# Patient Record
Sex: Male | Born: 1950 | Race: Black or African American | Hispanic: No | Marital: Single | Smoking: Former smoker
Health system: Southern US, Community
[De-identification: ages and names within clinical notes are randomized; demographics above are authoritative.]

## PROBLEM LIST (undated history)

## (undated) DIAGNOSIS — K759 Inflammatory liver disease, unspecified: Secondary | ICD-10-CM

## (undated) DIAGNOSIS — H269 Unspecified cataract: Secondary | ICD-10-CM

## (undated) DIAGNOSIS — F329 Major depressive disorder, single episode, unspecified: Secondary | ICD-10-CM

## (undated) DIAGNOSIS — F419 Anxiety disorder, unspecified: Secondary | ICD-10-CM

## (undated) DIAGNOSIS — K7460 Unspecified cirrhosis of liver: Secondary | ICD-10-CM

## (undated) DIAGNOSIS — H547 Unspecified visual loss: Secondary | ICD-10-CM

## (undated) DIAGNOSIS — F1010 Alcohol abuse, uncomplicated: Secondary | ICD-10-CM

## (undated) DIAGNOSIS — R569 Unspecified convulsions: Secondary | ICD-10-CM

## (undated) DIAGNOSIS — F32A Depression, unspecified: Secondary | ICD-10-CM

## (undated) DIAGNOSIS — I10 Essential (primary) hypertension: Secondary | ICD-10-CM

## (undated) DIAGNOSIS — T7840XA Allergy, unspecified, initial encounter: Secondary | ICD-10-CM

## (undated) DIAGNOSIS — H409 Unspecified glaucoma: Secondary | ICD-10-CM

## (undated) DIAGNOSIS — M1990 Unspecified osteoarthritis, unspecified site: Secondary | ICD-10-CM

## (undated) DIAGNOSIS — D649 Anemia, unspecified: Secondary | ICD-10-CM

## (undated) DIAGNOSIS — H5440 Blindness, one eye, unspecified eye: Secondary | ICD-10-CM

## (undated) HISTORY — PX: GLAUCOMA SURGERY: SHX656

## (undated) HISTORY — DX: Depression, unspecified: F32.A

## (undated) HISTORY — DX: Anxiety disorder, unspecified: F41.9

## (undated) HISTORY — DX: Unspecified convulsions: R56.9

## (undated) HISTORY — DX: Anemia, unspecified: D64.9

## (undated) HISTORY — DX: Allergy, unspecified, initial encounter: T78.40XA

## (undated) HISTORY — DX: Unspecified glaucoma: H40.9

## (undated) HISTORY — DX: Unspecified visual loss: H54.7

## (undated) HISTORY — PX: CATARACT EXTRACTION: SUR2

## (undated) HISTORY — DX: Unspecified osteoarthritis, unspecified site: M19.90

## (undated) HISTORY — DX: Unspecified cataract: H26.9

---
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
## 2014-12-09 ENCOUNTER — Other Ambulatory Visit: Payer: No Typology Code available for payment source

## 2014-12-09 DIAGNOSIS — B182 Chronic viral hepatitis C: Secondary | ICD-10-CM

## 2014-12-09 LAB — HEPATITIS A ANTIBODY, TOTAL: HEP A TOTAL AB: REACTIVE — AB

## 2014-12-09 LAB — CBC WITH DIFFERENTIAL/PLATELET
Basophils Absolute: 0 10*3/uL (ref 0.0–0.1)
Basophils Relative: 1 % (ref 0–1)
EOS PCT: 3 % (ref 0–5)
Eosinophils Absolute: 0.1 10*3/uL (ref 0.0–0.7)
HCT: 40.9 % (ref 39.0–52.0)
HEMOGLOBIN: 13.8 g/dL (ref 13.0–17.0)
LYMPHS PCT: 53 % — AB (ref 12–46)
Lymphs Abs: 2.5 10*3/uL (ref 0.7–4.0)
MCH: 29 pg (ref 26.0–34.0)
MCHC: 33.7 g/dL (ref 30.0–36.0)
MCV: 85.9 fL (ref 78.0–100.0)
MONO ABS: 0.6 10*3/uL (ref 0.1–1.0)
MPV: 10.8 fL (ref 9.4–12.4)
Monocytes Relative: 12 % (ref 3–12)
Neutro Abs: 1.5 10*3/uL — ABNORMAL LOW (ref 1.7–7.7)
Neutrophils Relative %: 31 % — ABNORMAL LOW (ref 43–77)
Platelets: 205 10*3/uL (ref 150–400)
RBC: 4.76 MIL/uL (ref 4.22–5.81)
RDW: 14.1 % (ref 11.5–15.5)
WBC: 4.7 10*3/uL (ref 4.0–10.5)

## 2014-12-09 LAB — COMPREHENSIVE METABOLIC PANEL
ALBUMIN: 4 g/dL (ref 3.5–5.2)
ALT: 186 U/L — ABNORMAL HIGH (ref 0–53)
AST: 137 U/L — ABNORMAL HIGH (ref 0–37)
Alkaline Phosphatase: 81 U/L (ref 39–117)
BUN: 9 mg/dL (ref 6–23)
CHLORIDE: 104 meq/L (ref 96–112)
CO2: 28 mEq/L (ref 19–32)
CREATININE: 1.01 mg/dL (ref 0.50–1.35)
Calcium: 9.5 mg/dL (ref 8.4–10.5)
Glucose, Bld: 100 mg/dL — ABNORMAL HIGH (ref 70–99)
Potassium: 4 mEq/L (ref 3.5–5.3)
Sodium: 140 mEq/L (ref 135–145)
Total Bilirubin: 0.5 mg/dL (ref 0.2–1.2)
Total Protein: 7.1 g/dL (ref 6.0–8.3)

## 2014-12-09 LAB — HIV ANTIBODY (ROUTINE TESTING W REFLEX): HIV 1&2 Ab, 4th Generation: NONREACTIVE

## 2014-12-09 LAB — HEPATITIS B SURFACE ANTIGEN: Hepatitis B Surface Ag: NEGATIVE

## 2014-12-09 LAB — HEPATITIS B CORE ANTIBODY, TOTAL: Hep B Core Total Ab: REACTIVE — AB

## 2014-12-09 LAB — HEPATITIS B SURFACE ANTIBODY,QUALITATIVE: Hep B S Ab: NEGATIVE

## 2014-12-09 LAB — PROTIME-INR
INR: 1.04 (ref <1.50)
PROTHROMBIN TIME: 13.6 s (ref 11.6–15.2)

## 2014-12-09 LAB — IRON: Iron: 157 ug/dL (ref 42–165)

## 2014-12-10 LAB — ANA: Anti Nuclear Antibody(ANA): NEGATIVE

## 2015-01-05 ENCOUNTER — Ambulatory Visit (INDEPENDENT_AMBULATORY_CARE_PROVIDER_SITE_OTHER): Payer: No Typology Code available for payment source | Admitting: Internal Medicine

## 2015-01-05 VITALS — BP 199/96 | HR 82 | Temp 98.0°F | Wt 170.0 lb

## 2015-01-05 DIAGNOSIS — B182 Chronic viral hepatitis C: Secondary | ICD-10-CM

## 2015-01-05 DIAGNOSIS — I10 Essential (primary) hypertension: Secondary | ICD-10-CM | POA: Insufficient documentation

## 2015-01-05 NOTE — Progress Notes (Addendum)
+  Jeffrey Mullen is a 64 y.o. male who presents for initial evaluation and management of a positive Hepatitis C antibody test.  Patient tested positive in 2006. Hepatitis C risk factors present are: sexual contact with person with liver disease (details: "risky" sexual behavior in teens and 42s). Patient denies intranasal drug use, IV drug abuse, tattoos. Patient has had other studies performed. Results: hepatitis C RNA by PCR, result: positive. Patient has not had prior treatment for Hepatitis C. Patient does not have a past history of liver disease. Patient does not have a family history of liver disease.   HPI: He has no liver issues now.  Had labs done by [facility] for genotype and viral load and paper copy reviewed.   Patient does have documented immunity to Hepatitis A. Patient does have documented immunity to Hepatitis B.     Review of Systems A comprehensive review of systems was negative.   No past medical history on file.  Prior to Admission medications   Medication Sig Start Date End Date Taking? Authorizing Provider  amLODipine (NORVASC) 10 MG tablet Take 10 mg by mouth daily.   Yes Historical Provider, MD  lisinopril-hydrochlorothiazide (PRINZIDE,ZESTORETIC) 20-12.5 MG per tablet Take 1 tablet by mouth daily.   Yes Historical Provider, MD    Allergies  Allergen Reactions  . Acetaminophen Other (See Comments)    Causes eye pressure to elevate    History  Substance Use Topics  . Smoking status: Not on file  . Smokeless tobacco: Not on file  . Alcohol Use: Not on file    No family history on file.    Objective:   Filed Vitals:   01/05/15 1505  BP: 199/96  Pulse: 82  Temp: 98 F (36.7 C)   in no apparent distress and alert HEENT: anicteric Cor RRR and No murmurs clear Bowel sounds are normal, liver is not enlarged, spleen is not enlarged peripheral pulses normal, no pedal edema, no clubbing or cyanosis negative for - jaundice, spider hemangioma, telangiectasia,  palmar erythema, ecchymosis and atrophy  Laboratory Genotype: No results found for: HCVGENOTYPE HCV viral load: No results found for: HCVQUANT Lab Results  Component Value Date   WBC 4.7 12/09/2014   HGB 13.8 12/09/2014   HCT 40.9 12/09/2014   MCV 85.9 12/09/2014   PLT 205 12/09/2014    Lab Results  Component Value Date   CREATININE 1.01 12/09/2014   BUN 9 12/09/2014   NA 140 12/09/2014   K 4.0 12/09/2014   CL 104 12/09/2014   CO2 28 12/09/2014    Lab Results  Component Value Date   ALT 186* 12/09/2014   AST 137* 12/09/2014   ALKPHOS 81 12/09/2014   BILITOT 0.5 12/09/2014   INR 1.04 12/09/2014      Assessment: Hepatitis C genotype 1a  Plan: 1) Patient counseled extensively on limiting acetaminophen to no more than 2 grams daily, avoidance of alcohol. 2) Transmission discussed with patient including sexual transmission, sharing razors and toothbrush.   3) Will need referral to gastroenterology if concern for cirrhosis 4) Will need referral for substance abuse counseling: No. 5) Will prescribe Harvoni for 12 weeks once work up complete 6) Hepatitis A vaccine No. 7) Hepatitis B vaccine No. 8) Pneumovax vaccine if concern for cirrhosis 9) will follow up after elastography

## 2015-01-05 NOTE — Patient Instructions (Signed)
Date 01/05/2015  Dear Mr Vermeer, As discussed in the [facility], your hepatitis C therapy will include the following medications:          Harvoni 90mg /400mg  tablet:           Take 1 tablet by mouth once daily   Please note that ALL MEDICATIONS WILL START ON THE SAME DATE for a total of 12 weeks. ---------------------------------------------------------------- Your HCV Treatment Start Date: TBA   Your HCV genotype:  1a    Liver Fibrosis: TBD    ---------------------------------------------------------------- YOUR PHARMACY CONTACT:   [place] [facility] [place] Phone: [phone] Hours: Monday to Friday 7:30 am to 6:00 pm   Please always contact your pharmacy at least 3-4 business days before you run out of medications to ensure your next month's medication is ready or 1 week prior to running out if you receive it by mail.  Remember, each prescription is for 28 days. ---------------------------------------------------------------- GENERAL NOTES REGARDING YOUR HEPATITIS C MEDICATION:  SOFOSBUVIR/LEDIPASVIR (HARVONI): - Harvoni tablet is taken daily with OR without food. - The tablets are orange. - The tablets should be stored at room temperature.  - Acid reducing agents such as H2 blockers (ie. Pepcid (famotidine), Zantac (ranitidine), Tagamet (cimetidine), Axid (nizatidine) and proton pump inhibitors (ie. Prilosec (omeprazole), Protonix (pantoprazole), Nexium (esomeprazole), or Aciphex (rabeprazole)) can decrease effectiveness of Harvoni. Do not take until you have discussed with a health care provider.    -Antacids that contain magnesium and/or aluminum hydroxide (ie. Milk of Magensia, Rolaids, Gaviscon, Maalox, Mylanta, an dArthritis Pain Formula)can reduce absorption of Harvoni, so take them at least 4 hours before or after Harvoni.  -Calcium carbonate (calcium supplements or antacids such as Tums, Caltrate,  Os-Cal)needs to be taken at least 4 hours hours before or after Harvoni.  -St. John's wort or any products that contain St. John's wort like some herbal supplements  Please inform the office prior to starting any of these medications.  - The common side effects with Harvoni:      1. Fatigue      2. Headache      3. Nausea      4. Diarrhea      5. Insomnia   Support Path is a suite of resources designed to help patients start with HARVONI and move toward treatment completion [place] helps patients access therapy and get off to an efficient start  Benefits investigation and prior authorization support Co-pay and other financial assistance A specialty pharmacy finder CO-PAY COUPON The [place] co-pay coupon may help eligible patients lower their out-of-pocket costs. With a co-pay coupon, most eligible patients may pay no more than $5 per co-pay (restrictions apply) www.harvoni.com call 619-298-9372 Not valid for patients enrolled in government healthcare prescription drug programs, such as Medicare Part D and Medicaid. Patients in the coverage gap known as the "donut hole" also are not eligible The HARVONI co-pay coupon program will cover the out-of-pocket costs for HARVONI prescriptions up to a maximum of 25% of the catalog price of a 12-week regimen of HARVONI  Please note that this only lists the most common side effects and is NOT a comprehensive list of the potential side effects of these medications. For more information, please review the drug information sheets that come with your medication package from the pharmacy.  ---------------------------------------------------------------- GENERAL HELPFUL HINTS ON HCV THERAPY: 1. No alcohol. 2. Protect against sun-sensitivity/sunburns (wear sunglasses, hat, long sleeves, pants  and sunscreen). 3. Stay well-hydrated/well-moisturized. 4. Notify the [facility] of any changes in your other over-the-counter/herbal or  prescription medications. 5. If you miss a dose of your medication, take the missed dose as soon as you remember. Return to your regular time/dose schedule the next day.  6.  Do not stop taking your medications without first talking with your healthcare provider. 7.  You may take Tylenol (acetaminophen), as long as the dose is less than 2000 mg (OR no more than 4 tablets of the Tylenol Extra Strengths 500mg  tablet) in 24 hours. 8.  You will need to obtain routine labs and/or office visits at [facility] at weeks 2, 4, 8,  and 12 as well as 12 and 24 weeks after completion of treatment.   Scharlene Gloss, [place] for [place] [place], [place]  [phone]

## 2015-01-24 ENCOUNTER — Ambulatory Visit (HOSPITAL_COMMUNITY): Payer: No Typology Code available for payment source

## 2015-02-01 ENCOUNTER — Other Ambulatory Visit: Payer: Self-pay | Admitting: Internal Medicine

## 2015-02-01 ENCOUNTER — Ambulatory Visit (HOSPITAL_COMMUNITY)
Admission: RE | Admit: 2015-02-01 | Discharge: 2015-02-01 | Disposition: A | Discharge status: Home / Self Care | Payer: No Typology Code available for payment source | Source: Ambulatory Visit | Attending: Internal Medicine | Admitting: Internal Medicine

## 2015-02-01 DIAGNOSIS — K7689 Other specified diseases of liver: Secondary | ICD-10-CM | POA: Insufficient documentation

## 2015-02-01 DIAGNOSIS — B182 Chronic viral hepatitis C: Secondary | ICD-10-CM | POA: Insufficient documentation

## 2015-02-01 MED ORDER — LEDIPASVIR-SOFOSBUVIR 90-400 MG PO TABS: 1.0000 | ORAL_TABLET | Freq: Every day | ORAL | Status: DC

## 2015-02-10 ENCOUNTER — Encounter: Payer: Self-pay | Admitting: Internal Medicine

## 2015-02-10 ENCOUNTER — Ambulatory Visit (INDEPENDENT_AMBULATORY_CARE_PROVIDER_SITE_OTHER): Payer: No Typology Code available for payment source | Admitting: Internal Medicine

## 2015-02-10 VITALS — BP 135/78 | HR 89 | Temp 98.3°F | Wt 172.0 lb

## 2015-02-10 DIAGNOSIS — B182 Chronic viral hepatitis C: Secondary | ICD-10-CM

## 2015-02-10 DIAGNOSIS — K7460 Unspecified cirrhosis of liver: Secondary | ICD-10-CM

## 2015-02-10 NOTE — Progress Notes (Signed)
   Subjective:    Patient ID: Jeffrey Mullen, male    DOB: 1951/03/05, 64 y.o.   MRN: [id number]  HPI  he is here for follow-up of hepatitis C. He had labs done by his outpatient clinic and has genotype 1A with an active viral load. He recently had his elastography and is F3 to F4. Harvoni has been sent through the drug assistance program to the [facility]. Awaiting for approval.   Review of Systems  Constitutional: Negative for fatigue.  Gastrointestinal: Negative for nausea and diarrhea.  Skin: Negative for rash.  Neurological: Negative for dizziness and light-headedness.       Objective:   Physical Exam  Constitutional: He appears well-developed and well-nourished. No distress.  Eyes: No scleral icterus.  Cardiovascular: Normal rate, regular rhythm and normal heart sounds.   No murmur heard. Pulmonary/Chest: Effort normal and breath sounds normal. No respiratory distress. He has no wheezes.  Lymphadenopathy:    He has no cervical adenopathy.  Skin: No rash noted.          Assessment & Plan:

## 2015-02-10 NOTE — Assessment & Plan Note (Signed)
He will need an ultrasound every 6 months and we'll consider elastography in one year. Will need gastroenterology follow-up if he is able to get insurance the future.

## 2015-02-10 NOTE — Assessment & Plan Note (Signed)
I'm awaiting for approval for Harvoni. He will return once this has been approved

## 2015-02-15 ENCOUNTER — Telehealth: Payer: Self-pay | Admitting: *Deleted

## 2015-02-15 NOTE — Telephone Encounter (Signed)
Received a statement for Jeffrey Mullen.  He has the [employer].  I called [facility] and had the charges removed and sent back here.

## 2015-03-31 ENCOUNTER — Ambulatory Visit: Payer: No Typology Code available for payment source

## 2015-05-26 ENCOUNTER — Other Ambulatory Visit: Payer: Self-pay | Admitting: Internal Medicine

## 2015-05-26 ENCOUNTER — Other Ambulatory Visit: Payer: No Typology Code available for payment source

## 2015-05-26 DIAGNOSIS — B182 Chronic viral hepatitis C: Secondary | ICD-10-CM

## 2015-05-31 LAB — HCV RNA NS5A DRUG RESISTANCE

## 2015-05-31 LAB — HEPATITIS C RNA QUANTITATIVE
HCV QUANT LOG: 6.74 {Log} — AB (ref <1.18)
HCV Quantitative: 5482903 IU/mL — ABNORMAL HIGH (ref <15)

## 2015-06-01 ENCOUNTER — Encounter: Payer: Self-pay | Admitting: Pharmacist Clinician (PhC)/ Clinical Pharmacy Specialist

## 2015-06-01 NOTE — Progress Notes (Signed)
Patient ID: Jeffrey Mullen, male   DOB: [birth year], 64 y.o.   MRN: [id number] Jeffrey Mullen stopped by today to sign his application for the hep C meds. We are going to try [employer] for [employer]. He is NS5A neg so he would only need the 12 weeks. App was submitted today.

## 2015-06-02 ENCOUNTER — Encounter: Payer: Self-pay | Admitting: Pharmacist

## 2015-06-02 NOTE — Progress Notes (Signed)
Patient ID: Jeffrey Mullen, male   DOB: [birth year], 64 y.o.   MRN: [id number]  Called the patient to let him know that he was approved for Harvoni. Discussed with his how to take the medication (once daily) and the importance of not missing any doses. Also reviewed side effects including headache and fatigue. Patient verbalized understanding and expressed great thanks. Patient was informed that he would be getting a phone call from the company and will receive the medication shortly after that. He is to start as soon as he receives his medication and will follow up for labs and in clinic per Dr. Linus Salmons.   Thank you for allowing pharmacy to be part of this patient's care team  Shalen Petrak M. Valeria Krisko, Pharm.D Clinical Pharmacy Resident Pager: [phone] 06/02/2015 .2:39 PM

## 2015-06-03 LAB — HEPATITIS C GENOTYPE

## 2016-08-13 ENCOUNTER — Encounter: Payer: Self-pay | Admitting: Pediatric Intensive Care

## 2016-08-13 DIAGNOSIS — Z139 Encounter for screening, unspecified: Secondary | ICD-10-CM

## 2016-08-20 NOTE — Congregational Nurse Program (Signed)
Congregational Nurse Program Note  Date of Encounter: 08/13/2016  Past Medical History: No past medical history on file.  Encounter Details:     CNP Questionnaire - 08/13/16 0800      Patient Demographics   Is this a new or existing patient? New   Patient is considered a/an Not Applicable   Race African-American/Black     Patient Assistance   Location of Patient Assistance [place]   Patient's financial/insurance status [employer]/Care Connects   Uninsured Patient Yes   Interventions Not Applicable   Patient referred to apply for the following financial assistance Not Applicable   Food insecurities addressed Not Applicable   Transportation assistance No   Assistance securing medications No   Educational health offerings Chronic disease;Hypertension;Medications     Encounter Details   Primary purpose of visit Chronic Illness/Condition Visit   Was an Emergency Department visit averted? Not Applicable   Does patient have a medical provider? Yes   Patient referred to Follow up with established PCP   Was a mental health screening completed? (GAINS tool) No   Does patient have dental issues? No   Does patient have vision issues? Yes   Was a vision referral made? No   Does your patient have an abnormal blood pressure today? No   Since previous encounter, have you referred patient for abnormal blood pressure that resulted in a new diagnosis or medication change? No   Does your patient have an abnormal blood glucose today? No   Since previous encounter, have you referred patient for abnormal blood glucose that resulted in a new diagnosis or medication change? No   Was there a life-saving intervention made? No     S:Client to clinic for BP check. States that he has been taking BP meds more consistently since he's been at facility and his life has stabilized. O: BP142/73 A: Normal BP P:advised client to keep follow up appointment with [facility] provider and to continue to take his  medication consistently. Follow up with CN for BP checks as needed.

## 2018-06-22 ENCOUNTER — Inpatient Hospital Stay (HOSPITAL_COMMUNITY)
Admission: EM | Admit: 2018-06-22 | Discharge: 2018-07-03 | DRG: 084 | Disposition: A | Discharge status: Home / Self Care | Payer: No Typology Code available for payment source | Attending: General Surgery | Admitting: General Surgery

## 2018-06-22 ENCOUNTER — Emergency Department (HOSPITAL_COMMUNITY): Payer: No Typology Code available for payment source

## 2018-06-22 ENCOUNTER — Encounter (HOSPITAL_COMMUNITY): Payer: Self-pay | Admitting: Emergency Medicine

## 2018-06-22 DIAGNOSIS — S0990XA Unspecified injury of head, initial encounter: Secondary | ICD-10-CM

## 2018-06-22 DIAGNOSIS — Z720 Tobacco use: Secondary | ICD-10-CM

## 2018-06-22 DIAGNOSIS — M25511 Pain in right shoulder: Secondary | ICD-10-CM | POA: Diagnosis present

## 2018-06-22 DIAGNOSIS — S065X9A Traumatic subdural hemorrhage with loss of consciousness of unspecified duration, initial encounter: Principal | ICD-10-CM | POA: Diagnosis present

## 2018-06-22 DIAGNOSIS — Q150 Congenital glaucoma: Secondary | ICD-10-CM

## 2018-06-22 DIAGNOSIS — I10 Essential (primary) hypertension: Secondary | ICD-10-CM

## 2018-06-22 DIAGNOSIS — R402140 Coma scale, eyes open, spontaneous, unspecified time: Secondary | ICD-10-CM | POA: Diagnosis present

## 2018-06-22 DIAGNOSIS — S02630A Fracture of coronoid process of mandible, unspecified side, initial encounter for closed fracture: Secondary | ICD-10-CM | POA: Diagnosis present

## 2018-06-22 DIAGNOSIS — R402360 Coma scale, best motor response, obeys commands, unspecified time: Secondary | ICD-10-CM | POA: Diagnosis present

## 2018-06-22 DIAGNOSIS — Z886 Allergy status to analgesic agent status: Secondary | ICD-10-CM

## 2018-06-22 DIAGNOSIS — F1010 Alcohol abuse, uncomplicated: Secondary | ICD-10-CM

## 2018-06-22 DIAGNOSIS — H5462 Unqualified visual loss, left eye, normal vision right eye: Secondary | ICD-10-CM | POA: Diagnosis present

## 2018-06-22 DIAGNOSIS — F17210 Nicotine dependence, cigarettes, uncomplicated: Secondary | ICD-10-CM | POA: Diagnosis present

## 2018-06-22 DIAGNOSIS — Z79899 Other long term (current) drug therapy: Secondary | ICD-10-CM

## 2018-06-22 DIAGNOSIS — S069X9A Unspecified intracranial injury with loss of consciousness of unspecified duration, initial encounter: Secondary | ICD-10-CM

## 2018-06-22 DIAGNOSIS — Y9355 Activity, bike riding: Secondary | ICD-10-CM

## 2018-06-22 DIAGNOSIS — K7460 Unspecified cirrhosis of liver: Secondary | ICD-10-CM | POA: Diagnosis present

## 2018-06-22 DIAGNOSIS — S065XAA Traumatic subdural hemorrhage with loss of consciousness status unknown, initial encounter: Secondary | ICD-10-CM

## 2018-06-22 DIAGNOSIS — I629 Nontraumatic intracranial hemorrhage, unspecified: Secondary | ICD-10-CM

## 2018-06-22 DIAGNOSIS — B182 Chronic viral hepatitis C: Secondary | ICD-10-CM | POA: Diagnosis present

## 2018-06-22 DIAGNOSIS — S0240CA Maxillary fracture, right side, initial encounter for closed fracture: Secondary | ICD-10-CM | POA: Diagnosis present

## 2018-06-22 DIAGNOSIS — S020XXA Fracture of vault of skull, initial encounter for closed fracture: Secondary | ICD-10-CM | POA: Diagnosis present

## 2018-06-22 DIAGNOSIS — S0232XA Fracture of orbital floor, left side, initial encounter for closed fracture: Secondary | ICD-10-CM | POA: Diagnosis present

## 2018-06-22 DIAGNOSIS — Z23 Encounter for immunization: Secondary | ICD-10-CM

## 2018-06-22 DIAGNOSIS — Z590 Homelessness: Secondary | ICD-10-CM

## 2018-06-22 DIAGNOSIS — Y906 Blood alcohol level of 120-199 mg/100 ml: Secondary | ICD-10-CM | POA: Diagnosis present

## 2018-06-22 DIAGNOSIS — R402240 Coma scale, best verbal response, confused conversation, unspecified time: Secondary | ICD-10-CM | POA: Diagnosis present

## 2018-06-22 DIAGNOSIS — H5440 Blindness, one eye, unspecified eye: Secondary | ICD-10-CM

## 2018-06-22 HISTORY — DX: Unspecified cirrhosis of liver: K74.60

## 2018-06-22 HISTORY — DX: Alcohol abuse, uncomplicated: F10.10

## 2018-06-22 HISTORY — DX: Blindness, one eye, unspecified eye: H54.40

## 2018-06-22 HISTORY — DX: Inflammatory liver disease, unspecified: K75.9

## 2018-06-22 HISTORY — DX: Essential (primary) hypertension: I10

## 2018-06-22 LAB — COMPREHENSIVE METABOLIC PANEL
ALBUMIN: 4.3 g/dL (ref 3.5–5.0)
ALK PHOS: 87 U/L (ref 38–126)
ALT: 26 U/L (ref 0–44)
ANION GAP: 15 (ref 5–15)
AST: 42 U/L — ABNORMAL HIGH (ref 15–41)
BILIRUBIN TOTAL: 0.7 mg/dL (ref 0.3–1.2)
BUN: 12 mg/dL (ref 8–23)
CALCIUM: 9.9 mg/dL (ref 8.9–10.3)
CO2: 27 mmol/L (ref 22–32)
Chloride: 100 mmol/L (ref 98–111)
Creatinine, Ser: 1 mg/dL (ref 0.61–1.24)
GFR calc Af Amer: >60 mL/min (ref >60)
GLUCOSE: 116 mg/dL — AB (ref 70–99)
POTASSIUM: 3.3 mmol/L — AB (ref 3.5–5.1)
Sodium: 142 mmol/L (ref 135–145)
TOTAL PROTEIN: 8.6 g/dL — AB (ref 6.5–8.1)

## 2018-06-22 LAB — I-STAT CHEM 8, ED
BUN: 14 mg/dL (ref 8–23)
CALCIUM ION: 1.12 mmol/L — AB (ref 1.15–1.40)
CHLORIDE: 100 mmol/L (ref 98–111)
CREATININE: 1.1 mg/dL (ref 0.61–1.24)
GLUCOSE: 115 mg/dL — AB (ref 70–99)
HCT: 46 % (ref 39.0–52.0)
Hemoglobin: 15.6 g/dL (ref 13.0–17.0)
POTASSIUM: 3.3 mmol/L — AB (ref 3.5–5.1)
Sodium: 143 mmol/L (ref 135–145)
TCO2: 26 mmol/L (ref 22–32)

## 2018-06-22 LAB — CBC WITH DIFFERENTIAL/PLATELET
Abs Immature Granulocytes: 0 10*3/uL (ref 0.0–0.1)
Basophils Absolute: 0 10*3/uL (ref 0.0–0.1)
Basophils Relative: 0 %
EOS ABS: 0 10*3/uL (ref 0.0–0.7)
EOS PCT: 0 %
HEMATOCRIT: 43.7 % (ref 39.0–52.0)
Hemoglobin: 14.2 g/dL (ref 13.0–17.0)
Immature Granulocytes: 0 %
LYMPHS ABS: 2.3 10*3/uL (ref 0.7–4.0)
Lymphocytes Relative: 28 %
MCH: 27.8 pg (ref 26.0–34.0)
MCHC: 32.5 g/dL (ref 30.0–36.0)
MCV: 85.5 fL (ref 78.0–100.0)
MONO ABS: 1.7 10*3/uL — AB (ref 0.1–1.0)
MONOS PCT: 20 %
Neutro Abs: 4.2 10*3/uL (ref 1.7–7.7)
Neutrophils Relative %: 52 %
Platelets: 211 10*3/uL (ref 150–400)
RBC: 5.11 MIL/uL (ref 4.22–5.81)
RDW: 14.4 % (ref 11.5–15.5)
WBC: 8.2 10*3/uL (ref 4.0–10.5)

## 2018-06-22 LAB — ETHANOL: Alcohol, Ethyl (B): 160 mg/dL — ABNORMAL HIGH (ref <10)

## 2018-06-22 LAB — MRSA PCR SCREENING: MRSA BY PCR: NEGATIVE

## 2018-06-22 MED ORDER — LORAZEPAM 1 MG PO TABS
0.0000 mg | ORAL_TABLET | Freq: Four times a day (QID) | ORAL | Status: AC
  Administered 2018-06-24: 1 mg via ORAL
  Administered 2018-06-24 (×2): 0 mg via ORAL
  Filled 2018-06-22: qty 1

## 2018-06-22 MED ORDER — FOLIC ACID 1 MG PO TABS
1.0000 mg | ORAL_TABLET | Freq: Every day | ORAL | Status: DC
  Administered 2018-06-22 – 2018-07-03 (×12): 1 mg via ORAL
  Filled 2018-06-22 (×12): qty 1

## 2018-06-22 MED ORDER — ATROPINE SULFATE 1 % OP SOLN
1.0000 [drp] | Freq: Two times a day (BID) | OPHTHALMIC | Status: DC
  Administered 2018-06-22 – 2018-07-03 (×21): 1 [drp] via OPHTHALMIC
  Filled 2018-06-22 (×2): qty 2

## 2018-06-22 MED ORDER — HYDRALAZINE HCL 20 MG/ML IJ SOLN
10.0000 mg | INTRAMUSCULAR | Status: DC | PRN
  Administered 2018-06-22 – 2018-06-25 (×2): 10 mg via INTRAVENOUS
  Filled 2018-06-22 (×2): qty 1

## 2018-06-22 MED ORDER — SODIUM CHLORIDE 0.9 % IV SOLN
INTRAVENOUS | Status: DC
  Administered 2018-06-22 – 2018-06-27 (×6): via INTRAVENOUS

## 2018-06-22 MED ORDER — HYDROCHLOROTHIAZIDE 12.5 MG PO CAPS
12.5000 mg | ORAL_CAPSULE | Freq: Every day | ORAL | Status: DC
  Administered 2018-06-22 – 2018-07-03 (×12): 12.5 mg via ORAL
  Filled 2018-06-22 (×12): qty 1

## 2018-06-22 MED ORDER — TRAMADOL HCL 50 MG PO TABS
50.0000 mg | ORAL_TABLET | Freq: Four times a day (QID) | ORAL | Status: DC
  Administered 2018-06-22 – 2018-07-03 (×39): 50 mg via ORAL
  Filled 2018-06-22 (×39): qty 1

## 2018-06-22 MED ORDER — HYDROMORPHONE HCL 1 MG/ML IJ SOLN: 1.0000 mg | INTRAMUSCULAR | Status: DC | PRN

## 2018-06-22 MED ORDER — HYDROMORPHONE HCL 1 MG/ML IJ SOLN
0.5000 mg | Freq: Once | INTRAMUSCULAR | Status: AC
  Administered 2018-06-22: 0.5 mg via INTRAVENOUS
  Filled 2018-06-22: qty 1

## 2018-06-22 MED ORDER — ADULT MULTIVITAMIN W/MINERALS CH
1.0000 | ORAL_TABLET | Freq: Every day | ORAL | Status: DC
  Administered 2018-06-22 – 2018-07-03 (×12): 1 via ORAL
  Filled 2018-06-22 (×12): qty 1

## 2018-06-22 MED ORDER — PANTOPRAZOLE SODIUM 40 MG PO TBEC
40.0000 mg | DELAYED_RELEASE_TABLET | Freq: Every day | ORAL | Status: DC
  Administered 2018-06-22 – 2018-07-03 (×12): 40 mg via ORAL
  Filled 2018-06-22 (×12): qty 1

## 2018-06-22 MED ORDER — OXYCODONE HCL 5 MG PO TABS
5.0000 mg | ORAL_TABLET | ORAL | Status: DC | PRN
  Administered 2018-06-22 – 2018-06-23 (×3): 10 mg via ORAL
  Administered 2018-06-24: 5 mg via ORAL
  Administered 2018-06-26 – 2018-06-27 (×2): 10 mg via ORAL
  Administered 2018-06-27: 5 mg via ORAL
  Administered 2018-06-28: 10 mg via ORAL
  Administered 2018-06-30 – 2018-07-01 (×2): 5 mg via ORAL
  Filled 2018-06-22: qty 2
  Filled 2018-06-22: qty 1
  Filled 2018-06-22: qty 2
  Filled 2018-06-22: qty 1
  Filled 2018-06-22: qty 2
  Filled 2018-06-22: qty 1
  Filled 2018-06-22 (×2): qty 2
  Filled 2018-06-22: qty 1
  Filled 2018-06-22: qty 2

## 2018-06-22 MED ORDER — VITAMIN B-1 100 MG PO TABS
100.0000 mg | ORAL_TABLET | Freq: Every day | ORAL | Status: DC
  Administered 2018-06-22 – 2018-07-03 (×11): 100 mg via ORAL
  Filled 2018-06-22 (×12): qty 1

## 2018-06-22 MED ORDER — LORAZEPAM 2 MG/ML IJ SOLN
1.0000 mg | Freq: Four times a day (QID) | INTRAMUSCULAR | Status: AC | PRN
  Administered 2018-06-24 (×2): 1 mg via INTRAVENOUS
  Filled 2018-06-22 (×2): qty 1

## 2018-06-22 MED ORDER — LISINOPRIL-HYDROCHLOROTHIAZIDE 20-12.5 MG PO TABS: 1.0000 | ORAL_TABLET | Freq: Every day | ORAL | Status: DC

## 2018-06-22 MED ORDER — THIAMINE HCL 100 MG/ML IJ SOLN
100.0000 mg | Freq: Every day | INTRAMUSCULAR | Status: DC
  Administered 2018-06-26: 100 mg via INTRAVENOUS
  Filled 2018-06-22 (×4): qty 2

## 2018-06-22 MED ORDER — LATANOPROST 0.005 % OP SOLN
1.0000 [drp] | Freq: Every day | OPHTHALMIC | Status: DC
  Administered 2018-06-22 – 2018-07-03 (×10): 1 [drp] via OPHTHALMIC
  Filled 2018-06-22 (×2): qty 2.5

## 2018-06-22 MED ORDER — AMLODIPINE BESYLATE 10 MG PO TABS
10.0000 mg | ORAL_TABLET | Freq: Every day | ORAL | Status: DC
  Administered 2018-06-23 – 2018-07-03 (×11): 10 mg via ORAL
  Filled 2018-06-22 (×11): qty 1

## 2018-06-22 MED ORDER — PREDNISOLONE ACETATE 1 % OP SUSP
1.0000 [drp] | Freq: Three times a day (TID) | OPHTHALMIC | Status: DC
  Administered 2018-06-22 – 2018-07-03 (×32): 1 [drp] via OPHTHALMIC
  Filled 2018-06-22 (×2): qty 5

## 2018-06-22 MED ORDER — LISINOPRIL 20 MG PO TABS
20.0000 mg | ORAL_TABLET | Freq: Every day | ORAL | Status: DC
  Administered 2018-06-22 – 2018-07-03 (×12): 20 mg via ORAL
  Filled 2018-06-22 (×12): qty 1

## 2018-06-22 MED ORDER — LORAZEPAM 1 MG PO TABS
0.0000 mg | ORAL_TABLET | Freq: Two times a day (BID) | ORAL | Status: AC
  Administered 2018-06-24: 1 mg via ORAL
  Administered 2018-06-25: 4 mg via ORAL
  Filled 2018-06-22: qty 4
  Filled 2018-06-22: qty 2

## 2018-06-22 MED ORDER — LORAZEPAM 1 MG PO TABS
1.0000 mg | ORAL_TABLET | Freq: Four times a day (QID) | ORAL | Status: AC | PRN
  Administered 2018-06-25: 1 mg via ORAL
  Filled 2018-06-22: qty 1
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
## 2018-06-23 ENCOUNTER — Inpatient Hospital Stay (HOSPITAL_COMMUNITY): Payer: No Typology Code available for payment source

## 2018-06-23 ENCOUNTER — Other Ambulatory Visit: Payer: Self-pay

## 2018-06-23 LAB — BASIC METABOLIC PANEL
Anion gap: 11 (ref 5–15)
BUN: 10 mg/dL (ref 8–23)
CO2: 29 mmol/L (ref 22–32)
Calcium: 9.5 mg/dL (ref 8.9–10.3)
Chloride: 101 mmol/L (ref 98–111)
Creatinine, Ser: 0.98 mg/dL (ref 0.61–1.24)
GFR calc Af Amer: >60 mL/min (ref >60)
GLUCOSE: 105 mg/dL — AB (ref 70–99)
POTASSIUM: 3.5 mmol/L (ref 3.5–5.1)
Sodium: 141 mmol/L (ref 135–145)

## 2018-06-23 LAB — CBC
HCT: 39.7 % (ref 39.0–52.0)
Hemoglobin: 13.2 g/dL (ref 13.0–17.0)
MCH: 28.2 pg (ref 26.0–34.0)
MCHC: 33.2 g/dL (ref 30.0–36.0)
MCV: 84.8 fL (ref 78.0–100.0)
PLATELETS: 204 10*3/uL (ref 150–400)
RBC: 4.68 MIL/uL (ref 4.22–5.81)
RDW: 14.2 % (ref 11.5–15.5)
WBC: 6.2 10*3/uL (ref 4.0–10.5)

## 2018-06-23 LAB — HIV ANTIBODY (ROUTINE TESTING W REFLEX): HIV Screen 4th Generation wRfx: NONREACTIVE

## 2018-06-23 MED ORDER — PNEUMOCOCCAL VAC POLYVALENT 25 MCG/0.5ML IJ INJ
0.5000 mL | INJECTION | INTRAMUSCULAR | Status: AC
  Administered 2018-06-24: 0.5 mL via INTRAMUSCULAR
  Filled 2018-06-23: qty 0.5
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
## 2018-06-23 NOTE — Plan of Care (Signed)
Patient with no issues throughout the night. Will continue to monitor.
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
## 2018-06-24 NOTE — Progress Notes (Signed)
OT Cancellation Note  Patient Details Name: Jeffrey Mullen MRN: [id number] DOB: 1951/08/24   Cancelled Treatment:    Reason Eval/Treat Not Completed: Other (comment): On OT arrival, RN reporting that pt highly agitated, complaining of headache, and just received Ativan. RN requesting OT to hold until pt calm. OT will check back as appropriate.   Norman Herrlich, MS OTR/L  Pager: [phone]   Norman Herrlich 06/24/2018, 8:43 AM
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
## 2018-06-24 NOTE — Progress Notes (Signed)
Overall stable.  Patient continues to have mild headache.  No other neurologic signs.  Awake and alert.  Oriented and appropriate.  Motor and sensory function intact.  Follow-up head CT scan yesterday with stable appearance of his left inferior frontal hemorrhagic contusion.  No evidence of significant mass-effect.  Overall doing well.  No evidence of significant elevated intracranial pressure.  No indication for any further imaging.  Patient may mobilize as tolerated.  Follow-up with me in a couple weeks.
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
## 2018-06-26 NOTE — Progress Notes (Signed)
Trauma Service Note  Subjective: Patient is awake, alert and cooperative today.  Says he is having pain in his chest  Objective: Vital signs in last 24 hours: Temp:  [97.7 F (36.5 C)-98.7 F (37.1 C)] 98.7 F (37.1 C) (07/04 0832) Pulse Rate:  [65-68] 65 (07/04 0800) Resp:  [15-20] 19 (07/04 0400) BP: (127-168)/(59-85) 127/59 (07/04 0800) SpO2:  [99 %] 99 % (07/03 1230) Last BM Date: 06/20/18  Intake/Output from previous day: 07/03 0701 - 07/04 0700 In: 220 [P.O.:220] Out: 1725 [Urine:1725] Intake/Output this shift: No intake/output data recorded.  General: No acute distress.  Still working with therapies.  Lungs: Clear  Abd: Benign  Extremities: No changes  Neuro: Intact, a bit confused.  Swelling around face much improved.  Lab Results: CBC  No results for input(s): WBC, HGB, HCT, PLT in the last 72 hours. BMET No results for input(s): NA, K, CL, CO2, GLUCOSE, BUN, CREATININE, CALCIUM in the last 72 hours. PT/INR No results for input(s): LABPROT, INR in the last 72 hours. ABG No results for input(s): PHART, HCO3 in the last 72 hours.  Invalid input(s): PCO2, PO2  Studies/Results: No results found.  Anti-infectives: Anti-infectives (From admission, onward)   None      Assessment/Plan: s/p  Keep on full liquids  LOS: 4 days   Kathryne Eriksson. Dahlia Bailiff, MD, FACS [phone] Trauma Surgeon 06/26/2018
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
## 2018-06-30 ENCOUNTER — Encounter (HOSPITAL_COMMUNITY): Payer: Self-pay | Admitting: Physical Medicine and Rehabilitation
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
## 2018-06-30 NOTE — Clinical Social Work Note (Deleted)
[facility] will take pt tomorrow--they have to order the Dilated drip. MD notified. Harmon Pier with [facility] will speak with the family and proceed with paperwork per families choice.  [place], [place]

## 2018-06-30 NOTE — Clinical Social Work Note (Addendum)
CSW spoke with [facility] and unfortunately they are not saving a space for pt to return after SNF. CSW contacted AD regarding the difficulty finding placement without a d/c plan following SNF. CSW will consult with Physician Advisor to determine expanding bed search.  [place], [place]

## 2018-06-30 NOTE — NC FL2 (Signed)
[place] LEVEL OF CARE SCREENING TOOL     IDENTIFICATION  Patient Name: Jeffrey Mullen Birthdate: 11-20-1951 Sex: male Admission Date (Current Location): 06/22/2018  [facility] and [place] Number:  Herbalist and Address:  [facility] [place], [place] [address], [place], [place] [address]      Provider Number: [id number]  Attending Physician Name and Address:  [place], Trauma, MD  Relative Name and Phone Number:       Current Level of Care: Hospital Recommended Level of Care: [place] Prior Approval Number:    Date Approved/Denied:   PASRR Number: [id number]  Discharge Plan: SNF    Current Diagnoses: Patient Active Problem List   Diagnosis Date Noted  . Intracranial hemorrhage ([place]) 06/22/2018  . Hepatic cirrhosis ([place]) 02/10/2015  . Chronic hepatitis C without hepatic coma ([place]) 01/05/2015  . HTN (hypertension) 01/05/2015    Orientation RESPIRATION BLADDER Height & Weight     Self, Situation, Place  Normal Continent Weight: 172 lb (78 kg) Height:  5\' 7"  (170.2 cm)  BEHAVIORAL SYMPTOMS/MOOD NEUROLOGICAL BOWEL NUTRITION STATUS      Incontinent Diet(Full liquid, thin liquids)  AMBULATORY STATUS COMMUNICATION OF NEEDS Skin   Limited Assist Verbally Normal                       Personal Care Assistance Level of Assistance  Bathing, Feeding, Dressing Bathing Assistance: Limited assistance Feeding assistance: Independent Dressing Assistance: Limited assistance     Functional Limitations Info  Sight, Hearing, Speech Sight Info: Adequate Hearing Info: Adequate Speech Info: Adequate    SPECIAL CARE FACTORS FREQUENCY  PT (By licensed PT), OT (By licensed OT)     PT Frequency: 2x OT Frequency: 2x            Contractures Contractures Info: Not present    Additional Factors Info  Code Status, Allergies Code Status Info: Full Code Allergies Info: Acetaminophen           Current Medications  (06/30/2018):  This is the current hospital active medication list Current Facility-Administered Medications  Medication Dose Route Frequency Provider Last Rate Last Dose  . amLODipine (NORVASC) tablet 10 mg  10 mg Oral Daily Judeth Horn, MD   10 mg at 06/29/18 0947  . atropine 1 % ophthalmic solution 1 drop  1 drop Left Eye BID Judeth Horn, MD   1 drop at 06/29/18 2219  . folic acid (FOLVITE) tablet 1 mg  1 mg Oral Daily Judeth Horn, MD   1 mg at 06/29/18 0946  . hydrALAZINE (APRESOLINE) injection 10 mg  10 mg Intravenous Q2H PRN Judeth Horn, MD   10 mg at 06/25/18 0400  . lisinopril (PRINIVIL,ZESTRIL) tablet 20 mg  20 mg Oral Daily Pierce, Dwayne A, RPH   20 mg at 06/29/18 0946   And  . hydrochlorothiazide (MICROZIDE) capsule 12.5 mg  12.5 mg Oral Daily Joselyn Glassman A, RPH   12.5 mg at 06/29/18 0946  . HYDROmorphone (DILAUDID) injection 1 mg  1 mg Intravenous Q4H PRN Judeth Horn, MD      . latanoprost (XALATAN) 0.005 % ophthalmic solution 1 drop  1 drop Right Eye Roylene Reason, MD   1 drop at 06/29/18 2220  . multivitamin with minerals tablet 1 tablet  1 tablet Oral Daily Judeth Horn, MD   1 tablet at 06/29/18 0946  . oxyCODONE (Oxy IR/ROXICODONE) immediate release tablet 5-10 mg  5-10 mg Oral Q4H  PRN Judeth Horn, MD   5 mg at 06/30/18 0817  . pantoprazole (PROTONIX) EC tablet 40 mg  40 mg Oral Daily Judeth Horn, MD   40 mg at 06/29/18 0946  . prednisoLONE acetate (PRED FORTE) 1 % ophthalmic suspension 1 drop  1 drop Left Eye TID Judeth Horn, MD   1 drop at 06/29/18 2219  . thiamine (VITAMIN B-1) tablet 100 mg  100 mg Oral Daily Judeth Horn, MD   100 mg at 06/29/18 0946   Or  . thiamine (B-1) injection 100 mg  100 mg Intravenous Daily Judeth Horn, MD   100 mg at 06/26/18 1024  . traMADol (ULTRAM) tablet 50 mg  50 mg Oral Q6H Judeth Horn, MD   50 mg at 06/30/18 0501     Discharge Medications: Please see discharge summary for a list of discharge medications.  Relevant Imaging  Results:  Relevant Lab Results:   Additional Information SSN: [id number]  Eileen Stanford, LCSW
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
## 2018-07-01 DIAGNOSIS — S0990XA Unspecified injury of head, initial encounter: Secondary | ICD-10-CM

## 2018-07-01 DIAGNOSIS — F1010 Alcohol abuse, uncomplicated: Secondary | ICD-10-CM

## 2018-07-01 DIAGNOSIS — H5440 Blindness, one eye, unspecified eye: Secondary | ICD-10-CM

## 2018-07-01 DIAGNOSIS — Z720 Tobacco use: Secondary | ICD-10-CM

## 2018-07-01 DIAGNOSIS — I629 Nontraumatic intracranial hemorrhage, unspecified: Secondary | ICD-10-CM

## 2018-07-01 DIAGNOSIS — I10 Essential (primary) hypertension: Secondary | ICD-10-CM

## 2018-07-01 DIAGNOSIS — S065X9A Traumatic subdural hemorrhage with loss of consciousness of unspecified duration, initial encounter: Principal | ICD-10-CM

## 2018-07-01 DIAGNOSIS — S069X9A Unspecified intracranial injury with loss of consciousness of unspecified duration, initial encounter: Secondary | ICD-10-CM

## 2018-07-01 DIAGNOSIS — S065XAA Traumatic subdural hemorrhage with loss of consciousness status unknown, initial encounter: Secondary | ICD-10-CM

## 2018-07-01 NOTE — Progress Notes (Signed)
Inpatient Rehabilitation Admissions Coordinator  Discussed case at [place] with RN CM and SW team. Pt no longer in need of an inpt rehab admission at current functional level. Plan is for possible LOG at assisted living or shelter discharge. We will sign off at this time.  Danne Baxter, RN, MSN Rehab Admissions Coordinator [phone] 07/01/2018 10:01 AM

## 2018-07-01 NOTE — Clinical Social Work Note (Signed)
Clinical Social Worker continuing to follow patient for support and discharge planning needs.  Patient has been cleared cognitively and is appropriate for return to [facility] contacted facility who states that patient should have his bed available tomorrow - MD updated.  CSW remains available for support and to assist with discharge planning needs.  Barbette Or, [place]
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
## 2018-07-02 MED ORDER — PREDNISOLONE ACETATE 1 % OP SUSP: 1.0000 [drp] | Freq: Three times a day (TID) | OPHTHALMIC | 1 refills | Status: DC

## 2018-07-02 MED ORDER — TRAMADOL HCL 50 MG PO TABS: 50.0000 mg | ORAL_TABLET | Freq: Four times a day (QID) | ORAL | 0 refills | Status: DC

## 2018-07-02 MED ORDER — ATROPINE SULFATE 1 % OP SOLN: 1.0000 [drp] | Freq: Two times a day (BID) | OPHTHALMIC | 1 refills | Status: DC

## 2018-07-02 MED ORDER — LISINOPRIL-HYDROCHLOROTHIAZIDE 20-12.5 MG PO TABS: 1.0000 | ORAL_TABLET | Freq: Every day | ORAL | 0 refills | Status: DC

## 2018-07-02 MED ORDER — AMLODIPINE BESYLATE 10 MG PO TABS: 10.0000 mg | ORAL_TABLET | Freq: Every day | ORAL | 0 refills | Status: DC

## 2018-07-02 MED ORDER — LATANOPROST 0.005 % OP SOLN: 1.0000 [drp] | Freq: Every day | OPHTHALMIC | 1 refills | Status: DC
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
## 2018-07-02 NOTE — Social Work (Addendum)
Pt bed is not available at [facility], cannot guarantee bed for tomorrow either. CSW will call back to [facility] in regards to pt items that he left at [facility].   Pt aware, states "I know they can't hold it forever, it's temporary housing." Pt states he would like to get his items from [place]. CSW will be able to provide transport to [place] after we touch base with [facility] staff at 12:30.   2:30pm- Pts items are at [facility], [place] at [place] is aware he is on his way back. Pt provided with cab voucher to make it back to [facility] with items he has here. Pt also has bus passes in order to make it to his follow up appointments. Pt disappointed they couldn't hold his bed but has hope and good outlook about discharge. All questions answered, this information was also communicated to trauma team.   [place] signing off. Please consult if any additional needs arise.  Alexander Mt, [place] Work [phone]
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
## 2018-07-03 NOTE — Discharge Instructions (Signed)
NO CHEWING until August 1st  1. PAIN CONTROL:  1. Pain is best controlled by a usual combination of three different methods TOGETHER:  1. Ice/Heat 2. Over the counter pain medication 3. Prescription pain medication 2. Most patients will experience some swelling and bruising. Ice packs or heating pads (30-60 minutes up to 6 times a day) will help. Use ice for the first few days to help decrease swelling and bruising, then switch to heat to help relax tight/sore spots and speed recovery. Some people prefer to use ice alone, heat alone, alternating between ice & heat. Experiment to what works for you. Swelling and bruising can take several weeks to resolve.  3. It is helpful to take an over-the-counter pain medication regularly for the first few weeks. Choose one of the following that works best for you:  1. Naproxen (Aleve, etc) Two 220mg  tabs twice a day 2. Ibuprofen (Advil, etc) Three 200mg  tabs four times a day (every meal & bedtime) 3. Acetaminophen (Tylenol, etc) 500-650mg  four times a day (every meal & bedtime) 4. A prescription for pain medication (such as oxycodone, hydrocodone, etc) should be given to you upon discharge. Take your pain medication as prescribed.  1. If you are having problems/concerns with the prescription medicine (does not control pain, nausea, vomiting, rash, itching, etc), please call us [phone] to see if we need to switch you to a different pain medicine that will work better for you and/or control your side effect better. 2. If you need a refill on your pain medication, please contact your pharmacy. They will contact our office to request authorization. Prescriptions will not be filled after 5 pm or on week-ends. 4. Avoid getting constipated. When taking pain medications, it is common to experience some constipation. Increasing fluid intake and taking a fiber supplement (such as Metamucil, Citrucel, FiberCon, MiraLax, etc) 1-2 times a day regularly will usually help  prevent this problem from occurring. A mild laxative (prune juice, Milk of Magnesia, MiraLax, etc) should be taken according to package directions if there are no bowel movements after 48 hours.  5. Watch out for diarrhea. If you have many loose bowel movements, simplify your diet to bland foods & liquids for a few days. Stop any stool softeners and decrease your fiber supplement. Switching to mild anti-diarrheal medications (Kayopectate, Pepto Bismol) can help. If this worsens or does not improve, please call us. 1. FOLLOW UP with ENT and neurosurgery  WHEN TO CALL us [phone]:  1. Poor pain control 2. Reactions / problems with new medications (rash/itching, nausea, etc)  3. Fever over 101.5 F (38.5 C) 4. Worsening swelling or bruising 5. Headache, visual changes, nausea vomiting or any other concerning symptoms  The clinic staff is available to answer your questions during regular business hours (8:30am-5pm). Please dont hesitate to call and ask to speak to one of our nurses for clinical concerns.  If you have a medical emergency, go to the nearest emergency room or call 911.  A surgeon from [facility] is always on call at the [facility], [place], [place], [place], [place] [address] ?  MAIN: [phone] ? TOLL FREE: [phone] ?  FAX [phone]  [email or web address]

## 2018-08-01 ENCOUNTER — Ambulatory Visit (INDEPENDENT_AMBULATORY_CARE_PROVIDER_SITE_OTHER): Payer: No Typology Code available for payment source | Admitting: Physician Assistant

## 2018-08-25 ENCOUNTER — Encounter (HOSPITAL_COMMUNITY): Payer: Self-pay

## 2018-08-25 ENCOUNTER — Observation Stay (HOSPITAL_COMMUNITY)
Admission: EM | Admit: 2018-08-25 | Discharge: 2018-08-26 | Disposition: A | Discharge status: Home / Self Care | Payer: No Typology Code available for payment source | Attending: Internal Medicine | Admitting: Internal Medicine

## 2018-08-25 ENCOUNTER — Other Ambulatory Visit: Payer: Self-pay

## 2018-08-25 ENCOUNTER — Emergency Department (HOSPITAL_COMMUNITY): Payer: No Typology Code available for payment source

## 2018-08-25 DIAGNOSIS — F1010 Alcohol abuse, uncomplicated: Secondary | ICD-10-CM | POA: Diagnosis present

## 2018-08-25 DIAGNOSIS — S0993XA Unspecified injury of face, initial encounter: Principal | ICD-10-CM | POA: Insufficient documentation

## 2018-08-25 DIAGNOSIS — S0990XA Unspecified injury of head, initial encounter: Secondary | ICD-10-CM | POA: Diagnosis present

## 2018-08-25 DIAGNOSIS — Y929 Unspecified place or not applicable: Secondary | ICD-10-CM | POA: Insufficient documentation

## 2018-08-25 DIAGNOSIS — X58XXXA Exposure to other specified factors, initial encounter: Secondary | ICD-10-CM | POA: Insufficient documentation

## 2018-08-25 DIAGNOSIS — F17210 Nicotine dependence, cigarettes, uncomplicated: Secondary | ICD-10-CM | POA: Insufficient documentation

## 2018-08-25 DIAGNOSIS — K7460 Unspecified cirrhosis of liver: Secondary | ICD-10-CM | POA: Diagnosis present

## 2018-08-25 DIAGNOSIS — R55 Syncope and collapse: Secondary | ICD-10-CM | POA: Diagnosis present

## 2018-08-25 DIAGNOSIS — I10 Essential (primary) hypertension: Secondary | ICD-10-CM | POA: Diagnosis present

## 2018-08-25 DIAGNOSIS — Z720 Tobacco use: Secondary | ICD-10-CM | POA: Diagnosis present

## 2018-08-25 DIAGNOSIS — Y939 Activity, unspecified: Secondary | ICD-10-CM | POA: Insufficient documentation

## 2018-08-25 DIAGNOSIS — B182 Chronic viral hepatitis C: Secondary | ICD-10-CM | POA: Diagnosis present

## 2018-08-25 DIAGNOSIS — Y999 Unspecified external cause status: Secondary | ICD-10-CM | POA: Insufficient documentation

## 2018-08-25 LAB — HEPATIC FUNCTION PANEL
ALK PHOS: 104 U/L (ref 38–126)
ALT: 22 U/L (ref 0–44)
AST: 43 U/L — AB (ref 15–41)
Albumin: 3.9 g/dL (ref 3.5–5.0)
BILIRUBIN DIRECT: 0.2 mg/dL (ref 0.0–0.2)
Indirect Bilirubin: 0.5 mg/dL (ref 0.3–0.9)
TOTAL PROTEIN: 7.3 g/dL (ref 6.5–8.1)
Total Bilirubin: 0.7 mg/dL (ref 0.3–1.2)

## 2018-08-25 LAB — URINALYSIS, ROUTINE W REFLEX MICROSCOPIC
Bilirubin Urine: NEGATIVE
GLUCOSE, UA: NEGATIVE mg/dL
KETONES UR: 5 mg/dL — AB
LEUKOCYTES UA: NEGATIVE
Nitrite: NEGATIVE
PH: 6 (ref 5.0–8.0)
PROTEIN: 30 mg/dL — AB
Specific Gravity, Urine: 1.01 (ref 1.005–1.030)

## 2018-08-25 LAB — CBC
HCT: 41.9 % (ref 39.0–52.0)
Hemoglobin: 13.9 g/dL (ref 13.0–17.0)
MCH: 29.2 pg (ref 26.0–34.0)
MCHC: 33.2 g/dL (ref 30.0–36.0)
MCV: 88 fL (ref 78.0–100.0)
PLATELETS: 272 10*3/uL (ref 150–400)
RBC: 4.76 MIL/uL (ref 4.22–5.81)
RDW: 15.1 % (ref 11.5–15.5)
WBC: 7.1 10*3/uL (ref 4.0–10.5)

## 2018-08-25 LAB — CBG MONITORING, ED: Glucose-Capillary: 163 mg/dL — ABNORMAL HIGH (ref 70–99)

## 2018-08-25 LAB — BASIC METABOLIC PANEL
Anion gap: 17 — ABNORMAL HIGH (ref 5–15)
BUN: 7 mg/dL — AB (ref 8–23)
CALCIUM: 8.8 mg/dL — AB (ref 8.9–10.3)
CO2: 20 mmol/L — ABNORMAL LOW (ref 22–32)
CREATININE: 1.13 mg/dL (ref 0.61–1.24)
Chloride: 105 mmol/L (ref 98–111)
GFR calc non Af Amer: >60 mL/min (ref >60)
Glucose, Bld: 166 mg/dL — ABNORMAL HIGH (ref 70–99)
Potassium: 4.8 mmol/L (ref 3.5–5.1)
SODIUM: 142 mmol/L (ref 135–145)

## 2018-08-25 LAB — ETHANOL

## 2018-08-25 LAB — TROPONIN I
Troponin I: <0.03 ng/mL (ref <0.03)
Troponin I: 0.08 ng/mL (ref <0.03)

## 2018-08-25 LAB — TSH: TSH: 0.639 u[IU]/mL (ref 0.350–4.500)

## 2018-08-25 LAB — MAGNESIUM: MAGNESIUM: 1.9 mg/dL (ref 1.7–2.4)

## 2018-08-25 MED ORDER — THIAMINE HCL 100 MG/ML IJ SOLN
Freq: Once | INTRAVENOUS | Status: AC
  Administered 2018-08-25: 19:00:00 via INTRAVENOUS
  Filled 2018-08-25: qty 1000

## 2018-08-25 MED ORDER — LORAZEPAM 2 MG/ML IJ SOLN: 1.0000 mg | Freq: Four times a day (QID) | INTRAMUSCULAR | Status: DC | PRN

## 2018-08-25 MED ORDER — MORPHINE SULFATE (PF) 4 MG/ML IV SOLN
4.0000 mg | Freq: Once | INTRAVENOUS | Status: AC
  Administered 2018-08-25: 4 mg via INTRAVENOUS
  Filled 2018-08-25: qty 1

## 2018-08-25 MED ORDER — PREDNISOLONE ACETATE 1 % OP SUSP
1.0000 [drp] | Freq: Three times a day (TID) | OPHTHALMIC | Status: DC
  Administered 2018-08-25 – 2018-08-26 (×3): 1 [drp] via OPHTHALMIC
  Filled 2018-08-25: qty 5

## 2018-08-25 MED ORDER — ONDANSETRON HCL 4 MG PO TABS: 4.0000 mg | ORAL_TABLET | Freq: Four times a day (QID) | ORAL | Status: DC | PRN

## 2018-08-25 MED ORDER — THIAMINE HCL 100 MG/ML IJ SOLN
100.0000 mg | Freq: Every day | INTRAMUSCULAR | Status: DC
  Administered 2018-08-25: 100 mg via INTRAVENOUS
  Filled 2018-08-25: qty 2

## 2018-08-25 MED ORDER — ONDANSETRON HCL 4 MG/2ML IJ SOLN: 4.0000 mg | Freq: Four times a day (QID) | INTRAMUSCULAR | Status: DC | PRN

## 2018-08-25 MED ORDER — SODIUM CHLORIDE 0.9 % IV SOLN
INTRAVENOUS | Status: DC
  Administered 2018-08-25 – 2018-08-26 (×3): via INTRAVENOUS

## 2018-08-25 MED ORDER — ACETAMINOPHEN 650 MG RE SUPP: 650.0000 mg | Freq: Four times a day (QID) | RECTAL | Status: DC | PRN

## 2018-08-25 MED ORDER — FOLIC ACID 1 MG PO TABS
1.0000 mg | ORAL_TABLET | Freq: Every day | ORAL | Status: DC
  Administered 2018-08-25 – 2018-08-26 (×2): 1 mg via ORAL
  Filled 2018-08-25 (×2): qty 1

## 2018-08-25 MED ORDER — OXYCODONE HCL 5 MG PO TABS
5.0000 mg | ORAL_TABLET | ORAL | Status: DC | PRN
  Administered 2018-08-25: 5 mg via ORAL
  Filled 2018-08-25: qty 1

## 2018-08-25 MED ORDER — FOLIC ACID 1 MG PO TABS: 1.0000 mg | ORAL_TABLET | Freq: Every day | ORAL | Status: DC

## 2018-08-25 MED ORDER — HEPARIN SODIUM (PORCINE) 5000 UNIT/ML IJ SOLN
5000.0000 [IU] | Freq: Three times a day (TID) | INTRAMUSCULAR | Status: DC
  Administered 2018-08-25 – 2018-08-26 (×3): 5000 [IU] via SUBCUTANEOUS
  Filled 2018-08-25 (×3): qty 1

## 2018-08-25 MED ORDER — ADULT MULTIVITAMIN W/MINERALS CH
1.0000 | ORAL_TABLET | Freq: Every day | ORAL | Status: DC
  Administered 2018-08-25 – 2018-08-26 (×2): 1 via ORAL
  Filled 2018-08-25 (×2): qty 1

## 2018-08-25 MED ORDER — HYDRALAZINE HCL 20 MG/ML IJ SOLN
5.0000 mg | Freq: Four times a day (QID) | INTRAMUSCULAR | Status: DC | PRN
  Filled 2018-08-25: qty 1

## 2018-08-25 MED ORDER — IBUPROFEN 400 MG PO TABS
400.0000 mg | ORAL_TABLET | Freq: Once | ORAL | Status: AC
  Administered 2018-08-25: 400 mg via ORAL
  Filled 2018-08-25: qty 1

## 2018-08-25 MED ORDER — AMLODIPINE BESYLATE 10 MG PO TABS
10.0000 mg | ORAL_TABLET | Freq: Every day | ORAL | Status: DC
  Administered 2018-08-25 – 2018-08-26 (×2): 10 mg via ORAL
  Filled 2018-08-25 (×2): qty 1

## 2018-08-25 MED ORDER — LORAZEPAM 1 MG PO TABS: 1.0000 mg | ORAL_TABLET | Freq: Four times a day (QID) | ORAL | Status: DC | PRN

## 2018-08-25 MED ORDER — VITAMIN B-1 100 MG PO TABS: 100.0000 mg | ORAL_TABLET | Freq: Every day | ORAL | Status: DC

## 2018-08-25 MED ORDER — ATROPINE SULFATE 1 % OP SOLN
1.0000 [drp] | Freq: Two times a day (BID) | OPHTHALMIC | Status: DC
  Administered 2018-08-25 – 2018-08-26 (×2): 1 [drp] via OPHTHALMIC
  Filled 2018-08-25: qty 2

## 2018-08-25 MED ORDER — SODIUM CHLORIDE 0.9 % IV SOLN
INTRAVENOUS | Status: DC
  Administered 2018-08-25: 14:00:00 via INTRAVENOUS

## 2018-08-25 MED ORDER — ACETAMINOPHEN 325 MG PO TABS
650.0000 mg | ORAL_TABLET | Freq: Four times a day (QID) | ORAL | Status: DC | PRN
  Filled 2018-08-25: qty 2

## 2018-08-25 MED ORDER — VITAMIN B-1 100 MG PO TABS
100.0000 mg | ORAL_TABLET | Freq: Every day | ORAL | Status: DC
  Administered 2018-08-26: 100 mg via ORAL
  Filled 2018-08-25: qty 1

## 2018-08-25 MED ORDER — LATANOPROST 0.005 % OP SOLN
1.0000 [drp] | Freq: Every day | OPHTHALMIC | Status: DC
  Administered 2018-08-25: 1 [drp] via OPHTHALMIC
  Filled 2018-08-25: qty 2.5
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
## 2018-08-25 NOTE — ED Notes (Signed)
Patient transported to CT 

## 2018-08-25 NOTE — ED Notes (Signed)
CBG 163 

## 2018-08-25 NOTE — Progress Notes (Signed)
MD notified of critical lab value of troponin 0.08.

## 2018-08-25 NOTE — ED Provider Notes (Signed)
[facility] EMERGENCY DEPARTMENT Provider Note   CSN: [id number] Arrival date & time: 08/25/18  1156     History   Chief Complaint Chief Complaint  Patient presents with  . Near Syncope  . Weakness    HPI Jeffrey Mullen is a 67 y.o. male.  HPI Presents to the emergency room for evaluation of possible seizure versus syncopal episode.  Patient states he was taking a nap outside when the next thing he remembers is EMS taking him to the hospital.  Patient does not recall having any falls or injuries however he must of had some type of injury during the episode because he has an abrasion on his right cheek.  Patient does have tenderness on the right side of his face and head.  Patient denies any history of prior fainting spells.  He denies any history of seizures.  He denies any trouble with neck pain chest pain abdominal pain.  No shortness of breath.  No numbness or weakness.  He does admit to drinking one beer yesterday.   Past Medical History:  Diagnosis Date  . Alcohol abuse   . Blind left eye   . Hepatic cirrhosis ([place])   . Hepatitis    HEP C treated in 2016  . HTN (hypertension)     Patient Active Problem List   Diagnosis Date Noted  . Head injury   . Traumatic brain injury with loss of consciousness ([place])   . Tobacco abuse   . SDH (subdural hematoma) ([place])   . ETOH abuse   . Blind left eye   . Benign essential HTN   . Intracranial hemorrhage ([place]) 06/22/2018  . Hepatic cirrhosis ([facility]) 02/10/2015  . Chronic hepatitis C without hepatic coma ([place]) 01/05/2015  . HTN (hypertension) 01/05/2015    History reviewed. No pertinent surgical history.      Home Medications    Prior to Admission medications   Medication Sig Start Date End Date Taking? Authorizing Provider  ibuprofen (ADVIL,MOTRIN) 200 MG tablet Take 400 mg by mouth every 8 (eight) hours as needed (for headaches).   Yes [provider]  amLODipine (NORVASC) 10 MG tablet Take 1  tablet (10 mg total) by mouth daily. Patient not taking: Reported on 08/25/2018 07/02/18   Jackson Latino L, PA  atropine 1 % ophthalmic solution Place 1 drop into the left eye 2 (two) times daily. Patient not taking: Reported on 08/25/2018 07/02/18   Jackson Latino L, PA  latanoprost (XALATAN) 0.005 % ophthalmic solution Place 1 drop into the right eye at bedtime. Patient not taking: Reported on 08/25/2018 07/02/18   Kalman Drape, PA  lisinopril-hydrochlorothiazide (PRINZIDE,ZESTORETIC) 20-12.5 MG tablet Take 1 tablet by mouth daily. Patient not taking: Reported on 08/25/2018 07/02/18   Jackson Latino L, PA  prednisoLONE acetate (PRED FORTE) 1 % ophthalmic suspension Place 1 drop into the left eye 3 (three) times daily. Patient not taking: Reported on 08/25/2018 07/02/18   Kalman Drape, PA  traMADol (ULTRAM) 50 MG tablet Take 1 tablet (50 mg total) by mouth every 6 (six) hours. Patient not taking: Reported on 08/25/2018 07/03/18   Kalman Drape, PA    Family History No family history on file.  Social History Social History   Tobacco Use  . Smoking status: Current Some Day Smoker    Types: Cigarettes  . Smokeless tobacco: Never Used  Substance Use Topics  . Alcohol use: No    Alcohol/week: 0.0 standard drinks  . Drug  use: No     Allergies   Acetaminophen   Review of Systems Review of Systems  All other systems reviewed and are negative.    Physical Exam Updated Vital Signs BP (!) 183/95   Pulse 81   Temp 98 F (36.7 C) (Oral)   Resp 19   Ht 1.803 m (5\' 11" )   Wt 86.2 kg   SpO2 96%   BMI 26.50 kg/m   Physical Exam  Constitutional: He appears well-developed and well-nourished. No distress.  HENT:  Head: Normocephalic.  Right Ear: External ear normal.  Left Ear: External ear normal.  Abrasion right cheek, tenderness palpation in that area; contusion left upper lip, poor dentition but no evidence of acute injury  Eyes: Conjunctivae are normal. Right eye exhibits no  discharge. Left eye exhibits no discharge. No scleral icterus.  Corneal opacification left eye  Neck: Neck supple. No tracheal deviation present.  Cardiovascular: Normal rate, regular rhythm and intact distal pulses.  Pulmonary/Chest: Effort normal and breath sounds normal. No stridor. No respiratory distress. He has no wheezes. He has no rales.  Abdominal: Soft. Bowel sounds are normal. He exhibits no distension. There is no tenderness. There is no rebound and no guarding.  Musculoskeletal: He exhibits no edema or tenderness.  Neurological: He is alert. He has normal strength. No cranial nerve deficit (no facial droop, extraocular movements intact, no slurred speech) or sensory deficit. He exhibits normal muscle tone. He displays no seizure activity. Coordination normal.  Skin: Skin is warm and dry. No rash noted.  Psychiatric: He has a normal mood and affect.  Nursing note and vitals reviewed.    ED Treatments / Results  Labs (all labs ordered are listed, but only abnormal results are displayed) Labs Reviewed  BASIC METABOLIC PANEL - Abnormal; Notable for the following components:      Result Value   CO2 20 (*)    Glucose, Bld 166 (*)    BUN 7 (*)    Calcium 8.8 (*)    Anion gap 17 (*)    All other components within normal limits  URINALYSIS, ROUTINE W REFLEX MICROSCOPIC - Abnormal; Notable for the following components:   Color, Urine STRAW (*)    Hgb urine dipstick SMALL (*)    Ketones, ur 5 (*)    Protein, ur 30 (*)    Bacteria, UA RARE (*)    All other components within normal limits  HEPATIC FUNCTION PANEL - Abnormal; Notable for the following components:   AST 43 (*)    All other components within normal limits  CBG MONITORING, ED - Abnormal; Notable for the following components:   Glucose-Capillary 163 (*)    All other components within normal limits  CBC  TROPONIN I  ETHANOL  CBG MONITORING, ED    EKG EKG Interpretation  Date/Time:  Monday August 25 2018  13:22:32 EDT Ventricular Rate:  92 PR Interval:    QRS Duration: 77 QT Interval:  364 QTC Calculation: 451 R Axis:   69 Text Interpretation:  Sinus rhythm Probable left atrial enlargement Anteroseptal infarct, old No old tracing to compare Confirmed by Dorie Rank [phone]) on 08/25/2018 1:52:58 PM Also confirmed by Dorie Rank [phone]), editor Philomena Doheny [phone])  on 08/25/2018 2:23:01 PM   Radiology Dg Chest 2 View  Result Date: 08/25/2018 CLINICAL DATA:  Patient status post fall. EXAM: CHEST - 2 VIEW COMPARISON:  Chest radiograph 06/22/2018 FINDINGS: Monitoring leads overlie the patient. Stable cardiac and mediastinal contours. No consolidative  pulmonary opacities. No pleural effusion or pneumothorax. Thoracic spine degenerative changes. IMPRESSION: No acute cardiopulmonary process. Electronically Signed   By: Lovey Newcomer M.D.   On: 08/25/2018 14:22   Ct Head Wo Contrast  Result Date: 08/25/2018 CLINICAL DATA:  Syncope and fall today with blows to the face and head. EXAM: CT HEAD WITHOUT CONTRAST CT MAXILLOFACIAL WITHOUT CONTRAST CT CERVICAL SPINE WITHOUT CONTRAST TECHNIQUE: Multidetector CT imaging of the head, cervical spine, and maxillofacial structures were performed using the standard protocol without intravenous contrast. Multiplanar CT image reconstructions of the cervical spine and maxillofacial structures were also generated. COMPARISON:  Head, maxillofacial and cervical spine CT scans 06/22/2018. FINDINGS: CT HEAD FINDINGS Brain: No evidence of acute infarction, hemorrhage, hydrocephalus, extra-axial collection or mass lesion/mass effect. Previously seen left frontal hematoma and right occipital lobe contusion have resolved. There is some cortical atrophy and chronic microvascular ischemic change. Vascular: Extensive atherosclerosis noted. Skull: Nondisplaced left frontal bone fracture seen on the prior examination is again identified. Fracture line remains visible but the fracture is  nondisplaced. No new bony abnormality. No focal lesion. Other: None. CT MAXILLOFACIAL FINDINGS Osseous: No acute facial bone fracture is identified. Remote nasal bone fractures, left orbital floor and roof fractures, right frontal and posterior wall of the maxillary sinus fractures, right lateral pterygoid plate and right mandibular fractures are all again seen. No new fracture is identified. Mandibular condyles are located. Orbits: Elongated left globe is unchanged. No lenses seen. Orbital fat is clear. Sinuses: Small mucous retention cyst or polyp in the sphenoid sinus is noted. Soft tissues: Negative. CT CERVICAL SPINE FINDINGS Alignment: Maintained with straightening of lordosis noted. Skull base and vertebrae: No acute fracture. No primary bone lesion or focal pathologic process. Soft tissues and spinal canal: No prevertebral fluid or swelling. No visible canal hematoma. Disc levels: Multilevel degenerative disc disease appears worst at C3-4 and C6-7, unchanged. Upper chest: There is some emphysematous disease in the lung apices. Other: None. IMPRESSION: No acute abnormality head, face or cervical spine. Resolved left frontal intraparenchymal hematoma and right occipital lobe contusion. Multiple remote facial fractures. Electronically Signed   By: Inge Rise M.D.   On: 08/25/2018 14:18   Ct Cervical Spine Wo Contrast  Result Date: 08/25/2018 CLINICAL DATA:  Syncope and fall today with blows to the face and head. EXAM: CT HEAD WITHOUT CONTRAST CT MAXILLOFACIAL WITHOUT CONTRAST CT CERVICAL SPINE WITHOUT CONTRAST TECHNIQUE: Multidetector CT imaging of the head, cervical spine, and maxillofacial structures were performed using the standard protocol without intravenous contrast. Multiplanar CT image reconstructions of the cervical spine and maxillofacial structures were also generated. COMPARISON:  Head, maxillofacial and cervical spine CT scans 06/22/2018. FINDINGS: CT HEAD FINDINGS Brain: No evidence of  acute infarction, hemorrhage, hydrocephalus, extra-axial collection or mass lesion/mass effect. Previously seen left frontal hematoma and right occipital lobe contusion have resolved. There is some cortical atrophy and chronic microvascular ischemic change. Vascular: Extensive atherosclerosis noted. Skull: Nondisplaced left frontal bone fracture seen on the prior examination is again identified. Fracture line remains visible but the fracture is nondisplaced. No new bony abnormality. No focal lesion. Other: None. CT MAXILLOFACIAL FINDINGS Osseous: No acute facial bone fracture is identified. Remote nasal bone fractures, left orbital floor and roof fractures, right frontal and posterior wall of the maxillary sinus fractures, right lateral pterygoid plate and right mandibular fractures are all again seen. No new fracture is identified. Mandibular condyles are located. Orbits: Elongated left globe is unchanged. No lenses seen. Orbital fat is clear.  Sinuses: Small mucous retention cyst or polyp in the sphenoid sinus is noted. Soft tissues: Negative. CT CERVICAL SPINE FINDINGS Alignment: Maintained with straightening of lordosis noted. Skull base and vertebrae: No acute fracture. No primary bone lesion or focal pathologic process. Soft tissues and spinal canal: No prevertebral fluid or swelling. No visible canal hematoma. Disc levels: Multilevel degenerative disc disease appears worst at C3-4 and C6-7, unchanged. Upper chest: There is some emphysematous disease in the lung apices. Other: None. IMPRESSION: No acute abnormality head, face or cervical spine. Resolved left frontal intraparenchymal hematoma and right occipital lobe contusion. Multiple remote facial fractures. Electronically Signed   By: Inge Rise M.D.   On: 08/25/2018 14:18   Ct Maxillofacial Wo Contrast  Result Date: 08/25/2018 CLINICAL DATA:  Syncope and fall today with blows to the face and head. EXAM: CT HEAD WITHOUT CONTRAST CT MAXILLOFACIAL  WITHOUT CONTRAST CT CERVICAL SPINE WITHOUT CONTRAST TECHNIQUE: Multidetector CT imaging of the head, cervical spine, and maxillofacial structures were performed using the standard protocol without intravenous contrast. Multiplanar CT image reconstructions of the cervical spine and maxillofacial structures were also generated. COMPARISON:  Head, maxillofacial and cervical spine CT scans 06/22/2018. FINDINGS: CT HEAD FINDINGS Brain: No evidence of acute infarction, hemorrhage, hydrocephalus, extra-axial collection or mass lesion/mass effect. Previously seen left frontal hematoma and right occipital lobe contusion have resolved. There is some cortical atrophy and chronic microvascular ischemic change. Vascular: Extensive atherosclerosis noted. Skull: Nondisplaced left frontal bone fracture seen on the prior examination is again identified. Fracture line remains visible but the fracture is nondisplaced. No new bony abnormality. No focal lesion. Other: None. CT MAXILLOFACIAL FINDINGS Osseous: No acute facial bone fracture is identified. Remote nasal bone fractures, left orbital floor and roof fractures, right frontal and posterior wall of the maxillary sinus fractures, right lateral pterygoid plate and right mandibular fractures are all again seen. No new fracture is identified. Mandibular condyles are located. Orbits: Elongated left globe is unchanged. No lenses seen. Orbital fat is clear. Sinuses: Small mucous retention cyst or polyp in the sphenoid sinus is noted. Soft tissues: Negative. CT CERVICAL SPINE FINDINGS Alignment: Maintained with straightening of lordosis noted. Skull base and vertebrae: No acute fracture. No primary bone lesion or focal pathologic process. Soft tissues and spinal canal: No prevertebral fluid or swelling. No visible canal hematoma. Disc levels: Multilevel degenerative disc disease appears worst at C3-4 and C6-7, unchanged. Upper chest: There is some emphysematous disease in the lung apices.  Other: None. IMPRESSION: No acute abnormality head, face or cervical spine. Resolved left frontal intraparenchymal hematoma and right occipital lobe contusion. Multiple remote facial fractures. Electronically Signed   By: Inge Rise M.D.   On: 08/25/2018 14:18    Procedures Procedures (including critical care time)  Medications Ordered in ED Medications  0.9 %  sodium chloride infusion ( Intravenous New Bag/Given 08/25/18 1415)  morphine 4 MG/ML injection 4 mg (has no administration in time range)  ibuprofen (ADVIL,MOTRIN) tablet 400 mg (400 mg Oral Given 08/25/18 1415)     Initial Impression / Assessment and Plan / ED Course  I have reviewed the triage vital signs and the nursing notes.  Pertinent labs & imaging results that were available during my care of the patient were reviewed by me and considered in my medical decision making (see chart for details).   Patient presented to the emergency room for evaluation of seizure versus a syncopal episode.  Patient appears to have history of hepatitis C and to some  current alcohol use.  Patient had a syncopal episode however there is no clear prodrome.  There is question of seizure however there is no seizure activity witnessed and the patient does not have a history of seizure disorder.  It is possible he could have alcohol withdrawal seizures but he is not intoxicated here in he does not admit to heavy alcohol use.  Patient's initial ED work-up does not show any serious injury.  NO indication as to the cause of his syncope vs seizure.  I will consult medical service to bring him in for cardiac monitoring and also to make sure he does not have any recurrent syncopal versus seizure episodes.  Final Clinical Impressions(s) / ED Diagnoses   Final diagnoses:  Syncope, unspecified syncope type  Facial injury, initial encounter      Dorie Rank, MD 08/25/18 1525

## 2018-08-26 ENCOUNTER — Ambulatory Visit (HOSPITAL_BASED_OUTPATIENT_CLINIC_OR_DEPARTMENT_OTHER): Payer: No Typology Code available for payment source

## 2018-08-26 ENCOUNTER — Observation Stay (HOSPITAL_COMMUNITY): Payer: No Typology Code available for payment source

## 2018-08-26 DIAGNOSIS — R55 Syncope and collapse: Secondary | ICD-10-CM

## 2018-08-26 DIAGNOSIS — I517 Cardiomegaly: Secondary | ICD-10-CM

## 2018-08-26 DIAGNOSIS — F1010 Alcohol abuse, uncomplicated: Secondary | ICD-10-CM

## 2018-08-26 LAB — COMPREHENSIVE METABOLIC PANEL
ALBUMIN: 3.4 g/dL — AB (ref 3.5–5.0)
ALT: 18 U/L (ref 0–44)
ANION GAP: 8 (ref 5–15)
AST: 34 U/L (ref 15–41)
Alkaline Phosphatase: 87 U/L (ref 38–126)
BUN: 6 mg/dL — AB (ref 8–23)
CHLORIDE: 106 mmol/L (ref 98–111)
CO2: 26 mmol/L (ref 22–32)
Calcium: 8.3 mg/dL — ABNORMAL LOW (ref 8.9–10.3)
Creatinine, Ser: 0.96 mg/dL (ref 0.61–1.24)
GFR calc Af Amer: >60 mL/min (ref >60)
GFR calc non Af Amer: >60 mL/min (ref >60)
GLUCOSE: 97 mg/dL (ref 70–99)
POTASSIUM: 3.3 mmol/L — AB (ref 3.5–5.1)
Sodium: 140 mmol/L (ref 135–145)
TOTAL PROTEIN: 6.4 g/dL — AB (ref 6.5–8.1)
Total Bilirubin: 1.1 mg/dL (ref 0.3–1.2)

## 2018-08-26 LAB — GLUCOSE, CAPILLARY
Glucose-Capillary: 89 mg/dL (ref 70–99)
Glucose-Capillary: 101 mg/dL — ABNORMAL HIGH (ref 70–99)
Glucose-Capillary: 118 mg/dL — ABNORMAL HIGH (ref 70–99)

## 2018-08-26 LAB — CBC
HEMATOCRIT: 36.9 % — AB (ref 39.0–52.0)
HEMOGLOBIN: 12.3 g/dL — AB (ref 13.0–17.0)
MCH: 29.1 pg (ref 26.0–34.0)
MCHC: 33.3 g/dL (ref 30.0–36.0)
MCV: 87.4 fL (ref 78.0–100.0)
Platelets: 187 10*3/uL (ref 150–400)
RBC: 4.22 MIL/uL (ref 4.22–5.81)
RDW: 15 % (ref 11.5–15.5)
WBC: 5.1 10*3/uL (ref 4.0–10.5)

## 2018-08-26 LAB — RAPID URINE DRUG SCREEN, HOSP PERFORMED
Amphetamines: NOT DETECTED
BARBITURATES: NOT DETECTED
Benzodiazepines: POSITIVE — AB
COCAINE: NOT DETECTED
OPIATES: NOT DETECTED
TETRAHYDROCANNABINOL: NOT DETECTED

## 2018-08-26 LAB — TROPONIN I
TROPONIN I: 0.07 ng/mL — AB (ref <0.03)
Troponin I: 0.05 ng/mL (ref <0.03)

## 2018-08-26 LAB — ECHOCARDIOGRAM COMPLETE
HEIGHTINCHES: 71 in
Weight: 3040 oz

## 2018-08-26 LAB — MAGNESIUM: Magnesium: 1.8 mg/dL (ref 1.7–2.4)

## 2018-08-26 MED ORDER — MAGNESIUM SULFATE IN D5W 1-5 GM/100ML-% IV SOLN
1.0000 g | Freq: Once | INTRAVENOUS | Status: AC
  Administered 2018-08-26: 1 g via INTRAVENOUS
  Filled 2018-08-26: qty 100

## 2018-08-26 MED ORDER — LISINOPRIL-HYDROCHLOROTHIAZIDE 20-12.5 MG PO TABS: 1.0000 | ORAL_TABLET | Freq: Every day | ORAL | 0 refills | Status: DC

## 2018-08-26 MED ORDER — AMLODIPINE BESYLATE 10 MG PO TABS: 10.0000 mg | ORAL_TABLET | Freq: Every day | ORAL | 0 refills | Status: DC

## 2018-08-26 MED ORDER — LISINOPRIL 20 MG PO TABS
20.0000 mg | ORAL_TABLET | Freq: Every day | ORAL | Status: DC
  Administered 2018-08-26: 20 mg via ORAL
  Filled 2018-08-26: qty 1

## 2018-08-26 MED ORDER — POTASSIUM CHLORIDE CRYS ER 20 MEQ PO TBCR
20.0000 meq | EXTENDED_RELEASE_TABLET | Freq: Once | ORAL | Status: AC
  Administered 2018-08-26: 20 meq via ORAL
  Filled 2018-08-26: qty 1

## 2018-08-26 MED ORDER — POTASSIUM CHLORIDE CRYS ER 20 MEQ PO TBCR: 40.0000 meq | EXTENDED_RELEASE_TABLET | Freq: Once | ORAL | Status: DC

## 2018-08-26 NOTE — Progress Notes (Signed)
  Echocardiogram 2D Echocardiogram has been performed.  Merrie Roof F 08/26/2018, 11:21 AM

## 2018-08-26 NOTE — Procedures (Signed)
History: 67 yo M with syncope  Sedation: None  Technique: This is a 21 channel routine scalp EEG performed at the bedside with bipolar and monopolar montages arranged in accordance to the international 10/20 system of electrode placement. One channel was dedicated to EKG recording.    Background: The background consists of intermixed alpha and beta activities. There is a well defined posterior dominant rhythm of 10 Hz that attenuates with eye opening. Sleep is recorded with normal appearing structures.   Photic stimulation: Physiologic driving is not performed  EEG Abnormalities: None  Clinical Interpretation: This normal EEG is recorded in the waking and sleep state. There was no seizure or seizure predisposition recorded on this study. Please note that a normal EEG does not preclude the possibility of epilepsy.   Roland Rack, MD [facility] [phone]  If 7pm- 7am, please page neurology on call as listed in [place].

## 2018-08-26 NOTE — Progress Notes (Signed)
Pt d/c home. Pt is alert and oriented with no new concerns. Pt will be going back to the shelter. Bus pass provided. Pt is taken out of the building by RN.
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
## 2018-08-26 NOTE — Progress Notes (Signed)
EEG completed; results pending. (routine per Dr Leonel Ramsay, neurology)

## 2018-08-26 NOTE — Care Management Note (Addendum)
Case Management Note  Patient Details  Name: HARVIR PATRY MRN: [id number] Date of Birth: August 05, 1951  Subjective/Objective:   Pt in with syncope. He is staying at [facility] currently. Pt denies any DME. He sees Dr Marlou Sa as his PCP. Currently has an orange card that assist with the cost of his medications and he uses [facility] for his pharmacy. Pt would need any new medications kept as inexpensive as possible.        Pt uses the bus for his transportation needs.           Action/Plan: CM following for medication assistance and will look into his Atropine gtts that he says are expensive. CM following.  1730 Addendum: pt is discharging this evening. Pt is going to try and get into [facility]. CM provided him a bus pass. Pt to take his prescriptions to [facility] in the am. Pt to f/u with Dr Deans office this week and see if they can assist with the cost of the Atropine gtts.    Expected Discharge Date:                  Expected Discharge Plan:  Home/Self Care  In-House Referral:     Discharge planning Services  CM Consult  Post Acute Care Choice:    Choice offered to:     DME Arranged:    DME Agency:     HH Arranged:    HH Agency:     Status of Service:  In process, will continue to follow  If discussed at Long Length of Stay Meetings, dates discussed:    Additional Comments:  Pollie Friar, RN 08/26/2018, 3:46 PM
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
## 2018-11-01 ENCOUNTER — Emergency Department (HOSPITAL_COMMUNITY): Payer: Self-pay

## 2018-11-01 ENCOUNTER — Encounter (HOSPITAL_COMMUNITY): Payer: Self-pay

## 2018-11-01 ENCOUNTER — Emergency Department (HOSPITAL_COMMUNITY)
Admission: EM | Admit: 2018-11-01 | Discharge: 2018-11-02 | Disposition: A | Discharge status: Home / Self Care | Payer: Self-pay | Attending: Emergency Medicine | Admitting: Emergency Medicine

## 2018-11-01 DIAGNOSIS — W19XXXA Unspecified fall, initial encounter: Secondary | ICD-10-CM

## 2018-11-01 DIAGNOSIS — F10920 Alcohol use, unspecified with intoxication, uncomplicated: Secondary | ICD-10-CM | POA: Insufficient documentation

## 2018-11-01 DIAGNOSIS — W1830XA Fall on same level, unspecified, initial encounter: Secondary | ICD-10-CM | POA: Insufficient documentation

## 2018-11-01 DIAGNOSIS — Y939 Activity, unspecified: Secondary | ICD-10-CM | POA: Insufficient documentation

## 2018-11-01 DIAGNOSIS — Y908 Blood alcohol level of 240 mg/100 ml or more: Secondary | ICD-10-CM | POA: Insufficient documentation

## 2018-11-01 DIAGNOSIS — S0001XA Abrasion of scalp, initial encounter: Secondary | ICD-10-CM | POA: Insufficient documentation

## 2018-11-01 DIAGNOSIS — S20211A Contusion of right front wall of thorax, initial encounter: Secondary | ICD-10-CM | POA: Insufficient documentation

## 2018-11-01 DIAGNOSIS — S161XXA Strain of muscle, fascia and tendon at neck level, initial encounter: Secondary | ICD-10-CM | POA: Insufficient documentation

## 2018-11-01 DIAGNOSIS — I10 Essential (primary) hypertension: Secondary | ICD-10-CM | POA: Insufficient documentation

## 2018-11-01 DIAGNOSIS — S0990XA Unspecified injury of head, initial encounter: Secondary | ICD-10-CM | POA: Insufficient documentation

## 2018-11-01 DIAGNOSIS — F17210 Nicotine dependence, cigarettes, uncomplicated: Secondary | ICD-10-CM | POA: Insufficient documentation

## 2018-11-01 DIAGNOSIS — Y999 Unspecified external cause status: Secondary | ICD-10-CM | POA: Insufficient documentation

## 2018-11-01 DIAGNOSIS — Y9289 Other specified places as the place of occurrence of the external cause: Secondary | ICD-10-CM | POA: Insufficient documentation

## 2018-11-01 DIAGNOSIS — Z79899 Other long term (current) drug therapy: Secondary | ICD-10-CM | POA: Insufficient documentation

## 2018-11-01 LAB — CBC WITH DIFFERENTIAL/PLATELET
Abs Immature Granulocytes: 0.02 10*3/uL (ref 0.00–0.07)
BASOS ABS: 0 10*3/uL (ref 0.0–0.1)
BASOS PCT: 0 %
EOS ABS: 0 10*3/uL (ref 0.0–0.5)
EOS PCT: 0 %
HCT: 39.1 % (ref 39.0–52.0)
Hemoglobin: 12.6 g/dL — ABNORMAL LOW (ref 13.0–17.0)
Immature Granulocytes: 0 %
LYMPHS ABS: 2.4 10*3/uL (ref 0.7–4.0)
Lymphocytes Relative: 29 %
MCH: 29.4 pg (ref 26.0–34.0)
MCHC: 32.2 g/dL (ref 30.0–36.0)
MCV: 91.1 fL (ref 80.0–100.0)
MONOS PCT: 18 %
Monocytes Absolute: 1.5 10*3/uL — ABNORMAL HIGH (ref 0.1–1.0)
NRBC: 0 % (ref 0.0–0.2)
Neutro Abs: 4.2 10*3/uL (ref 1.7–7.7)
Neutrophils Relative %: 53 %
Platelets: 219 10*3/uL (ref 150–400)
RBC: 4.29 MIL/uL (ref 4.22–5.81)
RDW: 14.5 % (ref 11.5–15.5)
WBC: 8.1 10*3/uL (ref 4.0–10.5)

## 2018-11-01 LAB — BASIC METABOLIC PANEL
Anion gap: 15 (ref 5–15)
BUN: 8 mg/dL (ref 8–23)
CALCIUM: 8.5 mg/dL — AB (ref 8.9–10.3)
CO2: 23 mmol/L (ref 22–32)
CREATININE: 1.02 mg/dL (ref 0.61–1.24)
Chloride: 108 mmol/L (ref 98–111)
GFR calc Af Amer: >60 mL/min (ref >60)
Glucose, Bld: 99 mg/dL (ref 70–99)
Potassium: 3.4 mmol/L — ABNORMAL LOW (ref 3.5–5.1)
Sodium: 146 mmol/L — ABNORMAL HIGH (ref 135–145)

## 2018-11-01 LAB — ETHANOL: Alcohol, Ethyl (B): 378 mg/dL (ref <10)

## 2018-11-01 MED ORDER — IBUPROFEN 200 MG PO TABS
600.0000 mg | ORAL_TABLET | Freq: Once | ORAL | Status: AC
  Administered 2018-11-01: 600 mg via ORAL
  Filled 2018-11-01: qty 3
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
## 2018-11-01 NOTE — ED Notes (Signed)
Bed: YI02 Expected date:  Expected time:  Means of arrival:  Comments: Hallway C
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
## 2018-11-01 NOTE — ED Provider Notes (Signed)
Assumed care from Dr. Stark Jock at 4:35 PM. Briefly, the patient is a 67 y.o. male with PMHx of  has a past medical history of Alcohol abuse, Blind left eye, Hepatic cirrhosis ([place]), Hepatitis, and HTN (hypertension). here with fall, alcohol intoxication. Imaging neg. Plan to re-assess once sober .   Labs Reviewed  BASIC METABOLIC PANEL - Abnormal; Notable for the following components:      Result Value   Sodium 146 (*)    Potassium 3.4 (*)    Calcium 8.5 (*)    All other components within normal limits  CBC WITH DIFFERENTIAL/PLATELET - Abnormal; Notable for the following components:   Hemoglobin 12.6 (*)    Monocytes Absolute 1.5 (*)    All other components within normal limits  ETHANOL - Abnormal; Notable for the following components:   Alcohol, Ethyl (B) 378 (*)    All other components within normal limits    Course of Care: Pt now awake, alert, ambulatory, and sober. No signs of withdrawal. He has a mild HA but otherwise denies complaints. He was able to eat a small meal. Denies HI, SI, AVH. D/c home.     Duffy Bruce, MD 11/01/18 [phone]

## 2018-11-01 NOTE — ED Notes (Signed)
[place]  sandwich and ginger ale x 2 given. Patient tolerating well.

## 2018-11-01 NOTE — ED Notes (Signed)
Patient transported to CT 

## 2018-11-28 ENCOUNTER — Encounter: Payer: Self-pay | Admitting: Pediatric Intensive Care

## 2018-11-28 ENCOUNTER — Encounter: Payer: Self-pay | Admitting: Critical Care Medicine

## 2018-11-28 MED FILL — IBUPROFEN 600 MG TABLET: 600 | 15 days supply | Qty: 60 | Fill #0

## 2018-11-28 MED FILL — LISINOPRIL-HCTZ 20-25 MG TA: 20-25 | 30 days supply | Qty: 30 | Fill #0

## 2018-11-28 MED FILL — AMLODIPINE BESYLATE 10 MG T: 10 | 30 days supply | Qty: 30 | Fill #0
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
## 2018-12-01 ENCOUNTER — Encounter: Payer: Self-pay | Admitting: Pediatric Intensive Care

## 2018-12-05 ENCOUNTER — Encounter: Payer: Self-pay | Admitting: Pediatric Intensive Care

## 2018-12-05 NOTE — Congregational Nurse Program (Signed)
  Dept: [place] [facility] Nurse Program Note  Date of Encounter: 12/05/2018  Past Medical History: Past Medical History:  Diagnosis Date  . Alcohol abuse   . Blind left eye   . Hepatic cirrhosis ([place])   . Hepatitis    HEP C treated in 2016  . HTN (hypertension)     Encounter Details:Follow up for BP check. Client states he has been taking his medication. Client states that "he feels better and less lethargic". CN will refer client to [facility] as his Medicare is active at the end of the month. Client desires to be reconnected with [facility] clinic for follow up on cataracts. Follow up in CN clinic with Nelson Chimes on Wednesday. [place] CNP [phone]

## 2018-12-10 ENCOUNTER — Encounter: Payer: Self-pay | Admitting: Pediatric Intensive Care

## 2018-12-10 NOTE — Congregational Nurse Program (Signed)
BP recheck
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
## 2018-12-12 ENCOUNTER — Encounter: Payer: Self-pay | Admitting: Critical Care Medicine

## 2018-12-12 ENCOUNTER — Ambulatory Visit: Payer: Medicare Other | Attending: Critical Care Medicine | Admitting: Critical Care Medicine

## 2018-12-12 VITALS — BP 149/83 | HR 83 | Temp 97.9°F | Resp 18 | Ht 71.0 in | Wt 179.0 lb

## 2018-12-12 DIAGNOSIS — Z720 Tobacco use: Secondary | ICD-10-CM

## 2018-12-12 DIAGNOSIS — H5442A4 Blindness left eye category 4, normal vision right eye: Secondary | ICD-10-CM

## 2018-12-12 DIAGNOSIS — S069X9D Unspecified intracranial injury with loss of consciousness of unspecified duration, subsequent encounter: Secondary | ICD-10-CM

## 2018-12-12 DIAGNOSIS — H259 Unspecified age-related cataract: Secondary | ICD-10-CM | POA: Diagnosis not present

## 2018-12-12 DIAGNOSIS — I10 Essential (primary) hypertension: Secondary | ICD-10-CM | POA: Diagnosis not present

## 2018-12-12 DIAGNOSIS — H409 Unspecified glaucoma: Secondary | ICD-10-CM

## 2018-12-12 DIAGNOSIS — G8929 Other chronic pain: Secondary | ICD-10-CM

## 2018-12-12 DIAGNOSIS — S065X9A Traumatic subdural hemorrhage with loss of consciousness of unspecified duration, initial encounter: Secondary | ICD-10-CM

## 2018-12-12 DIAGNOSIS — B1920 Unspecified viral hepatitis C without hepatic coma: Secondary | ICD-10-CM | POA: Insufficient documentation

## 2018-12-12 DIAGNOSIS — Z590 Homelessness: Secondary | ICD-10-CM | POA: Insufficient documentation

## 2018-12-12 DIAGNOSIS — Z87820 Personal history of traumatic brain injury: Secondary | ICD-10-CM | POA: Insufficient documentation

## 2018-12-12 DIAGNOSIS — Z5901 Sheltered homelessness: Secondary | ICD-10-CM

## 2018-12-12 DIAGNOSIS — H5440 Blindness, one eye, unspecified eye: Secondary | ICD-10-CM

## 2018-12-12 DIAGNOSIS — H5461 Unqualified visual loss, right eye, normal vision left eye: Secondary | ICD-10-CM | POA: Insufficient documentation

## 2018-12-12 DIAGNOSIS — H269 Unspecified cataract: Secondary | ICD-10-CM | POA: Insufficient documentation

## 2018-12-12 DIAGNOSIS — H5462 Unqualified visual loss, left eye, normal vision right eye: Secondary | ICD-10-CM | POA: Insufficient documentation

## 2018-12-12 DIAGNOSIS — S065XAA Traumatic subdural hemorrhage with loss of consciousness status unknown, initial encounter: Secondary | ICD-10-CM

## 2018-12-12 DIAGNOSIS — F1010 Alcohol abuse, uncomplicated: Secondary | ICD-10-CM

## 2018-12-12 DIAGNOSIS — K7460 Unspecified cirrhosis of liver: Secondary | ICD-10-CM | POA: Insufficient documentation

## 2018-12-12 DIAGNOSIS — Z79899 Other long term (current) drug therapy: Secondary | ICD-10-CM | POA: Insufficient documentation

## 2018-12-12 DIAGNOSIS — M25511 Pain in right shoulder: Secondary | ICD-10-CM | POA: Insufficient documentation

## 2018-12-12 DIAGNOSIS — F17210 Nicotine dependence, cigarettes, uncomplicated: Secondary | ICD-10-CM | POA: Insufficient documentation

## 2018-12-12 MED ORDER — AMLODIPINE BESYLATE 10 MG PO TABS: 10.0000 mg | ORAL_TABLET | Freq: Every day | ORAL | 6 refills | Status: DC

## 2018-12-12 MED ORDER — PREDNISOLONE ACETATE 1 % OP SUSP: 1.0000 [drp] | Freq: Three times a day (TID) | OPHTHALMIC | 1 refills | Status: DC

## 2018-12-12 MED ORDER — METHOCARBAMOL 500 MG PO TABS: 500.0000 mg | ORAL_TABLET | Freq: Four times a day (QID) | ORAL | 4 refills | Status: DC | PRN

## 2018-12-12 MED ORDER — IBUPROFEN 600 MG PO TABS: 600.0000 mg | ORAL_TABLET | Freq: Three times a day (TID) | ORAL | 6 refills | Status: DC | PRN

## 2018-12-12 MED ORDER — ATROPINE SULFATE 1 % OP SOLN: 1.0000 [drp] | Freq: Two times a day (BID) | OPHTHALMIC | 1 refills | Status: DC

## 2018-12-12 MED ORDER — LISINOPRIL-HYDROCHLOROTHIAZIDE 20-25 MG PO TABS: 1.0000 | ORAL_TABLET | Freq: Every day | ORAL | 6 refills | Status: DC

## 2018-12-12 MED ORDER — LATANOPROST 0.005 % OP SOLN: 1.0000 [drp] | Freq: Every day | OPHTHALMIC | 1 refills | Status: DC

## 2018-12-12 MED FILL — LATANOPROST 0.005% EYE DRP: 0.005 | 37 days supply | Qty: 3 | Fill #0

## 2018-12-12 MED FILL — PREDNISOLONE AC 1% EYE DROP: 1 | 25 days supply | Qty: 5 | Fill #0

## 2018-12-12 MED FILL — IBUPROFEN 600 MG TABLET: 600 | 10 days supply | Qty: 30 | Fill #0

## 2018-12-12 MED FILL — ATROPINE CARE 1% EYE DROPS: 1 | 15 days supply | Qty: 2 | Fill #0

## 2018-12-12 MED FILL — METHOCARBAMOL 500 MG TABS: 500 | 22 days supply | Qty: 90 | Fill #0

## 2018-12-12 NOTE — Assessment & Plan Note (Signed)
Patient currently lives in a homeless shelter environment and is working towards permanent housing

## 2018-12-12 NOTE — Assessment & Plan Note (Signed)
Essential hypertension with improved control on current medication profile  We will plan to continue blood pressure medication program however will increase lisinopril HCT to 20/25 mg daily The patient will continue amlodipine 10 mg daily  Return for a follow-up visit to establish with primary care in 1 month

## 2018-12-12 NOTE — Assessment & Plan Note (Signed)
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

## 2018-12-12 NOTE — Assessment & Plan Note (Signed)
Subdural hematoma has resolved

## 2018-12-12 NOTE — Assessment & Plan Note (Signed)
Ongoing tobacco use We had a discussion regarding the need to pursue smoking cessation at this visit

## 2018-12-12 NOTE — Patient Instructions (Addendum)
Increase lisinopril HCT to 20 mg / 20 mg 1 daily, you can finish your current supply of 20/12.5  Refills on your eyedrops were sent to the pharmacy  Labs today will be drawn including a metabolic panel complete blood count and thyroid panel  A muscle relaxant was also sent to the pharmacy along with anti-inflammatory  We will establish primary care follow-up in January

## 2018-12-12 NOTE — Assessment & Plan Note (Signed)
The patient has blindness in the left eye and significant cataract disease in the right eye with bilateral glaucoma  The patient will follow-up with his ophthalmologist We provided refills on his glaucoma eyedrops at this visit

## 2018-12-12 NOTE — Assessment & Plan Note (Signed)
Ongoing ethanol use Note the patient is down to 1 beer daily and we counseled the patient to reduce consumption further particular given the history of some liver cirrhosis

## 2018-12-12 NOTE — Assessment & Plan Note (Signed)
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

## 2018-12-12 NOTE — Assessment & Plan Note (Signed)
Chronic shoulder pain with muscle spasticity in the trapezius muscle and left and right neck muscles  The patient was given a prescription for ibuprofen 600 mg every 6 hours as needed along with Robaxin 500 mg 4 times daily as needed for muscle spasm
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
## 2018-12-13 LAB — COMPREHENSIVE METABOLIC PANEL
ALT: 110 IU/L — ABNORMAL HIGH (ref 0–44)
AST: 62 IU/L — ABNORMAL HIGH (ref 0–40)
Albumin/Globulin Ratio: 1.8 (ref 1.2–2.2)
Albumin: 4.8 g/dL (ref 3.6–4.8)
Alkaline Phosphatase: 106 IU/L (ref 39–117)
BUN/Creatinine Ratio: 14 (ref 10–24)
BUN: 14 mg/dL (ref 8–27)
Bilirubin Total: <0.2 mg/dL (ref 0.0–1.2)
CALCIUM: 10.4 mg/dL — AB (ref 8.6–10.2)
CO2: 27 mmol/L (ref 20–29)
CREATININE: 0.98 mg/dL (ref 0.76–1.27)
Chloride: 99 mmol/L (ref 96–106)
GFR calc Af Amer: 94 mL/min/{1.73_m2} (ref >59)
GFR calc non Af Amer: 81 mL/min/{1.73_m2} (ref >59)
Globulin, Total: 2.7 g/dL (ref 1.5–4.5)
Glucose: 77 mg/dL (ref 65–99)
Potassium: 3.8 mmol/L (ref 3.5–5.2)
Sodium: 141 mmol/L (ref 134–144)
Total Protein: 7.5 g/dL (ref 6.0–8.5)

## 2018-12-13 LAB — THYROID PANEL
Free Thyroxine Index: 2 (ref 1.2–4.9)
T3 Uptake Ratio: 27 % (ref 24–39)
T4, Total: 7.3 ug/dL (ref 4.5–12.0)

## 2018-12-26 MED FILL — AMLODIPINE BESYLATE 10 MG T: 10 | 30 days supply | Qty: 30 | Fill #0

## 2018-12-26 MED FILL — LISINOPRIL-HCTZ 20-25 MG TA: 20-25 | 30 days supply | Qty: 30 | Fill #0

## 2019-01-13 ENCOUNTER — Encounter: Payer: Self-pay | Admitting: Nurse Practitioner

## 2019-01-13 ENCOUNTER — Ambulatory Visit: Payer: Medicare Other | Attending: Nurse Practitioner | Admitting: Nurse Practitioner

## 2019-01-13 VITALS — BP 125/58 | HR 64 | Temp 98.2°F | Ht 71.0 in | Wt 178.0 lb

## 2019-01-13 DIAGNOSIS — J3489 Other specified disorders of nose and nasal sinuses: Secondary | ICD-10-CM

## 2019-01-13 DIAGNOSIS — F10920 Alcohol use, unspecified with intoxication, uncomplicated: Secondary | ICD-10-CM | POA: Diagnosis not present

## 2019-01-13 DIAGNOSIS — K089 Disorder of teeth and supporting structures, unspecified: Secondary | ICD-10-CM

## 2019-01-13 DIAGNOSIS — N521 Erectile dysfunction due to diseases classified elsewhere: Secondary | ICD-10-CM | POA: Diagnosis not present

## 2019-01-13 DIAGNOSIS — K7030 Alcoholic cirrhosis of liver without ascites: Secondary | ICD-10-CM

## 2019-01-13 DIAGNOSIS — I10 Essential (primary) hypertension: Secondary | ICD-10-CM

## 2019-01-13 DIAGNOSIS — H409 Unspecified glaucoma: Secondary | ICD-10-CM

## 2019-01-13 DIAGNOSIS — Z1211 Encounter for screening for malignant neoplasm of colon: Secondary | ICD-10-CM

## 2019-01-13 MED ORDER — LISINOPRIL-HYDROCHLOROTHIAZIDE 20-25 MG PO TABS: 1.0000 | ORAL_TABLET | Freq: Every day | ORAL | 1 refills | Status: DC

## 2019-01-13 MED ORDER — SILDENAFIL CITRATE 100 MG PO TABS: 50.0000 mg | ORAL_TABLET | Freq: Every day | ORAL | 99 refills | Status: DC | PRN

## 2019-01-13 MED ORDER — MUPIROCIN 2 % EX OINT: 1.0000 "application " | TOPICAL_OINTMENT | Freq: Two times a day (BID) | CUTANEOUS | 1 refills | Status: DC
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
## 2019-01-13 NOTE — Progress Notes (Signed)
Patient has sore in his nose.

## 2019-01-13 NOTE — Patient Instructions (Signed)
Health Maintenance After Age 65 After age 65, you are at a higher risk for certain long-term diseases and infections as well as injuries from falls. Falls are a major cause of broken bones and head injuries in people who are older than age 66. Getting regular preventive care can help to keep you healthy and well. Preventive care includes getting regular testing and making lifestyle changes as recommended by your health care provider. Talk with your health care provider about:  Which screenings and tests you should have. A screening is a test that checks for a disease when you have no symptoms.  A diet and exercise plan that is right for you. What should I know about screenings and tests to prevent falls? Screening and testing are the best ways to find a health problem early. Early diagnosis and treatment give you the best chance of managing medical conditions that are common after age 66. Certain conditions and lifestyle choices may make you more likely to have a fall. Your health care provider may recommend:  Regular vision checks. Poor vision and conditions such as cataracts can make you more likely to have a fall. If you wear glasses, make sure to get your prescription updated if your vision changes.  Medicine review. Work with your health care provider to regularly review all of the medicines you are taking, including over-the-counter medicines. Ask your health care provider about any side effects that may make you more likely to have a fall. Tell your health care provider if any medicines that you take make you feel dizzy or sleepy.  Osteoporosis screening. Osteoporosis is a condition that causes the bones to get weaker. This can make the bones weak and cause them to break more easily.  Blood pressure screening. Blood pressure changes and medicines to control blood pressure can make you feel dizzy.  Strength and balance checks. Your health care provider may recommend certain tests to check your  strength and balance while standing, walking, or changing positions.  Foot health exam. Foot pain and numbness, as well as not wearing proper footwear, can make you more likely to have a fall.  Depression screening. You may be more likely to have a fall if you have a fear of falling, feel emotionally low, or feel unable to do activities that you used to do.  Alcohol use screening. Using too much alcohol can affect your balance and may make you more likely to have a fall. What actions can I take to lower my risk of falls? General instructions  Talk with your health care provider about your risks for falling. Tell your health care provider if: ? You fall. Be sure to tell your health care provider about all falls, even ones that seem minor. ? You feel dizzy, sleepy, or off-balance.  Take over-the-counter and prescription medicines only as told by your health care provider. These include any supplements.  Eat a healthy diet and maintain a healthy weight. A healthy diet includes low-fat dairy products, low-fat (lean) meats, and fiber from whole grains, beans, and lots of fruits and vegetables. Home safety  Remove any tripping hazards, such as rugs, cords, and clutter.  Install safety equipment such as grab bars in bathrooms and safety rails on stairs.  Keep rooms and walkways well-lit. Activity   Follow a regular exercise program to stay fit. This will help you maintain your balance. Ask your health care provider what types of exercise are appropriate for you.  If you need a cane or   walker, use it as recommended by your health care provider.  Wear supportive shoes that have nonskid soles. Lifestyle  Do not drink alcohol if your health care provider tells you not to drink.  If you drink alcohol, limit how much you have: ? 0-1 drink a day for women. ? 0-2 drinks a day for men.  Be aware of how much alcohol is in your drink. In the [place] one drink equals one typical bottle of beer (12  oz), one-half glass of wine (5 oz), or one shot of hard liquor (1 oz).  Do not use any products that contain nicotine or tobacco, such as cigarettes and e-cigarettes. If you need help quitting, ask your health care provider. Summary  Having a healthy lifestyle and getting preventive care can help to protect your health and wellness after age 66.  Screening and testing are the best way to find a health problem early and help you avoid having a fall. Early diagnosis and treatment give you the best chance for managing medical conditions that are more common for people who are older than age 66.  Falls are a major cause of broken bones and head injuries in people who are older than age 66. Take precautions to prevent a fall at home.  Work with your health care provider to learn what changes you can make to improve your health and wellness and to prevent falls. This information is not intended to replace advice given to you by your health care provider. Make sure you discuss any questions you have with your health care provider. Document Released: 10/23/2017 Document Revised: 10/23/2017 Document Reviewed: 10/23/2017 Elsevier Interactive Patient Education  2019 Elsevier Inc.  

## 2019-01-14 LAB — PSA: Prostate Specific Ag, Serum: 4.2 ng/mL — ABNORMAL HIGH (ref 0.0–4.0)

## 2019-01-26 MED FILL — AMLODIPINE BESYLATE 10 MG T: 10 | 30 days supply | Qty: 30 | Fill #1

## 2019-01-26 MED FILL — MUPIROCIN 2% OINTMENT: 2 | 7 days supply | Qty: 22 | Fill #0

## 2019-01-26 MED FILL — LISINOPRIL-HCTZ 20-25 MG TA: 20-25 | 30 days supply | Qty: 30 | Fill #1

## 2019-02-04 ENCOUNTER — Encounter: Payer: Self-pay | Admitting: Nurse Practitioner

## 2019-02-04 ENCOUNTER — Ambulatory Visit: Payer: Medicare Other | Attending: Nurse Practitioner | Admitting: Nurse Practitioner

## 2019-02-04 VITALS — BP 147/73 | HR 84 | Temp 98.6°F | Ht 71.0 in | Wt 174.2 lb

## 2019-02-04 DIAGNOSIS — Z886 Allergy status to analgesic agent status: Secondary | ICD-10-CM | POA: Insufficient documentation

## 2019-02-04 DIAGNOSIS — Z79899 Other long term (current) drug therapy: Secondary | ICD-10-CM | POA: Insufficient documentation

## 2019-02-04 DIAGNOSIS — R058 Other specified cough: Secondary | ICD-10-CM

## 2019-02-04 DIAGNOSIS — R05 Cough: Secondary | ICD-10-CM | POA: Insufficient documentation

## 2019-02-04 DIAGNOSIS — F17210 Nicotine dependence, cigarettes, uncomplicated: Secondary | ICD-10-CM | POA: Insufficient documentation

## 2019-02-04 DIAGNOSIS — Z0000 Encounter for general adult medical examination without abnormal findings: Secondary | ICD-10-CM

## 2019-02-04 DIAGNOSIS — H5462 Unqualified visual loss, left eye, normal vision right eye: Secondary | ICD-10-CM | POA: Insufficient documentation

## 2019-02-04 DIAGNOSIS — I10 Essential (primary) hypertension: Secondary | ICD-10-CM | POA: Diagnosis not present

## 2019-02-04 DIAGNOSIS — Z7952 Long term (current) use of systemic steroids: Secondary | ICD-10-CM | POA: Insufficient documentation

## 2019-02-04 DIAGNOSIS — F17200 Nicotine dependence, unspecified, uncomplicated: Secondary | ICD-10-CM | POA: Diagnosis not present

## 2019-02-04 NOTE — Progress Notes (Signed)
Assessment & Plan:  Jeffrey Mullen was seen today for annual exam.  Diagnoses and all orders for this visit:  Encounter for annual physical exam -     CBC  Productive cough -     CBC  Essential hypertension Continue all antihypertensives as prescribed.  Remember to bring in your blood pressure log with you for your follow up appointment.  DASH/Mediterranean Diets are healthier choices for HTN.    Tobacco dependence Jeffrey Mullen was counseled on the dangers of tobacco use, and was advised to quit. Reviewed strategies to maximize success, including removing cigarettes and smoking materials from environment, stress management and support of family/friends as well as pharmacological alternatives including: Wellbutrin, Chantix, Nicotine patch, Nicotine gum or lozenges. Smoking cessation support: smoking cessation hotline: 1-800-QUIT-NOW.  Smoking cessation classes are also available through [facility]. Call [phone] or visit our website at [email or web address]   A total of 3 minutes was spent on counseling for smoking cessation and Jeffrey Mullen is not ready to quit.   Patient has been counseled on age-appropriate routine health concerns for screening and prevention. These are reviewed and up-to-date. Referrals have been placed accordingly. Immunizations are up-to-date or declined.    Subjective:   Chief Complaint  Patient presents with  . Annual Exam    Pt. is here for a physical. Pt. stated he already ate this morning.    HPI Jeffrey Mullen 68 y.o. male presents to office today for annual physical. He is currently staying at [facility] and endorses productive cough of clear sputum. Still smoking about 3 cigarettes per day. He denies any additional URI symptoms. Cough has been present for a few weeks. Aggravating factors: None. Relieving factors: NONE. He denies any allergies or asthma. He does endorse close contact with other sick residents of the facility he is  staying in.   ESSENTIAL HYPERTENSION Not well controlled. He did not take his blood pressure medication lisinopril-hctz 20-25 mg or amlodipine 10 mg today. He currently denies chest pain, shortness of breath, palpitations, lightheadedness, dizziness, headaches or BLE edema. He does not monitor his blood pressure at home.   BP Readings from Last 3 Encounters:  02/04/19 (!) 147/73  01/13/19 (!) 125/58  12/12/18 (!) 149/83    Review of Systems  Constitutional: Negative for fever, malaise/fatigue and weight loss.  HENT: Negative.  Negative for nosebleeds.   Eyes: Negative.  Negative for blurred vision, double vision and photophobia.       Blind left eye  Respiratory: Positive for cough and sputum production. Negative for shortness of breath.   Cardiovascular: Negative.  Negative for chest pain, palpitations and leg swelling.  Gastrointestinal: Negative.  Negative for heartburn, nausea and vomiting.  Genitourinary: Negative.   Musculoskeletal: Negative.  Negative for myalgias.  Skin: Negative.   Neurological: Negative.  Negative for dizziness, focal weakness, seizures and headaches.  Endo/Heme/Allergies: Negative.   Psychiatric/Behavioral: Positive for substance abuse (ETOH abuse ). Negative for suicidal ideas.    Past Medical History:  Diagnosis Date  . Alcohol abuse   . Blind left eye   . 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 Hepatitis    HEP C treated in 2016  . HTN (hypertension)     History reviewed. No pertinent surgical history.  History reviewed. No pertinent family history.  Social History Reviewed with no changes to be made today.   Outpatient Medications Prior to Visit  Medication Sig Dispense Refill  . amLODipine (NORVASC) 10 MG tablet Take 1 tablet (  10 mg total) by mouth daily. 30 tablet 6  . atropine 1 % ophthalmic solution Place 1 drop into the left eye 2 (two) times daily. 2 mL 1  . ibuprofen (ADVIL,MOTRIN) 600 MG tablet Take 1 tablet (600 mg total) by mouth every 8  (eight) hours as needed. 30 tablet 6  . latanoprost (XALATAN) 0.005 % ophthalmic solution Place 1 drop into the right eye at bedtime. 2.5 mL 1  . lisinopril-hydrochlorothiazide (PRINZIDE,ZESTORETIC) 20-25 MG tablet Take 1 tablet by mouth daily. 90 tablet 1  . methocarbamol (ROBAXIN) 500 MG tablet Take 1 tablet (500 mg total) by mouth every 6 (six) hours as needed for muscle spasms. 90 tablet 4  . prednisoLONE acetate (PRED FORTE) 1 % ophthalmic suspension Place 1 drop into the left eye 3 (three) times daily. 5 mL 1  . mupirocin ointment (BACTROBAN) 2 % Place 1 application into the nose 2 (two) times daily. (Patient not taking: Reported on 02/04/2019) 30 g 1  . sildenafil (VIAGRA) 100 MG tablet Take 0.5-1 tablets (50-100 mg total) by mouth daily as needed for up to 30 days for erectile dysfunction. (Patient not taking: Reported on 02/04/2019) 10 tablet prn   No facility-administered medications prior to visit.     Allergies  Allergen Reactions  . Acetaminophen Other (See Comments)    Causes eye pressure to elevate; CANNOT HAVE THIS       Objective:    BP (!) 147/73 (BP Location: Left Arm, Patient Position: Sitting, Cuff Size: Normal)   Pulse 84   Temp 98.6 F (37 C) (Oral)   Ht 5\' 11"  (1.803 m)   Wt 174 lb 3.2 oz (79 kg)   SpO2 93%   BMI 24.30 kg/m  Wt Readings from Last 3 Encounters:  02/04/19 174 lb 3.2 oz (79 kg)  01/13/19 178 lb (80.7 kg)  12/12/18 179 lb (81.2 kg)    Physical Exam Vitals signs and nursing note reviewed.  Constitutional:      Appearance: He is well-developed.  HENT:     Head: Normocephalic and atraumatic.     Right Ear: Hearing, tympanic membrane, ear canal and external ear normal.     Left Ear: Hearing, tympanic membrane, ear canal and external ear normal.     Nose: Nose normal. No mucosal edema or rhinorrhea.     Mouth/Throat:     Dentition: Abnormal dentition. Dental caries present.     Pharynx: Uvula midline.     Tonsils: No tonsillar exudate.  Swelling: 1+ on the right. 1+ on the left.  Eyes:     General: Lids are normal. No scleral icterus.    Conjunctiva/sclera: Conjunctivae normal.     Pupils: Pupils are equal, round, and reactive to light.   Neck:     Musculoskeletal: Normal range of motion and neck supple.     Thyroid: No thyromegaly.     Trachea: No tracheal deviation.  Cardiovascular:     Rate and Rhythm: Normal rate and regular rhythm.     Heart sounds: Normal heart sounds. No murmur. No friction rub. No gallop.   Pulmonary:     Effort: Pulmonary effort is normal. No tachypnea or respiratory distress.     Breath sounds: Normal breath sounds. No decreased breath sounds, wheezing, rhonchi or rales.  Chest:     Chest wall: No mass or tenderness.     Breasts:        Right: No inverted nipple, mass, nipple discharge, skin change or tenderness.  Left: No inverted nipple, mass, nipple discharge, skin change or tenderness.  Abdominal:     General: Bowel sounds are normal. There is no distension.     Palpations: Abdomen is soft. There is no mass.     Tenderness: There is no abdominal tenderness. There is no guarding or rebound.     Hernia: There is no hernia in the right inguinal area or left inguinal area.  Genitourinary:    Penis: Normal.      Scrotum/Testes: Normal.        Right: Mass, tenderness or swelling not present. Right testis is descended. Cremasteric reflex is present.         Left: Mass, tenderness or swelling not present. Left testis is descended. Cremasteric reflex is present.   Musculoskeletal: Normal range of motion.        General: No tenderness or deformity.  Feet:     Right foot:     Protective Sensation: 10 sites tested. 10 sites sensed.     Skin integrity: Callus and dry skin present.     Toenail Condition: Right toenails are abnormally thick.     Left foot:     Protective Sensation: 10 sites tested. 10 sites sensed.     Skin integrity: Callus and dry skin present.     Toenail Condition:  Left toenails are abnormally thick.  Lymphadenopathy:     Cervical: No cervical adenopathy.     Lower Body: No right inguinal adenopathy. No left inguinal adenopathy.  Skin:    General: Skin is warm and dry.     Capillary Refill: Capillary refill takes less than 2 seconds.     Findings: No erythema.  Neurological:     Mental Status: He is alert and oriented to person, place, and time.     Cranial Nerves: No cranial nerve deficit.     Motor: No abnormal muscle tone.     Coordination: Coordination normal.     Deep Tendon Reflexes: Reflexes normal.  Psychiatric:        Behavior: Behavior normal. Behavior is cooperative.        Thought Content: Thought content normal.        Judgment: Judgment normal.          Patient has been counseled extensively about nutrition and exercise as well as the importance of adherence with medications and regular follow-up. The patient was given clear instructions to go to ER or return to medical center if symptoms don't improve, worsen or new problems develop. The patient verbalized understanding.   Follow-up: Return in about 4 weeks (around 03/04/2019) for BP recheck fasting labs .   [name], FNP-BC [facility] and [place], [place]   02/07/2019, 12:12 AM

## 2019-02-05 LAB — CBC
Hematocrit: 39.4 % (ref 37.5–51.0)
Hemoglobin: 13.3 g/dL (ref 13.0–17.7)
MCH: 28.9 pg (ref 26.6–33.0)
MCHC: 33.8 g/dL (ref 31.5–35.7)
MCV: 86 fL (ref 79–97)
Platelets: 315 10*3/uL (ref 150–450)
RBC: 4.61 x10E6/uL (ref 4.14–5.80)
RDW: 13.9 % (ref 11.6–15.4)
WBC: 8.7 10*3/uL (ref 3.4–10.8)

## 2019-02-07 ENCOUNTER — Encounter: Payer: Self-pay | Admitting: Nurse Practitioner

## 2019-02-18 ENCOUNTER — Other Ambulatory Visit: Payer: Self-pay | Admitting: Critical Care Medicine

## 2019-02-18 ENCOUNTER — Encounter: Payer: Self-pay | Admitting: Critical Care Medicine

## 2019-02-18 DIAGNOSIS — I10 Essential (primary) hypertension: Secondary | ICD-10-CM

## 2019-02-18 DIAGNOSIS — J3489 Other specified disorders of nose and nasal sinuses: Secondary | ICD-10-CM

## 2019-02-18 MED ORDER — LISINOPRIL-HYDROCHLOROTHIAZIDE 20-25 MG PO TABS: 1.0000 | ORAL_TABLET | Freq: Every day | ORAL | 1 refills | Status: DC

## 2019-02-18 MED ORDER — ATROPINE SULFATE 1 % OP SOLN: 1.0000 [drp] | Freq: Two times a day (BID) | OPHTHALMIC | 2 refills | Status: DC

## 2019-02-18 MED ORDER — MUPIROCIN 2 % EX OINT: 1.0000 "application " | TOPICAL_OINTMENT | Freq: Two times a day (BID) | CUTANEOUS | 1 refills | Status: DC

## 2019-02-18 MED ORDER — LATANOPROST 0.005 % OP SOLN: 1.0000 [drp] | Freq: Every day | OPHTHALMIC | 1 refills | Status: DC

## 2019-02-18 MED ORDER — PREDNISOLONE ACETATE 1 % OP SUSP: 1.0000 [drp] | Freq: Three times a day (TID) | OPHTHALMIC | 1 refills | Status: DC

## 2019-02-18 MED ORDER — AMLODIPINE BESYLATE 10 MG PO TABS: 10.0000 mg | ORAL_TABLET | Freq: Every day | ORAL | 6 refills | Status: DC

## 2019-02-18 NOTE — Progress Notes (Signed)
Needed refills

## 2019-02-19 MED FILL — AMLODIPINE BESYLATE 10 MG T: 10 | 90 days supply | Qty: 90 | Fill #0

## 2019-02-19 MED FILL — PREDNISOLONE AC 1% EYE DROP: 1 | 25 days supply | Qty: 5 | Fill #0

## 2019-02-19 MED FILL — MUPIROCIN 2% OINTMENT: 2 | 7 days supply | Qty: 22 | Fill #0

## 2019-02-19 MED FILL — LATANOPROST 0.005% EYE DRP: 0.005 | 30 days supply | Qty: 3 | Fill #0

## 2019-02-19 MED FILL — ATROPINE 1% EYE DROPS: 1 | 15 days supply | Qty: 2 | Fill #0

## 2019-02-19 MED FILL — LISINOPRIL-HCTZ 20-25 MG TA: 20-25 | 90 days supply | Qty: 90 | Fill #0

## 2019-02-19 NOTE — Progress Notes (Signed)
This patient was seen in the [place] clinic and is requesting refills for his eyedrops hypertensive medications and bacitracin.  He states he is making progress in his housing and likely expects to be out of this shelter by the end of April.  [facility] but has need for his medicines to be refilled  [facility]ills on amlodipine 10 mg daily, atropine ophthalmic solution 1% 1 drop left eye 2 times daily, lisinopril HCT 20/25 1 daily, mupirocin ointment apply to the nose twice daily  The patient has a follow-up visit with his new primary care provider in March

## 2019-02-20 ENCOUNTER — Telehealth: Payer: Self-pay | Admitting: Nurse Practitioner

## 2019-02-20 NOTE — Telephone Encounter (Signed)
Patient came in and dropped off paperwork needs it filled out asap for housing. [phone]

## 2019-02-22 NOTE — Telephone Encounter (Signed)
7-10 day turn around

## 2019-02-23 NOTE — Telephone Encounter (Signed)
CMA spoke to patient to inform paper work Is ready for pick up.  Pt. Verified DOB. Pt. Understood.

## 2019-03-10 ENCOUNTER — Ambulatory Visit: Payer: Medicare Other | Attending: Nurse Practitioner | Admitting: Nurse Practitioner

## 2019-03-10 ENCOUNTER — Other Ambulatory Visit: Payer: Self-pay

## 2019-03-10 ENCOUNTER — Encounter: Payer: Self-pay | Admitting: Nurse Practitioner

## 2019-03-10 VITALS — BP 113/63 | HR 81 | Temp 98.8°F | Ht 71.0 in | Wt 178.2 lb

## 2019-03-10 DIAGNOSIS — R6889 Other general symptoms and signs: Secondary | ICD-10-CM

## 2019-03-10 DIAGNOSIS — M25511 Pain in right shoulder: Secondary | ICD-10-CM

## 2019-03-10 DIAGNOSIS — G8929 Other chronic pain: Secondary | ICD-10-CM

## 2019-03-10 DIAGNOSIS — M79661 Pain in right lower leg: Secondary | ICD-10-CM

## 2019-03-10 DIAGNOSIS — H409 Unspecified glaucoma: Secondary | ICD-10-CM

## 2019-03-10 DIAGNOSIS — I10 Essential (primary) hypertension: Secondary | ICD-10-CM | POA: Diagnosis not present

## 2019-03-10 LAB — POCT ABI - SCREENING FOR PILOT NO CHARGE: Immediate ABI left: 0.95

## 2019-03-10 MED ORDER — IBUPROFEN 600 MG PO TABS: 600.0000 mg | ORAL_TABLET | Freq: Three times a day (TID) | ORAL | 6 refills | Status: DC | PRN

## 2019-03-10 MED ORDER — ATROPINE SULFATE 1 % OP SOLN: 1.0000 [drp] | Freq: Two times a day (BID) | OPHTHALMIC | 2 refills | Status: DC

## 2019-03-10 MED FILL — IBUPROFEN 600 MG TABLET: 600 | 10 days supply | Qty: 30 | Fill #0

## 2019-03-10 MED FILL — ATROPINE 1% EYE DROPS: 1 | 37 days supply | Qty: 5 | Fill #0

## 2019-03-10 NOTE — Progress Notes (Signed)
Assessment & Plan:  Jeffrey Mullen was seen today for follow-up.  Diagnoses and all orders for this visit:  Essential hypertension -     Basic metabolic panel Continue all antihypertensives as prescribed.  Remember to bring in your blood pressure log with you for your follow up appointment.  DASH/Mediterranean Diets are healthier choices for HTN.    Right calf pain -     POCT ABI Screening Pilot No Charge -     Ambulatory referral to Vascular Surgery  Abnormal ankle brachial index (ABI) -     Ambulatory referral to Vascular Surgery  Glaucoma of both eyes, unspecified glaucoma type -     atropine 1 % ophthalmic solution; Place 1 drop into the left eye 2 (two) times daily.  Chronic right shoulder pain -     ibuprofen (ADVIL,MOTRIN) 600 MG tablet; Take 1 tablet (600 mg total) by mouth every 8 (eight) hours as needed.    Patient has been counseled on age-appropriate routine health concerns for screening and prevention. These are reviewed and up-to-date. Referrals have been placed accordingly. Immunizations are up-to-date or declined.    Subjective:   Chief Complaint  Patient presents with  . Follow-up    Pt. is here for BP check up and fasting for lab.    HPI Jeffrey Mullen 68 y.o. male presents to office today for BP recheck and labs.   Essential Hypertension Blood pressure is well controlled today taking lisinopril-hctz 20-25mg  daily and amlodipine 10mg  daily Denies chest pain, shortness of breath, palpitations, lightheadedness, dizziness, headaches or BLE edema. He stays in a homeless shelter so he does not have a lot of control over his diet there. "I have to eat what's given".  BP Readings from Last 3 Encounters:  03/10/19 113/63  02/04/19 (!) 147/73  01/13/19 (!) 125/58    LEG PAIN Patient complains of right calf pain for which has been present for several  months. Pain is described as aching, stabbing and throbbing, and is intermittent .  He reports pain in right  calf with walking which resolves with rest within 5-10 minutes.  Walking over 2 blocks aggravates the pain. Started smoking at 68 years of age. Smoking now down to 2-4 cigarettes per day. Most he has ever smoked was 1/2 ppd.   Review of Systems  Constitutional: Negative for fever, malaise/fatigue and weight loss.  HENT: Negative.  Negative for nosebleeds.   Eyes: Positive for blurred vision. Negative for double vision, photophobia, pain, discharge and redness.  Respiratory: Negative.  Negative for cough and shortness of breath.   Cardiovascular: Positive for claudication. Negative for chest pain, palpitations and leg swelling.  Gastrointestinal: Negative.  Negative for heartburn, nausea and vomiting.  Musculoskeletal: Negative for myalgias.       SEE HPI  Neurological: Negative.  Negative for dizziness, focal weakness, seizures and headaches.  Psychiatric/Behavioral: Negative.  Negative for suicidal ideas.    Past Medical History:  Diagnosis Date  . Alcohol abuse   . Blind left eye   . Cataract, right eye   . Congenital blindness    LEFT EYE  . Glaucoma, bilateral   . 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 Hepatitis    HEP C treated in 2016  . HTN (hypertension)     History reviewed. No pertinent surgical history.  Family History  Problem Relation Age of Onset  . Hearing loss Other   . Kidney disease Neg Hx     Social History Reviewed with no changes  to be made today.   Outpatient Medications Prior to Visit  Medication Sig Dispense Refill  . amLODipine (NORVASC) 10 MG tablet Take 1 tablet (10 mg total) by mouth daily. 30 tablet 6  . latanoprost (XALATAN) 0.005 % ophthalmic solution Place 1 drop into the right eye at bedtime. 2.5 mL 1  . lisinopril-hydrochlorothiazide (PRINZIDE,ZESTORETIC) 20-25 MG tablet Take 1 tablet by mouth daily. 90 tablet 1  . methocarbamol (ROBAXIN) 500 MG tablet Take 1 tablet (500 mg total) by mouth every 6 (six) hours as needed for muscle spasms. 90 tablet 4   . mupirocin ointment (BACTROBAN) 2 % Place 1 application into the nose 2 (two) times daily. 30 g 1  . prednisoLONE acetate (PRED FORTE) 1 % ophthalmic suspension Place 1 drop into the left eye 3 (three) times daily. 5 mL 1  . atropine 1 % ophthalmic solution Place 1 drop into the left eye 2 (two) times daily. 5 mL 2  . ibuprofen (ADVIL,MOTRIN) 600 MG tablet Take 1 tablet (600 mg total) by mouth every 8 (eight) hours as needed. 30 tablet 6  . sildenafil (VIAGRA) 100 MG tablet Take 0.5-1 tablets (50-100 mg total) by mouth daily as needed for up to 30 days for erectile dysfunction. (Patient not taking: Reported on 02/04/2019) 10 tablet prn   No facility-administered medications prior to visit.     Allergies  Allergen Reactions  . Acetaminophen Other (See Comments)    Causes eye pressure to elevate; CANNOT HAVE THIS       Objective:    BP 113/63 (BP Location: Left Arm, Patient Position: Sitting, Cuff Size: Normal)   Pulse 81   Temp 98.8 F (37.1 C) (Oral)   Ht 5\' 11"  (1.803 m)   Wt 178 lb 3.2 oz (80.8 kg)   SpO2 97%   BMI 24.85 kg/m  Wt Readings from Last 3 Encounters:  03/10/19 178 lb 3.2 oz (80.8 kg)  02/04/19 174 lb 3.2 oz (79 kg)  01/13/19 178 lb (80.7 kg)    Physical Exam Vitals signs and nursing note reviewed.  Constitutional:      Appearance: He is well-developed.  HENT:     Head: Normocephalic and atraumatic.  Neck:     Musculoskeletal: Normal range of motion.  Cardiovascular:     Rate and Rhythm: Normal rate and regular rhythm.     Heart sounds: Murmur present. No friction rub. No gallop.   Pulmonary:     Effort: Pulmonary effort is normal. No tachypnea or respiratory distress.     Breath sounds: Normal breath sounds. No decreased breath sounds, wheezing, rhonchi or rales.  Chest:     Chest wall: No tenderness.  Abdominal:     General: Bowel sounds are normal.     Palpations: Abdomen is soft.  Musculoskeletal: Normal range of motion.  Skin:    General: Skin  is warm and dry.  Neurological:     Mental Status: He is alert and oriented to person, place, and time.     Coordination: Coordination normal.  Psychiatric:        Behavior: Behavior normal. Behavior is cooperative.        Thought Content: Thought content normal.        Judgment: Judgment normal.          Patient has been counseled extensively about nutrition and exercise as well as the importance of adherence with medications and regular follow-up. The patient was given clear instructions to go to ER or return to  medical center if symptoms don't improve, worsen or new problems develop. The patient verbalized understanding.   Follow-up: Return in about 3 months (around 06/10/2019) for htn.   [name], FNP-BC [facility] and [facility] [place], [place]   03/11/2019, 11:22 AM

## 2019-03-11 ENCOUNTER — Encounter: Payer: Self-pay | Admitting: Nurse Practitioner

## 2019-03-11 ENCOUNTER — Encounter: Payer: Self-pay | Admitting: Pediatric Intensive Care

## 2019-03-11 ENCOUNTER — Other Ambulatory Visit: Payer: Self-pay | Admitting: Critical Care Medicine

## 2019-03-11 ENCOUNTER — Encounter: Payer: Self-pay | Admitting: Critical Care Medicine

## 2019-03-11 LAB — BASIC METABOLIC PANEL
BUN/Creatinine Ratio: 9 — ABNORMAL LOW (ref 10–24)
BUN: 10 mg/dL (ref 8–27)
CO2: 24 mmol/L (ref 20–29)
Calcium: 10.1 mg/dL (ref 8.6–10.2)
Chloride: 99 mmol/L (ref 96–106)
Creatinine, Ser: 1.13 mg/dL (ref 0.76–1.27)
GFR calc Af Amer: 78 mL/min/{1.73_m2} (ref >59)
GFR calc non Af Amer: 68 mL/min/{1.73_m2} (ref >59)
Glucose: 92 mg/dL (ref 65–99)
Potassium: 3.9 mmol/L (ref 3.5–5.2)
SODIUM: 141 mmol/L (ref 134–144)

## 2019-03-11 NOTE — Progress Notes (Signed)
No meds   Confirmed vascular surgery appt.   For low ABI

## 2019-03-12 NOTE — Progress Notes (Signed)
This patient was seen today in the [place] clinic with questions regarding a recent study done in the primary care office and as well to pick up his eyedrops and ibuprofen prescriptions.  In reviewing the patient's chart he had an ABI screening study done showing potential peripheral arterial disease in the right lower extremity.  I examined the right lower extremity and there are decreased decreased pulses in the foot and behind the knee.  It does appear a vascular surgery consultation was made.  I called the patient's primary care provider and they stated they had told the patient results of the study and explained the results and the referral.  It appears the patient did not really process this information and after we explained to the patient he understood.  He is awaiting a call for the date for the vascular surgery appointment.  No other concerns were voiced at this time.

## 2019-03-25 ENCOUNTER — Other Ambulatory Visit: Payer: Self-pay | Admitting: Critical Care Medicine

## 2019-03-25 DIAGNOSIS — H409 Unspecified glaucoma: Secondary | ICD-10-CM

## 2019-03-25 MED ORDER — LATANOPROST 0.005 % OP SOLN: 1.0000 [drp] | Freq: Every day | OPHTHALMIC | 1 refills | Status: DC

## 2019-03-25 MED ORDER — ATROPINE SULFATE 1 % OP SOLN: 1.0000 [drp] | Freq: Two times a day (BID) | OPHTHALMIC | 2 refills | Status: DC

## 2019-03-25 MED FILL — ATROPINE 1% EYE DROPS: 1 | 30 days supply | Qty: 5 | Fill #0

## 2019-03-25 MED FILL — LATANOPROST 0.005% EYE DRP: 0.005 | 90 days supply | Qty: 8 | Fill #0

## 2019-03-25 NOTE — Progress Notes (Signed)
Needs refills on eye drops

## 2019-03-31 NOTE — Congregational Nurse Program (Signed)
  Dept: [facility] Nurse Program Note  Date of Encounter: 03/11/2019  Past Medical History: Past Medical History:  Diagnosis Date  . Alcohol abuse   . Blind left eye   . Cataract, right eye   . Congenital blindness    LEFT EYE  . Glaucoma, bilateral   . 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 Hepatitis    HEP C treated in 2016  . HTN (hypertension)     Encounter Details:Appointment with Dr Joya Gaskins Lisette Abu RN BSN CNP [phone]

## 2019-04-17 ENCOUNTER — Other Ambulatory Visit: Payer: Self-pay | Admitting: Critical Care Medicine

## 2019-04-17 DIAGNOSIS — G8929 Other chronic pain: Secondary | ICD-10-CM

## 2019-04-17 DIAGNOSIS — M25511 Pain in right shoulder: Principal | ICD-10-CM

## 2019-04-17 MED ORDER — IBUPROFEN 600 MG PO TABS: 600.0000 mg | ORAL_TABLET | Freq: Three times a day (TID) | ORAL | 0 refills | Status: DC | PRN

## 2019-04-17 MED FILL — IBUPROFEN 600 MG TABLET: 600 | 20 days supply | Qty: 60 | Fill #0

## 2019-05-03 ENCOUNTER — Emergency Department (HOSPITAL_COMMUNITY): Payer: Medicare Other

## 2019-05-03 ENCOUNTER — Encounter (HOSPITAL_COMMUNITY): Payer: Self-pay

## 2019-05-03 ENCOUNTER — Other Ambulatory Visit: Payer: Self-pay

## 2019-05-03 ENCOUNTER — Emergency Department (HOSPITAL_COMMUNITY)
Admission: EM | Admit: 2019-05-03 | Discharge: 2019-05-04 | Disposition: A | Discharge status: Home / Self Care | Payer: Medicare Other | Attending: Emergency Medicine | Admitting: Emergency Medicine

## 2019-05-03 DIAGNOSIS — R0789 Other chest pain: Secondary | ICD-10-CM | POA: Diagnosis not present

## 2019-05-03 DIAGNOSIS — F17210 Nicotine dependence, cigarettes, uncomplicated: Secondary | ICD-10-CM | POA: Diagnosis not present

## 2019-05-03 DIAGNOSIS — W19XXXA Unspecified fall, initial encounter: Secondary | ICD-10-CM | POA: Diagnosis not present

## 2019-05-03 DIAGNOSIS — Z79899 Other long term (current) drug therapy: Secondary | ICD-10-CM | POA: Insufficient documentation

## 2019-05-03 DIAGNOSIS — R079 Chest pain, unspecified: Secondary | ICD-10-CM

## 2019-05-03 DIAGNOSIS — I10 Essential (primary) hypertension: Secondary | ICD-10-CM | POA: Diagnosis not present

## 2019-05-03 DIAGNOSIS — G8929 Other chronic pain: Secondary | ICD-10-CM

## 2019-05-03 LAB — COMPREHENSIVE METABOLIC PANEL
ALT: 37 U/L (ref 0–44)
AST: 53 U/L — ABNORMAL HIGH (ref 15–41)
Albumin: 3.9 g/dL (ref 3.5–5.0)
Alkaline Phosphatase: 73 U/L (ref 38–126)
Anion gap: 12 (ref 5–15)
BUN: 8 mg/dL (ref 8–23)
CO2: 25 mmol/L (ref 22–32)
Calcium: 9.3 mg/dL (ref 8.9–10.3)
Chloride: 108 mmol/L (ref 98–111)
Creatinine, Ser: 1 mg/dL (ref 0.61–1.24)
GFR calc Af Amer: >60 mL/min (ref >60)
GFR calc non Af Amer: >60 mL/min (ref >60)
Glucose, Bld: 84 mg/dL (ref 70–99)
Potassium: 3.5 mmol/L (ref 3.5–5.1)
Sodium: 145 mmol/L (ref 135–145)
Total Bilirubin: 0.2 mg/dL — ABNORMAL LOW (ref 0.3–1.2)
Total Protein: 7.2 g/dL (ref 6.5–8.1)

## 2019-05-03 LAB — CBC
HCT: 32.6 % — ABNORMAL LOW (ref 39.0–52.0)
Hemoglobin: 11 g/dL — ABNORMAL LOW (ref 13.0–17.0)
MCH: 30 pg (ref 26.0–34.0)
MCHC: 33.7 g/dL (ref 30.0–36.0)
MCV: 88.8 fL (ref 80.0–100.0)
Platelets: 149 10*3/uL — ABNORMAL LOW (ref 150–400)
RBC: 3.67 MIL/uL — ABNORMAL LOW (ref 4.22–5.81)
RDW: 15 % (ref 11.5–15.5)
WBC: 5.6 10*3/uL (ref 4.0–10.5)
nRBC: 0 % (ref 0.0–0.2)

## 2019-05-03 LAB — TROPONIN I: Troponin I: <0.03 ng/mL (ref <0.03)

## 2019-05-03 MED ORDER — KETOROLAC TROMETHAMINE 15 MG/ML IJ SOLN
15.0000 mg | Freq: Once | INTRAMUSCULAR | Status: AC
  Administered 2019-05-03: 23:00:00 15 mg via INTRAVENOUS
  Filled 2019-05-03: qty 1

## 2019-05-03 NOTE — ED Notes (Signed)
RN will start IV and collect labs 

## 2019-05-03 NOTE — ED Triage Notes (Addendum)
Pt presents to the ED with complaint of chest pain. Onset today at 1400. Pain is located in the right chest and sharp in nature. Pain does not radiate. Pt currently rates pain 8/10. Pt received 324 ASA prior to arrival and sublingual nitro x1 prior to arrival without improvement of pain. Pt has not taken blood pressure medication for the past 5 days. Pt notes alcohol use tonight.

## 2019-05-03 NOTE — ED Provider Notes (Signed)
[facility] EMERGENCY DEPARTMENT Provider Note   CSN: [id number] Arrival date & time: 05/03/19  2147    History   Chief Complaint Chief Complaint  Patient presents with  . Chest Pain    HPI Jeffrey Mullen is a 68 y.o. male.     Patient with history of HTN presents with complaint of right sided chest pain that started early in the day yesterday. No cough, fever, congestion or injury. The pain has been constant and non-radiating since onset. No nausea, vomiting, diaphoresis. He admits to alcohol use today. He states he received aspirin and nitro x 1 by EMS without change to the pain. He was concerned that he has been out of his blood pressure medications for the past week.   The history is provided by the patient. No language interpreter was used.  Chest Pain  Associated symptoms: no abdominal pain, no cough, no diaphoresis, no fever, no nausea, no shortness of breath and no weakness     Past Medical History:  Diagnosis Date  . Alcohol abuse   . Blind left eye   . Cataract, right eye   . Congenital blindness    LEFT EYE  . Glaucoma, bilateral   . Hepatic cirrhosis ([place])   . Hepatitis    HEP C treated in 2016  . HTN (hypertension)     Patient Active Problem List   Diagnosis Date Noted  . Acute alcoholic intoxication without complication ([place]) 94/17/4081  . Cataract Right eye 12/12/2018  . Glaucoma, congenital, blind Left eye  12/12/2018  . Lives in homeless shelter 12/12/2018  . Shoulder pain, right 12/12/2018  . Tobacco abuse   . ETOH abuse   . Benign essential HTN   . Hepatic cirrhosis ([place]) 02/10/2015  . Chronic hepatitis C without hepatic coma ([place]) 01/05/2015    History reviewed. No pertinent surgical history.      Home Medications    Prior to Admission medications   Medication Sig Start Date End Date Taking? Authorizing Provider  amLODipine (NORVASC) 10 MG tablet Take 1 tablet (10 mg total) by mouth daily. 02/18/19   Elsie Stain, MD  atropine 1 % ophthalmic solution Place 1 drop into the left eye 2 (two) times daily. 03/25/19   Elsie Stain, MD  ibuprofen (ADVIL) 600 MG tablet Take 1 tablet (600 mg total) by mouth every 8 (eight) hours as needed for moderate pain. 04/17/19   Elsie Stain, MD  latanoprost (XALATAN) 0.005 % ophthalmic solution Place 1 drop into the right eye at bedtime. 03/25/19   Elsie Stain, MD  lisinopril-hydrochlorothiazide (PRINZIDE,ZESTORETIC) 20-25 MG tablet Take 1 tablet by mouth daily. 02/18/19 05/19/19  Elsie Stain, MD  methocarbamol (ROBAXIN) 500 MG tablet Take 1 tablet (500 mg total) by mouth every 6 (six) hours as needed for muscle spasms. 12/12/18   Elsie Stain, MD  mupirocin ointment (BACTROBAN) 2 % Place 1 application into the nose 2 (two) times daily. 02/18/19   Elsie Stain, MD  prednisoLONE acetate (PRED FORTE) 1 % ophthalmic suspension Place 1 drop into the left eye 3 (three) times daily. 02/18/19   Elsie Stain, MD  sildenafil (VIAGRA) 100 MG tablet Take 0.5-1 tablets (50-100 mg total) by mouth daily as needed for up to 30 days for erectile dysfunction. Patient not taking: Reported on 02/04/2019 01/13/19 02/12/19  [name], NP    Family History Family History  Problem Relation Age of Onset  . Hearing  loss Other   . Kidney disease Neg Hx     Social History Social History   Tobacco Use  . Smoking status: Current Some Day Smoker    Types: Cigarettes  . Smokeless tobacco: Never Used  Substance Use Topics  . Alcohol use: No    Alcohol/week: 0.0 standard drinks  . Drug use: No     Allergies   Acetaminophen   Review of Systems Review of Systems  Constitutional: Negative for chills, diaphoresis and fever.  HENT: Negative.   Respiratory: Negative.  Negative for cough and shortness of breath.   Cardiovascular: Positive for chest pain.  Gastrointestinal: Negative.  Negative for abdominal pain and nausea.  Musculoskeletal:  Negative.  Negative for myalgias.  Skin: Negative.   Neurological: Negative.  Negative for weakness.     Physical Exam Updated Vital Signs BP (!) 144/80   Pulse 83   Temp 97.9 F (36.6 C) (Oral)   Resp 13   Ht 5\' 11"  (1.803 m)   Wt 80.7 kg   SpO2 96%   BMI 24.83 kg/m   Physical Exam Vitals signs and nursing note reviewed.  Constitutional:      Appearance: He is well-developed.  HENT:     Head: Normocephalic.  Neck:     Musculoskeletal: Normal range of motion and neck supple.  Cardiovascular:     Rate and Rhythm: Normal rate and regular rhythm.  Pulmonary:     Effort: Pulmonary effort is normal.     Breath sounds: Normal breath sounds. No wheezing, rhonchi or rales.     Comments: Right sided chest tenderness.  Abdominal:     General: Bowel sounds are normal.     Palpations: Abdomen is soft.     Tenderness: There is no abdominal tenderness. There is no guarding or rebound.  Musculoskeletal: Normal range of motion.     Right lower leg: No edema.     Left lower leg: No edema.  Skin:    General: Skin is warm and dry.     Findings: No rash.  Neurological:     General: No focal deficit present.     Mental Status: He is alert and oriented to person, place, and time.      ED Treatments / Results  Labs (all labs ordered are listed, but only abnormal results are displayed) Labs Reviewed  CBC - Abnormal; Notable for the following components:      Result Value   RBC 3.67 (*)    Hemoglobin 11.0 (*)    HCT 32.6 (*)    Platelets 149 (*)    All other components within normal limits  COMPREHENSIVE METABOLIC PANEL  TROPONIN I    EKG EKG Interpretation  Date/Time:  Sunday May 03 2019 21:56:26 EDT Ventricular Rate:  81 PR Interval:    QRS Duration: 81 QT Interval:  362 QTC Calculation: 421 R Axis:   60 Text Interpretation:  Sinus rhythm Anteroseptal infarct, old Confirmed by Gerlene Fee [phone]) on 05/03/2019 10:00:10 PM   Radiology Dg Chest 2 View  Result  Date: 05/03/2019 CLINICAL DATA:  68 year old male with chest pain EXAM: CHEST - 2 VIEW COMPARISON:  Chest radiograph dated 11/01/2018 FINDINGS: The heart size and mediastinal contours are within normal limits. Both lungs are clear. The visualized skeletal structures are unremarkable. IMPRESSION: No active cardiopulmonary disease. Electronically Signed   By: Anner Crete M.D.   On: 05/03/2019 22:36    Procedures Procedures (including critical care time)  Medications Ordered in ED  Medications - No data to display   Initial Impression / Assessment and Plan / ED Course  I have reviewed the triage vital signs and the nursing notes.  Pertinent labs & imaging results that were available during my care of the patient were reviewed by me and considered in my medical decision making (see chart for details).        Patient to ED with right sided chest pain x 1 day. Out of blood pressure medication and was concerned this was contributing cause of chest pain.   The patient appears in NAD. No cough. Breathing easy, chest tender to palpation.   EKG without acute changes. CXR clear. Labs, including delta troponin are unremarkable. VSS.   Feel his symptoms are c/w chest wall pain. He can be discharged home with ibuprofen, PCP follow up.   Final Clinical Impressions(s) / ED Diagnoses   Final diagnoses:  Chest pain   1. Chest wall pain  ED Discharge Orders    None       Charlann Lange, Hershal Coria 05/04/19 0236    Maudie Flakes, MD 05/06/19 1616

## 2019-05-04 DIAGNOSIS — R0789 Other chest pain: Secondary | ICD-10-CM | POA: Diagnosis not present

## 2019-05-04 LAB — TROPONIN I: Troponin I: <0.03 ng/mL (ref <0.03)

## 2019-05-04 MED ORDER — IBUPROFEN 600 MG PO TABS: 600.0000 mg | ORAL_TABLET | Freq: Three times a day (TID) | ORAL | 0 refills | Status: DC | PRN

## 2019-05-04 NOTE — Discharge Instructions (Addendum)
Your tests here on your heart are all negative. You do not have any evidence of a heart problem or infection in your lungs. Your pain is likely in the muscle of the chest wall.   Use heat and ibuprofen as directed in your printed instructions. Follow up with your doctor in 3 days for recheck if no better.

## 2019-05-06 ENCOUNTER — Emergency Department (HOSPITAL_COMMUNITY): Payer: Medicare Other

## 2019-05-06 ENCOUNTER — Other Ambulatory Visit: Payer: Self-pay

## 2019-05-06 ENCOUNTER — Emergency Department (HOSPITAL_COMMUNITY)
Admission: EM | Admit: 2019-05-06 | Discharge: 2019-05-06 | Disposition: A | Discharge status: Home / Self Care | Payer: Medicare Other | Attending: Emergency Medicine | Admitting: Emergency Medicine

## 2019-05-06 DIAGNOSIS — R05 Cough: Secondary | ICD-10-CM | POA: Diagnosis not present

## 2019-05-06 DIAGNOSIS — R079 Chest pain, unspecified: Secondary | ICD-10-CM | POA: Diagnosis not present

## 2019-05-06 DIAGNOSIS — G8929 Other chronic pain: Secondary | ICD-10-CM | POA: Insufficient documentation

## 2019-05-06 DIAGNOSIS — R0781 Pleurodynia: Secondary | ICD-10-CM | POA: Diagnosis not present

## 2019-05-06 DIAGNOSIS — R52 Pain, unspecified: Secondary | ICD-10-CM | POA: Diagnosis not present

## 2019-05-06 DIAGNOSIS — R0789 Other chest pain: Secondary | ICD-10-CM | POA: Diagnosis not present

## 2019-05-06 DIAGNOSIS — M25511 Pain in right shoulder: Secondary | ICD-10-CM | POA: Insufficient documentation

## 2019-05-06 DIAGNOSIS — F17210 Nicotine dependence, cigarettes, uncomplicated: Secondary | ICD-10-CM | POA: Insufficient documentation

## 2019-05-06 DIAGNOSIS — I10 Essential (primary) hypertension: Secondary | ICD-10-CM | POA: Insufficient documentation

## 2019-05-06 DIAGNOSIS — R059 Cough, unspecified: Secondary | ICD-10-CM

## 2019-05-06 LAB — CBC
HCT: 29.8 % — ABNORMAL LOW (ref 39.0–52.0)
Hemoglobin: 10 g/dL — ABNORMAL LOW (ref 13.0–17.0)
MCH: 30.6 pg (ref 26.0–34.0)
MCHC: 33.6 g/dL (ref 30.0–36.0)
MCV: 91.1 fL (ref 80.0–100.0)
Platelets: 186 10*3/uL (ref 150–400)
RBC: 3.27 MIL/uL — ABNORMAL LOW (ref 4.22–5.81)
RDW: 15.7 % — ABNORMAL HIGH (ref 11.5–15.5)
WBC: 4.9 10*3/uL (ref 4.0–10.5)
nRBC: 0 % (ref 0.0–0.2)

## 2019-05-06 LAB — BASIC METABOLIC PANEL
Anion gap: 8 (ref 5–15)
BUN: 6 mg/dL — ABNORMAL LOW (ref 8–23)
CO2: 24 mmol/L (ref 22–32)
Calcium: 8.3 mg/dL — ABNORMAL LOW (ref 8.9–10.3)
Chloride: 112 mmol/L — ABNORMAL HIGH (ref 98–111)
Creatinine, Ser: 0.91 mg/dL (ref 0.61–1.24)
GFR calc Af Amer: >60 mL/min (ref >60)
GFR calc non Af Amer: >60 mL/min (ref >60)
Glucose, Bld: 100 mg/dL — ABNORMAL HIGH (ref 70–99)
Potassium: 3.6 mmol/L (ref 3.5–5.1)
Sodium: 144 mmol/L (ref 135–145)

## 2019-05-06 LAB — CK: Total CK: 370 U/L (ref 49–397)

## 2019-05-06 LAB — TROPONIN I: Troponin I: <0.03 ng/mL (ref <0.03)

## 2019-05-06 LAB — ETHANOL: Alcohol, Ethyl (B): 309 mg/dL (ref <10)

## 2019-05-06 MED ORDER — KETOROLAC TROMETHAMINE 15 MG/ML IJ SOLN
15.0000 mg | Freq: Once | INTRAMUSCULAR | Status: AC
  Administered 2019-05-06: 15 mg via INTRAMUSCULAR
  Filled 2019-05-06: qty 1

## 2019-05-06 NOTE — ED Provider Notes (Signed)
[facility] Emergency Department Provider Note MRN:  [id number]  Arrival date & time: 05/06/19     Chief Complaint   Chest Pain   History of Present Illness   Jeffrey Mullen is a 68 y.o. year-old male with a history of hypertension, alcohol abuse, hepatitis presenting to the ED with chief complaint of shoulder pain.  Pain is located in the right chest and right shoulder.  Pain is been constant for the past several days.  Was seen in the emergency department 3 days ago for the same pain and received a negative work-up.  Patient explains that during that visit he had been drinking alcohol that day, but states that he did not drink today.  Explains that he took ibuprofen 2 days ago as he was told, but none since.  Pain is worse with movement of the right shoulder.  Patient denies dizziness, no diaphoresis, no nausea, no vomiting, no shortness of breath.  Pain is sharp, constant.  4 out of 10 in severity.  Review of Systems  A complete 10 system review of systems was obtained and all systems are negative except as noted in the HPI and PMH.   Patient's Health History    Past Medical History:  Diagnosis Date  . Alcohol abuse   . Blind left eye   . Cataract, right eye   . Congenital blindness    LEFT EYE  . Glaucoma, bilateral   . Hepatic cirrhosis ([place])   . Hepatitis    HEP C treated in 2016  . HTN (hypertension)     No past surgical history on file.  Family History  Problem Relation Age of Onset  . Hearing loss Other   . Kidney disease Neg Hx     Social History   Socioeconomic History  . Marital status: Single    Spouse name: Not on file  . Number of children: Not on file  . Years of education: Not on file  . Highest education level: Not on file  Occupational History  . Not on file  Social Needs  . Financial resource strain: Not on file  . Food insecurity:    Worry: Not on file    Inability: Not on file  . Transportation needs:    Medical: Not  on file    Non-medical: Not on file  Tobacco Use  . Smoking status: Current Some Day Smoker    Types: Cigarettes  . Smokeless tobacco: Never Used  Substance and Sexual Activity  . Alcohol use: No    Alcohol/week: 0.0 standard drinks  . Drug use: No  . Sexual activity: Not Currently  Lifestyle  . Physical activity:    Days per week: Not on file    Minutes per session: Not on file  . Stress: Not on file  Relationships  . Social connections:    Talks on phone: Not on file    Gets together: Not on file    Attends religious service: Not on file    Active member of club or organization: Not on file    Attends meetings of clubs or organizations: Not on file    Relationship status: Not on file  . Intimate partner violence:    Fear of current or ex partner: Not on file    Emotionally abused: Not on file    Physically abused: Not on file    Forced sexual activity: Not on file  Other Topics Concern  . Not on file  Social  History Narrative  . Not on file     Physical Exam  Vital Signs and Nursing Notes reviewed Vitals:   05/06/19 2138 05/06/19 2200  BP: (!) 143/81 136/73  Pulse: 82 76  Resp: 16 14  Temp:    SpO2: 99% 96%    CONSTITUTIONAL: Chronically ill-appearing, NAD NEURO:  Alert and oriented x 3, no focal deficits EYES:  eyes equal and reactive ENT/NECK:  no LAD, no JVD CARDIO: Regular rate, well-perfused, normal S1 and S2 PULM:  CTAB no wheezing or rhonchi GI/GU:  normal bowel sounds, non-distended, non-tender MSK/SPINE:  No gross deformities, no edema SKIN:  no rash, atraumatic PSYCH:  Appropriate speech and behavior  Diagnostic and Interventional Summary    EKG Interpretation  Date/Time:  Wednesday May 06 2019 21:43:33 EDT Ventricular Rate:  82 PR Interval:    QRS Duration: 88 QT Interval:  374 QTC Calculation: 437 R Axis:   78 Text Interpretation:  Sinus rhythm Probable left atrial enlargement Probable anteroseptal infarct, old Confirmed by Gerlene Fee [phone]) on 05/06/2019 9:45:54 PM      Labs Reviewed  CBC - Abnormal; Notable for the following components:      Result Value   RBC 3.27 (*)    Hemoglobin 10.0 (*)    HCT 29.8 (*)    RDW 15.7 (*)    All other components within normal limits  BASIC METABOLIC PANEL - Abnormal; Notable for the following components:   Chloride 112 (*)    Glucose, Bld 100 (*)    BUN 6 (*)    Calcium 8.3 (*)    All other components within normal limits  ETHANOL - Abnormal; Notable for the following components:   Alcohol, Ethyl (B) 309 (*)    All other components within normal limits  TROPONIN I  CK    DG Chest Port 1 View  Final Result      Medications  ketorolac (TORADOL) 15 MG/ML injection 15 mg (15 mg Intramuscular Given 05/06/19 2152)     Procedures Critical Care  ED Course and Medical Decision Making  I have reviewed the triage vital signs and the nursing notes.  Pertinent labs & imaging results that were available during my care of the patient were reviewed by me and considered in my medical decision making (see below for details).  Suspect MSK pain in this 68 year old male here with right-sided chest pain and shoulder pain.  This is very atypical pain and the concern for cardiac etiology is low.  EKG is again without ischemic concerns.  Legs show no signs of DVT, patient is without shortness of breath, no hypoxia, little to no concern for pulmonary embolism.  Troponin pending.  Labs are unremarkable, troponin is negative.  Ethanol level greater than 300.  I suspect some level of malingering.  Appropriate for discharge.  After the discussed management above, the patient was determined to be safe for discharge.  The patient was in agreement with this plan and all questions regarding their care were answered.  ED return precautions were discussed and the patient will return to the ED with any significant worsening of condition.  Barth Kirks. Sedonia Small, MD [place] [email or web address]  Final Clinical Impressions(s) / ED Diagnoses     ICD-10-CM   1. Chronic right shoulder pain M25.511    G89.29   2. Cough R05 DG Chest [facility] 1 View    DG Chest Port 1 View  3. Chest pain, unspecified type  R07.9     ED Discharge Orders    None         Kollyn, [place], MD 05/06/19 2256

## 2019-05-06 NOTE — Discharge Instructions (Addendum)
You were evaluated in the Emergency Department and after careful evaluation, we did not find any emergent condition requiring admission or further testing in the hospital.  Your testing today was reassuring with no signs of heart damage.  Please return to the Emergency Department if you experience any worsening of your condition.  We encourage you to follow up with a primary care provider.  Thank you for allowing [place] to be a part of your care.

## 2019-05-06 NOTE — ED Triage Notes (Signed)
Pt reports with right chest pain under the right breast that does not radiate. Rates it an 8/10 and describes it as sharp. Has not taken anything today for it. Reports being a smoker. Denies other symptoms

## 2019-06-10 ENCOUNTER — Other Ambulatory Visit: Payer: Self-pay

## 2019-06-10 ENCOUNTER — Ambulatory Visit: Payer: Medicare Other | Attending: Nurse Practitioner | Admitting: Nurse Practitioner

## 2019-08-10 MED FILL — PREDNISOLONE AC 1% EYE DROP: 1 | 25 days supply | Qty: 5 | Fill #1

## 2019-08-10 MED FILL — ATROPINE 1% EYE DROPS: 1 | 30 days supply | Qty: 5 | Fill #1

## 2019-08-10 MED FILL — AMLODIPINE BESYLATE 10 MG T: 10 | 90 days supply | Qty: 90 | Fill #1

## 2019-08-10 MED FILL — IBUPROFEN 600 MG TABLET: 600 | 20 days supply | Qty: 60 | Fill #0

## 2019-08-10 MED FILL — LISINOPRIL-HCTZ 20-25 MG TA: 20-25 | 90 days supply | Qty: 90 | Fill #1

## 2019-08-10 MED FILL — LATANOPROST 0.005% EYE DRP: 0.005 | 90 days supply | Qty: 8 | Fill #1

## 2019-08-12 ENCOUNTER — Ambulatory Visit: Payer: Medicare Other | Attending: Nurse Practitioner | Admitting: Physician Assistant

## 2019-08-12 ENCOUNTER — Other Ambulatory Visit: Payer: Self-pay

## 2019-08-12 DIAGNOSIS — I10 Essential (primary) hypertension: Secondary | ICD-10-CM | POA: Diagnosis not present

## 2019-08-12 MED ORDER — LISINOPRIL-HYDROCHLOROTHIAZIDE 20-25 MG PO TABS: 1.0000 | ORAL_TABLET | Freq: Every day | ORAL | 1 refills | Status: DC

## 2019-08-12 MED ORDER — AMLODIPINE BESYLATE 10 MG PO TABS: 10.0000 mg | ORAL_TABLET | Freq: Every day | ORAL | 6 refills | Status: DC

## 2019-08-12 NOTE — Progress Notes (Signed)
Patient ID: Jeffrey Mullen, male   DOB: [birth year], 68 y.o.   MRN: [id number] Virtual Visit via Telephone Note  I connected with Jeffrey Mullen on 08/12/19 at 11:10 AM EDT by telephone and verified that I am speaking with the correct person using two identifiers.   I discussed the limitations, risks, security and privacy concerns of performing an evaluation and management service by telephone and the availability of in person appointments. I also discussed with the patient that there may be a patient responsible charge related to this service. The patient expressed understanding and agreed to proceed.  Patient location:  home My Location:  [place] office Persons on the call:  Me and the patient   History of Present Illness:  Was put of bp meds for about 2 months-just got RF on Monday.  BP has been high bc out of meds.  Denies HA/CP/dizziness/sob    Observations/Objective:  A&Ox3   Assessment and Plan: 1. Essential hypertension Wasn't controlled but not on meds.  Check BP OOO daily and record and bring to next visit. - amLODipine (NORVASC) 10 MG tablet; Take 1 tablet (10 mg total) by mouth daily.  Dispense: 30 tablet; Refill: 6 - lisinopril-hydrochlorothiazide (ZESTORETIC) 20-25 MG tablet; Take 1 tablet by mouth daily.  Dispense: 90 tablet; Refill: 1  Follow Up Instructions: Wants PCP appt asap bc has forms for [facility] that need data filled out on them   I discussed the assessment and treatment plan with the patient. The patient was provided an opportunity to ask questions and all were answered. The patient agreed with the plan and demonstrated an understanding of the instructions.   The patient was advised to call back or seek an in-person evaluation if the symptoms worsen or if the condition fails to improve as anticipated.  I provided 11 minutes of non-face-to-face time during this encounter.   Freeman Caldron, PA-C

## 2019-08-12 NOTE — Progress Notes (Signed)
Patient verified DOB Patient has taken medication and patient will eat after visit Patient complains of right shoulder pain at an 8 with relief from ibuprofen. Patient complains of pain being present for 4-5 months.

## 2019-09-02 ENCOUNTER — Ambulatory Visit: Payer: Medicare Other | Admitting: Family Medicine

## 2019-10-19 MED FILL — ATROPINE 1% EYE DROPS: 1 | 30 days supply | Qty: 5 | Fill #2

## 2019-10-19 MED FILL — PREDNISOLONE AC 1% EYE DROP: 1 | 25 days supply | Qty: 5 | Fill #1

## 2019-10-19 MED FILL — LATANOPROST 0.005% EYE DRP: 0.005 | 30 days supply | Qty: 3 | Fill #1

## 2020-02-10 ENCOUNTER — Telehealth: Payer: Self-pay | Admitting: Nurse Practitioner

## 2020-02-10 NOTE — Telephone Encounter (Signed)
Patient has not been seen since August. Will forward request to PCP.

## 2020-02-10 NOTE — Telephone Encounter (Signed)
1) Medication(s) Requested (by name): -atropine 1 % ophthalmic solution  -latanoprost (XALATAN) 0.005 % ophthalmic solution  -prednisoLONE acetate (PRED FORTE) 1 % ophthalmic suspension  -ibuprofen (ADVIL) 600 MG tablet  -lisinopril-hydrochlorothiazide (ZESTORETIC) 20-25 MG tablet   2) Pharmacy of Choice: -[place], [place] [address]

## 2020-02-12 ENCOUNTER — Other Ambulatory Visit: Payer: Self-pay | Admitting: Nurse Practitioner

## 2020-02-12 ENCOUNTER — Encounter: Payer: Self-pay | Admitting: Nurse Practitioner

## 2020-02-12 DIAGNOSIS — G8929 Other chronic pain: Secondary | ICD-10-CM

## 2020-02-12 DIAGNOSIS — I10 Essential (primary) hypertension: Secondary | ICD-10-CM

## 2020-02-12 MED ORDER — AMLODIPINE BESYLATE 10 MG PO TABS: 10.0000 mg | ORAL_TABLET | Freq: Every day | ORAL | 0 refills | Status: DC

## 2020-02-12 MED ORDER — IBUPROFEN 600 MG PO TABS: 600.0000 mg | ORAL_TABLET | Freq: Three times a day (TID) | ORAL | 0 refills | Status: DC | PRN

## 2020-02-12 MED ORDER — LISINOPRIL-HYDROCHLOROTHIAZIDE 20-25 MG PO TABS: 1.0000 | ORAL_TABLET | Freq: Every day | ORAL | 0 refills | Status: DC

## 2020-02-12 MED FILL — LISINOPRIL-HCTZ 20-25 MG TA: 20-25 | 90 days supply | Qty: 90 | Fill #0

## 2020-02-12 MED FILL — IBUPROFEN 600 MG TABLET: 600 | 20 days supply | Qty: 60 | Fill #0

## 2020-02-12 MED FILL — AMLODIPINE BESYLATE 10 MG T: 10 | 90 days supply | Qty: 90 | Fill #0

## 2020-02-13 ENCOUNTER — Ambulatory Visit: Payer: Medicare Other

## 2020-02-18 ENCOUNTER — Ambulatory Visit: Payer: Medicare Other | Attending: Internal Medicine

## 2020-02-18 DIAGNOSIS — Z23 Encounter for immunization: Secondary | ICD-10-CM

## 2020-02-18 NOTE — Progress Notes (Signed)
   Covid-19 Vaccination Clinic  Name:  INDIE MATKOVICH    MRN: [id number] DOB: [birth year]  02/18/2020  Mr. Treto was observed post Covid-19 immunization for 15 minutes without incidence. He was provided with Vaccine Information Sheet and instruction to access the V-Safe system.   Mr. [place] was instructed to call 911 with any severe reactions post vaccine: [place] Kitchen Difficulty breathing  . Swelling of your face and throat  . A fast heartbeat  . A bad rash all over your body  . Dizziness and weakness    Immunizations Administered    Name Date Dose VIS Date Route   Pfizer COVID-19 Vaccine 02/18/2020 12:10 PM 0.3 mL 12/04/2019 Intramuscular   Manufacturer: [place]   Lot: [id number]   [place]: [id number]

## 2020-02-22 ENCOUNTER — Ambulatory Visit: Payer: Medicare Other | Admitting: Nurse Practitioner

## 2020-03-09 ENCOUNTER — Ambulatory Visit: Payer: Medicare Other

## 2020-03-30 ENCOUNTER — Ambulatory Visit: Payer: Medicare Other

## 2020-04-25 ENCOUNTER — Ambulatory Visit: Payer: Medicare Other | Attending: Internal Medicine

## 2020-04-25 DIAGNOSIS — Z23 Encounter for immunization: Secondary | ICD-10-CM

## 2020-04-25 NOTE — Progress Notes (Signed)
   Covid-19 Vaccination Clinic  Name:  Jeffrey Mullen    MRN: [id number] DOB: November 20, 1951  04/25/2020  [name] was observed post Covid-19 immunization for 15 minutes without incident. He was provided with Vaccine Information Sheet and instruction to access the V-Safe system.   [name] was instructed to call 911 with any severe reactions post vaccine: Marland Kitchen Difficulty breathing  . Swelling of face and throat  . A fast heartbeat  . A bad rash all over body  . Dizziness and weakness   Immunizations Administered    Name Date Dose VIS Date Route   Pfizer COVID-19 Vaccine 04/25/2020  9:21 AM 0.3 mL 02/17/2019 Intramuscular   Manufacturer: [place]   Lot: [id number]   [place]: [id number]

## 2020-05-06 ENCOUNTER — Other Ambulatory Visit: Payer: Self-pay | Admitting: Nurse Practitioner

## 2020-05-06 DIAGNOSIS — I10 Essential (primary) hypertension: Secondary | ICD-10-CM

## 2020-05-18 DIAGNOSIS — H2511 Age-related nuclear cataract, right eye: Secondary | ICD-10-CM | POA: Diagnosis not present

## 2020-05-18 DIAGNOSIS — H25011 Cortical age-related cataract, right eye: Secondary | ICD-10-CM | POA: Diagnosis not present

## 2020-05-18 DIAGNOSIS — Q150 Congenital glaucoma: Secondary | ICD-10-CM | POA: Diagnosis not present

## 2020-05-18 DIAGNOSIS — H524 Presbyopia: Secondary | ICD-10-CM | POA: Diagnosis not present

## 2020-05-19 ENCOUNTER — Other Ambulatory Visit: Payer: Self-pay | Admitting: Nurse Practitioner

## 2020-05-19 DIAGNOSIS — I10 Essential (primary) hypertension: Secondary | ICD-10-CM

## 2020-05-19 DIAGNOSIS — M25511 Pain in right shoulder: Secondary | ICD-10-CM

## 2020-05-20 ENCOUNTER — Other Ambulatory Visit: Payer: Self-pay

## 2020-05-20 DIAGNOSIS — M25511 Pain in right shoulder: Secondary | ICD-10-CM

## 2020-05-20 NOTE — Telephone Encounter (Signed)
Pt. Request refill for Ibuprofen 500mg  to [facility].

## 2020-05-30 NOTE — Telephone Encounter (Signed)
Spoke to patient he needs to schedule a lab appt. Pt. Stated he will give [place] a call back.

## 2020-06-20 ENCOUNTER — Ambulatory Visit: Payer: Medicare Other | Attending: Nurse Practitioner | Admitting: Nurse Practitioner

## 2020-06-20 ENCOUNTER — Other Ambulatory Visit: Payer: Self-pay

## 2020-06-20 ENCOUNTER — Encounter: Payer: Self-pay | Admitting: Nurse Practitioner

## 2020-06-20 VITALS — BP 126/68 | HR 88 | Temp 97.7°F | Wt 177.0 lb

## 2020-06-20 DIAGNOSIS — Z1211 Encounter for screening for malignant neoplasm of colon: Secondary | ICD-10-CM

## 2020-06-20 DIAGNOSIS — I739 Peripheral vascular disease, unspecified: Secondary | ICD-10-CM

## 2020-06-20 DIAGNOSIS — E785 Hyperlipidemia, unspecified: Secondary | ICD-10-CM | POA: Diagnosis not present

## 2020-06-20 DIAGNOSIS — I10 Essential (primary) hypertension: Secondary | ICD-10-CM

## 2020-06-20 DIAGNOSIS — M25511 Pain in right shoulder: Secondary | ICD-10-CM

## 2020-06-20 DIAGNOSIS — N521 Erectile dysfunction due to diseases classified elsewhere: Secondary | ICD-10-CM

## 2020-06-20 DIAGNOSIS — K7460 Unspecified cirrhosis of liver: Secondary | ICD-10-CM

## 2020-06-20 DIAGNOSIS — L80 Vitiligo: Secondary | ICD-10-CM

## 2020-06-20 DIAGNOSIS — F17200 Nicotine dependence, unspecified, uncomplicated: Secondary | ICD-10-CM

## 2020-06-20 DIAGNOSIS — G8929 Other chronic pain: Secondary | ICD-10-CM

## 2020-06-20 DIAGNOSIS — L853 Xerosis cutis: Secondary | ICD-10-CM

## 2020-06-20 MED ORDER — LISINOPRIL-HYDROCHLOROTHIAZIDE 20-25 MG PO TABS: 1.0000 | ORAL_TABLET | Freq: Every day | ORAL | 1 refills | Status: DC

## 2020-06-20 MED ORDER — AMLODIPINE BESYLATE 10 MG PO TABS: 10.0000 mg | ORAL_TABLET | Freq: Every day | ORAL | 1 refills | Status: DC

## 2020-06-20 MED ORDER — TRIAMCINOLONE ACETONIDE 0.025 % EX OINT: 1.0000 "application " | TOPICAL_OINTMENT | Freq: Two times a day (BID) | CUTANEOUS | 1 refills | Status: DC

## 2020-06-20 MED ORDER — IBUPROFEN 600 MG PO TABS: 600.0000 mg | ORAL_TABLET | Freq: Three times a day (TID) | ORAL | 1 refills | Status: DC | PRN

## 2020-06-20 MED ORDER — SILDENAFIL CITRATE 100 MG PO TABS: 50.0000 mg | ORAL_TABLET | Freq: Every day | ORAL | 99 refills | Status: DC | PRN

## 2020-06-20 MED ORDER — SAYMAN LANOLIN EX BAR: CHEWABLE_BAR | CUTANEOUS | 99 refills | Status: DC

## 2020-06-20 NOTE — Patient Instructions (Signed)
HTN 140/90 of higher

## 2020-06-20 NOTE — Progress Notes (Signed)
Assessment & Plan:  Jeffrey Mullen was seen today for medication refill.  Diagnoses and all orders for this visit:  Essential hypertension -     CMP14+EGFR -     lisinopril-hydrochlorothiazide (ZESTORETIC) 20-25 MG tablet; Take 1 tablet by mouth daily. -     amLODipine (NORVASC) 10 MG tablet; Take 1 tablet (10 mg total) by mouth daily. Continue all antihypertensives as prescribed.  Remember to bring in your blood pressure log with you for your follow up appointment.  DASH/Mediterranean Diets are healthier choices for HTN.   Cirrhosis of liver without ascites, unspecified hepatic cirrhosis type (HCC) -     CBC -     CMP14+EGFR  Dyslipidemia, goal LDL below 100 -     Lipid panel INSTRUCTIONS: Work on a low fat, heart healthy diet and participate in regular aerobic exercise program by working out at least 150 minutes per week; 5 days a week-30 minutes per day. Avoid red meat/beef/steak,  fried foods. junk foods, sodas, sugary drinks, unhealthy snacking, alcohol and smoking.  Drink at least 80 oz of water per day and monitor your carbohydrate intake daily.    Erectile dysfunction due to diseases classified elsewhere -     sildenafil (VIAGRA) 100 MG tablet; Take 0.5-1 tablets (50-100 mg total) by mouth daily as needed for erectile dysfunction.  Claudication [facility]) -     Ambulatory referral to Vascular Surgery  Chronic right shoulder pain -     ibuprofen (ADVIL) 600 MG tablet; Take 1 tablet (600 mg total) by mouth every 8 (eight) hours as needed for moderate pain.  May alternate with heat and ice application for pain relief. May also alternate with acetaminophen as prescribed pain relief. Other alternatives include massage, acupuncture and water aerobics.  You must stay active and avoid a sedentary lifestyle.  Primary vitiligo -     triamcinolone (KENALOG) 0.025 % ointment; Apply 1 application topically 2 (two) times daily. -     Ambulatory referral to Dermatology  Dry skin dermatitis -      Soap & Cleansers (SAYMAN LANOLIN) BAR; Apply to affected area  Colon cancer screening -     Ambulatory referral to Gastroenterology  Tobacco dependence -     CT CHEST LUNG CA SCREEN LOW DOSE W/O CM; Future    Patient has been counseled on age-appropriate routine health concerns for screening and prevention. These are reviewed and up-to-date. Referrals have been placed accordingly. Immunizations are up-to-date or declined.    Subjective:   Chief Complaint  Patient presents with  . Medication Refill    Pt. is here for requesting Ibuprofen refill.    HPI Jeffrey Mullen 69 y.o. male presents to office today for follow up. Patient has been counseled on age-appropriate routine health concerns for screening and prevention. These are reviewed and up-to-date. Referrals have been placed accordingly. Immunizations are up-to-date or declined.       has a past medical history of Alcohol abuse, Blind left eye, Cataract, right eye, Congenital blindness, Glaucoma, bilateral, Hepatic cirrhosis ([place]), Hepatitis, and HTN (hypertension).   Tobacco Dependence Smokes 2 cigarettes per day. Started smoking 40 years ago.   Requesting Colonoscopy and has complaints of persistant leg pain. Abnormal ABI last year. However he did not follow up with GI or vascular after referrals were made.   Essential Hypertension Well controlled. Will refill amlodipine 10 mg and zestoretic 20-25 mg daily. Denies chest pain, shortness of breath, palpitations, lightheadedness, dizziness, headaches or BLE edema.  BP Readings  from Last 3 Encounters:  06/20/20 126/68  05/06/19 134/79  05/04/19 120/70    Chronic right shoulder pain Xray in 2019 showing mild OA of the AC joint. NSAIDs and muscle relaxants seem to provide relief of his pain. Unrelated to injury or trauma.    Review of Systems  Constitutional: Negative for fever, malaise/fatigue and weight loss.  HENT: Negative.  Negative for nosebleeds.   Eyes: Positive  for blurred vision (right eye). Negative for double vision and photophobia.       Blind left eye  Respiratory: Negative.  Negative for cough and shortness of breath.   Cardiovascular: Positive for claudication. Negative for chest pain, palpitations and leg swelling.  Gastrointestinal: Negative.  Negative for heartburn, nausea and vomiting.  Genitourinary:       ED  Musculoskeletal: Positive for joint pain. Negative for myalgias.  Skin: Positive for itching and rash.  Neurological: Negative.  Negative for dizziness, focal weakness, seizures and headaches.  Psychiatric/Behavioral: Positive for substance abuse (etoh abuse). Negative for suicidal ideas.    Past Medical History:  Diagnosis Date  . Alcohol abuse   . Blind left eye   . Cataract, right eye   . Congenital blindness    LEFT EYE  . Glaucoma, bilateral   . Hepatic cirrhosis ([place])   . Hepatitis    HEP C treated in 2016  . HTN (hypertension)     History reviewed. No pertinent surgical history.  Family History  Problem Relation Age of Onset  . Hearing loss Other   . Kidney disease Neg Hx     Social History Reviewed with no changes to be made today.   Outpatient Medications Prior to Visit  Medication Sig Dispense Refill  . atropine 1 % ophthalmic solution Place 1 drop into the left eye 2 (two) times daily. 15 mL 2  . latanoprost (XALATAN) 0.005 % ophthalmic solution Place 1 drop into the right eye at bedtime. 7.5 mL 1  . prednisoLONE acetate (PRED FORTE) 1 % ophthalmic suspension Place 1 drop into the left eye 3 (three) times daily. 5 mL 1  . ibuprofen (ADVIL) 600 MG tablet Take 1 tablet (600 mg total) by mouth every 8 (eight) hours as needed for moderate pain. 60 tablet 0  . mupirocin ointment (BACTROBAN) 2 % Place 1 application into the nose 2 (two) times daily. 30 g 1  . sildenafil (VIAGRA) 100 MG tablet Take 0.5-1 tablets (50-100 mg total) by mouth daily as needed for up to 30 days for erectile dysfunction. 10 tablet  prn  . amLODipine (NORVASC) 10 MG tablet Take 1 tablet (10 mg total) by mouth daily. 90 tablet 0  . lisinopril-hydrochlorothiazide (ZESTORETIC) 20-25 MG tablet Take 1 tablet by mouth daily. 90 tablet 0  . methocarbamol (ROBAXIN) 500 MG tablet Take 1 tablet (500 mg total) by mouth every 6 (six) hours as needed for muscle spasms. (Patient not taking: Reported on 08/12/2019) 90 tablet 4   No facility-administered medications prior to visit.    Allergies  Allergen Reactions  . Acetaminophen Other (See Comments)    Causes eye pressure to elevate; CANNOT HAVE THIS       Objective:    BP 126/68 (BP Location: Left Arm, Patient Position: Sitting, Cuff Size: Normal)   Pulse 88   Temp 97.7 F (36.5 C) (Temporal)   Wt 177 lb (80.3 kg)   SpO2 98%   BMI 24.69 kg/m  Wt Readings from Last 3 Encounters:  06/20/20 177 lb (80.3 kg)  05/06/19 180 lb (81.6 kg)  05/03/19 178 lb (80.7 kg)    Physical Exam Vitals and nursing note reviewed.  Constitutional:      Appearance: He is well-developed.  HENT:     Head: Normocephalic and atraumatic.   Cardiovascular:     Rate and Rhythm: Normal rate and regular rhythm.     Heart sounds: Normal heart sounds. No murmur heard.  No friction rub. No gallop.   Pulmonary:     Effort: Pulmonary effort is normal. No tachypnea or respiratory distress.     Breath sounds: Normal breath sounds. No decreased breath sounds, wheezing, rhonchi or rales.  Chest:     Chest wall: No tenderness.  Abdominal:     General: Bowel sounds are normal.     Palpations: Abdomen is soft.  Musculoskeletal:        General: Normal range of motion.     Cervical back: Normal range of motion.  Skin:    General: Skin is warm and dry.  Neurological:     Mental Status: He is alert and oriented to person, place, and time.     Coordination: Coordination normal.  Psychiatric:        Behavior: Behavior normal. Behavior is cooperative.        Thought Content: Thought content normal.         Judgment: Judgment normal.          Patient has been counseled extensively about nutrition and exercise as well as the importance of adherence with medications and regular follow-up. The patient was given clear instructions to go to ER or return to medical center if symptoms don't improve, worsen or new problems develop. The patient verbalized understanding.   Follow-up: Return in about 3 months (around 09/20/2020).   [name], FNP-BC [facility] and [place], [place]   06/23/2020, 5:11 PM

## 2020-06-21 ENCOUNTER — Ambulatory Visit: Payer: Medicare Other | Attending: Nurse Practitioner | Admitting: Pharmacist

## 2020-06-21 DIAGNOSIS — Z23 Encounter for immunization: Secondary | ICD-10-CM

## 2020-06-21 LAB — CMP14+EGFR
ALT: 60 IU/L — ABNORMAL HIGH (ref 0–44)
AST: 35 IU/L (ref 0–40)
Albumin/Globulin Ratio: 1.5 (ref 1.2–2.2)
Albumin: 4.9 g/dL — ABNORMAL HIGH (ref 3.8–4.8)
Alkaline Phosphatase: 104 IU/L (ref 48–121)
BUN/Creatinine Ratio: 9 — ABNORMAL LOW (ref 10–24)
BUN: 9 mg/dL (ref 8–27)
Bilirubin Total: <0.2 mg/dL (ref 0.0–1.2)
CO2: 24 mmol/L (ref 20–29)
Calcium: 10.4 mg/dL — ABNORMAL HIGH (ref 8.6–10.2)
Chloride: 103 mmol/L (ref 96–106)
Creatinine, Ser: 1.03 mg/dL (ref 0.76–1.27)
GFR calc Af Amer: 87 mL/min/{1.73_m2} (ref >59)
GFR calc non Af Amer: 75 mL/min/{1.73_m2} (ref >59)
Globulin, Total: 3.2 g/dL (ref 1.5–4.5)
Glucose: 91 mg/dL (ref 65–99)
Potassium: 4.2 mmol/L (ref 3.5–5.2)
Sodium: 144 mmol/L (ref 134–144)
Total Protein: 8.1 g/dL (ref 6.0–8.5)

## 2020-06-21 LAB — CBC
Hematocrit: 40.9 % (ref 37.5–51.0)
Hemoglobin: 13.8 g/dL (ref 13.0–17.7)
MCH: 29.2 pg (ref 26.6–33.0)
MCHC: 33.7 g/dL (ref 31.5–35.7)
MCV: 87 fL (ref 79–97)
Platelets: 234 10*3/uL (ref 150–450)
RBC: 4.72 x10E6/uL (ref 4.14–5.80)
RDW: 14.1 % (ref 11.6–15.4)
WBC: 4.6 10*3/uL (ref 3.4–10.8)

## 2020-06-21 LAB — LIPID PANEL
Chol/HDL Ratio: 3.1 ratio (ref 0.0–5.0)
Cholesterol, Total: 171 mg/dL (ref 100–199)
HDL: 56 mg/dL (ref >39)
LDL Chol Calc (NIH): 97 mg/dL (ref 0–99)
Triglycerides: 102 mg/dL (ref 0–149)
VLDL Cholesterol Cal: 18 mg/dL (ref 5–40)

## 2020-06-21 NOTE — Progress Notes (Signed)
Patient presents for vaccination against zoster per orders of Zelda. Consent given. Counseling provided. No contraindications exists. Vaccine administered without incident.  ° °

## 2020-06-22 ENCOUNTER — Encounter: Payer: Self-pay | Admitting: Gastroenterology

## 2020-06-23 ENCOUNTER — Encounter: Payer: Self-pay | Admitting: Nurse Practitioner

## 2020-07-06 ENCOUNTER — Telehealth: Payer: Self-pay | Admitting: Dermatology

## 2020-07-06 NOTE — Telephone Encounter (Signed)
Patient left message on office voice mail wanting to schedule a referral appointment from Dr. [name].  Patient is called back and an appointment is scheduled for 12/26/2020 at 9:30 with Lavonna Monarch, MD.

## 2020-07-20 DIAGNOSIS — Q150 Congenital glaucoma: Secondary | ICD-10-CM | POA: Diagnosis not present

## 2020-08-02 ENCOUNTER — Other Ambulatory Visit: Payer: Self-pay | Admitting: Nurse Practitioner

## 2020-08-02 DIAGNOSIS — L80 Vitiligo: Secondary | ICD-10-CM

## 2020-08-02 DIAGNOSIS — I10 Essential (primary) hypertension: Secondary | ICD-10-CM

## 2020-08-02 DIAGNOSIS — G8929 Other chronic pain: Secondary | ICD-10-CM

## 2020-08-11 ENCOUNTER — Other Ambulatory Visit: Payer: Self-pay

## 2020-08-11 ENCOUNTER — Ambulatory Visit: Payer: Medicare Other

## 2020-08-11 VITALS — Ht 71.0 in | Wt 176.0 lb

## 2020-08-11 DIAGNOSIS — Z1211 Encounter for screening for malignant neoplasm of colon: Secondary | ICD-10-CM

## 2020-08-11 MED ORDER — NA SULFATE-K SULFATE-MG SULF 17.5-3.13-1.6 GM/177ML PO SOLN: 1.0000 | Freq: Once | ORAL | 0 refills | Status: AC

## 2020-08-11 NOTE — Progress Notes (Signed)
Denies allergies to eggs or soy products. Denies complication of anesthesia or sedation. Denies use of weight loss medication. Denies use of O2.   Emmi instructions given for colonoscopy.  Covid Vaccination was completed on 04/25/20.

## 2020-08-12 ENCOUNTER — Encounter: Payer: Self-pay | Admitting: Nurse Practitioner

## 2020-08-14 NOTE — Telephone Encounter (Signed)
Opal Sidles can you tell me what code I would use for insurance to pay for this? I have never heard of this before. Thanks!!

## 2020-08-15 ENCOUNTER — Telehealth: Payer: Self-pay

## 2020-08-15 NOTE — Telephone Encounter (Signed)
Call placed to patient # [phone],  to inquire about his request for a home health nurse to accompany him to his colonoscopy. Message left with call back requested to this CM.   Call placed to [facility] Provider services # [phone], spoke to [place] who stated that patient's request for the home health nurse would need to be addressed with member services. # [phone].   Call placed to member services at number provided above, spoke \\to  Delin. The call kept fading in and out and was eventually ended

## 2020-08-15 NOTE — Telephone Encounter (Signed)
Thank you :)

## 2020-08-15 NOTE — Telephone Encounter (Signed)
[facility] called back with correct provider line  [phone]

## 2020-08-15 NOTE — Telephone Encounter (Signed)
Pt returned call, no answer from office. Made aware of information on chart, wants to resolve this as soon as possible.

## 2020-08-18 ENCOUNTER — Other Ambulatory Visit: Payer: Self-pay | Admitting: Nurse Practitioner

## 2020-08-18 NOTE — Telephone Encounter (Signed)
I have spoken with Jeffrey Mullen and he is aware of what we have been told by his insurance provider. He will attempt to contact the representative whom he spoke with and send the contact information through [employer]

## 2020-08-18 NOTE — Telephone Encounter (Signed)
Requested medication (s) are due for refill today: no  Requested medication (s) are on the active medication list:yes  Future visit scheduled: yes  Notes to clinic:   Medication not assigned to a protocol, review manually  Requested Prescriptions  Pending Prescriptions Disp Refills   prednisoLONE acetate (PRED FORTE) 1 % ophthalmic suspension 5 mL 1    Sig: Place 1 drop into the left eye 3 (three) times daily.      Off-Protocol Failed - 08/18/2020  9:45 AM      Failed - Medication not assigned to a protocol, review manually.      Passed - Valid encounter within last 12 months    Recent Outpatient Visits           1 month ago Need for zoster vaccination   [place], RPH-CPP   1 month ago Essential hypertension   [place], Vernia Buff, NP   1 year ago Essential hypertension   [place], Dionne Bucy, [place]   1 year ago Essential hypertension   [place], Vernia Buff, NP   1 year ago Encounter for annual physical exam   [place] [name], NP       Future Appointments             In 4 days Daisy [place]oss[place], Jarome Matin, [place]   In 1 month [name], NP [place]   In 4 months Lavonna Monarch, MD [facility], [facility]

## 2020-08-18 NOTE — Telephone Encounter (Signed)
Copied from [place] [id number]. Topic: Quick Communication - Rx Refill/Question >> Aug 18, 2020  9:40 AM Yvette Rack wrote: Medication: prednisoLONE acetate (PRED FORTE) 1 % ophthalmic suspension  Has the patient contacted their pharmacy? yes   Preferred Pharmacy (with phone number or street name): [facility], [place] Phone: [phone]   Fax: [phone]  Agent: Please be advised that RX refills may take up to 3 business days. We ask that you follow-up with your pharmacy.

## 2020-08-19 ENCOUNTER — Telehealth: Payer: Self-pay | Admitting: *Deleted

## 2020-08-19 MED ORDER — PREDNISOLONE ACETATE 1 % OP SUSP: 1.0000 [drp] | Freq: Three times a day (TID) | OPHTHALMIC | 1 refills | Status: DC

## 2020-08-19 NOTE — Telephone Encounter (Signed)
Patient called requesting predisone eye drops to be called in. Please contact patient

## 2020-08-22 ENCOUNTER — Ambulatory Visit: Payer: Medicare Other | Attending: Family Medicine | Admitting: Pharmacist

## 2020-08-22 ENCOUNTER — Telehealth: Payer: Self-pay | Admitting: Nurse Practitioner

## 2020-08-22 ENCOUNTER — Other Ambulatory Visit: Payer: Self-pay

## 2020-08-22 DIAGNOSIS — Z23 Encounter for immunization: Secondary | ICD-10-CM

## 2020-08-22 NOTE — Telephone Encounter (Signed)
Patient wanted to let pcp know that he was able to find someone to go with him for the colonoscopy and cataract surgery, so both of those appointments will go on as planned.

## 2020-08-22 NOTE — Telephone Encounter (Signed)
Spoke to patient. CMA called [employer] and patient have one more refill. CMA informed patient to contact his pharmacy. Patient understood.

## 2020-08-22 NOTE — Telephone Encounter (Signed)
Will route to PCP 

## 2020-08-22 NOTE — Progress Notes (Signed)
Patient presents for vaccination against zoster per orders of [name]. Consent given. Counseling provided. No contraindications exists. Vaccine administered without incident.   Benard Halsted, PharmD, [place] [phone]

## 2020-08-22 NOTE — Telephone Encounter (Signed)
No additional refills until he has followed up with his eye doctor. Thanks

## 2020-08-23 NOTE — Telephone Encounter (Signed)
PCP is aware  

## 2020-08-25 ENCOUNTER — Other Ambulatory Visit: Payer: Self-pay

## 2020-08-25 ENCOUNTER — Ambulatory Visit: Payer: Medicare Other | Admitting: Gastroenterology

## 2020-08-25 ENCOUNTER — Encounter: Payer: Self-pay | Admitting: Gastroenterology

## 2020-08-25 VITALS — BP 161/83 | HR 84 | Temp 97.7°F | Ht 71.0 in | Wt 176.0 lb

## 2020-08-25 MED ORDER — FLEET ENEMA 7-19 GM/118ML RE ENEM
1.0000 | ENEMA | Freq: Once | RECTAL | Status: AC
  Administered 2020-08-25: 1 via RECTAL

## 2020-08-25 MED ORDER — SODIUM CHLORIDE 0.9 % IV SOLN: 500.0000 mL | Freq: Once | INTRAVENOUS | Status: DC

## 2020-08-25 NOTE — Progress Notes (Signed)
Patient reported that BM was cloudy/brown.  MD aware and fleet enema ordered.  BM still cloudy/brown after enema.  Patient is being rescheduled for procedure on 08/26/20.  New prep instructions given by Randall Hiss, RN.

## 2020-08-25 NOTE — Progress Notes (Signed)
Vital signs checked by:CW  The patient states no changes in medical or surgical history since pre-visit screening on 08/11/20.

## 2020-08-26 ENCOUNTER — Ambulatory Visit (AMBULATORY_SURGERY_CENTER): Payer: Medicare Other | Admitting: Gastroenterology

## 2020-08-26 ENCOUNTER — Encounter: Payer: Self-pay | Admitting: Gastroenterology

## 2020-08-26 VITALS — BP 155/77 | HR 75 | Temp 97.7°F | Resp 15 | Ht 71.0 in | Wt 176.0 lb

## 2020-08-26 DIAGNOSIS — Z1211 Encounter for screening for malignant neoplasm of colon: Secondary | ICD-10-CM

## 2020-08-26 DIAGNOSIS — D127 Benign neoplasm of rectosigmoid junction: Secondary | ICD-10-CM | POA: Diagnosis not present

## 2020-08-26 DIAGNOSIS — D125 Benign neoplasm of sigmoid colon: Secondary | ICD-10-CM | POA: Diagnosis not present

## 2020-08-26 DIAGNOSIS — D128 Benign neoplasm of rectum: Secondary | ICD-10-CM | POA: Diagnosis not present

## 2020-08-26 MED ORDER — SODIUM CHLORIDE 0.9 % IV SOLN: 500.0000 mL | INTRAVENOUS | Status: DC

## 2020-08-26 NOTE — Progress Notes (Signed)
To PACU, VSS. Report to [email or web address] 

## 2020-08-26 NOTE — Op Note (Addendum)
[facility] Patient Name: Jeffrey Mullen Procedure Date: 08/26/2020 1:58 PM MRN: [id number] Endoscopist: Jackquline Denmark , MD Age: 69 Referring MD:  Date of Birth: Aug 22, 1951 Gender: Male Account #: [id number] Procedure:                Colonoscopy Indications:              Screening for colorectal malignant neoplasm. Pt                            came yesterday but had poor prep (Dr Loletha Carrow).                            Reprepped for colon today. Medicines:                Monitored Anesthesia Care Procedure:                Pre-Anesthesia Assessment:                           - Prior to the procedure, a History and Physical                            was performed, and patient medications and                            allergies were reviewed. The patient's tolerance of                            previous anesthesia was also reviewed. The risks                            and benefits of the procedure and the sedation                            options and risks were discussed with the patient.                            All questions were answered, and informed consent                            was obtained. Prior Anticoagulants: The patient has                            taken no previous anticoagulant or antiplatelet                            agents. ASA Grade Assessment: II - A patient with                            mild systemic disease. After reviewing the risks                            and benefits, the patient was deemed in  satisfactory condition to undergo the procedure.                           After obtaining informed consent, the colonoscope                            was passed under direct vision. Throughout the                            procedure, the patient's blood pressure, pulse, and                            oxygen saturations were monitored continuously. The                            Colonoscope was introduced through the  anus and                            advanced to the the cecum, identified by                            appendiceal orifice and ileocecal valve.                            Thereafter, 2 cm of TI was intubated. The                            colonoscopy was performed without difficulty. The                            patient tolerated the procedure well. The quality                            of the bowel preparation was good. The ileocecal                            valve, appendiceal orifice, TI, and rectum were                            photographed. Scope In: 2:32:12 PM Scope Out: 2:47:29 PM Scope Withdrawal Time: 0 hours 10 minutes 53 seconds  Total Procedure Duration: 0 hours 15 minutes 17 seconds  Findings:                 Two semi-pedunculated polyps were found in the                            rectum and distal sigmoid colon. The polyps were 8                            mm in size. These polyps were removed with a hot                            snare. Resection and retrieval were complete.  A few small-mouthed diverticula were found in the                            sigmoid colon and asc colon.                           Non-bleeding internal hemorrhoids were found during                            retroflexion. The hemorrhoids were small.                           The exam was otherwise without abnormality on                            direct and retroflexion views. TI was normal. Complications:            No immediate complications. Estimated Blood Loss:     Estimated blood loss: none. Impression:               - Two 8 mm polyps in the rectum and in the distal                            sigmoid colon, removed with a hot snare. Resected                            and retrieved.                           - Mild colonic diverticulosis.                           - Non-bleeding internal hemorrhoids.                           - The examination was otherwise  normal on direct                            and retroflexion views. Recommendation:           - Patient has a contact number available for                            emergencies. The signs and symptoms of potential                            delayed complications were discussed with the                            patient. Return to normal activities tomorrow.                            Written discharge instructions were provided to the                            patient.                           -  Resume previous diet.                           - Continue present medications.                           - Await pathology results.                           - Repeat colonoscopy for surveillance based on                            pathology results.                           - The findings and recommendations were discussed                            with the patient's family.                           - FU with Dr Loletha Carrow if needed. Jackquline Denmark, MD 08/26/2020 2:52:17 PM This report has been signed electronically.

## 2020-08-26 NOTE — Progress Notes (Signed)
Called to room to assist during endoscopic procedure.  Patient ID and intended procedure confirmed with present staff. Received instructions for my participation in the procedure from the performing physician.  

## 2020-08-26 NOTE — Patient Instructions (Signed)
Handouts given;  Polyps, Diverticulosis, Hemorrhoids Resume previous diet Continue present medications Await pathology results  YOU HAD AN ENDOSCOPIC PROCEDURE TODAY AT [facility]:   Refer to the procedure report that was given to you for any specific questions about what was found during the examination.  If the procedure report does not answer your questions, please call your gastroenterologist to clarify.  If you requested that your care partner not be given the details of your procedure findings, then the procedure report has been included in a sealed envelope for you to review at your convenience later.  YOU SHOULD EXPECT: Some feelings of bloating in the abdomen. Passage of more gas than usual.  Walking can help get rid of the air that was put into your GI tract during the procedure and reduce the bloating. If you had a lower endoscopy (such as a colonoscopy or flexible sigmoidoscopy) you may notice spotting of blood in your stool or on the toilet paper. If you underwent a bowel prep for your procedure, you may not have a normal bowel movement for a few days.  Please Note:  You might notice some irritation and congestion in your nose or some drainage.  This is from the oxygen used during your procedure.  There is no need for concern and it should clear up in a day or so.  SYMPTOMS TO REPORT IMMEDIATELY:   Following lower endoscopy (colonoscopy or flexible sigmoidoscopy):  Excessive amounts of blood in the stool  Significant tenderness or worsening of abdominal pains  Swelling of the abdomen that is new, acute  Fever of 100F or higher   For urgent or emergent issues, a gastroenterologist can be reached at any hour by calling [phone]. Do not use MyChart messaging for urgent concerns.    DIET:  We do recommend a small meal at first, but then you may proceed to your regular diet.  Drink plenty of fluids but you should avoid alcoholic beverages for 24  hours.  ACTIVITY:  You should plan to take it easy for the rest of today and you should NOT DRIVE or use heavy machinery until tomorrow (because of the sedation medicines used during the test).    FOLLOW UP: Our staff will call the number listed on your records 48-72 hours following your procedure to check on you and address any questions or concerns that you may have regarding the information given to you following your procedure. If we do not reach you, we will leave a message.  We will attempt to reach you two times.  During this call, we will ask if you have developed any symptoms of COVID 19. If you develop any symptoms (ie: fever, flu-like symptoms, shortness of breath, cough etc.) before then, please call [phone].  If you test positive for Covid 19 in the 2 weeks post procedure, please call and report this information to [place].    If any biopsies were taken you will be contacted by phone or by letter within the next 1-3 weeks.  Please call us at [phone] if you have not heard about the biopsies in 3 weeks.    SIGNATURES/CONFIDENTIALITY: You and/or your care partner have signed paperwork which will be entered into your electronic medical record.  These signatures attest to the fact that that the information above on your After Visit Summary has been reviewed and is understood.  Full responsibility of the confidentiality of this discharge information lies with you and/or your care-partner.

## 2020-08-31 ENCOUNTER — Telehealth: Payer: Self-pay | Admitting: *Deleted

## 2020-08-31 ENCOUNTER — Telehealth: Payer: Self-pay

## 2020-08-31 NOTE — Telephone Encounter (Signed)
Attempted to call patient back but unable to leave message mailbox was full.

## 2020-08-31 NOTE — Telephone Encounter (Signed)
No answer, unable to leave a message, B.Ephram Kornegay RN. 

## 2020-09-02 ENCOUNTER — Encounter: Payer: Self-pay | Admitting: Gastroenterology

## 2020-09-07 ENCOUNTER — Other Ambulatory Visit: Payer: Self-pay | Admitting: Nurse Practitioner

## 2020-09-07 DIAGNOSIS — L80 Vitiligo: Secondary | ICD-10-CM

## 2020-09-07 DIAGNOSIS — G8929 Other chronic pain: Secondary | ICD-10-CM

## 2020-09-07 DIAGNOSIS — I10 Essential (primary) hypertension: Secondary | ICD-10-CM

## 2020-09-14 ENCOUNTER — Other Ambulatory Visit: Payer: Self-pay

## 2020-09-14 DIAGNOSIS — I739 Peripheral vascular disease, unspecified: Secondary | ICD-10-CM

## 2020-09-20 ENCOUNTER — Ambulatory Visit: Payer: Medicare Other | Attending: Nurse Practitioner | Admitting: Nurse Practitioner

## 2020-09-20 ENCOUNTER — Encounter: Payer: Self-pay | Admitting: Nurse Practitioner

## 2020-09-20 ENCOUNTER — Other Ambulatory Visit: Payer: Self-pay

## 2020-09-20 DIAGNOSIS — I10 Essential (primary) hypertension: Secondary | ICD-10-CM | POA: Diagnosis not present

## 2020-09-20 DIAGNOSIS — N529 Male erectile dysfunction, unspecified: Secondary | ICD-10-CM | POA: Diagnosis not present

## 2020-09-20 NOTE — Progress Notes (Signed)
Virtual Visit via Telephone Note Due to national recommendations of social distancing due to [place] 19, telehealth visit is felt to be most appropriate for this patient at this time.  I discussed the limitations, risks, security and privacy concerns of performing an evaluation and management service by telephone and the availability of in person appointments. I also discussed with the patient that there may be a patient responsible charge related to this service. The patient expressed understanding and agreed to proceed.    I connected with Jeffrey Mullen on 09/20/20  at   2:10 PM EDT  EDT by telephone and verified that I am speaking with the correct person using two identifiers.   Consent I discussed the limitations, risks, security and privacy concerns of performing an evaluation and management service by telephone and the availability of in person appointments. I also discussed with the patient that there may be a patient responsible charge related to this service. The patient expressed understanding and agreed to proceed.   Location of Patient: Private Residence    Location of Provider: [place] and [place] participating in Telemedicine visit: [name] FNP-BC [place]    History of Present Illness: Telemedicine visit for: Follow Up  Erectile Dysfunction Currently taking viagra and still unable to have a complete erection. Would like a referral to urology as well as testosterone level checked. He does have a history of ETOH abuse which could be contributing to his erectile dysfunction.  Lab Results  Component Value Date   TSH 0.639 08/25/2018      Essential Hypertension Taking amlodipine 10 mg daily and lisinopril-hctz 20-25 mg daily as prescribed. Denies chest pain, shortness of breath, palpitations, lightheadedness, dizziness, headaches or BLE edema.  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[place])    Hepatitis    HEP C treated in 2016   HTN (hypertension)    Seizures ([place])    3 years ago    Past Surgical History:  Procedure Laterality Date   CATARACT EXTRACTION Left    GLAUCOMA SURGERY     Patient reports 31 or eleven surgeries for Glaucoma    Family History  Problem Relation Age of Onset   Hearing loss Other    Kidney disease Neg Hx    Colon cancer Neg Hx    Esophageal cancer Neg Hx    Rectal cancer Neg Hx    Stomach cancer Neg Hx     Social History   Socioeconomic History   Marital status: Single    Spouse name: Not on file   Number of children: Not on file   Years of education: Not on file   Highest education level: Not on file  Occupational History   Not on file  Tobacco Use   Smoking status: Current Some Day Smoker    Types: Cigarettes   Smokeless tobacco: Never Used  Substance and Sexual Activity   Alcohol use: Yes    Alcohol/week: 0.0 standard drinks    Comment: twice a  month   Drug use: No   Sexual activity: Not Currently  Other Topics Concern   Not on file  Social History Narrative   Not on file   Social Determinants of Health   Financial Resource Strain:    Difficulty of Paying Living Expenses: Not on file  Food Insecurity:    Worried About [place] in the Last Year: Not on file   Ran Out of Food in the Last Year: Not on file  Transportation Needs:    Lack of Transportation (Medical): Not on file   Lack of Transportation (Non-Medical): Not on file  Physical Activity:    Days of Exercise per Week: Not on file   Minutes of Exercise per Session: Not on file  Stress:    Feeling of Stress : Not  on file  Social Connections:    Frequency of Communication with Friends and Family: Not on file   Frequency of Social Gatherings with Friends and Family: Not on file   Attends Religious Services: Not on file   Active Member of Clubs or Organizations: Not on file   Attends Archivist Meetings: Not on file   Marital Status: Not on file     Observations/Objective: Awake, alert and oriented x 3   Review of Systems  Constitutional: Negative for fever, malaise/fatigue and weight loss.  HENT: Negative.  Negative for nosebleeds.   Eyes: Negative.  Negative for blurred vision, double vision and photophobia.  Respiratory: Negative.  Negative for cough and shortness of breath.   Cardiovascular: Negative.  Negative for chest pain, palpitations and leg swelling.  Gastrointestinal: Negative.  Negative for heartburn, nausea and vomiting.  Genitourinary:       SEE HPI  Musculoskeletal: Negative.  Negative for myalgias.  Neurological: Negative.  Negative for dizziness, focal weakness, seizures and headaches.  Psychiatric/Behavioral: Negative.  Negative for suicidal ideas.    Assessment and Plan: Jeffrey Mullen was seen today for follow-up.  Diagnoses and all orders for this visit:  Essential hypertension -     CMP14+EGFR; Future Continue all antihypertensives as prescribed.  Remember to bring in your blood pressure log with you for your follow up appointment.  DASH/Mediterranean Diets are healthier choices for HTN.    Erectile dysfunction, unspecified erectile dysfunction type -     Testosterone; Future -     Ambulatory referral to Urology     Follow Up Instructions Return in about 3 months (around 12/20/2020).     I discussed the assessment and treatment plan with the patient. The patient was provided an opportunity to ask questions and all were answered. The patient agreed with the plan and demonstrated an understanding of the instructions.   The patient was advised to  call back or seek an in-person evaluation if the symptoms worsen or if the condition fails to improve as anticipated.  I provided 16 minutes of non-face-to-face time during this encounter including median intraservice time, reviewing previous notes, labs, imaging, medications and explaining diagnosis and management.  [name], FNP-BC

## 2020-09-22 ENCOUNTER — Ambulatory Visit: Payer: Medicare Other | Attending: Nurse Practitioner

## 2020-09-22 ENCOUNTER — Other Ambulatory Visit: Payer: Self-pay

## 2020-09-22 DIAGNOSIS — I10 Essential (primary) hypertension: Secondary | ICD-10-CM | POA: Diagnosis not present

## 2020-09-22 DIAGNOSIS — N529 Male erectile dysfunction, unspecified: Secondary | ICD-10-CM

## 2020-09-23 LAB — CMP14+EGFR
ALT: 57 IU/L — ABNORMAL HIGH (ref 0–44)
AST: 35 IU/L (ref 0–40)
Albumin/Globulin Ratio: 1.5 (ref 1.2–2.2)
Albumin: 4.5 g/dL (ref 3.8–4.8)
Alkaline Phosphatase: 105 IU/L (ref 44–121)
BUN/Creatinine Ratio: 3 — ABNORMAL LOW (ref 10–24)
BUN: 3 mg/dL — ABNORMAL LOW (ref 8–27)
Bilirubin Total: <0.2 mg/dL (ref 0.0–1.2)
CO2: 27 mmol/L (ref 20–29)
Calcium: 9.8 mg/dL (ref 8.6–10.2)
Chloride: 100 mmol/L (ref 96–106)
Creatinine, Ser: 0.98 mg/dL (ref 0.76–1.27)
GFR calc Af Amer: 92 mL/min/{1.73_m2} (ref >59)
GFR calc non Af Amer: 80 mL/min/{1.73_m2} (ref >59)
Globulin, Total: 3 g/dL (ref 1.5–4.5)
Glucose: 102 mg/dL — ABNORMAL HIGH (ref 65–99)
Potassium: 4.1 mmol/L (ref 3.5–5.2)
Sodium: 142 mmol/L (ref 134–144)
Total Protein: 7.5 g/dL (ref 6.0–8.5)

## 2020-09-23 LAB — TESTOSTERONE: Testosterone: 382 ng/dL (ref 264–916)

## 2020-09-26 ENCOUNTER — Ambulatory Visit (HOSPITAL_COMMUNITY): Payer: Medicare Other | Attending: Surgery

## 2020-09-26 ENCOUNTER — Encounter: Payer: Medicare Other | Admitting: Surgery

## 2020-09-29 ENCOUNTER — Other Ambulatory Visit: Payer: Self-pay | Admitting: Family Medicine

## 2020-09-29 ENCOUNTER — Other Ambulatory Visit: Payer: Self-pay | Admitting: Nurse Practitioner

## 2020-09-29 DIAGNOSIS — M25511 Pain in right shoulder: Secondary | ICD-10-CM

## 2020-09-29 DIAGNOSIS — L80 Vitiligo: Secondary | ICD-10-CM

## 2020-11-05 IMAGING — CT CT CERVICAL SPINE W/O CM
3 series · 14 of 35 positions shown, 17 images · non-contrast
Comparison: CT head and cervical spine 08/25/2018

CLINICAL DATA: Patient status post fall.

EXAM:
CT HEAD WITHOUT CONTRAST
CT CERVICAL SPINE WITHOUT CONTRAST
TECHNIQUE: Multidetector CT imaging of the head and cervical spine was
performed following the standard protocol without intravenous
contrast. Multiplanar CT image reconstructions of the cervical spine
were also generated.

[Series 1: head wo · axial · 0.50mm/px · z∈[+1631,+1751]mm · 6 of 32 slices shown, 8 images]
[im 5/32  soft-tissue]
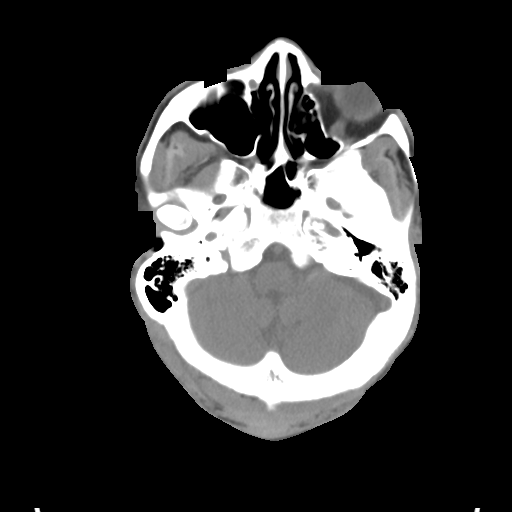
[im 5/32  bone]
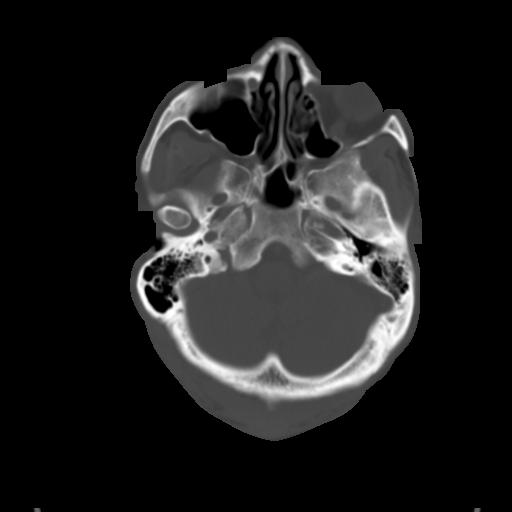
[im 10/32  bone]
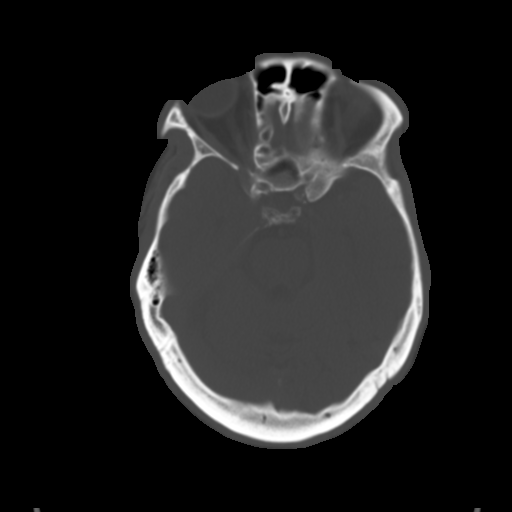
[im 15/32  bone]
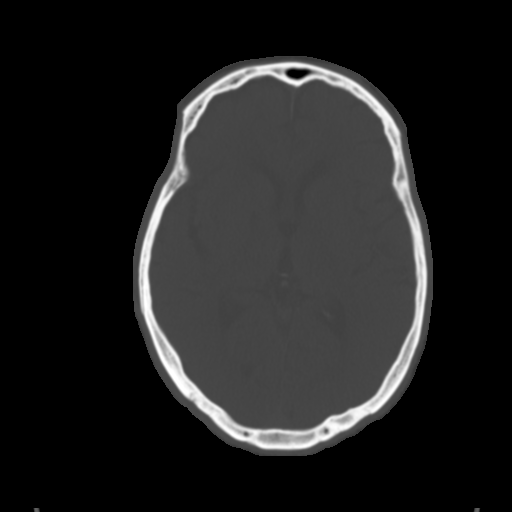
[im 20/32  bone]
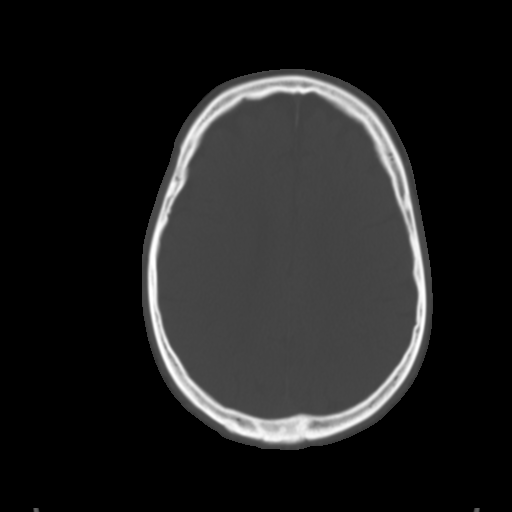
[im 24/32  soft-tissue]
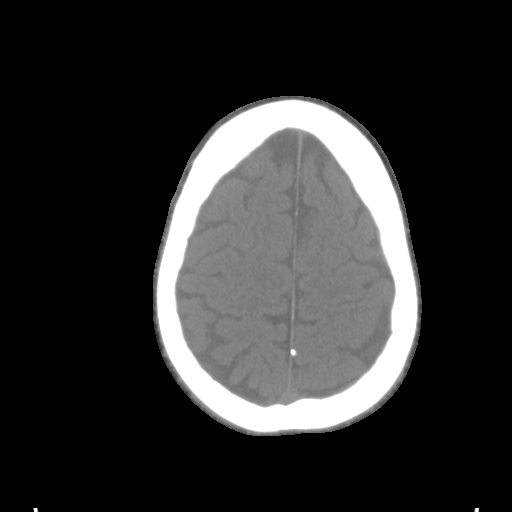
[im 24/32  bone]
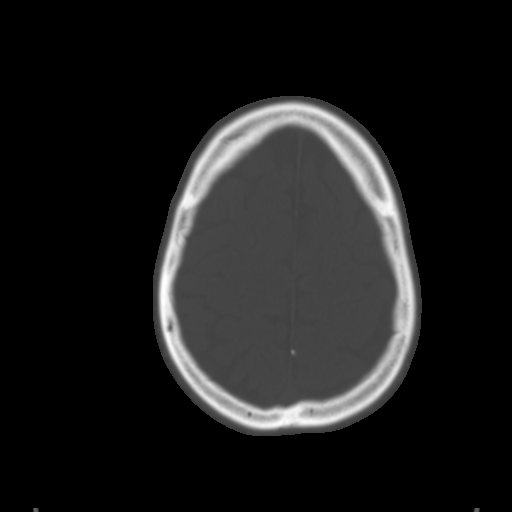
[im 29/32  bone]
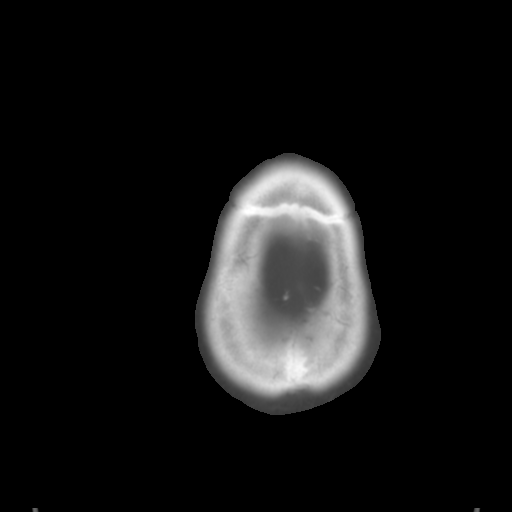

[Series 5: coronal soft tissue · coronal · 0.34mm/px · 3 of 79 slices shown]
[im 16/79  bone]
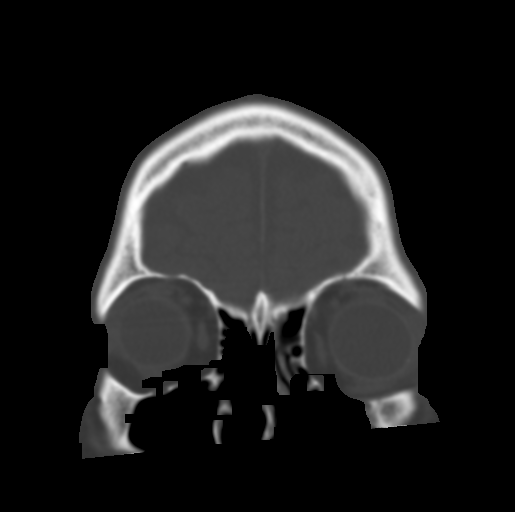
[im 32/79  bone]
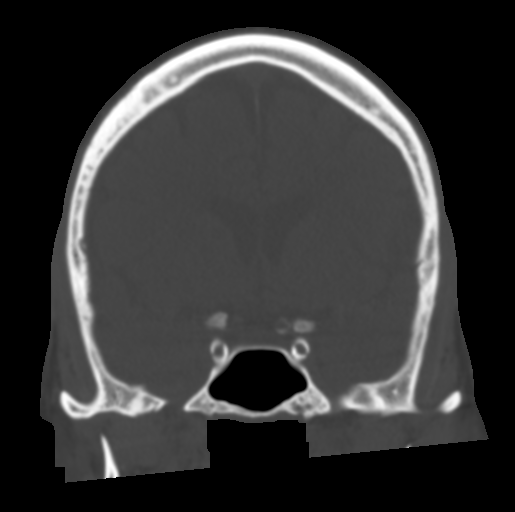
[im 47/79  bone]
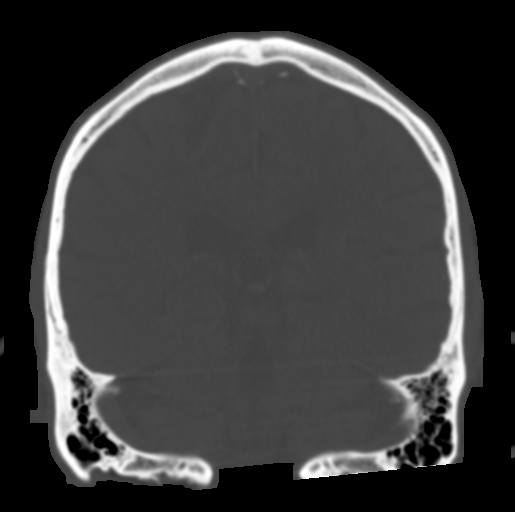

[Series 6: sagittal soft tissue · sagittal · 0.33mm/px · 5 of 55 slices shown, 6 images]
[im 19/55  bone]
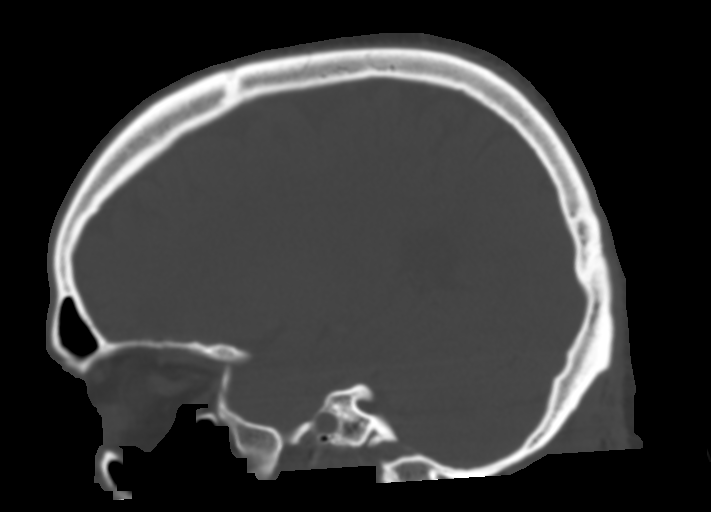
[im 23/55  bone]
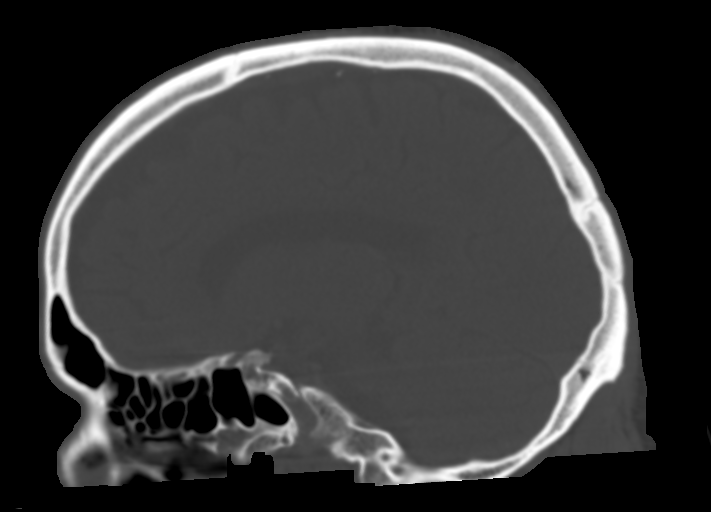
[im 28/55  soft-tissue]
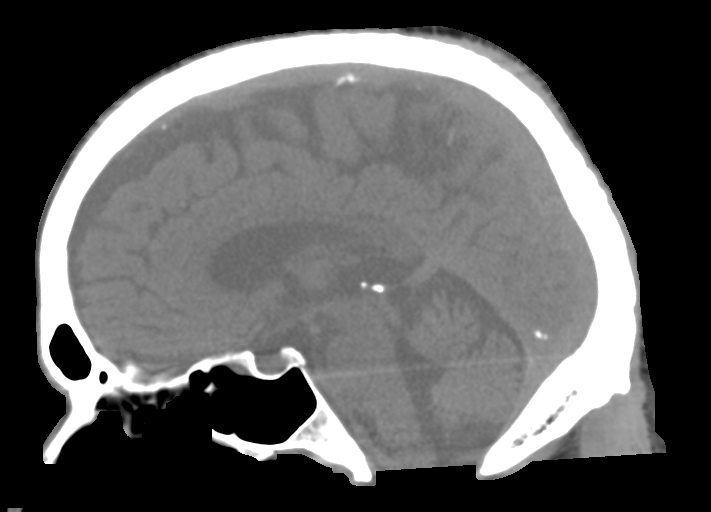
[im 28/55  bone]
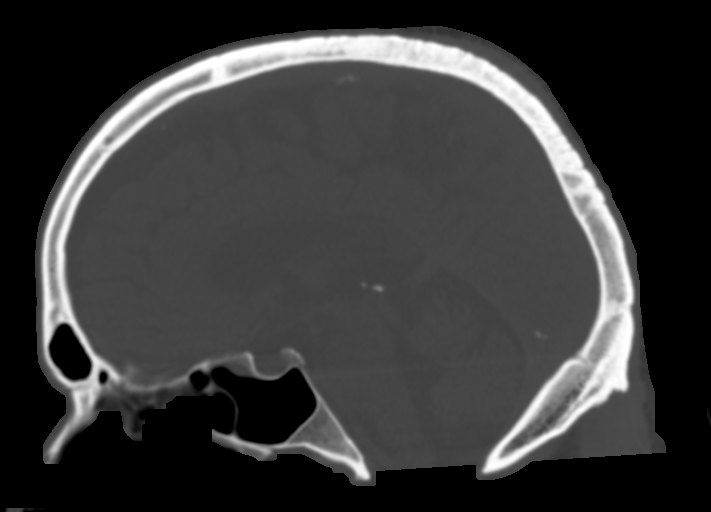
[im 32/55  bone]
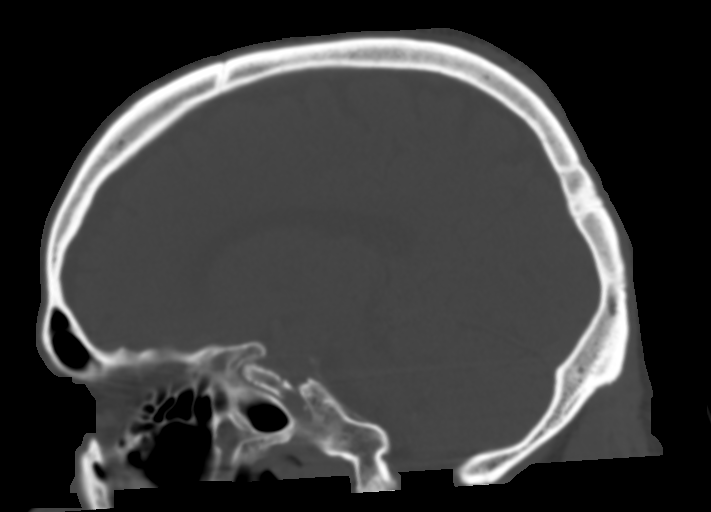
[im 37/55  bone]
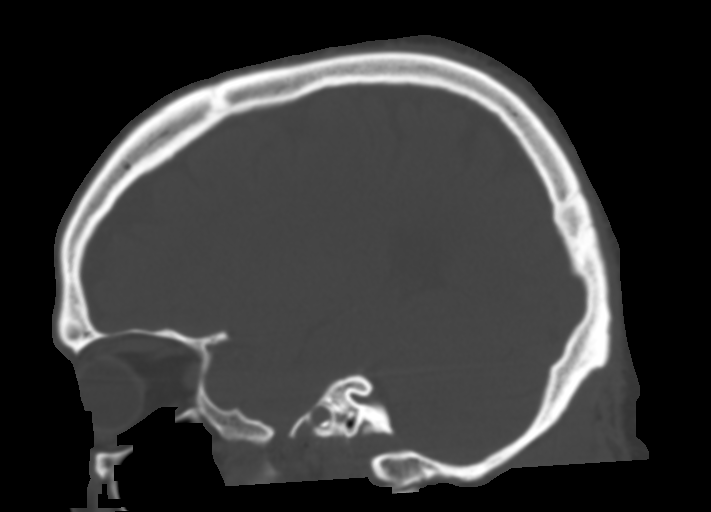

[14 of 35 positions shown; findings below may reference images not displayed]

FINDINGS: CT HEAD FINDINGS

Brain: No evidence for acute cortically based infarct, intracranial
hemorrhage, mass lesion or mass-effect. Encephalomalacia inferior
left frontal lobe likely sequelae old left frontal lobe hematoma.
Old right basal ganglia infarct.

Vascular: Unremarkable

Skull: Intact.

Sinuses/Orbits: Paranasal sinuses are well aerated. Mastoid air
cells are unremarkable. Orbits are unremarkable. Old inferior left
orbital fracture.

Other: None.

CT CERVICAL SPINE FINDINGS

Alignment: Normal anatomic alignment

Skull base and vertebrae: Intact.

Soft tissues and spinal canal: Unremarkable.

Disc levels: Multilevel degenerative disc disease most pronounced
C3-4 and C6-7. No evidence for acute fracture.

Upper chest: Emphysematous changes.

Other: None.
IMPRESSION: No acute intracranial process.

No acute cervical spine fracture

## 2020-11-05 IMAGING — CR DG RIBS W/ CHEST 3+V*R*
5 series · 5 of 5 positions shown · non-contrast
Comparison: Radiographs August 25, 2018.

CLINICAL DATA: Right rib pain after fall.

EXAM:
RIGHT RIBS AND CHEST - 3+ VIEW

[t chest supine (1 of 2)]
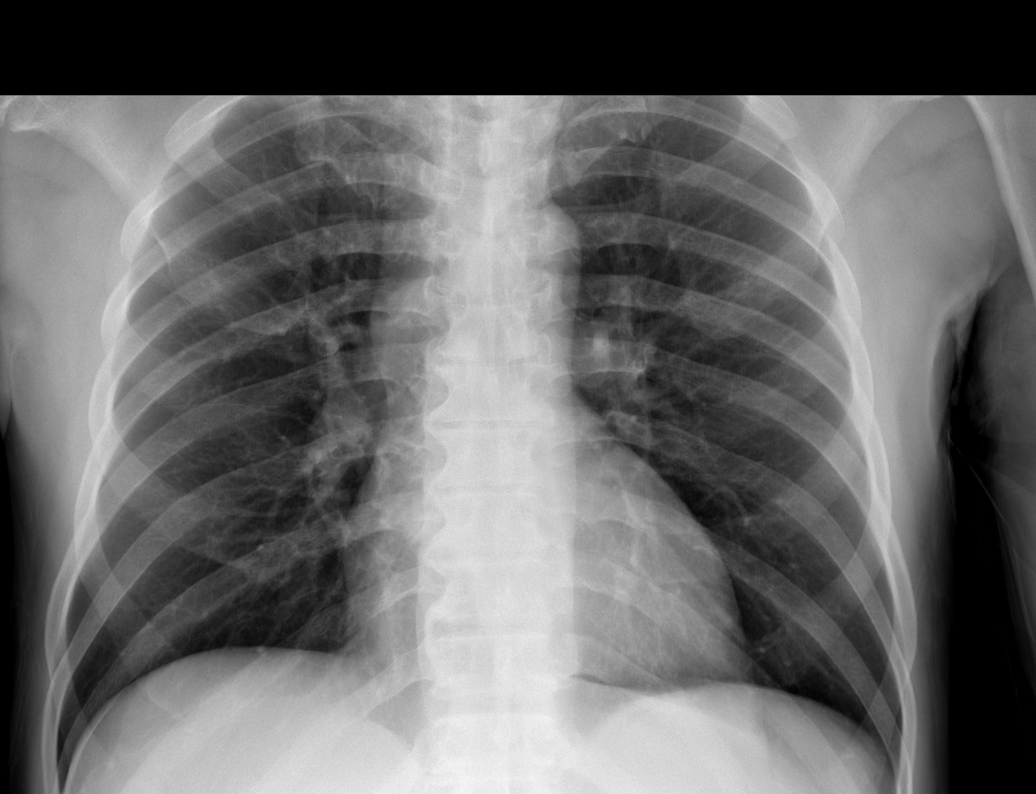

[t chest supine (2 of 2)]
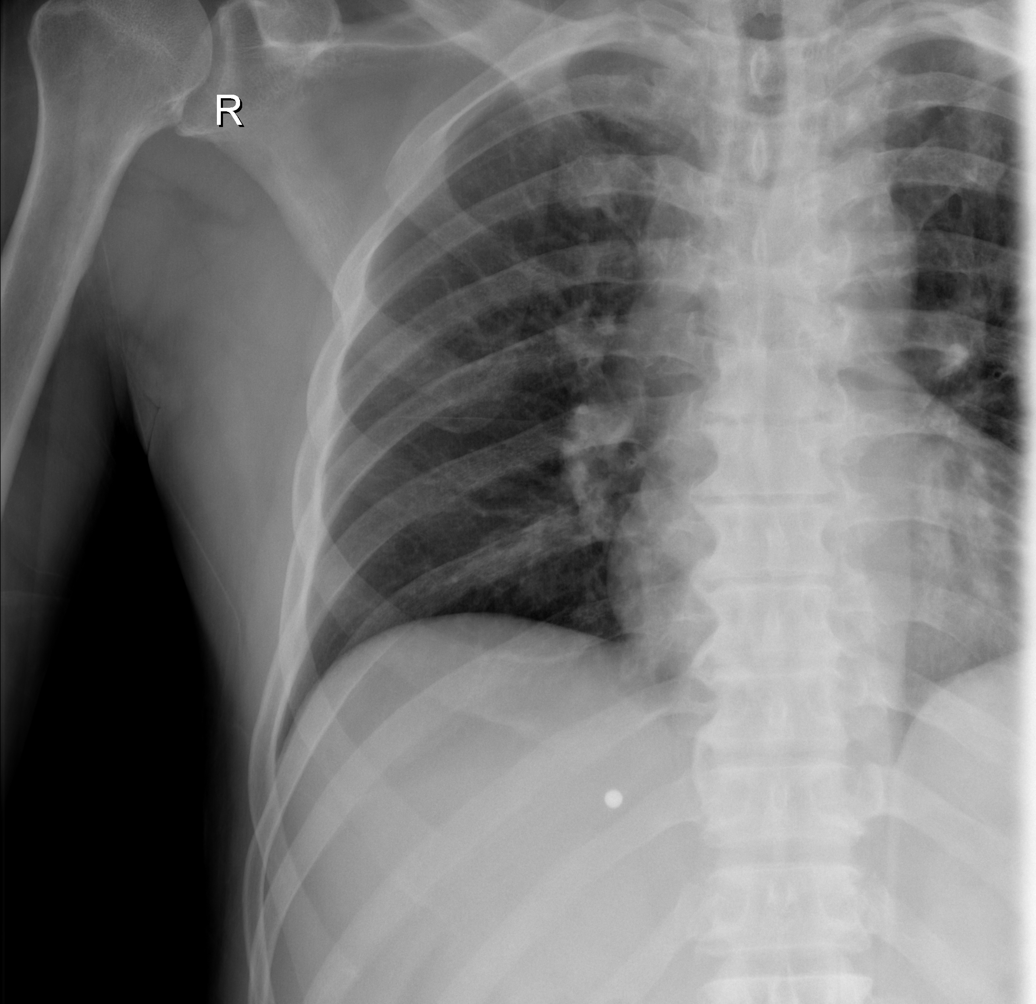

[t ribs ap lower right]
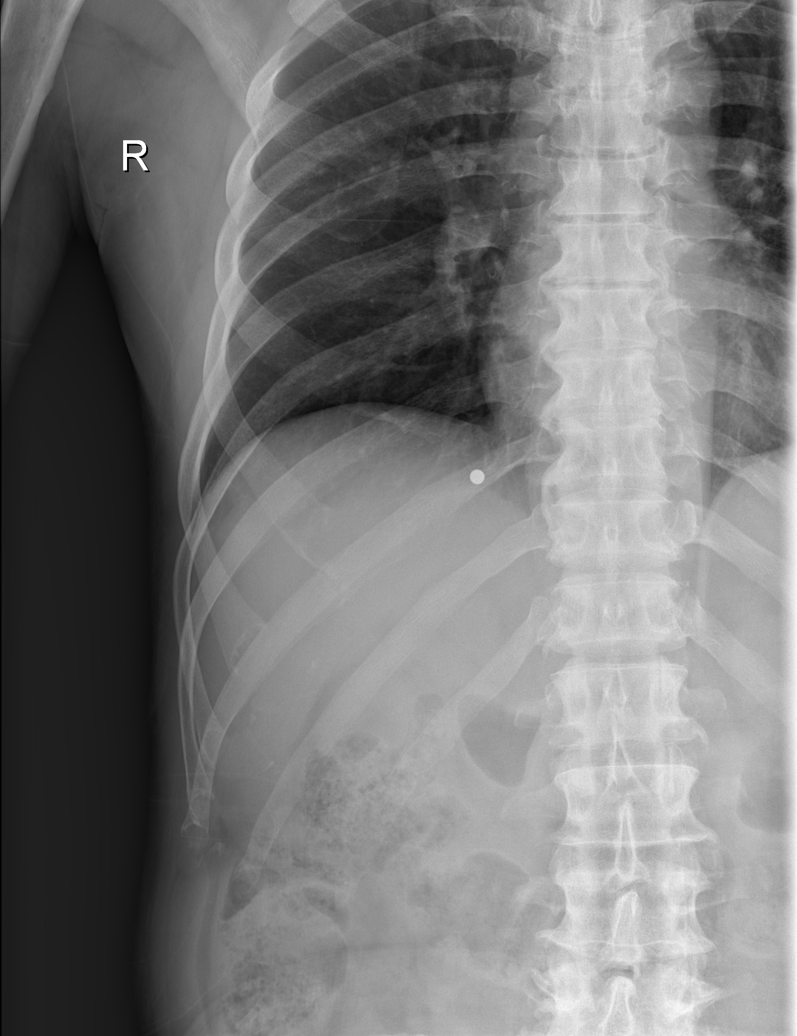

[t ribs rpo right (1 of 2)]
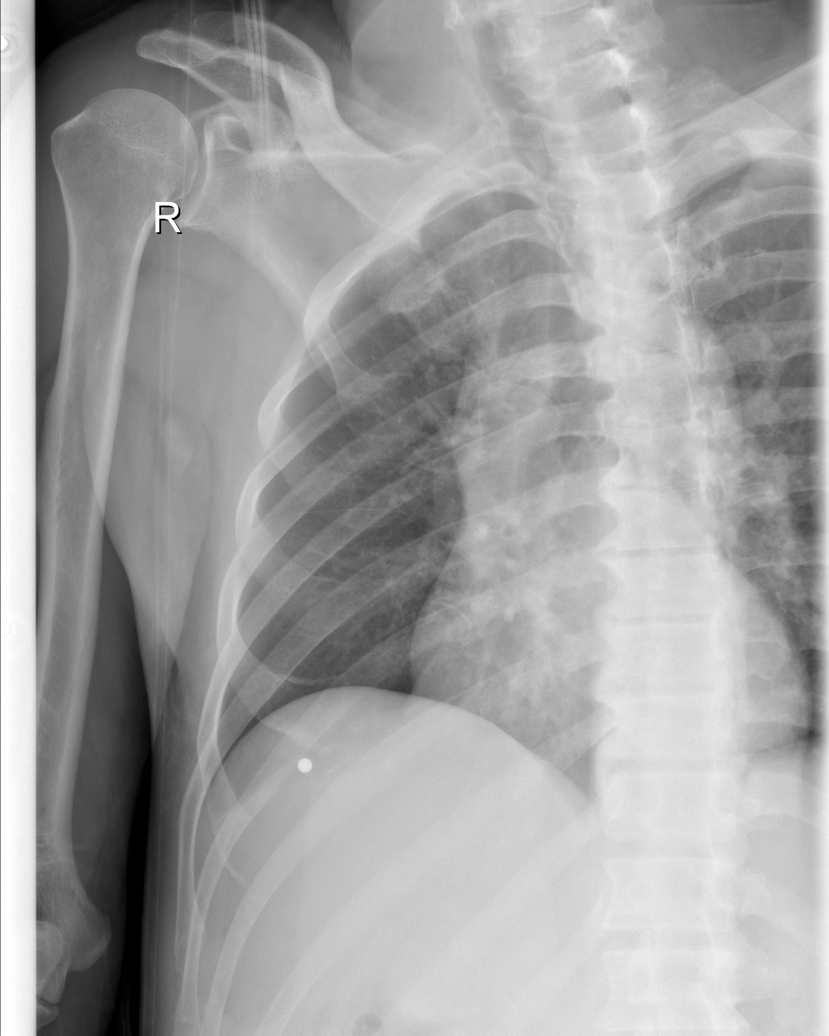

[t ribs rpo right (2 of 2)]
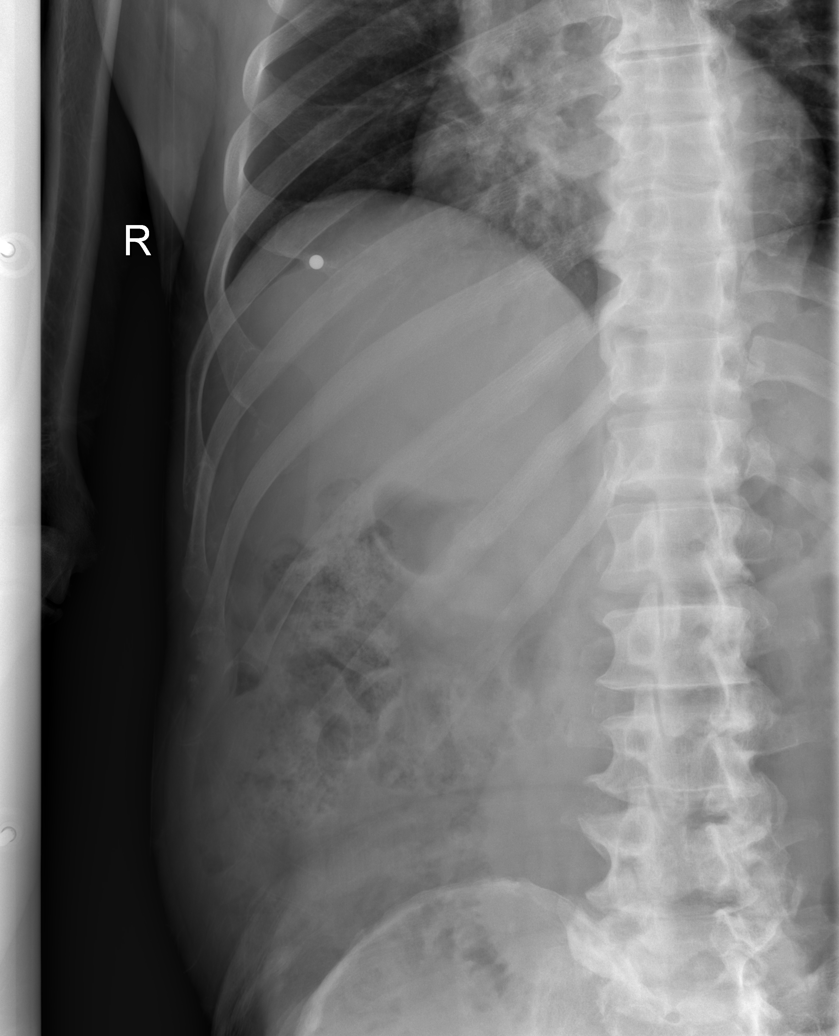

[5 of 5 positions shown; findings below may reference images not displayed]

FINDINGS: No fracture or other bone lesions are seen involving the ribs. There
is no evidence of pneumothorax or pleural effusion. Both lungs are
clear. Heart size and mediastinal contours are within normal limits.
IMPRESSION: Negative.

## 2020-11-11 ENCOUNTER — Ambulatory Visit: Payer: Medicare Other | Attending: Internal Medicine

## 2020-11-11 ENCOUNTER — Encounter: Payer: Self-pay | Admitting: Nurse Practitioner

## 2020-11-11 DIAGNOSIS — Z23 Encounter for immunization: Secondary | ICD-10-CM

## 2020-11-11 NOTE — Progress Notes (Signed)
   Covid-19 Vaccination Clinic  Name:  Jeffrey Mullen    MRN: [id number] DOB: 09-Jul-1951  11/11/2020  Jeffrey Mullen was observed post Covid-19 immunization for 15 minutes without incident. He was provided with Vaccine Information Sheet and instruction to access the V-Safe system.   Jeffrey Mullen was instructed to call 911 with any severe reactions post vaccine: Marland Kitchen Difficulty breathing  . Swelling of face and throat  . A fast heartbeat  . A bad rash all over body  . Dizziness and weakness   Immunizations Administered    Name Date Dose VIS Date Route   Pfizer COVID-19 Vaccine 11/11/2020  2:20 PM 0.3 mL 10/12/2020 Intramuscular   Manufacturer: [place]   Lot: [id number]   [place]: [id number]

## 2020-11-16 DIAGNOSIS — H409 Unspecified glaucoma: Secondary | ICD-10-CM | POA: Diagnosis not present

## 2020-11-16 DIAGNOSIS — F17210 Nicotine dependence, cigarettes, uncomplicated: Secondary | ICD-10-CM | POA: Diagnosis not present

## 2020-11-16 DIAGNOSIS — I10 Essential (primary) hypertension: Secondary | ICD-10-CM | POA: Diagnosis not present

## 2020-11-16 DIAGNOSIS — H269 Unspecified cataract: Secondary | ICD-10-CM | POA: Diagnosis not present

## 2020-11-22 ENCOUNTER — Telehealth: Payer: Self-pay | Admitting: Nurse Practitioner

## 2020-11-22 NOTE — Telephone Encounter (Signed)
Medication not on current medication list. Please review for refill

## 2020-11-22 NOTE — Telephone Encounter (Signed)
Medication Refill - Medication: mupirocin ointment   Has the patient contacted their pharmacy? Yes.   (Agent: If no, request that the patient contact the pharmacy for the refill.) (Agent: If yes, when and what did the pharmacy advise?)  Preferred Pharmacy (with phone number or street name):  [facility], [place]  [place] [address]  Phone: [phone] Fax: [phone]  Hours: Not open 24 hours     Agent: Please be advised that RX refills may take up to 3 business days. We ask that you follow-up with your pharmacy.

## 2020-11-23 ENCOUNTER — Other Ambulatory Visit: Payer: Self-pay | Admitting: Nurse Practitioner

## 2020-11-23 ENCOUNTER — Encounter: Payer: Self-pay | Admitting: Nurse Practitioner

## 2020-11-23 DIAGNOSIS — H2511 Age-related nuclear cataract, right eye: Secondary | ICD-10-CM | POA: Diagnosis not present

## 2020-11-23 DIAGNOSIS — Q150 Congenital glaucoma: Secondary | ICD-10-CM | POA: Diagnosis not present

## 2020-11-23 DIAGNOSIS — H25011 Cortical age-related cataract, right eye: Secondary | ICD-10-CM | POA: Diagnosis not present

## 2020-11-23 DIAGNOSIS — H25041 Posterior subcapsular polar age-related cataract, right eye: Secondary | ICD-10-CM | POA: Diagnosis not present

## 2020-11-23 MED ORDER — BACTROBAN NASAL 2 % NA OINT: 1.0000 "application " | TOPICAL_OINTMENT | Freq: Two times a day (BID) | NASAL | 1 refills | Status: DC

## 2020-11-24 NOTE — Telephone Encounter (Signed)
PCP refilled it.

## 2020-12-07 ENCOUNTER — Encounter: Payer: Self-pay | Admitting: Nurse Practitioner

## 2020-12-07 ENCOUNTER — Ambulatory Visit: Payer: Medicare Other | Attending: Nurse Practitioner | Admitting: Nurse Practitioner

## 2020-12-07 ENCOUNTER — Other Ambulatory Visit: Payer: Self-pay

## 2020-12-07 VITALS — BP 145/71 | HR 64 | Temp 98.1°F | Ht 71.0 in | Wt 181.2 lb

## 2020-12-07 DIAGNOSIS — I739 Peripheral vascular disease, unspecified: Secondary | ICD-10-CM

## 2020-12-07 DIAGNOSIS — I10 Essential (primary) hypertension: Secondary | ICD-10-CM

## 2020-12-07 DIAGNOSIS — Z23 Encounter for immunization: Secondary | ICD-10-CM | POA: Diagnosis not present

## 2020-12-07 DIAGNOSIS — J3489 Other specified disorders of nose and nasal sinuses: Secondary | ICD-10-CM | POA: Diagnosis not present

## 2020-12-07 NOTE — Progress Notes (Signed)
Assessment & Plan:  [place] was seen today for follow-up.  Diagnoses and all orders for this visit:  Essential hypertension -     Basic metabolic panel Continue all antihypertensives as prescribed.  Remember to bring in your blood pressure log with you for your follow up appointment.  DASH/Mediterranean Diets are healthier choices for HTN.    Claudication [facility]) -     Ambulatory referral to Vascular Surgery  Internal nasal lesion -     Ambulatory referral to ENT  Immunization due -     Tdap vaccine greater than or equal to 7yo IM    Patient has been counseled on age-appropriate routine health concerns for screening and prevention. These are reviewed and up-to-date. Referrals have been placed accordingly. Immunizations are up-to-date or declined.    Subjective:   Chief Complaint  Patient presents with   Follow-up    Pt. Would like to discuss his Testosterone level results, and would like to increase his Ibuprofen dose due to his back and leg pain.    HPI Jeffrey Mullen 69 y.o. male presents to office today for follow up.   Right Cataract surgery scheduld for Jan 7th. 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 Other pertinent PMH includes glaucoma, hepatitis C, HTN, hepatic alcoholic cirrhosis, tobacco dependence.   Essential hypertension Blood pressure is not well controlled. He did not take his blood pressure medication today. Currently prescribed amlodipine 10 mg daily and zestoretic 20-25 mg daily. Denies chest pain, shortness of breath, palpitations, lightheadedness, dizziness, headaches or BLE edema. If continues elevated we may need to add additional agent.  BP Readings from Last 3 Encounters:  12/07/20 (!) 145/71  08/26/20 (!) 155/77  08/25/20 (!) 161/83    Claudication He has a history of abnormal ABI. Was referred to Vascular however he no showed. He is requesting to be referred again. I have instructed him that he needs to make sure  he calls at least 24 hours in advance if he can not make his appointments.   Nasal lesion He has a right anterior septal lesion that we have been treating with mupirocin for several months now that still has not completely healed. At this time I will refer him to ENT for evaluation.   Review of Systems  Constitutional: Negative for fever, malaise/fatigue and weight loss.  HENT: Negative for nosebleeds.   Eyes: Negative for blurred vision, double vision and photophobia.       SEE HPI  Respiratory: Negative.  Negative for cough and shortness of breath.   Cardiovascular: Positive for claudication. Negative for chest pain, palpitations and leg swelling.  Gastrointestinal: Negative.  Negative for heartburn, nausea and vomiting.  Musculoskeletal: Positive for joint pain. Negative for myalgias.  Skin:       SEE HPI  Neurological: Negative.  Negative for dizziness, focal weakness, seizures and headaches.  Psychiatric/Behavioral: Negative.  Negative for suicidal ideas.    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   Outpatient Medications Prior to Visit  Medication Sig Dispense Refill   amLODipine (NORVASC) 10 MG tablet TAKE ONE TABLET BY MOUTH DAILY 90  tablet 0   atropine 1 % ophthalmic solution Place 1 drop into the left eye 2 (two) times daily. 15 mL 2   bimatoprost (LUMIGAN) 0.01 % SOLN Place 1 drop into the right eye at bedtime.     dorzolamide-timolol (COSOPT) 22.3-6.8 MG/ML ophthalmic solution Place 1 drop into the right eye 2 (two) times daily.     ibuprofen (ADVIL) 600 MG tablet TAKE ONE TABLET BY MOUTH EVERY 8 HOURS AS NEEDED FOR MODERATE PAIN 60 tablet 0   lisinopril-hydrochlorothiazide (ZESTORETIC) 20-25 MG tablet TAKE ONE TABLET BY MOUTH DAILY 90 tablet 0   mupirocin nasal ointment (BACTROBAN NASAL) 2 % Place 1 application into the nose 2 (two) times daily. After application, press sides of nose together and gently massage. 10 g 1   OVER THE COUNTER MEDICATION Vision formula with lutein. One tablet daily.     OVER THE COUNTER MEDICATION Glucosamine chondroitin, 1200 mg. One tablet 2 times daily.     sildenafil (VIAGRA) 100 MG tablet Take 0.5-1 tablets (50-100 mg total) by mouth daily as needed for erectile dysfunction. 10 tablet prn   Soap & Cleansers (SAYMAN LANOLIN) BAR Apply to affected area 1 each prn   timolol (BETIMOL) 0.25 % ophthalmic solution 1-2 drops 2 (two) times daily.     triamcinolone (KENALOG) 0.025 % ointment APPLY ONE APPLICATION TOPICALLY TWICE A DAY 30 g 0   No facility-administered medications prior to visit.    Allergies  Allergen Reactions   Acetaminophen Other (See Comments)    Causes eye pressure to elevate; CANNOT HAVE THIS       Objective:    BP (!) 145/71 (BP Location: Left Arm, Patient Position: Sitting, Cuff Size: Normal)    Pulse 64    Temp 98.1 F (36.7 C) (Oral)    Ht 5\' 11"  (1.803 m)    Wt 181 lb 3.2 oz (82.2 kg)    SpO2 97%    BMI 25.27 kg/m  Wt Readings from Last 3 Encounters:  12/07/20 181 lb 3.2 oz (82.2 kg)  08/26/20 176 lb (79.8 kg)  08/25/20 176 lb (79.8 kg)    Physical Exam Vitals and nursing note reviewed.  Constitutional:      Appearance: He is well-developed  and well-nourished.  HENT:     Head: Normocephalic and atraumatic.     Nose:   Eyes:     Extraocular Movements: EOM normal.  Cardiovascular:     Rate and Rhythm: Normal rate and regular rhythm.     Pulses: Intact distal pulses.     Heart sounds: Normal heart sounds. No murmur heard. No friction rub. No gallop.   Pulmonary:     Effort: Pulmonary effort is normal. No tachypnea or respiratory distress.     Breath sounds: Normal breath sounds. No decreased breath sounds,  wheezing, rhonchi or rales.  Chest:     Chest wall: No tenderness.  Abdominal:     General: Bowel sounds are normal.     Palpations: Abdomen is soft.  Musculoskeletal:        General: No edema. Normal range of motion.     Cervical back: Normal range of motion.  Skin:    General: Skin is warm and dry.  Neurological:     Mental Status: He is alert and oriented to person, place, and time.     Coordination: Coordination normal.  Psychiatric:        Mood and Affect: Mood and affect normal.        Behavior: Behavior normal. Behavior is cooperative.        Thought Content: Thought content normal.        Judgment: Judgment normal.          Patient has been counseled extensively about nutrition and exercise as well as the importance of adherence with medications and regular follow-up. The patient was given clear instructions to go to ER or return to medical center if symptoms don't improve, worsen or new problems develop. The patient verbalized understanding.   Follow-up: Return in about 3 months (around 03/07/2021).   [name], FNP-BC [facility] [place], [place]   12/07/2020, 11:30 PM

## 2020-12-07 NOTE — Patient Instructions (Signed)
[facility] clinic in [place], [place] Address: [address], [place], [place] [address] Phone: [phone]

## 2020-12-08 ENCOUNTER — Telehealth: Payer: Self-pay | Admitting: Nurse Practitioner

## 2020-12-08 ENCOUNTER — Encounter: Payer: Self-pay | Admitting: Nurse Practitioner

## 2020-12-08 LAB — BASIC METABOLIC PANEL
BUN/Creatinine Ratio: 9 — ABNORMAL LOW (ref 10–24)
BUN: 10 mg/dL (ref 8–27)
CO2: 25 mmol/L (ref 20–29)
Calcium: 9.5 mg/dL (ref 8.6–10.2)
Chloride: 101 mmol/L (ref 96–106)
Creatinine, Ser: 1.1 mg/dL (ref 0.76–1.27)
GFR calc Af Amer: 80 mL/min/{1.73_m2} (ref >59)
GFR calc non Af Amer: 70 mL/min/{1.73_m2} (ref >59)
Glucose: 95 mg/dL (ref 65–99)
Potassium: 4.4 mmol/L (ref 3.5–5.2)
Sodium: 142 mmol/L (ref 134–144)

## 2020-12-08 NOTE — Telephone Encounter (Signed)
Copied from [place] [id number]. Topic: General - Other >> Dec 07, 2020  4:22 PM Rainey Pines A wrote: Patient called and wants a callback from nurse to confirm that his ibprofen medication dosage will be increased as discussed during his visit. Please advise >> Dec 08, 2020 11:46 AM Keene Breath wrote: Patient is calling again to see if his request to increase his medication to 800 MG has been approved  He stated he still has not heard from the pharmacy.  Please advise and call patient to let him know at [phone]

## 2020-12-08 NOTE — Telephone Encounter (Signed)
Called patient and LVM letting him know to call back [place] to verify his address and phone number so that we could schedule [facility] appt tomorrow at [facility]. Advised patient to call [phone].

## 2020-12-08 NOTE — Telephone Encounter (Signed)
Copied from [place] [phone]. Topic: General - Other >> Dec 07, 2020  4:11 PM Celene Kras wrote: Reason for CRM: Jeneen Rinks, from logistic care, calling stating that the pt is just now calling for transportation for his appt on 12/09/20. He states that they will not be able to get him transportation and is requesting to have the appt rescheduled. Please advise.  Could we schedule this patient with [facility] so that his appointment does not need to be rescheduled?

## 2020-12-09 ENCOUNTER — Other Ambulatory Visit: Payer: Self-pay

## 2020-12-09 ENCOUNTER — Other Ambulatory Visit: Payer: Self-pay | Admitting: Nurse Practitioner

## 2020-12-09 ENCOUNTER — Encounter: Payer: Self-pay | Admitting: Otolaryngology

## 2020-12-09 ENCOUNTER — Ambulatory Visit: Payer: Medicare Other | Attending: Otolaryngology | Admitting: Otolaryngology

## 2020-12-09 VITALS — BP 121/69 | HR 66 | Ht 71.0 in | Wt 181.0 lb

## 2020-12-09 DIAGNOSIS — D369 Benign neoplasm, unspecified site: Secondary | ICD-10-CM | POA: Diagnosis not present

## 2020-12-09 DIAGNOSIS — G8929 Other chronic pain: Secondary | ICD-10-CM

## 2020-12-09 DIAGNOSIS — L821 Other seborrheic keratosis: Secondary | ICD-10-CM | POA: Diagnosis not present

## 2020-12-09 MED ORDER — IBUPROFEN 800 MG PO TABS: 800.0000 mg | ORAL_TABLET | Freq: Three times a day (TID) | ORAL | 1 refills | Status: DC | PRN

## 2020-12-09 MED ORDER — LIDOCAINE-EPINEPHRINE 2 %-1:100000 IJ SOLN
1.7000 mL | Freq: Once | INTRAMUSCULAR | Status: AC
  Administered 2020-12-09: 12:00:00 1.7 mL via INTRADERMAL

## 2020-12-09 NOTE — Patient Instructions (Signed)
I removed a small growth from inside your right nose today.  I do not think this is cancer.  We will have the pathologist look at it and we will call you with the results.  Keep using the mupirocin ointment twice daily for the next week.  This should heal quickly.  No return visit necessary.

## 2020-12-09 NOTE — Progress Notes (Signed)
History: 69 year old black male noticed a mass in his right nose.  He attempted to pick it out 2-3 months ago with ensuing pain and bleeding.  He is here for further evaluation.  He is able to breathe through the nose.  No history of nasal trauma, surgery, or cocaine use.  Examination: He is pleasant and well-developed.  Cranial nerves intact.  Ear canals are clear with normal drums.  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 Intranasally, he has a 1.5 x 3 mm apparent squamous papilloma at the 3 o'clock position of the right nasal vestibule.  Otherwise the nasal mucosa is congested but intact. Oral cavity shows teeth in fair repair.  Oral pharynx clear.  Neck unremarkable.  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  Plan: Excised under local anesthesia.  We will call him with the pathologic interpretation.  No return visit necessary.   Procedure:  Excision right intranasal lesion  With informed consent, I trimmed the nasal vibrissae.  2% Xylocaine with 1-100,000 epinephrine, 1.5 cc was infiltrated into the base of the lesion.  Betadine solution was used to clean the area.  Several minutes were allowed for anesthesia to take effect.  The lesion was grasped and amputated at its base.  It was sent for pathologic interpretation.  The wound was cauterized with silver nitrate for hemostasis.  He tolerated this well.   I gave him instructions for wound hygiene.  We will call him with the pathology report.  No return visit necessary.

## 2020-12-09 NOTE — Progress Notes (Signed)
Has a nasal sore in his right nostril.

## 2020-12-15 LAB — ANATOMIC PATHOLOGY REPORT

## 2020-12-20 ENCOUNTER — Other Ambulatory Visit: Payer: Self-pay

## 2020-12-20 DIAGNOSIS — I739 Peripheral vascular disease, unspecified: Secondary | ICD-10-CM

## 2020-12-21 ENCOUNTER — Ambulatory Visit: Payer: Medicare Other | Admitting: Nurse Practitioner

## 2020-12-26 ENCOUNTER — Ambulatory Visit: Payer: Medicare Other | Admitting: Dermatology

## 2021-01-24 ENCOUNTER — Ambulatory Visit (INDEPENDENT_AMBULATORY_CARE_PROVIDER_SITE_OTHER): Payer: Medicare Other | Admitting: Vascular Surgery

## 2021-01-24 ENCOUNTER — Ambulatory Visit (HOSPITAL_COMMUNITY)
Admission: RE | Admit: 2021-01-24 | Discharge: 2021-01-24 | Disposition: A | Discharge status: Home / Self Care | Payer: Medicare Other | Source: Ambulatory Visit | Attending: Vascular Surgery | Admitting: Vascular Surgery

## 2021-01-24 ENCOUNTER — Encounter: Payer: Self-pay | Admitting: Vascular Surgery

## 2021-01-24 ENCOUNTER — Other Ambulatory Visit: Payer: Self-pay

## 2021-01-24 VITALS — BP 135/73 | HR 71 | Temp 98.2°F | Resp 20 | Ht 71.0 in | Wt 185.0 lb

## 2021-01-24 DIAGNOSIS — I739 Peripheral vascular disease, unspecified: Secondary | ICD-10-CM | POA: Diagnosis not present

## 2021-01-24 DIAGNOSIS — I70213 Atherosclerosis of native arteries of extremities with intermittent claudication, bilateral legs: Secondary | ICD-10-CM

## 2021-01-24 MED ORDER — ATORVASTATIN CALCIUM 40 MG PO TABS: 40.0000 mg | ORAL_TABLET | Freq: Every day | ORAL | 11 refills | Status: DC

## 2021-01-24 MED ORDER — ASPIRIN EC 81 MG PO TBEC: 81.0000 mg | DELAYED_RELEASE_TABLET | Freq: Every day | ORAL | Status: DC

## 2021-01-24 NOTE — Progress Notes (Signed)
ASSESSMENT & PLAN:  70 y.o. 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  Recommend the following which can slow the progression of atherosclerosis and reduce the risk of major adverse cardiac / limb events:  Complete cessation from all tobacco products. Blood glucose control with goal A1c < 7%. Blood pressure control with goal blood pressure < 140/90 mmHg. Lipid reduction therapy with goal LDL-C <100 mg/dL (<70 if symptomatic from PAD).  Adequate hydration (at least 2 liters / day) if patient's heart and kidney function is adequate. Aspirin 81mg  PO QD.  Exercise regimen for intermittent claudication. Atorvastatin 40-80mg  PO QD (or other "high intensity" statin therapy).  Follow up with me in 6 months with repeat ABI. New Rx for atorvastatin. Needs LFTs checked in next several months. Will ask PCP to coordinate.   CHIEF COMPLAINT:   Bilateral calf cramping  HISTORY:  HISTORY OF PRESENT ILLNESS: Jeffrey Mullen is a 70 y.o. male referred to clinic for evaluation of bilateral discomfort in his legs when ambulating.  His symptoms are fairly classic for intermittent claudication.  He reports that 2 blocks he gets burning discomfort in his calves.  If he continues to walk this will progress to his thighs.  He also endorses erectile dysfunction.  He is a long-term smoker and is trying to cut down.  He is smoking 4 cigarettes a day.  Mostly concerned about his mobility deteriorating and requiring a wheelchair or other assistive device.  Past Medical History:  Diagnosis Date  . Alcohol abuse   . Allergy   . Anemia   . Anxiety   . Arthritis   . Blind left eye   . Cataract, right eye   . Congenital blindness    LEFT EYE  . Depression   . Glaucoma, bilateral   . Hepatic cirrhosis ([place])   . Hepatitis    HEP C treated in 2016  . HTN (hypertension)   . Seizures ([place])    3 years ago    Past Surgical History:   Procedure Laterality Date  . CATARACT EXTRACTION Left   . GLAUCOMA SURGERY     Patient reports 10 or eleven surgeries for Glaucoma    Family History  Problem Relation Age of Onset  . Hearing loss Other   . Kidney disease Neg Hx   . Colon cancer Neg Hx   . Esophageal cancer Neg Hx   . Rectal cancer Neg Hx   . Stomach cancer Neg Hx     Social History   Socioeconomic History  . Marital status: Single    Spouse name: Not on file  . Number of children: Not on file  . Years of education: Not on file  . Highest education level: Not on file  Occupational History  . Not on file  Tobacco Use  . Smoking status: Current Some Day Smoker    Packs/day: 0.25    Types: Cigarettes  . Smokeless tobacco: Never Used  Vaping Use  . Vaping Use: Never used  Substance and Sexual Activity  . Alcohol use: Yes    Alcohol/week: 0.0 standard drinks    Comment: twice a month  . Drug use: No  . Sexual activity: Not Currently  Other Topics Concern  . Not on file  Social History Narrative  . Not on file   Social Determinants of Health   Financial Resource Strain: Not on file  Food Insecurity: Not on file  Transportation Needs: Not on  file  Physical Activity: Not on file  Stress: Not on file  Social Connections: Not on file  Intimate Partner Violence: Not on file    Allergies  Allergen Reactions  . Acetaminophen Other (See Comments)    Causes eye pressure to elevate; CANNOT HAVE THIS    Current Outpatient Medications  Medication Sig Dispense Refill  . amLODipine (NORVASC) 10 MG tablet TAKE ONE TABLET BY MOUTH DAILY 90 tablet 0  . atorvastatin (LIPITOR) 40 MG tablet Take 1 tablet (40 mg total) by mouth daily. 30 tablet 11  . atropine 1 % ophthalmic solution Place 1 drop into the left eye 2 (two) times daily. 15 mL 2  . bimatoprost (LUMIGAN) 0.01 % SOLN Place 1 drop into the right eye at bedtime.    . dorzolamide-timolol (COSOPT) 22.3-6.8 MG/ML ophthalmic solution Place 1 drop into  the right eye 2 (two) times daily.    Marland Kitchen ibuprofen (ADVIL) 800 MG tablet Take 1 tablet (800 mg total) by mouth every 8 (eight) hours as needed. 60 tablet 1  . lisinopril-hydrochlorothiazide (ZESTORETIC) 20-25 MG tablet TAKE ONE TABLET BY MOUTH DAILY 90 tablet 0  . mupirocin nasal ointment (BACTROBAN NASAL) 2 % Place 1 application into the nose 2 (two) times daily. After application, press sides of nose together and gently massage. 10 g 1  . OVER THE COUNTER MEDICATION Vision formula with lutein. One tablet daily.    Marland Kitchen OVER THE COUNTER MEDICATION Glucosamine chondroitin, 1200 mg. One tablet 2 times daily.    Marland Kitchen Soap & Cleansers (SAYMAN LANOLIN) BAR Apply to affected area 1 each prn  . timolol (BETIMOL) 0.25 % ophthalmic solution 1-2 drops 2 (two) times daily.    Marland Kitchen triamcinolone (KENALOG) 0.025 % ointment APPLY ONE APPLICATION TOPICALLY TWICE A DAY 30 g 0  . sildenafil (VIAGRA) 100 MG tablet Take 0.5-1 tablets (50-100 mg total) by mouth daily as needed for erectile dysfunction. 10 tablet prn   Current Facility-Administered Medications  Medication Dose Route Frequency Provider Last Rate Last Admin  . aspirin EC tablet 81 mg  81 mg Oral Daily Cherre Robins, MD        REVIEW OF SYSTEMS:  [X]  denotes positive finding, [ ]  denotes negative finding Cardiac  Comments:  Chest pain or chest pressure:    Shortness of breath upon exertion:    Short of breath when lying flat:    Irregular heart rhythm:        Vascular    Pain in calf, thigh, or hip brought on by ambulation: x   Pain in feet at night that wakes you up from your sleep:     Blood clot in your veins:    Leg swelling:         Pulmonary    Oxygen at home:    Productive cough:     Wheezing:         Neurologic    Sudden weakness in arms or legs:     Sudden numbness in arms or legs:     Sudden onset of difficulty speaking or slurred speech:    Temporary loss of vision in one eye:     Problems with dizziness:          Gastrointestinal    Blood in stool:     Vomited blood:         Genitourinary    Burning when urinating:     Blood in urine:        Psychiatric  Major depression:         Hematologic    Bleeding problems:    Problems with blood clotting too easily:        Skin    Rashes or ulcers:        Constitutional    Fever or chills:     PHYSICAL EXAM:   Vitals:   01/24/21 1246  BP: 135/73  Pulse: 71  Resp: 20  Temp: 98.2 F (36.8 C)  SpO2: 98%  Weight: 185 lb (83.9 kg)  Height: 5\' 11"  (1.803 m)   Constitutional: well appearing in no distress. Appears well nourished.  Neurologic: CN intact. No focal findings. No sensory loss. Psychiatric: Mood and affect symmetric and appropriate. Eyes: No icterus. No conjunctival pallor. Ears, nose, throat: mucous membranes moist. Midline trachea.  Cardiac: regular rate and rhythm.  Respiratory: unlabored. Abdominal: soft, non-tender, non-distended.  Peripheral vascular:  1+ femoral pulses bilaterally  No palpable pedal pulses Extremity: No edema. No cyanosis. No pallor.  Skin: No gangrene. No ulceration.  Lymphatic: No Stemmer's sign. No palpable lymphadenopathy.    DATA REVIEW:    Most recent CBC CBC Latest Ref Rng & Units 06/20/2020 05/06/2019 05/03/2019  WBC 3.4 - 10.8 x10E3/uL 4.6 4.9 5.6  Hemoglobin 13.0 - 17.7 g/dL 13.8 10.0(L) 11.0(L)  Hematocrit 37.5 - 51.0 % 40.9 29.8(L) 32.6(L)  Platelets 150 - 450 x10E3/uL 234 186 149(L)     Most recent CMP CMP Latest Ref Rng & Units 12/07/2020 09/22/2020 06/20/2020  Glucose 65 - 99 mg/dL 95 102(H) 91  BUN 8 - 27 mg/dL 10 3(L) 9  Creatinine 0.76 - 1.27 mg/dL 1.10 0.98 1.03  Sodium 134 - 144 mmol/L 142 142 144  Potassium 3.5 - 5.2 mmol/L 4.4 4.1 4.2  Chloride 96 - 106 mmol/L 101 100 103  CO2 20 - 29 mmol/L 25 27 24   Calcium 8.6 - 10.2 mg/dL 9.5 9.8 10.4(H)  Total Protein 6.0 - 8.5 g/dL - 7.5 8.1  Total Bilirubin 0.0 - 1.2 mg/dL - <0.2 <0.2  Alkaline Phos 44 - 121 IU/L - 105 104   AST 0 - 40 IU/L - 35 35  ALT 0 - 44 IU/L - 57(H) 60(H)    Renal function CrCl cannot be calculated (Patient's most recent lab result is older than the maximum 21 days allowed.).  No results found for: HGBA1C  LDL Chol Calc (NIH)  Date Value Ref Range Status  06/20/2020 97 0 - 99 mg/dL Final     Vascular Imaging: ABI 01/24/21    Yevonne Aline. Stanford Breed, MD [facility] Phone Number: [phone] 01/24/2021 1:17 PM

## 2021-01-25 ENCOUNTER — Other Ambulatory Visit: Payer: Self-pay

## 2021-01-25 DIAGNOSIS — I70213 Atherosclerosis of native arteries of extremities with intermittent claudication, bilateral legs: Secondary | ICD-10-CM

## 2021-02-16 ENCOUNTER — Ambulatory Visit (INDEPENDENT_AMBULATORY_CARE_PROVIDER_SITE_OTHER): Payer: Medicare Other | Admitting: Dermatology

## 2021-02-16 ENCOUNTER — Encounter: Payer: Self-pay | Admitting: Dermatology

## 2021-02-16 ENCOUNTER — Other Ambulatory Visit: Payer: Self-pay

## 2021-02-16 DIAGNOSIS — Q150 Congenital glaucoma: Secondary | ICD-10-CM

## 2021-02-16 DIAGNOSIS — D239 Other benign neoplasm of skin, unspecified: Secondary | ICD-10-CM

## 2021-02-16 DIAGNOSIS — D2362 Other benign neoplasm of skin of left upper limb, including shoulder: Secondary | ICD-10-CM | POA: Diagnosis not present

## 2021-02-16 DIAGNOSIS — L309 Dermatitis, unspecified: Secondary | ICD-10-CM

## 2021-02-16 DIAGNOSIS — L810 Postinflammatory hyperpigmentation: Secondary | ICD-10-CM | POA: Diagnosis not present

## 2021-02-16 MED ORDER — TACROLIMUS 0.1 % EX OINT
TOPICAL_OINTMENT | CUTANEOUS | 2 refills | Status: DC
Start: 2021-02-16 — End: 2021-10-09

## 2021-02-20 ENCOUNTER — Telehealth: Payer: Self-pay | Admitting: Dermatology

## 2021-02-20 NOTE — Telephone Encounter (Signed)
He spoke to [place] Friday, was told to call today to get instructions on getting med (Tacrolimux), which insurance turned down.

## 2021-02-20 NOTE — Telephone Encounter (Signed)
Patient reminded at  the visit I gave him the [facility] coupon and he was also reminded to transfer the rx to any [facility] of his choice.

## 2021-02-27 ENCOUNTER — Encounter: Payer: Self-pay | Admitting: Dermatology

## 2021-02-27 NOTE — Progress Notes (Signed)
   New Patient   Subjective  Jeffrey Mullen is a 70 y.o. male who presents for the following: New Patient (Initial Visit) (Patient here today for full body skin check. PCP gave referral to see Dr. Denna Haggard for vitiligo. Ingrown hair on left forearm x years. ).  Facial rash and discoloration. Location:  Duration:  Quality:  Associated Signs/Symptoms: Modifying Factors:  Severity:  Timing: Context:    The following portions of the chart were reviewed this encounter and updated as appropriate:  Tobacco  Allergies  Meds  Problems  Med Hx  Surg Hx  Fam Hx      Objective  Well appearing patient in no apparent distress; mood and affect are within normal limits. Objective  Left Forearm - Posterior: Left forearm: Firm 5 mm dermal papule. Historically stable. Patient does pick at lesion.  Objective  Head - Anterior (Face): Central facial hypopigmentation compatible with postinflammatory dyschromia rather than vitiligo.  Objective  Head - Anterior (Face): Mild central facial dermatitis and scale compatible seborrheic dermatitis.    A focused examination was performed including Head and neck and arms.. Relevant physical exam findings are noted in the Assessment and Plan.   Assessment & Plan  Dermatofibroma Left Forearm - Posterior  Leave if stable.  Ordered Medications: tacrolimus (PROTOPIC) 0.1 % ointment  Postinflammatory hyperpigmentation Head - Anterior (Face)  If cost is reasonable/covered by insurance, will try topical tacrolimus ointment nightly for at least 1 month. Follow-up by MyChart at that time. Told that the discoloration may persist.  Ordered Medications: tacrolimus (PROTOPIC) 0.1 % ointment  Dermatitis Head - Anterior (Face)  If available, topical tacrolimus nightly for at least 1 month. Told that while this is a treatable disorder, it is not curable. This may be exacerbated if there is any ongoing alcohol consumption.

## 2021-03-06 ENCOUNTER — Other Ambulatory Visit: Payer: Self-pay

## 2021-03-06 ENCOUNTER — Ambulatory Visit: Payer: Medicare Other | Attending: Nurse Practitioner | Admitting: Nurse Practitioner

## 2021-03-06 ENCOUNTER — Encounter: Payer: Self-pay | Admitting: Nurse Practitioner

## 2021-03-06 DIAGNOSIS — I10 Essential (primary) hypertension: Secondary | ICD-10-CM

## 2021-03-06 DIAGNOSIS — F419 Anxiety disorder, unspecified: Secondary | ICD-10-CM | POA: Diagnosis not present

## 2021-03-06 MED ORDER — LISINOPRIL-HYDROCHLOROTHIAZIDE 20-25 MG PO TABS: 1.0000 | ORAL_TABLET | Freq: Every day | ORAL | 1 refills | Status: DC

## 2021-03-06 MED ORDER — AMLODIPINE BESYLATE 10 MG PO TABS: 10.0000 mg | ORAL_TABLET | Freq: Every day | ORAL | 1 refills | Status: DC

## 2021-03-06 MED ORDER — BUSPIRONE HCL 15 MG PO TABS
15.0000 mg | ORAL_TABLET | Freq: Three times a day (TID) | ORAL | 1 refills | Status: DC
Start: 2021-03-06 — End: 2021-05-19

## 2021-03-06 NOTE — Progress Notes (Signed)
Virtual Visit via Telephone Note Due to national recommendations of social distancing due to Jeffrey Mullen, telehealth visit is felt to be most appropriate for this patient at this time.  I discussed the limitations, risks, security and privacy concerns of performing an evaluation and management service by telephone and the availability of in person appointments. I also discussed with the patient that there may be a patient responsible charge related to this service. The patient expressed understanding and agreed to proceed.    I connected with Jeffrey Mullen on 03/06/21  at  11:10 AM EDT  EDT by telephone and verified that I am speaking with the correct person using two identifiers.   Consent I discussed the limitations, risks, security and privacy concerns of performing an evaluation and management service by telephone and the availability of in person appointments. I also discussed with the patient that there may be a patient responsible charge related to this service. The patient expressed understanding and agreed to proceed.   Location of Patient: Private  Residence   Location of Provider: [place] [facility] [employer] Office    Persons participating in Telemedicine visit: [name] FNP-BC [employer]    History of Present Illness: Telemedicine visit for: F/U He has a past medical history of Alcohol abuse, Allergy, Anemia, Anxiety, Arthritis, Congenital Blindness left eye, Cataract, right eye, Depression, Glaucoma, bilateral, Hepatic cirrhosis, Hepatitis, HTN and Seizures  States He is experiencing increased anxiety due to his upcoming eye surgery. Requesting anxiolytic today.   Dermatofibroma: Waiting for prior auth approval from Derm for tacrolimus. I suggested he send the office a mychart message regarding this.   Also states his prostate was enlarged on exam by Urology and PSA was elevated. Will need prostate biopsy.  Lab Results  Component Value  Date   PSA1 4.2 (H) 01/13/2019     Essential Hypertension BP readings at home averaging: 130/88. Well controlled with amlodipine 10 mg daily and lisinopril-hctz 20-25 mg daily. Denies chest pain, shortness of breath, palpitations, lightheadedness, dizziness, headaches or BLE edema. He does continue to smoke.  BP Readings from Last 3 Encounters:  01/24/21 135/73  12/09/20 121/69  12/07/20 (!) 145/71    Past Medical History:  Diagnosis Date  . Alcohol abuse   . Allergy   . Anemia   . Anxiety   . Arthritis   . Blind left eye   . Cataract, right eye   . Congenital blindness    LEFT EYE  . Depression   . Glaucoma, bilateral   . Hepatic cirrhosis ([place])   . Hepatitis    HEP C treated in 2016  . HTN (hypertension)   . Seizures ([place])    3 years ago    Past Surgical History:  Procedure Laterality Date  . CATARACT EXTRACTION Left   . GLAUCOMA SURGERY     Patient reports 10 or eleven surgeries for Glaucoma    Family History  Problem Relation Age of Onset  . Hearing loss Other   . Kidney disease Neg Hx   . Colon cancer Neg Hx   . Esophageal cancer Neg Hx   . Rectal cancer Neg Hx   . Stomach cancer Neg Hx     Social History   Socioeconomic History  . Marital status: Single    Spouse name: Not on file  . Number of children: Not on file  . Years of education: Not on file  . Highest education level: Not on file  Occupational History  . Not on file  Tobacco Use  . Smoking status: Current Some Day Smoker    Packs/day: 0.25    Types: Cigarettes  . Smokeless tobacco: Never Used  Vaping Use  . Vaping Use: Never used  Substance and Sexual Activity  . Alcohol use: Yes    Alcohol/week: 0.0 standard drinks    Comment: twice a month  . Drug use: No  . Sexual activity: Not Currently  Other Topics Concern  . Not on file  Social History Narrative  . Not on file   Social Determinants of Health   Financial Resource Strain: Not on file  Food Insecurity: Not on file   Transportation Needs: Not on file  Physical Activity: Not on file  Stress: Not on file  Social Connections: Not on file     Observations/Objective: Awake, alert and oriented x 3   Review of Systems  Constitutional: Negative for fever, malaise/fatigue and weight loss.  HENT: Negative.  Negative for nosebleeds.   Eyes: Positive for blurred vision. Negative for double vision and photophobia.  Respiratory: Negative.  Negative for cough and shortness of breath.   Cardiovascular: Negative.  Negative for chest pain, palpitations and leg swelling.  Gastrointestinal: Negative.  Negative for heartburn, nausea and vomiting.  Musculoskeletal: Negative.  Negative for myalgias.  Neurological: Negative.  Negative for dizziness, focal weakness, seizures and headaches.  Psychiatric/Behavioral: Negative for suicidal ideas. The patient is nervous/anxious.     Assessment and Plan: Jeffrey Mullen was seen today for hypertension.  Diagnoses and all orders for this visit:  Essential hypertension -     amLODipine (NORVASC) 10 MG tablet; Take 1 tablet (10 mg total) by mouth daily. -     lisinopril-hydrochlorothiazide (ZESTORETIC) 20-25 MG tablet; Take 1 tablet by mouth daily. Continue all antihypertensives as prescribed.  Remember to bring in your blood pressure log with you for your follow up appointment.  DASH/Mediterranean Diets are healthier choices for HTN.    Anxiety -     busPIRone (BUSPAR) 15 MG tablet; Take 1 tablet (15 mg total) by mouth 3 (three) times daily.     Follow Up Instructions Return in about 2 months (around 05/06/2021).     I discussed the assessment and treatment plan with the patient. The patient was provided an opportunity to ask questions and all were answered. The patient agreed with the plan and demonstrated an understanding of the instructions.   The patient was advised to call back or seek an in-person evaluation if the symptoms worsen or if the condition fails to improve  as anticipated.  I provided 14 minutes of non-face-to-face time during this encounter including median intraservice time, reviewing previous notes, labs, imaging, medications and explaining diagnosis and management.  [name], FNP-BC

## 2021-03-14 ENCOUNTER — Telehealth: Payer: Self-pay | Admitting: Nurse Practitioner

## 2021-03-14 NOTE — Telephone Encounter (Signed)
Jeffrey Mullen, from [facility], called stating that she had a house call with the pt today. She states that the pts A1C was 7.1. She states that the pt told her he was borderline diabetic. She states that the pt has an eye surgery this month and wanted to let PCP know. Please advise.       [phone]

## 2021-03-15 NOTE — Telephone Encounter (Signed)
He needs a lab appointment please. Thanks

## 2021-03-15 NOTE — Telephone Encounter (Signed)
FYI

## 2021-03-16 NOTE — Telephone Encounter (Signed)
Please contact pt and schedule a lab appt for next week

## 2021-03-20 NOTE — Telephone Encounter (Signed)
Patient called back again today- reminded him that he can get medication- tacrolimus ointment @ [facility] with [facility] coupon for $37.45.  Patient understood.

## 2021-03-23 DIAGNOSIS — H25041 Posterior subcapsular polar age-related cataract, right eye: Secondary | ICD-10-CM | POA: Diagnosis not present

## 2021-03-23 DIAGNOSIS — H25011 Cortical age-related cataract, right eye: Secondary | ICD-10-CM | POA: Diagnosis not present

## 2021-03-23 DIAGNOSIS — H2511 Age-related nuclear cataract, right eye: Secondary | ICD-10-CM | POA: Diagnosis not present

## 2021-03-23 DIAGNOSIS — H25811 Combined forms of age-related cataract, right eye: Secondary | ICD-10-CM | POA: Diagnosis not present

## 2021-04-12 ENCOUNTER — Telehealth: Payer: Self-pay | Admitting: *Deleted

## 2021-04-12 NOTE — Telephone Encounter (Signed)
Patient called back still trying to get tacrolimus covered- reminded patient of our conversation on feb 28 that Prior authorization was done and denied- He can get at [employer] pay for 37 45 through [facility]. Patient understood.

## 2021-05-12 ENCOUNTER — Other Ambulatory Visit: Payer: Self-pay

## 2021-05-12 ENCOUNTER — Other Ambulatory Visit: Payer: Self-pay | Admitting: Nurse Practitioner

## 2021-05-12 ENCOUNTER — Encounter: Payer: Self-pay | Admitting: Nurse Practitioner

## 2021-05-12 ENCOUNTER — Ambulatory Visit: Payer: Medicare Other | Attending: Nurse Practitioner | Admitting: Nurse Practitioner

## 2021-05-12 VITALS — BP 129/62 | HR 89 | Wt 175.6 lb

## 2021-05-12 DIAGNOSIS — R7309 Other abnormal glucose: Secondary | ICD-10-CM | POA: Diagnosis not present

## 2021-05-12 DIAGNOSIS — D239 Other benign neoplasm of skin, unspecified: Secondary | ICD-10-CM | POA: Diagnosis not present

## 2021-05-12 DIAGNOSIS — E785 Hyperlipidemia, unspecified: Secondary | ICD-10-CM

## 2021-05-12 DIAGNOSIS — D649 Anemia, unspecified: Secondary | ICD-10-CM | POA: Diagnosis not present

## 2021-05-12 DIAGNOSIS — H6692 Otitis media, unspecified, left ear: Secondary | ICD-10-CM

## 2021-05-12 DIAGNOSIS — F17200 Nicotine dependence, unspecified, uncomplicated: Secondary | ICD-10-CM

## 2021-05-12 DIAGNOSIS — F419 Anxiety disorder, unspecified: Secondary | ICD-10-CM

## 2021-05-12 DIAGNOSIS — I10 Essential (primary) hypertension: Secondary | ICD-10-CM | POA: Diagnosis not present

## 2021-05-12 DIAGNOSIS — F17210 Nicotine dependence, cigarettes, uncomplicated: Secondary | ICD-10-CM

## 2021-05-12 DIAGNOSIS — G8929 Other chronic pain: Secondary | ICD-10-CM

## 2021-05-12 MED ORDER — SAYMAN LANOLIN EX BAR: CHEWABLE_BAR | CUTANEOUS | 99 refills | Status: DC

## 2021-05-12 MED ORDER — NICOTINE 21 MG/24HR TD PT24: 21.0000 mg | MEDICATED_PATCH | Freq: Every day | TRANSDERMAL | 0 refills | Status: AC

## 2021-05-12 MED ORDER — LORATADINE 10 MG PO TABS: 10.0000 mg | ORAL_TABLET | Freq: Every day | ORAL | 1 refills | Status: DC

## 2021-05-12 MED ORDER — NEOMYCIN-POLYMYXIN-HC 1 % OT SOLN
3.0000 [drp] | Freq: Four times a day (QID) | OTIC | 0 refills | Status: DC
Start: 2021-05-12 — End: 2021-09-26

## 2021-05-12 NOTE — Telephone Encounter (Signed)
Requested medication (s) are due for refill today:   Requested medication (s) are on the active medication list: No  Last refill:  03/06/21  Future visit scheduled:Yes  Notes to clinic:  Medication not on list.    Requested Prescriptions  Pending Prescriptions Disp Refills   busPIRone (BUSPAR) 15 MG tablet [Pharmacy Med Name: BUSPIRONE HYDROCHLOR 15MG  TAB] 90 tablet 0    Sig: TAKE ONE TABLET BY MOUTH THREE TIMES A DAY      Psychiatry: Anxiolytics/Hypnotics - Non-controlled Passed - 05/12/2021  9:24 AM      Passed - Valid encounter within last 6 months    Recent Outpatient Visits           Today Benign essential HTN   [place], Vernia Buff, NP   2 months ago Essential hypertension   [place], Vernia Buff, NP   5 months ago [place] Jodi Marble, MD   5 months ago Essential hypertension   [place], Vernia Buff, NP   7 months ago Essential hypertension   [place], Zelda W, NP       Future Appointments             In 1 month Daisy Blossom, Jarome Matin, [place]   In 4 months [name], NP [place]   In 9 months Lavonna Monarch, MD [facility]              Signed Prescriptions Disp Refills   ibuprofen (ADVIL) 800 MG tablet 60 tablet 0    Sig: TAKE 1 TABLET (800 MG TOTAL) BY MOUTH EVERY EIGHT (EIGHT) HOURS AS NEEDED.      Analgesics:  NSAIDS Passed - 05/12/2021  9:24 AM      Passed - Cr in normal range and within 360 days    Creat  Date Value Ref Range Status  12/09/2014 1.01 0.50 - 1.35 mg/dL Final   Creatinine, Ser  Date Value Ref Range Status  12/07/2020 1.10 0.76 - 1.27 mg/dL Final          Passed - HGB in normal range and within 360 days     Hemoglobin  Date Value Ref Range Status  06/20/2020 13.8 13.0 - 17.7 g/dL Final          Passed - Patient is not pregnant      Passed - Valid encounter within last 12 months    Recent Outpatient Visits           Today Benign essential HTN   [place], Vernia Buff, NP   2 months ago Essential hypertension   [place], Vernia Buff, NP   5 months ago [place] Jodi Marble, MD   5 months ago Essential hypertension   [place], Vernia Buff, NP   7 months ago Essential hypertension   [place] [name], NP       Future Appointments             In 1 month Daisy Blossom, Jarome Matin, [place]   In 4 months [name], NP  [place]   In 9 months Lavonna Monarch, MD [facility], [place]

## 2021-05-12 NOTE — Progress Notes (Signed)
Assessment & Plan:  Jeffrey Mullen was seen today for hypertension.  Diagnoses and all orders for this visit:  Benign essential HTN -     CMP14+EGFR Continue all antihypertensives as prescribed.  Remember to bring in your blood pressure log with you for your follow up appointment.  DASH/Mediterranean Diets are healthier choices for HTN.    Dry skin dermatitis -     Discontinue: Soap & Cleansers (SAYMAN LANOLIN) BAR; Apply to affected area -     Soap & Cleansers (SAYMAN LANOLIN) BAR; Apply to affected area  Elevated glucose -     Hemoglobin A1c  Anemia, unspecified type thank you for calling [employer] located at [place]  Dyslipidemia, goal LDL below 100 -     Lipid panel INSTRUCTIONS: Work on a low fat, heart healthy diet and participate in regular aerobic exercise program by working out at least 150 minutes per week; 5 days a week-30 minutes per day. Avoid red meat/beef/steak,  fried foods. junk foods, sodas, sugary drinks, unhealthy snacking, alcohol and smoking.  Drink at least 80 oz of water per day and monitor your carbohydrate intake daily.    Otitis of left ear -     loratadine (CLARITIN) 10 MG tablet; Take 1 tablet (10 mg total) by mouth daily. -     NEOMYCIN-POLYMYXIN-HYDROCORTISONE (CORTISPORIN) 1 % SOLN OTIC solution; Place 3 drops into the left ear 4 (four) times daily.  Tobacco dependence -     nicotine (NICODERM CQ - DOSED IN MG/24 HOURS) 21 mg/24hr patch; Place 1 patch (21 mg total) onto the skin daily. 1. Jeffrey Mullen continues to smoke 1/4 -1/2 cigarettes per day. 2. Jeffrey Mullen was counseled on the dangers of tobacco use, and was advised to quit. We reviewed specific strategies to maximize success, including removing cigarettes and smoking materials from environment, stress management and support of family/friends as well as pharmacological alternatives. 3. A total of 5 minutes was spent on counseling for smoking cessation and  Jeffrey Mullen is ready to quit and has chosen nicotine patches to start today.  4. Jeffrey Mullen was offered Wellbutrin, Chantix, Nicotine patch, Nicotine gum or lozenges.  Due to out of pocket costs Jeffrey Mullen was also given smoking cessation support and advised to contact: the Smoking Cessation hotline: 1-800-QUIT-NOW.  Jeffrey Mullen was also informed of our Smoking cessation classes which are also available through [facility] by calling [phone] or visit our website at [email or web address]  5. Will follow up at next scheduled office visit.     Patient has been counseled on age-appropriate routine health concerns for screening and prevention. These are reviewed and up-to-date. Referrals have been placed accordingly. Immunizations are up-to-date or declined.    Subjective:   Chief Complaint  Patient presents with  . Hypertension   HPI Jeffrey Mullen 70 y.o. male presents to office today for follow up He has a past medical history of Alcohol abuse, Allergy, Anemia, Anxiety, Arthritis, Blind left eye, Cataract, right eye, Congenital blindness, Depression, Glaucoma, bilateral, Hepatic cirrhosis, Hepatitis, HTN, and Seizures.   He is currently seeing Dermatology. Insurance would not pay for tacrolimus. Using [facility] right now to help offset the cost.  DX: Dermatofibroma.  Would like to know if there is any over-the-counter medications he can use in place of tacrolimus due to cost.  I have referred him back to the dermatologist regarding this.  Feels like his equilibrium is off. Started last  week.  Also notes increased irritation, congestion and pressure in the left ear.  Associated symptoms include itchy eyes and sneezing.    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  States he continues to have pain in his right ankle which radiates into the calf and thigh.  Worse after walking about 100 feet.  Wants to apply for disability. Will also speak with vascular regarding if he qualifies for this.    ESSENTIAL HYPERTENSION Blood pressure is well controlled. Taking amlodipine 10 mg daily, lisinopril-hctz 20-25 mg daily. Denies chest pain, shortness of breath, palpitations, lightheadedness, dizziness, headaches or BLE edema.  BP Readings from Last 3 Encounters:  05/12/21 129/62  01/24/21 135/73  12/09/20 121/69    Elevated PSA Urology has suggested prostate biopsy.  He is still considering this.  Wants to know my thoughts regarding the necessity of this.  I have instructed him that if the neurologist feels he needs a prostate biopsy then this is something that he will need to go over in detail with urology.   Elevated Glucose He has a form here from the [place] healthcare representative which shows an A1c of greater than 7.  We will obtain an office A1c and he is aware if elevated will need to start metformin. Review of Systems  Constitutional: Negative for fever, malaise/fatigue and weight loss.  HENT: Positive for ear pain and tinnitus. Negative for nosebleeds.   Eyes: Positive for blurred vision. Negative for double vision and photophobia.  Respiratory: Negative.  Negative for cough and shortness of breath.   Cardiovascular: Positive for claudication. Negative for chest pain, palpitations and leg swelling.  Gastrointestinal: Negative.  Negative for heartburn, nausea and vomiting.  Musculoskeletal: Negative.  Negative for myalgias.  Skin: Positive for itching and rash.  Neurological: Negative.  Negative for dizziness, focal weakness, seizures and headaches.  Psychiatric/Behavioral: Negative.  Negative for suicidal ideas.    Past Medical History:  Diagnosis Date  . Alcohol abuse   . Allergy   . Anemia   . Anxiety   . Arthritis   . Blind left eye   . Cataract, right eye   . Congenital blindness    LEFT EYE  . Depression   . Glaucoma, bilateral   . Hepatic cirrhosis ([place])    . Hepatitis    HEP C treated in 2016  . HTN (hypertension)   . Seizures ([place])    3 years ago    Past Surgical History:  Procedure Laterality Date  . CATARACT EXTRACTION Left   . GLAUCOMA SURGERY     Patient reports 10 or eleven surgeries for Glaucoma    Family History  Problem Relation Age of Onset  . Hearing loss Other   . Kidney disease Neg Hx   . Colon cancer Neg Hx   . Esophageal cancer Neg Hx   . Rectal cancer Neg Hx   . Stomach cancer Neg Hx     Social History Reviewed with no changes to be made today.   Outpatient Medications Prior to Visit  Medication Sig Dispense Refill  . amLODipine (NORVASC) 10 MG tablet Take 1 tablet (10 mg total) by mouth daily. 90 tablet 1  . atorvastatin (LIPITOR) 40 MG tablet Take 1 tablet (40 mg total) by mouth daily. 30 tablet 11  . atropine 1 % ophthalmic solution Place 1 drop into the left eye 2 (two) times daily. 15 mL 2  . bimatoprost (LUMIGAN) 0.01 % SOLN  Place 1 drop into the right eye at bedtime.    . dorzolamide-timolol (COSOPT) 22.3-6.8 MG/ML ophthalmic solution Place 1 drop into the right eye 2 (two) times daily.    Marland Kitchen ibuprofen (ADVIL) 800 MG tablet Take 1 tablet (800 mg total) by mouth every 8 (eight) hours as needed. 60 tablet 1  . lisinopril-hydrochlorothiazide (ZESTORETIC) 20-25 MG tablet Take 1 tablet by mouth daily. 90 tablet 1  . moxifloxacin (VIGAMOX) 0.5 % ophthalmic solution Apply to eye.    . mupirocin nasal ointment (BACTROBAN NASAL) 2 % Place 1 application into the nose 2 (two) times daily. After application, press sides of nose together and gently massage. 10 g 1  . mupirocin ointment (BACTROBAN) 2 % SMARTSIG:1 Application Topical 2-3 Times Daily    . OVER THE COUNTER MEDICATION Vision formula with lutein. One tablet daily.    Marland Kitchen OVER THE COUNTER MEDICATION Glucosamine chondroitin, 1200 mg. One tablet 2 times daily.    . prednisoLONE acetate (PRED FORTE) 1 % ophthalmic suspension SMARTSIG:In Eye(s)    . PROLENSA 0.07  % SOLN Apply to eye.    . tacrolimus (PROTOPIC) 0.1 % ointment Apply to face nightly 60 g 2  . timolol (BETIMOL) 0.25 % ophthalmic solution 1-2 drops 2 (two) times daily.    Marland Kitchen triamcinolone (KENALOG) 0.025 % ointment APPLY ONE APPLICATION TOPICALLY TWICE A DAY 30 g 0  . Soap & Cleansers (SAYMAN LANOLIN) BAR Apply to affected area 1 each prn  . sildenafil (VIAGRA) 100 MG tablet Take 0.5-1 tablets (50-100 mg total) by mouth daily as needed for erectile dysfunction. 10 tablet prn   Facility-Administered Medications Prior to Visit  Medication Dose Route Frequency Provider Last Rate Last Admin  . aspirin EC tablet 81 mg  81 mg Oral Daily Cherre Robins, MD        Allergies  Allergen Reactions  . Acetaminophen Other (See Comments)    Causes eye pressure to elevate; CANNOT HAVE THIS       Objective:    BP 129/62   Pulse 89   Wt 175 lb 9.6 oz (79.7 kg)   SpO2 95%   BMI 24.49 kg/m  Wt Readings from Last 3 Encounters:  05/12/21 175 lb 9.6 oz (79.7 kg)  01/24/21 185 lb (83.9 kg)  12/09/20 181 lb (82.1 kg)    Physical Exam Vitals and nursing note reviewed.  Constitutional:      Appearance: He is well-developed.  HENT:     Head: Normocephalic and atraumatic.  Eyes:     Extraocular Movements:     Left eye: Abnormal extraocular motion present.  Cardiovascular:     Rate and Rhythm: Normal rate and regular rhythm.     Heart sounds: Normal heart sounds. No murmur heard. No friction rub. No gallop.   Pulmonary:     Effort: Pulmonary effort is normal. No tachypnea or respiratory distress.     Breath sounds: Normal breath sounds. No decreased breath sounds, wheezing, rhonchi or rales.  Chest:     Chest wall: No tenderness.  Abdominal:     General: Bowel sounds are normal.     Palpations: Abdomen is soft.  Musculoskeletal:        General: Normal range of motion.     Cervical back: Normal range of motion.  Skin:    General: Skin is warm and dry.  Neurological:     Mental  Status: He is alert and oriented to person, place, and time.     Coordination:  Coordination normal.  Psychiatric:        Behavior: Behavior normal. Behavior is cooperative.        Thought Content: Thought content normal.        Judgment: Judgment normal.          Patient has been counseled extensively about nutrition and exercise as well as the importance of adherence with medications and regular follow-up. The patient was given clear instructions to go to ER or return to medical center if symptoms don't improve, worsen or new problems develop. The patient verbalized understanding.   Follow-up: Return in about 3 months (around 08/12/2021).   [name], FNP-BC [facility] [place], [place]   05/12/2021, 12:53 PM

## 2021-05-12 NOTE — Patient Instructions (Signed)
Try to look for [facility] soaps

## 2021-05-13 LAB — CMP14+EGFR
ALT: 66 IU/L — ABNORMAL HIGH (ref 0–44)
AST: 49 IU/L — ABNORMAL HIGH (ref 0–40)
Albumin/Globulin Ratio: 1.7 (ref 1.2–2.2)
Albumin: 4.9 g/dL — ABNORMAL HIGH (ref 3.8–4.8)
Alkaline Phosphatase: 114 IU/L (ref 44–121)
BUN/Creatinine Ratio: 9 — ABNORMAL LOW (ref 10–24)
BUN: 10 mg/dL (ref 8–27)
Bilirubin Total: 0.2 mg/dL (ref 0.0–1.2)
CO2: 19 mmol/L — ABNORMAL LOW (ref 20–29)
Calcium: 9.5 mg/dL (ref 8.6–10.2)
Chloride: 99 mmol/L (ref 96–106)
Creatinine, Ser: 1.06 mg/dL (ref 0.76–1.27)
Globulin, Total: 2.9 g/dL (ref 1.5–4.5)
Glucose: 77 mg/dL (ref 65–99)
Potassium: 4.1 mmol/L (ref 3.5–5.2)
Sodium: 139 mmol/L (ref 134–144)
Total Protein: 7.8 g/dL (ref 6.0–8.5)
eGFR: 77 mL/min/{1.73_m2} (ref >59)

## 2021-05-13 LAB — LIPID PANEL
Chol/HDL Ratio: 2.2 ratio (ref 0.0–5.0)
Cholesterol, Total: 110 mg/dL (ref 100–199)
HDL: 50 mg/dL (ref >39)
LDL Chol Calc (NIH): 42 mg/dL (ref 0–99)
Triglycerides: 96 mg/dL (ref 0–149)
VLDL Cholesterol Cal: 18 mg/dL (ref 5–40)

## 2021-05-13 LAB — CBC
Hematocrit: 38.7 % (ref 37.5–51.0)
Hemoglobin: 12.9 g/dL — ABNORMAL LOW (ref 13.0–17.7)
MCH: 28.5 pg (ref 26.6–33.0)
MCHC: 33.3 g/dL (ref 31.5–35.7)
MCV: 86 fL (ref 79–97)
Platelets: 295 10*3/uL (ref 150–450)
RBC: 4.52 x10E6/uL (ref 4.14–5.80)
RDW: 15.8 % — ABNORMAL HIGH (ref 11.6–15.4)
WBC: 8.5 10*3/uL (ref 3.4–10.8)

## 2021-05-13 LAB — HEMOGLOBIN A1C
Est. average glucose Bld gHb Est-mCnc: 126 mg/dL
Hgb A1c MFr Bld: 6 % — ABNORMAL HIGH (ref 4.8–5.6)

## 2021-06-02 ENCOUNTER — Other Ambulatory Visit (HOSPITAL_BASED_OUTPATIENT_CLINIC_OR_DEPARTMENT_OTHER): Payer: Self-pay

## 2021-06-02 ENCOUNTER — Ambulatory Visit: Payer: Medicare Other

## 2021-06-12 ENCOUNTER — Ambulatory Visit: Payer: Medicare Other | Admitting: Pharmacist

## 2021-07-13 DIAGNOSIS — Z20822 Contact with and (suspected) exposure to covid-19: Secondary | ICD-10-CM | POA: Diagnosis not present

## 2021-07-14 ENCOUNTER — Other Ambulatory Visit: Payer: Self-pay | Admitting: *Deleted

## 2021-07-14 DIAGNOSIS — I70213 Atherosclerosis of native arteries of extremities with intermittent claudication, bilateral legs: Secondary | ICD-10-CM

## 2021-07-14 DIAGNOSIS — I739 Peripheral vascular disease, unspecified: Secondary | ICD-10-CM

## 2021-07-17 DIAGNOSIS — Z20822 Contact with and (suspected) exposure to covid-19: Secondary | ICD-10-CM | POA: Diagnosis not present

## 2021-07-20 DIAGNOSIS — Z20822 Contact with and (suspected) exposure to covid-19: Secondary | ICD-10-CM | POA: Diagnosis not present

## 2021-07-24 NOTE — Progress Notes (Deleted)
ASSESSMENT & PLAN:  70 y.o. 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  Recommend the following which can slow the progression of atherosclerosis and reduce the risk of major adverse cardiac / limb events:  Complete cessation from all tobacco products. Blood glucose control with goal A1c < 7%. Blood pressure control with goal blood pressure < 140/90 mmHg. Lipid reduction therapy with goal LDL-C <100 mg/dL (<70 if symptomatic from PAD).  Adequate hydration (at least 2 liters / day) if patient's heart and kidney function is adequate. Aspirin '81mg'$  PO QD.  Exercise regimen for intermittent claudication. Atorvastatin 40-'80mg'$  PO QD (or other "high intensity" statin therapy).  Follow up in 6 months with [facility] PA for PAD surveillance.   CHIEF COMPLAINT:   Bilateral calf cramping  HISTORY:  HISTORY OF PRESENT ILLNESS: KEDARRIUS LIKE is a 70 y.o. male referred to clinic for evaluation of bilateral discomfort in his legs when ambulating.  His symptoms are fairly classic for intermittent claudication.  He reports that 2 blocks he gets burning discomfort in his calves.  If he continues to walk this will progress to his thighs.  He also endorses erectile dysfunction.  He is a long-term smoker and is trying to cut down.  He is smoking 4 cigarettes a day.  Mostly concerned about his mobility deteriorating and requiring a wheelchair or other assistive device.  Past Medical History:  Diagnosis Date   Alcohol abuse    Allergy    Anemia    Anxiety    Arthritis    Blind left eye    Cataract, right eye    Congenital blindness    LEFT EYE   Depression    Glaucoma, bilateral    Hepatic cirrhosis ([place])    Hepatitis    HEP C treated in 2016   HTN (hypertension)    Seizures ([place])    3 years ago    Past Surgical History:  Procedure Laterality Date   CATARACT EXTRACTION Left    GLAUCOMA SURGERY     Patient reports 71 or  eleven surgeries for Glaucoma    Family History  Problem Relation Age of Onset   Hearing loss Other    Kidney disease Neg Hx    Colon cancer Neg Hx    Esophageal cancer Neg Hx    Rectal cancer Neg Hx    Stomach cancer Neg Hx     Social History   Socioeconomic History   Marital status: Single    Spouse name: Not on file   Number of children: Not on file   Years of education: Not on file   Highest education level: Not on file  Occupational History   Not on file  Tobacco Use   Smoking status: Some Days    Packs/day: 0.25    Types: Cigarettes   Smokeless tobacco: Never  Vaping Use   Vaping Use: Never used  Substance and Sexual Activity   Alcohol use: Yes    Alcohol/week: 0.0 standard drinks    Comment: twice a month   Drug use: No   Sexual activity: Not Currently  Other Topics Concern   Not on file  Social History Narrative   Not on file   Social Determinants of Health   Financial Resource Strain: Not on file  Food Insecurity: Not on file  Transportation Needs: Not on file  Physical Activity: Not on file  Stress: Not on file  Social Connections: Not on file  Intimate Partner Violence: Not on file    Allergies  Allergen Reactions   Acetaminophen Other (See Comments)    Causes eye pressure to elevate; CANNOT HAVE THIS    Current Outpatient Medications  Medication Sig Dispense Refill   amLODipine (NORVASC) 10 MG tablet Take 1 tablet (10 mg total) by mouth daily. 90 tablet 1   atorvastatin (LIPITOR) 40 MG tablet Take 1 tablet (40 mg total) by mouth daily. 30 tablet 11   atropine 1 % ophthalmic solution Place 1 drop into the left eye 2 (two) times daily. 15 mL 2   bimatoprost (LUMIGAN) 0.01 % SOLN Place 1 drop into the right eye at bedtime.     busPIRone (BUSPAR) 15 MG tablet TAKE ONE TABLET BY MOUTH THREE TIMES A DAY 90 tablet 0   dorzolamide-timolol (COSOPT) 22.3-6.8 MG/ML ophthalmic solution Place 1 drop into the right eye 2 (two) times daily.     ibuprofen  (ADVIL) 800 MG tablet TAKE 1 TABLET (800 MG TOTAL) BY MOUTH EVERY EIGHT (EIGHT) HOURS AS NEEDED. 60 tablet 0   lisinopril-hydrochlorothiazide (ZESTORETIC) 20-25 MG tablet Take 1 tablet by mouth daily. 90 tablet 1   loratadine (CLARITIN) 10 MG tablet Take 1 tablet (10 mg total) by mouth daily. 90 tablet 1   moxifloxacin (VIGAMOX) 0.5 % ophthalmic solution Apply to eye.     mupirocin nasal ointment (BACTROBAN NASAL) 2 % Place 1 application into the nose 2 (two) times daily. After application, press sides of nose together and gently massage. 10 g 1   mupirocin ointment (BACTROBAN) 2 % SMARTSIG:1 Application Topical 2-3 Times Daily     NEOMYCIN-POLYMYXIN-HYDROCORTISONE (CORTISPORIN) 1 % SOLN OTIC solution Place 3 drops into the left ear 4 (four) times daily. 10 mL 0   OVER THE COUNTER MEDICATION Vision formula with lutein. One tablet daily.     OVER THE COUNTER MEDICATION Glucosamine chondroitin, 1200 mg. One tablet 2 times daily.     prednisoLONE acetate (PRED FORTE) 1 % ophthalmic suspension SMARTSIG:In Eye(s)     PROLENSA 0.07 % SOLN Apply to eye.     sildenafil (VIAGRA) 100 MG tablet Take 0.5-1 tablets (50-100 mg total) by mouth daily as needed for erectile dysfunction. 10 tablet prn   Soap & Cleansers (SAYMAN LANOLIN) BAR Apply to affected area 1 each prn   tacrolimus (PROTOPIC) 0.1 % ointment Apply to face nightly 60 g 2   timolol (BETIMOL) 0.25 % ophthalmic solution 1-2 drops 2 (two) times daily.     triamcinolone (KENALOG) 0.025 % ointment APPLY ONE APPLICATION TOPICALLY TWICE A DAY 30 g 0   Current Facility-Administered Medications  Medication Dose Route Frequency Provider Last Rate Last Admin   aspirin EC tablet 81 mg  81 mg Oral Daily Cherre Robins, MD        REVIEW OF SYSTEMS:  '[X]'$  denotes positive finding, '[ ]'$  denotes negative finding Cardiac  Comments:  Chest pain or chest pressure:    Shortness of breath upon exertion:    Short of breath when lying flat:    Irregular heart  rhythm:        Vascular    Pain in calf, thigh, or hip brought on by ambulation: x   Pain in feet at night that wakes you up from your sleep:     Blood clot in your veins:    Leg swelling:         Pulmonary    Oxygen at home:    Productive cough:  Wheezing:         Neurologic    Sudden weakness in arms or legs:     Sudden numbness in arms or legs:     Sudden onset of difficulty speaking or slurred speech:    Temporary loss of vision in one eye:     Problems with dizziness:         Gastrointestinal    Blood in stool:     Vomited blood:         Genitourinary    Burning when urinating:     Blood in urine:        Psychiatric    Major depression:         Hematologic    Bleeding problems:    Problems with blood clotting too easily:        Skin    Rashes or ulcers:        Constitutional    Fever or chills:     PHYSICAL EXAM:   There were no vitals filed for this visit.  Constitutional: well appearing in no distress. Appears well nourished.  Neurologic: CN intact. No focal findings. No sensory loss. Psychiatric: Mood and affect symmetric and appropriate. Eyes: No icterus. No conjunctival pallor. Ears, nose, throat: mucous membranes moist. Midline trachea.  Cardiac: regular rate and rhythm.  Respiratory: unlabored. Abdominal: soft, non-tender, non-distended.  Peripheral vascular:  1+ femoral pulses bilaterally  No palpable pedal pulses Extremity: No edema. No cyanosis. No pallor.  Skin: No gangrene. No ulceration.  Lymphatic: No Stemmer's sign. No palpable lymphadenopathy.    DATA REVIEW:    Most recent CBC CBC Latest Ref Rng & Units 05/12/2021 06/20/2020 05/06/2019  WBC 3.4 - 10.8 x10E3/uL 8.5 4.6 4.9  Hemoglobin 13.0 - 17.7 g/dL 12.9(L) 13.8 10.0(L)  Hematocrit 37.5 - 51.0 % 38.7 40.9 29.8(L)  Platelets 150 - 450 x10E3/uL 295 234 186     Most recent CMP CMP Latest Ref Rng & Units 05/12/2021 12/07/2020 09/22/2020  Glucose 65 - 99 mg/dL 77 95 102(H)  BUN  8 - 27 mg/dL 10 10 3(L)  Creatinine 0.76 - 1.27 mg/dL 1.06 1.10 0.98  Sodium 134 - 144 mmol/L 139 142 142  Potassium 3.5 - 5.2 mmol/L 4.1 4.4 4.1  Chloride 96 - 106 mmol/L 99 101 100  CO2 20 - 29 mmol/L 19(L) 25 27  Calcium 8.6 - 10.2 mg/dL 9.5 9.5 9.8  Total Protein 6.0 - 8.5 g/dL 7.8 - 7.5  Total Bilirubin 0.0 - 1.2 mg/dL 0.2 - <0.2  Alkaline Phos 44 - 121 IU/L 114 - 105  AST 0 - 40 IU/L 49(H) - 35  ALT 0 - 44 IU/L 66(H) - 57(H)    Renal function CrCl cannot be calculated (Patient's most recent lab result is older than the maximum 21 days allowed.).  Hgb A1c MFr Bld (%)  Date Value  05/12/2021 6.0 (H)    LDL Chol Calc (NIH)  Date Value Ref Range Status  05/12/2021 42 0 - 99 mg/dL Final     Vascular Imaging: ABI 01/24/21    Yevonne Aline. Stanford Breed, MD [facility] Phone Number: [phone] 07/24/2021 12:05 PM

## 2021-07-25 ENCOUNTER — Ambulatory Visit: Payer: Medicare Other | Admitting: Vascular Surgery

## 2021-07-25 ENCOUNTER — Encounter (HOSPITAL_COMMUNITY): Payer: Medicare Other

## 2021-07-26 DIAGNOSIS — Q150 Congenital glaucoma: Secondary | ICD-10-CM | POA: Diagnosis not present

## 2021-07-26 DIAGNOSIS — H1789 Other corneal scars and opacities: Secondary | ICD-10-CM | POA: Diagnosis not present

## 2021-07-26 DIAGNOSIS — H18422 Band keratopathy, left eye: Secondary | ICD-10-CM | POA: Diagnosis not present

## 2021-08-04 DIAGNOSIS — Z1152 Encounter for screening for COVID-19: Secondary | ICD-10-CM | POA: Diagnosis not present

## 2021-08-16 DIAGNOSIS — Z1152 Encounter for screening for COVID-19: Secondary | ICD-10-CM | POA: Diagnosis not present

## 2021-08-20 ENCOUNTER — Telehealth: Payer: Self-pay

## 2021-08-20 NOTE — Telephone Encounter (Signed)
Called pt to schedule AWV, no answer, mailbox full.

## 2021-09-03 NOTE — Telephone Encounter (Signed)
Tried again, no answer, mailbox full.

## 2021-09-12 ENCOUNTER — Ambulatory Visit: Payer: Medicare Other | Admitting: Nurse Practitioner

## 2021-09-20 DIAGNOSIS — Z1152 Encounter for screening for COVID-19: Secondary | ICD-10-CM | POA: Diagnosis not present

## 2021-09-21 ENCOUNTER — Inpatient Hospital Stay (HOSPITAL_COMMUNITY): Admission: RE | Admit: 2021-09-21 | Payer: Medicare Other | Source: Ambulatory Visit

## 2021-09-26 ENCOUNTER — Emergency Department (HOSPITAL_COMMUNITY): Payer: Medicare Other

## 2021-09-26 ENCOUNTER — Other Ambulatory Visit: Payer: Self-pay

## 2021-09-26 ENCOUNTER — Encounter (HOSPITAL_COMMUNITY): Payer: Self-pay | Admitting: Emergency Medicine

## 2021-09-26 ENCOUNTER — Emergency Department (HOSPITAL_COMMUNITY)
Admission: EM | Admit: 2021-09-26 | Discharge: 2021-09-26 | Disposition: A | Discharge status: Home / Self Care | Payer: Medicare Other | Attending: Emergency Medicine | Admitting: Emergency Medicine

## 2021-09-26 DIAGNOSIS — R404 Transient alteration of awareness: Secondary | ICD-10-CM | POA: Diagnosis not present

## 2021-09-26 DIAGNOSIS — I10 Essential (primary) hypertension: Secondary | ICD-10-CM | POA: Insufficient documentation

## 2021-09-26 DIAGNOSIS — M542 Cervicalgia: Secondary | ICD-10-CM | POA: Insufficient documentation

## 2021-09-26 DIAGNOSIS — Y92242 Post office as the place of occurrence of the external cause: Secondary | ICD-10-CM | POA: Diagnosis not present

## 2021-09-26 DIAGNOSIS — R55 Syncope and collapse: Secondary | ICD-10-CM | POA: Diagnosis not present

## 2021-09-26 DIAGNOSIS — F17210 Nicotine dependence, cigarettes, uncomplicated: Secondary | ICD-10-CM | POA: Diagnosis not present

## 2021-09-26 DIAGNOSIS — S0990XA Unspecified injury of head, initial encounter: Secondary | ICD-10-CM

## 2021-09-26 DIAGNOSIS — E860 Dehydration: Secondary | ICD-10-CM

## 2021-09-26 DIAGNOSIS — W228XXA Striking against or struck by other objects, initial encounter: Secondary | ICD-10-CM | POA: Insufficient documentation

## 2021-09-26 DIAGNOSIS — T675XXA Heat exhaustion, unspecified, initial encounter: Secondary | ICD-10-CM | POA: Diagnosis not present

## 2021-09-26 DIAGNOSIS — Z743 Need for continuous supervision: Secondary | ICD-10-CM | POA: Diagnosis not present

## 2021-09-26 DIAGNOSIS — Z79899 Other long term (current) drug therapy: Secondary | ICD-10-CM | POA: Insufficient documentation

## 2021-09-26 DIAGNOSIS — R531 Weakness: Secondary | ICD-10-CM | POA: Diagnosis not present

## 2021-09-26 LAB — BASIC METABOLIC PANEL
Anion gap: 19 — ABNORMAL HIGH (ref 5–15)
BUN: 10 mg/dL (ref 8–23)
CO2: 23 mmol/L (ref 22–32)
Calcium: 10.1 mg/dL (ref 8.9–10.3)
Chloride: 103 mmol/L (ref 98–111)
Creatinine, Ser: 0.9 mg/dL (ref 0.61–1.24)
GFR, Estimated: >60 mL/min (ref >60)
Glucose, Bld: 87 mg/dL (ref 70–99)
Potassium: 3.6 mmol/L (ref 3.5–5.1)
Sodium: 145 mmol/L (ref 135–145)

## 2021-09-26 LAB — CBC
HCT: 41.5 % (ref 39.0–52.0)
Hemoglobin: 13.8 g/dL (ref 13.0–17.0)
MCH: 27.9 pg (ref 26.0–34.0)
MCHC: 33.3 g/dL (ref 30.0–36.0)
MCV: 83.8 fL (ref 80.0–100.0)
Platelets: 252 10*3/uL (ref 150–400)
RBC: 4.95 MIL/uL (ref 4.22–5.81)
RDW: 15.3 % (ref 11.5–15.5)
WBC: 6.4 10*3/uL (ref 4.0–10.5)
nRBC: 0 % (ref 0.0–0.2)

## 2021-09-26 LAB — CBG MONITORING, ED: Glucose-Capillary: 95 mg/dL (ref 70–99)

## 2021-09-26 LAB — URINALYSIS, ROUTINE W REFLEX MICROSCOPIC
Bilirubin Urine: NEGATIVE
Glucose, UA: NEGATIVE mg/dL
Hgb urine dipstick: NEGATIVE
Ketones, ur: 20 mg/dL — AB
Leukocytes,Ua: NEGATIVE
Nitrite: NEGATIVE
Protein, ur: NEGATIVE mg/dL
Specific Gravity, Urine: 1.008 (ref 1.005–1.030)
pH: 5 (ref 5.0–8.0)

## 2021-09-26 MED ORDER — SODIUM CHLORIDE 0.9 % IV BOLUS
1000.0000 mL | Freq: Once | INTRAVENOUS | Status: AC
  Administered 2021-09-26: 1000 mL via INTRAVENOUS

## 2021-09-26 MED ORDER — FENTANYL CITRATE PF 50 MCG/ML IJ SOSY
50.0000 ug | PREFILLED_SYRINGE | Freq: Once | INTRAMUSCULAR | Status: AC
  Administered 2021-09-26: 50 ug via INTRAVENOUS
  Filled 2021-09-26: qty 1

## 2021-09-26 NOTE — ED Provider Notes (Signed)
[facility] DEPT Provider Note   CSN: [id number] Arrival date & time: 09/26/21  0941     History Chief Complaint  Patient presents with   Loss of Consciousness    SCOTT VANDERVEER is a 70 y.o. male.  HPI     70 year old male comes in with chief complaint of loss of consciousness.  Patient was at a post office and passed out.  He reports that when he walked into the post office it was really hot, and he felt like he was going to faint and nearly fainted the first time.  He struck his head in the process.  Thereafter he got up and actually fainted.  He reports not having anything to eat or drink today but does later on stated that he had 1 beer.  Past history significant for alcohol abuse.  Patient reports having headache and neck pain.  He denies any new numbness, tingling, weakness, chest pain, shortness of breath, back pain.  Past Medical History:  Diagnosis Date   Alcohol abuse    Allergy    Anemia    Anxiety    Arthritis    Blind left eye    Cataract, right eye    Congenital blindness    LEFT EYE   Depression    Glaucoma, bilateral    Hepatic cirrhosis ([place])    Hepatitis    HEP C treated in 2016   HTN (hypertension)    Seizures ([place])    3 years ago    Patient Active Problem List   Diagnosis Date Noted   Cataract Right eye 12/12/2018   Glaucoma, congenital, blind Left eye  12/12/2018   Lives in homeless shelter 12/12/2018   Shoulder pain, right 12/12/2018   Tobacco abuse    ETOH abuse    Benign essential HTN    Hepatic cirrhosis ([place]) 02/10/2015   Chronic hepatitis C without hepatic coma ([place]) 01/05/2015    Past Surgical History:  Procedure Laterality Date   CATARACT EXTRACTION Left    GLAUCOMA SURGERY     Patient reports 74 or eleven surgeries for Glaucoma       Family History  Problem Relation Age of Onset   Hearing loss Other    Kidney disease Neg Hx    Colon cancer Neg Hx    Esophageal cancer Neg Hx     Rectal cancer Neg Hx    Stomach cancer Neg Hx     Social History   Tobacco Use   Smoking status: Some Days    Packs/day: 0.25    Types: Cigarettes   Smokeless tobacco: Never  Vaping Use   Vaping Use: Never used  Substance Use Topics   Alcohol use: Yes    Alcohol/week: 5.0 standard drinks    Types: 5 Cans of beer per week    Comment: twice a month   Drug use: No    Home Medications Prior to Admission medications   Medication Sig Start Date End Date Taking? Authorizing Provider  amLODipine (NORVASC) 10 MG tablet Take 1 tablet (10 mg total) by mouth daily. 03/06/21  Yes [name], NP  atorvastatin (LIPITOR) 40 MG tablet Take 1 tablet (40 mg total) by mouth daily. 01/24/21 01/24/22 Yes Cherre Robins, MD  atropine 1 % ophthalmic solution Place 1 drop into the left eye 2 (two) times daily. 03/25/19  Yes Elsie Stain, MD  bimatoprost (LUMIGAN) 0.01 % SOLN Place 1 drop into the right eye at bedtime.  Yes [provider]  busPIRone (BUSPAR) 15 MG tablet TAKE ONE TABLET BY MOUTH THREE TIMES A DAY Patient taking differently: Take 15 mg by mouth 2 (two) times daily as needed (anxiety). 05/19/21  Yes [name], NP  Cholecalciferol (VITAMIN D) 50 MCG (2000 UT) CAPS Take 2,000 Units by mouth daily.   Yes [provider]  dorzolamide-timolol (COSOPT) 22.3-6.8 MG/ML ophthalmic solution Place 1 drop into the right eye 2 (two) times daily.   Yes [provider]  ibuprofen (ADVIL) 800 MG tablet TAKE 1 TABLET (800 MG TOTAL) BY MOUTH EVERY EIGHT (EIGHT) HOURS AS NEEDED. Patient taking differently: Take 800 mg by mouth every 8 (eight) hours as needed for moderate pain. 05/12/21  Yes [name], NP  lisinopril-hydrochlorothiazide (ZESTORETIC) 20-25 MG tablet Take 1 tablet by mouth daily. 03/06/21  Yes [name], NP  loratadine (CLARITIN) 10 MG tablet Take 1 tablet (10 mg total) by mouth daily. Patient taking differently: Take 10 mg by mouth daily as  needed for allergies. 05/12/21 09/26/21 Yes [name], NP  mupirocin ointment (BACTROBAN) 2 % Apply 1 application topically 2 (two) times daily as needed (affected area(s)). 11/23/20  Yes [provider]  OVER THE COUNTER MEDICATION Take 1 tablet by mouth daily. Vision formula with lutein.   Yes [provider]  OVER THE COUNTER MEDICATION Take 1,200 mg by mouth 2 (two) times daily. Glucosamine chondroitin   Yes [provider]  triamcinolone (KENALOG) 0.025 % ointment APPLY ONE APPLICATION TOPICALLY TWICE A DAY Patient taking differently: Apply 1 application topically 2 (two) times daily as needed (rash). 09/29/20  Yes [name], NP  vitamin C (ASCORBIC ACID) 500 MG tablet Take 500 mg by mouth daily.   Yes [provider]  sildenafil (VIAGRA) 100 MG tablet Take 0.5-1 tablets (50-100 mg total) by mouth daily as needed for erectile dysfunction. 06/20/20 07/20/20  [name], NP  [place] [facility] Phineas Inches) Hal Neer Apply to affected area 05/12/21   [name], NP  tacrolimus (PROTOPIC) 0.1 % ointment Apply to face nightly 02/16/21   Lavonna Monarch, MD    Allergies    Acetaminophen  Review of Systems   Review of Systems  Constitutional:  Positive for activity change.  Eyes:  Negative for visual disturbance.  Respiratory:  Negative for shortness of breath.   Cardiovascular:  Negative for chest pain.  Gastrointestinal:  Negative for nausea and vomiting.  Neurological:  Positive for syncope and headaches. Negative for facial asymmetry, weakness, light-headedness and numbness.  Hematological:  Does not bruise/bleed easily.  All other systems reviewed and are negative.  Physical Exam Updated Vital Signs BP (!) 149/76   Pulse 77   Temp 98.1 F (36.7 C) (Oral)   Resp 11   Ht 5\' 11"  (1.803 m)   Wt 81.6 kg   SpO2 96%   BMI 25.10 kg/m   Physical Exam Vitals and nursing note reviewed.  Constitutional:      Appearance: He is  well-developed.  HENT:     Head: Atraumatic.  Eyes:     Comments: Left eye at baseline abnormal  Neck:     Comments: In a c-collar, C-spine tenderness present over the midline. Cardiovascular:     Rate and Rhythm: Normal rate.  Pulmonary:     Effort: Pulmonary effort is normal.     Breath sounds: Normal breath sounds.  Musculoskeletal:     Comments: Head to toe evaluation shows no hematoma,  bleeding of the scalp, no facial abrasions, no spine step offs, crepitus of the chest or neck, no tenderness to palpation of the bilateral upper and lower extremities, no gross deformities, no chest tenderness, no pelvic pain.   Skin:    General: Skin is warm.  Neurological:     Mental Status: He is alert and oriented to person, place, and time.    ED Results / Procedures / Treatments   Labs (all labs ordered are listed, but only abnormal results are displayed) Labs Reviewed  BASIC METABOLIC PANEL - Abnormal; Notable for the following components:      Result Value   Anion gap 19 (*)    All other components within normal limits  CBC  URINALYSIS, ROUTINE W REFLEX MICROSCOPIC  CBG MONITORING, ED    EKG None  Radiology CT Head Wo Contrast  Result Date: 09/26/2021 CLINICAL DATA:  Head trauma, mod-severe; Polytrauma, critical, head/C-spine injury suspected. Syncope. Neck pain. EXAM: CT HEAD WITHOUT CONTRAST CT CERVICAL SPINE WITHOUT CONTRAST TECHNIQUE: Multidetector CT imaging of the head and cervical spine was performed following the standard protocol without intravenous contrast. Multiplanar CT image reconstructions of the cervical spine were also generated. COMPARISON:  11/01/2018 FINDINGS: CT HEAD FINDINGS Brain: There is no evidence of an acute infarct, intracranial hemorrhage, mass, midline shift, or extra-axial fluid collection. Encephalomalacia inferiorly in the left greater than right frontal lobes and in the posterior right occipital lobe is unchanged and consistent with the sequelae of  remote trauma. Hypodensities elsewhere in the cerebral white matter bilaterally are unchanged and nonspecific but compatible with moderate chronic small vessel ischemic disease. The ventricles are normal in size. Vascular: Calcified atherosclerosis at the skull base. No hyperdense vessel. Skull: No acute fracture or suspicious osseous lesion. Partially visualized remote maxillofacial fractures. Sinuses/Orbits: Visualized paranasal sinuses and mastoid air cells are clear. Bilateral cataract extraction. Chronically enlarged left globe with history of glaucoma. Other: None. CT CERVICAL SPINE FINDINGS Alignment: Straightening of the normal cervical lordosis. No listhesis. Skull base and vertebrae: No acute fracture or suspicious osseous lesion. Ligamentum flavum ossification most notable on the right at C3-4. Prominent median C1-2 arthropathy with unchanged noncompressive partially calcified ligamentous thickening about the dens. Soft tissues and spinal canal: No prevertebral fluid or swelling. No visible canal hematoma. Disc levels: Moderate cervical disc and facet degeneration with disc bulging, uncovertebral spurring, and facet spurring resulting in severe neural foraminal stenosis on the left at C3-4 and bilaterally at C6-7. Upper chest: Clear lung apices. Other: Moderate calcific atherosclerosis at the carotid bifurcations. IMPRESSION: 1. No evidence of acute intracranial abnormality. 2. 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 3. Moderate chronic small vessel ischemic disease. 4. No acute cervical spine fracture. Electronically Signed   By: Logan Bores M.D.   On: 09/26/2021 12:57   CT Cervical Spine Wo Contrast  Result Date: 09/26/2021 CLINICAL DATA:  Head trauma, mod-severe; Polytrauma, critical, head/C-spine injury suspected. Syncope. Neck pain. EXAM: CT HEAD WITHOUT CONTRAST CT CERVICAL SPINE WITHOUT CONTRAST TECHNIQUE: Multidetector CT imaging of the head and  cervical spine was performed following the standard protocol without intravenous contrast. Multiplanar CT image reconstructions of the cervical spine were also generated. COMPARISON:  11/01/2018 FINDINGS: CT HEAD FINDINGS Brain: There is no evidence of an acute infarct, intracranial hemorrhage, mass, midline shift, or extra-axial fluid collection. Encephalomalacia inferiorly in the left greater than right frontal lobes and in the posterior right occipital lobe is unchanged and consistent with the sequelae of remote  trauma. Hypodensities elsewhere in the cerebral white matter bilaterally are unchanged and nonspecific but compatible with moderate chronic small vessel ischemic disease. The ventricles are normal in size. Vascular: Calcified atherosclerosis at the skull base. No hyperdense vessel. Skull: No acute fracture or suspicious osseous lesion. Partially visualized remote maxillofacial fractures. Sinuses/Orbits: Visualized paranasal sinuses and mastoid air cells are clear. Bilateral cataract extraction. Chronically enlarged left globe with history of glaucoma. Other: None. CT CERVICAL SPINE FINDINGS Alignment: Straightening of the normal cervical lordosis. No listhesis. Skull base and vertebrae: No acute fracture or suspicious osseous lesion. Ligamentum flavum ossification most notable on the right at C3-4. Prominent median C1-2 arthropathy with unchanged noncompressive partially calcified ligamentous thickening about the dens. Soft tissues and spinal canal: No prevertebral fluid or swelling. No visible canal hematoma. Disc levels: Moderate cervical disc and facet degeneration with disc bulging, uncovertebral spurring, and facet spurring resulting in severe neural foraminal stenosis on the left at C3-4 and bilaterally at C6-7. Upper chest: Clear lung apices. Other: Moderate calcific atherosclerosis at the carotid bifurcations. IMPRESSION: 1. No evidence of acute intracranial abnormality. 2. Unchanged posttraumatic  encephalomalacia in the frontal lobes and right occipital lobe. 3. Moderate chronic small vessel ischemic disease. 4. No acute cervical spine fracture. Electronically Signed   By: Logan Bores M.D.   On: 09/26/2021 12:57    Procedures Procedures   Medications Ordered in ED Medications  fentaNYL (SUBLIMAZE) injection 50 mcg (50 mcg Intravenous Given 09/26/21 1140)  sodium chloride 0.9 % bolus 1,000 mL (0 mLs Intravenous Stopped 09/26/21 1328)    ED Course  I have reviewed the triage vital signs and the nursing notes.  Pertinent labs & imaging results that were available during my care of the patient were reviewed by me and considered in my medical decision making (see chart for details).  Clinical Course as of 09/26/21 1455  Tue Sep 26, 2021  1454 C-spine has been cleared. CT C-spine reviewed independently by me along with CT of the brain.  Patient now admits to drinking earlier today. Patient is clinically sober. He is talking coherently, gait is normal, and is demonstrating rational thought process. We shall discharge him shortly, and we have discussed the warning signs of alcohol withdrawal with him verbally, and the information will be provided with the discharge instructions as well.   [AN]    Clinical Course User Index [AN] Varney Biles, MD   MDM Rules/Calculators/A&P                           70 year old comes with a chief complaint of questionable syncope.  [place] post office and reports having a near fainting spell followed by a fainting spell.  He struck his head and is having headache and neck pain.  There is also history of alcohol abuse and patient admits to may be having a beer today.  Otherwise is musculoskeletal survey is reassuring.  I do not think any further imaging besides CT head and C-spine is indicated at this time.  Basic labs and hydration ordered.  Final Clinical Impression(s) / ED Diagnoses Final diagnoses:  Dehydration  Syncope and collapse   Traumatic injury of head, initial encounter    Rx / DC Orders ED Discharge Orders     None        Varney Biles, MD 09/26/21 1455

## 2021-09-26 NOTE — ED Triage Notes (Addendum)
Per EMS-[place] and had a syncopal episode-states he got over heated-was "out"  for 5 minutes-when EMS arrived patient was oriented X4-had 1 40oz this am which is not uncommon for him-complaining of neck pain from fall-placed in a C-Collar-CBG 114

## 2021-09-26 NOTE — ED Notes (Signed)
Ambulated to bathroom. Gait steady. No complaints. No signs of distress.

## 2021-09-26 NOTE — Discharge Instructions (Addendum)
You are seen in the ER after he had a fainting spell.  All the results in the ER are reassuring.  Hydrate well.  Avoid excessive alcohol intake.

## 2021-09-26 NOTE — ED Triage Notes (Signed)
"  I did have a beer this morning and had on hot clothes and just got hot in post office" per pt

## 2021-10-09 ENCOUNTER — Other Ambulatory Visit: Payer: Self-pay | Admitting: Dermatology

## 2021-10-09 ENCOUNTER — Other Ambulatory Visit: Payer: Self-pay | Admitting: Nurse Practitioner

## 2021-10-09 ENCOUNTER — Encounter: Payer: Self-pay | Admitting: *Deleted

## 2021-10-09 ENCOUNTER — Telehealth: Payer: Self-pay | Admitting: *Deleted

## 2021-10-09 DIAGNOSIS — D239 Other benign neoplasm of skin, unspecified: Secondary | ICD-10-CM

## 2021-10-09 DIAGNOSIS — L810 Postinflammatory hyperpigmentation: Secondary | ICD-10-CM

## 2021-10-09 DIAGNOSIS — G8929 Other chronic pain: Secondary | ICD-10-CM

## 2021-10-09 NOTE — [facility] Nurse Program (Signed)
  Dept: [facility] Nurse Program Note  Date of Encounter: 10/09/2021  Past Medical History: Past Medical History:  Diagnosis Date   Alcohol abuse    Allergy    Anemia    Anxiety    Arthritis    Blind left eye    Cataract, right eye    Congenital blindness    LEFT EYE   Depression    Glaucoma, bilateral    Hepatic cirrhosis ([place])    Hepatitis    HEP C treated in 2016   HTN (hypertension)    Seizures ([place])    3 years ago    Encounter Details:  CNP Questionnaire - 10/09/21 1456       Questionnaire   Do you give verbal consent to treat you today? Yes    Location Patient [place] or Organization    Patient Status Unknown    Insurance Medicaid;Medicare    Insurance Referral N/A    Medication Have Medication Insecurities   see CN note   Medical Provider Yes    Screening Referrals N/A    Medical Referral N/A    Medical Appointment Made N/A    Food N/A    Transportation N/A    Housing/Utilities N/A    Interpersonal Safety N/A    Intervention Advocate;Support    ED Visit Averted N/A    Life-Saving Intervention Made N/A            Client seen in [facility] lobby using the [facility] phone. Client requested a pair of reading glasses. Gave some to client as requested. Client is asking for help getting medication Tacrolimus for his skin. He reports his insurance is not covering it because of being a "Tier 4". Contacted pharmacy ([phone]) [facility]. The cost of the medication is $600. He will run through insurance again. If denied again, he will call MD for new medication or have MD try prior authorization.  Keene Gilkey W RN CN [phone]

## 2021-10-09 NOTE — Telephone Encounter (Signed)
Spoke with pharmacist about medication Tacrolimus. See CN note

## 2021-10-11 ENCOUNTER — Telehealth: Payer: Self-pay | Admitting: *Deleted

## 2021-10-11 NOTE — Telephone Encounter (Signed)
Called Dr Onalee Hua office [facility] and spoke with nurse. Client's medication must be sent through [facility] and is not covered with medicaid.

## 2021-10-11 NOTE — Telephone Encounter (Signed)
Have tried to return call to client multiple times today and mailbox is full. Per MD office, client's dermatology medication must be sent with [facility]. It is not covered through medicaid.

## 2021-10-13 ENCOUNTER — Telehealth: Payer: Self-pay | Admitting: *Deleted

## 2021-10-13 NOTE — Telephone Encounter (Signed)
Received multiple messages from client when voice mail checked. Called back number left on voice mail [phone] and voice mail full. Called number listed for client in computer and it has calling restrictions. Per prior conversation with [facility] nurse, client instructed to use [facility] for prescription not covered under medicaid.

## 2021-10-23 ENCOUNTER — Inpatient Hospital Stay (HOSPITAL_COMMUNITY): Payer: Medicare Other

## 2021-10-23 ENCOUNTER — Emergency Department (HOSPITAL_COMMUNITY): Payer: Medicare Other

## 2021-10-23 ENCOUNTER — Inpatient Hospital Stay (HOSPITAL_COMMUNITY)
Admission: EM | Admit: 2021-10-23 | Discharge: 2021-12-14 | DRG: 004 | Disposition: A | Discharge status: Skilled Nursing Facility | Payer: Medicare Other | Attending: Internal Medicine | Admitting: Internal Medicine

## 2021-10-23 DIAGNOSIS — I808 Phlebitis and thrombophlebitis of other sites: Secondary | ICD-10-CM | POA: Diagnosis not present

## 2021-10-23 DIAGNOSIS — M19031 Primary osteoarthritis, right wrist: Secondary | ICD-10-CM | POA: Diagnosis not present

## 2021-10-23 DIAGNOSIS — E870 Hyperosmolality and hypernatremia: Secondary | ICD-10-CM | POA: Diagnosis present

## 2021-10-23 DIAGNOSIS — J189 Pneumonia, unspecified organism: Secondary | ICD-10-CM

## 2021-10-23 DIAGNOSIS — E872 Acidosis, unspecified: Secondary | ICD-10-CM | POA: Diagnosis not present

## 2021-10-23 DIAGNOSIS — E875 Hyperkalemia: Secondary | ICD-10-CM | POA: Diagnosis not present

## 2021-10-23 DIAGNOSIS — I639 Cerebral infarction, unspecified: Secondary | ICD-10-CM | POA: Diagnosis not present

## 2021-10-23 DIAGNOSIS — J15211 Pneumonia due to Methicillin susceptible Staphylococcus aureus: Secondary | ICD-10-CM | POA: Diagnosis not present

## 2021-10-23 DIAGNOSIS — M79661 Pain in right lower leg: Secondary | ICD-10-CM | POA: Diagnosis not present

## 2021-10-23 DIAGNOSIS — J0410 Acute tracheitis without obstruction: Secondary | ICD-10-CM | POA: Diagnosis not present

## 2021-10-23 DIAGNOSIS — G8191 Hemiplegia, unspecified affecting right dominant side: Secondary | ICD-10-CM | POA: Diagnosis not present

## 2021-10-23 DIAGNOSIS — I161 Hypertensive emergency: Secondary | ICD-10-CM | POA: Diagnosis present

## 2021-10-23 DIAGNOSIS — E871 Hypo-osmolality and hyponatremia: Secondary | ICD-10-CM | POA: Diagnosis not present

## 2021-10-23 DIAGNOSIS — J440 Chronic obstructive pulmonary disease with acute lower respiratory infection: Secondary | ICD-10-CM | POA: Diagnosis present

## 2021-10-23 DIAGNOSIS — I6523 Occlusion and stenosis of bilateral carotid arteries: Secondary | ICD-10-CM | POA: Diagnosis not present

## 2021-10-23 DIAGNOSIS — Z7189 Other specified counseling: Secondary | ICD-10-CM | POA: Diagnosis not present

## 2021-10-23 DIAGNOSIS — J9503 Malfunction of tracheostomy stoma: Secondary | ICD-10-CM | POA: Diagnosis not present

## 2021-10-23 DIAGNOSIS — J9610 Chronic respiratory failure, unspecified whether with hypoxia or hypercapnia: Secondary | ICD-10-CM | POA: Diagnosis not present

## 2021-10-23 DIAGNOSIS — T508X5A Adverse effect of diagnostic agents, initial encounter: Secondary | ICD-10-CM | POA: Diagnosis not present

## 2021-10-23 DIAGNOSIS — G7281 Critical illness myopathy: Secondary | ICD-10-CM | POA: Diagnosis not present

## 2021-10-23 DIAGNOSIS — I96 Gangrene, not elsewhere classified: Secondary | ICD-10-CM

## 2021-10-23 DIAGNOSIS — G40901 Epilepsy, unspecified, not intractable, with status epilepticus: Secondary | ICD-10-CM | POA: Diagnosis present

## 2021-10-23 DIAGNOSIS — J14 Pneumonia due to Hemophilus influenzae: Secondary | ICD-10-CM | POA: Diagnosis not present

## 2021-10-23 DIAGNOSIS — I70209 Unspecified atherosclerosis of native arteries of extremities, unspecified extremity: Secondary | ICD-10-CM | POA: Diagnosis present

## 2021-10-23 DIAGNOSIS — R609 Edema, unspecified: Secondary | ICD-10-CM | POA: Diagnosis not present

## 2021-10-23 DIAGNOSIS — F32A Depression, unspecified: Secondary | ICD-10-CM | POA: Diagnosis not present

## 2021-10-23 DIAGNOSIS — L8990 Pressure ulcer of unspecified site, unspecified stage: Secondary | ICD-10-CM | POA: Insufficient documentation

## 2021-10-23 DIAGNOSIS — R569 Unspecified convulsions: Secondary | ICD-10-CM | POA: Diagnosis not present

## 2021-10-23 DIAGNOSIS — M79604 Pain in right leg: Secondary | ICD-10-CM | POA: Diagnosis not present

## 2021-10-23 DIAGNOSIS — Z66 Do not resuscitate: Secondary | ICD-10-CM | POA: Diagnosis not present

## 2021-10-23 DIAGNOSIS — Z789 Other specified health status: Secondary | ICD-10-CM | POA: Diagnosis not present

## 2021-10-23 DIAGNOSIS — M25631 Stiffness of right wrist, not elsewhere classified: Secondary | ICD-10-CM

## 2021-10-23 DIAGNOSIS — K7030 Alcoholic cirrhosis of liver without ascites: Secondary | ICD-10-CM | POA: Diagnosis present

## 2021-10-23 DIAGNOSIS — F17210 Nicotine dependence, cigarettes, uncomplicated: Secondary | ICD-10-CM | POA: Diagnosis present

## 2021-10-23 DIAGNOSIS — R1310 Dysphagia, unspecified: Secondary | ICD-10-CM | POA: Diagnosis not present

## 2021-10-23 DIAGNOSIS — Z79899 Other long term (current) drug therapy: Secondary | ICD-10-CM

## 2021-10-23 DIAGNOSIS — Z743 Need for continuous supervision: Secondary | ICD-10-CM | POA: Diagnosis not present

## 2021-10-23 DIAGNOSIS — J9602 Acute respiratory failure with hypercapnia: Secondary | ICD-10-CM | POA: Diagnosis present

## 2021-10-23 DIAGNOSIS — R2689 Other abnormalities of gait and mobility: Secondary | ICD-10-CM | POA: Diagnosis not present

## 2021-10-23 DIAGNOSIS — G8384 Todd's paralysis (postepileptic): Secondary | ICD-10-CM | POA: Diagnosis present

## 2021-10-23 DIAGNOSIS — E43 Unspecified severe protein-calorie malnutrition: Secondary | ICD-10-CM | POA: Diagnosis present

## 2021-10-23 DIAGNOSIS — F1010 Alcohol abuse, uncomplicated: Secondary | ICD-10-CM | POA: Diagnosis present

## 2021-10-23 DIAGNOSIS — R059 Cough, unspecified: Secondary | ICD-10-CM | POA: Diagnosis not present

## 2021-10-23 DIAGNOSIS — L89620 Pressure ulcer of left heel, unstageable: Secondary | ICD-10-CM | POA: Diagnosis present

## 2021-10-23 DIAGNOSIS — J9600 Acute respiratory failure, unspecified whether with hypoxia or hypercapnia: Secondary | ICD-10-CM

## 2021-10-23 DIAGNOSIS — Z430 Encounter for attention to tracheostomy: Secondary | ICD-10-CM | POA: Diagnosis not present

## 2021-10-23 DIAGNOSIS — I6340 Cerebral infarction due to embolism of unspecified cerebral artery: Secondary | ICD-10-CM | POA: Diagnosis not present

## 2021-10-23 DIAGNOSIS — G9340 Encephalopathy, unspecified: Secondary | ICD-10-CM

## 2021-10-23 DIAGNOSIS — G459 Transient cerebral ischemic attack, unspecified: Secondary | ICD-10-CM | POA: Diagnosis not present

## 2021-10-23 DIAGNOSIS — G9341 Metabolic encephalopathy: Secondary | ICD-10-CM | POA: Diagnosis present

## 2021-10-23 DIAGNOSIS — F419 Anxiety disorder, unspecified: Secondary | ICD-10-CM | POA: Diagnosis present

## 2021-10-23 DIAGNOSIS — Z6824 Body mass index (BMI) 24.0-24.9, adult: Secondary | ICD-10-CM

## 2021-10-23 DIAGNOSIS — M25531 Pain in right wrist: Secondary | ICD-10-CM | POA: Diagnosis not present

## 2021-10-23 DIAGNOSIS — K56609 Unspecified intestinal obstruction, unspecified as to partial versus complete obstruction: Secondary | ICD-10-CM | POA: Diagnosis not present

## 2021-10-23 DIAGNOSIS — J40 Bronchitis, not specified as acute or chronic: Secondary | ICD-10-CM | POA: Diagnosis not present

## 2021-10-23 DIAGNOSIS — L89612 Pressure ulcer of right heel, stage 2: Secondary | ICD-10-CM | POA: Diagnosis present

## 2021-10-23 DIAGNOSIS — R404 Transient alteration of awareness: Secondary | ICD-10-CM | POA: Diagnosis not present

## 2021-10-23 DIAGNOSIS — Z452 Encounter for adjustment and management of vascular access device: Secondary | ICD-10-CM | POA: Diagnosis not present

## 2021-10-23 DIAGNOSIS — T8089XA Other complications following infusion, transfusion and therapeutic injection, initial encounter: Secondary | ICD-10-CM

## 2021-10-23 DIAGNOSIS — G319 Degenerative disease of nervous system, unspecified: Secondary | ICD-10-CM | POA: Diagnosis not present

## 2021-10-23 DIAGNOSIS — D6489 Other specified anemias: Secondary | ICD-10-CM | POA: Diagnosis present

## 2021-10-23 DIAGNOSIS — M25639 Stiffness of unspecified wrist, not elsewhere classified: Secondary | ICD-10-CM

## 2021-10-23 DIAGNOSIS — S41112A Laceration without foreign body of left upper arm, initial encounter: Secondary | ICD-10-CM | POA: Diagnosis not present

## 2021-10-23 DIAGNOSIS — H5462 Unqualified visual loss, left eye, normal vision right eye: Secondary | ICD-10-CM | POA: Diagnosis present

## 2021-10-23 DIAGNOSIS — E876 Hypokalemia: Secondary | ICD-10-CM | POA: Diagnosis not present

## 2021-10-23 DIAGNOSIS — M6281 Muscle weakness (generalized): Secondary | ICD-10-CM | POA: Diagnosis not present

## 2021-10-23 DIAGNOSIS — Z20822 Contact with and (suspected) exposure to covid-19: Secondary | ICD-10-CM | POA: Diagnosis present

## 2021-10-23 DIAGNOSIS — R0600 Dyspnea, unspecified: Secondary | ICD-10-CM | POA: Diagnosis not present

## 2021-10-23 DIAGNOSIS — R7303 Prediabetes: Secondary | ICD-10-CM | POA: Diagnosis present

## 2021-10-23 DIAGNOSIS — A419 Sepsis, unspecified organism: Secondary | ICD-10-CM | POA: Diagnosis not present

## 2021-10-23 DIAGNOSIS — J9690 Respiratory failure, unspecified, unspecified whether with hypoxia or hypercapnia: Secondary | ICD-10-CM

## 2021-10-23 DIAGNOSIS — R509 Fever, unspecified: Secondary | ICD-10-CM

## 2021-10-23 DIAGNOSIS — N170 Acute kidney failure with tubular necrosis: Secondary | ICD-10-CM | POA: Diagnosis not present

## 2021-10-23 DIAGNOSIS — Z515 Encounter for palliative care: Secondary | ICD-10-CM | POA: Diagnosis not present

## 2021-10-23 DIAGNOSIS — I959 Hypotension, unspecified: Secondary | ICD-10-CM | POA: Diagnosis not present

## 2021-10-23 DIAGNOSIS — J449 Chronic obstructive pulmonary disease, unspecified: Secondary | ICD-10-CM

## 2021-10-23 DIAGNOSIS — R262 Difficulty in walking, not elsewhere classified: Secondary | ICD-10-CM | POA: Diagnosis not present

## 2021-10-23 DIAGNOSIS — I10 Essential (primary) hypertension: Secondary | ICD-10-CM | POA: Diagnosis present

## 2021-10-23 DIAGNOSIS — R1312 Dysphagia, oropharyngeal phase: Secondary | ICD-10-CM | POA: Diagnosis not present

## 2021-10-23 DIAGNOSIS — Z8619 Personal history of other infectious and parasitic diseases: Secondary | ICD-10-CM

## 2021-10-23 DIAGNOSIS — Z9289 Personal history of other medical treatment: Secondary | ICD-10-CM

## 2021-10-23 DIAGNOSIS — N179 Acute kidney failure, unspecified: Secondary | ICD-10-CM

## 2021-10-23 DIAGNOSIS — R7401 Elevation of levels of liver transaminase levels: Secondary | ICD-10-CM

## 2021-10-23 DIAGNOSIS — S62101D Fracture of unspecified carpal bone, right wrist, subsequent encounter for fracture with routine healing: Secondary | ICD-10-CM

## 2021-10-23 DIAGNOSIS — J9811 Atelectasis: Secondary | ICD-10-CM | POA: Diagnosis not present

## 2021-10-23 DIAGNOSIS — K760 Fatty (change of) liver, not elsewhere classified: Secondary | ICD-10-CM | POA: Diagnosis not present

## 2021-10-23 DIAGNOSIS — M6282 Rhabdomyolysis: Secondary | ICD-10-CM | POA: Diagnosis not present

## 2021-10-23 DIAGNOSIS — Z930 Tracheostomy status: Secondary | ICD-10-CM

## 2021-10-23 DIAGNOSIS — R0902 Hypoxemia: Secondary | ICD-10-CM | POA: Diagnosis not present

## 2021-10-23 DIAGNOSIS — Z9911 Dependence on respirator [ventilator] status: Secondary | ICD-10-CM | POA: Diagnosis not present

## 2021-10-23 DIAGNOSIS — I499 Cardiac arrhythmia, unspecified: Secondary | ICD-10-CM | POA: Diagnosis not present

## 2021-10-23 DIAGNOSIS — I6310 Cerebral infarction due to embolism of unspecified precerebral artery: Secondary | ICD-10-CM | POA: Diagnosis not present

## 2021-10-23 DIAGNOSIS — J9601 Acute respiratory failure with hypoxia: Secondary | ICD-10-CM

## 2021-10-23 DIAGNOSIS — Z4682 Encounter for fitting and adjustment of non-vascular catheter: Secondary | ICD-10-CM | POA: Diagnosis not present

## 2021-10-23 DIAGNOSIS — T502X5A Adverse effect of carbonic-anhydrase inhibitors, benzothiadiazides and other diuretics, initial encounter: Secondary | ICD-10-CM | POA: Diagnosis not present

## 2021-10-23 DIAGNOSIS — R6889 Other general symptoms and signs: Secondary | ICD-10-CM | POA: Diagnosis not present

## 2021-10-23 DIAGNOSIS — E8770 Fluid overload, unspecified: Secondary | ICD-10-CM | POA: Diagnosis not present

## 2021-10-23 DIAGNOSIS — E162 Hypoglycemia, unspecified: Secondary | ICD-10-CM | POA: Diagnosis not present

## 2021-10-23 DIAGNOSIS — Z886 Allergy status to analgesic agent status: Secondary | ICD-10-CM

## 2021-10-23 DIAGNOSIS — I739 Peripheral vascular disease, unspecified: Secondary | ICD-10-CM

## 2021-10-23 DIAGNOSIS — J90 Pleural effusion, not elsewhere classified: Secondary | ICD-10-CM | POA: Diagnosis not present

## 2021-10-23 DIAGNOSIS — R918 Other nonspecific abnormal finding of lung field: Secondary | ICD-10-CM | POA: Diagnosis not present

## 2021-10-23 DIAGNOSIS — N1411 Contrast-induced nephropathy: Secondary | ICD-10-CM | POA: Diagnosis not present

## 2021-10-23 DIAGNOSIS — M2550 Pain in unspecified joint: Secondary | ICD-10-CM

## 2021-10-23 DIAGNOSIS — I6300 Cerebral infarction due to thrombosis of unspecified precerebral artery: Secondary | ICD-10-CM | POA: Diagnosis not present

## 2021-10-23 DIAGNOSIS — Z4659 Encounter for fitting and adjustment of other gastrointestinal appliance and device: Secondary | ICD-10-CM

## 2021-10-23 DIAGNOSIS — I6389 Other cerebral infarction: Secondary | ICD-10-CM | POA: Diagnosis not present

## 2021-10-23 DIAGNOSIS — T8089XD Other complications following infusion, transfusion and therapeutic injection, subsequent encounter: Secondary | ICD-10-CM | POA: Diagnosis not present

## 2021-10-23 DIAGNOSIS — E785 Hyperlipidemia, unspecified: Secondary | ICD-10-CM | POA: Diagnosis present

## 2021-10-23 DIAGNOSIS — R0602 Shortness of breath: Secondary | ICD-10-CM | POA: Diagnosis not present

## 2021-10-23 DIAGNOSIS — E8779 Other fluid overload: Secondary | ICD-10-CM | POA: Diagnosis not present

## 2021-10-23 DIAGNOSIS — L98429 Non-pressure chronic ulcer of back with unspecified severity: Secondary | ICD-10-CM | POA: Diagnosis present

## 2021-10-23 DIAGNOSIS — J9691 Respiratory failure, unspecified with hypoxia: Secondary | ICD-10-CM

## 2021-10-23 DIAGNOSIS — J209 Acute bronchitis, unspecified: Secondary | ICD-10-CM | POA: Diagnosis not present

## 2021-10-23 LAB — APTT: aPTT: 32 seconds (ref 24–36)

## 2021-10-23 LAB — RAPID URINE DRUG SCREEN, HOSP PERFORMED
Amphetamines: NOT DETECTED
Barbiturates: NOT DETECTED
Benzodiazepines: NOT DETECTED
Cocaine: NOT DETECTED
Opiates: NOT DETECTED
Tetrahydrocannabinol: NOT DETECTED

## 2021-10-23 LAB — COMPREHENSIVE METABOLIC PANEL
ALT: 134 U/L — ABNORMAL HIGH (ref 0–44)
AST: 97 U/L — ABNORMAL HIGH (ref 15–41)
Albumin: 4.3 g/dL (ref 3.5–5.0)
Alkaline Phosphatase: 137 U/L — ABNORMAL HIGH (ref 38–126)
Anion gap: 20 — ABNORMAL HIGH (ref 5–15)
BUN: 6 mg/dL — ABNORMAL LOW (ref 8–23)
CO2: 19 mmol/L — ABNORMAL LOW (ref 22–32)
Calcium: 9.6 mg/dL (ref 8.9–10.3)
Chloride: 105 mmol/L (ref 98–111)
Creatinine, Ser: 1.46 mg/dL — ABNORMAL HIGH (ref 0.61–1.24)
GFR, Estimated: 52 mL/min — ABNORMAL LOW (ref >60)
Glucose, Bld: 180 mg/dL — ABNORMAL HIGH (ref 70–99)
Potassium: 3.9 mmol/L (ref 3.5–5.1)
Sodium: 144 mmol/L (ref 135–145)
Total Bilirubin: 0.6 mg/dL (ref 0.3–1.2)
Total Protein: 8.1 g/dL (ref 6.5–8.1)

## 2021-10-23 LAB — I-STAT ARTERIAL BLOOD GAS, ED
Acid-base deficit: 6 mmol/L — ABNORMAL HIGH (ref 0.0–2.0)
Acid-base deficit: 4 mmol/L — ABNORMAL HIGH (ref 0.0–2.0)
Acid-base deficit: 2 mmol/L (ref 0.0–2.0)
Bicarbonate: 24.1 mmol/L (ref 20.0–28.0)
Bicarbonate: 20.6 mmol/L (ref 20.0–28.0)
Bicarbonate: 21.9 mmol/L (ref 20.0–28.0)
Calcium, Ion: 1.22 mmol/L (ref 1.15–1.40)
Calcium, Ion: 1.14 mmol/L — ABNORMAL LOW (ref 1.15–1.40)
Calcium, Ion: 1.11 mmol/L — ABNORMAL LOW (ref 1.15–1.40)
HCT: 40 % (ref 39.0–52.0)
HCT: 31 % — ABNORMAL LOW (ref 39.0–52.0)
HCT: 33 % — ABNORMAL LOW (ref 39.0–52.0)
Hemoglobin: 13.6 g/dL (ref 13.0–17.0)
Hemoglobin: 10.5 g/dL — ABNORMAL LOW (ref 13.0–17.0)
Hemoglobin: 11.2 g/dL — ABNORMAL LOW (ref 13.0–17.0)
O2 Saturation: 100 %
O2 Saturation: 98 %
O2 Saturation: 99 %
Patient temperature: 98.6
Potassium: 5.4 mmol/L — ABNORMAL HIGH (ref 3.5–5.1)
Potassium: 4.5 mmol/L (ref 3.5–5.1)
Potassium: 3.3 mmol/L — ABNORMAL LOW (ref 3.5–5.1)
Sodium: 138 mmol/L (ref 135–145)
Sodium: 140 mmol/L (ref 135–145)
Sodium: 139 mmol/L (ref 135–145)
TCO2: 26 mmol/L (ref 22–32)
TCO2: 22 mmol/L (ref 22–32)
TCO2: 23 mmol/L (ref 22–32)
pCO2 arterial: 66.3 mmHg (ref 32.0–48.0)
pCO2 arterial: 33.1 mmHg (ref 32.0–48.0)
pCO2 arterial: 31.8 mmHg — ABNORMAL LOW (ref 32.0–48.0)
pH, Arterial: 7.169 — CL (ref 7.350–7.450)
pH, Arterial: 7.402 (ref 7.350–7.450)
pH, Arterial: 7.445 (ref 7.350–7.450)
pO2, Arterial: 413 mmHg — ABNORMAL HIGH (ref 83.0–108.0)
pO2, Arterial: 104 mmHg (ref 83.0–108.0)
pO2, Arterial: 117 mmHg — ABNORMAL HIGH (ref 83.0–108.0)

## 2021-10-23 LAB — URINALYSIS, ROUTINE W REFLEX MICROSCOPIC
Bilirubin Urine: NEGATIVE
Glucose, UA: NEGATIVE mg/dL
Hgb urine dipstick: NEGATIVE
Ketones, ur: NEGATIVE mg/dL
Leukocytes,Ua: NEGATIVE
Nitrite: NEGATIVE
Protein, ur: 100 mg/dL — AB
Specific Gravity, Urine: 1.016 (ref 1.005–1.030)
pH: 5 (ref 5.0–8.0)

## 2021-10-23 LAB — CBC
HCT: 42.3 % (ref 39.0–52.0)
Hemoglobin: 13.3 g/dL (ref 13.0–17.0)
MCH: 28.4 pg (ref 26.0–34.0)
MCHC: 31.4 g/dL (ref 30.0–36.0)
MCV: 90.2 fL (ref 80.0–100.0)
Platelets: 274 10*3/uL (ref 150–400)
RBC: 4.69 MIL/uL (ref 4.22–5.81)
RDW: 15.1 % (ref 11.5–15.5)
WBC: 12.6 10*3/uL — ABNORMAL HIGH (ref 4.0–10.5)
nRBC: 0 % (ref 0.0–0.2)

## 2021-10-23 LAB — PROTIME-INR
INR: 1.1 (ref 0.8–1.2)
Prothrombin Time: 14.2 seconds (ref 11.4–15.2)

## 2021-10-23 LAB — I-STAT CHEM 8, ED
BUN: 6 mg/dL — ABNORMAL LOW (ref 8–23)
Calcium, Ion: 1.19 mmol/L (ref 1.15–1.40)
Chloride: 106 mmol/L (ref 98–111)
Creatinine, Ser: 1.2 mg/dL (ref 0.61–1.24)
Glucose, Bld: 169 mg/dL — ABNORMAL HIGH (ref 70–99)
HCT: 44 % (ref 39.0–52.0)
Hemoglobin: 15 g/dL (ref 13.0–17.0)
Potassium: 3.9 mmol/L (ref 3.5–5.1)
Sodium: 145 mmol/L (ref 135–145)
TCO2: 21 mmol/L — ABNORMAL LOW (ref 22–32)

## 2021-10-23 LAB — DIFFERENTIAL
Abs Immature Granulocytes: 0.08 10*3/uL — ABNORMAL HIGH (ref 0.00–0.07)
Basophils Absolute: 0 10*3/uL (ref 0.0–0.1)
Basophils Relative: 0 %
Eosinophils Absolute: 0 10*3/uL (ref 0.0–0.5)
Eosinophils Relative: 0 %
Immature Granulocytes: 1 %
Lymphocytes Relative: 32 %
Lymphs Abs: 4 10*3/uL (ref 0.7–4.0)
Monocytes Absolute: 0.9 10*3/uL (ref 0.1–1.0)
Monocytes Relative: 7 %
Neutro Abs: 7.5 10*3/uL (ref 1.7–7.7)
Neutrophils Relative %: 60 %

## 2021-10-23 LAB — RESP PANEL BY RT-PCR (FLU A&B, COVID) ARPGX2
Influenza A by PCR: NEGATIVE
Influenza B by PCR: NEGATIVE
SARS Coronavirus 2 by RT PCR: NEGATIVE

## 2021-10-23 LAB — ETHANOL: Alcohol, Ethyl (B): <10 mg/dL (ref <10)

## 2021-10-23 LAB — CBG MONITORING, ED: Glucose-Capillary: 166 mg/dL — ABNORMAL HIGH (ref 70–99)

## 2021-10-23 MED ORDER — SODIUM CHLORIDE 0.9 % IV SOLN
1500.0000 mg | Freq: Once | INTRAVENOUS | Status: AC
  Administered 2021-10-23: 1500 mg via INTRAVENOUS
  Filled 2021-10-23: qty 30

## 2021-10-23 MED ORDER — LEVETIRACETAM IN NACL 1500 MG/100ML IV SOLN
1500.0000 mg | Freq: Two times a day (BID) | INTRAVENOUS | Status: DC
  Administered 2021-10-23 – 2021-10-28 (×11): 1500 mg via INTRAVENOUS
  Filled 2021-10-23 (×11): qty 100

## 2021-10-23 MED ORDER — PROPOFOL 1000 MG/100ML IV EMUL
INTRAVENOUS | Status: AC
  Administered 2021-10-23: 10 ug/kg/min via INTRAVENOUS
  Filled 2021-10-23: qty 100

## 2021-10-23 MED ORDER — DOCUSATE SODIUM 100 MG PO CAPS: 100.0000 mg | ORAL_CAPSULE | Freq: Two times a day (BID) | ORAL | Status: DC | PRN

## 2021-10-23 MED ORDER — ENOXAPARIN SODIUM 40 MG/0.4ML IJ SOSY: 40.0000 mg | PREFILLED_SYRINGE | INTRAMUSCULAR | Status: DC

## 2021-10-23 MED ORDER — SODIUM CHLORIDE 0.9% FLUSH
3.0000 mL | Freq: Once | INTRAVENOUS | Status: AC
Start: 2021-10-23 — End: 2021-10-23
  Administered 2021-10-23: 3 mL via INTRAVENOUS

## 2021-10-23 MED ORDER — LABETALOL HCL 5 MG/ML IV SOLN
10.0000 mg | Freq: Once | INTRAVENOUS | Status: AC
  Administered 2021-10-23: 10 mg via INTRAVENOUS
  Filled 2021-10-23: qty 4

## 2021-10-23 MED ORDER — SODIUM CHLORIDE 0.9 % IV SOLN
250.0000 mL | INTRAVENOUS | Status: DC
  Administered 2021-10-29: 250 mL via INTRAVENOUS

## 2021-10-23 MED ORDER — LEVETIRACETAM IN NACL 1000 MG/100ML IV SOLN
1000.0000 mg | INTRAVENOUS | Status: AC
  Administered 2021-10-23 (×3): 1000 mg via INTRAVENOUS

## 2021-10-23 MED ORDER — PANTOPRAZOLE SODIUM 40 MG IV SOLR
40.0000 mg | INTRAVENOUS | Status: DC
  Administered 2021-10-23 – 2021-10-30 (×8): 40 mg via INTRAVENOUS
  Filled 2021-10-23 (×8): qty 40

## 2021-10-23 MED ORDER — NOREPINEPHRINE 4 MG/250ML-% IV SOLN
2.0000 ug/min | INTRAVENOUS | Status: DC
  Administered 2021-10-25: 8 ug/min via INTRAVENOUS
  Administered 2021-10-25: 2 ug/min via INTRAVENOUS
  Administered 2021-10-26: 3 ug/min via INTRAVENOUS
  Administered 2021-10-27: 2 ug/min via INTRAVENOUS
  Administered 2021-10-27: 3 ug/min via INTRAVENOUS
  Filled 2021-10-23 (×6): qty 250

## 2021-10-23 MED ORDER — ETOMIDATE 2 MG/ML IV SOLN
INTRAVENOUS | Status: AC
  Administered 2021-10-23: 8 mg via INTRAVENOUS
  Filled 2021-10-23: qty 10

## 2021-10-23 MED ORDER — ETOMIDATE 2 MG/ML IV SOLN: 8.0000 mg | Freq: Once | INTRAVENOUS | Status: AC

## 2021-10-23 MED ORDER — POLYETHYLENE GLYCOL 3350 17 G PO PACK: 17.0000 g | PACK | Freq: Every day | ORAL | Status: DC | PRN

## 2021-10-23 MED ORDER — LORAZEPAM 2 MG/ML IJ SOLN: 2.0000 mg | Freq: Once | INTRAMUSCULAR | Status: AC

## 2021-10-23 MED ORDER — IOHEXOL 350 MG/ML SOLN
100.0000 mL | Freq: Once | INTRAVENOUS | Status: AC | PRN
  Administered 2021-10-23: 100 mL via INTRAVENOUS

## 2021-10-23 MED ORDER — SUCCINYLCHOLINE CHLORIDE 200 MG/10ML IV SOSY: 100.0000 mg | PREFILLED_SYRINGE | Freq: Once | INTRAVENOUS | Status: AC

## 2021-10-23 MED ORDER — LACTATED RINGERS IV BOLUS
1000.0000 mL | Freq: Once | INTRAVENOUS | Status: AC
  Administered 2021-10-23: 1000 mL via INTRAVENOUS

## 2021-10-23 MED ORDER — MIDAZOLAM-SODIUM CHLORIDE 100-0.9 MG/100ML-% IV SOLN
5.0000 mg/h | INTRAVENOUS | Status: DC
  Administered 2021-10-23 – 2021-10-26 (×8): 10 mg/h via INTRAVENOUS
  Administered 2021-10-27: 5 mg/h via INTRAVENOUS
  Administered 2021-10-27: 4 mg/h via INTRAVENOUS
  Filled 2021-10-23 (×11): qty 100

## 2021-10-23 MED ORDER — CHLORHEXIDINE GLUCONATE 0.12% ORAL RINSE (MEDLINE KIT)
15.0000 mL | Freq: Two times a day (BID) | OROMUCOSAL | Status: DC
Start: 2021-10-23 — End: 2021-11-18
  Administered 2021-10-23 – 2021-11-17 (×51): 15 mL via OROMUCOSAL

## 2021-10-23 MED ORDER — SUCCINYLCHOLINE CHLORIDE 200 MG/10ML IV SOSY
PREFILLED_SYRINGE | INTRAVENOUS | Status: AC
  Administered 2021-10-23: 100 mg via INTRAVENOUS
  Filled 2021-10-23: qty 10

## 2021-10-23 MED ORDER — PHENYTOIN SODIUM 50 MG/ML IJ SOLN: 100.0000 mg | Freq: Three times a day (TID) | INTRAMUSCULAR | Status: DC

## 2021-10-23 MED ORDER — MIDAZOLAM HCL (PF) 5 MG/ML IJ SOLN
15.0000 mg | INTRAMUSCULAR | Status: DC | PRN
Start: 2021-10-23 — End: 2021-10-29

## 2021-10-23 MED ORDER — MIDAZOLAM BOLUS VIA INFUSION
15.0000 mg | INTRAVENOUS | Status: DC | PRN
  Administered 2021-10-23 (×2): 15 mg via INTRAVENOUS
  Filled 2021-10-23: qty 15

## 2021-10-23 MED ORDER — SODIUM CHLORIDE 0.9 % IV SOLN: INTRAVENOUS | Status: DC

## 2021-10-23 MED ORDER — FAMOTIDINE IN NACL 20-0.9 MG/50ML-% IV SOLN: 20.0000 mg | Freq: Two times a day (BID) | INTRAVENOUS | Status: DC

## 2021-10-23 MED ORDER — PROPOFOL 1000 MG/100ML IV EMUL
5.0000 ug/kg/min | INTRAVENOUS | Status: DC
  Administered 2021-10-23: 40 ug/kg/min via INTRAVENOUS
  Administered 2021-10-23: 25 ug/kg/min via INTRAVENOUS
  Administered 2021-10-24 (×5): 40 ug/kg/min via INTRAVENOUS
  Administered 2021-10-25 (×4): 60 ug/kg/min via INTRAVENOUS
  Administered 2021-10-25: 40 ug/kg/min via INTRAVENOUS
  Administered 2021-10-25: 60 ug/kg/min via INTRAVENOUS
  Administered 2021-10-26: 40 ug/kg/min via INTRAVENOUS
  Administered 2021-10-26 (×2): 60 ug/kg/min via INTRAVENOUS
  Administered 2021-10-26: 40 ug/kg/min via INTRAVENOUS
  Administered 2021-10-27 (×3): 30 ug/kg/min via INTRAVENOUS
  Administered 2021-10-27: 40 ug/kg/min via INTRAVENOUS
  Administered 2021-10-28: 20 ug/kg/min via INTRAVENOUS
  Administered 2021-10-28: 30 ug/kg/min via INTRAVENOUS
  Administered 2021-10-29: 20 ug/kg/min via INTRAVENOUS
  Administered 2021-10-30: 15 ug/kg/min via INTRAVENOUS
  Filled 2021-10-23 (×20): qty 100
  Filled 2021-10-23: qty 200
  Filled 2021-10-23 (×6): qty 100

## 2021-10-23 MED ORDER — LORAZEPAM 2 MG/ML IJ SOLN
INTRAMUSCULAR | Status: AC
  Administered 2021-10-23: 2 mg via INTRAVENOUS
  Filled 2021-10-23: qty 1

## 2021-10-23 MED ORDER — ORAL CARE MOUTH RINSE
15.0000 mL | OROMUCOSAL | Status: DC
Start: 2021-10-24 — End: 2021-11-18
  Administered 2021-10-23 – 2021-11-18 (×253): 15 mL via OROMUCOSAL

## 2021-10-23 NOTE — Procedures (Signed)
Patient Name: Jeffrey Mullen  MRN: [id number]  Epilepsy Attending: Lora Havens  Referring Physician/Provider: Dr Kara Mead Date: 10/23/2021 Duration: 23.10 mins  Patient history: 70 y.o. male with PMH of HTN, PVD, congenital glaucoma, cataract, smoker, left frontal and occipital traumatic contusion with small left frontal SDH in 2019 presented to ED for right gaze and left sided weakness with questionable seizure activity. EEG to evaluate for seizure  Level of alertness: comatose  AEDs during EEG study: LEV, Propofol, Versed  Technical aspects: This EEG study was done with scalp electrodes positioned according to the 10-20 International system of electrode placement. Electrical activity was acquired at a sampling rate of 500Hz  and reviewed with a high frequency filter of 70Hz  and a low frequency filter of 1Hz . EEG data were recorded continuously and digitally stored.   Description: EEG showed predominantly generalized suppression as well as 2-3 seconds of generalized low amplitude [id number]  theta-alpha activity every 1-2 minutes. Hyperventilation and photic stimulation were not performed.   Of note, eeg was technically difficult due to significant electrode and EKG artifact.    ABNORMALITY -Background suppression, generalized - Intermittent slow, generalized  IMPRESSION: This technically difficult study is suggestive of profound diffuse encephalopathy, nonspecific etiology but likely related to sedation. No seizures or epileptiform discharges were seen throughout the recording.  Cace Osorto [name]

## 2021-10-23 NOTE — ED Notes (Signed)
MD notified regarding current bp. MAP is 63. Will continue to monitor.

## 2021-10-23 NOTE — Consult Note (Addendum)
Neurology Consultation Note  Consult Requested by: Dr Dina Rich  Reason for Consult: right gaze and left sided weakness  Consult Date: 10/23/21   The history was obtained from the EMS.  During history and examination, all items were not able to obtain due to AMS.  History of Present Illness:  Jeffrey Mullen is a 70 y.o. African American male with PMH of HTN, PVD, congenital glaucoma, cataract, smoker, left frontal and occipital traumatic contusion with small left frontal SDH in 2019 presented to ED for right gaze and left sided weakness with questionable seizure activity reported by bystander.  Per EMS, they were called at 1:40pm when pt was found collapsing slowly to the floor in a grocery store, did not hit head. Questionable report from bystander that pt had seizure activity but not able to get more info. On EMS arrival, pt on the floor, no seizure but right forced gaze, not talking and left arm and leg not able to lift up. BP 300s and pt sent to ED right away.   At ED bridge, pt still has right gaze, whispering his name and repeated "I am here" but no other verbal output. Left arm flaccid but left leg 3-/5. BP 190s and glucose 166. However, in [place], he had another episode of GTC with eye deviated to the left. Received ativan 2mg  and started keppra load. CT no bleeding. CTA head and neck no LVO and CTP neg.   LSN: 1:40pm tPA Given: No: likely seizure mRS = 2  Past Medical History:  Diagnosis Date   Alcohol abuse    Allergy    Anemia    Anxiety    Arthritis    Blind left eye    Cataract, right eye    Congenital blindness    LEFT EYE   Depression    Glaucoma, bilateral    Hepatic cirrhosis ([place])    Hepatitis    HEP C treated in 2016   HTN (hypertension)    Seizures ([place])    3 years ago    Past Surgical History:  Procedure Laterality Date   CATARACT EXTRACTION Left    GLAUCOMA SURGERY     Patient reports 57 or eleven surgeries for Glaucoma    Family History   Problem Relation Age of Onset   Hearing loss Other    Kidney disease Neg Hx    Colon cancer Neg Hx    Esophageal cancer Neg Hx    Rectal cancer Neg Hx    Stomach cancer Neg Hx     Social History:  reports that he has been smoking cigarettes. He has been smoking an average of .25 packs per day. He has never used smokeless tobacco. He reports current alcohol use of about 5.0 standard drinks per week. He reports that he does not use drugs.  Allergies:  Allergies  Allergen Reactions   Acetaminophen Other (See Comments)    Causes eye pressure to elevate; CANNOT HAVE THIS    Current Facility-Administered Medications on File Prior to Encounter  Medication Dose Route Frequency Provider Last Rate Last Admin   aspirin EC tablet 81 mg  81 mg Oral Daily Cherre Robins, MD       Current Outpatient Medications on File Prior to Encounter  Medication Sig Dispense Refill   amLODipine (NORVASC) 10 MG tablet Take 1 tablet (10 mg total) by mouth daily. 90 tablet 1   atorvastatin (LIPITOR) 40 MG tablet Take 1 tablet (40 mg total) by mouth daily. [place]  tablet 11   atropine 1 % ophthalmic solution Place 1 drop into the left eye 2 (two) times daily. 15 mL 2   bimatoprost (LUMIGAN) 0.01 % SOLN Place 1 drop into the right eye at bedtime.     busPIRone (BUSPAR) 15 MG tablet TAKE ONE TABLET BY MOUTH THREE TIMES A DAY (Patient taking differently: Take 15 mg by mouth 2 (two) times daily as needed (anxiety).) 90 tablet 0   Cholecalciferol (VITAMIN D) 50 MCG (2000 UT) CAPS Take 2,000 Units by mouth daily.     dorzolamide-timolol (COSOPT) 22.3-6.8 MG/ML ophthalmic solution Place 1 drop into the right eye 2 (two) times daily.     ibuprofen (ADVIL) 800 MG tablet TAKE ONE TABLET BY MOUTH EVERY 8 HOURS AS NEEDED 60 tablet 0   lisinopril-hydrochlorothiazide (ZESTORETIC) 20-25 MG tablet Take 1 tablet by mouth daily. 90 tablet 1   loratadine (CLARITIN) 10 MG tablet Take 1 tablet (10 mg total) by mouth daily. (Patient  taking differently: Take 10 mg by mouth daily as needed for allergies.) 90 tablet 1   mupirocin ointment (BACTROBAN) 2 % Apply 1 application topically 2 (two) times daily as needed (affected area(s)).     OVER THE COUNTER MEDICATION Take 1 tablet by mouth daily. Vision formula with lutein.     OVER THE COUNTER MEDICATION Take 1,200 mg by mouth 2 (two) times daily. Glucosamine chondroitin     sildenafil (VIAGRA) 100 MG tablet Take 0.5-1 tablets (50-100 mg total) by mouth daily as needed for erectile dysfunction. 10 tablet prn   Soap & Cleansers (SAYMAN LANOLIN) BAR Apply to affected area 1 each prn   tacrolimus (PROTOPIC) 0.1 % ointment APPLY TO FACE NIGHTLY 60 g 1   triamcinolone (KENALOG) 0.025 % ointment APPLY ONE APPLICATION TOPICALLY TWICE A DAY (Patient taking differently: Apply 1 application topically 2 (two) times daily as needed (rash).) 30 g 0   vitamin C (ASCORBIC ACID) 500 MG tablet Take 500 mg by mouth daily.      Review of Systems: A full ROS was attempted today and was not able to be performed due to AMS.  Physical Examination: Temp:  [98.1 F (36.7 C)] 98.1 F (36.7 C) (10/31 1444) Pulse Rate:  [82-116] 82 (10/31 1501) Resp:  [15] 15 (10/31 1501) BP: (147-200)/(62-80) 147/80 (10/31 1501) SpO2:  [99 %-100 %] 100 % (10/31 1501) Weight:  [79.2 kg] 79.2 kg (10/31 1400)  General - well nourished, well developed, obtunded    Ophthalmologic - fundi not visualized due to noncooperation.    Cardiovascular - regular rhythm and tachycardia  Neuro - examined after CT and pt received ativan. On non-rebreather, eyes closed, not open on voice. Left eye corneal clouding, right eye on the right forced gaze, sluggish doll's eyes, not cross midline. Right pupil 2.70mm, sluggish to light. Weak corneals bilaterally. Not blinking to visual threat bilaterally. Facial symmetrical. Tongue protrusion not cooperative. No movement in all extremities on pain stimulation. Sensation, coordination and  gait not tested.   Data Reviewed: CT Head Wo Contrast  Result Date: 09/26/2021 CLINICAL DATA:  Head trauma, mod-severe; Polytrauma, critical, head/C-spine injury suspected. Syncope. Neck pain. EXAM: CT HEAD WITHOUT CONTRAST CT CERVICAL SPINE WITHOUT CONTRAST TECHNIQUE: Multidetector CT imaging of the head and cervical spine was performed following the standard protocol without intravenous contrast. Multiplanar CT image reconstructions of the cervical spine were also generated. COMPARISON:  11/01/2018 FINDINGS: CT HEAD FINDINGS Brain: There is no evidence of an acute infarct, intracranial hemorrhage, mass, midline shift,  or extra-axial fluid collection. Encephalomalacia inferiorly in the left greater than right frontal lobes and in the posterior right occipital lobe is unchanged and consistent with the sequelae of remote trauma. Hypodensities elsewhere in the cerebral white matter bilaterally are unchanged and nonspecific but compatible with moderate chronic small vessel ischemic disease. The ventricles are normal in size. Vascular: Calcified atherosclerosis at the skull base. No hyperdense vessel. Skull: No acute fracture or suspicious osseous lesion. Partially visualized remote maxillofacial fractures. Sinuses/Orbits: Visualized paranasal sinuses and mastoid air cells are clear. Bilateral cataract extraction. Chronically enlarged left globe with history of glaucoma. Other: None. CT CERVICAL SPINE FINDINGS Alignment: Straightening of the normal cervical lordosis. No listhesis. Skull base and vertebrae: No acute fracture or suspicious osseous lesion. Ligamentum flavum ossification most notable on the right at C3-4. Prominent median C1-2 arthropathy with unchanged noncompressive partially calcified ligamentous thickening about the dens. Soft tissues and spinal canal: No prevertebral fluid or swelling. No visible canal hematoma. Disc levels: Moderate cervical disc and facet degeneration with disc bulging,  uncovertebral spurring, and facet spurring resulting in severe neural foraminal stenosis on the left at C3-4 and bilaterally at C6-7. Upper chest: Clear lung apices. Other: Moderate calcific atherosclerosis at the carotid bifurcations. IMPRESSION: 1. No evidence of acute intracranial abnormality. 2. 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 3. Moderate chronic small vessel ischemic disease. 4. No acute cervical spine fracture. Electronically Signed   By: Logan Bores M.D.   On: 09/26/2021 12:57   CT Cervical Spine Wo Contrast  Result Date: 09/26/2021 CLINICAL DATA:  Head trauma, mod-severe; Polytrauma, critical, head/C-spine injury suspected. Syncope. Neck pain. EXAM: CT HEAD WITHOUT CONTRAST CT CERVICAL SPINE WITHOUT CONTRAST TECHNIQUE: Multidetector CT imaging of the head and cervical spine was performed following the standard protocol without intravenous contrast. Multiplanar CT image reconstructions of the cervical spine were also generated. COMPARISON:  11/01/2018 FINDINGS: CT HEAD FINDINGS Brain: There is no evidence of an acute infarct, intracranial hemorrhage, mass, midline shift, or extra-axial fluid collection. Encephalomalacia inferiorly in the left greater than right frontal lobes and in the posterior right occipital lobe is unchanged and consistent with the sequelae of remote trauma. Hypodensities elsewhere in the cerebral white matter bilaterally are unchanged and nonspecific but compatible with moderate chronic small vessel ischemic disease. The ventricles are normal in size. Vascular: Calcified atherosclerosis at the skull base. No hyperdense vessel. Skull: No acute fracture or suspicious osseous lesion. Partially visualized remote maxillofacial fractures. Sinuses/Orbits: Visualized paranasal sinuses and mastoid air cells are clear. Bilateral cataract extraction. Chronically enlarged left globe with history of glaucoma. Other: None. CT CERVICAL SPINE  FINDINGS Alignment: Straightening of the normal cervical lordosis. No listhesis. Skull base and vertebrae: No acute fracture or suspicious osseous lesion. Ligamentum flavum ossification most notable on the right at C3-4. Prominent median C1-2 arthropathy with unchanged noncompressive partially calcified ligamentous thickening about the dens. Soft tissues and spinal canal: No prevertebral fluid or swelling. No visible canal hematoma. Disc levels: Moderate cervical disc and facet degeneration with disc bulging, uncovertebral spurring, and facet spurring resulting in severe neural foraminal stenosis on the left at C3-4 and bilaterally at C6-7. Upper chest: Clear lung apices. Other: Moderate calcific atherosclerosis at the carotid bifurcations. IMPRESSION: 1. No evidence of acute intracranial abnormality. 2. 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 3. Moderate chronic small vessel ischemic disease. 4. No acute cervical spine fracture. Electronically Signed   By: Logan Bores M.D.   On:  09/26/2021 12:57   CT HEAD CODE STROKE WO CONTRAST  Result Date: 10/23/2021 CLINICAL DATA:  Code stroke. Neuro deficit, acute, stroke suspected. Seizure. Right-sided gaze. No movement on the left. EXAM: CT HEAD WITHOUT CONTRAST TECHNIQUE: Contiguous axial images were obtained from the base of the skull through the vertex without intravenous contrast. COMPARISON:  CT head without contrast 09/26/2021 FINDINGS: Brain: Remote nonhemorrhagic lacunar infarct is present at the right globus pallidus. Mild atrophy and moderate diffuse white matter disease is again seen. A remote lacunar infarct of the right caudate head noted. No acute infarct, hemorrhage, or mass lesion is present. The ventricles are of normal size. No significant extraaxial fluid collection is present. The brainstem and cerebellum are within normal limits. Vascular: Atherosclerotic calcifications are present within the  cavernous internal carotid arteries. No hyperdense vessel is present. Skull: Calvarium is intact. No focal lytic or blastic lesions are present. No significant extracranial soft tissue lesion is present. Sinuses/Orbits: The paranasal sinuses and mastoid air cells are clear. Bilateral lens replacements are noted. The left globe is elongated, stable. Bilateral lens replacements noted. Globes and orbits are otherwise within normal limits. ASPECTS [facility] Stroke Program Early CT Score) - Ganglionic level infarction (caudate, lentiform nuclei, internal capsule, insula, M1-M3 cortex): 7/7 - Supraganglionic infarction (M4-M6 cortex): 3/3 Total score (0-10 with 10 being normal): 10/10 IMPRESSION: 1. No acute intracranial abnormality or significant interval change. 2. Stable atrophy and moderate diffuse white matter disease. This likely reflects the sequela of chronic microvascular ischemia. 3. 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 The above was relayed via text pager to Dr. Erlinda Hong on 10/23/2021 at 14:36 . Electronically Signed   By: San Morelle M.D.   On: 10/23/2021 14:37   CT ANGIO HEAD NECK W WO CM W PERF (CODE STROKE)  Result Date: 10/23/2021 CLINICAL DATA:  Stroke, follow-up. EXAM: CT ANGIOGRAPHY HEAD AND NECK CT PERFUSION BRAIN TECHNIQUE: Multidetector CT imaging of the head and neck was performed using the standard protocol during bolus administration of intravenous contrast. Multiplanar CT image reconstructions and MIPs were obtained to evaluate the vascular anatomy. Carotid stenosis measurements (when applicable) are obtained utilizing NASCET criteria, using the distal internal carotid diameter as the denominator. Multiphase CT imaging of the brain was performed following IV bolus contrast injection. Subsequent parametric perfusion maps were calculated using RAPID software. CONTRAST:  112mL OMNIPAQUE IOHEXOL 350 MG/ML SOLN COMPARISON:  CT head without contrast  10/23/2021 and 09/26/2021. FINDINGS: CTA NECK FINDINGS Aortic arch: A common origin of the left common carotid artery and innominate artery is noted. Atherosclerotic changes are noted at the aortic arch without focal stenosis. Right carotid system: The right common carotid artery is within normal limits. Atherosclerotic calcifications are present at the proximal right ICA without significant stenosis. Cervical right ICA is otherwise normal. Left carotid system: The left common carotid artery is within normal limits. Atherosclerotic disease is present in the proximal left ICA. Lumen is narrowed to 1.8 mm. This compares with a more distal lumen of 5.5 mm. Cervical left ICA is otherwise within normal limits. Vertebral arteries: The left vertebral artery is the dominant vessel. Both vertebral arteries originate from the subclavian arteries without significant stenosis. No significant stenosis or vascular injury is present to either vertebral artery in the neck. Skeleton: Multilevel degenerative changes are present cervical spine. No focal osseous lesion is present. Multiple dental caries and periodontal disease are present within the residual teeth. Other neck: Soft tissues the neck are otherwise  unremarkable. Salivary glands are within normal limits. Thyroid is normal. No significant adenopathy is present. No focal mucosal or submucosal lesions are present. Upper chest: Lung apices are clear. Esophagus is dilated. No focal lesion is present. Thoracic inlet is otherwise within normal limits. Review of the MIP images confirms the above findings CTA HEAD FINDINGS Anterior circulation: Dense atherosclerotic calcifications are present within the cavernous internal carotid arteries bilaterally. Bilateral high-grade stenoses are present proximal to the genu. The ICA termini are within normal limits. The A1 and M1 segments are normal. The anterior communicating artery is patent. MCA bifurcations are intact. The ACA and MCA  branch vessels are within normal limits. Posterior circulation: High-grade stenosis or occlusion of the right vertebral artery is noted at the dura. The vessel is reconstituted in the PICA origin is visualized. Distal V4 segment is stenotic. The left vertebral artery is the dominant vessel. PICA origin is visualized and normal. The basilar artery is within normal limits. The right posterior cerebral artery is of fetal type. The left posterior cerebral artery is of fetal type the right posterior cerebral artery originates from basilar tip. PCA branch vessels are within normal limits. Venous sinuses: The dural sinuses are patent. The straight sinus deep cerebral veins are intact. Cortical veins are within normal limits. No significant vascular malformation is evident. Anatomic variants: None Review of the MIP images confirms the above findings CT Brain Perfusion Findings: ASPECTS: 10/10 CBF (<30%) Volume: 69mL Perfusion (Tmax>6.0s) volume: 65mL Mismatch Volume: 18mL Infarction Location:N/a IMPRESSION: 1. CT perfusion demonstrates no acute infarct or significant ischemia. 2. High-grade stenosis of the proximal left internal carotid artery at the bifurcation, measuring to 1.8 mm. 3. Bilateral high-grade stenoses within the cavernous internal carotid arteries bilaterally. 4. High-grade stenosis or occlusion of the distal right vertebral artery at the dura. The vessel is reconstituted at the right PICA origin. 5. Aortic Atherosclerosis (ICD10-I70.0). Electronically Signed   By: San Morelle M.D.   On: 10/23/2021 14:52    Assessment: 70 y.o. male with PMH of HTN, PVD, congenital glaucoma, cataract, smoker, left frontal and occipital traumatic contusion with small left frontal SDH in 2019 presented to ED for right gaze and left sided weakness with questionable seizure activity reported by bystander. BP with high on the scene and in ER. In CT scanner, he had episode of GTC with eye deviated to the left. Received  ativan 2mg  and started keppra load. CT no bleeding. CTA head and neck no LVO and CTP neg. Currently pt sedated with post ictal.   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 Not sure if PRES due to HTN or benzo/alcohol withdraw. DDx including stroke but CTP negative, small stroke likely not causing recurrent GTC seizure, therefore, felt likelihood for stroke causing seizure is low. Do not feel TNK is needed. Will do stat MRI to rule out stroke. Will do LTM EEG. Continue keppra load to total 3g and then keppra 500 mg bid maintenance. Ativan PRN for seizure. Regular seizure work up including UA, alcohol level, UDS, CBC, CMP. Lower BP no more than 10-15% with first hour and no more than 25% within first day.   Plan: Stat MRI with and without LTM EEG after MRI Keppra load total 3g and Keppra 500mg  bid OK to admit to floor progressive unit if respiratory status stable. Otherwise, consider ICU admission or intubation. Ativan PRN for seizure.  Regular seizure work up including UA, alcohol level, UDS, CBC, CMP.  Lower BP no more than 10-15% with first hour and no more than 25% within first day.  Discussed with Dr. Dina Rich ED physician We will follow  Thank you for this consultation and allowing [place] to participate in the care of this patient.  Patient code stroke presented to ED, had active seizure in CT and medical decision was high complex to differentiate seizure with post ictal and todd's paralysis vs. Stroke with seizure vs. Large vessel occlusion with perfusion deficit. I spent extensive time about 110 min with the patient, more than 50% of which was spent in counseling and coordination of care, reviewing test results, images and medication, and discussing the diagnosis, treatment plan and potential prognosis. This patient's care requiresreview of multiple databases, neurological assessment, discussion with family, other specialists and medical decision  making of high complexity.    Rosalin Hawking, MD PhD Stroke Neurology 10/23/2021 3:46 PM

## 2021-10-23 NOTE — Progress Notes (Signed)
Notified by EDP Dr. Dina Rich that pt has episode of apnea and hypoxia. He was intubated in ER and put on propofol. Then he was noted to have seizure again. Received ativan. He had keppra 3g load already. I will order fosphenytoin 1.5g IV stat. Will do LTM EEG stat. Will use MRI compatible leads at this time. Will continue keppra 1.5g bid first dose tonight. Pt will be admitted to ICU. Further AED regimen will be based on EEG finding. Discussed with Dr. Dina Rich.  Rosalin Hawking, MD PhD Stroke Neurology 10/23/2021 4:35 PM

## 2021-10-23 NOTE — Progress Notes (Signed)
STAT EEG complete - results pending. ? ?

## 2021-10-23 NOTE — ED Notes (Signed)
Pt has another seizure episode with arms jerking movements. Versed IVP given. Will continue to monitor.

## 2021-10-23 NOTE — ED Notes (Signed)
Witnessed seizure episode while in CT scan table. Ativan 2mg  IVP given.

## 2021-10-23 NOTE — H&P (Addendum)
NAME:  Jeffrey Mullen, MRN:  [id number], DOB:  Jun 17, 1951, LOS: 0 ADMISSION DATE:  10/23/2021, CONSULTATION DATE:  10/31 REFERRING MD:  Dr. Dina Rich, CHIEF COMPLAINT:  AMS   History of Present Illness:  70 y/o M who presented to [facility] ER on 10/31 via EMS with reports of altered mental status.    The patient was at a grocery store.  He was seen walking around, later to be found lying on the ground with right sided gaze, altered and left sided weakness.  There were concerns for possible seizure. He was initially billed as a CODE STROKE. The patient was taken emergently to CT for imaging which was negative for LVO.  While in the [place], he had a witnessed seizure.  He was given 2mg  of ativan and seizure stopped.  However, he was sedate and having periods of apnea requiring intubation.  He was hypertensive and tachycardic.  Neurology initiated Keppra 3gm load.  The patient was started on propofol for sedation.  EEG, MRI pending.  Initial ABG 7.169 / 66 / 413 / 24. Initial labs - Na 144, K 3.9, cl 105, CO2 19, BUN 6, Sr Cr 1.46 (up from 0.9), AST 97, ALT 134, WBC 12.6, Hgb 13.3 and platelets 274.  Initial CXR was negative for infiltrate, ETT in good position.  After intubation, he had an additional seizure.   PCCM called for ICU admission.   Chart review shows he was seen in the ER on 10/4 after passing out at the [employer].  Physical exam was negative at the time.  Labs negative.  No further imaging obtained.   Pertinent  Medical History  Alcohol Use - unclear quantity Anxiety / Depression  Congenital blindness of left eye, cataract on R Hepatitis C+, treated in 2016 Seizures  Significant Hospital Events: Including procedures, antibiotic start and stop dates in addition to other pertinent events   10/31 Admit with AMS, seizure, hypertensive emergency, AKI  Interim History / Subjective:  Afebrile  On vent - 30%  On propofol 50 mcg, versed 10mg /hr  Objective   Blood pressure (!) 178/88,  pulse 92, temperature 98.1 F (36.7 C), temperature source Axillary, resp. rate 15, weight 79.2 kg, SpO2 99 %.        Intake/Output Summary (Last 24 hours) at 10/23/2021 1616 Last data filed at 10/23/2021 1517 Gross per 24 hour  Intake 300 ml  Output --  Net 300 ml   Filed Weights   10/23/21 1400  Weight: 79.2 kg    Examination: General: ill appearing adult male lying in bed in NAD HENT: ETT, MM moist, anicteric, left corneal cloudy, R sluggish but reactive to light Lungs: non-labored on vent, lungs bilaterally clear  Cardiovascular: S1S2 RRR, no m/r/g Abdomen: non-distended, bsx4 active  Extremities: warm/dry, no edema, fine tremor noted, ? shivering Neuro: sedate, cataract on right   Resolved Hospital Problem list     Assessment & Plan:   Acute Metabolic Encephalopathy  Hypertensive Emergency  Hx Frontal SDH LVO ruled out on CT.  Differential diagnosis for AMS include PRES, substance abuse/use or withdrawal, hypertensive emergency, hypercarbia and seizure/status epilepticus.   -admit to ICU -appreciate Neurology input  -now EEG -MRI pending  -seizure precautions  -assess ETOH, UDS  -continue Keppra, propofol, fosphenytoin + PRN ativan for suppression  -caution with BP reduction, not more than 25% in first 24 hours   Acute Hypercarbic Respiratory Failure Tobacco Abuse  -PRVC 8cc/kg -wean PEEP / fiO2 for sats >90% -follow intermittent  CXR -VAP prevention precautions  -follow up ABG  AKI  Baseline sr cr ~0.9 to 1  -Trend BMP / urinary output -Replace electrolytes as indicated -Avoid nephrotoxic agents, ensure adequate renal perfusion   Best Practice (right click and "Reselect all SmartList Selections" daily)  Diet/type: NPO DVT prophylaxis: LMWH GI prophylaxis: PPI Lines: N/A Foley:  N/A Code Status:  full code Last date of multidisciplinary goals of care discussion - pending  Labs   CBC: Recent Labs  Lab 10/23/21 1415 10/23/21 1417  10/23/21 1613  WBC 12.6*  --   --   NEUTROABS 7.5  --   --   HGB 13.3 15.0 13.6  HCT 42.3 44.0 40.0  MCV 90.2  --   --   PLT 274  --   --     Basic Metabolic Panel: Recent Labs  Lab 10/23/21 1415 10/23/21 1417 10/23/21 1613  NA 144 145 138  K 3.9 3.9 5.4*  CL 105 106  --   CO2 19*  --   --   GLUCOSE 180* 169*  --   BUN 6* 6*  --   CREATININE 1.46* 1.20  --   CALCIUM 9.6  --   --    GFR: Estimated Creatinine Clearance: 63.6 mL/min (by C-G formula based on SCr of 1.2 mg/dL). Recent Labs  Lab 10/23/21 1415  WBC 12.6*    Liver Function Tests: Recent Labs  Lab 10/23/21 1415  AST 97*  ALT 134*  ALKPHOS 137*  BILITOT 0.6  PROT 8.1  ALBUMIN 4.3   No results for input(s): LIPASE, AMYLASE in the last 168 hours. No results for input(s): AMMONIA in the last 168 hours.  ABG    Component Value Date/Time   PHART 7.169 (LL) 10/23/2021 1613   PCO2ART 66.3 (HH) 10/23/2021 1613   PO2ART 413 (H) 10/23/2021 1613   HCO3 24.1 10/23/2021 1613   TCO2 26 10/23/2021 1613   ACIDBASEDEF 6.0 (H) 10/23/2021 1613   O2SAT 100.0 10/23/2021 1613     Coagulation Profile: Recent Labs  Lab 10/23/21 1415  INR 1.1    Cardiac Enzymes: No results for input(s): CKTOTAL, CKMB, CKMBINDEX, TROPONINI in the last 168 hours.  HbA1C: Hgb A1c MFr Bld  Date/Time Value Ref Range Status  05/12/2021 09:21 AM 6.0 (H) 4.8 - 5.6 % Final    Comment:             Prediabetes: 5.7 - 6.4          Diabetes: >6.4          Glycemic control for adults with diabetes: <7.0     CBG: Recent Labs  Lab 10/23/21 1412  GLUCAP 166*    Review of Systems:   Unable to complete as patient is altered on mechanical ventilation.   Past Medical History:  He,  has a past medical history of Alcohol abuse, Allergy, Anemia, Anxiety, Arthritis, Blind left eye, Cataract, right eye, Congenital blindness, Depression, Glaucoma, bilateral, Hepatic cirrhosis ([place]), Hepatitis, HTN (hypertension), and Seizures ([place]).    Surgical History:   Past Surgical History:  Procedure Laterality Date   CATARACT EXTRACTION Left    GLAUCOMA SURGERY     Patient reports 10 or eleven surgeries for Glaucoma     Social History:   reports that he has been smoking cigarettes. He has been smoking an average of .25 packs per day. He has never used smokeless tobacco. He reports current alcohol use of about 5.0 standard drinks per week. He reports that  he does not use drugs.   Family History:  His family history includes Hearing loss in an other family member. There is no history of Kidney disease, Colon cancer, Esophageal cancer, Rectal cancer, or Stomach cancer.   Allergies Allergies  Allergen Reactions   Acetaminophen Other (See Comments)    Causes eye pressure to elevate; CANNOT HAVE THIS     Home Medications  Prior to Admission medications   Medication Sig Start Date End Date Taking? Authorizing Provider  amLODipine (NORVASC) 10 MG tablet Take 1 tablet (10 mg total) by mouth daily. 03/06/21   [name], NP  atorvastatin (LIPITOR) 40 MG tablet Take 1 tablet (40 mg total) by mouth daily. 01/24/21 01/24/22  Cherre Robins, MD  atropine 1 % ophthalmic solution Place 1 drop into the left eye 2 (two) times daily. 03/25/19   Elsie Stain, MD  bimatoprost (LUMIGAN) 0.01 % SOLN Place 1 drop into the right eye at bedtime.    [provider]  busPIRone (BUSPAR) 15 MG tablet TAKE ONE TABLET BY MOUTH THREE TIMES A DAY Patient taking differently: Take 15 mg by mouth 2 (two) times daily as needed (anxiety). 05/19/21   [name], NP  Cholecalciferol (VITAMIN D) 50 MCG (2000 UT) CAPS Take 2,000 Units by mouth daily.    [provider]  dorzolamide-timolol (COSOPT) 22.3-6.8 MG/ML ophthalmic solution Place 1 drop into the right eye 2 (two) times daily.    [provider]  ibuprofen (ADVIL) 800 MG tablet TAKE ONE TABLET BY MOUTH EVERY 8 HOURS AS NEEDED 10/09/21   [name], NP   lisinopril-hydrochlorothiazide (ZESTORETIC) 20-25 MG tablet Take 1 tablet by mouth daily. 03/06/21   [name], NP  loratadine (CLARITIN) 10 MG tablet Take 1 tablet (10 mg total) by mouth daily. Patient taking differently: Take 10 mg by mouth daily as needed for allergies. 05/12/21 09/26/21  [name], NP  mupirocin ointment (BACTROBAN) 2 % Apply 1 application topically 2 (two) times daily as needed (affected area(s)). 11/23/20   [provider]  OVER THE COUNTER MEDICATION Take 1 tablet by mouth daily. Vision formula with lutein.    [provider]  OVER THE COUNTER MEDICATION Take 1,200 mg by mouth 2 (two) times daily. Glucosamine chondroitin    [provider]  sildenafil (VIAGRA) 100 MG tablet Take 0.5-1 tablets (50-100 mg total) by mouth daily as needed for erectile dysfunction. 06/20/20 07/20/20  [name], NP  [facility] Phineas Inches) BAR Apply to affected area 05/12/21   [name], NP  tacrolimus (PROTOPIC) 0.1 % ointment APPLY TO FACE NIGHTLY 10/09/21   Lavonna Monarch, MD  triamcinolone (KENALOG) 0.025 % ointment APPLY ONE APPLICATION TOPICALLY TWICE A DAY Patient taking differently: Apply 1 application topically 2 (two) times daily as needed (rash). 09/29/20   [name], NP  vitamin C (ASCORBIC ACID) 500 MG tablet Take 500 mg by mouth daily.    [provider]     Critical care time: 34 minutes    Noe Gens, MSN, APRN, NP-C, AGACNP-BC [facility] 10/23/2021, 4:16 PM   Please see [email or web address] for pager details.   From 7A-7P if no response, please call [phone] After hours, please call ELink [phone]

## 2021-10-23 NOTE — ED Notes (Signed)
Pt observed having a seizure at this time. Dr.Horton at bedside.

## 2021-10-23 NOTE — Progress Notes (Signed)
ED MD requested ABG right after intubation. ABG was draw and critical results were given to Dr. Dina Rich. Per Dr. Dina Rich, RT increased RR to 20

## 2021-10-23 NOTE — Progress Notes (Addendum)
[facility] Progress Note Patient Name: Jeffrey Mullen DOB: 08-04-1951 MRN: [id number]   Date of Service  10/23/2021  HPI/Events of Note  Patient seen on camera. Sedated. Status epilepticus needing intubation. Stable vitals at this time on minimal vent support.   eICU Interventions  Continue vent support, ABG in AM Neuro following for seizures, on EEG DVT and GI prophylaxis as ordered Imaging per neuro team Call E link if needed     Intervention Category Major Interventions: Seizures - evaluation and management;Respiratory failure - evaluation and management Evaluation Type: New Patient Evaluation  Damon Baisch G Bates [place] 10/23/2021, 9:22 PM  Addendum at 6:20 am ABG noted Lower the RR to 16, continue other settings ABG at 8 am ordered

## 2021-10-23 NOTE — ED Notes (Signed)
Intubation started with Dr.Horton,RT and pharmacy at bedside. O2 requirements at bedside.

## 2021-10-23 NOTE — Progress Notes (Addendum)
Per CCM order, pt's Vt decreased from 8cc to 6cc (440). This RT will repeat ABG in 30 mins.

## 2021-10-23 NOTE — ED Triage Notes (Signed)
Walking in [employer] when bystanders witnessed pt collapsed on the floor. Seizure episode for a few minutes. Gaze to right side. No movement to left side. Pt was able to follow commands. HR 140. CBG 106. 74% on RA. Non rebreather at 99%.

## 2021-10-23 NOTE — ED Provider Notes (Addendum)
[facility] Provider Note   CSN: [id number] Arrival date & time: 10/23/21  1408     History Chief Complaint  Patient presents with   Code Stroke    Jeffrey Mullen is a 70 y.o. male.  HPI  70 year old male presents the emergency department as a code stroke.  History limited secondary to patient's altered mental status and patient being evaluated as a code stroke and EMS.  With neuro team at bedside.  Reported from EMS is that the patient was seen walking around normally the grocery store and then found on the ground.  Possible reported seizure activity prior to arrival.  When EMS arrived patient had a deviated stair to the right with hemiplegia.  Past Medical History:  Diagnosis Date   Alcohol abuse    Allergy    Anemia    Anxiety    Arthritis    Blind left eye    Cataract, right eye    Congenital blindness    LEFT EYE   Depression    Glaucoma, bilateral    Hepatic cirrhosis ([place])    Hepatitis    HEP C treated in 2016   HTN (hypertension)    Seizures ([place])    3 years ago    Patient Active Problem List   Diagnosis Date Noted   Cataract Right eye 12/12/2018   Glaucoma, congenital, blind Left eye  12/12/2018   Lives in homeless shelter 12/12/2018   Shoulder pain, right 12/12/2018   Tobacco abuse    ETOH abuse    Benign essential HTN    Hepatic cirrhosis ([place]) 02/10/2015   Chronic hepatitis C without hepatic coma ([place]) 01/05/2015    Past Surgical History:  Procedure Laterality Date   CATARACT EXTRACTION Left    GLAUCOMA SURGERY     Patient reports 68 or eleven surgeries for Glaucoma       Family History  Problem Relation Age of Onset   Hearing loss Other    Kidney disease Neg Hx    Colon cancer Neg Hx    Esophageal cancer Neg Hx    Rectal cancer Neg Hx    Stomach cancer Neg Hx     Social History   Tobacco Use   Smoking status: Some Days    Packs/day: 0.25    Types: Cigarettes   Smokeless tobacco:  Never  Vaping Use   Vaping Use: Never used  Substance Use Topics   Alcohol use: Yes    Alcohol/week: 5.0 standard drinks    Types: 5 Cans of beer per week    Comment: twice a month   Drug use: No    Home Medications Prior to Admission medications   Medication Sig Start Date End Date Taking? Authorizing Provider  amLODipine (NORVASC) 10 MG tablet Take 1 tablet (10 mg total) by mouth daily. 03/06/21   [name], NP  atorvastatin (LIPITOR) 40 MG tablet Take 1 tablet (40 mg total) by mouth daily. 01/24/21 01/24/22  Cherre Robins, MD  atropine 1 % ophthalmic solution Place 1 drop into the left eye 2 (two) times daily. 03/25/19   Elsie Stain, MD  bimatoprost (LUMIGAN) 0.01 % SOLN Place 1 drop into the right eye at bedtime.    [provider]  busPIRone (BUSPAR) 15 MG tablet TAKE ONE TABLET BY MOUTH THREE TIMES A DAY Patient taking differently: Take 15 mg by mouth 2 (two) times daily as needed (anxiety). 05/19/21   [name], NP  Cholecalciferol (VITAMIN D) 50 MCG (2000 UT) CAPS Take 2,000 Units by mouth daily.    [provider]  dorzolamide-timolol (COSOPT) 22.3-6.8 MG/ML ophthalmic solution Place 1 drop into the right eye 2 (two) times daily.    [provider]  ibuprofen (ADVIL) 800 MG tablet TAKE ONE TABLET BY MOUTH EVERY 8 HOURS AS NEEDED 10/09/21   [name], NP  lisinopril-hydrochlorothiazide (ZESTORETIC) 20-25 MG tablet Take 1 tablet by mouth daily. 03/06/21   [name], NP  loratadine (CLARITIN) 10 MG tablet Take 1 tablet (10 mg total) by mouth daily. Patient taking differently: Take 10 mg by mouth daily as needed for allergies. 05/12/21 09/26/21  [name], NP  mupirocin ointment (BACTROBAN) 2 % Apply 1 application topically 2 (two) times daily as needed (affected area(s)). 11/23/20   [provider]  OVER THE COUNTER MEDICATION Take 1 tablet by mouth daily. Vision formula with lutein.    [provider]   OVER THE COUNTER MEDICATION Take 1,200 mg by mouth 2 (two) times daily. Glucosamine chondroitin    [provider]  sildenafil (VIAGRA) 100 MG tablet Take 0.5-1 tablets (50-100 mg total) by mouth daily as needed for erectile dysfunction. 06/20/20 07/20/20  [name], NP  [place] [place] Phineas Inches) BAR Apply to affected area 05/12/21   [name], NP  tacrolimus (PROTOPIC) 0.1 % ointment APPLY TO FACE NIGHTLY 10/09/21   Lavonna Monarch, MD  triamcinolone (KENALOG) 0.025 % ointment APPLY ONE APPLICATION TOPICALLY TWICE A DAY Patient taking differently: Apply 1 application topically 2 (two) times daily as needed (rash). 09/29/20   [name], NP  vitamin C (ASCORBIC ACID) 500 MG tablet Take 500 mg by mouth daily.    [provider]    Allergies    Acetaminophen  Review of Systems   Review of Systems  Unable to perform ROS: Acuity of condition   Physical Exam Updated Vital Signs BP (!) 178/88   Pulse 92   Temp 98.1 F (36.7 C) (Axillary)   Resp 15   Wt 79.2 kg   SpO2 99%   BMI 24.35 kg/m   Physical Exam Constitutional:      Comments: Answers to name, eyes deviated  Eyes:     Comments: Left eye appears chronically discolored and blind  Cardiovascular:     Rate and Rhythm: Tachycardia present.     Comments: Hypertensive Musculoskeletal:        General: No deformity.  Neurological:     Comments: Eyes deviated to the right, hemiplegia    ED Results / Procedures / Treatments   Labs (all labs ordered are listed, but only abnormal results are displayed) Labs Reviewed  CBC - Abnormal; Notable for the following components:      Result Value   WBC 12.6 (*)    All other components within normal limits  DIFFERENTIAL - Abnormal; Notable for the following components:   Abs Immature Granulocytes 0.08 (*)    All other components within normal limits  COMPREHENSIVE METABOLIC PANEL - Abnormal; Notable for the following components:   CO2 19 (*)     Glucose, Bld 180 (*)    BUN 6 (*)    Creatinine, Ser 1.46 (*)    AST 97 (*)    ALT 134 (*)    Alkaline Phosphatase 137 (*)    GFR, Estimated 52 (*)    Anion gap 20 (*)    All other components within normal limits  URINALYSIS, ROUTINE W REFLEX MICROSCOPIC - Abnormal; Notable for the following components:   Protein, ur 100 (*)    Bacteria, UA RARE (*)    All other components within normal limits  I-STAT CHEM 8, ED - Abnormal; Notable for the following components:   BUN 6 (*)    Glucose, Bld 169 (*)    TCO2 21 (*)    All other components within normal limits  CBG MONITORING, ED - Abnormal; Notable for the following components:   Glucose-Capillary 166 (*)    All other components within normal limits  I-STAT ARTERIAL BLOOD GAS, ED - Abnormal; Notable for the following components:   pH, Arterial 7.169 (*)    pCO2 arterial 66.3 (*)    pO2, Arterial 413 (*)    Acid-base deficit 6.0 (*)    Potassium 5.4 (*)    All other components within normal limits  RESP PANEL BY RT-PCR (FLU A&B, COVID) ARPGX2  PROTIME-INR  APTT  RAPID URINE DRUG SCREEN, HOSP PERFORMED  ETHANOL    EKG EKG Interpretation  Date/Time:  Monday October 23 2021 14:50:11 EDT Ventricular Rate:  87 PR Interval:  180 QRS Duration: 82 QT Interval:  348 QTC Calculation: 418 R Axis:   70 Text Interpretation: Normal sinus rhythm with sinus arrhythmia Right atrial enlargement Nonspecific ST abnormality Abnormal ECG NSr, normal intervals Confirmed by Lavenia Atlas [phone]) on 10/23/2021 2:54:35 PM  Radiology DG Chest Portable 1 View  Result Date: 10/23/2021 CLINICAL DATA:  Seizure EXAM: PORTABLE CHEST 1 VIEW COMPARISON:  05/06/2019 FINDINGS: Heart and mediastinal shadows are normal. There is mild atelectasis at the lung bases. Upper lungs are clear. No evidence of heart failure or effusion. No acute bone finding. IMPRESSION: Mild atelectasis at the lung bases.  Otherwise no active process. Electronically Signed   By:  Nelson Chimes M.D.   On: 10/23/2021 15:40   CT HEAD CODE STROKE WO CONTRAST  Result Date: 10/23/2021 CLINICAL DATA:  Code stroke. Neuro deficit, acute, stroke suspected. Seizure. Right-sided gaze. No movement on the left. EXAM: CT HEAD WITHOUT CONTRAST TECHNIQUE: Contiguous axial images were obtained from the base of the skull through the vertex without intravenous contrast. COMPARISON:  CT head without contrast 09/26/2021 FINDINGS: Brain: Remote nonhemorrhagic lacunar infarct is present at the right globus pallidus. Mild atrophy and moderate diffuse white matter disease is again seen. A remote lacunar infarct of the right caudate head noted. No acute infarct, hemorrhage, or mass lesion is present. The ventricles are of normal size. No significant extraaxial fluid collection is present. The brainstem and cerebellum are within normal limits. Vascular: Atherosclerotic calcifications are present within the cavernous internal carotid arteries. No hyperdense vessel is present. Skull: Calvarium is intact. No focal lytic or blastic lesions are present. No significant extracranial soft tissue lesion is present. Sinuses/Orbits: The paranasal sinuses and mastoid air cells are clear. Bilateral lens replacements are noted. The left globe is elongated, stable. Bilateral lens replacements noted. Globes and orbits are otherwise within normal limits. ASPECTS [facility] Stroke Program Early CT Score) - Ganglionic level infarction (caudate, lentiform nuclei, internal capsule, insula, M1-M3 cortex): 7/7 - Supraganglionic infarction (M4-M6 cortex): 3/3 Total score (0-10 with 10 being normal): 10/10 IMPRESSION: 1. No acute intracranial abnormality or significant interval change. 2. Stable atrophy and moderate diffuse white matter disease. This likely reflects the sequela of chronic microvascular ischemia. 3. 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 The above was relayed via text pager to Dr. Erlinda Hong on  10/23/2021 at 14:36 . Electronically Signed   By: San Morelle M.D.   On: 10/23/2021 14:37   CT ANGIO HEAD NECK W WO CM W PERF (CODE STROKE)  Result Date: 10/23/2021 CLINICAL DATA:  Stroke, follow-up. EXAM: CT ANGIOGRAPHY HEAD AND NECK CT PERFUSION BRAIN TECHNIQUE: Multidetector CT imaging of the head and neck was performed using the standard protocol during bolus administration of intravenous contrast. Multiplanar CT image reconstructions and MIPs were obtained to evaluate the vascular anatomy. Carotid stenosis measurements (when applicable) are obtained utilizing NASCET criteria, using the distal internal carotid diameter as the denominator. Multiphase CT imaging of the brain was performed following IV bolus contrast injection. Subsequent parametric perfusion maps were calculated using RAPID software. CONTRAST:  188mL OMNIPAQUE IOHEXOL 350 MG/ML SOLN COMPARISON:  CT head without contrast 10/23/2021 and 09/26/2021. FINDINGS: CTA NECK FINDINGS Aortic arch: A common origin of the left common carotid artery and innominate artery is noted. Atherosclerotic changes are noted at the aortic arch without focal stenosis. Right carotid system: The right common carotid artery is within normal limits. Atherosclerotic calcifications are present at the proximal right ICA without significant stenosis. Cervical right ICA is otherwise normal. Left carotid system: The left common carotid artery is within normal limits. Atherosclerotic disease is present in the proximal left ICA. Lumen is narrowed to 1.8 mm. This compares with a more distal lumen of 5.5 mm. Cervical left ICA is otherwise within normal limits. Vertebral arteries: The left vertebral artery is the dominant vessel. Both vertebral arteries originate from the subclavian arteries without significant stenosis. No significant stenosis or vascular injury is present to either vertebral artery in the neck. Skeleton: Multilevel degenerative changes are present  cervical spine. No focal osseous lesion is present. Multiple dental caries and periodontal disease are present within the residual teeth. Other neck: Soft tissues the neck are otherwise unremarkable. Salivary glands are within normal limits. Thyroid is normal. No significant adenopathy is present. No focal mucosal or submucosal lesions are present. Upper chest: Lung apices are clear. Esophagus is dilated. No focal lesion is present. Thoracic inlet is otherwise within normal limits. Review of the MIP images confirms the above findings CTA HEAD FINDINGS Anterior circulation: Dense atherosclerotic calcifications are present within the cavernous internal carotid arteries bilaterally. Bilateral high-grade stenoses are present proximal to the genu. The ICA termini are within normal limits. The A1 and M1 segments are normal. The anterior communicating artery is patent. MCA bifurcations are intact. The ACA and MCA branch vessels are within normal limits. Posterior circulation: High-grade stenosis or occlusion of the right vertebral artery is noted at the dura. The vessel is reconstituted in the PICA origin is visualized. Distal V4 segment is stenotic. The left vertebral artery is the dominant vessel. PICA origin is visualized and normal. The basilar artery is within normal limits. The right posterior cerebral artery is of fetal type. The left posterior cerebral artery is of fetal type the right posterior cerebral artery originates from basilar tip. PCA branch vessels are within normal limits. Venous sinuses: The dural sinuses are patent. The straight sinus deep cerebral veins are intact. Cortical veins are within normal limits. No significant vascular malformation is evident. Anatomic variants: None Review of the MIP images confirms the above findings CT Brain Perfusion Findings: ASPECTS: 10/10 CBF (<30%) Volume: 78mL Perfusion (Tmax>6.0s) volume: 30mL Mismatch Volume: 25mL Infarction Location:N/a IMPRESSION: 1. CT perfusion  demonstrates no acute infarct or significant ischemia. 2. High-grade stenosis of the proximal left internal carotid artery at the bifurcation,  measuring to 1.8 mm. 3. Bilateral high-grade stenoses within the cavernous internal carotid arteries bilaterally. 4. High-grade stenosis or occlusion of the distal right vertebral artery at the dura. The vessel is reconstituted at the right PICA origin. 5. Aortic Atherosclerosis (ICD10-I70.0). Electronically Signed   By: San Morelle M.D.   On: 10/23/2021 14:52    Procedures .Critical Care Performed by: Lorelle Gibbs, DO Authorized by: Lorelle Gibbs, DO   Critical care provider statement:    Critical care time (minutes):  70   Critical care time was exclusive of:  Separately billable procedures and treating other patients   Critical care was necessary to treat or prevent imminent or life-threatening deterioration of the following conditions:  Respiratory failure and CNS failure or compromise   Critical care was time spent personally by me on the following activities:  Ordering and performing treatments and interventions, ordering and review of laboratory studies, re-evaluation of patient's condition, review of old charts, examination of patient, evaluation of patient's response to treatment, discussions with consultants and blood draw for specimens   I assumed direction of critical care for this patient from another provider in my specialty: no     Care discussed with: admitting provider   Procedure Name: Intubation Date/Time: 10/23/2021 4:26 PM Performed by: Lorelle Gibbs, DO Pre-anesthesia Checklist: Patient identified Oxygen Delivery Method: Ambu bag Preoxygenation: Pre-oxygenation with 100% oxygen Induction Type: IV induction and Rapid sequence Ventilation: Mask ventilation without difficulty Laryngoscope Size: Glidescope Grade View: Grade II Tube size: 7.5 mm Number of attempts: 1 Placement Confirmation: ETT inserted through  vocal cords under direct vision, Positive ETCO2, CO2 detector and Breath sounds checked- equal and bilateral Secured at: 24 cm Tube secured with: ETT holder      Medications Ordered in ED Medications  0.9 %  sodium chloride infusion ( Intravenous New Bag/Given 10/23/21 1605)  pantoprazole (PROTONIX) injection 40 mg (40 mg Intravenous Given 10/23/21 1605)  propofol (DIPRIVAN) 1000 MG/100ML infusion (25 mcg/kg/min  79.2 kg Intravenous New Bag/Given 10/23/21 1610)  LORazepam (ATIVAN) 2 MG/ML injection (has no administration in time range)  sodium chloride flush (NS) 0.9 % injection 3 mL (3 mLs Intravenous Given 10/23/21 1432)  levETIRAcetam (KEPPRA) IVPB 1000 mg/100 mL premix (0 mg Intravenous Stopped 10/23/21 1517)  LORazepam (ATIVAN) injection 2 mg (2 mg Intravenous Given 10/23/21 1432)  iohexol (OMNIPAQUE) 350 MG/ML injection 100 mL (100 mLs Intravenous Contrast Given 10/23/21 1439)  labetalol (NORMODYNE) injection 10 mg (10 mg Intravenous Given 10/23/21 1457)  succinylcholine (ANECTINE) syringe 100 mg (100 mg Intravenous Given 10/23/21 1554)  etomidate (AMIDATE) injection 8 mg (8 mg Intravenous Given 10/23/21 1554)    ED Course  I have reviewed the triage vital signs and the nursing notes.  Pertinent labs & imaging results that were available during my care of the patient were reviewed by me and considered in my medical decision making (see chart for details).    MDM Rules/Calculators/A&P                           70 year old male presents emergency department as a code stroke.  He is minimally verbal, eyes deviated to the right hemiplegic.  Possible reported seizure activity.  Patient evaluated as a code stroke in the EMS bridge of the neuro team at bedside.  Level 5 caveat.  While in the CT scanner patient had a minute long generalized tonic-clonic seizure.  2 mg of Ativan given and 3 g  of Keppra load ordered by neurology.  Seizure subsided.  Stroke imaging does not appear to  show an LVO or signs of perfusion deficit.  Plan for EEG and MRI.  When patient got back to his room he was having periods of apnea with hypoxia.  He is nonverbal, does not respond to strong sternal stimuli.  Decision made to intubate the patient.  He was intubated without difficulty.  After intubation patient started having seizure-like activity again.  Neurology Dr. Erlinda Hong aware. He was again given Ativan IV and Versed drip and fosphenytoin load recommended by neurology which was ordered.  Pharmacy at bedside.  ICU consulted and will admit the patient.  On reevaluation seizure activity has stopped, blood pressure is stable.  Patients evaluation and results requires admission for further treatment and care. Patient agrees with admission plan, offers no new complaints and is stable/unchanged at time of admit.  Final Clinical Impression(s) / ED Diagnoses Final diagnoses:  Seizure [facility])    Rx / DC Orders ED Discharge Orders     None        Lorelle Gibbs, DO 10/23/21 1627    Lorelle Gibbs, DO 10/23/21 1628

## 2021-10-23 NOTE — Progress Notes (Signed)
VLTM started all imp below 10kohms  Atrium to moniter   Pt event button tested

## 2021-10-23 NOTE — ED Notes (Signed)
Pt observed with tremors to both arms again. Versed IVP given for seizures. Seizures stopped after giving bolus Versed. Will continue to monitor.

## 2021-10-24 ENCOUNTER — Inpatient Hospital Stay (HOSPITAL_COMMUNITY): Payer: Medicare Other

## 2021-10-24 DIAGNOSIS — G40901 Epilepsy, unspecified, not intractable, with status epilepticus: Secondary | ICD-10-CM | POA: Diagnosis not present

## 2021-10-24 DIAGNOSIS — R569 Unspecified convulsions: Secondary | ICD-10-CM | POA: Diagnosis not present

## 2021-10-24 LAB — POCT I-STAT 7, (LYTES, BLD GAS, ICA,H+H)
Acid-Base Excess: 1 mmol/L (ref 0.0–2.0)
Acid-Base Excess: 0 mmol/L (ref 0.0–2.0)
Bicarbonate: 22.9 mmol/L (ref 20.0–28.0)
Bicarbonate: 23.8 mmol/L (ref 20.0–28.0)
Calcium, Ion: 1.11 mmol/L — ABNORMAL LOW (ref 1.15–1.40)
Calcium, Ion: 1.12 mmol/L — ABNORMAL LOW (ref 1.15–1.40)
HCT: 32 % — ABNORMAL LOW (ref 39.0–52.0)
HCT: 35 % — ABNORMAL LOW (ref 39.0–52.0)
Hemoglobin: 10.9 g/dL — ABNORMAL LOW (ref 13.0–17.0)
Hemoglobin: 11.9 g/dL — ABNORMAL LOW (ref 13.0–17.0)
O2 Saturation: 98 %
O2 Saturation: 99 %
Patient temperature: 98.8
Patient temperature: 97.3
Potassium: 3 mmol/L — ABNORMAL LOW (ref 3.5–5.1)
Potassium: 3.1 mmol/L — ABNORMAL LOW (ref 3.5–5.1)
Sodium: 143 mmol/L (ref 135–145)
Sodium: 145 mmol/L (ref 135–145)
TCO2: 24 mmol/L (ref 22–32)
TCO2: 25 mmol/L (ref 22–32)
pCO2 arterial: 27.7 mmHg — ABNORMAL LOW (ref 32.0–48.0)
pCO2 arterial: 32.4 mmHg (ref 32.0–48.0)
pH, Arterial: 7.525 — ABNORMAL HIGH (ref 7.350–7.450)
pH, Arterial: 7.471 — ABNORMAL HIGH (ref 7.350–7.450)
pO2, Arterial: 90 mmHg (ref 83.0–108.0)
pO2, Arterial: 114 mmHg — ABNORMAL HIGH (ref 83.0–108.0)

## 2021-10-24 LAB — BASIC METABOLIC PANEL
Anion gap: 9 (ref 5–15)
Anion gap: 9 (ref 5–15)
BUN: <5 mg/dL — ABNORMAL LOW (ref 8–23)
BUN: 5 mg/dL — ABNORMAL LOW (ref 8–23)
CO2: 22 mmol/L (ref 22–32)
CO2: 19 mmol/L — ABNORMAL LOW (ref 22–32)
Calcium: 8.4 mg/dL — ABNORMAL LOW (ref 8.9–10.3)
Calcium: 8.3 mg/dL — ABNORMAL LOW (ref 8.9–10.3)
Chloride: 109 mmol/L (ref 98–111)
Chloride: 116 mmol/L — ABNORMAL HIGH (ref 98–111)
Creatinine, Ser: 1.02 mg/dL (ref 0.61–1.24)
Creatinine, Ser: 1.08 mg/dL (ref 0.61–1.24)
GFR, Estimated: >60 mL/min (ref >60)
GFR, Estimated: >60 mL/min (ref >60)
Glucose, Bld: 93 mg/dL (ref 70–99)
Glucose, Bld: 80 mg/dL (ref 70–99)
Potassium: 3 mmol/L — ABNORMAL LOW (ref 3.5–5.1)
Potassium: 5.7 mmol/L — ABNORMAL HIGH (ref 3.5–5.1)
Sodium: 140 mmol/L (ref 135–145)
Sodium: 144 mmol/L (ref 135–145)

## 2021-10-24 LAB — PHOSPHORUS
Phosphorus: 3.3 mg/dL (ref 2.5–4.6)
Phosphorus: 3.9 mg/dL (ref 2.5–4.6)

## 2021-10-24 LAB — CBC
HCT: 30.7 % — ABNORMAL LOW (ref 39.0–52.0)
Hemoglobin: 10.8 g/dL — ABNORMAL LOW (ref 13.0–17.0)
MCH: 28.3 pg (ref 26.0–34.0)
MCHC: 35.2 g/dL (ref 30.0–36.0)
MCV: 80.6 fL (ref 80.0–100.0)
Platelets: 199 10*3/uL (ref 150–400)
RBC: 3.81 MIL/uL — ABNORMAL LOW (ref 4.22–5.81)
RDW: 14.6 % (ref 11.5–15.5)
WBC: 8.2 10*3/uL (ref 4.0–10.5)
nRBC: 0 % (ref 0.0–0.2)

## 2021-10-24 LAB — HIV ANTIBODY (ROUTINE TESTING W REFLEX): HIV Screen 4th Generation wRfx: NONREACTIVE

## 2021-10-24 LAB — MAGNESIUM
Magnesium: 1.8 mg/dL (ref 1.7–2.4)
Magnesium: 2.7 mg/dL — ABNORMAL HIGH (ref 1.7–2.4)

## 2021-10-24 LAB — MRSA NEXT GEN BY PCR, NASAL: MRSA by PCR Next Gen: NOT DETECTED

## 2021-10-24 MED ORDER — POTASSIUM CHLORIDE 10 MEQ/100ML IV SOLN: 10.0000 meq | INTRAVENOUS | Status: DC

## 2021-10-24 MED ORDER — SODIUM CHLORIDE 0.9 % IV SOLN
100.0000 mg | Freq: Two times a day (BID) | INTRAVENOUS | Status: DC
  Administered 2021-10-25: 100 mg via INTRAVENOUS
  Filled 2021-10-24 (×2): qty 10

## 2021-10-24 MED ORDER — THIAMINE HCL 100 MG/ML IJ SOLN
100.0000 mg | Freq: Every day | INTRAMUSCULAR | Status: DC
  Administered 2021-11-01 – 2021-11-05 (×5): 100 mg via INTRAVENOUS
  Filled 2021-10-24 (×2): qty 2
  Filled 2021-10-24: qty 1
  Filled 2021-10-24 (×3): qty 2

## 2021-10-24 MED ORDER — THIAMINE HCL 100 MG/ML IJ SOLN
500.0000 mg | Freq: Three times a day (TID) | INTRAVENOUS | Status: AC
  Administered 2021-10-24 – 2021-10-26 (×6): 500 mg via INTRAVENOUS
  Filled 2021-10-24 (×6): qty 5

## 2021-10-24 MED ORDER — SODIUM CHLORIDE 0.9 % IV SOLN: INTRAVENOUS | Status: DC | PRN

## 2021-10-24 MED ORDER — DEXTROSE 5 % IV SOLN
10.0000 mg/kg | Freq: Three times a day (TID) | INTRAVENOUS | Status: DC
  Administered 2021-10-24 – 2021-10-26 (×5): 795 mg via INTRAVENOUS
  Filled 2021-10-24 (×9): qty 15.9

## 2021-10-24 MED ORDER — SODIUM CHLORIDE 0.9 % IV SOLN: INTRAVENOUS | Status: DC

## 2021-10-24 MED ORDER — MAGNESIUM SULFATE 2 GM/50ML IV SOLN
2.0000 g | Freq: Once | INTRAVENOUS | Status: AC
  Administered 2021-10-24: 2 g via INTRAVENOUS
  Filled 2021-10-24: qty 50

## 2021-10-24 MED ORDER — SODIUM CHLORIDE 0.9 % IV SOLN
200.0000 mg | Freq: Once | INTRAVENOUS | Status: AC
  Administered 2021-10-24: 200 mg via INTRAVENOUS
  Filled 2021-10-24: qty 20

## 2021-10-24 MED ORDER — CHLORHEXIDINE GLUCONATE CLOTH 2 % EX PADS
6.0000 | MEDICATED_PAD | Freq: Every day | CUTANEOUS | Status: DC
  Administered 2021-10-24 – 2021-11-22 (×28): 6 via TOPICAL

## 2021-10-24 MED ORDER — THIAMINE HCL 100 MG/ML IJ SOLN
250.0000 mg | Freq: Every day | INTRAVENOUS | Status: AC
  Administered 2021-10-26 – 2021-10-31 (×6): 250 mg via INTRAVENOUS
  Filled 2021-10-24 (×6): qty 2.5

## 2021-10-24 MED ORDER — ENOXAPARIN SODIUM 40 MG/0.4ML IJ SOSY: 40.0000 mg | PREFILLED_SYRINGE | INTRAMUSCULAR | Status: DC

## 2021-10-24 MED ORDER — POTASSIUM CHLORIDE 20 MEQ PO PACK
40.0000 meq | PACK | ORAL | Status: AC
  Administered 2021-10-24 (×2): 40 meq
  Filled 2021-10-24 (×2): qty 2

## 2021-10-24 MED ORDER — POTASSIUM CHLORIDE 20 MEQ PO PACK
20.0000 meq | PACK | ORAL | Status: DC
Start: 2021-10-24 — End: 2021-10-24

## 2021-10-24 MED ORDER — GADOBUTROL 1 MMOL/ML IV SOLN
8.0000 mL | Freq: Once | INTRAVENOUS | Status: AC | PRN
  Administered 2021-10-24: 8 mL via INTRAVENOUS

## 2021-10-24 NOTE — Progress Notes (Signed)
Attempted LP x 3, unsuccessful, night team may try or I may try with [place] in morning.  Erskine Emery MD PCCM

## 2021-10-24 NOTE — Progress Notes (Signed)
Pharmacy Antibiotic Note  Jeffrey Mullen is a 70 y.o. 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  Pharmacy has been consulted for acyclovir dosing. SCr down to 1.02.  Plan: Acyclovir 795mg  (10mg /kg) IV q8h Increase NS to 125 ml/hr Monitor clinical progress, c/s, renal function, fluid status F/u Neurology plans, LOT   Height: 5\' 10"  (177.8 cm) Weight: 79.3 kg (174 lb 13.2 oz) IBW/kg (Calculated) : 73  Temp (24hrs), Avg:98.3 F (36.8 C), Min:97.3 F (36.3 C), Max:99.2 F (37.3 C)  Recent Labs  Lab 10/23/21 1415 10/23/21 1417 10/24/21 0615  WBC 12.6*  --  8.2  CREATININE 1.46* 1.20 1.02    Estimated Creatinine Clearance: 72.6 mL/min (by C-G formula based on SCr of 1.02 mg/dL).    Allergies  Allergen Reactions   Acetaminophen Other (See Comments)    Causes eye pressure to elevate; CANNOT HAVE THIS    Arturo Morton, PharmD, BCPS Please check AMION for all [place] contact numbers Clinical Pharmacist 10/24/2021 1:46 PM

## 2021-10-24 NOTE — Progress Notes (Signed)
LTM maint complete - no skin breakdown under:  FP1 FP2 

## 2021-10-24 NOTE — Progress Notes (Signed)
NAME:  Jeffrey Mullen, MRN:  [id number], DOB:  November 22, 1951, LOS: 1 ADMISSION DATE:  10/23/2021, CONSULTATION DATE:  10/31 REFERRING MD:  Dr. Dina Rich, CHIEF COMPLAINT:  AMS   History of Present Illness:  70 y/o M who presented to [facility] ER on 10/31 via EMS with reports of altered mental status.    The patient was at a grocery store.  He was seen walking around, later to be found lying on the ground with right sided gaze, altered and left sided weakness.  There were concerns for possible seizure. He was initially billed as a CODE STROKE. The patient was taken emergently to CT for imaging which was negative for LVO.  While in the [place], he had a witnessed seizure.  He was given 2mg  of ativan and seizure stopped.  However, he was sedate and having periods of apnea requiring intubation.  He was hypertensive and tachycardic.  Neurology initiated Keppra 3gm load.  The patient was started on propofol for sedation.  EEG, MRI pending.  Initial ABG 7.169 / 66 / 413 / 24. Initial labs - Na 144, K 3.9, cl 105, CO2 19, BUN 6, Sr Cr 1.46 (up from 0.9), AST 97, ALT 134, WBC 12.6, Hgb 13.3 and platelets 274.  Initial CXR was negative for infiltrate, ETT in good position.  After intubation, he had an additional seizure.   PCCM called for ICU admission.   Chart review shows he was seen in the ER on 10/4 after passing out at the [employer].  Physical exam was negative at the time.  Labs negative.  No further imaging obtained.   Pertinent  Medical History  Alcohol Use - unclear quantity Anxiety / Depression  Congenital blindness of left eye, cataract on R Hepatitis C+, treated in 2016 Seizures  Significant Hospital Events: Including procedures, antibiotic start and stop dates in addition to other pertinent events   10/31 Admit with AMS, seizure, hypertensive emergency, AKI  Interim History / Subjective:  Heavily sedated this AM. On cEEG. On vent.  Objective   Blood pressure (!) 110/59, pulse 67,  temperature (!) 97.3 F (36.3 C), temperature source Axillary, resp. rate 16, height 5\' 10"  (1.778 m), weight 79.3 kg, SpO2 99 %.    Vent Mode: PRVC FiO2 (%):  [30 %] 30 % Set Rate:  [16 bmp-20 bmp] 16 bmp Vt Set:  [440 mL-580 mL] 440 mL PEEP:  [5 cmH20] 5 cmH20 Plateau Pressure:  [12 CBS49-67 cmH20] 13 cmH20   Intake/Output Summary (Last 24 hours) at 10/24/2021 1254 Last data filed at 10/24/2021 1000 Gross per 24 hour  Intake 4420.01 ml  Output 3100 ml  Net 1320.01 ml    Filed Weights   10/23/21 1400 10/24/21 0500  Weight: 79.2 kg 79.3 kg    Examination: No distress on vent L eye cataract noted RASS -5 Not moving anything with sedation wean per RN No edema Heart and lung sounds normal Neurology to wean prop further  HypoK being repleted  Resolved Hospital Problem list     Assessment & Plan:   Acute Metabolic Encephalopathy Hx Alcohol abuse Question Status epilepticus, question postictal Hypertensive Emergency r/o PRES Hx Frontal SDH LVO ruled out on CT.  UDS neg.  MRI pending.  Ongoing cEEG. - cEEG, sedation titration, BP titration per neurology - eventual MRI, question LP  Acute Hypercarbic Respiratory Failure Tobacco Abuse  - Vent bundle - Mental status precludes extubation at present  AKI  Baseline sr cr ~0.9 to 1  -  Trend BMP / urinary output -Replace electrolytes as indicated -Avoid nephrotoxic agents, ensure adequate renal perfusion   Best Practice (right click and "Reselect all SmartList Selections" daily)  Diet/type: NPO DVT prophylaxis: LMWH GI prophylaxis: PPI Lines: N/A Foley:  N/A Code Status:  full code Last date of multidisciplinary goals of care discussion - pending, complex family dynamics per [place] RN note today at 11:40AM   Patient critically ill due to resp failure Interventions to address this today vent titration Risk of deterioration without these interventions is high  I personally spent 34 minutes providing critical  care not including any separately billable procedures  Erskine Emery MD [facility]  Prefer epic messenger for cross cover needs If after hours, please call E-link

## 2021-10-24 NOTE — Progress Notes (Signed)
Pt transported on vent from 4N21 to MRI and back without any complications. Vitals stable, RN at bedside, RT will continue to monitor.

## 2021-10-24 NOTE — Progress Notes (Signed)
Called daughter listed in chart, per conversation they are very estranged and she is estranged from the rest of the family as well. She is unaware of any sibling or family member that may be able to make decisions for him in the event he couldn't make decisions for himself. She feels uncomfortable making any decisions due to them having no relationship. I told her to please call back if she thinks of someone who may want to be aware of patients condition.

## 2021-10-24 NOTE — Progress Notes (Addendum)
Neurology Progress Note   S:// Seen and examined.  No seizure activity overnight clinically. LTM read from overnight- showed 2 seizures from right hemisphere and right sided PLEDs  O:// Current vital signs: BP 122/67   Pulse 71   Temp 98.8 F (37.1 C) (Axillary)   Resp 16   Ht '5\' 10"'  (1.778 m)   Wt 79.3 kg   SpO2 99%   BMI 25.08 kg/m  Vital signs in last 24 hours: Temp:  [98.1 F (36.7 C)-99.2 F (37.3 C)] 98.8 F (37.1 C) (11/01 0400) Pulse Rate:  [69-125] 71 (11/01 0748) Resp:  [15-25] 16 (11/01 0748) BP: (74-226)/(51-107) 122/67 (11/01 0748) SpO2:  [93 %-100 %] 99 % (11/01 0748) FiO2 (%):  [30 %] 30 % (11/01 0748) Weight:  [79.2 kg-79.3 kg] 79.3 kg (11/01 0500) General: Sedated on propofol and Versed drips, intubated HEENT: Normocephalic/atraumatic CVs: Regular rate rhythm Respiratory: Vented Abdomen nondistended nontender Extremities warm well perfused Neurological exam Sedated intubated-on propofol and Versed drips Propofol is held for 10 minutes prior to examination. No spontaneous movements No response to noxious stimulation Remains nonverbal Cranial nerves: Right pupil is pinpoint and sluggishly reactive, left cornea is hazy-cannot visualize pupil, gaze appears midline, does not blink to threat from either side, facial symmetry difficult to ascertain due to endotracheal tube in place. Motor exam: No spontaneous movement, no withdrawal to noxious stimulation in all fours Sensory exam: As above Coordination cannot be assessed given his current mentation  Medications  Current Facility-Administered Medications:    0.9 %  sodium chloride infusion, , Intravenous, Continuous, Rosalin Hawking, MD, Last Rate: 75 mL/hr at 10/23/21 1605, New Bag at 10/23/21 1605   0.9 %  sodium chloride infusion, 250 mL, Intravenous, Continuous, Elsworth Soho, Leanna Sato, MD   chlorhexidine gluconate (MEDLINE KIT) (PERIDEX) 0.12 % solution 15 mL, 15 mL, Mouth Rinse, BID, Rigoberto Noel, MD, 15 mL at  10/23/21 2333   Chlorhexidine Gluconate Cloth 2 % PADS 6 each, 6 each, Topical, Daily, Rigoberto Noel, MD   docusate sodium (COLACE) capsule 100 mg, 100 mg, Oral, BID PRN, Rigoberto Noel, MD   enoxaparin (LOVENOX) injection 40 mg, 40 mg, Subcutaneous, Q24H, Rigoberto Noel, MD   levETIRAcetam (KEPPRA) IVPB 1500 mg/ 100 mL premix, 1,500 mg, Intravenous, Q12H, Rosalin Hawking, MD, Stopped at 10/23/21 2144   MEDLINE mouth rinse, 15 mL, Mouth Rinse, 10 times per day, Rigoberto Noel, MD, 15 mL at 10/24/21 0638   midazolam PF (VERSED) injection 15 mg, 15 mg, Intravenous, Q5 min PRN **AND** midazolam (VERSED) bolus via infusion 15 mg, 15 mg, Intravenous, Q5 min PRN, 15 mg at 10/23/21 1902 **AND** midazolam (VERSED) 100 mg/100 mL (1 mg/mL) premix infusion, 10 mg/hr, Intravenous, Continuous, Horton, Kristie M, DO, Last Rate: 10 mL/hr at 10/24/21 0606, 10 mg/hr at 10/24/21 0606   norepinephrine (LEVOPHED) 19m in 2565mpremix infusion, 2-10 mcg/min, Intravenous, Titrated, AlRigoberto NoelMD   pantoprazole (PROTONIX) injection 40 mg, 40 mg, Intravenous, Q24H, XuRosalin HawkingMD, 40 mg at 10/23/21 1605   polyethylene glycol (MIRALAX / GLYCOLAX) packet 17 g, 17 g, Oral, Daily PRN, AlRigoberto NoelMD   propofol (DIPRIVAN) 1000 MG/100ML infusion, 5-80 mcg/kg/min, Intravenous, Continuous, Horton, Kristie M, DO, Last Rate: 19.01 mL/hr at 10/24/21 0526, 40 mcg/kg/min at 10/24/21 0526 Labs CBC    Component Value Date/Time   WBC 8.2 10/24/2021 0615   RBC 3.81 (L) 10/24/2021 0615   HGB 10.9 (L) 10/24/2021 0618   HGB 12.9 (L) 05/12/2021  0921   HCT 32.0 (L) 10/24/2021 0618   HCT 38.7 05/12/2021 0921   PLT 199 10/24/2021 0615   PLT 295 05/12/2021 0921   MCV 80.6 10/24/2021 0615   MCV 86 05/12/2021 0921   MCH 28.3 10/24/2021 0615   MCHC 35.2 10/24/2021 0615   RDW 14.6 10/24/2021 0615   RDW 15.8 (H) 05/12/2021 0921   LYMPHSABS 4.0 10/23/2021 1415   MONOABS 0.9 10/23/2021 1415   EOSABS 0.0 10/23/2021 1415   BASOSABS 0.0  10/23/2021 1415    CMP     Component Value Date/Time   NA 143 10/24/2021 0618   NA 139 05/12/2021 0921   K 3.0 (L) 10/24/2021 0618   CL 109 10/24/2021 0615   CO2 22 10/24/2021 0615   GLUCOSE 93 10/24/2021 0615   BUN <5 (L) 10/24/2021 0615   BUN 10 05/12/2021 0921   CREATININE 1.02 10/24/2021 0615   CREATININE 1.01 12/09/2014 0926   CALCIUM 8.4 (L) 10/24/2021 0615   PROT 8.1 10/23/2021 1415   PROT 7.8 05/12/2021 0921   ALBUMIN 4.3 10/23/2021 1415   ALBUMIN 4.9 (H) 05/12/2021 0921   AST 97 (H) 10/23/2021 1415   ALT 134 (H) 10/23/2021 1415   ALKPHOS 137 (H) 10/23/2021 1415   BILITOT 0.6 10/23/2021 1415   BILITOT 0.2 05/12/2021 0921   GFRNONAA >60 10/24/2021 0615   GFRAA 80 12/07/2020 1529   UDS negative on arrival EtOH undetectable on arrival  Spot EEG yesterday on arrival-demonstrated background suppression that was generalized and intermittent slowing that was also generalized suggestive of profound diffuse encephalopathy-nonspecific but likely related to sedation.  No seizures or epileptiform discharges seen throughout recording.  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  CT perfusion with no acute infarction or perfusion deficit.  Assessment: 70 year old man with history of hypertension, peripheral vascular disease,  congenital truck,, cataract, tobacco abuse, left frontal and occipital traumatic contusion with small left frontal SDH in 2019, history of alcohol abuse who presented as a code stroke for right gaze and left-sided weakness with questionable seizure activity reported by bystander with blood pressures elevated on scene and in the ER with repeat generalized tonic-clonic seizure with eye deviation to the left x2 in the emergency room for which she received Ativan and Keppra load and after the second episode also loaded with fosphenytoin. Currently on Keppra, IV propofol and IV midazolam drip and hooked up to continuous EEG. LTM EEG from overnight with 2 seizures emanating from the right side and a right-sided PLEDs.  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    Impression: Seizures (has been evaluated by EEG in 2019 for possible syncope but no docuemted h/o seizures in the past), status epilepticus with ensuing Todd's paralysis 2 electrographic seizures overnight PLEDs Evaluate for stroke due to presenting with stroke like symptoms Hypertensive emergency  Recommendations: MRI brain with and without contrast as soon as  possible-has MRI safe leads-spoke with the RN who is coordinating. After the MRI, recommend LP with glucose, protein, cell count, gram stain and HSV PCR. For now start acyclovir empirically for HSV encephalitis-pharmacy consult requested High dose Thiamine given ETOH history Continue current dose of Keppra Will add Vimpat to his antiepileptic regimen given that he had seizures on Keppra. EEG discussed with Dr. Hortense Ramal and plan discussed with Dr. Tamala Julian from critical care.  -- Amie Portland, MD Neurologist [facility] Pager: [phone]  CRITICAL CARE ATTESTATION Performed by: Amie Portland, MD Total critical care time: 39 minutes Critical care time was exclusive of separately billable procedures and treating other patients and/or supervising  APPs/Residents/Students Critical care was necessary to treat or prevent imminent or life-threatening deterioration due to seizures, htn emergency  This patient is critically ill and at significant risk for neurological worsening and/or death and care requires constant monitoring. Critical care was time spent personally by me on the following activities: development of treatment plan with patient and/or surrogate as well as nursing, discussions with consultants, evaluation of patient's response to treatment, examination of patient, obtaining history from patient or surrogate, ordering and performing treatments and interventions, ordering and review of laboratory studies, ordering and review of radiographic studies, pulse oximetry, re-evaluation of patient's condition, participation in multidisciplinary rounds and medical decision making of high complexity in the care of this patient.

## 2021-10-24 NOTE — Procedures (Addendum)
Patient Name: Jeffrey Mullen  MRN: [id number]  Epilepsy Attending: Lora Havens  Referring Physician/Provider: Dr Rosalin Hawking Duration: 10/23/2021 1806 to 10/24/2021 1646   Patient history: 70 y.o. male with PMH of HTN, PVD, congenital glaucoma, cataract, smoker, left frontal and occipital traumatic contusion with small left frontal SDH in 2019 presented to ED for right gaze and left sided weakness with questionable seizure activity. EEG to evaluate for seizure   Level of alertness: comatose   AEDs during EEG study: LEV, Propofol, Versed   Technical aspects: This EEG study was done with scalp electrodes positioned according to the 10-20 International system of electrode placement. Electrical activity was acquired at a sampling rate of 500Hz  and reviewed with a high frequency filter of 70Hz  and a low frequency filter of 1Hz . EEG data were recorded continuously and digitally stored.    Description: EEG showed generalized suppression alternating with 2-3 seconds of generalized low amplitude 6-9hz  theta-alpha activity every 3 to 6 seconds.  Lateralized periodic discharges with overlying fast activity was also noticed in the right hemisphere maximal right anterior temporal region at 1 Hz. Two seizures were noted without clinical signs on 10/24/2021 at 0726 and 0813, arising from right anterior temporal region, lasting about 1 minute each.  Hyperventilation and photic stimulation were not performed.     ABNORMALITY -Seizure without clinical signs, right anterior temporal region -Lateralized periodic discharges with overriding fast activity, right hemisphere, maximal right anterior temporal region ( LPD+) - Background suppression, generalized - Intermittent slow, generalized   IMPRESSION: This study showed two seizures without clinical signs arising from right anterior temporal region on 10/24/2021 at 0726 and 0813, lasting about 1 minute each.  Additionally there are lateralized periodic  discharges with overriding fast activity which is on the ictal-interictal continuum with high potential for seizure recurrence. There is also profound diffuse encephalopathy, nonspecific etiology but likely related to sedation.   Dr Rory Percy was notified   Dyson Sevey [name]

## 2021-10-24 NOTE — Progress Notes (Addendum)
Pharmacy Electrolyte Replacement  Recent Labs:  Recent Labs    10/24/21 0615 10/24/21 0618 10/24/21 1045  K 3.0*   < > 3.1*  MG 1.8  --   --   PHOS 3.3  --   --   CREATININE 1.02  --   --    < > = values in this interval not displayed.    Low Critical Values (K </= 2.5, Phos </= 1, Mg </= 1) Present: None  MD Contacted: n/a - no critical values noted  Plan: Mag sulfate 2g IV x 1 KCl 87meq PO q4h x 2 doses Recheck AM labs   Arturo Morton, PharmD, BCPS Please check AMION for all [facility] contact numbers Clinical Pharmacist 10/24/2021 3:13 PM

## 2021-10-25 ENCOUNTER — Inpatient Hospital Stay (HOSPITAL_COMMUNITY): Payer: Medicare Other

## 2021-10-25 DIAGNOSIS — R569 Unspecified convulsions: Secondary | ICD-10-CM | POA: Diagnosis not present

## 2021-10-25 DIAGNOSIS — G40901 Epilepsy, unspecified, not intractable, with status epilepticus: Secondary | ICD-10-CM | POA: Diagnosis not present

## 2021-10-25 LAB — BASIC METABOLIC PANEL
Anion gap: 12 (ref 5–15)
BUN: 6 mg/dL — ABNORMAL LOW (ref 8–23)
CO2: 16 mmol/L — ABNORMAL LOW (ref 22–32)
Calcium: 8.3 mg/dL — ABNORMAL LOW (ref 8.9–10.3)
Chloride: 116 mmol/L — ABNORMAL HIGH (ref 98–111)
Creatinine, Ser: 1.12 mg/dL (ref 0.61–1.24)
GFR, Estimated: >60 mL/min (ref >60)
Glucose, Bld: 63 mg/dL — ABNORMAL LOW (ref 70–99)
Potassium: 5 mmol/L (ref 3.5–5.1)
Sodium: 144 mmol/L (ref 135–145)

## 2021-10-25 LAB — PHENYTOIN LEVEL, TOTAL: Phenytoin Lvl: 27.4 ug/mL — ABNORMAL HIGH (ref 10.0–20.0)

## 2021-10-25 LAB — GLUCOSE, CAPILLARY
Glucose-Capillary: 100 mg/dL — ABNORMAL HIGH (ref 70–99)
Glucose-Capillary: 189 mg/dL — ABNORMAL HIGH (ref 70–99)
Glucose-Capillary: 112 mg/dL — ABNORMAL HIGH (ref 70–99)

## 2021-10-25 LAB — CSF CELL COUNT WITH DIFFERENTIAL
RBC Count, CSF: 450 /mm3 — ABNORMAL HIGH
Tube #: 3
WBC, CSF: 3 /mm3 (ref 0–5)

## 2021-10-25 LAB — MAGNESIUM: Magnesium: 1.9 mg/dL (ref 1.7–2.4)

## 2021-10-25 LAB — PROTEIN, CSF: Total  Protein, CSF: 152 mg/dL — ABNORMAL HIGH (ref 15–45)

## 2021-10-25 LAB — PHOSPHORUS: Phosphorus: 4.4 mg/dL (ref 2.5–4.6)

## 2021-10-25 LAB — GLUCOSE, CSF: Glucose, CSF: 66 mg/dL (ref 40–70)

## 2021-10-25 LAB — PHENOBARBITAL LEVEL: Phenobarbital: 24.5 ug/mL (ref 15.0–30.0)

## 2021-10-25 MED ORDER — FOLIC ACID 5 MG/ML IJ SOLN
1.0000 mg | Freq: Every day | INTRAMUSCULAR | Status: DC
  Administered 2021-10-25 – 2021-10-29 (×5): 1 mg via INTRAVENOUS
  Filled 2021-10-25 (×6): qty 0.2

## 2021-10-25 MED ORDER — VITAL HIGH PROTEIN PO LIQD
1000.0000 mL | ORAL | Status: DC
  Administered 2021-10-25 – 2021-10-26 (×2): 1000 mL

## 2021-10-25 MED ORDER — SODIUM CHLORIDE 0.9 % IV SOLN
1500.0000 mg | Freq: Once | INTRAVENOUS | Status: AC
  Administered 2021-10-25: 1500 mg via INTRAVENOUS
  Filled 2021-10-25: qty 30

## 2021-10-25 MED ORDER — PHENOBARBITAL SODIUM 65 MG/ML IJ SOLN
65.0000 mg | Freq: Two times a day (BID) | INTRAMUSCULAR | Status: DC
  Administered 2021-10-25 – 2021-10-31 (×12): 65 mg via INTRAVENOUS
  Filled 2021-10-25 (×12): qty 1

## 2021-10-25 MED ORDER — LIDOCAINE HCL (PF) 1 % IJ SOLN
5.0000 mL | Freq: Once | INTRAMUSCULAR | Status: AC
  Administered 2021-10-25: 5 mL via INTRADERMAL

## 2021-10-25 MED ORDER — SODIUM CHLORIDE 0.9 % IV SOLN
20.0000 mg/kg | Freq: Once | INTRAVENOUS | Status: AC
  Administered 2021-10-25: 1605.5 mg via INTRAVENOUS
  Filled 2021-10-25: qty 12.35

## 2021-10-25 MED ORDER — DEXTROSE 5 % IV SOLN: INTRAVENOUS | Status: DC

## 2021-10-25 MED ORDER — PROSOURCE TF PO LIQD
45.0000 mL | Freq: Two times a day (BID) | ORAL | Status: DC
  Administered 2021-10-25 – 2021-10-27 (×4): 45 mL
  Filled 2021-10-25 (×4): qty 45

## 2021-10-25 MED ORDER — SODIUM CHLORIDE 0.9 % IV SOLN
200.0000 mg | Freq: Two times a day (BID) | INTRAVENOUS | Status: DC
  Administered 2021-10-25 – 2021-11-08 (×30): 200 mg via INTRAVENOUS
  Filled 2021-10-25 (×32): qty 20

## 2021-10-25 MED ORDER — ENOXAPARIN SODIUM 40 MG/0.4ML IJ SOSY
40.0000 mg | PREFILLED_SYRINGE | INTRAMUSCULAR | Status: DC
  Administered 2021-10-26 – 2021-10-27 (×3): 40 mg via SUBCUTANEOUS
  Filled 2021-10-25 (×3): qty 0.4

## 2021-10-25 MED ORDER — ADULT MULTIVITAMIN LIQUID CH
15.0000 mL | Freq: Every day | ORAL | Status: DC
  Administered 2021-10-25: 15 mL via ORAL
  Filled 2021-10-25: qty 15

## 2021-10-25 MED ORDER — POLYETHYLENE GLYCOL 3350 17 G PO PACK
17.0000 g | PACK | Freq: Every day | ORAL | Status: DC | PRN
  Administered 2021-10-27: 17 g
  Filled 2021-10-25: qty 1

## 2021-10-25 MED ORDER — DOCUSATE SODIUM 50 MG/5ML PO LIQD
100.0000 mg | Freq: Two times a day (BID) | ORAL | Status: DC | PRN
  Administered 2021-10-27 – 2021-11-26 (×4): 100 mg
  Filled 2021-10-25 (×4): qty 10

## 2021-10-25 MED ORDER — ADULT MULTIVITAMIN LIQUID CH
15.0000 mL | Freq: Every day | ORAL | Status: DC
  Administered 2021-10-26 – 2021-10-27 (×2): 15 mL
  Filled 2021-10-25 (×2): qty 15

## 2021-10-25 NOTE — Progress Notes (Signed)
Pt transported on vent from 4N21 to IR and back without any complications. Vitals stable, RN at bedside, RT will continue to monitor.

## 2021-10-25 NOTE — Progress Notes (Signed)
Patietn having frequent seizures on EEG, will increase propofol from 40 to 60 and reload with fosphenytoin.   Roland Rack, MD [facility] [phone]  If 7pm- 7am, please page neurology on call as listed in [facility].

## 2021-10-25 NOTE — Progress Notes (Signed)
Neurology Progress Note   S:// Seen and examined.  No seizure activity overnight clinically but multiple EEG seizures  O:// Current vital signs: BP 133/69   Pulse 62   Temp (!) 96.5 F (35.8 C) (Axillary) Comment: Reported to nurse  Resp 16   Ht _0  (1.778 m)   Wt 80.3 kg   SpO2 100%   BMI 25.40 kg/m  Vital signs in last 24 hours: Temp:  [96.5 F (35.8 C)-97.6 F (36.4 C)] 96.5 F (35.8 C) (11/02 0800) Pulse Rate:  [60-72] 62 (11/02 0800) Resp:  [15-16] 16 (11/02 0800) BP: (105-168)/(59-89) 133/69 (11/02 0800) SpO2:  [99 %-100 %] 100 % (11/02 0800) FiO2 (%):  [30 %-40 %] 40 % (11/02 0741) Weight:  [80.3 kg] 80.3 kg (11/02 0355) General: Sedated on propofol and Versed drips, intubated HEENT: Normocephalic/atraumatic CVs: Regular rate rhythm Respiratory: Vented Abdomen nondistended nontender Extremities warm well perfused Neurological exam Sedated intubated-on propofol and Versed drips Propofol is held for 10 minutes prior to examination. No spontaneous movements No response to noxious stimulation Remains nonverbal Cranial nerves: Right pupil is pinpoint and sluggishly reactive, left cornea is hazy-cannot visualize pupil, gaze appears midline, does not blink to threat from either side, facial symmetry difficult to ascertain due to endotracheal tube in place. Motor exam: No spontaneous movement, no withdrawal to noxious stimulation in all fours Sensory exam: As above Coordination cannot be assessed given his current mentation  Medications  Current Facility-Administered Medications:    0.9 %  sodium chloride infusion, 250 mL, Intravenous, Continuous, Alva, Leanna Sato, MD   0.9 %  sodium chloride infusion, , Intravenous, Continuous, von Dohlen, Stasia Cavalier, RPH, Last Rate: 125 mL/hr at 10/25/21 0700, Infusion Verify at 10/25/21 0700   0.9 %  sodium chloride infusion, , Intravenous, PRN, Candee Furbish, MD, Stopped at 10/25/21 0608   acyclovir (ZOVIRAX) 795 mg in dextrose  5 % 150 mL IVPB, 10 mg/kg, Intravenous, Q8H, von Dohlen, Haley B, RPH, Last Rate: 166 mL/hr at 10/25/21 0700, Infusion Verify at 10/25/21 0700   chlorhexidine gluconate (MEDLINE KIT) (PERIDEX) 0.12 % solution 15 mL, 15 mL, Mouth Rinse, BID, Rigoberto Noel, MD, 15 mL at 10/25/21 0801   Chlorhexidine Gluconate Cloth 2 % PADS 6 each, 6 each, Topical, Daily, Rigoberto Noel, MD, 6 each at 10/24/21 2210   docusate sodium (COLACE) capsule 100 mg, 100 mg, Oral, BID PRN, Rigoberto Noel, MD   enoxaparin (LOVENOX) injection 40 mg, 40 mg, Subcutaneous, Q24H, Candee Furbish, MD   lacosamide (VIMPAT) 200 mg in sodium chloride 0.9 % 25 mL IVPB, 200 mg, Intravenous, Q12H, Greta Doom, MD   levETIRAcetam (KEPPRA) IVPB 1500 mg/ 100 mL premix, 1,500 mg, Intravenous, Q12H, Rosalin Hawking, MD, Stopped at 10/24/21 2226   MEDLINE mouth rinse, 15 mL, Mouth Rinse, 10 times per day, Rigoberto Noel, MD, 15 mL at 10/25/21 0510   midazolam PF (VERSED) injection 15 mg, 15 mg, Intravenous, Q5 min PRN **AND** midazolam (VERSED) bolus via infusion 15 mg, 15 mg, Intravenous, Q5 min PRN, 15 mg at 10/23/21 1902 **AND** midazolam (VERSED) 100 mg/100 mL (1 mg/mL) premix infusion, 10 mg/hr, Intravenous, Continuous, Horton, Kristie M, DO, Last Rate: 10 mL/hr at 10/25/21 0700, 10 mg/hr at 10/25/21 0700   norepinephrine (LEVOPHED) 63m in 257mpremix infusion, 2-10 mcg/min, Intravenous, Titrated, AlKara Mead, MD, Last Rate: 15 mL/hr at 10/25/21 0700, 4 mcg/min at 10/25/21 0700   pantoprazole (PROTONIX) injection 40 mg, 40 mg, Intravenous, Q24H,  Rosalin Hawking, MD, 40 mg at 10/24/21 1453   polyethylene glycol (MIRALAX / GLYCOLAX) packet 17 g, 17 g, Oral, Daily PRN, Rigoberto Noel, MD   propofol (DIPRIVAN) 1000 MG/100ML infusion, 5-80 mcg/kg/min, Intravenous, Continuous, Horton, Kristie M, DO, Last Rate: 28.5 mL/hr at 10/25/21 0700, 60 mcg/kg/min at 10/25/21 0700   thiamine 577m in normal saline (558m IVPB, 500 mg, Intravenous, Q8H,  Stopped at 10/25/21 0540 **FOLLOWED BY** [START ON 10/26/2021] thiamine (B-1) 250 mg in sodium chloride 0.9 % 50 mL IVPB, 250 mg, Intravenous, Daily **FOLLOWED BY** [START ON 11/01/2021] thiamine (B-1) injection 100 mg, 100 mg, Intravenous, Daily, ArAmie PortlandMD Labs CBC    Component Value Date/Time   WBC 8.2 10/24/2021 0615   RBC 3.81 (L) 10/24/2021 0615   HGB 11.9 (L) 10/24/2021 1045   HGB 12.9 (L) 05/12/2021 0921   HCT 35.0 (L) 10/24/2021 1045   HCT 38.7 05/12/2021 0921   PLT 199 10/24/2021 0615   PLT 295 05/12/2021 0921   MCV 80.6 10/24/2021 0615   MCV 86 05/12/2021 0921   MCH 28.3 10/24/2021 0615   MCHC 35.2 10/24/2021 0615   RDW 14.6 10/24/2021 0615   RDW 15.8 (H) 05/12/2021 0921   LYMPHSABS 4.0 10/23/2021 1415   MONOABS 0.9 10/23/2021 1415   EOSABS 0.0 10/23/2021 1415   BASOSABS 0.0 10/23/2021 1415    CMP     Component Value Date/Time   NA 144 10/25/2021 0445   NA 139 05/12/2021 0921   K 5.0 10/25/2021 0445   CL 116 (H) 10/25/2021 0445   CO2 16 (L) 10/25/2021 0445   GLUCOSE 63 (L) 10/25/2021 0445   BUN 6 (L) 10/25/2021 0445   BUN 10 05/12/2021 0921   CREATININE 1.12 10/25/2021 0445   CREATININE 1.01 12/09/2014 0926   CALCIUM 8.3 (L) 10/25/2021 0445   PROT 8.1 10/23/2021 1415   PROT 7.8 05/12/2021 0921   ALBUMIN 4.3 10/23/2021 1415   ALBUMIN 4.9 (H) 05/12/2021 0921   AST 97 (H) 10/23/2021 1415   ALT 134 (H) 10/23/2021 1415   ALKPHOS 137 (H) 10/23/2021 1415   BILITOT 0.6 10/23/2021 1415   BILITOT 0.2 05/12/2021 0921   GFRNONAA >60 10/25/2021 0445   GFRAA 80 12/07/2020 1529   UDS negative on arrival EtOH undetectable on arrival  Spot EEG yesterday on arrival-demonstrated background suppression that was generalized and intermittent slowing that was also generalized suggestive of profound diffuse encephalopathy-nonspecific but likely related to sedation.  No seizures or epileptiform discharges seen throughout recording.  LTM EEG from overnight-   IMPRESSION: This study showed multiple seizures without clinical signs arising from right anterior temporal region as described above, lasting about 30 seconds to 1 minute. Additionally there are lateralized periodic discharges with overriding fast activity which is on the ictal-interictal continuum with high potential for seizure recurrence. There is also profound diffuse encephalopathy, nonspecific etiology but likely related to sedation.   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  CT perfusion with  no acute infarction or perfusion deficit.  MR brain w+w/o - NAD  Assessment: 70 year old man with history of hypertension, peripheral vascular disease, congenital truck,, cataract, tobacco abuse, left frontal and occipital traumatic contusion with small left frontal SDH in 2019, history of alcohol abuse who presented as a code stroke for right gaze and left-sided weakness with questionable seizure activity reported by bystander with blood pressures elevated on scene and in the ER with repeat generalized tonic-clonic seizure with eye deviation to the left x2 in the emergency room for which she received Ativan and Keppra load and after the second episode also loaded with fosphenytoin. Currently on Keppra, IV propofol and IV midazolam drip and hooked up to continuous EEG. LTM EEG from overnight with  continued sz. Loaded again with Fosphenytoin. Overnight MRI unremarkable.  Impression: -Seizures (has been evaluated by EEG in 2019 for possible syncope but no docuemted h/o seizures in the past), status epilepticus with ensuing Todd's paralysis -continuing EEG seizures o/n -PLEDs -Evaluate for stroke due to presenting with stroke like symptoms -Hypertensive emergency  Recommendations: MRI brain with and without contrast as soon as possible-has MRI safe leads-spoke with the RN who is coordinating. After the MRI, recommend LP with glucose, protein, cell count, gram stain and HSV PCR. For now start acyclovir empirically for HSV encephalitis-pharmacy consult requested High dose Thiamine given ETOH history Continue current dose of Keppra and Vimpat. Add phenobarb (given h/o EtOH) Continue Propofol at 60. May need to add Versed drip to attain burst suppression if he conitnues to have seizures on EEG with current plan EEG discussed with Dr. Hortense Ramal and plan discussed with Dr. Tamala Julian from critical care.  -- Amie Portland, MD Neurologist [facility] Pager: [phone]  CRITICAL CARE ATTESTATION Performed by: Amie Portland, MD Total critical care time: 39 minutes Critical care time was exclusive of separately billable procedures and treating other patients and/or supervising APPs/Residents/Students Critical care was necessary to treat or prevent imminent or life-threatening deterioration due to seizures, htn emergency  This patient is critically ill and at significant risk for neurological worsening and/or death and care requires constant monitoring. Critical care was time spent personally by me on the following activities: development of treatment plan with patient and/or surrogate as well as nursing, discussions with consultants, evaluation of patient's response to treatment, examination of patient, obtaining history from patient or surrogate, ordering and performing treatments and  interventions, ordering and review of laboratory studies, ordering and review of radiographic studies, pulse oximetry, re-evaluation of patient's condition, participation in multidisciplinary rounds and medical decision making of high complexity in the care of this patient.

## 2021-10-25 NOTE — Progress Notes (Signed)
NAME:  Jeffrey Mullen, MRN:  [id number], DOB:  03/08/1951, LOS: 2 ADMISSION DATE:  10/23/2021, CONSULTATION DATE:  10/31 REFERRING MD:  Dr. Dina Rich, CHIEF COMPLAINT:  AMS   History of Present Illness:  70 y/o M who presented to [facility] ER on 10/31 via EMS with reports of altered mental status.    The patient was at a grocery store.  He was seen walking around, later to be found lying on the ground with right sided gaze, altered and left sided weakness.  There were concerns for possible seizure. He was initially billed as a CODE STROKE. The patient was taken emergently to CT for imaging which was negative for LVO.  While in the [place], he had a witnessed seizure.  He was given 2mg  of ativan and seizure stopped.  However, he was sedate and having periods of apnea requiring intubation.  He was hypertensive and tachycardic.  Neurology initiated Keppra 3gm load.  The patient was started on propofol for sedation.  EEG, MRI pending.  Initial ABG 7.169 / 66 / 413 / 24. Initial labs - Na 144, K 3.9, cl 105, CO2 19, BUN 6, Sr Cr 1.46 (up from 0.9), AST 97, ALT 134, WBC 12.6, Hgb 13.3 and platelets 274.  Initial CXR was negative for infiltrate, ETT in good position.  After intubation, he had an additional seizure.   PCCM called for ICU admission.   Chart review shows he was seen in the ER on 10/4 after passing out at the [employer].  Physical exam was negative at the time.  Labs negative.  No further imaging obtained.   Pertinent  Medical History  Alcohol Use - unclear quantity Anxiety / Depression  Congenital blindness of left eye, cataract on R Hepatitis C+, treated in 2016 Seizures  Significant Hospital Events: Including procedures, antibiotic start and stop dates in addition to other pertinent events   10/31 Admit with AMS, seizure, hypertensive emergency, AKI  Interim History / Subjective:  Remains sedated, still intermittent seizures on cEEG.  Objective   Blood pressure 133/69, pulse 62,  temperature (!) 96.5 F (35.8 C), temperature source Axillary, resp. rate 16, height 5\' 10"  (1.778 m), weight 80.3 kg, SpO2 100 %.    Vent Mode: PRVC FiO2 (%):  [30 %-40 %] 40 % Set Rate:  [16 bmp] 16 bmp Vt Set:  [440 mL] 440 mL PEEP:  [5 cmH20] 5 cmH20 Plateau Pressure:  [13 cmH20-15 cmH20] 13 cmH20   Intake/Output Summary (Last 24 hours) at 10/25/2021 0916 Last data filed at 10/25/2021 0800 Gross per 24 hour  Intake 4044.86 ml  Output 2220 ml  Net 1824.86 ml    Filed Weights   10/23/21 1400 10/24/21 0500 10/25/21 0355  Weight: 79.2 kg 79.3 kg 80.3 kg    Examination: No distress on vent L eye cataract noted RASS -5 Not moving anything with sedation wean per RN Remains on propofol high doses, cEEG No edema Heart and lung sounds normal  K slightly up Sugars borderline No CBC  MRI benign  Resolved Hospital Problem list     Assessment & Plan:   Ongoing status epilepticus History of EtOH abuse, prior SDH LVO ruled out on CT.  UDS neg.  MRI benign.  Ongoing cEEG. 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 - empiric acyclovir  Hypoglycemia- D5  gtt until LP done then can start TF  Acute Hypercarbic Respiratory Failure Tobacco Abuse  - Vent bundle - Mental status precludes extubation at present  AKI  Baseline sr cr ~0.9 to 1  -Trend BMP / urinary output -Replace electrolytes as indicated -Avoid nephrotoxic agents, ensure adequate renal perfusion  Complex social dynamics- may need help figuring out POA  Best Practice (right click and "Reselect all SmartList Selections" daily)  Diet/type: NPO, TF after  DVT prophylaxis: LMWH GI prophylaxis: PPI Lines: N/A Foley:  dc foley Code Status:  full code Last date of multidisciplinary goals of care discussion - pending, complex family dynamics per [place] RN note today at  11:40AM   Patient critically ill due to resp failure Interventions to address this today vent titration Risk of deterioration without these interventions is high  I personally spent 38 minutes providing critical care not including any separately billable procedures  Erskine Emery MD [facility]  Prefer epic messenger for cross cover needs If after hours, please call E-link

## 2021-10-25 NOTE — Procedures (Addendum)
Patient Name: Jeffrey Mullen  MRN: [id number]  Epilepsy Attending: Lora Havens  Referring Physician/Provider: Dr Rosalin Hawking Duration: 10/24/2021 1846 to 10/25/2021 1846   Patient history: 70 y.o. male with PMH of HTN, PVD, congenital glaucoma, cataract, smoker, left frontal and occipital traumatic contusion with small left frontal SDH in 2019 presented to ED for right gaze and left sided weakness with questionable seizure activity. EEG to evaluate for seizure   Level of alertness: comatose   AEDs during EEG study: LEV, Propofol, Versed   Technical aspects: This EEG study was done with scalp electrodes positioned according to the 10-20 International system of electrode placement. Electrical activity was acquired at a sampling rate of 500Hz  and reviewed with a high frequency filter of 70Hz  and a low frequency filter of 1Hz . EEG data were recorded continuously and digitally stored.    Description: EEG showed generalized suppression alternating with 2-3 seconds of generalized low amplitude 6-9hz  theta-alpha activity every 3 to 6 seconds. Lateralized periodic discharges with overlying fast activity was also noticed in the right hemisphere maximal right anterior temporal region at 1 Hz. Multiple seizures without clinical signs were noted arising from right anterior temporal region on 10/24/2021 at 2005, 2015, 2117, 2130, 2149, 2252, 2300, 2310, 2316, 2319 and on 12/25/2020 at [place], [place], [place], [place], [place], [place], [place], [place], 0614, 0747, 0933, lasting about 30 seconds to 1 minute. Medications were adjusted. After around 1130 on 10/25/2021, EEG showed predominantly background suppression. Hyperventilation and photic stimulation were not performed.     ABNORMALITY -Seizure without clinical signs, right anterior temporal region -Lateralized periodic discharges with overriding fast activity, right hemisphere, maximal right anterior temporal region ( LPD+) - Background suppression, generalized - Intermittent  slow, generalized   IMPRESSION: This study showed multiple seizures without clinical signs arising from right anterior temporal region as described above, lasting about 30 seconds to 1 minute. Last seizure was on 10/25/2021 at 0933. Additionally there are lateralized periodic discharges with overriding fast activity which is on the ictal-interictal continuum with high potential for seizure recurrence. There is also profound diffuse encephalopathy, nonspecific etiology but likely related to sedation.    Dr Rory Percy was notified.    Jeffrey Mullen

## 2021-10-25 NOTE — Progress Notes (Signed)
Initial Nutrition Assessment  DOCUMENTATION CODES:   Not applicable  INTERVENTION:   Initiate Vital High Protein @ 35 ml/hr via OGT (840 ml daily)  45 ml Prosource TF BID.    Tube feeding regimen provides 920 kcal (58% of needs), 95 grams of protein, and 702 ml of H2O.    TF + propofol provides 1672 kcals  NUTRITION DIAGNOSIS:   Inadequate oral intake related to inability to eat as evidenced by NPO status.  GOAL:   Patient will meet greater than or equal to 90% of their needs  MONITOR:   Vent status, Labs, Weight trends, TF tolerance, Skin, I & O's  REASON FOR ASSESSMENT:   Ventilator    ASSESSMENT:   70 y/o M who presented to [facility] ER on 10/31 via EMS with reports of altered mental status.  Pt admitted with acute metabolic encephalopathy and hypertensive emergency.   Patient is currently intubated on ventilator support. MV: 7.2 L/min Temp (24hrs), Avg:96.4 F (35.8 C), Min:94.1 F (34.5 C), Max:97.4 F (36.3 C)  Propofol: 28.5 ml/hr (provides 752 kcals daily)  Reviewed I/O's: +1.8 L x 24 hours and +3.1 L since admission  UOP: 2.4 L x 24 hours  MAP: 78  Case discussed with MD; plan to start TF after lumbar puncture today.    Reviewed wt hx; pt wt has been stable over the past 6 months.   Medications reviewed and include folic acid, thiamine and 0.9% sodium chloride infusion @ 125 ml/hr.   Labs reviewed: Mg: 2.7, CBGS: 100 (inpatient orders for glycemic control are none).    Diet Order:   Diet Order             Diet NPO time specified  Diet effective now                   EDUCATION NEEDS:   Not appropriate for education at this time  Skin:  Skin Assessment: Reviewed RN Assessment  Last BM:  Unknown  Height:   Ht Readings from Last 1 Encounters:  10/23/21 5\' 10"  (1.778 m)    Weight:   Wt Readings from Last 1 Encounters:  10/25/21 80.3 kg    Ideal Body Weight:  75.5 kg  BMI:  Body mass index is 25.4 kg/m.  Estimated  Nutritional Needs:   Kcal:  1582  Protein:  95-110 grams  Fluid:  > 1.6 L    Loistine Chance, RD, LDN, [place] Registered Dietitian II Certified Diabetes Care and Education Specialist Please refer to [facility] for RD and/or RD on-call/weekend/after hours pager

## 2021-10-25 NOTE — Progress Notes (Signed)
Cannot reach family willing to consent for patient. In my and neurology's opinion this test is medically necessary and can be pursued on emergency basis.  Erskine Emery MD PCCM

## 2021-10-25 NOTE — Progress Notes (Signed)
LTM maint complete - no skin breakdown under:  No skin breakdown at [place] FP2 F7 A1

## 2021-10-25 NOTE — Progress Notes (Signed)
Tried again LP with US guidance, no success, will ask IR to do fluoro-guided, appreciate help, orders are in computer.  Erskine Emery MD PCCM

## 2021-10-26 ENCOUNTER — Inpatient Hospital Stay (HOSPITAL_COMMUNITY): Payer: Medicare Other

## 2021-10-26 DIAGNOSIS — G40901 Epilepsy, unspecified, not intractable, with status epilepticus: Secondary | ICD-10-CM | POA: Diagnosis not present

## 2021-10-26 DIAGNOSIS — R569 Unspecified convulsions: Secondary | ICD-10-CM | POA: Diagnosis not present

## 2021-10-26 LAB — BASIC METABOLIC PANEL
Anion gap: 6 (ref 5–15)
BUN: 8 mg/dL (ref 8–23)
CO2: 21 mmol/L — ABNORMAL LOW (ref 22–32)
Calcium: 7.5 mg/dL — ABNORMAL LOW (ref 8.9–10.3)
Chloride: 115 mmol/L — ABNORMAL HIGH (ref 98–111)
Creatinine, Ser: 1.52 mg/dL — ABNORMAL HIGH (ref 0.61–1.24)
GFR, Estimated: 50 mL/min — ABNORMAL LOW (ref >60)
Glucose, Bld: 114 mg/dL — ABNORMAL HIGH (ref 70–99)
Potassium: 4.2 mmol/L (ref 3.5–5.1)
Sodium: 142 mmol/L (ref 135–145)

## 2021-10-26 LAB — URINALYSIS, ROUTINE W REFLEX MICROSCOPIC
Bilirubin Urine: NEGATIVE
Glucose, UA: NEGATIVE mg/dL
Hgb urine dipstick: NEGATIVE
Ketones, ur: NEGATIVE mg/dL
Leukocytes,Ua: NEGATIVE
Nitrite: NEGATIVE
Protein, ur: NEGATIVE mg/dL
Specific Gravity, Urine: 1.017 (ref 1.005–1.030)
pH: 5 (ref 5.0–8.0)

## 2021-10-26 LAB — PHOSPHORUS
Phosphorus: 5 mg/dL — ABNORMAL HIGH (ref 2.5–4.6)
Phosphorus: 4.1 mg/dL (ref 2.5–4.6)

## 2021-10-26 LAB — MAGNESIUM
Magnesium: 1.8 mg/dL (ref 1.7–2.4)
Magnesium: 2.3 mg/dL (ref 1.7–2.4)

## 2021-10-26 LAB — GLUCOSE, CAPILLARY
Glucose-Capillary: 101 mg/dL — ABNORMAL HIGH (ref 70–99)
Glucose-Capillary: 124 mg/dL — ABNORMAL HIGH (ref 70–99)
Glucose-Capillary: 134 mg/dL — ABNORMAL HIGH (ref 70–99)
Glucose-Capillary: 125 mg/dL — ABNORMAL HIGH (ref 70–99)
Glucose-Capillary: 91 mg/dL (ref 70–99)
Glucose-Capillary: 68 mg/dL — ABNORMAL LOW (ref 70–99)

## 2021-10-26 LAB — HEMOGLOBIN A1C
Hgb A1c MFr Bld: 6 % — ABNORMAL HIGH (ref 4.8–5.6)
Mean Plasma Glucose: 125.5 mg/dL

## 2021-10-26 LAB — CREATININE, URINE, RANDOM: Creatinine, Urine: 159.21 mg/dL

## 2021-10-26 LAB — TRIGLYCERIDES: Triglycerides: 216 mg/dL — ABNORMAL HIGH (ref <150)

## 2021-10-26 LAB — SODIUM, URINE, RANDOM: Sodium, Ur: 38 mmol/L

## 2021-10-26 MED ORDER — DEXTROSE 50 % IV SOLN
INTRAVENOUS | Status: AC
  Administered 2021-10-27: 12.5 g via INTRAVENOUS
  Filled 2021-10-26: qty 50

## 2021-10-26 MED ORDER — DEXTROSE 50 % IV SOLN: 12.5000 g | Freq: Once | INTRAVENOUS | Status: AC

## 2021-10-26 MED ORDER — INSULIN ASPART 100 UNIT/ML IJ SOLN
0.0000 [IU] | INTRAMUSCULAR | Status: DC
  Administered 2021-10-26 (×3): 2 [IU] via SUBCUTANEOUS
  Administered 2021-10-27: 3 [IU] via SUBCUTANEOUS
  Administered 2021-10-27: 2 [IU] via SUBCUTANEOUS
  Administered 2021-10-27: 3 [IU] via SUBCUTANEOUS
  Administered 2021-10-27 – 2021-10-28 (×2): 5 [IU] via SUBCUTANEOUS
  Administered 2021-10-28: 3 [IU] via SUBCUTANEOUS
  Administered 2021-10-28 (×2): 5 [IU] via SUBCUTANEOUS
  Administered 2021-10-28: 8 [IU] via SUBCUTANEOUS
  Administered 2021-10-28: 5 [IU] via SUBCUTANEOUS
  Administered 2021-10-29 (×3): 2 [IU] via SUBCUTANEOUS
  Administered 2021-10-29 (×2): 3 [IU] via SUBCUTANEOUS
  Administered 2021-10-29 – 2021-10-30 (×2): 2 [IU] via SUBCUTANEOUS
  Administered 2021-10-30 (×2): 3 [IU] via SUBCUTANEOUS
  Administered 2021-10-30 – 2021-10-31 (×3): 2 [IU] via SUBCUTANEOUS
  Administered 2021-10-31: 5 [IU] via SUBCUTANEOUS
  Administered 2021-10-31 – 2021-11-01 (×2): 2 [IU] via SUBCUTANEOUS
  Administered 2021-11-01: 3 [IU] via SUBCUTANEOUS
  Administered 2021-11-01: 2 [IU] via SUBCUTANEOUS
  Administered 2021-11-01: 3 [IU] via SUBCUTANEOUS
  Administered 2021-11-01 – 2021-11-02 (×4): 2 [IU] via SUBCUTANEOUS
  Administered 2021-11-02: 3 [IU] via SUBCUTANEOUS
  Administered 2021-11-03 – 2021-11-04 (×4): 2 [IU] via SUBCUTANEOUS
  Administered 2021-11-04: 3 [IU] via SUBCUTANEOUS
  Administered 2021-11-04 (×2): 2 [IU] via SUBCUTANEOUS
  Administered 2021-11-05: 3 [IU] via SUBCUTANEOUS
  Administered 2021-11-05 – 2021-11-06 (×4): 2 [IU] via SUBCUTANEOUS

## 2021-10-26 MED ORDER — DEXTROSE 5 % IV SOLN
10.0000 mg/kg | Freq: Two times a day (BID) | INTRAVENOUS | Status: DC
  Filled 2021-10-26 (×2): qty 15.9

## 2021-10-26 MED ORDER — MAGNESIUM SULFATE 2 GM/50ML IV SOLN
2.0000 g | Freq: Once | INTRAVENOUS | Status: AC
  Administered 2021-10-26: 2 g via INTRAVENOUS
  Filled 2021-10-26: qty 50

## 2021-10-26 MED ORDER — IPRATROPIUM-ALBUTEROL 0.5-2.5 (3) MG/3ML IN SOLN
3.0000 mL | Freq: Four times a day (QID) | RESPIRATORY_TRACT | Status: DC
  Administered 2021-10-26 – 2021-10-30 (×19): 3 mL via RESPIRATORY_TRACT
  Filled 2021-10-26 (×17): qty 3

## 2021-10-26 MED ORDER — DEXTROSE 5 % IV SOLN
800.0000 mg | Freq: Two times a day (BID) | INTRAVENOUS | Status: DC
  Administered 2021-10-26 – 2021-10-27 (×4): 800 mg via INTRAVENOUS
  Filled 2021-10-26 (×6): qty 16

## 2021-10-26 NOTE — Progress Notes (Signed)
[facility] ADULT ICU REPLACEMENT PROTOCOL   The patient does apply for the [facility] Adult ICU Electrolyte Replacment Protocol based on the criteria listed below:   1.Exclusion criteria: TCTS patients, ECMO patients, and Dialysis patients 2. Is GFR >/= 30 ml/min? Yes.    Patient's GFR today is 50 3. Is SCr </= 2? Yes.   Patient's SCr is 1.52 mg/dL 4. Did SCr increase >/= 0.5 in 24 hours? No. 5.Pt's weight >40kg  Yes.   6. Abnormal electrolyte(s): mag 1.8  7. Electrolytes replaced per protocol 8.  Call MD STAT for K+ </= 2.5, Phos </= 1, or Mag </= 1 Physician:  n/a  Jeffrey Mullen 10/26/2021 5:21 AM

## 2021-10-26 NOTE — Progress Notes (Signed)
EEG maintenance performed.  Patient event button tested.  No skin breakdown observed at electrode sites F7, Fp1, Fp2.

## 2021-10-26 NOTE — Progress Notes (Signed)
Neurology Progress Note   S:// Seen and examined.   cEEG with burst suppression pattern. LP showed CSF with increased protein, 3 WBCs.   O:// Current vital signs: BP 103/65   Pulse 60   Temp (!) 97.4 F (36.3 C) (Axillary)   Resp 16   Ht '5\' 10"'  (1.778 m)   Wt 80 kg   SpO2 93%   BMI 25.31 kg/m  Vital signs in last 24 hours: Temp:  [93.9 F (34.4 C)-99.3 F (37.4 C)] 97.4 F (36.3 C) (11/03 0800) Pulse Rate:  [51-71] 60 (11/03 0930) Resp:  [14-17] 16 (11/03 0930) BP: (85-158)/(52-81) 103/65 (11/03 0930) SpO2:  [93 %-100 %] 93 % (11/03 0930) FiO2 (%):  [40 %] 40 % (11/03 0809) Weight:  [80 kg] 80 kg (11/03 0500) General: Sedated on propofol and Versed drips, intubated HEENT: Normocephalic/atraumatic CVs: Regular rate rhythm Respiratory: Vented Abdomen nondistended nontender Extremities warm well perfused Neurological exam Sedated intubated-on propofol and Versed drips Remains comatose Right pupil sluggishly reactive Breathing with the ventilator No response to noxious stimulation   Medications  Current Facility-Administered Medications:    0.9 %  sodium chloride infusion, 250 mL, Intravenous, Continuous, Alva, Rakesh V, MD   0.9 %  sodium chloride infusion, , Intravenous, Continuous, von Dohlen, Haley B, RPH, Last Rate: 125 mL/hr at 10/26/21 0600, Infusion Verify at 10/26/21 0600   0.9 %  sodium chloride infusion, , Intravenous, PRN, Candee Furbish, MD, Last Rate: 10 mL/hr at 10/26/21 0600, Infusion Verify at 10/26/21 0600   acyclovir (ZOVIRAX) 800 mg in dextrose 5 % 150 mL IVPB, 800 mg, Intravenous, Q12H, Candee Furbish, MD   chlorhexidine gluconate (MEDLINE KIT) (PERIDEX) 0.12 % solution 15 mL, 15 mL, Mouth Rinse, BID, Rigoberto Noel, MD, 15 mL at 10/26/21 0751   Chlorhexidine Gluconate Cloth 2 % PADS 6 each, 6 each, Topical, Daily, Rigoberto Noel, MD, 6 each at 10/25/21 2121   docusate (COLACE) 50 MG/5ML liquid 100 mg, 100 mg, Per Tube, BID PRN, Candee Furbish, MD   enoxaparin (LOVENOX) injection 40 mg, 40 mg, Subcutaneous, Q24H, Candee Furbish, MD, 40 mg at 10/26/21 0011   feeding supplement (PROSource TF) liquid 45 mL, 45 mL, Per Tube, BID, Candee Furbish, MD, 45 mL at 10/26/21 7793   feeding supplement (VITAL HIGH PROTEIN) liquid 1,000 mL, 1,000 mL, Per Tube, Q24H, Candee Furbish, MD, 1,000 mL at 90/30/09 2330   folic acid injection 1 mg, 1 mg, Intravenous, Daily, Candee Furbish, MD, 1 mg at 10/26/21 0926   insulin aspart (novoLOG) injection 0-15 Units, 0-15 Units, Subcutaneous, Q4H, Candee Furbish, MD, 2 Units at 10/26/21 0926   ipratropium-albuterol (DUONEB) 0.5-2.5 (3) MG/3ML nebulizer solution 3 mL, 3 mL, Nebulization, Q6H, Harris, Whitney D, NP, 3 mL at 10/26/21 0931   lacosamide (VIMPAT) 200 mg in sodium chloride 0.9 % 25 mL IVPB, 200 mg, Intravenous, Q12H, Greta Doom, MD, Stopped at 10/26/21 0341   levETIRAcetam (KEPPRA) IVPB 1500 mg/ 100 mL premix, 1,500 mg, Intravenous, Q12H, Rosalin Hawking, MD, Last Rate: 400 mL/hr at 10/26/21 0931, 1,500 mg at 10/26/21 0931   MEDLINE mouth rinse, 15 mL, Mouth Rinse, 10 times per day, Rigoberto Noel, MD, 15 mL at 10/26/21 0935   midazolam PF (VERSED) injection 15 mg, 15 mg, Intravenous, Q5 min PRN **AND** midazolam (VERSED) bolus via infusion 15 mg, 15 mg, Intravenous, Q5 min PRN, 15 mg at 10/23/21 1902 **AND** midazolam (VERSED) 100 mg/100 mL (1  mg/mL) premix infusion, 10 mg/hr, Intravenous, Continuous, Horton, Kristie M, DO, Last Rate: 10 mL/hr at 10/26/21 0750, 10 mg/hr at 10/26/21 0750   multivitamin liquid 15 mL, 15 mL, Per Tube, Daily, Candee Furbish, MD, 15 mL at 10/26/21 1610   norepinephrine (LEVOPHED) 9m in 2551mpremix infusion, 2-10 mcg/min, Intravenous, Titrated, SmCandee FurbishMD, Last Rate: 11.25 mL/hr at 10/26/21 0827, 3 mcg/min at 10/26/21 0827   pantoprazole (PROTONIX) injection 40 mg, 40 mg, Intravenous, Q24H, XuRosalin HawkingMD, 40 mg at 10/25/21 1703   PHENObarbital  (LUMINAL) injection 65 mg, 65 mg, Intravenous, BID, ArAmie PortlandMD, 65 mg at 10/26/21 0919   polyethylene glycol (MIRALAX / GLYCOLAX) packet 17 g, 17 g, Per Tube, Daily PRN, SmCandee FurbishMD   propofol (DIPRIVAN) 1000 MG/100ML infusion, 5-80 mcg/kg/min, Intravenous, Continuous, Horton, Kristie M, DO, Last Rate: 28.5 mL/hr at 10/26/21 0919, 60 mcg/kg/min at 10/26/21 0919   [COMPLETED] thiamine 50073mn normal saline (77m34mVPB, 500 mg, Intravenous, Q8H, Stopped at 10/26/21 0552 **FOLLOWED BY** thiamine (B-1) 250 mg in sodium chloride 0.9 % 50 mL IVPB, 250 mg, Intravenous, Daily **FOLLOWED BY** [START ON 11/01/2021] thiamine (B-1) injection 100 mg, 100 mg, Intravenous, Daily, ArorAmie Portland Labs CBC    Component Value Date/Time   WBC 8.2 10/24/2021 0615   RBC 3.81 (L) 10/24/2021 0615   HGB 11.9 (L) 10/24/2021 1045   HGB 12.9 (L) 05/12/2021 0921   HCT 35.0 (L) 10/24/2021 1045   HCT 38.7 05/12/2021 0921   PLT 199 10/24/2021 0615   PLT 295 05/12/2021 0921   MCV 80.6 10/24/2021 0615   MCV 86 05/12/2021 0921   MCH 28.3 10/24/2021 0615   MCHC 35.2 10/24/2021 0615   RDW 14.6 10/24/2021 0615   RDW 15.8 (H) 05/12/2021 0921   LYMPHSABS 4.0 10/23/2021 1415   MONOABS 0.9 10/23/2021 1415   EOSABS 0.0 10/23/2021 1415   BASOSABS 0.0 10/23/2021 1415    CMP     Component Value Date/Time   NA 142 10/26/2021 0356   NA 139 05/12/2021 0921   K 4.2 10/26/2021 0356   CL 115 (H) 10/26/2021 0356   CO2 21 (L) 10/26/2021 0356   GLUCOSE 114 (H) 10/26/2021 0356   BUN 8 10/26/2021 0356   BUN 10 05/12/2021 0921   CREATININE 1.52 (H) 10/26/2021 0356   CREATININE 1.01 12/09/2014 0926   CALCIUM 7.5 (L) 10/26/2021 0356   PROT 8.1 10/23/2021 1415   PROT 7.8 05/12/2021 0921   ALBUMIN 4.3 10/23/2021 1415   ALBUMIN 4.9 (H) 05/12/2021 0921   AST 97 (H) 10/23/2021 1415   ALT 134 (H) 10/23/2021 1415   ALKPHOS 137 (H) 10/23/2021 1415   BILITOT 0.6 10/23/2021 1415   BILITOT 0.2 05/12/2021 0921    GFRNONAA 50 (L) 10/26/2021 0356   GFRAA 80 12/07/2020 1529   UDS negative on arrival EtOH undetectable on arrival  CSF studies with glucose 66, RBC 450, WBC 3, protein 152   Spot EEG on arrival-demonstrated background suppression that was generalized and intermittent slowing that was also generalized suggestive of profound diffuse encephalopathy-nonspecific but likely related to sedation.  No seizures or epileptiform discharges seen throughout recording.  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 CT  angiography head and neck-as well as CT perfusion study-high-grade stenosis of the proximal left ICA at the bifurcation measuring up to 1.8 mm, bilateral high-grade stenoses within the cavernous internal carotid arteries bilaterally, high-grade stenosis or occlusion of the distal right vertebral artery at the due to-vessel is reconstituted at the right PICA origin.  CT perfusion with no acute infarction or perfusion deficit.  MR brain w+w/o - NAD  Assessment: 70 year old man with history of hypertension, peripheral vascular disease, congenital truck,, cataract, tobacco abuse, left frontal and occipital traumatic contusion with small left frontal SDH in 2019, history of alcohol abuse who presented as a code stroke for right gaze and left-sided weakness with questionable seizure activity reported by bystander with blood pressures elevated on scene and in the ER with repeat generalized tonic-clonic seizure with eye deviation to the left x2 in the emergency room for which she received Ativan and Keppra load and after the second episode also loaded with  fosphenytoin. Currently on Keppra, IV propofol and IV midazolam drip and hooked up to continuous EEG. Initial EEG showed PLEDs and currently shows burst suppression after propofol and midazolam IV. Loaded again with Fosphenytoin yesterday Overnight MRI unremarkable. CSF elevated protein - ?HSV encephalitis  Impression: -Seizures (has been evaluated by EEG in 2019 for possible syncope but no docuemted h/o seizures in the past), status epilepticus with ensuing Todd's paralysis -continuing EEG seizures o/n -PLEDs initially-resolved, CSF has increased protein - ?HSV encephalitis -Evaluate for stroke due to presenting with stroke like symptoms -Hypertensive emergency  Recommendations: Continue Acyclovir till HSV PCR was negative EEG with burst suppression - decrease Versed 43m/h and Propofol 40 mcg/kg/min Would keep burst suppressed for another 24h and then taper sedation. Maintain current antiepileptics After about good 24 to 48 hours of burst suppression, will gradually taper down Versed and propofol drips and follow his exam.  Till then needs to be on continuous EEG.  Plan discussed with Dr. STamala Julian -- AAmie Portland MD Neurologist [facility] Pager: [phone] CRITICAL CARE ATTESTATION Performed by: AAmie Portland MD Total critical care time: 30 minutes Critical care time was exclusive of separately billable procedures and treating other patients and/or supervising APPs/Residents/Students Critical care was necessary to treat or prevent imminent or life-threatening deterioration due to seizures, htn emergency  This patient is critically ill and at significant risk for neurological worsening and/or death and care requires constant monitoring. Critical care was time spent personally by me on the following activities: development of treatment plan with patient and/or surrogate as well as nursing, discussions with consultants, evaluation of patient's response to treatment,  examination of patient, obtaining history from patient or surrogate, ordering and performing treatments and interventions, ordering and review of laboratory studies, ordering and review of radiographic studies, pulse oximetry, re-evaluation of patient's condition, participation in multidisciplinary rounds and medical decision making of high complexity in the care of this patient.

## 2021-10-26 NOTE — Procedures (Addendum)
Patient Name: Jeffrey Mullen  MRN: [id number]  Epilepsy Attending: Lora Havens  Referring Physician/Provider: Dr Rosalin Hawking Duration: 10/25/2021 1846 to 10/26/2021 1846   Patient history: 70 y.o. male with PMH of HTN, PVD, congenital glaucoma, cataract, smoker, left frontal and occipital traumatic contusion with small left frontal SDH in 2019 presented to ED for right gaze and left sided weakness with questionable seizure activity. EEG to evaluate for seizure   Level of alertness: comatose   AEDs during EEG study: LEV,LCM, Phenobarb, Propofol, Versed   Technical aspects: This EEG study was done with scalp electrodes positioned according to the 10-20 International system of electrode placement. Electrical activity was acquired at a sampling rate of 500Hz  and reviewed with a high frequency filter of 70Hz  and a low frequency filter of 1Hz . EEG data were recorded continuously and digitally stored.    Description: EEG showed burst suppression with generalized bursts of 5-6hz  polymorphic theta slowing lasting 1-3 seconds as well as eeg suppression which was initially lasting 30-50 seconds. Gradually the duration of eeg suppression became 2-5 seconds. Hyperventilation and photic stimulation were not performed.   Patient event button was pressed on 10/26/2021 at 1741. Per RN, patient was shivering while placing catheter. Concomitant eeg before, during and after the event didn't show any eeg change to suggest seizure.  Parts of study were difficult to evaluate due to significant electrode and ekg artifact.    ABNORMALITY -Burst suppression, generalized   IMPRESSION: This technically difficult study is suggestive of profound diffuse encephalopathy, nonspecific etiology but likely related to sedation. No seizures were seen during this study.  Patient event button was pressed on 10/26/2021 at 1741. Per RN, patient was shivering while placing catheter. Concomitant eeg did not show any eeg change to  suggest seizure. This was most likely not an epileptic event.   EEG appears to be improving compared to previous day.   Alyda Megna [name]

## 2021-10-26 NOTE — Progress Notes (Signed)
PHARMACY NOTE:  ANTIMICROBIAL RENAL DOSAGE ADJUSTMENT  Current antimicrobial regimen includes a mismatch between antimicrobial dosage and estimated renal function.  As per policy approved by the Pharmacy & Therapeutics and Medical Executive Committees, the antimicrobial dosage will be adjusted accordingly.  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 []      On intermittent HD, scheduled: []      On CRRT    Antimicrobial dosage has been changed to:  acyclovir 800 mg q 12h  Additional comments: Scr 1.1 >> 1.5, will continue to monitor On supportive fluids at 125 cc/hr  Thank you for allowing pharmacy to be a part of this patient's care.  Aleene Davidson, [facility] 10/26/2021 2:49 PM

## 2021-10-26 NOTE — Progress Notes (Addendum)
NAME:  Jeffrey Mullen, MRN:  [id number], DOB:  1951/04/15, LOS: 3 ADMISSION DATE:  10/23/2021, CONSULTATION DATE:  10/31 REFERRING MD:  Dr. Dina Rich, CHIEF COMPLAINT:  AMS   History of Present Illness:  70 y/o M who presented to [facility] ER on 10/31 via EMS with reports of altered mental status.  Given  right sided gaze, altered and left sided weakness, code stoke called. The patient was taken emergently to CT for imaging which was negative for LVO.  While in the [place], he had a witnessed seizure.    PCCM called for ICU admission.   Chart review shows he was seen in the ER on 10/4 after passing out at the [employer].  Physical exam was negative at the time.  Labs negative.  No further imaging obtained.   Pertinent  Medical History  Alcohol Use - unclear quantity Anxiety / Depression  Congenital blindness of left eye, cataract on R Hepatitis C+, treated in 2016 Seizures   Significant Hospital Events: Including procedures, antibiotic start and stop dates in addition to other pertinent events   10/31 Admit with AMS, seizure, hypertensive emergency, AKI 11/1 MRI brain no acute intracranial process. No etiology is seen for the patient's seizure. 11/02 bedside LP attempted but unsuccessful  11/03  No seizures seen on EEG overnight   Interim History / Subjective:  Sedated on vent   Objective   Blood pressure 106/61, pulse 65, temperature 97.9 F (36.6 C), temperature source Axillary, resp. rate 16, height 5\' 10"  (1.778 m), weight 80 kg, SpO2 99 %.    Vent Mode: PRVC FiO2 (%):  [40 %] 40 % Set Rate:  [16 bmp] 16 bmp Vt Set:  [440 mL] 440 mL PEEP:  [5 cmH20] 5 cmH20 Plateau Pressure:  [13 cmH20-14 cmH20] 13 cmH20   Intake/Output Summary (Last 24 hours) at 10/26/2021 7824 Last data filed at 10/26/2021 0600 Gross per 24 hour  Intake 5313.73 ml  Output 500 ml  Net 4813.73 ml    Filed Weights   10/24/21 0500 10/25/21 0355 10/26/21 0500  Weight: 79.3 kg 80.3 kg 80 kg     Examination: General: Acute on chronically ill appearing middle aged male lying in bed on mechanical vent, in NAD HEENT: [place]/AT, MM pink/moist, PERRL,  Neuro: Sedated on vent CV: s1s2 regular rate and rhythm, no murmur, rubs, or gallops,  PULM:  Clear to ausculation with faint expiratory wheeze, tolerating vent  GI: soft, bowel sounds active in all 4 quadrants, non-tender, non-distended, tolerating TF Extremities: warm/dry, no  edema  Skin: no rashes or lesions  Resolved Hospital Problem list     Assessment & Plan:  Ongoing status epilepticus -No seizure seen on EEG overnight, improved from day prior  History of EtOH abuse, prior SDH -LVO ruled out on CT.  UDS neg.  MRI benign.  Ongoing cEEG. P: Primary management per neurology  Maintain neuro protective measures; goal for eurothermia, euglycemia, eunatermia, normoxia, and PCO2 goal of 35-40 Nutrition and bowel regiment  Seizure precautions  AEDs per neurology  Continuous EEG Continue propofol  High dose thiamine per neuro  Currently on empiric Acyclovir  Aspirations precautions  LP completed per IR 11/02  Acute Hypoxic Respiratory Failure  -in the setting of status  P: Continue ventilator support with lung protective strategies  Wean PEEP and FiO2 for sats greater than 90%. Head of bed elevated 30 degrees. Plateau pressures less than 30 cm H20.  Follow intermittent chest x-ray and ABG.   Unable  to SBT due to high sedation need to control seizures  Ensure adequate pulmonary hygiene  Follow cultures  VAP bundle in place  PAD protocol  AKI  -Baseline sr cr ~0.9 to 1  -Concern for Acyclovir as causative agent  P: Obtain urine sodium and creatinine   Check renal ultrasound Follow renal function Monitor  urine output Trend Bmet Avoid nephrotoxins, Ensure adequate renal perfusion  Stop Acyclovir as soon as able   Hypoglycemia P: Continue TF  SSI q4  Complex social dynamics -May need help figuring out  POA  Best Practice (right click and "Reselect all SmartList Selections" daily)  Diet/type: NPO, TF after  DVT prophylaxis: LMWH GI prophylaxis: PPI Lines: N/A Foley:  dc foley Code Status:  full code Last date of multidisciplinary goals of care discussion - pending, complex family dynamics per [place] RN note today at 11:40AM   CRITICAL CARE Performed by: Aleicia Kenagy D. Harris  Total critical care time: 45 minutes  Critical care time was exclusive of separately billable procedures and treating other patients.  Critical care was necessary to treat or prevent imminent or life-threatening deterioration.  Critical care was time spent personally by me on the following activities: development of treatment plan with patient and/or surrogate as well as nursing, discussions with consultants, evaluation of patient's response to treatment, examination of patient, obtaining history from patient or surrogate, ordering and performing treatments and interventions, ordering and review of laboratory studies, ordering and review of radiographic studies, pulse oximetry and re-evaluation of patient's condition.  Angela Platner D. Kenton Kingfisher, NP-C [facility] Personal contact information can be found on Amion  10/26/2021, 8:34 AM

## 2021-10-27 ENCOUNTER — Inpatient Hospital Stay (HOSPITAL_COMMUNITY): Payer: Medicare Other

## 2021-10-27 DIAGNOSIS — G40901 Epilepsy, unspecified, not intractable, with status epilepticus: Secondary | ICD-10-CM | POA: Diagnosis not present

## 2021-10-27 DIAGNOSIS — J9601 Acute respiratory failure with hypoxia: Secondary | ICD-10-CM

## 2021-10-27 LAB — BASIC METABOLIC PANEL
Anion gap: 10 (ref 5–15)
BUN: 11 mg/dL (ref 8–23)
CO2: 17 mmol/L — ABNORMAL LOW (ref 22–32)
Calcium: 7.7 mg/dL — ABNORMAL LOW (ref 8.9–10.3)
Chloride: 115 mmol/L — ABNORMAL HIGH (ref 98–111)
Creatinine, Ser: 1.59 mg/dL — ABNORMAL HIGH (ref 0.61–1.24)
GFR, Estimated: 47 mL/min — ABNORMAL LOW (ref >60)
Glucose, Bld: 128 mg/dL — ABNORMAL HIGH (ref 70–99)
Potassium: 3.3 mmol/L — ABNORMAL LOW (ref 3.5–5.1)
Sodium: 142 mmol/L (ref 135–145)

## 2021-10-27 LAB — CBC WITH DIFFERENTIAL/PLATELET
Abs Immature Granulocytes: 1.5 10*3/uL — ABNORMAL HIGH (ref 0.00–0.07)
Band Neutrophils: 25 %
Basophils Absolute: 0.1 10*3/uL (ref 0.0–0.1)
Basophils Relative: 1 %
Eosinophils Absolute: 0 10*3/uL (ref 0.0–0.5)
Eosinophils Relative: 0 %
HCT: 34.4 % — ABNORMAL LOW (ref 39.0–52.0)
Hemoglobin: 10.8 g/dL — ABNORMAL LOW (ref 13.0–17.0)
Lymphocytes Relative: 9 %
Lymphs Abs: 0.7 10*3/uL (ref 0.7–4.0)
MCH: 27.8 pg (ref 26.0–34.0)
MCHC: 31.4 g/dL (ref 30.0–36.0)
MCV: 88.7 fL (ref 80.0–100.0)
Metamyelocytes Relative: 18 %
Monocytes Absolute: 0.4 10*3/uL (ref 0.1–1.0)
Monocytes Relative: 6 %
Myelocytes: 2 %
Neutro Abs: 4.6 10*3/uL (ref 1.7–7.7)
Neutrophils Relative %: 38 %
Platelets: 187 10*3/uL (ref 150–400)
Promyelocytes Relative: 1 %
RBC: 3.88 MIL/uL — ABNORMAL LOW (ref 4.22–5.81)
RDW: 16.5 % — ABNORMAL HIGH (ref 11.5–15.5)
WBC Morphology: INCREASED
WBC: 7.3 10*3/uL (ref 4.0–10.5)
nRBC: 0 % (ref 0.0–0.2)

## 2021-10-27 LAB — HEPATIC FUNCTION PANEL
ALT: 36 U/L (ref 0–44)
AST: 28 U/L (ref 15–41)
Albumin: 2.2 g/dL — ABNORMAL LOW (ref 3.5–5.0)
Alkaline Phosphatase: 67 U/L (ref 38–126)
Bilirubin, Direct: 0.2 mg/dL (ref 0.0–0.2)
Indirect Bilirubin: 0.3 mg/dL (ref 0.3–0.9)
Total Bilirubin: 0.5 mg/dL (ref 0.3–1.2)
Total Protein: 5.1 g/dL — ABNORMAL LOW (ref 6.5–8.1)

## 2021-10-27 LAB — CBC
HCT: 33.3 % — ABNORMAL LOW (ref 39.0–52.0)
Hemoglobin: 10.5 g/dL — ABNORMAL LOW (ref 13.0–17.0)
MCH: 27.6 pg (ref 26.0–34.0)
MCHC: 31.5 g/dL (ref 30.0–36.0)
MCV: 87.6 fL (ref 80.0–100.0)
Platelets: 172 10*3/uL (ref 150–400)
RBC: 3.8 MIL/uL — ABNORMAL LOW (ref 4.22–5.81)
RDW: 16.4 % — ABNORMAL HIGH (ref 11.5–15.5)
WBC: 5.8 10*3/uL (ref 4.0–10.5)
nRBC: 0 % (ref 0.0–0.2)

## 2021-10-27 LAB — URINALYSIS, ROUTINE W REFLEX MICROSCOPIC
Glucose, UA: NEGATIVE mg/dL
Hgb urine dipstick: NEGATIVE
Ketones, ur: NEGATIVE mg/dL
Leukocytes,Ua: NEGATIVE
Nitrite: NEGATIVE
Protein, ur: NEGATIVE mg/dL
Specific Gravity, Urine: 1.019 (ref 1.005–1.030)
pH: 5 (ref 5.0–8.0)

## 2021-10-27 LAB — POCT I-STAT 7, (LYTES, BLD GAS, ICA,H+H)
Acid-base deficit: 14 mmol/L — ABNORMAL HIGH (ref 0.0–2.0)
Acid-base deficit: 10 mmol/L — ABNORMAL HIGH (ref 0.0–2.0)
Bicarbonate: 15.1 mmol/L — ABNORMAL LOW (ref 20.0–28.0)
Bicarbonate: 16.4 mmol/L — ABNORMAL LOW (ref 20.0–28.0)
Calcium, Ion: 1.14 mmol/L — ABNORMAL LOW (ref 1.15–1.40)
Calcium, Ion: 1.09 mmol/L — ABNORMAL LOW (ref 1.15–1.40)
HCT: 54 % — ABNORMAL HIGH (ref 39.0–52.0)
HCT: 28 % — ABNORMAL LOW (ref 39.0–52.0)
Hemoglobin: 18.4 g/dL — ABNORMAL HIGH (ref 13.0–17.0)
Hemoglobin: 9.5 g/dL — ABNORMAL LOW (ref 13.0–17.0)
O2 Saturation: 88 %
O2 Saturation: 86 %
Patient temperature: 97.8
Patient temperature: 100.1
Potassium: 5.4 mmol/L — ABNORMAL HIGH (ref 3.5–5.1)
Potassium: 5.2 mmol/L — ABNORMAL HIGH (ref 3.5–5.1)
Sodium: 138 mmol/L (ref 135–145)
Sodium: 137 mmol/L (ref 135–145)
TCO2: 16 mmol/L — ABNORMAL LOW (ref 22–32)
TCO2: 18 mmol/L — ABNORMAL LOW (ref 22–32)
pCO2 arterial: 44.2 mmHg (ref 32.0–48.0)
pCO2 arterial: 40 mmHg (ref 32.0–48.0)
pH, Arterial: 7.139 — CL (ref 7.350–7.450)
pH, Arterial: 7.225 — ABNORMAL LOW (ref 7.350–7.450)
pO2, Arterial: 68 mmHg — ABNORMAL LOW (ref 83.0–108.0)
pO2, Arterial: 64 mmHg — ABNORMAL LOW (ref 83.0–108.0)

## 2021-10-27 LAB — PROCALCITONIN: Procalcitonin: 28.01 ng/mL

## 2021-10-27 LAB — GLUCOSE, CAPILLARY
Glucose-Capillary: 100 mg/dL — ABNORMAL HIGH (ref 70–99)
Glucose-Capillary: 101 mg/dL — ABNORMAL HIGH (ref 70–99)
Glucose-Capillary: 124 mg/dL — ABNORMAL HIGH (ref 70–99)
Glucose-Capillary: 154 mg/dL — ABNORMAL HIGH (ref 70–99)
Glucose-Capillary: 194 mg/dL — ABNORMAL HIGH (ref 70–99)
Glucose-Capillary: 247 mg/dL — ABNORMAL HIGH (ref 70–99)
Glucose-Capillary: 254 mg/dL — ABNORMAL HIGH (ref 70–99)

## 2021-10-27 LAB — MAGNESIUM: Magnesium: 1.5 mg/dL — ABNORMAL LOW (ref 1.7–2.4)

## 2021-10-27 LAB — PHOSPHORUS: Phosphorus: 3.1 mg/dL (ref 2.5–4.6)

## 2021-10-27 LAB — TRIGLYCERIDES: Triglycerides: 186 mg/dL — ABNORMAL HIGH (ref <150)

## 2021-10-27 MED ORDER — PROSOURCE TF PO LIQD
90.0000 mL | Freq: Two times a day (BID) | ORAL | Status: DC
  Administered 2021-10-27 – 2021-11-06 (×20): 90 mL
  Filled 2021-10-27 (×20): qty 90

## 2021-10-27 MED ORDER — VITAL 1.5 CAL PO LIQD
1000.0000 mL | ORAL | Status: DC
  Administered 2021-10-27 – 2021-10-28 (×2): 1000 mL

## 2021-10-27 MED ORDER — SODIUM CHLORIDE 0.9 % IV SOLN: INTRAVENOUS | Status: DC

## 2021-10-27 MED ORDER — LINEZOLID 600 MG/300ML IV SOLN
600.0000 mg | Freq: Two times a day (BID) | INTRAVENOUS | Status: DC
  Administered 2021-10-27 – 2021-10-28 (×2): 600 mg via INTRAVENOUS
  Filled 2021-10-27 (×3): qty 300

## 2021-10-27 MED ORDER — POTASSIUM CHLORIDE 20 MEQ PO PACK
20.0000 meq | PACK | ORAL | Status: AC
  Administered 2021-10-27 (×2): 20 meq
  Filled 2021-10-27 (×2): qty 1

## 2021-10-27 MED ORDER — SODIUM CHLORIDE 0.9 % IV SOLN
2.0000 g | INTRAVENOUS | Status: DC
  Administered 2021-10-27 – 2021-10-30 (×4): 2 g via INTRAVENOUS
  Filled 2021-10-27 (×5): qty 20

## 2021-10-27 MED ORDER — MAGNESIUM SULFATE 4 GM/100ML IV SOLN
4.0000 g | Freq: Once | INTRAVENOUS | Status: AC
  Administered 2021-10-27: 4 g via INTRAVENOUS
  Filled 2021-10-27: qty 100

## 2021-10-27 MED ORDER — ADULT MULTIVITAMIN W/MINERALS CH
1.0000 | ORAL_TABLET | Freq: Every day | ORAL | Status: DC
  Administered 2021-10-28 – 2021-12-04 (×36): 1
  Filled 2021-10-27 (×37): qty 1

## 2021-10-27 MED ORDER — SODIUM CHLORIDE 0.9 % IV SOLN
500.0000 mg | INTRAVENOUS | Status: DC
  Administered 2021-10-27 – 2021-10-30 (×4): 500 mg via INTRAVENOUS
  Filled 2021-10-27 (×4): qty 500

## 2021-10-27 MED ORDER — POTASSIUM CHLORIDE 10 MEQ/100ML IV SOLN
10.0000 meq | INTRAVENOUS | Status: AC
  Administered 2021-10-27 (×4): 10 meq via INTRAVENOUS
  Filled 2021-10-27 (×2): qty 100

## 2021-10-27 MED ORDER — ACETAMINOPHEN 160 MG/5ML PO SOLN
650.0000 mg | ORAL | Status: DC | PRN
  Filled 2021-10-27: qty 20.3

## 2021-10-27 NOTE — Progress Notes (Signed)
Neurology Progress Note   S:// Seen and examined.   cEEG with burst suppression pattern, no seizures Spiked fever overnight, CXR c/f PNA, started on empiric coverage with ceftriaxone and azithromycin HSV PCR still pending; remains on acyclovir   O:// Current vital signs: BP (!) 113/51   Pulse 82   Temp (!) 97.5 F (36.4 C) (Oral)   Resp (!) 21   Ht _0  (1.778 m)   Wt 80.3 kg   SpO2 (!) 87%   BMI 25.40 kg/m  Vital signs in last 24 hours: Temp:  [97.5 F (36.4 C)-102.2 F (39 C)] 97.5 F (36.4 C) (11/04 1200) Pulse Rate:  [73-114] 82 (11/04 1300) Resp:  [16-29] 21 (11/04 1300) BP: (86-161)/(45-73) 113/51 (11/04 1300) SpO2:  [87 %-100 %] 87 % (11/04 1351) FiO2 (%):  [40 %-100 %] 100 % (11/04 1351) Weight:  [80.3 kg] 80.3 kg (11/04 0500) General: Sedated on propofol and Versed drips, intubated HEENT: Normocephalic/atraumatic CVs: Regular rate rhythm Respiratory: Vented Abdomen nondistended nontender Extremities warm well perfused Neurological exam Sedated intubated-on propofol and Versed drips Remains comatose Right pupil sluggishly reactive Breathing with the ventilator No response to noxious stimulation   Medications  Current Facility-Administered Medications:    0.9 %  sodium chloride infusion, 250 mL, Intravenous, Continuous, Elsworth Soho, Rakesh V, MD   0.9 %  sodium chloride infusion, , Intravenous, PRN, Candee Furbish, MD, Stopped at 10/27/21 1225   0.9 %  sodium chloride infusion, , Intravenous, Continuous, Freda Jackson B, MD, Last Rate: 50 mL/hr at 10/27/21 1300, Infusion Verify at 10/27/21 1300   acetaminophen (TYLENOL) 160 MG/5ML solution 650 mg, 650 mg, Oral, Q4H PRN, Freddi Starr, MD   acyclovir (ZOVIRAX) 800 mg in dextrose 5 % 150 mL IVPB, 800 mg, Intravenous, Q12H, Candee Furbish, MD, Stopped at 10/27/21 1116   azithromycin (ZITHROMAX) 500 mg in sodium chloride 0.9 % 250 mL IVPB, 500 mg, Intravenous, Q24H, Freda Jackson B, MD, Last Rate: 250  mL/hr at 10/27/21 1300, Infusion Verify at 10/27/21 1300   cefTRIAXone (ROCEPHIN) 2 g in sodium chloride 0.9 % 100 mL IVPB, 2 g, Intravenous, Q24H, Freddi Starr, MD, Stopped at 10/27/21 1213   chlorhexidine gluconate (MEDLINE KIT) (PERIDEX) 0.12 % solution 15 mL, 15 mL, Mouth Rinse, BID, Kara Mead V, MD, 15 mL at 10/27/21 0845   Chlorhexidine Gluconate Cloth 2 % PADS 6 each, 6 each, Topical, Daily, Kara Mead V, MD, 6 each at 10/27/21 0000   docusate (COLACE) 50 MG/5ML liquid 100 mg, 100 mg, Per Tube, BID PRN, Candee Furbish, MD   enoxaparin (LOVENOX) injection 40 mg, 40 mg, Subcutaneous, Q24H, Candee Furbish, MD, 40 mg at 10/27/21 0100   feeding supplement (PROSource TF) liquid 45 mL, 45 mL, Per Tube, BID, Candee Furbish, MD, 45 mL at 10/27/21 5364   feeding supplement (VITAL HIGH PROTEIN) liquid 1,000 mL, 1,000 mL, Per Tube, Q24H, Candee Furbish, MD, 1,000 mL at 68/03/21 2248   folic acid injection 1 mg, 1 mg, Intravenous, Daily, Candee Furbish, MD, 1 mg at 10/27/21 0930   insulin aspart (novoLOG) injection 0-15 Units, 0-15 Units, Subcutaneous, Q4H, Candee Furbish, MD, 3 Units at 10/27/21 1148   ipratropium-albuterol (DUONEB) 0.5-2.5 (3) MG/3ML nebulizer solution 3 mL, 3 mL, Nebulization, Q6H, Harris, Whitney D, NP, 3 mL at 10/27/21 1301   lacosamide (VIMPAT) 200 mg in sodium chloride 0.9 % 25 mL IVPB, 200 mg, Intravenous, Q12H, Greta Doom, MD, Stopped at 10/27/21  1136   levETIRAcetam (KEPPRA) IVPB 1500 mg/ 100 mL premix, 1,500 mg, Intravenous, Q12H, Rosalin Hawking, MD, Stopped at 10/27/21 1009   MEDLINE mouth rinse, 15 mL, Mouth Rinse, 10 times per day, Rigoberto Noel, MD, 15 mL at 10/27/21 1309   midazolam PF (VERSED) injection 15 mg, 15 mg, Intravenous, Q5 min PRN **AND** midazolam (VERSED) bolus via infusion 15 mg, 15 mg, Intravenous, Q5 min PRN, 15 mg at 10/23/21 1902 **AND** midazolam (VERSED) 100 mg/100 mL (1 mg/mL) premix infusion, 5 mg/hr, Intravenous, Continuous,  Amie Portland, MD, Last Rate: 5 mL/hr at 10/27/21 1300, 5 mg/hr at 10/27/21 1300   multivitamin liquid 15 mL, 15 mL, Per Tube, Daily, Candee Furbish, MD, 15 mL at 10/27/21 0929   norepinephrine (LEVOPHED) 52m in 2533mpremix infusion, 2-10 mcg/min, Intravenous, Titrated, SmCandee FurbishMD, Last Rate: 15 mL/hr at 10/27/21 1300, 4 mcg/min at 10/27/21 1300   pantoprazole (PROTONIX) injection 40 mg, 40 mg, Intravenous, Q24H, XuRosalin HawkingMD, 40 mg at 10/27/21 0929   PHENObarbital (LUMINAL) injection 65 mg, 65 mg, Intravenous, BID, ArAmie PortlandMD, 65 mg at 10/27/21 1035   polyethylene glycol (MIRALAX / GLYCOLAX) packet 17 g, 17 g, Per Tube, Daily PRN, SmCandee FurbishMD   propofol (DIPRIVAN) 1000 MG/100ML infusion, 5-80 mcg/kg/min, Intravenous, Continuous, Horton, Kristie M, DO, Last Rate: 14.26 mL/hr at 10/27/21 1347, 30 mcg/kg/min at 10/27/21 1347   [COMPLETED] thiamine 50028mn normal saline (27m43mVPB, 500 mg, Intravenous, Q8H, Stopped at 10/26/21 0552 **FOLLOWED BY** thiamine (B-1) 250 mg in sodium chloride 0.9 % 50 mL IVPB, 250 mg, Intravenous, Daily, Stopped at 10/27/21 1210 **FOLLOWED BY** [START ON 11/01/2021] thiamine (B-1) injection 100 mg, 100 mg, Intravenous, Daily, ArorAmie Portland Labs CBC    Component Value Date/Time   WBC 7.3 10/27/2021 0811   RBC 3.88 (L) 10/27/2021 0811   HGB 10.8 (L) 10/27/2021 0811   HGB 12.9 (L) 05/12/2021 0921   HCT 34.4 (L) 10/27/2021 0811   HCT 38.7 05/12/2021 0921   PLT 187 10/27/2021 0811   PLT 295 05/12/2021 0921   MCV 88.7 10/27/2021 0811   MCV 86 05/12/2021 0921   MCH 27.8 10/27/2021 0811   MCHC 31.4 10/27/2021 0811   RDW 16.5 (H) 10/27/2021 0811   RDW 15.8 (H) 05/12/2021 0921   LYMPHSABS 0.7 10/27/2021 0811   MONOABS 0.4 10/27/2021 0811   EOSABS 0.0 10/27/2021 0811   BASOSABS 0.1 10/27/2021 0811    CMP     Component Value Date/Time   NA 142 10/27/2021 0502   NA 139 05/12/2021 0921   K 3.3 (L) 10/27/2021 0502   CL 115 (H)  10/27/2021 0502   CO2 17 (L) 10/27/2021 0502   GLUCOSE 128 (H) 10/27/2021 0502   BUN 11 10/27/2021 0502   BUN 10 05/12/2021 0921   CREATININE 1.59 (H) 10/27/2021 0502   CREATININE 1.01 12/09/2014 0926   CALCIUM 7.7 (L) 10/27/2021 0502   PROT 5.1 (L) 10/27/2021 0502   PROT 7.8 05/12/2021 0921   ALBUMIN 2.2 (L) 10/27/2021 0502   ALBUMIN 4.9 (H) 05/12/2021 0921   AST 28 10/27/2021 0502   ALT 36 10/27/2021 0502   ALKPHOS 67 10/27/2021 0502   BILITOT 0.5 10/27/2021 0502   BILITOT 0.2 05/12/2021 0921   GFRNONAA 47 (L) 10/27/2021 0502   GFRAA 80 12/07/2020 1529   UDS negative on arrival EtOH undetectable on arrival  CSF studies with glucose 66, RBC 450, WBC 3, protein 152   EEG this  admission  Spot EEG 10/31: -Background suppression, generalized - Intermittent slow, generalized  LTM 10/23/2021 1806 to 10/24/2021 1646 This study showed two seizures without clinical signs arising from right anterior temporal region on 10/24/2021 at 0726 and 0813, lasting about 1 minute each.  Additionally there are lateralized periodic discharges with overriding fast activity which is on the ictal-interictal continuum with high potential for seizure recurrence. There is also profound diffuse encephalopathy, nonspecific etiology but likely related to sedation.   LTM 10/24/2021 1846 to 10/25/2021 1846 This study showed multiple seizures without clinical signs arising from right anterior temporal region as described above, lasting about 30 seconds to 1 minute. Last seizure was on 10/25/2021 at 0933. Additionally there are lateralized periodic discharges with overriding fast activity which is on the ictal-interictal continuum with high potential for seizure recurrence. There is also profound diffuse encephalopathy, nonspecific etiology but likely related to sedation.   LTM 10/25/2021 1846 to 10/26/2021 1846 Generalized burst suppression. No seizures were seen during this study. Patient event button was pressed on  10/26/2021 at 1741. Per RN, patient was shivering while placing catheter. Concomitant eeg did not show any eeg change to suggest seizure. This was most likely not an epileptic event. EEG appears to be improving compared to previous day.  LTM 10/26/2021 1846 to 10/27/2021 0800 Generalized burst suppression. No seizures were seen during this study.     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  CT perfusion with no acute infarction or perfusion deficit.  MR brain w+w/o - NAD  Assessment: 70 year old man with history of hypertension, peripheral vascular disease, congenital truck,, cataract, tobacco abuse, left frontal and occipital traumatic contusion with small left frontal SDH in 2019, history of alcohol abuse who presented as a code stroke for right gaze and left-sided weakness with questionable seizure activity reported by bystander with blood pressures elevated on scene and in the ER with repeat generalized tonic-clonic seizure with eye deviation to the left x2 in the emergency room for which she received Ativan and Keppra load and after the second episode also loaded with fosphenytoin. He has been in burst suppression x48 hrs with no seizures during that time. Today will continue sedation wean though slowly given new PNA. On ceftriaxone and azithromycin. HSV CSF PCR pending, remains on acyclovir.  MRI brain unremarkable.  Impression: -Seizures (has been evaluated by EEG in 2019 for possible syncope but no documented h/o seizures in the past), status epilepticus with ensuing Todd's paralysis - Currently in burst suppression on EEG, no sz past 48 hrs -PLEDs initially-resolved, CSF has increased protein - ?HSV encephalitis -Evaluate for stroke due to presenting with stroke like symptoms -Hypertensive emergency  Recommendations: Continue current AED regimen - vimpat 231m bid, keppra 15045mbid, phenobarb 6592mid EEG with burst suppression - continue to slowly wean versed and propofol over next 48 hrs (slowly given new infection) Continue LTM until off sedation without recurrent seizures, after that can follow clinical exam Continue Acyclovir till HSV  PCR was negative Continue ceftriaxone and azithromycin for empiric coverage of PNA, avoid abx that can decrease seizure threshold Seizure precautions  D/w Dr. Erin Fulling  This patient is critically ill and at significant risk of neurological worsening, death and care requires constant monitoring of vital signs, hemodynamics,respiratory and cardiac monitoring, neurological assessment, discussion with family, other specialists and medical decision making of high complexity. I spent 40 minutes of neurocritical care time  in the care of  this patient. This was time spent independent of any time provided by nurse practitioner or PA.  Su Monks, MD [facility] [phone]  If 7pm- 7am, please page neurology on call as listed in [place].

## 2021-10-27 NOTE — Procedures (Addendum)
MRN: [id number]  Epilepsy Attending: Lora Havens  Referring Physician/Provider: Dr Rosalin Hawking Duration: 10/26/2021 1846 to 10/27/2021 1846   Patient history: 70 y.o. male with PMH of HTN, PVD, congenital glaucoma, cataract, smoker, left frontal and occipital traumatic contusion with small left frontal SDH in 2019 presented to ED for right gaze and left sided weakness with questionable seizure activity. EEG to evaluate for seizure   Level of alertness: comatose   AEDs during EEG study: LEV,LCM, Phenobarb, Propofol, Versed   Technical aspects: This EEG study was done with scalp electrodes positioned according to the 10-20 International system of electrode placement. Electrical activity was acquired at a sampling rate of 500Hz  and reviewed with a high frequency filter of 70Hz  and a low frequency filter of 1Hz . EEG data were recorded continuously and digitally stored.    Description: EEG showed burst suppression with generalized bursts of 5-6hz  polymorphic theta slowing lasting 1-3 seconds as well as eeg suppression lasting 2-5 seconds. Hyperventilation and photic stimulation were not performed.      ABNORMALITY -Burst suppression, generalized   IMPRESSION: This study is suggestive of profound diffuse encephalopathy, nonspecific etiology but likely related to sedation. No seizures were seen during this study.    Jeffrey Mullen [name]

## 2021-10-27 NOTE — Progress Notes (Signed)
NAME:  Jeffrey Mullen, MRN:  [id number], DOB:  1951-05-12, LOS: 4 ADMISSION DATE:  10/23/2021, CONSULTATION DATE:  10/31 REFERRING MD:  Dr. Dina Rich, CHIEF COMPLAINT:  AMS   History of Present Illness:  70 y/o M who presented to [facility] ER on 10/31 via EMS with reports of altered mental status.  Given  right sided gaze, altered and left sided weakness, code stoke called. The patient was taken emergently to CT for imaging which was negative for LVO.  While in the [place], he had a witnessed seizure.    PCCM called for ICU admission.   Chart review shows he was seen in the ER on 10/4 after passing out at the [employer].  Physical exam was negative at the time.  Labs negative.  No further imaging obtained.   Pertinent  Medical History  Alcohol Use - unclear quantity Anxiety / Depression  Congenital blindness of left eye, cataract on R Hepatitis C+, treated in 2016 Seizures   Significant Hospital Events: Including procedures, antibiotic start and stop dates in addition to other pertinent events   10/31 Admit with AMS, seizure, hypertensive emergency, AKI 11/1 MRI brain no acute intracranial process. No etiology is seen for the patient's seizure. 11/02 bedside LP attempted but unsuccessful  11/03  No seizures seen on EEG overnight  11/04 continued attempts to wean sedation to assess for seizure reoccurrence   Interim History / Subjective:  Sedated on vent  Slight increase in urine output   Objective   Blood pressure (!) 86/45, pulse 92, temperature 98.6 F (37 C), temperature source Oral, resp. rate 20, height 5\' 10"  (1.778 m), weight 80.3 kg, SpO2 93 %.    Vent Mode: PRVC FiO2 (%):  [40 %-70 %] 70 % Set Rate:  [16 bmp] 16 bmp Vt Set:  [440 mL] 440 mL PEEP:  [5 cmH20] 5 cmH20 Plateau Pressure:  [15 AOZ30-86 cmH20] 15 cmH20   Intake/Output Summary (Last 24 hours) at 10/27/2021 0720 Last data filed at 10/27/2021 0600 Gross per 24 hour  Intake 5007.16 ml  Output 1850 ml  Net  3157.16 ml    Filed Weights   10/25/21 0355 10/26/21 0500 10/27/21 0500  Weight: 80.3 kg 80 kg 80.3 kg    Examination: General: Acute on chronically ill appearing middle aged male lying in bed on mechanical vent in NAD HEENT: ETT, MM pink/moist, PERRL,  Neuro: Deeply sedated on vent CV: s1s2 regular rate and rhythm, no murmur, rubs, or gallops,  PULM:  Clear to ascultation bilaterally, no increased work of breathing, tolerating vent well  GI: soft, bowel sounds active in all 4 quadrants, non-tender, non-distended, tolerating TF Extremities: warm/dry, no edema  Skin: no rashes or lesions  Resolved Hospital Problem list     Assessment & Plan:  Ongoing status epilepticus -No seizure seen on EEG overnight, improved from day prior  History of EtOH abuse, prior SDH -LVO ruled out on CT.  UDS neg.  MRI benign.  Ongoing cEEG. P: Primary management per neurology  Maintain neuro protective measures; goal for eurothermia, euglycemia, eunatermia, normoxia, and PCO2 goal of 35-40 Nutrition and bowel regiment  Seizure precautions  AEDs per neurology  Aspirations precautions  EEG per neurology  Wean sedation per neurology  Follow LP results  Continue high dose thiamine   Acute Hypoxic Respiratory Failure  -in the setting of status  P: Continue ventilator support with lung protective strategies  Wean PEEP and FiO2 for sats greater than 90%. Head of bed  elevated 30 degrees. Plateau pressures less than 30 cm H20.  Follow intermittent chest x-ray and ABG.   Unable to SBT due to deep sedation  Ensure adequate pulmonary hygiene  Follow cultures  VAP bundle in place  PAD protocol  AKI  -Baseline sr cr ~0.9 to 1  -Concern for Acyclovir as causative agent  -FENa 0.3% indicating pre-renal  P: Follow renal function  Monitor urine output Trend Bmet Avoid nephrotoxins Ensure adequate renal perfusion  Renally dose Acyclovir   Hypoglycemia P: Continue TF SSI q4  Complex social  dynamics -May need help figuring out POA  Best Practice (right click and "Reselect all SmartList Selections" daily)  Diet/type: NPO, TF after  DVT prophylaxis: LMWH GI prophylaxis: PPI Lines: N/A Foley:  dc foley Code Status:  full code Last date of multidisciplinary goals of care discussion - pending, complex family dynamics    CRITICAL CARE Performed by: Jammie Clink D. Harris  Total critical care time: 38 minutes  Critical care time was exclusive of separately billable procedures and treating other patients.  Critical care was necessary to treat or prevent imminent or life-threatening deterioration.  Critical care was time spent personally by me on the following activities: development of treatment plan with patient and/or surrogate as well as nursing, discussions with consultants, evaluation of patient's response to treatment, examination of patient, obtaining history from patient or surrogate, ordering and performing treatments and interventions, ordering and review of laboratory studies, ordering and review of radiographic studies, pulse oximetry and re-evaluation of patient's condition.  Valetta Mulroy D. Kenton Kingfisher, NP-C [facility] Personal contact information can be found on Amion  10/27/2021, 7:20 AM

## 2021-10-27 NOTE — Progress Notes (Signed)
Nutrition Follow-up  DOCUMENTATION CODES:   Not applicable  INTERVENTION:    Tube feeding via OG tube: Vital 1.5 at 50 ml/h (1200 ml per day) Prosource TF 90 ml BID  Provides 1960 kcal, 125 gm protein, 912 ml free water daily  MVI with minerals via tube  TF regimen and propofol at current rate providing 2337 total kcal/day   No BM, pt has PRNs, may need scheduled bowel regimen    NUTRITION DIAGNOSIS:   Inadequate oral intake related to inability to eat as evidenced by NPO status. Ongoing.   GOAL:   Patient will meet greater than or equal to 90% of their needs Met with TF.   MONITOR:   Vent status, Labs, Weight trends, TF tolerance, Skin, I & O's  REASON FOR ASSESSMENT:   Ventilator    ASSESSMENT:   Pt with PMH of hepatitis C, alcohol abuse and seizures admitted with with status epilepticus.  Pt discussed during ICU rounds and with RN.   Patient is currently intubated on ventilator support MV: 8.7 L/min Temp (24hrs), Avg:99.5 F (37.5 C), Min:97.5 F (36.4 C), Max:102.2 F (39 C)  Propofol: 14.3 ml/hr provides: 377 kcal  Medications reviewed and include: folic acid, liquid MVI, protonix, phenobarbital, thiamine Versed Levophed @ 4 mcg  Labs reviewed: K 3.3, magnesium 1.5 CBG's: 124-154  16 F OG tube; tip distal stomach per xray   Current TF: Vital High Protein @ 35 ml/hr with 45 ml ProSource TF BID Provides: 920 kcal, 95 grams protein   Diet Order:   Diet Order             Diet NPO time specified  Diet effective now                   EDUCATION NEEDS:   Not appropriate for education at this time  Skin:  Skin Assessment: Reviewed RN Assessment  Last BM:  Unknown  Height:   Ht Readings from Last 1 Encounters:  10/27/21 '5\' 10"'  (1.778 m)    Weight:   Wt Readings from Last 1 Encounters:  10/27/21 80.3 kg    Ideal Body Weight:  75.5 kg  BMI:  Body mass index is 25.4 kg/m.  Estimated Nutritional Needs:   Kcal:   2100  Protein:  95-110 grams  Fluid:  > 1.6 L  Suzane Vanderweide P., RD, LDN, CNSC See AMiON for contact information

## 2021-10-27 NOTE — Progress Notes (Signed)
vLTM EEG maint complete. No skin breakdown at [place]. Continue to monitor

## 2021-10-27 NOTE — Progress Notes (Signed)
[facility] ADULT ICU REPLACEMENT PROTOCOL   The patient does apply for the [facility] Adult ICU Electrolyte Replacment Protocol based on the criteria listed below:   1.Exclusion criteria: TCTS patients, ECMO patients, and Dialysis patients 2. Is GFR >/= 30 ml/min? Yes.    Patient's GFR today is 47 3. Is SCr </= 2? Yes.   Patient's SCr is 1.59 mg/dL 4. Did SCr increase >/= 0.5 in 24 hours? No. 5.Pt's weight >40kg  Yes.   6. Abnormal electrolyte(s): K+ 3.3, mag 1.5  7. Electrolytes replaced per protocol 8.  Call MD STAT for K+ </= 2.5, Phos </= 1, or Mag </= 1 Physician:  n/a  Darlys Gales 10/27/2021 6:19 AM

## 2021-10-27 NOTE — Progress Notes (Signed)
Inpatient Diabetes Program Recommendations  AACE/ADA: New Consensus Statement on Inpatient Glycemic Control (2015)  Target Ranges:  Prepandial:   less than 140 mg/dL      Peak postprandial:   less than 180 mg/dL (1-2 hours)      Critically ill patients:  140 - 180 mg/dL   Lab Results  Component Value Date   GLUCAP 124 (H) 10/27/2021   HGBA1C 6.0 (H) 10/26/2021    Review of Glycemic Control Results for KEANAN, MELANDER (MRN [id number]) as of 10/27/2021 10:36  Ref. Range 10/26/2021 19:30 10/26/2021 23:20 10/27/2021 00:03 10/27/2021 03:34 10/27/2021 07:47  Glucose-Capillary Latest Ref Range: 70 - 99 mg/dL 91 68 (L) 101 (H) 100 (H) 124 (H)   Diabetes history: PreDM Current orders for Inpatient glycemic control: Novolog 0-15 units Q4H  Inpatient Diabetes Program Recommendations:    Consider changing correction to Novolog 0-6 units Q4H due to mild hypoglycemia with current correction scale.   Thanks, Bronson Curb, MSN, RNC-OB Diabetes Coordinator [phone] (8a-5p)

## 2021-10-27 NOTE — Progress Notes (Signed)
Order to reduce Versed by 1mg  every six hours.

## 2021-10-27 NOTE — Progress Notes (Signed)
Notified Elink of current ABG results. Waiting response.

## 2021-10-27 NOTE — Progress Notes (Addendum)
[facility] Progress Note Patient Name: VASILIS LUHMAN DOB: 04-Apr-1951 MRN: [id number]   Date of Service  10/27/2021  HPI/Events of Note  Fever to 102.1 F - Nursing request for Tylenol. AST and ALT are both elevated. Creatinine = 1.52. Will avoid Tylenol and Motrin. Currently on empiric Acyclovir.   eICU Interventions  Plan: Ice packs PRN.  Defer further infectious w/u to PCCM day rounding team.      Intervention Category Major Interventions: Other:  Lysle Dingwall 10/27/2021, 12:37 AM

## 2021-10-27 NOTE — Progress Notes (Signed)
Pharmacy Antibiotic Note  Jeffrey Mullen is a 70 y.o. 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  Pharmacy has been consulted for acyclovir dosing. Patient has now developed fevers overnight with concern for pneumonia.  Empiric ceftriaxone and azithromycin have been started for CAP coverage given negative MRSA screen.  Scr up to 1.59. Acyclovir dose adjusted from 795 mg q 8h to 800 mg q12h for renal function. HSV PCR still pending.    Plan: Acyclovir 800 mg (10mg /kg) IV q 12h Continue supportive NS at 125 ml/hour Ceftriaxone 2g IV daily Azithromycin 500 mg IV daily Monitor clinical progress, c/s, renal function, fluid status F/u Neurology plans, LOT, CBC w/ diff F/u HSV PCR  Height: 5\' 10"  (177.8 cm) Weight: 80.3 kg (177 lb 0.5 oz) IBW/kg (Calculated) : 73  Temp (24hrs), Avg:99.6 F (37.6 C), Min:98.2 F (36.8 C), Max:102.2 F (39 C)  Recent Labs  Lab 10/23/21 1415 10/23/21 1417 10/24/21 0615 10/24/21 2236 10/25/21 0445 10/26/21 0356 10/27/21 0502 10/27/21 0811  WBC 12.6*  --  8.2  --   --   --  5.8 7.3  CREATININE 1.46*   < > 1.02 1.08 1.12 1.52* 1.59*  --    < > = values in this interval not displayed.     Estimated Creatinine Clearance: 46.5 mL/min (A) (by C-G formula based on SCr of 1.59 mg/dL (H)).    No Active Allergies   Cathrine Muster, PharmD PGY2 Cardiology Pharmacy Resident 10/27/2021  10:03 AM  Please check [email or web address] for unit-specific pharmacy phone numbers.

## 2021-10-28 ENCOUNTER — Inpatient Hospital Stay (HOSPITAL_COMMUNITY): Payer: Medicare Other

## 2021-10-28 DIAGNOSIS — N179 Acute kidney failure, unspecified: Secondary | ICD-10-CM | POA: Diagnosis not present

## 2021-10-28 DIAGNOSIS — J9601 Acute respiratory failure with hypoxia: Secondary | ICD-10-CM | POA: Diagnosis not present

## 2021-10-28 DIAGNOSIS — G40901 Epilepsy, unspecified, not intractable, with status epilepticus: Secondary | ICD-10-CM | POA: Diagnosis not present

## 2021-10-28 DIAGNOSIS — R569 Unspecified convulsions: Secondary | ICD-10-CM | POA: Diagnosis not present

## 2021-10-28 LAB — POCT I-STAT 7, (LYTES, BLD GAS, ICA,H+H)
Acid-base deficit: 9 mmol/L — ABNORMAL HIGH (ref 0.0–2.0)
Acid-base deficit: 7 mmol/L — ABNORMAL HIGH (ref 0.0–2.0)
Bicarbonate: 17.5 mmol/L — ABNORMAL LOW (ref 20.0–28.0)
Bicarbonate: 18.9 mmol/L — ABNORMAL LOW (ref 20.0–28.0)
Calcium, Ion: 1.1 mmol/L — ABNORMAL LOW (ref 1.15–1.40)
Calcium, Ion: 1.06 mmol/L — ABNORMAL LOW (ref 1.15–1.40)
HCT: 27 % — ABNORMAL LOW (ref 39.0–52.0)
HCT: 25 % — ABNORMAL LOW (ref 39.0–52.0)
Hemoglobin: 9.2 g/dL — ABNORMAL LOW (ref 13.0–17.0)
Hemoglobin: 8.5 g/dL — ABNORMAL LOW (ref 13.0–17.0)
O2 Saturation: 93 %
O2 Saturation: 98 %
Patient temperature: 100.1
Potassium: 5.4 mmol/L — ABNORMAL HIGH (ref 3.5–5.1)
Potassium: 5 mmol/L (ref 3.5–5.1)
Sodium: 137 mmol/L (ref 135–145)
Sodium: 138 mmol/L (ref 135–145)
TCO2: 19 mmol/L — ABNORMAL LOW (ref 22–32)
TCO2: 20 mmol/L — ABNORMAL LOW (ref 22–32)
pCO2 arterial: 39 mmHg (ref 32.0–48.0)
pCO2 arterial: 40.3 mmHg (ref 32.0–48.0)
pH, Arterial: 7.265 — ABNORMAL LOW (ref 7.350–7.450)
pH, Arterial: 7.279 — ABNORMAL LOW (ref 7.350–7.450)
pO2, Arterial: 79 mmHg — ABNORMAL LOW (ref 83.0–108.0)
pO2, Arterial: 113 mmHg — ABNORMAL HIGH (ref 83.0–108.0)

## 2021-10-28 LAB — HSV 1/2 PCR, CSF
HSV-1 DNA: NEGATIVE
HSV-2 DNA: NEGATIVE

## 2021-10-28 LAB — CBC
HCT: 28 % — ABNORMAL LOW (ref 39.0–52.0)
Hemoglobin: 8.8 g/dL — ABNORMAL LOW (ref 13.0–17.0)
MCH: 27.2 pg (ref 26.0–34.0)
MCHC: 31.4 g/dL (ref 30.0–36.0)
MCV: 86.4 fL (ref 80.0–100.0)
Platelets: 142 10*3/uL — ABNORMAL LOW (ref 150–400)
RBC: 3.24 MIL/uL — ABNORMAL LOW (ref 4.22–5.81)
RDW: 16.9 % — ABNORMAL HIGH (ref 11.5–15.5)
WBC: 13.7 10*3/uL — ABNORMAL HIGH (ref 4.0–10.5)
nRBC: 0 % (ref 0.0–0.2)

## 2021-10-28 LAB — COMPREHENSIVE METABOLIC PANEL
ALT: 29 U/L (ref 0–44)
AST: 51 U/L — ABNORMAL HIGH (ref 15–41)
Albumin: 2 g/dL — ABNORMAL LOW (ref 3.5–5.0)
Alkaline Phosphatase: 72 U/L (ref 38–126)
Anion gap: 10 (ref 5–15)
BUN: 29 mg/dL — ABNORMAL HIGH (ref 8–23)
CO2: 18 mmol/L — ABNORMAL LOW (ref 22–32)
Calcium: 7.5 mg/dL — ABNORMAL LOW (ref 8.9–10.3)
Chloride: 109 mmol/L (ref 98–111)
Creatinine, Ser: 2.39 mg/dL — ABNORMAL HIGH (ref 0.61–1.24)
GFR, Estimated: 29 mL/min — ABNORMAL LOW (ref >60)
Glucose, Bld: 231 mg/dL — ABNORMAL HIGH (ref 70–99)
Potassium: 5.4 mmol/L — ABNORMAL HIGH (ref 3.5–5.1)
Sodium: 137 mmol/L (ref 135–145)
Total Bilirubin: 0.2 mg/dL — ABNORMAL LOW (ref 0.3–1.2)
Total Protein: 5.3 g/dL — ABNORMAL LOW (ref 6.5–8.1)

## 2021-10-28 LAB — GLUCOSE, CAPILLARY
Glucose-Capillary: 237 mg/dL — ABNORMAL HIGH (ref 70–99)
Glucose-Capillary: 209 mg/dL — ABNORMAL HIGH (ref 70–99)
Glucose-Capillary: 244 mg/dL — ABNORMAL HIGH (ref 70–99)
Glucose-Capillary: 221 mg/dL — ABNORMAL HIGH (ref 70–99)
Glucose-Capillary: 155 mg/dL — ABNORMAL HIGH (ref 70–99)
Glucose-Capillary: 164 mg/dL — ABNORMAL HIGH (ref 70–99)

## 2021-10-28 LAB — CREATININE, URINE, RANDOM: Creatinine, Urine: 193.33 mg/dL

## 2021-10-28 LAB — BASIC METABOLIC PANEL
Anion gap: 7 (ref 5–15)
Anion gap: 10 (ref 5–15)
BUN: 25 mg/dL — ABNORMAL HIGH (ref 8–23)
BUN: 25 mg/dL — ABNORMAL HIGH (ref 8–23)
CO2: 16 mmol/L — ABNORMAL LOW (ref 22–32)
CO2: 15 mmol/L — ABNORMAL LOW (ref 22–32)
Calcium: 7.4 mg/dL — ABNORMAL LOW (ref 8.9–10.3)
Calcium: 7.5 mg/dL — ABNORMAL LOW (ref 8.9–10.3)
Chloride: 111 mmol/L (ref 98–111)
Chloride: 111 mmol/L (ref 98–111)
Creatinine, Ser: 2.15 mg/dL — ABNORMAL HIGH (ref 0.61–1.24)
Creatinine, Ser: 2.26 mg/dL — ABNORMAL HIGH (ref 0.61–1.24)
GFR, Estimated: 33 mL/min — ABNORMAL LOW (ref >60)
GFR, Estimated: 31 mL/min — ABNORMAL LOW (ref >60)
Glucose, Bld: 247 mg/dL — ABNORMAL HIGH (ref 70–99)
Glucose, Bld: 246 mg/dL — ABNORMAL HIGH (ref 70–99)
Potassium: 5.6 mmol/L — ABNORMAL HIGH (ref 3.5–5.1)
Potassium: 5.7 mmol/L — ABNORMAL HIGH (ref 3.5–5.1)
Sodium: 134 mmol/L — ABNORMAL LOW (ref 135–145)
Sodium: 136 mmol/L (ref 135–145)

## 2021-10-28 LAB — TRIGLYCERIDES: Triglycerides: 219 mg/dL — ABNORMAL HIGH (ref <150)

## 2021-10-28 LAB — SODIUM, URINE, RANDOM: Sodium, Ur: <10 mmol/L

## 2021-10-28 LAB — MAGNESIUM: Magnesium: 2.1 mg/dL (ref 1.7–2.4)

## 2021-10-28 MED ORDER — FUROSEMIDE 10 MG/ML IJ SOLN
80.0000 mg | Freq: Once | INTRAMUSCULAR | Status: AC
  Administered 2021-10-28: 80 mg via INTRAVENOUS
  Filled 2021-10-28: qty 8

## 2021-10-28 MED ORDER — STERILE WATER FOR INJECTION IV SOLN
INTRAVENOUS | Status: DC
  Filled 2021-10-28: qty 1000

## 2021-10-28 MED ORDER — INSULIN ASPART 100 UNIT/ML IJ SOLN
4.0000 [IU] | INTRAMUSCULAR | Status: DC
  Administered 2021-10-28 – 2021-11-06 (×51): 4 [IU] via SUBCUTANEOUS

## 2021-10-28 MED ORDER — HEPARIN SODIUM (PORCINE) 5000 UNIT/ML IJ SOLN
5000.0000 [IU] | Freq: Three times a day (TID) | INTRAMUSCULAR | Status: DC
  Administered 2021-10-28 – 2021-12-14 (×138): 5000 [IU] via SUBCUTANEOUS
  Filled 2021-10-28 (×136): qty 1

## 2021-10-28 MED ORDER — FUROSEMIDE 10 MG/ML IJ SOLN
80.0000 mg | Freq: Two times a day (BID) | INTRAMUSCULAR | Status: DC
  Administered 2021-10-29 – 2021-10-31 (×5): 80 mg via INTRAVENOUS
  Filled 2021-10-28 (×5): qty 8

## 2021-10-28 MED ORDER — INSULIN GLARGINE-YFGN 100 UNIT/ML ~~LOC~~ SOLN
8.0000 [IU] | Freq: Every day | SUBCUTANEOUS | Status: DC
  Administered 2021-10-28: 8 [IU] via SUBCUTANEOUS
  Filled 2021-10-28 (×2): qty 0.08

## 2021-10-28 MED ORDER — SODIUM ZIRCONIUM CYCLOSILICATE 10 G PO PACK
10.0000 g | PACK | Freq: Once | ORAL | Status: AC
  Administered 2021-10-28: 10 g
  Filled 2021-10-28: qty 1

## 2021-10-28 NOTE — Progress Notes (Signed)
LTM maint complete - no skin breakdown under: Fp1 Fp2 F3  F7 moved EEG-ECG leads, ;applied paste to C4 C3 T7 T8 P7 [facility] monitored, Event button test confirmed by [facility].

## 2021-10-28 NOTE — Progress Notes (Addendum)
NAME:  Jeffrey Mullen, MRN:  [id number], DOB:  1951/04/25, LOS: 5 ADMISSION DATE:  10/23/2021, CONSULTATION DATE:  10/31 REFERRING MD:  Dr. Dina Rich, CHIEF COMPLAINT:  AMS   History of Present Illness:  70 y/o M who presented to [facility] ER on 10/31 via EMS with reports of altered mental status.  Given  right sided gaze, altered and left sided weakness, code stoke called. The patient was taken emergently to CT for imaging which was negative for LVO.  While in the [place], he had a witnessed seizure.    PCCM called for ICU admission.   Chart review shows he was seen in the ER on 10/4 after passing out at the [employer].  Physical exam was negative at the time.  Labs negative.  No further imaging obtained.   Pertinent  Medical History  Alcohol Use - unclear quantity Anxiety / Depression  Congenital blindness of left eye, cataract on R Hepatitis C+, treated in 2016 Seizures   Significant Hospital Events: Including procedures, antibiotic start and stop dates in addition to other pertinent events   10/31 Admit with AMS, seizure, hypertensive emergency, AKI 11/1 MRI brain no acute intracranial process. No etiology is seen for the patient's seizure. 11/02 bedside LP attempted but unsuccessful  11/03  No seizures seen on EEG overnight  11/04 weaning sedation to assess for seizure reoccurrence   Interim History / Subjective:  No acute events overnight.   PEEP increased to 10 and RR increased to 26. Bicarb drip started.  Urine output has decreased since yesterday with Cr rising.   Objective   Blood pressure (!) 125/45, pulse 62, temperature 100.1 F (37.8 C), temperature source Oral, resp. rate (!) 26, height 5\' 10"  (1.778 m), weight 80.3 kg, SpO2 95 %.    Vent Mode: PRVC FiO2 (%):  [70 %-100 %] 100 % Set Rate:  [16 bmp-26 bmp] 26 bmp Vt Set:  [440 mL] 440 mL PEEP:  [5 cmH20-10 cmH20] 10 cmH20 Plateau Pressure:  [11 cmH20-22 cmH20] 22 cmH20   Intake/Output Summary (Last 24 hours)  at 10/28/2021 0731 Last data filed at 10/28/2021 0600 Gross per 24 hour  Intake 4848.59 ml  Output 780 ml  Net 4068.59 ml   Filed Weights   10/25/21 0355 10/26/21 0500 10/27/21 0500  Weight: 80.3 kg 80 kg 80.3 kg    Examination: General: ill appearing male, no acute distress, intubated/sedated HEENT: ETT, MM pink/moist, PRRL on right, cataract on left Neuro: sedated, not waking or following commands CV: s1s2 regular rate and rhythm, no murmur, rubs, or gallops,  PULM:  course breath sounds, no wheezing or rhonchi GI: soft, bowel sounds active in all 4 quadrants,  non-distended, tolerating TF Extremities: warm/dry, diffuse 1-2+ edema upper and lower extremities Skin: no rashes or lesions  Resolved Hospital Problem list     Assessment & Plan:  Ongoing status epilepticus -No seizure seen on EEG since 11/3 History of EtOH abuse, prior SDH -LVO ruled out on CT.  UDS neg.  MRI benign.  Ongoing cEEG. P: Primary management per neurology  Maintain neuro protective measures; goal for eurothermia, euglycemia, eunatermia, normoxia, and PCO2 goal of 35-40 Nutrition and bowel regiment  Seizure precautions  AEDs per neurology  Aspirations precautions  EEG per neurology  Wean sedation, will try to wean off versed by end of day and then slowly wean propofol CSF culture negative to date Continue high dose thiamine  Empiric acyclovir discontinued as HSV pcr from CSF is negative.  Acute Hypoxic Respiratory Failure  RLL Pneumonia -in the setting of status  P: Continue ventilator support with lung protective strategies  Increasing PEEP and respiratory rate over last 24hours Repeat ABG this AM VAP bundle in place  PAD protocol Started ceftriaxone, azithromycin and linezolid 11/5 for pneumonia coverage. Awaiting sputum culture  AKI  Non-anion Gap Metabolic Acidosis -Baseline sr cr ~0.9 to 1  -Concern for Acyclovir as causative agent  -FENa 0.3% indicating pre-renal on  11/3 P: Follow renal function  Monitor urine output Trend Bmet Avoid nephrotoxins Ensure adequate renal perfusion  Acyclovir discontinued as HSV pcr is negative Renal consult placed Started on bicarb drip overnight, will likely discontinue today  Hypoglycemia P: Continue TF SSI q4  Complex social dynamics -May need help figuring out POA  Best Practice (right click and "Reselect all SmartList Selections" daily)  Diet/type: tubefeeds,  DVT prophylaxis: prophylactic heparin  GI prophylaxis: PPI Lines: N/A Foley:  dc foley Code Status:  full code Last date of multidisciplinary goals of care discussion - pending, complex family dynamics   Total critical care time: 35 minutes  Freda Jackson, MD [place] Office: [phone]   See Amion for personal pager PCCM on call pager [phone] until 7pm. Please call Elink 7p-7a. [phone]

## 2021-10-28 NOTE — Progress Notes (Signed)
[facility] Progress Note Patient Name: Jeffrey Mullen DOB: June 09, 1951 MRN: [id number]   Date of Service  10/28/2021  HPI/Events of Note  ABG on 100%/PRVC 24/TV 440/P 10 = 7.26/39/79/17.5  eICU Interventions  Plan: Wean FiO2 as tolerated. Otherwise, continue present ventilator management     Intervention Category Major Interventions: Respiratory failure - evaluation and management  Jovi Zavadil Eugene 10/28/2021, 5:33 AM

## 2021-10-28 NOTE — Progress Notes (Signed)
[facility] Progress Note Patient Name: Jeffrey Mullen DOB: 1951/06/30 MRN: [id number]   Date of Service  10/28/2021  HPI/Events of Note  Hyperkalemia - K+ = 5.7. Ca++ = 7.5 which corrects to 8.94 (Normal) given Albumin = 2.2.  eICU Interventions  Plan: Lokelma 10 gm per tube now. Repeat BMP at 12 noon.  No Ca++ replacement indicated.      Intervention Category Major Interventions: Electrolyte abnormality - evaluation and management  Vince Ainsley Eugene 10/28/2021, 5:18 AM

## 2021-10-28 NOTE — Consult Note (Addendum)
Renal Service Consult Note [facility]  Jeffrey Mullen 10/28/2021 Sol Blazing, MD Requesting Physician: Dr. Erin Fulling, Lenna Sciara.   Reason for Consult: Renal failure HPI: The patient is a 70 y.o. year-old w/ hx of ETOH abuse, anemia, congenital blindness, depression, cirrhosis, HTN and seizure d/o was found down at a grocery store on 10/31 w/ signs of possible seizure. In ED head CT was negative. Pt had a seisures in the CT suite. He required intubation in ED and was given IV ativan then Keppra load IV for status epilepticus. Also started on propofol for sedation. In the ICU pt rec'd high dose IV thiamine, further AED"s and sedation bundle per neurology.  He was rx'd empirically w/ IV acyclovir for 4 days from 11/1- 11/5, dc'd this am.  Creat on admit was 1.02 and was stable until 11/3 w/ creat 1.5, on 11/4 creat was 1.5 and today is up to 2.15.  Asked to see for renal failure.   Pt seen in ICU, pt is sedated on the ventilator.   UOP 1360 yesterday and 1200 the day before.  Total I/O = 26.2 L in and 8.3 L out = +17.8 L    Wt's show admit wt 79.2, last wt yest was 80.3kg   CTA head/ neck on 10/31 = 100 cc IV contrast   MRI brain on 11/01 w/ 8 cc Gadavist   BP on admit were high 200 /70    11/1 sbp 120- 150     11/2 sbp 78 - 109      11/3 sbp 90- 118       11/4  sbp 120- 140    Was on levo gtt low dose from 11/1- 11/4 dc'd    ROS - n/a   Past Medical History  Past Medical History:  Diagnosis Date   Alcohol abuse    Allergy    Anemia    Anxiety    Arthritis    Blind left eye    Cataract, right eye    Congenital blindness    LEFT EYE   Depression    Glaucoma, bilateral    Hepatic cirrhosis ([place])    Hepatitis    HEP C treated in 2016   HTN (hypertension)    Seizures ([place])    3 years ago   Past Surgical History  Past Surgical History:  Procedure Laterality Date   CATARACT EXTRACTION Left    GLAUCOMA SURGERY     Patient reports 65 or eleven surgeries for  Glaucoma   Family History  Family History  Problem Relation Age of Onset   Hearing loss Other    Kidney disease Neg Hx    Colon cancer Neg Hx    Esophageal cancer Neg Hx    Rectal cancer Neg Hx    Stomach cancer Neg Hx    Social History  reports that he has been smoking cigarettes. He has been smoking an average of .25 packs per day. He has never used smokeless tobacco. He reports current alcohol use of about 5.0 standard drinks per week. He reports that he does not use drugs. Allergies No Active Allergies Home medications Prior to Admission medications   Medication Sig Start Date End Date Taking? Authorizing Provider  amLODipine (NORVASC) 10 MG tablet Take 1 tablet (10 mg total) by mouth daily. 03/06/21   [name], NP  atorvastatin (LIPITOR) 40 MG tablet Take 1 tablet (40 mg total) by mouth daily. 01/24/21 01/24/22  Cherre Robins, MD  atropine 1 % ophthalmic solution Place 1 drop into the left eye 2 (two) times daily. 03/25/19   Elsie Stain, MD  bimatoprost (LUMIGAN) 0.01 % SOLN Place 1 drop into the right eye at bedtime.    [provider]  busPIRone (BUSPAR) 15 MG tablet TAKE ONE TABLET BY MOUTH THREE TIMES A DAY Patient taking differently: Take 15 mg by mouth 2 (two) times daily as needed (anxiety). 05/19/21   [name], NP  Cholecalciferol (VITAMIN D) 50 MCG (2000 UT) CAPS Take 2,000 Units by mouth daily.    [provider]  dorzolamide-timolol (COSOPT) 22.3-6.8 MG/ML ophthalmic solution Place 1 drop into the right eye 2 (two) times daily.    [provider]  Glucosamine-Chondroitin (GLUCOSAMINE CHONDR COMPLEX PO) Take 1 tablet by mouth daily.    [provider]  ibuprofen (ADVIL) 800 MG tablet TAKE ONE TABLET BY MOUTH EVERY 8 HOURS AS NEEDED Patient taking differently: Take 800 mg by mouth every 8 (eight) hours as needed for fever or mild pain. 10/09/21   [name], NP  lisinopril-hydrochlorothiazide (ZESTORETIC) 20-25 MG  tablet Take 1 tablet by mouth daily. 03/06/21   [name], NP  loratadine (CLARITIN) 10 MG tablet Take 1 tablet (10 mg total) by mouth daily. Patient taking differently: Take 10 mg by mouth daily as needed for allergies. 05/12/21 12/30/21  [name], NP  Multiple Vitamins-Minerals (VISION FORMULA/LUTEIN PO) Take 1 tablet by mouth daily.    [provider]  mupirocin ointment (BACTROBAN) 2 % Apply 1 application topically 2 (two) times daily as needed (affected area(s)). 11/23/20   [provider]  sildenafil (VIAGRA) 100 MG tablet Take 0.5-1 tablets (50-100 mg total) by mouth daily as needed for erectile dysfunction. 06/20/20 12/30/21  [name], NP  Soap & Cleansers [facility] Phineas Inches) Hal Neer Apply to affected area Patient taking differently: Apply 1 Bar topically daily as needed (affected area). 05/12/21   [name], NP  tacrolimus (PROTOPIC) 0.1 % ointment APPLY TO FACE NIGHTLY Patient taking differently: Apply 1 application topically at bedtime. 10/09/21   Lavonna Monarch, MD  triamcinolone (KENALOG) 0.025 % ointment APPLY ONE APPLICATION TOPICALLY TWICE A DAY Patient taking differently: Apply 1 application topically 2 (two) times daily as needed (rash). 09/29/20   [name], NP  vitamin C (ASCORBIC ACID) 500 MG tablet Take 500 mg by mouth daily.    [provider]     Vitals:   10/28/21 1100 10/28/21 1200 10/28/21 1300 10/28/21 1332  BP: (!) 141/55 (!) 143/53 (!) 161/51   Pulse: 63 70 74   Resp: (!) 25 (!) 25 (!) 26   Temp:  99.1 F (37.3 C)    TempSrc:  Oral    SpO2: 94% 93% (!) 89% 92%  Weight:      Height:       Exam Gen on vent, sedated, OG tube w/ TF's running No rash, cyanosis or gangrene Sclera anicteric, throat w/ ETT No jvd or bruits Chest clear anterior/ lateral RRR no MRG Abd soft ntnd no mass or ascites +bs GU normal MS no joint effusions or deformity Ext diffuse 2+ bilat UE edema and 2-3+ bilat LE edema + scrotal  edema Neuro is on vent, sedated       Home meds include - norvasc, asa, lipitor, buspar, advil, lisinopril-hctz, vit/ supps/ prn's/ eye gtts  UOP 1360 yesterday and 1200 the day before.  Total I/O = 26.2 L in and 8.3  L out = +17.8 L    Wt's show admit wt 79.2, last wt yest was 80.3kg   CTA head/ neck on 10/31 = 100 cc IV contrast   MRI brain on 11/01 w/ 8 cc Gadavist    CXR 11/5 - small bilat effusions, stable moderate bibasilar atelectasis    Renal US 11/3 > 10- 12 cm kidneys w/o hydro or ^echotexture        UA 10/31 - negative    UA 11/3 - negative    UA 11/4- negative    11/5 - UNa < 10, UCr 193.3     Na 137  K 5.4  CO2 18  BUN 29  Creat 2.39  eGFR 29  alb 2.0      AST 51/ ALT 29  Tbili 0.2  WBC 13K  Hb 8.5      Hypotension on pressor support 11/1 - 11/4 dc'd    IV contrast on 10/31 100cc   Assessment/ Plan: Acute renal failure - admit creat was 1.0 on 10/31, now rising up to 1.5 yest and 2.1 today. UOP is good. AKI likely ATN due to IV contrast injury + hypotension/ shock on pressor for 3-4 days, less likely acyclovir renal injury. BP's better and off pressors now. UOP remains good. UA's all normal, renal US w/o obstruction.  Quite vol overloaded on exam, +17 L since admission. Will give IV lasix and dc IVF"s. Otherwise recommend supportive care at this time, no indication for RRT. Will follow.  Status epilepticus - on AED regimen per neurology, weaning off of sedating gtts PNA - getting IV rocephin / zithromax Vol overload - see above H/o HTN - no need for BP lowering meds yet      Kelly Splinter  MD 10/28/2021, 2:17 PM  Recent Labs  Lab 10/27/21 0811 10/27/21 1639 10/28/21 0916 10/28/21 1017  WBC 7.3  --  13.7*  --   HGB 10.8*   < > 8.8* 8.5*   < > = values in this interval not displayed.   Recent Labs  Lab 10/26/21 1646 10/27/21 0502 10/27/21 1639 10/28/21 0304 10/28/21 0347 10/28/21 0916 10/28/21 1017  K  --  3.3*   < > 5.7*  5.6*   < > 5.4* 5.0  BUN   --  11  --  25*  25*  --  29*  --   CREATININE  --  1.59*  --  2.26*  2.15*  --  2.39*  --   CALCIUM  --  7.7*  --  7.5*  7.4*  --  7.5*  --   PHOS 4.1 3.1  --   --   --   --   --    < > = values in this interval not displayed.

## 2021-10-28 NOTE — Procedures (Addendum)
MRN: [id number]  Epilepsy Attending: Lora Havens  Referring Physician/Provider: Dr Rosalin Hawking Duration: 10/27/2021 1846 to 10/28/2021 1846   Patient history: 70 y.o. male with PMH of HTN, PVD, congenital glaucoma, cataract, smoker, left frontal and occipital traumatic contusion with small left frontal SDH in 2019 presented to ED for right gaze and left sided weakness with questionable seizure activity. EEG to evaluate for seizure   Level of alertness: comatose   AEDs during EEG study: LEV,LCM, Phenobarb, Propofol, Versed   Technical aspects: This EEG study was done with scalp electrodes positioned according to the 10-20 International system of electrode placement. Electrical activity was acquired at a sampling rate of 500Hz  and reviewed with a high frequency filter of 70Hz  and a low frequency filter of 1Hz . EEG data were recorded continuously and digitally stored.    Description: EEG showed burst suppression with generalized bursts of 5-6hz  polymorphic theta slowing lasting 1-3 seconds as well as eeg suppression lasting about 10 seconds. Hyperventilation and photic stimulation were not performed.      ABNORMALITY -Burst suppression, generalized   IMPRESSION: This study is suggestive of profound diffuse encephalopathy, nonspecific etiology but likely related to sedation. No seizures were seen during this study.    Skyeler Scalese [name]

## 2021-10-28 NOTE — Progress Notes (Signed)
Pt had a brief period of desaturation in mid 80s even with 100% increase and peep increase to 12. No mucous plugging noted or peak pressuring on the ventilator. The ventilator looked appropriate with good readings and no leaks. RT took pt off of ventilator for approximately 3 minutes to bag up the saturations. Pt responded well to being bagged. Pt was placed back on ventilator and peep was weaned back down to 10. Will try to titrate the Fi02 back down as tolerated. Rt will monitor.

## 2021-10-28 NOTE — Progress Notes (Signed)
LTM maintance done. No Skin breakdown noted. Results pending. EEG leads impedence = good.

## 2021-10-28 NOTE — Progress Notes (Signed)
[facility] Progress Note Patient Name: Jeffrey Mullen DOB: 1951-05-26 MRN: [id number]   Date of Service  10/28/2021  HPI/Events of Note  ABG on 100%/ PRVC 24/TV 440/P 8 = 7.225/40/64/16.4. Rate increase beyond 26 limited by air trapping and ventilator asynchrony.  eICU Interventions  Plan: Increase PRVC rate to 26 and PEEP to 10 NaHCO3 IV infusion at 75 mL/hour.  Repeat ABG in 3 hours.      Intervention Category Major Interventions: Acid-Base disturbance - evaluation and management;Respiratory failure - evaluation and management  Jeffrey Mullen 10/28/2021, 12:20 AM

## 2021-10-28 NOTE — Progress Notes (Signed)
Neurology Progress Note   S:// Seen and examined.   cEEG with burst suppression pattern, no seizures Spiked fever overnight, CXR c/f PNA, started on empiric coverage with ceftriaxone and azithromycin HSV PCR neg - d/c Acyclovir   O:// Current vital signs: BP (!) 141/55   Pulse 63   Temp 98.3 F (36.8 C) (Oral)   Resp (!) 25   Ht _0  (1.778 m)   Wt 80.3 kg   SpO2 94%   BMI 25.40 kg/m  Vital signs in last 24 hours: Temp:  [97.5 F (36.4 C)-100.1 F (37.8 C)] 98.3 F (36.8 C) (11/05 0800) Pulse Rate:  [59-84] 63 (11/05 1100) Resp:  [18-27] 25 (11/05 1100) BP: (103-141)/(42-55) 141/55 (11/05 1100) SpO2:  [87 %-97 %] 94 % (11/05 1100) FiO2 (%):  [50 %-100 %] 50 % (11/05 1021) General: Sedated on propofol and Versed drips, intubated HEENT: Normocephalic/atraumatic CVs: Regular rate rhythm Respiratory: Vented Abdomen nondistended nontender Extremities warm well perfused Neurological exam Sedated intubated-on propofol and Versed drips Remains comatose Right pupil sluggishly reactive Breathing with the ventilator No response to noxious stimulation   Medications  Current Facility-Administered Medications:    0.9 %  sodium chloride infusion, 250 mL, Intravenous, Continuous, Alva, Rakesh V, MD   0.9 %  sodium chloride infusion, , Intravenous, PRN, Candee Furbish, MD, Paused at 10/28/21 1032   acetaminophen (TYLENOL) 160 MG/5ML solution 650 mg, 650 mg, Oral, Q4H PRN, Freddi Starr, MD   azithromycin (ZITHROMAX) 500 mg in sodium chloride 0.9 % 250 mL IVPB, 500 mg, Intravenous, Q24H, Freda Jackson B, MD, Last Rate: 250 mL/hr at 10/28/21 1112, 500 mg at 10/28/21 1112   cefTRIAXone (ROCEPHIN) 2 g in sodium chloride 0.9 % 100 mL IVPB, 2 g, Intravenous, Q24H, Freddi Starr, MD, Paused at 10/28/21 0959   chlorhexidine gluconate (MEDLINE KIT) (PERIDEX) 0.12 % solution 15 mL, 15 mL, Mouth Rinse, BID, Kara Mead V, MD, 15 mL at 10/28/21 0810   Chlorhexidine  Gluconate Cloth 2 % PADS 6 each, 6 each, Topical, Daily, Rigoberto Noel, MD, 6 each at 10/28/21 0924   docusate (COLACE) 50 MG/5ML liquid 100 mg, 100 mg, Per Tube, BID PRN, Candee Furbish, MD, 100 mg at 10/28/21 0945   feeding supplement (PROSource TF) liquid 90 mL, 90 mL, Per Tube, BID, Freda Jackson B, MD, 90 mL at 10/28/21 2094   feeding supplement (VITAL 1.5 CAL) liquid 1,000 mL, 1,000 mL, Per Tube, Continuous, Freddi Starr, MD, Last Rate: 50 mL/hr at 10/27/21 1547, 1,000 mL at 70/96/28 3662   folic acid injection 1 mg, 1 mg, Intravenous, Daily, Candee Furbish, MD, 1 mg at 10/28/21 0940   heparin injection 5,000 Units, 5,000 Units, Subcutaneous, Q8H, Dewald, Jonathan B, MD   insulin aspart (novoLOG) injection 0-15 Units, 0-15 Units, Subcutaneous, Q4H, Candee Furbish, MD, 5 Units at 10/28/21 0816   insulin aspart (novoLOG) injection 4 Units, 4 Units, Subcutaneous, Q4H, Freddi Starr, MD, 4 Units at 10/28/21 0817   insulin glargine-yfgn (SEMGLEE) injection 8 Units, 8 Units, Subcutaneous, Daily, Dewald, Cheryle Horsfall, MD   ipratropium-albuterol (DUONEB) 0.5-2.5 (3) MG/3ML nebulizer solution 3 mL, 3 mL, Nebulization, Q6H, Harris, Whitney D, NP, 3 mL at 10/28/21 0730   lacosamide (VIMPAT) 200 mg in sodium chloride 0.9 % 25 mL IVPB, 200 mg, Intravenous, Q12H, Greta Doom, MD, Last Rate: 90 mL/hr at 10/28/21 1100, Infusion Verify at 10/28/21 1100   levETIRAcetam (KEPPRA) IVPB 1500 mg/ 100 mL premix, 1,500  mg, Intravenous, Q12H, Rosalin Hawking, MD, Stopped at 10/28/21 1884   linezolid (ZYVOX) IVPB 600 mg, 600 mg, Intravenous, Q12H, Freddi Starr, MD, Last Rate: 300 mL/hr at 10/28/21 1100, Infusion Verify at 10/28/21 1100   MEDLINE mouth rinse, 15 mL, Mouth Rinse, 10 times per day, Rigoberto Noel, MD, 15 mL at 10/28/21 1113   midazolam PF (VERSED) injection 15 mg, 15 mg, Intravenous, Q5 min PRN **AND** midazolam (VERSED) bolus via infusion 15 mg, 15 mg, Intravenous, Q5 min PRN, 15  mg at 10/23/21 1902 **AND** midazolam (VERSED) 100 mg/100 mL (1 mg/mL) premix infusion, 5 mg/hr, Intravenous, Continuous, Amie Portland, MD, Stopped at 10/28/21 1025   multivitamin with minerals tablet 1 tablet, 1 tablet, Per Tube, Daily, Freddi Starr, MD, 1 tablet at 10/28/21 1660   norepinephrine (LEVOPHED) 79m in 2574mpremix infusion, 2-10 mcg/min, Intravenous, Titrated, SmCandee FurbishMD, Stopped at 10/28/21 1033   pantoprazole (PROTONIX) injection 40 mg, 40 mg, Intravenous, Q24H, XuRosalin HawkingMD, 40 mg at 10/28/21 096301 PHENObarbital (LUMINAL) injection 65 mg, 65 mg, Intravenous, BID, ArAmie PortlandMD, 65 mg at 10/28/21 096010 polyethylene glycol (MIRALAX / GLYCOLAX) packet 17 g, 17 g, Per Tube, Daily PRN, SmCandee FurbishMD, 17 g at 10/27/21 1547   propofol (DIPRIVAN) 1000 MG/100ML infusion, 5-80 mcg/kg/min, Intravenous, Continuous, Horton, Kristie M, DO, Stopped at 10/28/21 1033   sodium bicarbonate 150 mEq in sterile water 1,150 mL infusion, , Intravenous, Continuous, SoAnders SimmondsMD, Last Rate: 75 mL/hr at 10/28/21 1100, Infusion Verify at 10/28/21 1100   [COMPLETED] thiamine 50093mn normal saline (29m71mVPB, 500 mg, Intravenous, Q8H, Stopped at 10/26/21 0552 **FOLLOWED BY** thiamine (B-1) 250 mg in sodium chloride 0.9 % 50 mL IVPB, 250 mg, Intravenous, Daily, Stopped at 10/28/21 1031 **FOLLOWED BY** [START ON 11/01/2021] thiamine (B-1) injection 100 mg, 100 mg, Intravenous, Daily, ArorAmie [place] [facility] CBC    Component Value Date/Time   WBC 13.7 (H) 10/28/2021 0916   RBC 3.24 (L) 10/28/2021 0916   HGB 8.5 (L) 10/28/2021 1017   HGB 12.9 (L) 05/12/2021 0921   HCT 25.0 (L) 10/28/2021 1017   HCT 38.7 05/12/2021 0921   PLT 142 (L) 10/28/2021 0916   PLT 295 05/12/2021 0921   MCV 86.4 10/28/2021 0916   MCV 86 05/12/2021 0921   MCH 27.2 10/28/2021 0916   MCHC 31.4 10/28/2021 0916   RDW 16.9 (H) 10/28/2021 0916   RDW 15.8 (H) 05/12/2021 0921   LYMPHSABS 0.7 10/27/2021  0811   MONOABS 0.4 10/27/2021 0811   EOSABS 0.0 10/27/2021 0811   BASOSABS 0.1 10/27/2021 0811    CMP     Component Value Date/Time   NA 138 10/28/2021 1017   NA 139 05/12/2021 0921   K 5.0 10/28/2021 1017   CL 109 10/28/2021 0916   CO2 18 (L) 10/28/2021 0916   GLUCOSE 231 (H) 10/28/2021 0916   BUN 29 (H) 10/28/2021 0916   BUN 10 05/12/2021 0921   CREATININE 2.39 (H) 10/28/2021 0916   CREATININE 1.01 12/09/2014 0926   CALCIUM 7.5 (L) 10/28/2021 0916   PROT 5.3 (L) 10/28/2021 0916   PROT 7.8 05/12/2021 0921   ALBUMIN 2.0 (L) 10/28/2021 0916   ALBUMIN 4.9 (H) 05/12/2021 0921   AST 51 (H) 10/28/2021 0916   ALT 29 10/28/2021 0916   ALKPHOS 72 10/28/2021 0916   BILITOT 0.2 (L) 10/28/2021 0916   BILITOT 0.2 05/12/2021 0921   GFRNONAA 29 (L) 10/28/2021 09169323  GFRAA 80 12/07/2020 1529   UDS negative on arrival EtOH undetectable on arrival  CSF studies with glucose 66, RBC 450, WBC 3, protein 152   EEG this admission  Spot EEG 10/31: -Background suppression, generalized - Intermittent slow, generalized  LTM 10/23/2021 1806 to 10/24/2021 1646 This study showed two seizures without clinical signs arising from right anterior temporal region on 10/24/2021 at 0726 and 0813, lasting about 1 minute each.  Additionally there are lateralized periodic discharges with overriding fast activity which is on the ictal-interictal continuum with high potential for seizure recurrence. There is also profound diffuse encephalopathy, nonspecific etiology but likely related to sedation.   LTM 10/24/2021 1846 to 10/25/2021 1846 This study showed multiple seizures without clinical signs arising from right anterior temporal region as described above, lasting about 30 seconds to 1 minute. Last seizure was on 10/25/2021 at 0933. Additionally there are lateralized periodic discharges with overriding fast activity which is on the ictal-interictal continuum with high potential for seizure recurrence. There is  also profound diffuse encephalopathy, nonspecific etiology but likely related to sedation.   LTM 10/25/2021 1846 to 10/26/2021 1846 Generalized burst suppression. No seizures were seen during this study. Patient event button was pressed on 10/26/2021 at 1741. Per RN, patient was shivering while placing catheter. Concomitant eeg did not show any eeg change to suggest seizure. This was most likely not an epileptic event. EEG appears to be improving compared to previous day.  LTM 10/26/2021 1846 to 10/27/2021 0800 Generalized burst suppression. No seizures were seen during this study.    LTM 10/27/2021 - 10/28/2021 IMPRESSION: This study is suggestive of profound diffuse encephalopathy, nonspecific etiology but likely related to sedation. No seizures were seen during this study.  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  CT perfusion with no acute infarction or perfusion deficit.  MR brain w+w/o - NAD  Assessment: 70 year old man with history of hypertension, peripheral vascular disease, congenital truck,, cataract, tobacco abuse, left frontal and occipital traumatic contusion with small left frontal SDH in 2019, history of alcohol abuse who presented as a code stroke for right gaze and left-sided weakness with questionable seizure activity reported by bystander with blood pressures elevated on scene  and in the ER with repeat generalized tonic-clonic seizure with eye deviation to the left x2 in the emergency room for which she received Ativan and Keppra load and after the second episode also loaded with fosphenytoin. He has been in burst suppression x48 hrs with no seizures during that time. Today will continue sedation wean though slowly given new PNA. On ceftriaxone and azithromycin. HSV CSF PCR pending, remains on acyclovir. MRI brain unremarkable.  Impression: -Seizures (has been evaluated by EEG in 2019 for possible syncope but no documented h/o seizures in the past), status epilepticus with ensuing Todd's paralysis - Currently in burst suppression on EEG, no sz past 48 hrs -PLEDs initially-resolved, CSF has increased protein - ?HSV encephalitis -Evaluate for stroke due to presenting with stroke like  symptoms -Hypertensive emergency  Recommendations: Continue current AED regimen - vimpat 267m bid, keppra 15047mbid, phenobarb 6572mid EEG with burst suppression -continues to be both suppressed.  I would stop his Versed now he is running at 2 mg/h. He is on 20 of propofol-I would decrease it to 10 and then discontinue it in the next couple of hours. Continue LTM until off sedation without recurrent seizures, after that can follow clinical exam HSV PCR negative-discontinue acyclovir. Continue ceftriaxone and azithromycin for empiric coverage of PNA, avoid abx that can decrease seizure threshold Metabolic derangement management per primary team as you are Seizure precautions  D/w Dr. DewErin FullingRITICAL CARE ATTESTATION Performed by: AshAmie PortlandD Total critical care time: 31 minutes Critical care time was exclusive of separately billable procedures and treating other patients and/or supervising APPs/Residents/Students Critical care was necessary to treat or prevent imminent or life-threatening deterioration due to left This patient is critically ill and at significant risk for  neurological worsening and/or death and care requires constant monitoring. Critical care was time spent personally by me on the following activities: development of treatment plan with patient and/or surrogate as well as nursing, discussions with consultants, evaluation of patient's response to treatment, examination of patient, obtaining history from patient or surrogate, ordering and performing treatments and interventions, ordering and review of laboratory studies, ordering and review of radiographic studies, pulse oximetry, re-evaluation of patient's condition, participation in multidisciplinary rounds and medical decision making of high complexity in the care of this patient.

## 2021-10-29 ENCOUNTER — Inpatient Hospital Stay (HOSPITAL_COMMUNITY): Payer: Medicare Other

## 2021-10-29 DIAGNOSIS — R569 Unspecified convulsions: Secondary | ICD-10-CM | POA: Diagnosis not present

## 2021-10-29 DIAGNOSIS — G40901 Epilepsy, unspecified, not intractable, with status epilepticus: Secondary | ICD-10-CM | POA: Diagnosis not present

## 2021-10-29 LAB — BASIC METABOLIC PANEL
Anion gap: 10 (ref 5–15)
BUN: 43 mg/dL — ABNORMAL HIGH (ref 8–23)
CO2: 17 mmol/L — ABNORMAL LOW (ref 22–32)
Calcium: 7.5 mg/dL — ABNORMAL LOW (ref 8.9–10.3)
Chloride: 114 mmol/L — ABNORMAL HIGH (ref 98–111)
Creatinine, Ser: 2.61 mg/dL — ABNORMAL HIGH (ref 0.61–1.24)
GFR, Estimated: 26 mL/min — ABNORMAL LOW (ref >60)
Glucose, Bld: 139 mg/dL — ABNORMAL HIGH (ref 70–99)
Potassium: 4.9 mmol/L (ref 3.5–5.1)
Sodium: 141 mmol/L (ref 135–145)

## 2021-10-29 LAB — RENAL FUNCTION PANEL
Albumin: 2.2 g/dL — ABNORMAL LOW (ref 3.5–5.0)
Anion gap: 10 (ref 5–15)
BUN: 43 mg/dL — ABNORMAL HIGH (ref 8–23)
CO2: 17 mmol/L — ABNORMAL LOW (ref 22–32)
Calcium: 8.1 mg/dL — ABNORMAL LOW (ref 8.9–10.3)
Chloride: 113 mmol/L — ABNORMAL HIGH (ref 98–111)
Creatinine, Ser: 2.91 mg/dL — ABNORMAL HIGH (ref 0.61–1.24)
GFR, Estimated: 23 mL/min — ABNORMAL LOW (ref >60)
Glucose, Bld: 133 mg/dL — ABNORMAL HIGH (ref 70–99)
Phosphorus: 4.9 mg/dL — ABNORMAL HIGH (ref 2.5–4.6)
Potassium: 6.3 mmol/L (ref 3.5–5.1)
Sodium: 140 mmol/L (ref 135–145)

## 2021-10-29 LAB — CSF CULTURE W GRAM STAIN: Culture: NO GROWTH

## 2021-10-29 LAB — GLUCOSE, CAPILLARY
Glucose-Capillary: 126 mg/dL — ABNORMAL HIGH (ref 70–99)
Glucose-Capillary: 134 mg/dL — ABNORMAL HIGH (ref 70–99)
Glucose-Capillary: 136 mg/dL — ABNORMAL HIGH (ref 70–99)
Glucose-Capillary: 136 mg/dL — ABNORMAL HIGH (ref 70–99)
Glucose-Capillary: 149 mg/dL — ABNORMAL HIGH (ref 70–99)
Glucose-Capillary: 183 mg/dL — ABNORMAL HIGH (ref 70–99)

## 2021-10-29 LAB — CULTURE, RESPIRATORY W GRAM STAIN

## 2021-10-29 LAB — TRIGLYCERIDES: Triglycerides: 197 mg/dL — ABNORMAL HIGH (ref <150)

## 2021-10-29 LAB — POCT I-STAT 7, (LYTES, BLD GAS, ICA,H+H)
Acid-base deficit: 6 mmol/L — ABNORMAL HIGH (ref 0.0–2.0)
Bicarbonate: 20.3 mmol/L (ref 20.0–28.0)
Calcium, Ion: 1.13 mmol/L — ABNORMAL LOW (ref 1.15–1.40)
HCT: 26 % — ABNORMAL LOW (ref 39.0–52.0)
Hemoglobin: 8.8 g/dL — ABNORMAL LOW (ref 13.0–17.0)
O2 Saturation: 98 %
Patient temperature: 100.5
Potassium: 5 mmol/L (ref 3.5–5.1)
Sodium: 140 mmol/L (ref 135–145)
TCO2: 22 mmol/L (ref 22–32)
pCO2 arterial: 46.5 mmHg (ref 32.0–48.0)
pH, Arterial: 7.252 — ABNORMAL LOW (ref 7.350–7.450)
pO2, Arterial: 133 mmHg — ABNORMAL HIGH (ref 83.0–108.0)

## 2021-10-29 LAB — CK: Total CK: 8949 U/L — ABNORMAL HIGH (ref 49–397)

## 2021-10-29 MED ORDER — FENTANYL CITRATE PF 50 MCG/ML IJ SOSY
25.0000 ug | PREFILLED_SYRINGE | INTRAMUSCULAR | Status: AC | PRN
  Administered 2021-10-30 – 2021-11-01 (×3): 25 ug via INTRAVENOUS
  Filled 2021-10-29 (×3): qty 1

## 2021-10-29 MED ORDER — FENTANYL CITRATE PF 50 MCG/ML IJ SOSY
25.0000 ug | PREFILLED_SYRINGE | INTRAMUSCULAR | Status: DC | PRN
  Administered 2021-10-31: 50 ug via INTRAVENOUS
  Filled 2021-10-29: qty 1

## 2021-10-29 MED ORDER — INSULIN ASPART 100 UNIT/ML IV SOLN
5.0000 [IU] | Freq: Once | INTRAVENOUS | Status: AC
  Administered 2021-10-29: 5 [IU] via INTRAVENOUS

## 2021-10-29 MED ORDER — CALCIUM GLUCONATE-NACL 1-0.675 GM/50ML-% IV SOLN
1.0000 g | Freq: Once | INTRAVENOUS | Status: AC
  Administered 2021-10-29: 1000 mg via INTRAVENOUS
  Filled 2021-10-29: qty 50

## 2021-10-29 MED ORDER — PHENTOLAMINE MESYLATE 5 MG IJ SOLR: 5.0000 mg | Freq: Once | INTRAMUSCULAR | Status: DC

## 2021-10-29 MED ORDER — NITROGLYCERIN 2 % TD OINT: 1.0000 [in_us] | TOPICAL_OINTMENT | Freq: Three times a day (TID) | TRANSDERMAL | Status: DC

## 2021-10-29 MED ORDER — SODIUM ZIRCONIUM CYCLOSILICATE 10 G PO PACK
10.0000 g | PACK | Freq: Three times a day (TID) | ORAL | Status: DC
  Filled 2021-10-29: qty 1

## 2021-10-29 MED ORDER — SODIUM ZIRCONIUM CYCLOSILICATE 10 G PO PACK: 10.0000 g | PACK | Freq: Once | ORAL | Status: DC

## 2021-10-29 MED ORDER — LEVETIRACETAM IN NACL 500 MG/100ML IV SOLN
500.0000 mg | Freq: Two times a day (BID) | INTRAVENOUS | Status: DC
  Administered 2021-10-29 – 2021-11-08 (×22): 500 mg via INTRAVENOUS
  Filled 2021-10-29 (×23): qty 100

## 2021-10-29 MED ORDER — SODIUM ZIRCONIUM CYCLOSILICATE 10 G PO PACK
10.0000 g | PACK | Freq: Three times a day (TID) | ORAL | Status: AC
  Administered 2021-10-29 (×3): 10 g
  Filled 2021-10-29 (×3): qty 1

## 2021-10-29 MED ORDER — DEXTROSE 50 % IV SOLN
1.0000 | Freq: Once | INTRAVENOUS | Status: AC
  Administered 2021-10-29: 50 mL via INTRAVENOUS
  Filled 2021-10-29: qty 50

## 2021-10-29 MED ORDER — NEPRO/CARBSTEADY PO LIQD
1000.0000 mL | ORAL | Status: DC
  Administered 2021-10-29 – 2021-10-31 (×3): 1000 mL
  Filled 2021-10-29 (×4): qty 1000

## 2021-10-29 NOTE — Progress Notes (Signed)
Neurology Progress Note   S:// Seen and examined.   cEEG with burst suppression. Started back on propofol overnight for vent dyssynchrony   O:// Current vital signs: BP (!) 156/62 (BP Location: Right Arm)   Pulse 64   Temp (!) 100.5 F (38.1 C) (Axillary)   Resp (!) 33   Ht 5' 10" (1.778 m)   Wt 80.3 kg   SpO2 90%   BMI 25.40 kg/m  Vital signs in last 24 hours: Temp:  [99.1 F (37.3 C)-100.5 F (38.1 C)] 100.5 F (38.1 C) (11/06 0800) Pulse Rate:  [61-86] 64 (11/06 0800) Resp:  [25-33] 33 (11/06 0800) BP: (128-171)/(43-69) 156/62 (11/06 0800) SpO2:  [85 %-96 %] 90 % (11/06 0800) FiO2 (%):  [50 %-100 %] 70 % (11/06 0800) General: intubated, no sedation HEENT: Normocephalic/atraumatic CVs: Regular rate rhythm Respiratory: Vented Abdomen nondistended nontender Extremities warm well perfused Neurological exam Sedated intubated-on propofol and Versed drips Remains comatose Right pupil sluggishly reactive, left corneal  opacity Breathing above vent No response to noxious stimulation   Medications  Current Facility-Administered Medications:    0.9 %  sodium chloride infusion, 250 mL, Intravenous, Continuous, Alva, Rakesh V, MD   0.9 %  sodium chloride infusion, , Intravenous, PRN, Candee Furbish, MD, Stopped at 10/28/21 1957   acetaminophen (TYLENOL) 160 MG/5ML solution 650 mg, 650 mg, Oral, Q4H PRN, Freddi Starr, MD   azithromycin (ZITHROMAX) 500 mg in sodium chloride 0.9 % 250 mL IVPB, 500 mg, Intravenous, Q24H, Freddi Starr, MD, Stopped at 10/28/21 1215   cefTRIAXone (ROCEPHIN) 2 g in sodium chloride 0.9 % 100 mL IVPB, 2 g, Intravenous, Q24H, Freddi Starr, MD, Paused at 10/28/21 0959   chlorhexidine gluconate (MEDLINE KIT) (PERIDEX) 0.12 % solution 15 mL, 15 mL, Mouth Rinse, BID, Kara Mead V, MD, 15 mL at 10/28/21 2000   Chlorhexidine Gluconate Cloth 2 % PADS 6 each, 6 each, Topical, Daily, Rigoberto Noel, MD, 6 each at 10/28/21 0924   docusate  (COLACE) 50 MG/5ML liquid 100 mg, 100 mg, Per Tube, BID PRN, Candee Furbish, MD, 100 mg at 10/28/21 0945   feeding supplement (NEPRO CARB STEADY) liquid 1,000 mL, 1,000 mL, Per Tube, Continuous, Roney Jaffe, MD, Last Rate: 50 mL/hr at 10/29/21 0619, 1,000 mL at 10/29/21 0619   feeding supplement (PROSource TF) liquid 90 mL, 90 mL, Per Tube, BID, Freda Jackson B, MD, 90 mL at 10/28/21 2122   fentaNYL (SUBLIMAZE) injection 25 mcg, 25 mcg, Intravenous, Q15 min PRN, Freddi Starr, MD   fentaNYL (SUBLIMAZE) injection 25-100 mcg, 25-100 mcg, Intravenous, Q30 min PRN, Freddi Starr, MD   folic acid injection 1 mg, 1 mg, Intravenous, Daily, Candee Furbish, MD, 1 mg at 10/29/21 0957   furosemide (LASIX) injection 80 mg, 80 mg, Intravenous, Q12H, Roney Jaffe, MD, 80 mg at 10/29/21 0210   heparin injection 5,000 Units, 5,000 Units, Subcutaneous, Q8H, Freddi Starr, MD, 5,000 Units at 10/29/21 0556   insulin aspart (novoLOG) injection 0-15 Units, 0-15 Units, Subcutaneous, Q4H, Candee Furbish, MD, 2 Units at 10/29/21 0859   insulin aspart (novoLOG) injection 4 Units, 4 Units, Subcutaneous, Q4H, Freddi Starr, MD, 4 Units at 10/29/21 0859   insulin glargine-yfgn (SEMGLEE) injection 8 Units, 8 Units, Subcutaneous, Daily, Freda Jackson B, MD, 8 Units at 10/28/21 1150   ipratropium-albuterol (DUONEB) 0.5-2.5 (3) MG/3ML nebulizer solution 3 mL, 3 mL, Nebulization, Q6H, Harris, Whitney D, NP, 3 mL at 10/29/21 [phone]  lacosamide (VIMPAT) 200 mg in sodium chloride 0.9 % 25 mL IVPB, 200 mg, Intravenous, Q12H, Greta Doom, MD, Last Rate: 90 mL/hr at 10/28/21 2239, 200 mg at 10/28/21 2239   levETIRAcetam (KEPPRA) IVPB 500 mg/100 mL premix, 500 mg, Intravenous, Q12H, Freddi Starr, MD, Last Rate: 400 mL/hr at 10/29/21 0955, 500 mg at 10/29/21 0955   MEDLINE mouth rinse, 15 mL, Mouth Rinse, 10 times per day, Rigoberto Noel, MD, 15 mL at 10/29/21 0557   midazolam PF (VERSED)  injection 15 mg, 15 mg, Intravenous, Q5 min PRN **AND** midazolam (VERSED) bolus via infusion 15 mg, 15 mg, Intravenous, Q5 min PRN, 15 mg at 10/23/21 1902 **AND** [DISCONTINUED] midazolam (VERSED) 100 mg/100 mL (1 mg/mL) premix infusion, 5 mg/hr, Intravenous, Continuous, Amie Portland, MD, Stopped at 10/28/21 1025   multivitamin with minerals tablet 1 tablet, 1 tablet, Per Tube, Daily, Freddi Starr, MD, 1 tablet at 10/28/21 2956   pantoprazole (PROTONIX) injection 40 mg, 40 mg, Intravenous, Q24H, Rosalin Hawking, MD, 40 mg at 10/28/21 2130   PHENObarbital (LUMINAL) injection 65 mg, 65 mg, Intravenous, BID, Amie Portland, MD, 65 mg at 10/29/21 0950   polyethylene glycol (MIRALAX / GLYCOLAX) packet 17 g, 17 g, Per Tube, Daily PRN, Candee Furbish, MD, 17 g at 10/27/21 1547   propofol (DIPRIVAN) 1000 MG/100ML infusion, 5-80 mcg/kg/min, Intravenous, Continuous, Horton, Kristie M, DO, Stopped at 10/29/21 0840   sodium zirconium cyclosilicate (LOKELMA) packet 10 g, 10 g, Per Tube, TID, Freddi Starr, MD, 10 g at 10/29/21 0617   [COMPLETED] thiamine 548m in normal saline (549m IVPB, 500 mg, Intravenous, Q8H, Stopped at 10/26/21 0552 **FOLLOWED BY** thiamine (B-1) 250 mg in sodium chloride 0.9 % 50 mL IVPB, 250 mg, Intravenous, Daily, Stopped at 10/28/21 1031 **FOLLOWED BY** [START ON 11/01/2021] thiamine (B-1) injection 100 mg, 100 mg, Intravenous, Daily, ArAmie PortlandMD Labs CBC    Component Value Date/Time   WBC 13.7 (H) 10/28/2021 0916   RBC 3.24 (L) 10/28/2021 0916   HGB 8.8 (L) 10/29/2021 0848   HGB 12.9 (L) 05/12/2021 0921   HCT 26.0 (L) 10/29/2021 0848   HCT 38.7 05/12/2021 0921   PLT 142 (L) 10/28/2021 0916   PLT 295 05/12/2021 0921   MCV 86.4 10/28/2021 0916   MCV 86 05/12/2021 0921   MCH 27.2 10/28/2021 0916   MCHC 31.4 10/28/2021 0916   RDW 16.9 (H) 10/28/2021 0916   RDW 15.8 (H) 05/12/2021 0921   LYMPHSABS 0.7 10/27/2021 0811   MONOABS 0.4 10/27/2021 0811   EOSABS 0.0  10/27/2021 0811   BASOSABS 0.1 10/27/2021 0811    CMP     Component Value Date/Time   NA 140 10/29/2021 0848   NA 139 05/12/2021 0921   K 5.0 10/29/2021 0848   CL 113 (H) 10/29/2021 0443   CO2 17 (L) 10/29/2021 0443   GLUCOSE 133 (H) 10/29/2021 0443   BUN 43 (H) 10/29/2021 0443   BUN 10 05/12/2021 0921   CREATININE 2.91 (H) 10/29/2021 0443   CREATININE 1.01 12/09/2014 0926   CALCIUM 8.1 (L) 10/29/2021 0443   PROT 5.3 (L) 10/28/2021 0916   PROT 7.8 05/12/2021 0921   ALBUMIN 2.2 (L) 10/29/2021 0443   ALBUMIN 4.9 (H) 05/12/2021 0921   AST 51 (H) 10/28/2021 0916   ALT 29 10/28/2021 0916   ALKPHOS 72 10/28/2021 0916   BILITOT 0.2 (L) 10/28/2021 0916   BILITOT 0.2 05/12/2021 0921   GFRNONAA 23 (L) 10/29/2021 0443   GFRAA  80 12/07/2020 1529   UDS negative on arrival EtOH undetectable on arrival  CSF studies with glucose 66, RBC 450, WBC 3, protein 152   EEG this admission  Spot EEG 10/31: -Background suppression, generalized - Intermittent slow, generalized  LTM 10/23/2021 1806 to 10/24/2021 1646 This study showed two seizures without clinical signs arising from right anterior temporal region on 10/24/2021 at 0726 and 0813, lasting about 1 minute each.  Additionally there are lateralized periodic discharges with overriding fast activity which is on the ictal-interictal continuum with high potential for seizure recurrence. There is also profound diffuse encephalopathy, nonspecific etiology but likely related to sedation.   LTM 10/24/2021 1846 to 10/25/2021 1846 This study showed multiple seizures without clinical signs arising from right anterior temporal region as described above, lasting about 30 seconds to 1 minute. Last seizure was on 10/25/2021 at 0933. Additionally there are lateralized periodic discharges with overriding fast activity which is on the ictal-interictal continuum with high potential for seizure recurrence. There is also profound diffuse encephalopathy,  nonspecific etiology but likely related to sedation.   LTM 10/25/2021 1846 to 10/26/2021 1846 Generalized burst suppression. No seizures were seen during this study. Patient event button was pressed on 10/26/2021 at 1741. Per RN, patient was shivering while placing catheter. Concomitant eeg did not show any eeg change to suggest seizure. This was most likely not an epileptic event. EEG appears to be improving compared to previous day.  LTM 10/26/2021 1846 to 10/27/2021 0800 Generalized burst suppression. No seizures were seen during this study.     LTM 10/27/2021 - 10/28/2021 IMPRESSION: This study is suggestive of profound diffuse encephalopathy, nonspecific etiology but likely related to sedation. No seizures were seen during this study.  LTM 11/5-11/6 Burst suppression IMPRESSION: This study is suggestive of profound diffuse encephalopathy, nonspecific etiology but likely related to sedation. No seizures or epileptiform discharges were seen during this study.  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  CT perfusion with no acute infarction or perfusion deficit.  MR brain w+w/o - NAD  Assessment: 70 year old man with history of hypertension, peripheral vascular disease, congenital truck,, cataract, tobacco abuse, left frontal and occipital traumatic contusion with small left frontal SDH in  2019, history of alcohol abuse who presented as a code stroke for right gaze and left-sided weakness with questionable seizure activity reported by bystander with blood pressures elevated on scene and in the ER with repeat generalized tonic-clonic seizure with eye deviation to the left x2 in the emergency room for which she received Ativan and Keppra load and after the second episode also loaded with fosphenytoin. He has been in burst suppression x72 hrs with no seizures during that time. Propofol and Versed were weaned off yesterday-but propofol restarted at night for vent dyssynchrony. It was discontinued again this morning. I would continue to hold sedation as much as possible  Impression: -Seizures (has been evaluated by EEG in 2019 for possible syncope but no documented h/o seizures in the  past), status epilepticus with ensuing Todd's paralysis - Currently in burst suppression on EEG, no sz past 72 hours -PLEDs initially-resolved, CSF has increased protein - ?HSV encephalitis -Evaluate for stroke due to presenting with stroke like symptoms -Hypertensive emergency  Recommendations: Continue current AED regimen - vimpat 232m bid, keppra 15058mbid, phenobarb 6555mid EEG with burst suppression -continues to be burst suppressed.   Was started back on propofol again last night.  I would recommend discontinuing propofol. I would recommend trying Precedex for sedation if needed. Continue LTM until off sedation without recurrent seizures, after that can follow clinical exam HSV PCR came back negative and acyclovir was discontinued Continue ceftriaxone and azithromycin for empiric coverage of PNA, avoid abx that can decrease seizure threshold Metabolic derangement management per primary team as you are Seizure precautions  D/w Dr. DewErin FullingRITICAL CARE ATTESTATION Performed by: AshAmie PortlandD Total critical care time: 32 minutes Critical care time was exclusive of separately billable  procedures and treating other patients and/or supervising APPs/Residents/Students Critical care was necessary to treat or prevent imminent or life-threatening deterioration due to left This patient is critically ill and at significant risk for neurological worsening and/or death and care requires constant monitoring. Critical care was time spent personally by me on the following activities: development of treatment plan with patient and/or surrogate as well as nursing, discussions with consultants, evaluation of patient's response to treatment, examination of patient, obtaining history from patient or surrogate, ordering and performing treatments and interventions, ordering and review of laboratory studies, ordering and review of radiographic studies, pulse oximetry, re-evaluation of patient's condition, participation in multidisciplinary rounds and medical decision making of high complexity in the care of this patient.

## 2021-10-29 NOTE — Progress Notes (Signed)
Upon RN assessment pt left hand and forearm is dark brown/black with multiple open skin wounds and blisters. RN was told in report that levo may have infiltrated through an IV. According to charting pt had left hand IV with levo infusing on 11/2 and 11/3. IV watch was in use. IV removed 11/3 2000 and levo moved to right hand. 11/4 was charted as skin tear and blister. 11/6 Informed Dr. Erin Fulling. Wound care ordered for stat. Arm elevated 4 in above heart and heat pack placed according to vasopressor extravasation order set. Orders placed for phentolamine and nitro. Juliann Pulse, pharmacist for unit 11/6, stated that the medications this far out would not be beneficial. Awaiting wound care to see. Photo below Media Information Document Information  Photos    10/29/2021 12:46  Attached To:  Hospital Encounter on 10/23/21   Source Information  Newman Nickels, RN  [facility]  [facility].

## 2021-10-29 NOTE — Progress Notes (Signed)
[facility] Progress Note Patient Name: JAHEEM HEDGEPATH DOB: 23-Mar-1951 MRN: [id number]   Date of Service  10/29/2021  HPI/Events of Note  Hyperkalemia - K+ = 6.3. QRS not widened or T wave peaked. Already has Lokelma ordered TID, however, will not get dose until 10 AM.  eICU Interventions  Plan: Lokelma 10 gm per tube now. Repeat BMP at 12 noon.     Intervention Category Major Interventions: Electrolyte abnormality - evaluation and management  Nhia Heaphy Eugene 10/29/2021, 5:59 AM

## 2021-10-29 NOTE — Progress Notes (Signed)
[facility] Progress Note Patient Name: Jeffrey Mullen DOB: [birth year] MRN: [id number]   Date of Service  10/29/2021  HPI/Events of Note  Frequent liquid stools - Nursing request for Flexiseal.  eICU Interventions  Plan: Place Flexiseal.      Intervention Category Major Interventions: Other:  Lysle Dingwall 10/29/2021, 10:30 PM

## 2021-10-29 NOTE — Progress Notes (Signed)
Multiple deep tissue injuries noted on patients buttocks. No prior documentation of any injuries. Large bruising to BL buttocks. Also smaller DTI on the right side where thigh and buttocks meet. Pt also noted to have a skin tear and MASD medially. All new findings have been documented. Picture below for reference.

## 2021-10-29 NOTE — Procedures (Addendum)
MRN: [id number]  Epilepsy Attending: Lora Havens  Referring Physician/Provider: Dr Rosalin Hawking Duration: 10/28/2021 1846 to 10/29/2021 1846   Patient history: 70 y.o. male with PMH of HTN, PVD, congenital glaucoma, cataract, smoker, left frontal and occipital traumatic contusion with small left frontal SDH in 2019 presented to ED for right gaze and left sided weakness with questionable seizure activity. EEG to evaluate for seizure   Level of alertness: comatose   AEDs during EEG study: LEV,LCM, Phenobarb, Propofol   Technical aspects: This EEG study was done with scalp electrodes positioned according to the 10-20 International system of electrode placement. Electrical activity was acquired at a sampling rate of 500Hz  and reviewed with a high frequency filter of 70Hz  and a low frequency filter of 1Hz . EEG data were recorded continuously and digitally stored.    Description: EEG showed burst suppression with generalized bursts of 5-6hz  polymorphic theta slowing lasting 1-3 seconds as well as eeg suppression lasting about  3-5 seconds. Hyperventilation and photic stimulation were not performed.      ABNORMALITY -Burst suppression, generalized   IMPRESSION: This study is suggestive of profound diffuse encephalopathy, nonspecific etiology but likely related to sedation. No seizures or epileptiform discharges were seen during this study.    Jeffrey Mullen

## 2021-10-29 NOTE — Progress Notes (Addendum)
[facility] Progress Note  Subjective: seen in ICU, on vent and not responsive. Propofol turned off earlier this am. Other gtt's all off today.  K+ high 6.3. creat up 2.9.   Vitals:   10/29/21 0500 10/29/21 0600 10/29/21 0736 10/29/21 0800  BP: (!) 153/53 (!) 154/53 (!) 169/45 (!) 156/62  Pulse: 76 76  64  Resp: (!) 25 (!) 28  (!) 33  Temp:    (!) 100.5 F (38.1 C)  TempSrc:    Axillary  SpO2: 94% 94% 96% 90%  Weight:      Height:        Exam: on vent ,not responsive  no jvd  throat ett in place and OG  Chest cta bilat and lat  Cor reg no RG  Abd soft ntnd no ascites   Ext diffuse 2+ bilat UE edema and 2-3+ bilat LE edema + scrotal edema   Neuro on vent , not responsive       Home meds include - norvasc, asa, lipitor, buspar, advil, lisinopril-hctz, vit/ supps/ prn's/ eye gtts         Hypotension on pressor support 11/1 - 11/4 Total I/O = 27.4 L in and 9.2 L out = +18 L    CTA head/ neck on 10/31 = 100 cc IV contrast   MRI brain on 11/01 w/ 8 cc Gadavist    CXR 11/5 - small bilat effusions, stable moderate bibasilar atelectasis    Renal US 11/3 > 10- 12 cm kidneys w/o hydro or ^echotexture        UA 10/31 - negative    UA 11/3 - negative    UA 11/4- negative    UNa < 10, UCr 193.3    Assessment/ Plan: Acute renal failure - admit creat 1.0, then AKI w/ creat up to 2.1 on 11/5. Likely ATN due to IV contrast injury + hypoperfusion/ hypotension which required pressor support for 3-4 days. Less likely acyclovir renal injury. UA's all normal, renal US w/o obstruction. BP's now normal, sig +vol overloaded on exam and +17 L since admit. Making urine still. Started IV lasix 80 bid, not great response, continue for now. Creat up 2.9 today, BUN 43. No indication for RRT yet. Will follow.  Hyperkalemia - K+ 6.3 today; have changed to low K+ Nepro tube feeds, gave IV ins/ glu/ Ca this am, and will get lokelma x 3.  Status epilepticus - AED regimen per neurology,  weaned off of drips today PNA - getting IV rocephin / zithromax Vol overload - see above H/o HTN - no need for BP lowering meds yet          Rob Criss Bartles 10/29/2021, 10:31 AM   Recent Labs  Lab 10/27/21 0502 10/27/21 0811 10/28/21 0916 10/28/21 1017 10/29/21 0443 10/29/21 0848  K 3.3*   < > 5.4* 5.0 6.3* 5.0  BUN 11   < > 29*  --  43*  --   CREATININE 1.59*   < > 2.39*  --  2.91*  --   CALCIUM 7.7*   < > 7.5*  --  8.1*  --   PHOS 3.1  --   --   --  4.9*  --   HGB 10.5*   < > 8.8* 8.5*  --  8.8*   < > = values in this interval not displayed.   Inpatient medications:  chlorhexidine gluconate (MEDLINE KIT)  15 mL Mouth Rinse BID   Chlorhexidine Gluconate Cloth  6 each  Topical Daily   feeding supplement (PROSource TF)  90 mL Per Tube BID   folic acid  1 mg Intravenous Daily   furosemide  80 mg Intravenous Q12H   heparin  5,000 Units Subcutaneous Q8H   insulin aspart  0-15 Units Subcutaneous Q4H   insulin aspart  4 Units Subcutaneous Q4H   ipratropium-albuterol  3 mL Nebulization Q6H   mouth rinse  15 mL Mouth Rinse 10 times per day   multivitamin with minerals  1 tablet Per Tube Daily   pantoprazole (PROTONIX) IV  40 mg Intravenous Q24H   PHENObarbital  65 mg Intravenous BID   sodium zirconium cyclosilicate  10 g Per Tube TID   [START ON 11/01/2021] thiamine injection  100 mg Intravenous Daily    sodium chloride     sodium chloride Stopped (10/28/21 1957)   azithromycin Stopped (10/28/21 1215)   cefTRIAXone (ROCEPHIN)  IV Stopped (10/28/21 0959)   feeding supplement (NEPRO CARB STEADY) 1,000 mL (10/29/21 7573)   lacosamide (VIMPAT) IV 200 mg (10/28/21 2239)   levETIRAcetam 500 mg (10/29/21 0955)   propofol (DIPRIVAN) infusion Stopped (10/29/21 0840)   thiamine injection Stopped (10/28/21 1031)   sodium chloride, acetaminophen (TYLENOL) oral liquid 160 mg/5 mL, docusate, fentaNYL (SUBLIMAZE) injection, fentaNYL (SUBLIMAZE) injection, midazolam PF **AND** midazolam  **AND** [DISCONTINUED] midazolam, polyethylene glycol

## 2021-10-29 NOTE — Progress Notes (Signed)
LTM maintance done. F7 lead off= reapplied. Fp1 and Fp2 moved to lessen redness. No brken skin noted. Imped=good. Results pending.

## 2021-10-29 NOTE — Progress Notes (Signed)
LTM check done. Watching closely on forehead leads with redness. Impedance good. Results pending. Informed nurse to be careful with head leads and specially the forehead.

## 2021-10-29 NOTE — Progress Notes (Signed)
NAME:  Jeffrey Mullen, MRN:  [id number], DOB:  [birth year], LOS: 6 ADMISSION DATE:  10/23/2021, CONSULTATION DATE:  10/31 REFERRING MD:  Dr. Dina Rich, CHIEF COMPLAINT:  AMS   History of Present Illness:  70 y/o M who presented to [facility] ER on 10/31 via EMS with reports of altered mental status.  Given  right sided gaze, altered and left sided weakness, code stoke called. The patient was taken emergently to CT for imaging which was negative for LVO.  While in the [place], he had a witnessed seizure.    PCCM called for ICU admission.   Chart review shows he was seen in the ER on 10/4 after passing out at the [employer].  Physical exam was negative at the time.  Labs negative.  No further imaging obtained.   Pertinent  Medical History  Alcohol Use - unclear quantity Anxiety / Depression  Congenital blindness of left eye, cataract on R Hepatitis C+, treated in 2016 Seizures   Significant Hospital Events: Including procedures, antibiotic start and stop dates in addition to other pertinent events   10/31 Admit with AMS, seizure, hypertensive emergency, AKI 11/1 MRI brain no acute intracranial process. No etiology is seen for the patient's seizure. 11/02 bedside LP attempted but unsuccessful  11/03  No seizures seen on EEG overnight  11/04 weaning sedation to assess for seizure reoccurrence   Interim History / Subjective:  No acute events overnight.   PEEP remains at 12. Reduced FiO2 to 70% this AM.   Cr continues to rise, minimal urine output despite adding lasix yesterday.  Objective   Blood pressure (!) 154/53, pulse 76, temperature 99.5 F (37.5 C), temperature source Oral, resp. rate (!) 28, height 5\' 10"  (1.778 m), weight 80.3 kg, SpO2 94 %.    Vent Mode: PRVC FiO2 (%):  [50 %-100 %] 100 % Set Rate:  [26 bmp] 26 bmp Vt Set:  [440 mL] 440 mL PEEP:  [10 YIF02-77 cmH20] 12 cmH20 Plateau Pressure:  [16 cmH20-25 cmH20] 19 cmH20   Intake/Output Summary (Last 24 hours) at  10/29/2021 4128 Last data filed at 10/29/2021 7867 Gross per 24 hour  Intake 3576.95 ml  Output 1125 ml  Net 2451.95 ml   Filed Weights   10/25/21 0355 10/26/21 0500 10/27/21 0500  Weight: 80.3 kg 80 kg 80.3 kg    Examination: General: ill appearing male, no acute distress, intubated/sedated HEENT: ETT, MM pink/moist, PRRL on right, cataract on left Neuro: not following commands, right pupil reactive to light CV: s1s2 regular rate and rhythm, no murmur, rubs, or gallops PULM:  course breath sounds, no wheezing or rhonchi GI: soft, bowel sounds active in all 4 quadrants,  non-distended, tolerating TF Extremities: warm/dry, diffuse 2+ edema upper and lower extremities Skin: skin tear left dorsal service, extravasation injury to dorsal side of left hand and arm.   Resolved Hospital Problem list     Assessment & Plan:  Ongoing status epilepticus -No seizure seen on EEG since 11/3 History of EtOH abuse, prior SDH -LVO ruled out on CT.  UDS neg.  MRI benign.  Ongoing cEEG. P: Primary management per neurology  Maintain neuro protective measures; goal for eurothermia, euglycemia, eunatermia, normoxia, and PCO2 goal of 35-40 Nutrition and bowel regiment  Seizure precautions  AEDs per neurology  Aspirations precautions  EEG per neurology  Sedation has been weaned, remains on propofol for vent synchronny CSF culture negative to date Continue high dose thiamine  Empiric acyclovir discontinued as HSV pcr  from CSF is negative.   Acute Hypoxic Respiratory Failure  RLL Pneumonia, Haemophilus influenzae -in the setting of status  P: Continue ventilator support with lung protective strategies  Increasing peep over last 48 hours due to volume overload Repeat ABG this AM VAP bundle in place  PAD protocol Continue ceftriaxone, azithromycin   AKI  Non-anion Gap Metabolic Acidosis Hyperkalemia -Baseline sr cr ~0.9 to 1  P: Follow renal function  Monitor urine output Trend  Bmet Avoid nephrotoxins Ensure adequate renal perfusion  Renal following, no plan for CRRT yet Lasix started 11/5 Lokelma scheduled  Hyperglycemia P: Continue TF SSI q4 4 units q4hrs for TF coverage  Vasopressor Extravasation Skin Injury - left hand/arm Left Arm Skin Tear - wound care per nursing - consult wound care - phentolamine injection today - nitroglycerin ointment to affected area  Complex social dynamics -May need help figuring out POA  Best Practice (right click and "Reselect all SmartList Selections" daily)   Diet/type: tubefeeds,  DVT prophylaxis: prophylactic heparin  GI prophylaxis: PPI Lines: N/A Foley:  foley in place Code Status:  full code Last date of multidisciplinary goals of care discussion - pending, complex family dynamics   Total critical care time: 68 minutes  Freda Jackson, MD [place] Office: [phone]   See Amion for personal pager PCCM on call pager [phone] until 7pm. Please call Elink 7p-7a. [phone]

## 2021-10-30 DIAGNOSIS — J14 Pneumonia due to Hemophilus influenzae: Secondary | ICD-10-CM | POA: Diagnosis not present

## 2021-10-30 DIAGNOSIS — G40901 Epilepsy, unspecified, not intractable, with status epilepticus: Secondary | ICD-10-CM | POA: Diagnosis not present

## 2021-10-30 DIAGNOSIS — J9601 Acute respiratory failure with hypoxia: Secondary | ICD-10-CM | POA: Diagnosis not present

## 2021-10-30 LAB — GLUCOSE, CAPILLARY
Glucose-Capillary: 172 mg/dL — ABNORMAL HIGH (ref 70–99)
Glucose-Capillary: 128 mg/dL — ABNORMAL HIGH (ref 70–99)
Glucose-Capillary: 195 mg/dL — ABNORMAL HIGH (ref 70–99)
Glucose-Capillary: 136 mg/dL — ABNORMAL HIGH (ref 70–99)
Glucose-Capillary: 80 mg/dL (ref 70–99)
Glucose-Capillary: 112 mg/dL — ABNORMAL HIGH (ref 70–99)

## 2021-10-30 LAB — CBC
HCT: 27.3 % — ABNORMAL LOW (ref 39.0–52.0)
Hemoglobin: 9.2 g/dL — ABNORMAL LOW (ref 13.0–17.0)
MCH: 28.3 pg (ref 26.0–34.0)
MCHC: 33.7 g/dL (ref 30.0–36.0)
MCV: 84 fL (ref 80.0–100.0)
Platelets: 156 10*3/uL (ref 150–400)
RBC: 3.25 MIL/uL — ABNORMAL LOW (ref 4.22–5.81)
RDW: 16.5 % — ABNORMAL HIGH (ref 11.5–15.5)
WBC: 17.6 10*3/uL — ABNORMAL HIGH (ref 4.0–10.5)
nRBC: 0.5 % — ABNORMAL HIGH (ref 0.0–0.2)

## 2021-10-30 LAB — RENAL FUNCTION PANEL
Albumin: 2.1 g/dL — ABNORMAL LOW (ref 3.5–5.0)
Anion gap: 10 (ref 5–15)
BUN: 55 mg/dL — ABNORMAL HIGH (ref 8–23)
CO2: 18 mmol/L — ABNORMAL LOW (ref 22–32)
Calcium: 8 mg/dL — ABNORMAL LOW (ref 8.9–10.3)
Chloride: 113 mmol/L — ABNORMAL HIGH (ref 98–111)
Creatinine, Ser: 2.35 mg/dL — ABNORMAL HIGH (ref 0.61–1.24)
GFR, Estimated: 30 mL/min — ABNORMAL LOW (ref >60)
Glucose, Bld: 168 mg/dL — ABNORMAL HIGH (ref 70–99)
Phosphorus: 4.2 mg/dL (ref 2.5–4.6)
Potassium: 4.5 mmol/L (ref 3.5–5.1)
Sodium: 141 mmol/L (ref 135–145)

## 2021-10-30 LAB — CK: Total CK: 7403 U/L — ABNORMAL HIGH (ref 49–397)

## 2021-10-30 LAB — TRIGLYCERIDES: Triglycerides: 312 mg/dL — ABNORMAL HIGH (ref <150)

## 2021-10-30 LAB — PATHOLOGIST SMEAR REVIEW

## 2021-10-30 MED ORDER — HYDRALAZINE HCL 20 MG/ML IJ SOLN
10.0000 mg | INTRAMUSCULAR | Status: AC | PRN
  Administered 2021-10-30 – 2021-11-02 (×9): 20 mg via INTRAVENOUS
  Filled 2021-10-30 (×9): qty 1

## 2021-10-30 MED ORDER — FOLIC ACID 1 MG PO TABS
1.0000 mg | ORAL_TABLET | Freq: Every day | ORAL | Status: DC
  Administered 2021-10-30 – 2021-12-04 (×35): 1 mg
  Filled 2021-10-30 (×35): qty 1

## 2021-10-30 MED ORDER — DEXMEDETOMIDINE HCL IN NACL 400 MCG/100ML IV SOLN
0.4000 ug/kg/h | INTRAVENOUS | Status: DC
  Administered 2021-10-30: 0.7 ug/kg/h via INTRAVENOUS
  Administered 2021-10-30: 0.4 ug/kg/h via INTRAVENOUS
  Administered 2021-10-30: 0.6 ug/kg/h via INTRAVENOUS
  Administered 2021-10-31: 0.9 ug/kg/h via INTRAVENOUS
  Administered 2021-10-31: 1 ug/kg/h via INTRAVENOUS
  Administered 2021-10-31 – 2021-11-01 (×3): 0.7 ug/kg/h via INTRAVENOUS
  Filled 2021-10-30 (×7): qty 100

## 2021-10-30 MED ORDER — PANTOPRAZOLE 2 MG/ML SUSPENSION
40.0000 mg | Freq: Every day | ORAL | Status: DC
Start: 2021-10-31 — End: 2021-11-07
  Administered 2021-10-31 – 2021-11-06 (×7): 40 mg
  Filled 2021-10-30 (×7): qty 20

## 2021-10-30 MED ORDER — IPRATROPIUM-ALBUTEROL 0.5-2.5 (3) MG/3ML IN SOLN
3.0000 mL | Freq: Four times a day (QID) | RESPIRATORY_TRACT | Status: DC
  Administered 2021-10-31 (×4): 3 mL via RESPIRATORY_TRACT
  Filled 2021-10-30 (×4): qty 3

## 2021-10-30 MED ORDER — ACETAMINOPHEN 160 MG/5ML PO SOLN
650.0000 mg | ORAL | Status: DC | PRN
  Administered 2021-10-30 – 2021-11-15 (×32): 650 mg
  Filled 2021-10-30 (×31): qty 20.3

## 2021-10-30 NOTE — Consult Note (Addendum)
[facility] Nurse Consult Note:Left hand and forearm levophed extravasation injury.  Initial injury was treated according to vasopressor extravasation order set including elevation, heat pack and order placed for phentolamine and nitroglycerin.  Pharmacy recommendations were to discontinue these medications as injury was too long ago for the patient to benefit from the vasodilation.  Will implement topical wound care for sloughing epithelium to left hand and left anterior lower arm.  Deep tissue injury noted to bilateral gluteal folds. Patient was in grocery store and then found "down" unattended after a seizure prior to coming to ED for care.    Reason for Consult: Extravasation injury to left hand and arm and deep tissue injury to bilateral gluteal folds.  Wound type: chemical (medication) injury and pressure/trauma to bilateral gluteal folds with unattended prolonged time down after a seizure.  On levophed as well, placing at higher risk for skin/pressure injury while critically ill, unresponsive and intubated.  Pressure Injury POA: No  Not noted, but had been down for prolonged period and DTI may have started then and appeared latera.  Measurement: Area affected is left hand and anterior lower arm with sloughing epithelium, full thickness injury noted.  Gluteal folds: bilateral : 18 cm x 6 cm maroon intact discoloration Wound bed: Red, moist tissue beneath sloughing epithelium to left arm and hand.  Deep tissue injury to bilateral heels: Left 3 cm x 4 cm maroon discoloration Right heel: 3 cm x 3 cm nonblanchable erythema.  Will add prevalon boots.  Drainage (amount, consistency, odor) minimal serosanguinous  no odor.  Periwound:Intact  edema to left hand and arm Dressing procedure/placement/frequency: Cleanse buttock with soap and water and pat dry. Apply barrier cream and offload pressure.  Is on low air loss mattress for pressure redistribution.  Cleanse left hand and arm with NS and pat dry.   Apply  petrolatum gauze to sloughing skin and open wounds daily. Cover with dry gauze and kerlix/tape,  Change daily.  Will not follow at this time.  Please re-consult if needed.  Domenic Moras MSN, RN, FNP-BC CWON Wound, Ostomy, Continence Nurse Pager [phone]

## 2021-10-30 NOTE — Procedures (Addendum)
MRN: [id number]  Epilepsy Attending: Lora Havens  Referring Physician/Provider: Dr Rosalin Hawking Duration: 10/29/2021 1846 to 10/30/2021 1846   Patient history: 70 y.o. male with PMH of HTN, PVD, congenital glaucoma, cataract, smoker, left frontal and occipital traumatic contusion with small left frontal SDH in 2019 presented to ED for right gaze and left sided weakness with questionable seizure activity. EEG to evaluate for seizure   Level of alertness: comatose   AEDs during EEG study: LEV,LCM, Phenobarb   Technical aspects: This EEG study was done with scalp electrodes positioned according to the 10-20 International system of electrode placement. Electrical activity was acquired at a sampling rate of 500Hz  and reviewed with a high frequency filter of 70Hz  and a low frequency filter of 1Hz . EEG data were recorded continuously and digitally stored.    Description: EEG initially showed burst suppression with generalized bursts of 5-6hz  polymorphic theta slowing lasting 1-3 seconds as well as eeg suppression lasting about  3-5 seconds. Gradually, EEG became more continuous and showed continuous generalized polymorphic 3 to 6 Hz theta-delta slowing.  Hyperventilation and photic stimulation were not performed.      ABNORMALITY -Burst suppression, generalized -Continued slow, generalized   IMPRESSION: This study was initially suggestive of profound diffuse encephalopathy and gradually improved to severe diffuse encephalopathy, nonspecific etiology but likely related to sedation. No seizures or epileptiform discharges were seen during this study.    Jeffrey Mullen [name]

## 2021-10-30 NOTE — Progress Notes (Addendum)
[facility] Progress Note  Subjective:  remains in ICU.  He had 2.7 liters UOP over 11/6 charted.  Spoke with nursing.  He has been on EEG monitoring - he is not following commands for her and is sedated.  Nursing states wound consulted for left hand/arm and buttock wounds  Review of systems: intubated and sedated - not able to obtain    Vitals:   10/30/21 0600 10/30/21 0745 10/30/21 0748 10/30/21 0800  BP: (!) 157/64     Pulse: 95     Resp: (!) 26     Temp:    98.4 F (36.9 C)  TempSrc:    Axillary  SpO2: 98% 100% 98%   Weight:      Height:        Physical Exam:  General adult male in bed intubated HEENT normocephalic atraumatic extraocular movements intact sclera anicteric; getting EEG monitoring Neck trachea midline Lungs reduced breath sounds; FIO2 50 and PEEP 12 Heart S1S2 no rub Abdomen soft nontender distended; scrotal edema Extremities 2+edema; left hand and arm discoloration and partially bandaged Neuro - on continuous sedation      Home meds include - norvasc, asa, lipitor, buspar, advil, lisinopril-hctz, vit/ supps/ prn's/ eye gtts per charting         Hypotension on pressor support 11/1 - 11/4 Total I/O = 27.4 L in and 9.2 L out = +18 L    CTA head/ neck on 10/31 = 100 cc IV contrast   MRI brain on 11/01 w/ 8 cc Gadavist    CXR 11/5 - small bilat effusions, stable moderate bibasilar atelectasis    Renal US 11/3 > 10- 12 cm kidneys w/o hydro or ^echotexture        UA 10/31 - negative    UA 11/3 - negative    UA 11/4- negative    UNa < 10, UCr 193.3    Assessment/ Plan: Acute renal failure - admit creat 1.0, then AKI w/ creat up to 2.1 on 11/5. Likely ATN due to IV contrast injury + hypoperfusion/ hypotension which required pressor support for 3-4 days. Less likely acyclovir renal injury. UA's all normal, renal US w/o obstruction. BP's now normal, sig +vol overloaded on exam and +17 L since admit.  May have had a component of rhabdo - CK  elevated On lasix 80 mg IV bid - continue for now Improving with supportive care - continue same  Trend CK Hyperkalemia - low K+ Nepro tube feeds and s/p medical measures; resolved Status epilepticus - AED regimen per neurology PNA - antibiotics per primary team - on azithro and ceftriaxone Fluid Volume overload - diuretics as above H/o HTN - optimize volume with diuretics as above     Recent Labs  Lab 10/29/21 0443 10/29/21 0848 10/29/21 1142 10/30/21 0239  K 6.3* 5.0 4.9 4.5  BUN 43*  --  43* 55*  CREATININE 2.91*  --  2.61* 2.35*  CALCIUM 8.1*  --  7.5* 8.0*  PHOS 4.9*  --   --  4.2  HGB  --  8.8*  --  9.2*   Inpatient medications:  chlorhexidine gluconate (MEDLINE KIT)  15 mL Mouth Rinse BID   Chlorhexidine Gluconate Cloth  6 each Topical Daily   feeding supplement (PROSource TF)  90 mL Per Tube BID   folic acid  1 mg Intravenous Daily   furosemide  80 mg Intravenous Q12H   heparin  5,000 Units Subcutaneous Q8H   insulin aspart  0-15  Units Subcutaneous Q4H   insulin aspart  4 Units Subcutaneous Q4H   ipratropium-albuterol  3 mL Nebulization Q6H   mouth rinse  15 mL Mouth Rinse 10 times per day   multivitamin with minerals  1 tablet Per Tube Daily   pantoprazole (PROTONIX) IV  40 mg Intravenous Q24H   PHENObarbital  65 mg Intravenous BID   [START ON 11/01/2021] thiamine injection  100 mg Intravenous Daily    sodium chloride 250 mL (10/29/21 1207)   sodium chloride 5 mL/hr at 10/30/21 0600   azithromycin 500 mg (10/29/21 1345)   cefTRIAXone (ROCEPHIN)  IV 2 g (10/29/21 1343)   feeding supplement (NEPRO CARB STEADY) 1,000 mL (10/29/21 2458)   lacosamide (VIMPAT) IV Stopped (10/29/21 2213)   levETIRAcetam Stopped (10/29/21 2156)   propofol (DIPRIVAN) infusion 15 mcg/kg/min (10/30/21 0600)   thiamine injection 250 mg (10/29/21 1208)   sodium chloride, acetaminophen (TYLENOL) oral liquid 160 mg/5 mL, docusate, fentaNYL (SUBLIMAZE) injection, fentaNYL (SUBLIMAZE)  injection, polyethylene glycol   Claudia Desanctis, MD 10/30/2021 8:37 AM

## 2021-10-30 NOTE — Plan of Care (Signed)
  Problem: Nutrition: Goal: Adequate nutrition will be maintained Outcome: Progressing   Problem: Activity: Goal: Risk for activity intolerance will decrease Outcome: Not Progressing   

## 2021-10-30 NOTE — Progress Notes (Addendum)
NAME:  Jeffrey Mullen, MRN:  [id number], DOB:  1951/01/19, LOS: 7 ADMISSION DATE:  10/23/2021, CONSULTATION DATE:  10/31 REFERRING MD:  Dr. Dina Rich, CHIEF COMPLAINT:  AMS   History of Present Illness:  70 y/o M who presented to [facility] ER on 10/31 via EMS with reports of altered mental status.  Given  right sided gaze, altered and left sided weakness, code stoke called. The patient was taken emergently to CT for imaging which was negative for LVO.  While in the [place], he had a witnessed seizure.    PCCM called for ICU admission.   Chart review shows he was seen in the ER on 10/4 after passing out at the [employer].  Physical exam was negative at the time.  Labs negative.  No further imaging obtained.   Pertinent  Medical History  Alcohol Use - unclear quantity Anxiety / Depression  Congenital blindness of left eye, cataract on R Hepatitis C+, treated in 2016 Seizures   Significant Hospital Events: Including procedures, antibiotic start and stop dates in addition to other pertinent events   10/31 Admit with AMS, seizure, hypertensive emergency, AKI 11/1 MRI brain no acute intracranial process. No etiology is seen for the patient's seizure. 11/02 bedside LP attempted but unsuccessful  11/03  No seizures seen on EEG overnight  11/04 weaning sedation to assess for seizure reoccurrence   Interim History / Subjective:  Patient remained afebrile, white count continues to trend up EEG showed no more seizures  Objective   Blood pressure (!) 157/64, pulse 95, temperature 98.4 F (36.9 C), temperature source Axillary, resp. rate (!) 26, height 5\' 10"  (1.778 m), weight 80.3 kg, SpO2 98 %.    Vent Mode: PRVC FiO2 (%):  [50 %-60 %] 50 % Set Rate:  [26 bmp] 26 bmp Vt Set:  [440 mL] 440 mL PEEP:  [12 cmH20] 12 cmH20 Plateau Pressure:  [17 cmH20-26 cmH20] 17 cmH20   Intake/Output Summary (Last 24 hours) at 10/30/2021 1056 Last data filed at 10/30/2021 0600 Gross per 24 hour  Intake  1460.16 ml  Output 3185 ml  Net -1724.84 ml   Filed Weights   10/25/21 0355 10/26/21 0500 10/27/21 0500  Weight: 80.3 kg 80 kg 80.3 kg    Examination: General: Critically ill appearing male, intubated/sedated HEENT: Atraumatic, normocephalic ETT, MM pink/moist, PRRL on right, cataract on left Neuro: Eyes closed, not following commands, right pupil reactive to light.  Positive cough and gag CV: s1s2 regular rate and rhythm, no murmur, rubs, or gallops PULM:  course breath sounds, no wheezing or rhonchi GI: soft, bowel sounds active in all 4 quadrants,  non-distended Extremities: warm/dry, diffuse 2+ edema upper and lower extremities Skin: skin tear left dorsal service, extravasation injury to dorsal side of left hand and arm.  Pressure injuries noted on sacrum   Resolved Hospital Problem list     Assessment & Plan:  Status epilepticus Alcohol abuse with prior SDH No seizure seen on EEG since 11/3 LVO ruled out on CT.  UDS neg.  MRI benign.  Ongoing cEEG. Appreciate neurology recommendations Seizure precautions  Continue lacosamide, Keppra and phenobarbital Will taper off propofol while EEG is ongoing Aspirations precautions  CSF culture negative to date Continue high dose thiamine  HSV PCR is negative, acyclovir was discontinued  Acute Hypoxic Respiratory Failure  RLL Pneumonia, Haemophilus influenzae Continue ventilator support with lung protective strategies  PEEP was decreased to 8 from 12 me overload FiO2 remains at 50 Patient having thick  copious amount of secretions via ET tube Repeat ABG this AM VAP bundle in place  PAD protocol Continue ceftriaxone, azithromycin   AKI due to contrast-induced ATN Nontraumatic rhabdomyolysis Non-anion Gap Metabolic Acidosis Hyperkalemia, resolved Baseline sr cr ~0.9 to 1  Patient is making lots of amount of urine Serum creatinine started improving Appreciate renal follow-up Trend Bmet Avoid nephrotoxins Ensure adequate  renal perfusion  Renal following, no plan for CRRT yet Continue Lasix 80 mg twice daily Lokelma scheduled CK is elevated could be related to status  Prediabetes Patient hemoglobin A1c 6.0 Continue SSI q4 4 units q4hrs for TF coverage  Vasopressor Extravasation Skin Injury - left hand/arm Left Arm Skin Tear Unstageable sacral ulcer (POA) Appreciate wound care follow-up S/p phentolamine and nitroglycerin application Change posterior every 2 hours Follow-up wound care recommendations  Complex social dynamics -May need help figuring out POA  Best Practice (right click and "Reselect all SmartList Selections" daily)   Diet/type: tubefeeds,  DVT prophylaxis: prophylactic heparin  GI prophylaxis: PPI Lines: N/A Foley:  foley in place Code Status:  full code Last date of multidisciplinary goals of care discussion - pending, complex family dynamics    Total critical care time: 46 minutes  Performed by: Jacky Kindle   Critical care time was exclusive of separately billable procedures and treating other patients.   Critical care was necessary to treat or prevent imminent or life-threatening deterioration.   Critical care was time spent personally by me on the following activities: development of treatment plan with patient and/or surrogate as well as nursing, discussions with consultants, evaluation of patient's response to treatment, examination of patient, obtaining history from patient or surrogate, ordering and performing treatments and interventions, ordering and review of laboratory studies, ordering and review of radiographic studies, pulse oximetry and re-evaluation of patient's condition.   Jacky Kindle MD [facility] See Amion for pager If no response to pager, please call [phone] until 7pm After 7pm, Please call E-link [phone]

## 2021-10-30 NOTE — Progress Notes (Signed)
LTM maintanance done. Patient still slightly red mainly on forehead. Will monitor closely. Test button tested.

## 2021-10-30 NOTE — Progress Notes (Signed)
eLink Physician-Brief Progress Note Patient Name: Jeffrey Mullen DOB: Mar 22, 1951 MRN: [id number]   Date of Service  10/30/2021  HPI/Events of Note  Patient with elevated BP.  eICU Interventions  PRN iv Hydralazine ordered.        Kerry Kass Netha Dafoe 10/30/2021, 9:44 PM

## 2021-10-30 NOTE — Progress Notes (Incomplete)
LTM maint complete - no skin breakdown under: no skin breakdown under Fp1 F3 F7 , slight breakdown around Fp2, lead moved , nurse informed. .event

## 2021-10-30 NOTE — Progress Notes (Addendum)
Subjective: No seizures overnight  ROS: Unable to obtain due to poor mental status  Examination  Vital signs in last 24 hours: Temp:  [98.4 F (36.9 C)-100.5 F (38.1 C)] 98.4 F (36.9 C) (11/07 0800) Pulse Rate:  [51-96] 95 (11/07 0600) Resp:  [8-31] 26 (11/07 0600) BP: (146-169)/(45-70) 157/64 (11/07 0600) SpO2:  [88 %-100 %] 98 % (11/07 0748) FiO2 (%):  [50 %-60 %] 50 % (11/07 0748)  General: lying in bed, NAD CVS: pulse-normal rate and rhythm RS: intubated, coarse breath sound BL Extremities: warm, +edema Neuro: comatose ( on precedex), doesn't open eyes to noxious stimuli, left corneal opacity, right pupil reactive to light, right corneal reflex intact, gag reflex intact, doesn't withdraw to noxious stimuli in all extremities  Basic Metabolic Panel: Recent Labs  Lab 10/25/21 1606 10/26/21 0356 10/26/21 1646 10/27/21 0502 10/27/21 1639 10/28/21 0304 10/28/21 0347 10/28/21 0916 10/28/21 1017 10/29/21 0443 10/29/21 0848 10/29/21 1142 10/30/21 0239  NA  --  142  --  142   < > 136  134*   < > 137 138 140 140 141 141  K  --  4.2  --  3.3*   < > 5.7*  5.6*   < > 5.4* 5.0 6.3* 5.0 4.9 4.5  CL  --  115*  --  115*  --  111  111  --  109  --  113*  --  114* 113*  CO2  --  21*  --  17*  --  15*  16*  --  18*  --  17*  --  17* 18*  GLUCOSE  --  114*  --  128*  --  246*  247*  --  231*  --  133*  --  139* 168*  BUN  --  8  --  11  --  25*  25*  --  29*  --  43*  --  43* 55*  CREATININE  --  1.52*  --  1.59*  --  2.26*  2.15*  --  2.39*  --  2.91*  --  2.61* 2.35*  CALCIUM  --  7.5*  --  7.7*  --  7.5*  7.4*  --  7.5*  --  8.1*  --  7.5* 8.0*  MG 1.9 1.8 2.3 1.5*  --   --   --  2.1  --   --   --   --   --   PHOS 4.4 5.0* 4.1 3.1  --   --   --   --   --  4.9*  --   --  4.2   < > = values in this interval not displayed.    CBC: Recent Labs  Lab 10/23/21 1415 10/23/21 1417 10/24/21 0615 10/24/21 0618 10/27/21 0502 10/27/21 0811 10/27/21 1639 10/28/21 0347  10/28/21 0916 10/28/21 1017 10/29/21 0848 10/30/21 0239  WBC 12.6*  --  8.2  --  5.8 7.3  --   --  13.7*  --   --  17.6*  NEUTROABS 7.5  --   --   --   --  4.6  --   --   --   --   --   --   HGB 13.3   < > 10.8*   < > 10.5* 10.8*   < > 9.2* 8.8* 8.5* 8.8* 9.2*  HCT 42.3   < > 30.7*   < > 33.3* 34.4*   < > 27.0* 28.0* 25.0* 26.0*  27.3*  MCV 90.2  --  80.6  --  87.6 88.7  --   --  86.4  --   --  84.0  PLT 274  --  199  --  172 187  --   --  142*  --   --  156   < > = values in this interval not displayed.     Coagulation Studies: No results for input(s): LABPROT, INR in the last 72 hours.  Imaging MRI brain with and without contrast 10/24/2021: No acute intracranial process. No etiology is seen for the patient's seizure.    ASSESSMENT AND PLAN: 70 year old male with history of hypertension, peripheral vascular disease, cataract, tobacco abuse, left frontal and occipital traumatic contusion with small left frontal SDH in 2019, history of alcohol abuse who presented with focal seizures which have since resolved.  New onset seizures Sacral ulcer Acute encephalopathy ( post-ictal, medications, infectious) -Unclear etiology.  Will ask patient about alcohol use once he is more awake.  Ethanol level was negative on arrival, does have sacral ulcer with leucocytosis.   Recommendations -Continue phenobarbital 65 mg twice daily, Vimpat 200 mg twice daily, Keppra 500 mg twice daily (renal dosing). Will check phenobabr level tomorrow.  -Will likely dc ltm tomorrow if he remains seizure-free.   - if any seizure overnight, can increase phenobarb to 130mg  -Management of rest of comorbidities per primary team -Continue seizure precautions -PRN IV Ativan 2 mg clinical seizure-like activity  I have spent a total of 28 minutes with the patient reviewing hospital notes,  test results, labs and examining the patient as well as establishing an assessment and plan.  > 50% of time was spent in direct  patient care.    Zeb Comfort Epilepsy [facility] For questions after 5pm please refer to AMION to reach the Neurologist on call

## 2021-10-31 DIAGNOSIS — N179 Acute kidney failure, unspecified: Secondary | ICD-10-CM | POA: Diagnosis not present

## 2021-10-31 DIAGNOSIS — J9601 Acute respiratory failure with hypoxia: Secondary | ICD-10-CM | POA: Diagnosis not present

## 2021-10-31 DIAGNOSIS — R569 Unspecified convulsions: Secondary | ICD-10-CM | POA: Diagnosis not present

## 2021-10-31 DIAGNOSIS — G40901 Epilepsy, unspecified, not intractable, with status epilepticus: Secondary | ICD-10-CM | POA: Diagnosis not present

## 2021-10-31 LAB — MAGNESIUM: Magnesium: 2 mg/dL (ref 1.7–2.4)

## 2021-10-31 LAB — CBC
HCT: 26.5 % — ABNORMAL LOW (ref 39.0–52.0)
Hemoglobin: 8.9 g/dL — ABNORMAL LOW (ref 13.0–17.0)
MCH: 28 pg (ref 26.0–34.0)
MCHC: 33.6 g/dL (ref 30.0–36.0)
MCV: 83.3 fL (ref 80.0–100.0)
Platelets: 167 10*3/uL (ref 150–400)
RBC: 3.18 MIL/uL — ABNORMAL LOW (ref 4.22–5.81)
RDW: 15.6 % — ABNORMAL HIGH (ref 11.5–15.5)
WBC: 12.3 10*3/uL — ABNORMAL HIGH (ref 4.0–10.5)
nRBC: 0.2 % (ref 0.0–0.2)

## 2021-10-31 LAB — GLUCOSE, CAPILLARY
Glucose-Capillary: 118 mg/dL — ABNORMAL HIGH (ref 70–99)
Glucose-Capillary: 235 mg/dL — ABNORMAL HIGH (ref 70–99)
Glucose-Capillary: 135 mg/dL — ABNORMAL HIGH (ref 70–99)
Glucose-Capillary: 80 mg/dL (ref 70–99)
Glucose-Capillary: 88 mg/dL (ref 70–99)
Glucose-Capillary: 129 mg/dL — ABNORMAL HIGH (ref 70–99)
Glucose-Capillary: 178 mg/dL — ABNORMAL HIGH (ref 70–99)

## 2021-10-31 LAB — BASIC METABOLIC PANEL
Anion gap: 11 (ref 5–15)
BUN: 45 mg/dL — ABNORMAL HIGH (ref 8–23)
CO2: 25 mmol/L (ref 22–32)
Calcium: 9 mg/dL (ref 8.9–10.3)
Chloride: 113 mmol/L — ABNORMAL HIGH (ref 98–111)
Creatinine, Ser: 1.28 mg/dL — ABNORMAL HIGH (ref 0.61–1.24)
GFR, Estimated: >60 mL/min (ref >60)
Glucose, Bld: 283 mg/dL — ABNORMAL HIGH (ref 70–99)
Potassium: 3 mmol/L — ABNORMAL LOW (ref 3.5–5.1)
Sodium: 149 mmol/L — ABNORMAL HIGH (ref 135–145)

## 2021-10-31 LAB — RENAL FUNCTION PANEL
Albumin: 2.1 g/dL — ABNORMAL LOW (ref 3.5–5.0)
Anion gap: 12 (ref 5–15)
BUN: 49 mg/dL — ABNORMAL HIGH (ref 8–23)
CO2: 24 mmol/L (ref 22–32)
Calcium: 8.8 mg/dL — ABNORMAL LOW (ref 8.9–10.3)
Chloride: 111 mmol/L (ref 98–111)
Creatinine, Ser: 1.3 mg/dL — ABNORMAL HIGH (ref 0.61–1.24)
GFR, Estimated: >60 mL/min (ref >60)
Glucose, Bld: 281 mg/dL — ABNORMAL HIGH (ref 70–99)
Phosphorus: 2.2 mg/dL — ABNORMAL LOW (ref 2.5–4.6)
Potassium: 2.9 mmol/L — ABNORMAL LOW (ref 3.5–5.1)
Sodium: 147 mmol/L — ABNORMAL HIGH (ref 135–145)

## 2021-10-31 LAB — PHENOBARBITAL LEVEL: Phenobarbital: 30.6 ug/mL — ABNORMAL HIGH (ref 15.0–30.0)

## 2021-10-31 MED ORDER — VITAL 1.5 CAL PO LIQD
1000.0000 mL | ORAL | Status: DC
  Administered 2021-11-01 – 2021-11-05 (×4): 1000 mL
  Filled 2021-10-31: qty 1000

## 2021-10-31 MED ORDER — PHENOBARBITAL SODIUM 65 MG/ML IJ SOLN
65.0000 mg | Freq: Every day | INTRAMUSCULAR | Status: DC
Start: 2021-11-01 — End: 2021-11-02
  Administered 2021-11-01 – 2021-11-02 (×2): 65 mg via INTRAVENOUS
  Filled 2021-10-31 (×2): qty 1

## 2021-10-31 MED ORDER — POTASSIUM PHOSPHATES 15 MMOLE/5ML IV SOLN
30.0000 mmol | Freq: Once | INTRAVENOUS | Status: AC
  Administered 2021-10-31: 30 mmol via INTRAVENOUS
  Filled 2021-10-31: qty 10

## 2021-10-31 MED ORDER — SODIUM CHLORIDE 0.9 % IV SOLN
3.0000 g | Freq: Four times a day (QID) | INTRAVENOUS | Status: DC
  Administered 2021-10-31 – 2021-11-03 (×14): 3 g via INTRAVENOUS
  Filled 2021-10-31 (×14): qty 8

## 2021-10-31 MED ORDER — AMLODIPINE BESYLATE 10 MG PO TABS
10.0000 mg | ORAL_TABLET | Freq: Every day | ORAL | Status: DC
  Administered 2021-10-31 – 2021-11-06 (×7): 10 mg
  Filled 2021-10-31 (×7): qty 1

## 2021-10-31 MED ORDER — CLONIDINE HCL 0.2 MG PO TABS
0.2000 mg | ORAL_TABLET | Freq: Three times a day (TID) | ORAL | Status: DC
  Administered 2021-10-31: 0.2 mg
  Filled 2021-10-31: qty 1

## 2021-10-31 MED ORDER — SODIUM CHLORIDE 0.45 % IV SOLN: INTRAVENOUS | Status: DC

## 2021-10-31 MED ORDER — LABETALOL HCL 5 MG/ML IV SOLN
10.0000 mg | INTRAVENOUS | Status: AC | PRN
  Administered 2021-10-31 – 2021-11-01 (×10): 20 mg via INTRAVENOUS
  Filled 2021-10-31 (×9): qty 4

## 2021-10-31 MED ORDER — IPRATROPIUM-ALBUTEROL 0.5-2.5 (3) MG/3ML IN SOLN: 3.0000 mL | RESPIRATORY_TRACT | Status: DC | PRN

## 2021-10-31 MED ORDER — INSULIN DETEMIR 100 UNIT/ML ~~LOC~~ SOLN
6.0000 [IU] | Freq: Two times a day (BID) | SUBCUTANEOUS | Status: DC
  Administered 2021-10-31 – 2021-11-06 (×14): 6 [IU] via SUBCUTANEOUS
  Filled 2021-10-31 (×16): qty 0.06

## 2021-10-31 MED ORDER — IPRATROPIUM-ALBUTEROL 0.5-2.5 (3) MG/3ML IN SOLN
3.0000 mL | Freq: Three times a day (TID) | RESPIRATORY_TRACT | Status: DC
  Administered 2021-11-01 – 2021-11-04 (×12): 3 mL via RESPIRATORY_TRACT
  Filled 2021-10-31 (×12): qty 3

## 2021-10-31 MED ORDER — CLONIDINE HCL 0.1 MG PO TABS
0.3000 mg | ORAL_TABLET | Freq: Three times a day (TID) | ORAL | Status: DC
  Administered 2021-10-31 – 2021-11-04 (×13): 0.3 mg
  Filled 2021-10-31 (×13): qty 1

## 2021-10-31 MED ORDER — POTASSIUM CHLORIDE 10 MEQ/100ML IV SOLN
10.0000 meq | INTRAVENOUS | Status: AC
  Administered 2021-10-31 (×4): 10 meq via INTRAVENOUS
  Filled 2021-10-31 (×5): qty 100

## 2021-10-31 NOTE — Progress Notes (Signed)
[facility] Progress Note Patient Name: Jeffrey Mullen DOB: 06-Jan-1951 MRN: [id number]   Date of Service  10/31/2021  HPI/Events of Note  Patient's blood pressure still elevated at 190/56, he also spiked a temp up to 102 despite Tylenol given.  eICU Interventions  Labetalol ordered for blood pressure, cooling blanket ordered, will also order a set of blood cultures,  and respiratory cultures.        Kerry Kass Cohl Behrens 10/31/2021, 12:43 AM

## 2021-10-31 NOTE — Progress Notes (Signed)
Nutrition Follow-up  DOCUMENTATION CODES:   Not applicable  INTERVENTION:    Tube feeding via OG tube: Vital 1.5 at 55 ml/h (1320 ml per day) Prosource TF 90 ml BID  Provides 2140 kcal, 133 gm protein, 1003 ml free water daily  MVI with minerals via tube   NUTRITION DIAGNOSIS:   Inadequate oral intake related to inability to eat as evidenced by NPO status. Ongoing.   GOAL:   Patient will meet greater than or equal to 90% of their needs Met with TF.   MONITOR:   Vent status, Labs, Weight trends, TF tolerance, Skin, I & O's  REASON FOR ASSESSMENT:   Ventilator    ASSESSMENT:   Pt with PMH of hepatitis C, alcohol abuse and seizures admitted with with status epilepticus.  Pt discussed during ICU rounds and with RN. Continuous EEG now off, increased secretions noted. Hyperkalemia resolved and now low and being repleted.   Patient is currently intubated on ventilator support MV: 16.1 L/min Temp (24hrs), Avg:99.6 F (37.6 C), Min:97.5 F (36.4 C), Max:103.1 F (39.5 C)  Propofol: 14.3 ml/hr provides: 377 kcal  Medications reviewed and include: folic acid, SSI, novolog, levemir, MVI with minerals, protonix, phenobarbital, thiamine Precedex KCl x 4  Kphos x 1  Labs reviewed: K 2.9, PO4: 2.2 CBG's: 118-235  16 F OG tube; tip distal stomach per xray  UOP: 8300 ml  Stool: 425 ml  I&O: + 9.8 L  Current TF: Nepro @ 50 ml/hr with 90 ml ProSource TF BID Provides: 2320 kcal and 141 grams protein   Diet Order:   Diet Order             Diet NPO time specified  Diet effective now                   EDUCATION NEEDS:   Not appropriate for education at this time  Skin:  Skin Assessment: Skin Integrity Issues: Skin Integrity Issues:: DTI (MASD: buttocks, open blisters: L arm) DTI: buttocks, R/L heels  Last BM:  425 ml  Height:   Ht Readings from Last 1 Encounters:  10/27/21 5' 10" (1.778 m)    Weight:   Wt Readings from Last 1 Encounters:   10/31/21 80 kg    Ideal Body Weight:  75.5 kg  BMI:  Body mass index is 25.31 kg/m.  Estimated Nutritional Needs:   Kcal:  2100-2300  Protein:  120-140 grams  Fluid:  > 2 /day  Lockie Pares., RD, LDN, CNSC See AMiON for contact information

## 2021-10-31 NOTE — Progress Notes (Signed)
RN trying to obtain IV access. CPT unable to be done at this time.

## 2021-10-31 NOTE — Progress Notes (Signed)
LTM EEG discontinued - no skin breakdown at unhook.   

## 2021-10-31 NOTE — Progress Notes (Signed)
[facility] Progress Note  Subjective:  remains in ICU.  He had 8.3 liters UOP over 11/7 charted.  They were not able to get labs this am and team is getting access for labs.  They do confirm this output - spoke with critical care MD.   Review of systems: intubated and sedated - not able to obtain     Vitals:   10/31/21 0400 10/31/21 0500 10/31/21 0600 10/31/21 0700  BP: (!) 149/57 (!) 142/57 (!) 139/56 (!) 142/53  Pulse: 89 83 79 78  Resp: (!) 27 (!) 26 (!) 26 (!) 26  Temp: 99.1 F (37.3 C) 97.9 F (36.6 C) 99.1 F (37.3 C)   TempSrc: Esophageal  Oral   SpO2: 96% 97% 96% 96%  Weight:  80 kg    Height:        Physical Exam:  General adult male in bed intubated HEENT normocephalic atraumatic getting EEG monitoring Neck trachea midline Lungs coarse mechanical breath sounds; FIO2 40 and PEEP 8 Heart S1S2 no rub Abdomen soft nontender distended; scrotal edema improved Extremities 1-2+edema; left hand and arm discoloration  Neuro - on continuous sedation      Home meds include - norvasc, asa, lipitor, buspar, advil, lisinopril-hctz, vit/ supps/ prn's/ eye gtts per charting         Hypotension on pressor support 11/1 - 11/4 Total I/O = 27.4 L in and 9.2 L out = +18 L    CTA head/ neck on 10/31 = 100 cc IV contrast   MRI brain on 11/01 w/ 8 cc Gadavist    CXR 11/5 - small bilat effusions, stable moderate bibasilar atelectasis    Renal US 11/3 > 10- 12 cm kidneys w/o hydro or ^echotexture        UA 10/31 - negative    UA 11/3 - negative    UA 11/4- negative    UNa < 10, UCr 193.3    Assessment/ Plan: Acute renal failure - admit creat 1.0, then AKI w/ creat up to 2.1 on 11/5. Likely ATN due to IV contrast injury + hypoperfusion/ hypotension which required pressor support for 3-4 days. Less likely acyclovir renal injury. UA's all normal, renal US w/o obstruction. BP's now normal, sig +vol overloaded on exam and +17 L since admit.  May have had a component of  rhabdo - CK elevated.  S/p profound diuresis with lasix  Pause lasix for today with charted robust response (8+ liters charted over 11/7); reassess volume status daily  Improving with supportive care - continue same  Obtain renal panel now and daily - will follow up today's labs Hyperkalemia - low K+ Nepro tube feeds and s/p medical measures; resolved. Await today's labs Status epilepticus - AED regimen per neurology PNA - antibiotics per primary team - ceftriaxone and s/p azithro Fluid Volume overload - improving; diuretics as above H/o HTN - optimize volume with diuretics as above Normocytic anemia - CBC is ordered and had been slightly improving    Recent Labs  Lab 10/29/21 0443 10/29/21 0848 10/29/21 1142 10/30/21 0239  K 6.3* 5.0 4.9 4.5  BUN 43*  --  43* 55*  CREATININE 2.91*  --  2.61* 2.35*  CALCIUM 8.1*  --  7.5* 8.0*  PHOS 4.9*  --   --  4.2  HGB  --  8.8*  --  9.2*   Inpatient medications:  chlorhexidine gluconate (MEDLINE KIT)  15 mL Mouth Rinse BID   Chlorhexidine Gluconate Cloth  6 each  Topical Daily   feeding supplement (PROSource TF)  90 mL Per Tube BID   folic acid  1 mg Per Tube Daily   furosemide  80 mg Intravenous Q12H   heparin  5,000 Units Subcutaneous Q8H   insulin aspart  0-15 Units Subcutaneous Q4H   insulin aspart  4 Units Subcutaneous Q4H   ipratropium-albuterol  3 mL Nebulization QID   mouth rinse  15 mL Mouth Rinse 10 times per day   multivitamin with minerals  1 tablet Per Tube Daily   pantoprazole sodium  40 mg Per Tube Daily   PHENObarbital  65 mg Intravenous BID   [START ON 11/01/2021] thiamine injection  100 mg Intravenous Daily    sodium chloride 250 mL (10/29/21 1207)   sodium chloride Stopped (10/30/21 0847)   cefTRIAXone (ROCEPHIN)  IV Stopped (10/30/21 1125)   dexmedetomidine (PRECEDEX) IV infusion 0.7 mcg/kg/hr (10/31/21 0700)   feeding supplement (NEPRO CARB STEADY) 1,000 mL (10/30/21 0907)   lacosamide (VIMPAT) IV Stopped  (10/30/21 2140)   levETIRAcetam Stopped (10/31/21 0005)   thiamine injection Stopped (10/30/21 1259)   sodium chloride, acetaminophen (TYLENOL) oral liquid 160 mg/5 mL, docusate, fentaNYL (SUBLIMAZE) injection, fentaNYL (SUBLIMAZE) injection, hydrALAZINE, labetalol, polyethylene glycol   Claudia Desanctis, MD 10/31/2021 7:57 AM

## 2021-10-31 NOTE — Procedures (Addendum)
MRN: [id number]  Epilepsy Attending: Lora Havens  Referring Physician/Provider: Dr Rosalin Hawking Duration: 10/30/2021 1846 to 10/31/2021 1107   Patient history: 70 y.o. male with PMH of HTN, PVD, congenital glaucoma, cataract, smoker, left frontal and occipital traumatic contusion with small left frontal SDH in 2019 presented to ED for right gaze and left sided weakness with questionable seizure activity. EEG to evaluate for seizure   Level of alertness: comatose   AEDs during EEG study: LEV,LCM, Phenobarb   Technical aspects: This EEG study was done with scalp electrodes positioned according to the 10-20 International system of electrode placement. Electrical activity was acquired at a sampling rate of 500Hz  and reviewed with a high frequency filter of 70Hz  and a low frequency filter of 1Hz . EEG data were recorded continuously and digitally stored.    Description: EEG showed low amplitude 5 to 6 Hz theta slowing lasting 2 to 3 seconds alternating with 1 to 2 seconds of high amplitude sharply contoured 2 to 3 Hz delta slowing.  Hyperventilation and photic stimulation were not performed.      ABNORMALITY -Continuous slow, generalized   IMPRESSION: This study is suggestive of severe diffuse encephalopathy, nonspecific etiology but likely related to sedation. No seizures or definite epileptiform discharges were seen during this study.    Zana Biancardi [name]

## 2021-10-31 NOTE — Progress Notes (Signed)
NAME:  Jeffrey Mullen, MRN:  [id number], DOB:  28-Feb-1951, LOS: 8 ADMISSION DATE:  10/23/2021, CONSULTATION DATE:  10/31 REFERRING MD:  Dr. Dina Rich, CHIEF COMPLAINT:  AMS   History of Present Illness:  70 y/o M who presented to [facility] ER on 10/31 via EMS with reports of altered mental status.  Given  right sided gaze, altered and left sided weakness, code stoke called. The patient was taken emergently to CT for imaging which was negative for LVO.  While in the [place], he had a witnessed seizure.    PCCM called for ICU admission.   Chart review shows he was seen in the ER on 10/4 after passing out at the [employer].  Physical exam was negative at the time.  Labs negative.  No further imaging obtained.   Pertinent  Medical History  Alcohol Use - unclear quantity Anxiety / Depression  Congenital blindness of left eye, cataract on R Hepatitis C+, treated in 2016 Seizures   Significant Hospital Events: Including procedures, antibiotic start and stop dates in addition to other pertinent events   10/31 Admit with AMS, seizure, hypertensive emergency, AKI 11/1 MRI brain no acute intracranial process. No etiology is seen for the patient's seizure. 11/02 bedside LP attempted but unsuccessful  11/03  No seizures seen on EEG overnight  11/04 weaning sedation to assess for seizure reoccurrence   Interim History / Subjective:  Patient spiked high-grade fever with T-max 103 Not tolerating spontaneous breathing trial due to tachypnea and tachycardia Making good amount of urine with improvement in serum creatinine  Objective   Blood pressure (!) 192/63, pulse (!) 105, temperature 100.1 F (37.8 C), temperature source Axillary, resp. rate (!) 34, height 5\' 10"  (1.778 m), weight 80 kg, SpO2 98 %.    Vent Mode: PRVC FiO2 (%):  [40 %] 40 % Set Rate:  [26 bmp] 26 bmp Vt Set:  [440 mL] 440 mL PEEP:  [8 cmH20] 8 cmH20 Plateau Pressure:  [15 cmH20-20 cmH20] 18 cmH20   Intake/Output Summary  (Last 24 hours) at 10/31/2021 1536 Last data filed at 10/31/2021 1440 Gross per 24 hour  Intake 2522.67 ml  Output 8395 ml  Net -5872.33 ml   Filed Weights   10/26/21 0500 10/27/21 0500 10/31/21 0500  Weight: 80 kg 80.3 kg 80 kg    Examination: General: Critically ill appearing male, intubated/sedated HEENT: Atraumatic, normocephalic ETT, MM pink/moist, PRRL on right, cataract on left Neuro: Eyes closed, not following commands, right pupil reactive to light.  Positive cough and gag CV: s1s2 regular rate and rhythm, no murmur, rubs, or gallops PULM: Bilateral rhonchorous sounds, no wheezing or crackles GI: soft, bowel sounds active in all 4 quadrants,  non-distended Extremities: warm/dry, diffuse 2+ edema upper and lower extremities Skin: skin tear left dorsal service, extravasation injury to dorsal side of left hand and arm.  Pressure injuries noted on sacrum   Resolved Hospital Problem list   Hyperkalemia  Assessment & Plan:  Status epilepticus, improved Alcohol abuse with prior SDH No seizure seen on EEG since 11/3 LVO ruled out on CT.  UDS neg.  MRI benign.  EEG was discontinued Appreciate neurology recommendations Seizure precautions  Continue lacosamide, Keppra and phenobarbital He is off propofol, on low-dose Precedex Aspirations precautions  Continue high dose thiamine  meningitis and encephalitis was ruled out with negative LP  Acute Hypoxic Respiratory Failure  RLL Pneumonia, Haemophilus influenzae Continue ventilator support with lung protective strategies  Titrate FiO2 and PEEP with O2  sat goal 92% Patient having thick copious amount of secretions via ET tube Spiked fever with T-max 103 Even though respiratory culture grew haemophilus influenza, he has been on ceftriaxone but continued to spike fevers Antibiotics changed to Unasyn Repeat respiratory culture is growing gram-positive cocci VAP bundle in place  PAD protocol  AKI due to contrast-induced  ATN Nontraumatic rhabdomyolysis Non-anion Gap Metabolic Acidosis Hypokalemia/hyponatremia due to aggressive diuresis  Hypophosphatemia Baseline sr cr ~0.9 to 1  Patient is making lots of amount of urine, he made 7.6 L urine in last 24 hours Serum creatinine improved to 1.3 Appreciate renal follow-up Lasix was stopped, started on half normal saline Stop diuretics Trend Bmet Avoid nephrotoxins Ensure adequate renal perfusion  Renal following, no plan for CRRT yet CKs trending down  Prediabetes Patient hemoglobin A1c 6.0 Continue SSI q4 4 units q4hrs for TF coverage  Vasopressor Extravasation Skin Injury - left hand/arm Left Arm Skin Tear Unstageable sacral ulcer (POA) Appreciate wound care follow-up S/p phentolamine and nitroglycerin application Change posterior every 2 hours Follow-up wound care recommendations  Uncontrolled hypertension Patient blood pressure remains uncontrolled Started on clonidine 0.3 mg 3 times daily and amlodipine 10 mg once daily Continue IV hydralazine and labetalol as needed   Complex social dynamics -May need help figuring out POA  Best Practice (right click and "Reselect all SmartList Selections" daily)   Diet/type: tubefeeds DVT prophylaxis: prophylactic heparin  GI prophylaxis: PPI Lines: N/A Foley:  foley in place Code Status:  full code Last date of multidisciplinary goals of care discussion - pending, complex family dynamics    Total critical care time: 41 minutes  Performed by: Jacky Kindle   Critical care time was exclusive of separately billable procedures and treating other patients.   Critical care was necessary to treat or prevent imminent or life-threatening deterioration.   Critical care was time spent personally by me on the following activities: development of treatment plan with patient and/or surrogate as well as nursing, discussions with consultants, evaluation of patient's response to treatment, examination of  patient, obtaining history from patient or surrogate, ordering and performing treatments and interventions, ordering and review of laboratory studies, ordering and review of radiographic studies, pulse oximetry and re-evaluation of patient's condition.   Jacky Kindle MD [facility] See Amion for pager If no response to pager, please call [phone] until 7pm After 7pm, Please call E-link [phone]

## 2021-10-31 NOTE — Progress Notes (Signed)
Subjective: Was febrile overnight.  No clinical seizures.   ROS: Unable to obtain due to poor mental status  Examination  Vital signs in last 24 hours: Temp:  [97.5 F (36.4 C)-103.1 F (39.5 C)] 100.1 F (37.8 C) (11/08 1200) Pulse Rate:  [75-111] 105 (11/08 1400) Resp:  [21-36] 32 (11/08 1400) BP: (121-213)/(45-78) 187/65 (11/08 1400) SpO2:  [92 %-100 %] 96 % (11/08 1400) FiO2 (%):  [40 %] 40 % (11/08 1200) Weight:  [80 kg] 80 kg (11/08 0500)    General: lying in bed, NAD CVS: pulse-normal rate and rhythm RS: intubated, coarse breath sound BL Extremities: warm, +edema Neuro: comatose ( on precedex), doesn't open eyes to noxious stimuli, left corneal opacity, right pupil reactive to light, right corneal reflex intact, gag reflex intact, doesn't withdraw to noxious stimuli in all extremities    Basic Metabolic Panel: Recent Labs  Lab 10/26/21 0356 10/26/21 1646 10/27/21 0502 10/27/21 1639 10/28/21 0916 10/28/21 1017 10/29/21 0443 10/29/21 0848 10/29/21 1142 10/30/21 0239 10/31/21 0851  NA 142  --  142   < > 137   < > 140 140 141 141 149*  147*  K 4.2  --  3.3*   < > 5.4*   < > 6.3* 5.0 4.9 4.5 3.0*  2.9*  CL 115*  --  115*   < > 109  --  113*  --  114* 113* 113*  111  CO2 21*  --  17*   < > 18*  --  17*  --  17* 18* 25  24  GLUCOSE 114*  --  128*   < > 231*  --  133*  --  139* 168* 283*  281*  BUN 8  --  11   < > 29*  --  43*  --  43* 55* 45*  49*  CREATININE 1.52*  --  1.59*   < > 2.39*  --  2.91*  --  2.61* 2.35* 1.28*  1.30*  CALCIUM 7.5*  --  7.7*   < > 7.5*  --  8.1*  --  7.5* 8.0* 9.0  8.8*  MG 1.8 2.3 1.5*  --  2.1  --   --   --   --   --  2.0  PHOS 5.0* 4.1 3.1  --   --   --  4.9*  --   --  4.2 2.2*   < > = values in this interval not displayed.    CBC: Recent Labs  Lab 10/27/21 0502 10/27/21 0811 10/27/21 1639 10/28/21 0916 10/28/21 1017 10/29/21 0848 10/30/21 0239 10/31/21 0851  WBC 5.8 7.3  --  13.7*  --   --  17.6* 12.3*  NEUTROABS   --  4.6  --   --   --   --   --   --   HGB 10.5* 10.8*   < > 8.8* 8.5* 8.8* 9.2* 8.9*  HCT 33.3* 34.4*   < > 28.0* 25.0* 26.0* 27.3* 26.5*  MCV 87.6 88.7  --  86.4  --   --  84.0 83.3  PLT 172 187  --  142*  --   --  156 167   < > = values in this interval not displayed.     Coagulation Studies: No results for input(s): LABPROT, INR in the last 72 hours.  Imaging No new brain imaging overnight  ASSESSMENT AND PLAN:  70 year old male with history of hypertension, peripheral vascular disease, cataract, tobacco abuse, left  frontal and occipital traumatic contusion with small left frontal SDH in 2019, history of alcohol abuse who presented with focal seizures which have since resolved.   New onset seizures Sacral ulcer Fever Acute encephalopathy ( post-ictal, medications, infectious) -Unclear etiology. Ethanol level was negative on arrival, does have sacral ulcer and possible pneumonia which could be the provoking factors -Infectious work-up ordered by critical care team  Recommendations -We will reduce phenobarbital to 65 mg once daily to minimize sedation -Continue Vimpat 200 mg twice daily, Keppra 500 mg twice daily (renal dosing). Will check phenobabr level tomorrow.  -DC LTM as patient remains seizure-free - if any seizure overnight, can increase phenobarb to 65 mg BID -Management of rest of comorbidities per primary team -Continue seizure precautions -PRN IV Ativan 2 mg clinical seizure-like activity   I have spent a total of 36 minutes with the patient reviewing hospital notes,  test results, labs and examining the patient as well as establishing an assessment and plan that was discussed personally with Dr Tacy Learn.  > 50% of time was spent in direct patient care.    Zeb Comfort Epilepsy [facility] For questions after 5pm please refer to AMION to reach the Neurologist on call

## 2021-11-01 ENCOUNTER — Inpatient Hospital Stay (HOSPITAL_COMMUNITY): Payer: Medicare Other

## 2021-11-01 DIAGNOSIS — N179 Acute kidney failure, unspecified: Secondary | ICD-10-CM | POA: Diagnosis not present

## 2021-11-01 DIAGNOSIS — G40901 Epilepsy, unspecified, not intractable, with status epilepticus: Secondary | ICD-10-CM | POA: Diagnosis not present

## 2021-11-01 DIAGNOSIS — J14 Pneumonia due to Hemophilus influenzae: Secondary | ICD-10-CM | POA: Diagnosis not present

## 2021-11-01 DIAGNOSIS — J9601 Acute respiratory failure with hypoxia: Secondary | ICD-10-CM | POA: Diagnosis not present

## 2021-11-01 LAB — CBC
HCT: 28.3 % — ABNORMAL LOW (ref 39.0–52.0)
Hemoglobin: 9.6 g/dL — ABNORMAL LOW (ref 13.0–17.0)
MCH: 27.7 pg (ref 26.0–34.0)
MCHC: 33.9 g/dL (ref 30.0–36.0)
MCV: 81.8 fL (ref 80.0–100.0)
Platelets: 179 10*3/uL (ref 150–400)
RBC: 3.46 MIL/uL — ABNORMAL LOW (ref 4.22–5.81)
RDW: 15.6 % — ABNORMAL HIGH (ref 11.5–15.5)
WBC: 12.6 10*3/uL — ABNORMAL HIGH (ref 4.0–10.5)
nRBC: 0.3 % — ABNORMAL HIGH (ref 0.0–0.2)

## 2021-11-01 LAB — BASIC METABOLIC PANEL
Anion gap: 11 (ref 5–15)
BUN: 42 mg/dL — ABNORMAL HIGH (ref 8–23)
CO2: 25 mmol/L (ref 22–32)
Calcium: 8.2 mg/dL — ABNORMAL LOW (ref 8.9–10.3)
Chloride: 115 mmol/L — ABNORMAL HIGH (ref 98–111)
Creatinine, Ser: 1.15 mg/dL (ref 0.61–1.24)
GFR, Estimated: >60 mL/min (ref >60)
Glucose, Bld: 179 mg/dL — ABNORMAL HIGH (ref 70–99)
Potassium: 3.3 mmol/L — ABNORMAL LOW (ref 3.5–5.1)
Sodium: 151 mmol/L — ABNORMAL HIGH (ref 135–145)

## 2021-11-01 LAB — GLUCOSE, CAPILLARY
Glucose-Capillary: 124 mg/dL — ABNORMAL HIGH (ref 70–99)
Glucose-Capillary: 103 mg/dL — ABNORMAL HIGH (ref 70–99)
Glucose-Capillary: 175 mg/dL — ABNORMAL HIGH (ref 70–99)
Glucose-Capillary: 134 mg/dL — ABNORMAL HIGH (ref 70–99)
Glucose-Capillary: 130 mg/dL — ABNORMAL HIGH (ref 70–99)
Glucose-Capillary: 114 mg/dL — ABNORMAL HIGH (ref 70–99)

## 2021-11-01 LAB — RENAL FUNCTION PANEL
Albumin: 1.8 g/dL — ABNORMAL LOW (ref 3.5–5.0)
Anion gap: 12 (ref 5–15)
BUN: 43 mg/dL — ABNORMAL HIGH (ref 8–23)
CO2: 24 mmol/L (ref 22–32)
Calcium: 9 mg/dL (ref 8.9–10.3)
Chloride: 119 mmol/L — ABNORMAL HIGH (ref 98–111)
Creatinine, Ser: 1.02 mg/dL (ref 0.61–1.24)
GFR, Estimated: >60 mL/min (ref >60)
Glucose, Bld: 107 mg/dL — ABNORMAL HIGH (ref 70–99)
Phosphorus: 2.8 mg/dL (ref 2.5–4.6)
Potassium: 4.1 mmol/L (ref 3.5–5.1)
Sodium: 155 mmol/L — ABNORMAL HIGH (ref 135–145)

## 2021-11-01 LAB — MAGNESIUM: Magnesium: 2.4 mg/dL (ref 1.7–2.4)

## 2021-11-01 MED ORDER — ETOMIDATE 2 MG/ML IV SOLN: 20.0000 mg | Freq: Once | INTRAVENOUS | Status: AC

## 2021-11-01 MED ORDER — POTASSIUM CHLORIDE 20 MEQ PO PACK
40.0000 meq | PACK | Freq: Once | ORAL | Status: AC
  Administered 2021-11-01: 40 meq
  Filled 2021-11-01: qty 2

## 2021-11-01 MED ORDER — ROCURONIUM BROMIDE 50 MG/5ML IV SOLN: 100.0000 mg | Freq: Once | INTRAVENOUS | Status: AC

## 2021-11-01 MED ORDER — BETHANECHOL CHLORIDE 10 MG PO TABS
10.0000 mg | ORAL_TABLET | Freq: Three times a day (TID) | ORAL | Status: DC
  Administered 2021-11-01 – 2021-11-06 (×16): 10 mg
  Filled 2021-11-01 (×16): qty 1

## 2021-11-01 MED ORDER — LABETALOL HCL 5 MG/ML IV SOLN
10.0000 mg | INTRAVENOUS | Status: DC | PRN
  Administered 2021-11-01 – 2021-11-16 (×4): 20 mg via INTRAVENOUS
  Filled 2021-11-01 (×6): qty 4

## 2021-11-01 MED ORDER — ROCURONIUM BROMIDE 10 MG/ML (PF) SYRINGE
PREFILLED_SYRINGE | INTRAVENOUS | Status: AC
  Administered 2021-11-01: 100 mg via INTRAVENOUS
  Filled 2021-11-01: qty 10

## 2021-11-01 MED ORDER — DEXTROSE 5 % IV SOLN: INTRAVENOUS | Status: DC

## 2021-11-01 MED ORDER — LISINOPRIL 20 MG PO TABS
40.0000 mg | ORAL_TABLET | Freq: Every day | ORAL | Status: DC
  Administered 2021-11-01 – 2021-11-06 (×6): 40 mg
  Filled 2021-11-01 (×6): qty 2

## 2021-11-01 MED ORDER — ETOMIDATE 2 MG/ML IV SOLN
INTRAVENOUS | Status: AC
  Administered 2021-11-01: 20 mg via INTRAVENOUS
  Filled 2021-11-01: qty 10

## 2021-11-01 NOTE — Procedures (Signed)
Bronchoscopy Procedure Note  Jeffrey Mullen  [id number]  [birth year]  Date:11/01/21  Time:2:40 PM   Provider Performing:Baleria Wyman   Procedure(s):  Flexible bronchoscopy with bronchial alveolar lavage (10932)  Indication(s) Acute respiratory failure with Pneumonia  Consent Risks of the procedure as well as the alternatives and risks of each were explained to the patient and/or caregiver.  Consent for the procedure was obtained and is signed in the bedside chart  Anesthesia Etomidate and Rocuronium   Time Out Verified patient identification, verified procedure, site/side was marked, verified correct patient position, special equipment/implants available, medications/allergies/relevant history reviewed, required imaging and test results available.   Sterile Technique Usual hand hygiene, masks, gowns, and gloves were used   Procedure Description Bronchoscope advanced through endotracheal tube and into airway.  Airways were examined down to subsegmental level with findings noted below.   Following diagnostic evaluation, BAL(s) performed in LLL with normal saline and return of 5cc fluid  Findings: Erythematous mucosa with narrow airway, think secretions noted in LLL, BAL was performed and sent for culture   Complications/Tolerance None; patient tolerated the procedure well. Chest X-ray is needed post procedure.   EBL Minimal   Specimen(s) Sent to labs

## 2021-11-01 NOTE — Progress Notes (Signed)
2 phlebotomists attempted to draw AM labs. Unsuccessful as patient is edematous and all they could obtain was serous fluid from R arm. Will make day shift rounding MD aware.

## 2021-11-01 NOTE — Progress Notes (Signed)
NAME:  Jeffrey Mullen, MRN:  [id number], DOB:  09-26-1951, LOS: 9 ADMISSION DATE:  10/23/2021, CONSULTATION DATE:  10/31 REFERRING MD:  Dr. Dina Rich, CHIEF COMPLAINT:  AMS   History of Present Illness:  70 y/o M who presented to [facility] ER on 10/31 via EMS with reports of altered mental status.  Given  right sided gaze, altered and left sided weakness, code stoke called. The patient was taken emergently to CT for imaging which was negative for LVO.  While in the [place], he had a witnessed seizure.    PCCM called for ICU admission.   Chart review shows he was seen in the ER on 10/4 after passing out at the [employer].  Physical exam was negative at the time.  Labs negative.  No further imaging obtained.   Pertinent  Medical History  Alcohol Use - unclear quantity Anxiety / Depression  Congenital blindness of left eye, cataract on R Hepatitis C+, treated in 2016 Seizures   Significant Hospital Events: Including procedures, antibiotic start and stop dates in addition to other pertinent events   10/31 Admit with AMS, seizure, hypertensive emergency, AKI 11/1 MRI brain no acute intracranial process. No etiology is seen for the patient's seizure. 11/02 bedside LP attempted but unsuccessful  11/03  No seizures seen on EEG overnight  11/04 weaning sedation to assess for seizure reoccurrence   Interim History / Subjective:  Patient continued to spike fever but T-max has decreased to 101 after switching antibiotics  Not tolerating spontaneous breathing trial due to tachypnea and tachycardia Serum creatinine continue to improve  Objective   Blood pressure (!) 178/72, pulse 98, temperature (!) 100.9 F (38.3 C), temperature source Oral, resp. rate (!) 33, height 5\' 10"  (1.778 m), weight 87.6 kg, SpO2 93 %.    Vent Mode: PRVC FiO2 (%):  [40 %] 40 % Set Rate:  [26 bmp] 26 bmp Vt Set:  [440 mL] 440 mL PEEP:  [5 cmH20-8 cmH20] 8 cmH20 Pressure Support:  [8 cmH20] 8 cmH20 Plateau  Pressure:  [19 cmH20-20 cmH20] 20 cmH20   Intake/Output Summary (Last 24 hours) at 11/01/2021 1350 Last data filed at 11/01/2021 1000 Gross per 24 hour  Intake 4830.68 ml  Output 2670 ml  Net 2160.68 ml   Filed Weights   10/27/21 0500 10/31/21 0500 11/01/21 0500  Weight: 80.3 kg 80 kg 87.6 kg    Examination: General: Critically ill appearing middle-aged African-American male, intubated HEENT: Atraumatic, normocephalic ETT, MM pink/moist, PRRL on right, cataract on left Neuro: Eyes closed, not following commands, right pupil reactive to light.  Positive cough and gag CV: Tachycardic, regular rhythm, no murmur, rubs, or gallops PULM: Bilateral rhonchorous sounds, no wheezing or crackles.  ET tube with thick greenish secretions GI: soft, bowel sounds active in all 4 quadrants,  non-distended Extremities: warm/dry, diffuse 2+ edema upper and lower extremities Skin: skin tear left dorsal service, extravasation injury to dorsal side of left hand and arm.  Pressure injuries noted on sacrum   Resolved Hospital Problem list   Hyperkalemia  Assessment & Plan:  Status epilepticus, improved Alcohol abuse with prior SDH No seizure seen on EEG since 11/3 EEG was discontinued Appreciate neurology recommendations Seizure precautions  Continue lacosamide, Keppra and phenobarbital He is off sedation, not waking up Aspirations precautions  Continue high dose thiamine  meningitis and encephalitis was ruled out with negative LP  Acute Hypoxic Respiratory Failure  RLL Pneumonia, Haemophilus influenzae Continue ventilator support with lung protective strategies  Titrate FiO2 and PEEP with O2 sat goal 92% Currently at 40% FiO2 and 5 of PEEP Patient having thick copious amount of secretions via ET tube Continued to spike fever but T-max has decreased after changing antibiotic to Unasyn VAP bundle in place  PAD protocol Not able to tolerate pressure support trial, likely will need  tracheostomy  AKI due to contrast-induced ATN Nontraumatic rhabdomyolysis Non-anion Gap Metabolic Acidosis Hypokalemia/hypernatremia due to aggressive diuresis  Hypophosphatemia Baseline sr cr ~0.9 to 1  Patient is in diuretic phase of ATN Serum creatinine has improved Appreciate nephrology input Serum sodium has trended up to 155 Started on D5W infusion Half-normal saline was stopped Continue to hold diuretics Trend Bmet Avoid nephrotoxins Ensure adequate renal perfusion  CKs trending down Hypokalemia, hypophosphatemia have resolved  Prediabetes Patient hemoglobin A1c 6.0 Continue SSI q4 4 units q4hrs for TF coverage  Vasopressor Extravasation Skin Injury - left hand/arm Left Arm Skin Tear Unstageable sacral ulcer (POA) Appreciate wound care follow-up S/p phentolamine and nitroglycerin application Change posterior every 2 hours Follow-up wound care recommendations  Uncontrolled hypertension Patient blood pressure remains uncontrolled Continue clonidine 0.3 mg 3 times daily and amlodipine 10 mg once daily Started on lisinopril 40 mg once daily Continue IV hydralazine and labetalol as needed   Complex social dynamics Patient's daughter does not want to be decision-maker for him, she made her mother to be the primary decision maker. Updated Lisa patient's Ex- significant other, consent obtained for bronchoscopy We will give him a trial of extubation tomorrow, if he fails then will proceed with tracheostomy  Best Practice (right click and "Reselect all SmartList Selections" daily)   Diet/type: tubefeeds DVT prophylaxis: prophylactic heparin  GI prophylaxis: PPI Lines: N/A Foley:  foley in place Code Status:  full code    Total critical care time: 38 minutes  Performed by: [place] care time was exclusive of separately billable procedures and treating other patients.   Critical care was necessary to treat or prevent imminent or life-threatening  deterioration.   Critical care was time spent personally by me on the following activities: development of treatment plan with patient and/or surrogate as well as nursing, discussions with consultants, evaluation of patient's response to treatment, examination of patient, obtaining history from patient or surrogate, ordering and performing treatments and interventions, ordering and review of laboratory studies, ordering and review of radiographic studies, pulse oximetry and re-evaluation of patient's condition.   Jacky Kindle MD [facility] See Amion for pager If no response to pager, please call [phone] until 7pm After 7pm, Please call E-link [phone]

## 2021-11-01 NOTE — Progress Notes (Addendum)
[facility] Progress Note  Subjective:  remains in ICU.  They were not able to get afternoon labs yesterday and two phlebotomists attempted this am but were unsuccessful.  Did ultimately have blood work.  He had 3.2 liters UOP over 11/8 charted.    Review of systems: intubated and sedated - not able to obtain     Vitals:   11/01/21 0500 11/01/21 0600 11/01/21 0700 11/01/21 0738  BP: (!) 167/55 (!) 188/72 (!) 160/59 (!) 160/59  Pulse: 86 93 88 87  Resp: (!) 23 (!) 30 (!) 29 (!) 26  Temp:  99 F (37.2 C)    TempSrc:  Oral    SpO2: 96% 95% 94% 97%  Weight: 87.6 kg     Height:        Physical Exam:  General adult male in bed intubated HEENT normocephalic atraumatic Neck trachea midline Lungs coarse mechanical breath sounds; FIO2 40 and PEEP 8 Heart S1S2 no rub Abdomen soft nontender distended; scrotal edema  Extremities 1-2+edema Neuro - on continuous sedation      Home meds include - norvasc, asa, lipitor, buspar, advil, lisinopril-hctz, vit/ supps/ prn's/ eye gtts per charting         Hypotension on pressor support 11/1 - 11/4 Total I/O = 27.4 L in and 9.2 L out = +18 L    CTA head/ neck on 10/31 = 100 cc IV contrast   MRI brain on 11/01 w/ 8 cc Gadavist    CXR 11/5 - small bilat effusions, stable moderate bibasilar atelectasis    Renal US 11/3 > 10- 12 cm kidneys w/o hydro or ^echotexture        UA 10/31 - negative    UA 11/3 - negative    UA 11/4- negative    UNa < 10, UCr 193.3    Assessment/ Plan: Acute renal failure - admit creat 1.0, then AKI w/ creat up to 2.1 on 11/5. Likely ATN due to IV contrast injury + hypoperfusion/ hypotension which required pressor support for 3-4 days. Less likely acyclovir renal injury. UA's all normal, renal US w/o obstruction. BP's now normal, sig +vol overloaded on exam and +17 L since admit.  May have had a component of rhabdo - CK elevated.  S/p profound diuresis with lasix  Holding lasix again today with charted  robust response (8+ liters charted over 11/7 and still over 3 liters without lasix on 11/8;  reassess volume status daily and anticipate adding back lasix soon if not tomorrow Resolved with supportive care - continue same   Hypernatremia - on 1/2 NS - will transition to D5W.  BMP at 2pm Hyperkalemia - low K+ Nepro tube feeds and s/p medical measures; resolved. recently with hypokalemia and is s/p repletion.  Now on regular tube feeds.  Resolved.  K now normal Status epilepticus - AED regimen per neurology PNA - antibiotics per primary team - s/p ceftriaxone and azithro; now on unasyn Fluid Volume overload - improving; diuretics as above H/o HTN - optimize volume as above. Improved a bit Normocytic anemia - slightly improving  Disposition - await BMP this PM.  May sign off of sodium improving.   Recent Labs  Lab 10/31/21 0851 11/01/21 0539  K 3.0*  2.9* 4.1  BUN 45*  49* 43*  CREATININE 1.28*  1.30* 1.02  CALCIUM 9.0  8.8* 9.0  PHOS 2.2* 2.8  HGB 8.9* 9.6*   Inpatient medications:  amLODipine  10 mg Per Tube Daily   chlorhexidine  gluconate (MEDLINE KIT)  15 mL Mouth Rinse BID   Chlorhexidine Gluconate Cloth  6 each Topical Daily   cloNIDine  0.3 mg Per Tube TID   feeding supplement (PROSource TF)  90 mL Per Tube BID   folic acid  1 mg Per Tube Daily   heparin  5,000 Units Subcutaneous Q8H   insulin aspart  0-15 Units Subcutaneous Q4H   insulin aspart  4 Units Subcutaneous Q4H   insulin detemir  6 Units Subcutaneous BID   ipratropium-albuterol  3 mL Nebulization TID   mouth rinse  15 mL Mouth Rinse 10 times per day   multivitamin with minerals  1 tablet Per Tube Daily   pantoprazole sodium  40 mg Per Tube Daily   PHENObarbital  65 mg Intravenous Daily   thiamine injection  100 mg Intravenous Daily    sodium chloride 75 mL/hr at 11/01/21 0700   sodium chloride 250 mL (10/29/21 1207)   sodium chloride Stopped (10/30/21 0847)   ampicillin-sulbactam (UNASYN) IV Stopped  (11/01/21 0358)   dexmedetomidine (PRECEDEX) IV infusion 0.7 mcg/kg/hr (11/01/21 0700)   feeding supplement (VITAL 1.5 CAL) 1,000 mL (11/01/21 0331)   lacosamide (VIMPAT) IV Stopped (10/31/21 2219)   levETIRAcetam Stopped (10/31/21 2155)   sodium chloride, acetaminophen (TYLENOL) oral liquid 160 mg/5 mL, docusate, hydrALAZINE, ipratropium-albuterol, labetalol, polyethylene glycol   Claudia Desanctis, MD 11/01/2021 8:01 AM    Reviewed afternoon labs.  Nephrology will sign off.  AKI resolved, Hypernatremia improving.  Continue D5W for now and titrate down rate or stop per pulm discretion.  Would add back lasix tomorrow (I.e. 40 mg IV daily to start and reassess).  Repleting potassium once now with 40 meq enteric packet.  Spoke with primary team.  Thank you for the consult.  Please do not hesitate to contact me with any questions   Claudia Desanctis, MD 5:11 PM 11/01/2021

## 2021-11-01 NOTE — Progress Notes (Signed)
Subjective: No clinical seizures overnight.  However, continues to be comatose.  ROS: Unable to obtain due to poor mental status  Examination  Vital signs in last 24 hours: Temp:  [97 F (36.1 C)-102.8 F (39.3 C)] 102.8 F (39.3 C) (11/09 1530) Pulse Rate:  [76-111] 110 (11/09 1520) Resp:  [21-37] 26 (11/09 1520) BP: (151-211)/(55-72) 189/66 (11/09 1520) SpO2:  [89 %-99 %] 92 % (11/09 1520) FiO2 (%):  [40 %-60 %] 60 % (11/09 1515) Weight:  [87.6 kg] 87.6 kg (11/09 0500)  General: lying in bed, NAD CVS: pulse-normal rate and rhythm RS: intubated, coarse breath sound BL Extremities: warm, +edema Neuro: comatose, doesn't open eyes to noxious stimuli, left corneal opacity, right pupil reactive to light, right corneal reflex intact, gag reflex intact, doesn't withdraw to noxious stimuli in all extremities  Basic Metabolic Panel: Recent Labs  Lab 10/26/21 1646 10/27/21 0502 10/27/21 1639 10/28/21 0916 10/28/21 1017 10/29/21 0443 10/29/21 0848 10/29/21 1142 10/30/21 0239 10/31/21 0851 11/01/21 0539 11/01/21 1414  NA  --  142   < > 137   < > 140   < > 141 141 149*  147* 155* 151*  K  --  3.3*   < > 5.4*   < > 6.3*   < > 4.9 4.5 3.0*  2.9* 4.1 3.3*  CL  --  115*   < > 109  --  113*  --  114* 113* 113*  111 119* 115*  CO2  --  17*   < > 18*  --  17*  --  17* 18* 25  24 24 25   GLUCOSE  --  128*   < > 231*  --  133*  --  139* 168* 283*  281* 107* 179*  BUN  --  11   < > 29*  --  43*  --  43* 55* 45*  49* 43* 42*  CREATININE  --  1.59*   < > 2.39*  --  2.91*  --  2.61* 2.35* 1.28*  1.30* 1.02 1.15  CALCIUM  --  7.7*   < > 7.5*  --  8.1*  --  7.5* 8.0* 9.0  8.8* 9.0 8.2*  MG 2.3 1.5*  --  2.1  --   --   --   --   --  2.0 2.4  --   PHOS 4.1 3.1  --   --   --  4.9*  --   --  4.2 2.2* 2.8  --    < > = values in this interval not displayed.    CBC: Recent Labs  Lab 10/27/21 0811 10/27/21 1639 10/28/21 0916 10/28/21 1017 10/29/21 0848 10/30/21 0239 10/31/21 0851  11/01/21 0539  WBC 7.3  --  13.7*  --   --  17.6* 12.3* 12.6*  NEUTROABS 4.6  --   --   --   --   --   --   --   HGB 10.8*   < > 8.8* 8.5* 8.8* 9.2* 8.9* 9.6*  HCT 34.4*   < > 28.0* 25.0* 26.0* 27.3* 26.5* 28.3*  MCV 88.7  --  86.4  --   --  84.0 83.3 81.8  PLT 187  --  142*  --   --  156 167 179   < > = values in this interval not displayed.     Coagulation Studies: No results for input(s): LABPROT, INR in the last 72 hours.  Imaging No new brain imaging overnight  ASSESSMENT AND PLAN:  70 year old male with history of hypertension, peripheral vascular disease, cataract, tobacco abuse, left frontal and occipital traumatic contusion with small left frontal SDH in 2019, history of alcohol abuse who presented with focal seizures which have since resolved.   New onset seizures Sacral ulcer Fever Acute encephalopathy ( post-ictal, medications, infectious) -Unclear etiology. Ethanol level was negative on arrival, does have sacral ulcer and possible pneumonia which could be the provoking factors -Infectious work-up ordered by critical care team   Recommendations -Continue Vimpat 200 mg twice daily, Keppra 500 mg twice daily (renal dosing), phenobarbital 65 mg daily - if any seizure overnight, can increase phenobarb to 65 mg BID -Management of rest of comorbidities per primary team -Continue seizure precautions -PRN IV Ativan 2 mg clinical seizure-like activity   I have spent a total of 75minutes with the patient reviewing hospital notes,  test results, labs and examining the patient as well as establishing an assessment and plan.  > 50% of time was spent in direct patient care.    Zeb Comfort Epilepsy [facility] For questions after 5pm please refer to AMION to reach the Neurologist on call

## 2021-11-01 NOTE — Progress Notes (Signed)
RT NOTES; PT ON PS, RR 37, SWITCHED TO PRVC AT THIS TIME.

## 2021-11-02 ENCOUNTER — Inpatient Hospital Stay: Payer: Self-pay

## 2021-11-02 ENCOUNTER — Inpatient Hospital Stay (HOSPITAL_COMMUNITY): Payer: Medicare Other

## 2021-11-02 DIAGNOSIS — R609 Edema, unspecified: Secondary | ICD-10-CM

## 2021-11-02 DIAGNOSIS — G40901 Epilepsy, unspecified, not intractable, with status epilepticus: Secondary | ICD-10-CM | POA: Diagnosis not present

## 2021-11-02 DIAGNOSIS — J9601 Acute respiratory failure with hypoxia: Secondary | ICD-10-CM | POA: Diagnosis not present

## 2021-11-02 DIAGNOSIS — R569 Unspecified convulsions: Secondary | ICD-10-CM | POA: Diagnosis not present

## 2021-11-02 DIAGNOSIS — G9341 Metabolic encephalopathy: Secondary | ICD-10-CM

## 2021-11-02 LAB — RENAL FUNCTION PANEL
Albumin: 1.8 g/dL — ABNORMAL LOW (ref 3.5–5.0)
Anion gap: 9 (ref 5–15)
BUN: 42 mg/dL — ABNORMAL HIGH (ref 8–23)
CO2: 23 mmol/L (ref 22–32)
Calcium: 8.8 mg/dL — ABNORMAL LOW (ref 8.9–10.3)
Chloride: 119 mmol/L — ABNORMAL HIGH (ref 98–111)
Creatinine, Ser: 1.25 mg/dL — ABNORMAL HIGH (ref 0.61–1.24)
GFR, Estimated: >60 mL/min (ref >60)
Glucose, Bld: 117 mg/dL — ABNORMAL HIGH (ref 70–99)
Phosphorus: 3 mg/dL (ref 2.5–4.6)
Potassium: 3.5 mmol/L (ref 3.5–5.1)
Sodium: 151 mmol/L — ABNORMAL HIGH (ref 135–145)

## 2021-11-02 LAB — CULTURE, RESPIRATORY W GRAM STAIN: Culture: NORMAL

## 2021-11-02 LAB — GLUCOSE, CAPILLARY
Glucose-Capillary: 133 mg/dL — ABNORMAL HIGH (ref 70–99)
Glucose-Capillary: 123 mg/dL — ABNORMAL HIGH (ref 70–99)
Glucose-Capillary: 183 mg/dL — ABNORMAL HIGH (ref 70–99)
Glucose-Capillary: 140 mg/dL — ABNORMAL HIGH (ref 70–99)
Glucose-Capillary: 108 mg/dL — ABNORMAL HIGH (ref 70–99)
Glucose-Capillary: 137 mg/dL — ABNORMAL HIGH (ref 70–99)

## 2021-11-02 LAB — CBC
HCT: 30.3 % — ABNORMAL LOW (ref 39.0–52.0)
Hemoglobin: 10.1 g/dL — ABNORMAL LOW (ref 13.0–17.0)
MCH: 27.4 pg (ref 26.0–34.0)
MCHC: 33.3 g/dL (ref 30.0–36.0)
MCV: 82.1 fL (ref 80.0–100.0)
Platelets: 206 10*3/uL (ref 150–400)
RBC: 3.69 MIL/uL — ABNORMAL LOW (ref 4.22–5.81)
RDW: 15.4 % (ref 11.5–15.5)
WBC: 21.4 10*3/uL — ABNORMAL HIGH (ref 4.0–10.5)
nRBC: 0.3 % — ABNORMAL HIGH (ref 0.0–0.2)

## 2021-11-02 MED ORDER — LIDOCAINE-EPINEPHRINE (PF) 1 %-1:200000 IJ SOLN
0.0000 mL | Freq: Once | INTRAMUSCULAR | Status: DC | PRN
  Filled 2021-11-02: qty 30

## 2021-11-02 MED ORDER — HYDROCHLOROTHIAZIDE 25 MG PO TABS
25.0000 mg | ORAL_TABLET | Freq: Every day | ORAL | Status: DC
Start: 2021-11-02 — End: 2021-11-07
  Administered 2021-11-02 – 2021-11-06 (×5): 25 mg
  Filled 2021-11-02 (×5): qty 1

## 2021-11-02 MED ORDER — HYDRALAZINE HCL 50 MG PO TABS
50.0000 mg | ORAL_TABLET | Freq: Four times a day (QID) | ORAL | Status: DC
Start: 2021-11-02 — End: 2021-11-04
  Administered 2021-11-02 – 2021-11-04 (×5): 50 mg
  Filled 2021-11-02 (×5): qty 1

## 2021-11-02 MED ORDER — SODIUM CHLORIDE 0.9% FLUSH
10.0000 mL | Freq: Two times a day (BID) | INTRAVENOUS | Status: DC
  Administered 2021-11-03 – 2021-11-07 (×11): 10 mL
  Administered 2021-11-08: 20 mL
  Administered 2021-11-08 – 2021-12-13 (×57): 10 mL

## 2021-11-02 MED ORDER — POTASSIUM CHLORIDE 20 MEQ PO PACK
40.0000 meq | PACK | Freq: Once | ORAL | Status: AC
  Administered 2021-11-02: 40 meq
  Filled 2021-11-02: qty 2

## 2021-11-02 MED ORDER — LIDOCAINE HCL (PF) 2 % IJ SOLN
0.0000 mL | Freq: Once | INTRAMUSCULAR | Status: DC | PRN
  Filled 2021-11-02: qty 20

## 2021-11-02 MED ORDER — HYDROCHLOROTHIAZIDE 10 MG/ML ORAL SUSPENSION: 25.0000 mg | Freq: Every day | ORAL | Status: DC

## 2021-11-02 MED ORDER — FREE WATER
200.0000 mL | Status: DC
  Administered 2021-11-02 – 2021-11-07 (×23): 200 mL

## 2021-11-02 MED ORDER — SODIUM CHLORIDE 0.9% FLUSH: 10.0000 mL | INTRAVENOUS | Status: DC | PRN

## 2021-11-02 MED ORDER — FREE WATER
100.0000 mL | Status: DC
  Administered 2021-11-02: 100 mL

## 2021-11-02 NOTE — Progress Notes (Signed)
Pt placed back on full vent support due to increased WOB and increased RR into the 40s. Pt tolerating well at this time. RN aware, MD aware, RT will continue to monitor.

## 2021-11-02 NOTE — Progress Notes (Signed)
PICC insertion: PICC inserted in L basilic vein. RUE assessed with ultrasound. No suitable veins found for cannulation. L forearm with bulky dressing, blistering to proximal/ lateral forearm. RN reports pt had infiltration in forearm last week.

## 2021-11-02 NOTE — Progress Notes (Addendum)
Subjective: Continues to be comatose.  ROS: Unable to obtain due to poor mental status  Examination  Vital signs in last 24 hours: Temp:  [96.2 F (35.7 C)-102.8 F (39.3 C)] 97.7 F (36.5 C) (11/10 1146) Pulse Rate:  [82-114] 91 (11/10 1200) Resp:  [20-42] 24 (11/10 1200) BP: (132-211)/(56-85) 144/57 (11/10 1200) SpO2:  [89 %-100 %] 96 % (11/10 1200) FiO2 (%):  [40 %-60 %] 40 % (11/10 1200)  General: lying in bed, NAD CVS: pulse-normal rate and rhythm RS: intubated, coarse breath sound BL Extremities: warm, +edema Neuro: comatose, doesn't open eyes to noxious stimuli, left corneal opacity, right pupil reactive to light, right corneal reflex intact, gag reflex intact, doesn't withdraw to noxious stimuli in all extremities  Basic Metabolic Panel: Recent Labs  Lab 10/26/21 1646 10/27/21 0502 10/27/21 1639 10/28/21 0916 10/28/21 1017 10/29/21 0443 10/29/21 0848 10/30/21 0239 10/31/21 0851 11/01/21 0539 11/01/21 1414 11/02/21 0522  NA  --  142   < > 137   < > 140   < > 141 149*  147* 155* 151* 151*  K  --  3.3*   < > 5.4*   < > 6.3*   < > 4.5 3.0*  2.9* 4.1 3.3* 3.5  CL  --  115*   < > 109  --  113*   < > 113* 113*  111 119* 115* 119*  CO2  --  17*   < > 18*  --  17*   < > 18* 25  24 24 25 23   GLUCOSE  --  128*   < > 231*  --  133*   < > 168* 283*  281* 107* 179* 117*  BUN  --  11   < > 29*  --  43*   < > 55* 45*  49* 43* 42* 42*  CREATININE  --  1.59*   < > 2.39*  --  2.91*   < > 2.35* 1.28*  1.30* 1.02 1.15 1.25*  CALCIUM  --  7.7*   < > 7.5*  --  8.1*   < > 8.0* 9.0  8.8* 9.0 8.2* 8.8*  MG 2.3 1.5*  --  2.1  --   --   --   --  2.0 2.4  --   --   PHOS 4.1 3.1  --   --   --  4.9*  --  4.2 2.2* 2.8  --  3.0   < > = values in this interval not displayed.    CBC: Recent Labs  Lab 10/27/21 0811 10/27/21 1639 10/28/21 0916 10/28/21 1017 10/29/21 0848 10/30/21 0239 10/31/21 0851 11/01/21 0539 11/02/21 0522  WBC 7.3  --  13.7*  --   --  17.6* 12.3* 12.6*  21.4*  NEUTROABS 4.6  --   --   --   --   --   --   --   --   HGB 10.8*   < > 8.8*   < > 8.8* 9.2* 8.9* 9.6* 10.1*  HCT 34.4*   < > 28.0*   < > 26.0* 27.3* 26.5* 28.3* 30.3*  MCV 88.7  --  86.4  --   --  84.0 83.3 81.8 82.1  PLT 187  --  142*  --   --  156 167 179 206   < > = values in this interval not displayed.     Coagulation Studies: No results for input(s): LABPROT, INR in the last 72 hours.  Imaging  No new brain imaging overnight   ASSESSMENT AND PLAN:  70 year old male with history of hypertension, peripheral vascular disease, cataract, tobacco abuse, left frontal and occipital traumatic contusion with small left frontal SDH in 2019, history of alcohol abuse who presented with focal seizures which have since resolved.   New onset seizures Sacral ulcer Fever Acute encephalopathy ( post-ictal, medications, infectious) -Unclear etiology. Ethanol level was negative on arrival, does have sacral ulcer and possible pneumonia which could be the provoking factors -Infectious work-up ordered by critical care team   Recommendations -Discontinue phenobarbital to minimize sedation Will obtain repeat MRI brain without contrast as patient continues to be altered/comatose - Will consider EEG if MRI also negative and patient remains ordered tomorrow -Can also consider adding Nuedexta on amantadine to promote wakefulness -Continue Vimpat 200 mg twice daily, Keppra 500 mg twice daily (renal dosing) - if any seizure overnight, can increase phenobarb to 65 mg BID -Management of rest of comorbidities per primary team -Continue seizure precautions -PRN IV Ativan 2 mg clinical seizure-like activity   I have spent a total of 36 minutes with the patient reviewing hospital notes,  test results, labs and examining the patient as well as establishing an assessment and plan.  > 50% of time was spent in direct patient care.  Zeb Comfort Epilepsy [facility] For questions after 5pm  please refer to AMION to reach the Neurologist on call

## 2021-11-02 NOTE — Progress Notes (Signed)
Peripherally Inserted Central Catheter Placement  The IV Nurse has discussed with the patient and/or persons authorized to consent for the patient, the purpose of this procedure and the potential benefits and risks involved with this procedure.  The benefits include less needle sticks, lab draws from the catheter, and the patient may be discharged home with the catheter. Risks include, but not limited to, infection, bleeding, blood clot (thrombus formation), and puncture of an artery; nerve damage and irregular heartbeat and possibility to perform a PICC exchange if needed/ordered by physician.  Alternatives to this procedure were also discussed.  Bard Power PICC patient education guide, fact sheet on infection prevention and patient information card has been provided to patient /or left at bedside.    PICC Placement Documentation  PICC Double Lumen 97/41/63 PICC Left Basilic 43 cm 0 cm (Active)  Indication for Insertion or Continuance of Line Prolonged intravenous therapies 11/02/21 2145  Exposed Catheter (cm) 0 cm 11/02/21 2145  Site Assessment Clean;Dry;Intact 11/02/21 2145  Lumen #1 Status Saline locked;Blood return noted 11/02/21 2145  Lumen #2 Status Saline locked;Blood return noted 11/02/21 2145  Dressing Type Transparent;Securing device 11/02/21 2145  Dressing Status Clean;Dry;Intact 11/02/21 2145  Antimicrobial disc in place? Yes 11/02/21 2145  Safety Lock Not Applicable 84/53/64 6803  Line Care Connections checked and tightened 11/02/21 2145  Line Adjustment (NICU/IV Team Only) No 11/02/21 2145  Dressing Intervention New dressing 11/02/21 2145  Dressing Change Due 11/09/21 11/02/21 2145       Jeffrey Mullen 11/02/2021, 10:02 PM

## 2021-11-02 NOTE — Progress Notes (Signed)
[facility] ADULT ICU REPLACEMENT PROTOCOL   The patient does apply for the [facility] Adult ICU Electrolyte Replacment Protocol based on the criteria listed below:   1.Exclusion criteria: TCTS patients, ECMO patients, and Dialysis patients 2. Is GFR >/= 30 ml/min? Yes.    Patient's GFR today is >60 3. Is SCr </= 2? Yes.   Patient's SCr is 1.25 mg/dL 4. Did SCr increase >/= 0.5 in 24 hours? No. 5.Pt's weight >40kg  Yes.   6. Abnormal electrolyte(s): K+ 3.5  7. Electrolytes replaced per protocol 8.  Call MD STAT for K+ </= 2.5, Phos </= 1, or Mag </= 1 Physician:  n/a  Jeffrey Mullen 11/02/2021 6:36 AM

## 2021-11-02 NOTE — Progress Notes (Addendum)
NAME:  Jeffrey Mullen, MRN:  [id number], DOB:  1951-04-09, LOS: 59 ADMISSION DATE:  10/23/2021, CONSULTATION DATE:  10/31 REFERRING MD:  Dr. Dina Rich, CHIEF COMPLAINT:  AMS   History of Present Illness:  70 y/o M who presented to [facility] ER on 10/31 via EMS with reports of altered mental status.  Given  right sided gaze, altered and left sided weakness, code stoke called. The patient was taken emergently to CT for imaging which was negative for LVO.  While in the [place], he had a witnessed seizure.    PCCM called for ICU admission.   Chart review shows he was seen in the ER on 10/4 after passing out at the [employer].  Physical exam was negative at the time.  Labs negative.  No further imaging obtained.   Pertinent  Medical History  Alcohol Use - unclear quantity Anxiety / Depression  Congenital blindness of left eye, cataract on R Hepatitis C+, treated in 2016 Seizures   Significant Hospital Events: Including procedures, antibiotic start and stop dates in addition to other pertinent events   10/31 Admit with AMS, seizure, hypertensive emergency, AKI 11/1 MRI brain no acute intracranial process. No etiology is seen for the patient's seizure. 11/02 bedside LP attempted but unsuccessful  11/03  No seizures seen on EEG overnight  11/04 weaning sedation to assess for seizure reoccurrence  11/9 bronchoscopy with noted thick secretions in LLL  Interim History / Subjective:  No events overnight. Patient on PS 10/5 this AM   Objective   Blood pressure (!) 186/73, pulse 91, temperature 99.6 F (37.6 C), temperature source Esophageal, resp. rate (!) 30, height _0  (1.778 m), weight 87.6 kg, SpO2 99 %.    Vent Mode: PSV;CPAP FiO2 (%):  [40 %-60 %] 40 % Set Rate:  [26 bmp] 26 bmp Vt Set:  [440 mL] 440 mL PEEP:  [5 cmH20-8 cmH20] 5 cmH20 Pressure Support:  [8 cmH20-15 cmH20] 15 cmH20 Plateau Pressure:  [22 cmH20] 22 cmH20   Intake/Output Summary (Last 24 hours) at 11/02/2021  0347 Last data filed at 11/02/2021 0030 Gross per 24 hour  Intake 2631.81 ml  Output 2300 ml  Net 331.81 ml   Filed Weights   10/27/21 0500 10/31/21 0500 11/01/21 0500  Weight: 80.3 kg 80 kg 87.6 kg    Examination: General: Critically ill appearing middle-aged African-American male, intubated HEENT: ETT/OG in place, right 2 mm and reactive, cataract on left Neuro: does not open eyes, extends bilaterally uppers with deep stimulation, does not move lowers, does not follow commands, weak cough/gag CV: RRR, HR 84, no MRG  PULM: Rhonchi upper, coarse, thick tan secretions  GI: soft, active bowel sounds  Extremities: warm/dry, diffuse 2+ edema upper and lower extremities Skin: skin tear left dorsal service, extravasation injury to dorsal side of left hand and arm.  Pressure injuries noted on sacrum   Resolved Hospital Problem list   Hyperkalemia  Assessment & Plan:   Status epilepticus, improved -No seizure seen on EEG since 11/3 -meningitis and encephalitis was ruled out with negative LP -MRI 11/1 with no acute process  Alcohol abuse with prior SDH Plan -Neurology Following  -Seizure precautions  -Continue lacosamide, Keppra (renal dosing) and phenobarbital -Aspirations precautions  -Continue high dose thiamine   Acute Hypoxic Respiratory Failure  RLL Pneumonia, Haemophilus influenzae Plan -Continue ventilator support with lung protective strategies  -Titrate FiO2 and PEEP with O2 sat goal 92% (Currently on PS 10/5, 40%), continued thick copious secretions with  poor neuro examination)  -VAP bundle in place  -PAD protocol  AKI due to contrast-induced ATN Baseline sr cr ~0.9 to 1  Nontraumatic rhabdomyolysis Non-anion Gap Metabolic Acidosis Hypokalemia/hypernatremia due to aggressive diuresis  Hypophosphatemia Plan -Trend BMP -Continue D5W infusion, add FW  -Continue to hold diuretics -Avoid nephrotoxins -Replace electrolytes as indicated   Febrile State   Leucocytosis  Plan -Trend WBC and Fever Curve -Follow Culture Data (BAL 11/9) -Obtain U/S upper and lower to r/o DVT   Prediabetes hemoglobin A1c 6.0 Plan -Continue SSI q4 -4 units q4hrs for TF coverage  Vasopressor Extravasation Skin Injury - left hand/arm S/p phentolamine and nitroglycerin application Left Arm Skin Tear Unstageable sacral ulcer (POA) Plan -Appreciate wound care follow-up -Change posterior every 2 hours -Follow-up wound care recommendations  Uncontrolled hypertension Patient blood pressure remains uncontrolled Plan -Continue clonidine 0.3 mg 3 times daily and amlodipine 10 mg once daily, continue lisinopril 40 mg, add scheduled hydralazine  -Continue IV hydralazine and labetalol as needed   Complex social dynamics Patient's daughter does not want to be decision-maker for him, she made her mother to be the primary decision maker. Updated Lisa patient's Ex- significant other, consent obtained for bronchoscopy We will give him a trial of extubation tomorrow, if he fails then will proceed with tracheostomy  Best Practice (right click and "Reselect all SmartList Selections" daily)   Diet/type: tubefeeds DVT prophylaxis: prophylactic heparin  GI prophylaxis: PPI Lines: N/A Foley:  foley in place Code Status:  full code  Total critical care time: 32 minutes  Critical care time was exclusive of separately billable procedures and treating other patients.   Critical care was necessary to treat or prevent imminent or life-threatening deterioration.   Critical care was time spent personally by me on the following activities: development of treatment plan with patient and/or surrogate as well as nursing, discussions with consultants, evaluation of patient's response to treatment, examination of patient, obtaining history from patient or surrogate, ordering and performing treatments and interventions, ordering and review of laboratory studies, ordering and review  of radiographic studies, pulse oximetry and re-evaluation of patient's condition.   Hayden Pedro, AGACNP-BC [facility]  PCCM Pgr: [phone]

## 2021-11-02 NOTE — Progress Notes (Signed)
Upper extremity venous and lower extremity venous has been completed.   Preliminary results in CV Proc.   Jinny Blossom Denni France 11/02/2021 2:01 PM

## 2021-11-02 NOTE — Progress Notes (Signed)
Inserted foley per order by Dr Tacy Learn for urine retention. Randall Hiss, RN assisted me. Patient immediately put out 950 cc urine.   Montez Hageman RN

## 2021-11-03 ENCOUNTER — Inpatient Hospital Stay (HOSPITAL_COMMUNITY): Payer: Medicare Other

## 2021-11-03 DIAGNOSIS — J9601 Acute respiratory failure with hypoxia: Secondary | ICD-10-CM | POA: Diagnosis not present

## 2021-11-03 DIAGNOSIS — J9602 Acute respiratory failure with hypercapnia: Secondary | ICD-10-CM

## 2021-11-03 DIAGNOSIS — I639 Cerebral infarction, unspecified: Secondary | ICD-10-CM

## 2021-11-03 DIAGNOSIS — G40901 Epilepsy, unspecified, not intractable, with status epilepticus: Secondary | ICD-10-CM | POA: Diagnosis not present

## 2021-11-03 DIAGNOSIS — I6389 Other cerebral infarction: Secondary | ICD-10-CM

## 2021-11-03 DIAGNOSIS — R569 Unspecified convulsions: Secondary | ICD-10-CM | POA: Diagnosis not present

## 2021-11-03 LAB — AMMONIA: Ammonia: 24 umol/L (ref 9–35)

## 2021-11-03 LAB — ECHOCARDIOGRAM COMPLETE BUBBLE STUDY
Area-P 1/2: 4.6 cm2
S' Lateral: 2.4 cm

## 2021-11-03 LAB — MAGNESIUM: Magnesium: 2.4 mg/dL (ref 1.7–2.4)

## 2021-11-03 LAB — RENAL FUNCTION PANEL
Albumin: 1.8 g/dL — ABNORMAL LOW (ref 3.5–5.0)
Anion gap: 8 (ref 5–15)
BUN: 32 mg/dL — ABNORMAL HIGH (ref 8–23)
CO2: 28 mmol/L (ref 22–32)
Calcium: 8.8 mg/dL — ABNORMAL LOW (ref 8.9–10.3)
Chloride: 121 mmol/L — ABNORMAL HIGH (ref 98–111)
Creatinine, Ser: 1.06 mg/dL (ref 0.61–1.24)
GFR, Estimated: >60 mL/min (ref >60)
Glucose, Bld: 103 mg/dL — ABNORMAL HIGH (ref 70–99)
Phosphorus: 3.4 mg/dL (ref 2.5–4.6)
Potassium: 3.4 mmol/L — ABNORMAL LOW (ref 3.5–5.1)
Sodium: 157 mmol/L — ABNORMAL HIGH (ref 135–145)

## 2021-11-03 LAB — GLUCOSE, CAPILLARY
Glucose-Capillary: 72 mg/dL (ref 70–99)
Glucose-Capillary: 91 mg/dL (ref 70–99)
Glucose-Capillary: 122 mg/dL — ABNORMAL HIGH (ref 70–99)
Glucose-Capillary: 100 mg/dL — ABNORMAL HIGH (ref 70–99)
Glucose-Capillary: 119 mg/dL — ABNORMAL HIGH (ref 70–99)
Glucose-Capillary: 132 mg/dL — ABNORMAL HIGH (ref 70–99)

## 2021-11-03 LAB — CBC
HCT: 29.3 % — ABNORMAL LOW (ref 39.0–52.0)
Hemoglobin: 9.8 g/dL — ABNORMAL LOW (ref 13.0–17.0)
MCH: 27.6 pg (ref 26.0–34.0)
MCHC: 33.4 g/dL (ref 30.0–36.0)
MCV: 82.5 fL (ref 80.0–100.0)
Platelets: 226 10*3/uL (ref 150–400)
RBC: 3.55 MIL/uL — ABNORMAL LOW (ref 4.22–5.81)
RDW: 15.7 % — ABNORMAL HIGH (ref 11.5–15.5)
WBC: 21.5 10*3/uL — ABNORMAL HIGH (ref 4.0–10.5)
nRBC: 0.1 % (ref 0.0–0.2)

## 2021-11-03 LAB — SODIUM
Sodium: 160 mmol/L — ABNORMAL HIGH (ref 135–145)
Sodium: 159 mmol/L — ABNORMAL HIGH (ref 135–145)

## 2021-11-03 MED ORDER — HYDRALAZINE HCL 20 MG/ML IJ SOLN
INTRAMUSCULAR | Status: AC
  Administered 2021-11-03: 20 mg via INTRAVENOUS
  Filled 2021-11-03: qty 1

## 2021-11-03 MED ORDER — SODIUM CHLORIDE 0.9 % IV SOLN
2.0000 g | INTRAVENOUS | Status: DC
  Administered 2021-11-03 – 2021-11-04 (×2): 2 g via INTRAVENOUS
  Filled 2021-11-03 (×2): qty 20

## 2021-11-03 MED ORDER — FENTANYL CITRATE PF 50 MCG/ML IJ SOSY
200.0000 ug | PREFILLED_SYRINGE | Freq: Once | INTRAMUSCULAR | Status: DC
  Filled 2021-11-03: qty 4

## 2021-11-03 MED ORDER — MAGNESIUM SULFATE 2 GM/50ML IV SOLN
2.0000 g | Freq: Once | INTRAVENOUS | Status: DC
  Filled 2021-11-03: qty 50

## 2021-11-03 MED ORDER — ETOMIDATE 2 MG/ML IV SOLN
40.0000 mg | Freq: Once | INTRAVENOUS | Status: AC
  Administered 2021-11-03: 20 mg via INTRAVENOUS
  Filled 2021-11-03: qty 20

## 2021-11-03 MED ORDER — DEXTROSE-NACL 5-0.45 % IV SOLN: INTRAVENOUS | Status: DC

## 2021-11-03 MED ORDER — DEXTROSE 5 % IV SOLN: INTRAVENOUS | Status: DC

## 2021-11-03 MED ORDER — PROPOFOL 10 MG/ML IV BOLUS
500.0000 mg | Freq: Once | INTRAVENOUS | Status: DC
  Filled 2021-11-03: qty 60

## 2021-11-03 MED ORDER — VECURONIUM BROMIDE 10 MG IV SOLR
10.0000 mg | Freq: Once | INTRAVENOUS | Status: AC
  Administered 2021-11-03: 10 mg via INTRAVENOUS
  Filled 2021-11-03: qty 10

## 2021-11-03 MED ORDER — SODIUM CHLORIDE 0.9% FLUSH
10.0000 mL | Freq: Once | INTRAVENOUS | Status: AC
  Administered 2021-11-03: 10 mL via INTRAVENOUS

## 2021-11-03 MED ORDER — ATORVASTATIN CALCIUM 40 MG PO TABS
40.0000 mg | ORAL_TABLET | Freq: Every day | ORAL | Status: DC
  Administered 2021-11-03 – 2021-11-04 (×2): 40 mg
  Filled 2021-11-03 (×2): qty 1

## 2021-11-03 MED ORDER — POTASSIUM CHLORIDE 10 MEQ/50ML IV SOLN
10.0000 meq | INTRAVENOUS | Status: AC
  Administered 2021-11-03 (×4): 10 meq via INTRAVENOUS
  Filled 2021-11-03 (×4): qty 50

## 2021-11-03 MED ORDER — MIDAZOLAM HCL 2 MG/2ML IJ SOLN
5.0000 mg | Freq: Once | INTRAMUSCULAR | Status: DC
  Filled 2021-11-03: qty 6

## 2021-11-03 MED ORDER — CLOPIDOGREL BISULFATE 75 MG PO TABS
75.0000 mg | ORAL_TABLET | Freq: Every day | ORAL | Status: DC
  Administered 2021-11-03: 75 mg
  Filled 2021-11-03: qty 1

## 2021-11-03 MED ORDER — HYDRALAZINE HCL 20 MG/ML IJ SOLN: 20.0000 mg | Freq: Once | INTRAMUSCULAR | Status: AC

## 2021-11-03 MED ORDER — CLOPIDOGREL BISULFATE 75 MG PO TABS
75.0000 mg | ORAL_TABLET | Freq: Every day | ORAL | Status: DC
  Administered 2021-11-04: 75 mg
  Filled 2021-11-03 (×2): qty 1

## 2021-11-03 MED ORDER — ASPIRIN 81 MG PO CHEW
81.0000 mg | CHEWABLE_TABLET | Freq: Every day | ORAL | Status: DC
  Administered 2021-11-03 – 2021-12-04 (×31): 81 mg
  Filled 2021-11-03 (×32): qty 1

## 2021-11-03 MED ORDER — PERFLUTREN LIPID MICROSPHERE
1.0000 mL | INTRAVENOUS | Status: DC | PRN
Start: 2021-11-03 — End: 2021-11-03
  Administered 2021-11-03: 2 mL via INTRAVENOUS
  Filled 2021-11-03: qty 10

## 2021-11-03 NOTE — Progress Notes (Signed)
  Echocardiogram 2D Echocardiogram has been complete. 11/03/2021, 1:37 PM

## 2021-11-03 NOTE — Procedures (Signed)
Cortrak  Person Inserting Tube:  Alroy Dust, Verta Riedlinger L, RD Tube Type:  Cortrak - 43 inches Tube Size:  10 Tube Location:  Left nare Initial Placement:  Stomach Secured by: Bridle Technique Used to Measure Tube Placement:  Marking at nare/corner of mouth Cortrak Secured At:  75 cm  Cortrak Tube Team Note:  Consult received to place a Cortrak feeding tube.   X-ray is required, abdominal x-ray has been ordered by the Cortrak team. Please confirm tube placement before using the Cortrak tube.   If the tube becomes dislodged please keep the tube and contact the Cortrak team at [email or web address] (password [email or web address]) for replacement.  If after hours and replacement cannot be delayed, place a NG tube and confirm placement with an abdominal x-ray.    Hermina Barters BS, PLDN Clinical Dietitian See [facility] for contact information.

## 2021-11-03 NOTE — Progress Notes (Signed)
[facility] ADULT ICU REPLACEMENT PROTOCOL   The patient does apply for the [facility] Adult ICU Electrolyte Replacment Protocol based on the criteria listed below:   1.Exclusion criteria: TCTS patients, ECMO patients, and Dialysis patients 2. Is GFR >/= 30 ml/min? Yes.    Patient's GFR today is >60 3. Is SCr </= 2? Yes.   Patient's SCr is 1.06 mg/dL 4. Did SCr increase >/= 0.5 in 24 hours? No 5.Pt's weight >40kg  Yes.   6. Abnormal electrolyte(s): K+ 3.4  7. Electrolytes replaced per protocol 8.  Call MD STAT for K+ </= 2.5, Phos </= 1, or Mag </= 1 Physician:  n/a  Darlys Gales 11/03/2021 6:14 AM

## 2021-11-03 NOTE — Procedures (Signed)
Percutaneous Tracheostomy Procedure Note   QUINTAVIUS NIEBUHR  [id number]  24-Mar-1951  Date:11/03/21  Time:2:00 PM   Provider Performing:Shanen Norris  Procedure: Percutaneous Tracheostomy with Bronchoscopic Guidance (46503)  Indication(s) Acute respiratory failure  Consent Risks of the procedure as well as the alternatives and risks of each were explained to the patient and/or caregiver.  Consent for the procedure was obtained.  Anesthesia Etomidate, Versed, Fentanyl, Vecuronium   Time Out Verified patient identification, verified procedure, site/side was marked, verified correct patient position, special equipment/implants available, medications/allergies/relevant history reviewed, required imaging and test results available.   Sterile Technique Maximal sterile technique including sterile barrier drape, hand hygiene, sterile gown, sterile gloves, mask, hair covering.    Procedure Description Appropriate anatomy identified by palpation.  Patient's neck prepped and draped in sterile fashion.  1% lidocaine with epinephrine was used to anesthetize skin overlying neck.  1.5cm incision made and blunt dissection performed until tracheal rings could be easily palpated.   Then a size 6 Shiley tracheostomy was placed under bronchoscopic visualization using usual Seldinger technique and serial dilation.   Bronchoscope confirmed placement above the carina.  Tracheostomy was sutured in place with adhesive pad to protect skin under pressure.    Patient connected to ventilator.   Complications/Tolerance None; patient tolerated the procedure well. Chest X-ray is ordered to confirm no post-procedural complication.   EBL Minimal   Specimen(s) None

## 2021-11-03 NOTE — Progress Notes (Signed)
SLP Cancellation Note  Patient Details Name: Jeffrey Mullen MRN: [id number] DOB: 02-28-1951   Cancelled treatment:       Reason Eval/Treat Not Completed: Patient not medically ready Patient with new tracheostomy 11/11. Orders for SLP eval and treat for PMSV and swallowing received. Pt is currently on the vent, but SLP will follow pt closely for readiness for SLP interventions as appropriate.   Catha Ontko I. Hardin Negus, [place], [place] Office number [phone] Pager [place] 11/03/2021, 4:09 PM

## 2021-11-03 NOTE — Progress Notes (Addendum)
Subjective: Patient continues to be comatose.  [id number] to obtain due to poor mental status  Examination  Vital signs in last 24 hours: Temp:  [97.7 F (36.5 C)-101.2 F (38.4 C)] 98 F (36.7 C) (11/11 0800) Pulse Rate:  [83-107] 91 (11/11 1100) Resp:  [22-33] 26 (11/11 1100) BP: (96-170)/(47-78) 96/47 (11/11 1100) SpO2:  [92 %-100 %] 99 % (11/11 1100) FiO2 (%):  [40 %] 40 % (11/11 0822)  General: lying in bed, NAD CVS: pulse-normal rate and rhythm RS: intubated, coarse breath sound BL Extremities: warm, +edema Neuro: comatose, doesn't open eyes to noxious stimuli, left corneal opacity, right pupil reactive to light, right corneal reflex intact, gag reflex intact, doesn't withdraw to noxious stimuli in all extremities   Basic Metabolic Panel: Recent Labs  Lab 10/28/21 0916 10/28/21 1017 10/30/21 0239 10/31/21 0851 11/01/21 0539 11/01/21 1414 11/02/21 0522 11/03/21 0521  NA 137   < > 141 149*  147* 155* 151* 151* 157*  K 5.4*   < > 4.5 3.0*  2.9* 4.1 3.3* 3.5 3.4*  CL 109   < > 113* 113*  111 119* 115* 119* 121*  CO2 18*   < > 18* 25  24 24 25 23 28   GLUCOSE 231*   < > 168* 283*  281* 107* 179* 117* 103*  BUN 29*   < > 55* 45*  49* 43* 42* 42* 32*  CREATININE 2.39*   < > 2.35* 1.28*  1.30* 1.02 1.15 1.25* 1.06  CALCIUM 7.5*   < > 8.0* 9.0  8.8* 9.0 8.2* 8.8* 8.8*  MG 2.1  --   --  2.0 2.4  --   --   --   PHOS  --    < > 4.2 2.2* 2.8  --  3.0 3.4   < > = values in this interval not displayed.    CBC: Recent Labs  Lab 10/30/21 0239 10/31/21 0851 11/01/21 0539 11/02/21 0522 11/03/21 0521  WBC 17.6* 12.3* 12.6* 21.4* 21.5*  HGB 9.2* 8.9* 9.6* 10.1* 9.8*  HCT 27.3* 26.5* 28.3* 30.3* 29.3*  MCV 84.0 83.3 81.8 82.1 82.5  PLT 156 167 179 206 226     Coagulation Studies: No results for input(s): LABPROT, INR in the last 72 hours.  Imaging  MRI brain without contrast 11/02/2021: 1. Interval development of multiple scattered acute ischemic infarcts  involving the bilateral cerebral hemispheres and left pons,largest measuring 4.9 cm at the right occipital lobe. A central thromboembolic etiology is suspected given the various vascular distributions involved. 2. Otherwise stable appearance of the brain with underlying chronic microvascular ischemic disease. Chronic encephalomalacia at the left frontal and right occipital lobes likely related to prior traumatic brain injury with coup/contrecoup pattern.       CTA head and neck 10/23/2021:  1.High-grade stenosis of the proximal left internal carotid artery at the bifurcation, measuring to 1.8 mm. 2. Bilateral high-grade stenoses within the cavernous internal carotid    arteries bilaterally. 3. High-grade stenosis or occlusion of the distal right vertebral artery at the dura. The vessel is reconstituted at the right PICA origin.  ASSESSMENT AND PLAN:  70 year old male with history of hypertension, peripheral vascular disease, cataract, tobacco abuse, left frontal and occipital traumatic contusion with small left frontal SDH in 2019, history of alcohol abuse who presented with focal seizures which have since resolved.   Multiple acute infarcts New onset seizures Sacral ulcer Acute encephalopathy ( post-ictal, medications, infectious) -Unclear etiology. Ethanol level was negative on arrival,  does have sacral ulcer and possible pneumonia which could be the provoking factors -Etiology for stroke: Suspected cardioembolic.  Could also be due to hypotension in a patient with significant intracranial stenosis -tPA not administered as no clear last known normal   Recommendations -TTE ordered and pending -We will also start patient DAPT due to intracranial stenosis.  Aspirin 81 mg daily as well as Plavix 75 mg daily for 3 months for secondary stroke prevention -We will start patient on atorvastatin 40 mg daily for secondary stroke prevention -Goal SBP <160, avoid hypotension. The strokes most likely  happened more than 24 hours ago therefore no need to do permissive hypertension anymore. -We will request to check telemetry leads in case patient has paroxysmal atrial fibrillation -Continue Vimpat 200 mg twice daily, Keppra 500 mg twice daily (renal dosing) -If patient's mental status improves, he will need follow-up with neurology and work-up for carotid stenosis and potential stenting -Management of rest of comorbidities per primary team -Continue seizure precautions -PRN IV Ativan 2 mg clinical seizure-like activity   I have spent a total of 45 minutes with the patient reviewing hospital notes,  test results, labs and examining the patient as well as establishing an assessment and plan.  > 50% of time was spent in direct patient care.    Zeb Comfort Epilepsy [facility] For questions after 5pm please refer to AMION to reach the Neurologist on call

## 2021-11-03 NOTE — Progress Notes (Addendum)
NAME:  Jeffrey Mullen, MRN:  [id number], DOB:  05/26/1951, LOS: 79 ADMISSION DATE:  10/23/2021, CONSULTATION DATE:  10/31 REFERRING MD:  Dr. Dina Rich, CHIEF COMPLAINT:  AMS   History of Present Illness:  71 y/o M who presented to [facility] ER on 10/31 via EMS with reports of altered mental status.  Given  right sided gaze, altered and left sided weakness, code stoke called. The patient was taken emergently to CT for imaging which was negative for LVO.  While in the [place], he had a witnessed seizure.    PCCM called for ICU admission.   Chart review shows he was seen in the ER on 10/4 after passing out at the [employer].  Physical exam was negative at the time.  Labs negative.  No further imaging obtained.   Pertinent  Medical History  Alcohol Use - unclear quantity Anxiety / Depression  Congenital blindness of left eye, cataract on R Hepatitis C+, treated in 2016 Seizures   Significant Hospital Events: Including procedures, antibiotic start and stop dates in addition to other pertinent events   10/31 Admit with AMS, seizure, hypertensive emergency, AKI 11/1 MRI brain no acute intracranial process. No etiology is seen for the patient's seizure. 11/02 bedside LP attempted but unsuccessful  11/03  No seizures seen on EEG overnight  11/04 weaning sedation to assess for seizure reoccurrence  11/9 bronchoscopy with noted thick secretions in LLL 11/11 Scheduled for trach  at 13:30  Interim History / Subjective:  No events overnight.  Has been off all sedation since 11/10 at  Currently no weaning NPO for pending trach T Max 101.2, K 3.4, WBC 21.5, Na 151 Last Blood Cx 11/8 Pending Last sputum Cx >> 11/9 >> Pending   Objective   Blood pressure 139/67, pulse 95, temperature (!) 101.2 F (38.4 C), temperature source Esophageal, resp. rate (!) 24, height _0  (1.778 m), weight 87.6 kg, SpO2 99 %.    Vent Mode: PRVC FiO2 (%):  [40 %] 40 % Set Rate:  [26 bmp] 26 bmp Vt Set:  [440  mL] 440 mL PEEP:  [5 cmH20] 5 cmH20 Plateau Pressure:  [17 cmH20-21 cmH20] 17 cmH20   Intake/Output Summary (Last 24 hours) at 11/03/2021 0841 Last data filed at 11/03/2021 0800 Gross per 24 hour  Intake 2631.33 ml  Output 3480 ml  Net -848.67 ml   Filed Weights   10/27/21 0500 10/31/21 0500 11/01/21 0500  Weight: 80.3 kg 80 kg 87.6 kg    Examination: General: Critically ill appearing middle-aged African-American male, intubated, off sedation , unresponsive HEENT: ETT/OG in place, right 2 mm and reactive, cataract on left Neuro: does not open eyes, extends bilaterally uppers with deep stimulation, does not move lowers, does not follow commands, very weak cough/gag CV: RRR, HR 94, no MRG  PULM: Bilateral chest excursion, Rhonchi upper, coarse, thick tan secretions  GI: soft, active bowel sounds , ND, NT Extremities: warm/dry, diffuse 1+ edema upper and lower extremities, Feet are cool to touch with slow cap refill, and slight discoloration>> LE doppler with no evidence DVT Skin: skin tear left dorsal service, extravasation injury to dorsal side of left hand and arm. Dressing is clean dry and intact. Per nursing  Pressure injuries per sacrum   Resolved Hospital Problem list   Hyperkalemia  Assessment & Plan:   Status epilepticus, improved -No seizure seen on EEG since 11/3 -meningitis and encephalitis was ruled out with negative LP -MRI 11/1 with no acute process  Alcohol abuse with prior SDH Plan -Neurology Following  -Seizure precautions  -Continue lacosamide, Keppra (renal dosing)  -Aspirations precautions  -Continue high dose thiamine  - Trend Mag , Goal > 2 , replete as needed  Acute Hypoxic Respiratory Failure  RLL Pneumonia, Haemophilus influenzae Plan -Continue ventilator support with lung protective strategies  -Titrate FiO2 and PEEP with O2 sat goal 92% (Currently on rate of 26, PEEP of 5, TV 440  40%), continued thick copious secretions with poor neuro  examination)  -VAP bundle in place  -PAD protocol - For bedside Trach 11/11  AKI due to contrast-induced ATN Baseline sr cr ~0.9 to 1  Nontraumatic rhabdomyolysis Non-anion Gap Metabolic Acidosis Hypokalemia/hypernatremia due to aggressive diuresis  Hypophosphatemia Plan -Trend BMP -Will add D5.45 at 50 while NPO -Continue free water 200 cc's Q 4 -Continue to hold diuretics -Avoid nephrotoxins -Trend BMET and Replace electrolytes as indicated   Febrile State  Leucocytosis  Plan -Trend WBC and Fever Curve -Follow Culture Data (BAL 11/9) -Obtain U/S upper and lower to r/o DVT   Prediabetes hemoglobin A1c 6.0 Plan -Continue SSI q4 -4 units q4hrs for TF coverage  Vasopressor Extravasation Skin Injury - left hand/arm S/p phentolamine and nitroglycerin application Left Arm Skin Tear Unstageable sacral ulcer (POA) Plan -Appreciate wound care follow-up -Change posterior every 2 hours -Follow-up wound care recommendations  Uncontrolled hypertension Patient blood pressure remains uncontrolled Plan -Continue clonidine 0.3 mg 3 times daily and amlodipine 10 mg once daily, continue lisinopril 40 mg, add scheduled hydralazine  -Continue IV hydralazine and labetalol as needed   Complex social dynamics Patient's daughter does not want to be decision-maker for him, she made her mother to be the primary decision maker. Updated Lisa patient's Ex- significant other, consent obtained for bronchoscopy Plan is for bedside trach 11/11 to facilitate weaning  Best Practice (right click and "Reselect all SmartList Selections" daily)   Diet/type: tubefeeds DVT prophylaxis: prophylactic heparin  GI prophylaxis: PPI Lines: N/A Foley:  foley in place Code Status:  full code  Total critical care time: 35 minutes  Critical care time was exclusive of separately billable procedures and treating other patients.   Critical care was necessary to treat or prevent imminent or  life-threatening deterioration.   Critical care was time spent personally by me on the following activities: development of treatment plan with patient and/or surrogate as well as nursing, discussions with consultants, evaluation of patient's response to treatment, examination of patient, obtaining history from patient or surrogate, ordering and performing treatments and interventions, ordering and review of laboratory studies, ordering and review of radiographic studies, pulse oximetry and re-evaluation of patient's condition.   Magdalen Spatz, MSN, AGACNP-BC [place] for personal pager PCCM on call pager [phone]   11/03/2021 9:02 AM

## 2021-11-03 NOTE — Procedures (Signed)
Diagnostic Bronchoscopy  RANALD ALESSIO  [id number]  April 16, 1951  Date:11/03/21  Time:1:59 PM   Provider Performing:Nathalee Smarr Rodman Pickle   Procedure: Diagnostic Bronchoscopy [phone])  Indication(s) Assist with direct visualization of tracheostomy placement  Consent Risks of the procedure as well as the alternatives and risks of each were explained to the patient and/or caregiver.  Consent for the procedure was obtained.   Anesthesia See separate tracheostomy note   Time Out Verified patient identification, verified procedure, site/side was marked, verified correct patient position, special equipment/implants available, medications/allergies/relevant history reviewed, required imaging and test results available.   Sterile Technique Usual hand hygiene, masks, gowns, and gloves were used   Procedure Description Bronchoscope advanced through endotracheal tube and into airway.  After suctioning out tracheal secretions, bronchoscope used to provide direct visualization of tracheostomy placement.   Complications/Tolerance None   EBL None  Specimen(s) None

## 2021-11-03 NOTE — Progress Notes (Signed)
Patient transported to MRI and back to 4N21 via the ventilator with no complications.

## 2021-11-04 DIAGNOSIS — Z930 Tracheostomy status: Secondary | ICD-10-CM

## 2021-11-04 DIAGNOSIS — G9341 Metabolic encephalopathy: Secondary | ICD-10-CM | POA: Diagnosis not present

## 2021-11-04 DIAGNOSIS — Z9911 Dependence on respirator [ventilator] status: Secondary | ICD-10-CM | POA: Diagnosis not present

## 2021-11-04 DIAGNOSIS — I639 Cerebral infarction, unspecified: Secondary | ICD-10-CM | POA: Diagnosis not present

## 2021-11-04 DIAGNOSIS — J9610 Chronic respiratory failure, unspecified whether with hypoxia or hypercapnia: Secondary | ICD-10-CM | POA: Diagnosis not present

## 2021-11-04 DIAGNOSIS — G40901 Epilepsy, unspecified, not intractable, with status epilepticus: Secondary | ICD-10-CM | POA: Diagnosis not present

## 2021-11-04 DIAGNOSIS — I6389 Other cerebral infarction: Secondary | ICD-10-CM

## 2021-11-04 LAB — LIPID PANEL
Cholesterol: 126 mg/dL (ref 0–200)
HDL: 13 mg/dL — ABNORMAL LOW (ref >40)
LDL Cholesterol: 66 mg/dL (ref 0–99)
Total CHOL/HDL Ratio: 9.7 RATIO
Triglycerides: 237 mg/dL — ABNORMAL HIGH (ref <150)
VLDL: 47 mg/dL — ABNORMAL HIGH (ref 0–40)

## 2021-11-04 LAB — COMPREHENSIVE METABOLIC PANEL
ALT: 103 U/L — ABNORMAL HIGH (ref 0–44)
AST: 177 U/L — ABNORMAL HIGH (ref 15–41)
Albumin: 1.6 g/dL — ABNORMAL LOW (ref 3.5–5.0)
Alkaline Phosphatase: 338 U/L — ABNORMAL HIGH (ref 38–126)
Anion gap: 8 (ref 5–15)
BUN: 36 mg/dL — ABNORMAL HIGH (ref 8–23)
CO2: 28 mmol/L (ref 22–32)
Calcium: 8.7 mg/dL — ABNORMAL LOW (ref 8.9–10.3)
Chloride: 120 mmol/L — ABNORMAL HIGH (ref 98–111)
Creatinine, Ser: 1.37 mg/dL — ABNORMAL HIGH (ref 0.61–1.24)
GFR, Estimated: 57 mL/min — ABNORMAL LOW (ref >60)
Glucose, Bld: 172 mg/dL — ABNORMAL HIGH (ref 70–99)
Potassium: 3.1 mmol/L — ABNORMAL LOW (ref 3.5–5.1)
Sodium: 156 mmol/L — ABNORMAL HIGH (ref 135–145)
Total Bilirubin: 0.6 mg/dL (ref 0.3–1.2)
Total Protein: 5.9 g/dL — ABNORMAL LOW (ref 6.5–8.1)

## 2021-11-04 LAB — CULTURE, BAL-QUANTITATIVE W GRAM STAIN: Culture: NO GROWTH

## 2021-11-04 LAB — CBC WITH DIFFERENTIAL/PLATELET
Abs Immature Granulocytes: 0.4 10*3/uL — ABNORMAL HIGH (ref 0.00–0.07)
Basophils Absolute: 0.1 10*3/uL (ref 0.0–0.1)
Basophils Relative: 0 %
Eosinophils Absolute: 0.1 10*3/uL (ref 0.0–0.5)
Eosinophils Relative: 1 %
HCT: 27.3 % — ABNORMAL LOW (ref 39.0–52.0)
Hemoglobin: 9 g/dL — ABNORMAL LOW (ref 13.0–17.0)
Immature Granulocytes: 3 %
Lymphocytes Relative: 15 %
Lymphs Abs: 2.5 10*3/uL (ref 0.7–4.0)
MCH: 28 pg (ref 26.0–34.0)
MCHC: 33 g/dL (ref 30.0–36.0)
MCV: 85 fL (ref 80.0–100.0)
Monocytes Absolute: 1 10*3/uL (ref 0.1–1.0)
Monocytes Relative: 6 %
Neutro Abs: 12.1 10*3/uL — ABNORMAL HIGH (ref 1.7–7.7)
Neutrophils Relative %: 75 %
Platelets: 253 10*3/uL (ref 150–400)
RBC: 3.21 MIL/uL — ABNORMAL LOW (ref 4.22–5.81)
RDW: 16.1 % — ABNORMAL HIGH (ref 11.5–15.5)
WBC: 16.2 10*3/uL — ABNORMAL HIGH (ref 4.0–10.5)
nRBC: 0.1 % (ref 0.0–0.2)

## 2021-11-04 LAB — BASIC METABOLIC PANEL
Anion gap: 6 (ref 5–15)
Anion gap: 8 (ref 5–15)
BUN: 32 mg/dL — ABNORMAL HIGH (ref 8–23)
BUN: 28 mg/dL — ABNORMAL HIGH (ref 8–23)
CO2: 27 mmol/L (ref 22–32)
CO2: 27 mmol/L (ref 22–32)
Calcium: 8.3 mg/dL — ABNORMAL LOW (ref 8.9–10.3)
Calcium: 8.4 mg/dL — ABNORMAL LOW (ref 8.9–10.3)
Chloride: 120 mmol/L — ABNORMAL HIGH (ref 98–111)
Chloride: 118 mmol/L — ABNORMAL HIGH (ref 98–111)
Creatinine, Ser: 1.14 mg/dL (ref 0.61–1.24)
Creatinine, Ser: 1.13 mg/dL (ref 0.61–1.24)
GFR, Estimated: >60 mL/min (ref >60)
GFR, Estimated: >60 mL/min (ref >60)
Glucose, Bld: 159 mg/dL — ABNORMAL HIGH (ref 70–99)
Glucose, Bld: 147 mg/dL — ABNORMAL HIGH (ref 70–99)
Potassium: 3.3 mmol/L — ABNORMAL LOW (ref 3.5–5.1)
Potassium: 3.7 mmol/L (ref 3.5–5.1)
Sodium: 153 mmol/L — ABNORMAL HIGH (ref 135–145)
Sodium: 153 mmol/L — ABNORMAL HIGH (ref 135–145)

## 2021-11-04 LAB — GLUCOSE, CAPILLARY
Glucose-Capillary: 100 mg/dL — ABNORMAL HIGH (ref 70–99)
Glucose-Capillary: 103 mg/dL — ABNORMAL HIGH (ref 70–99)
Glucose-Capillary: 129 mg/dL — ABNORMAL HIGH (ref 70–99)
Glucose-Capillary: 138 mg/dL — ABNORMAL HIGH (ref 70–99)
Glucose-Capillary: 142 mg/dL — ABNORMAL HIGH (ref 70–99)
Glucose-Capillary: 156 mg/dL — ABNORMAL HIGH (ref 70–99)

## 2021-11-04 LAB — MAGNESIUM: Magnesium: 2.5 mg/dL — ABNORMAL HIGH (ref 1.7–2.4)

## 2021-11-04 LAB — AMMONIA: Ammonia: 27 umol/L (ref 9–35)

## 2021-11-04 LAB — PHOSPHORUS: Phosphorus: 3 mg/dL (ref 2.5–4.6)

## 2021-11-04 LAB — SODIUM: Sodium: 142 mmol/L (ref 135–145)

## 2021-11-04 LAB — VITAMIN B12: Vitamin B-12: 890 pg/mL (ref 180–914)

## 2021-11-04 LAB — TSH: TSH: 4.708 u[IU]/mL — ABNORMAL HIGH (ref 0.350–4.500)

## 2021-11-04 MED ORDER — POTASSIUM CHLORIDE 20 MEQ PO PACK
40.0000 meq | PACK | Freq: Two times a day (BID) | ORAL | Status: DC
  Administered 2021-11-04: 40 meq

## 2021-11-04 MED ORDER — FUROSEMIDE 10 MG/ML IJ SOLN
20.0000 mg | Freq: Once | INTRAMUSCULAR | Status: AC
  Administered 2021-11-04: 20 mg via INTRAVENOUS
  Filled 2021-11-04: qty 2

## 2021-11-04 MED ORDER — POTASSIUM CHLORIDE 20 MEQ PO PACK
40.0000 meq | PACK | Freq: Two times a day (BID) | ORAL | Status: DC
  Filled 2021-11-04: qty 2

## 2021-11-04 MED ORDER — HYDRALAZINE HCL 25 MG PO TABS
25.0000 mg | ORAL_TABLET | Freq: Four times a day (QID) | ORAL | Status: DC
  Administered 2021-11-05 – 2021-11-18 (×48): 25 mg
  Filled 2021-11-04 (×46): qty 1

## 2021-11-04 MED ORDER — POTASSIUM CHLORIDE 10 MEQ/100ML IV SOLN
10.0000 meq | INTRAVENOUS | Status: AC
  Administered 2021-11-04 (×2): 10 meq via INTRAVENOUS
  Filled 2021-11-04 (×2): qty 100

## 2021-11-04 MED ORDER — POTASSIUM CHLORIDE 20 MEQ PO PACK
20.0000 meq | PACK | ORAL | Status: AC
  Administered 2021-11-04 (×2): 20 meq
  Filled 2021-11-04 (×2): qty 1

## 2021-11-04 MED ORDER — POTASSIUM CHLORIDE 10 MEQ/50ML IV SOLN
10.0000 meq | INTRAVENOUS | Status: AC
  Administered 2021-11-04 (×4): 10 meq via INTRAVENOUS
  Filled 2021-11-04 (×4): qty 50

## 2021-11-04 MED ORDER — CLONIDINE HCL 0.1 MG PO TABS
0.1000 mg | ORAL_TABLET | Freq: Three times a day (TID) | ORAL | Status: DC
Start: 2021-11-04 — End: 2021-11-27
  Administered 2021-11-04 – 2021-11-27 (×62): 0.1 mg
  Filled 2021-11-04 (×63): qty 1

## 2021-11-04 NOTE — Progress Notes (Signed)
Pt K+ 3.1 with creat 1.37 and GFR 57. Elink CCM electrolyte protocol initiated.

## 2021-11-04 NOTE — Progress Notes (Signed)
Subjective: Patient continues to be comatose.  JAS:[id number] to obtain due to poor mental status  Examination  Current vital signs: BP (!) 135/57   Pulse 91   Temp (!) 100.8 F (38.2 C) (Oral) Comment: RN notified  Resp (!) 24   Ht 5\' 10"  (1.778 m)   Wt 90 kg   SpO2 100%   BMI 28.47 kg/m  Vital signs in last 24 hours: Temp:  [98 F (36.7 C)-101.3 F (38.5 C)] 100.8 F (38.2 C) (11/12 0400) Pulse Rate:  [83-120] 91 (11/12 0600) Resp:  [12-29] 24 (11/12 0600) BP: (94-216)/(39-85) 135/57 (11/12 0600) SpO2:  [94 %-100 %] 100 % (11/12 0734) FiO2 (%):  [40 %] 40 % (11/12 0734) Weight:  [87.7 kg-90 kg] 90 kg (11/12 0400)   General: lying in bed, NAD CVS: pulse-normal rate and rhythm RS: intubated, coarse breath sound BL Extremities: warm, +edema especially of the bilateral upper extremities Skin: Some scattered blisters, for example in the left antecubital fossa, 1 that has been abraided Neuro: Doesn't open eyes to noxious stimuli, left corneal opacity, right pupil reactive to light, right corneal reflex intact, slight VOR of the right eye, gag and cough reflex intact, grimace symmetric.  Grimaces to noxious stimulation in the bilateral upper extremities, has some spontaneous blinking movements and mouth movements that are nonrhythmic.  No movement of the extremities even to maximal noxious stimulation other than possible triple flexion of the left left lower extremity  Basic Metabolic Panel: Recent Labs  Lab 10/28/21 0916 10/28/21 1017 10/31/21 0851 11/01/21 0539 11/01/21 1414 11/02/21 0522 11/03/21 0521 11/03/21 1123 11/03/21 1653 11/04/21 0234  NA 137   < > 149*  147* 155* 151* 151* 157* 160* 159* 156*  K 5.4*   < > 3.0*  2.9* 4.1 3.3* 3.5 3.4*  --   --  3.1*  CL 109   < > 113*  111 119* 115* 119* 121*  --   --  120*  CO2 18*   < > 25  24 24 25 23 28   --   --  28  GLUCOSE 231*   < > 283*  281* 107* 179* 117* 103*  --   --  172*  BUN 29*   < > 45*  49* 43* 42* 42*  32*  --   --  36*  CREATININE 2.39*   < > 1.28*  1.30* 1.02 1.15 1.25* 1.06  --   --  1.37*  CALCIUM 7.5*   < > 9.0  8.8* 9.0 8.2* 8.8* 8.8*  --   --  8.7*  MG 2.1  --  2.0 2.4  --   --   --  2.4  --  2.5*  PHOS  --    < > 2.2* 2.8  --  3.0 3.4  --   --  3.0   < > = values in this interval not displayed.     CBC: Recent Labs  Lab 10/31/21 0851 11/01/21 0539 11/02/21 0522 11/03/21 0521 11/04/21 0234  WBC 12.3* 12.6* 21.4* 21.5* 16.2*  NEUTROABS  --   --   --   --  12.1*  HGB 8.9* 9.6* 10.1* 9.8* 9.0*  HCT 26.5* 28.3* 30.3* 29.3* 27.3*  MCV 83.3 81.8 82.1 82.5 85.0  PLT 167 179 206 226 253    Lab Results  Component Value Date   CHOL 110 05/12/2021   HDL 50 05/12/2021   LDLCALC 42 05/12/2021   TRIG 312 (H) 10/30/2021  CHOLHDL 2.2 05/12/2021   Lab Results  Component Value Date   HGBA1C 6.0 (H) 10/26/2021    Recent Labs       Lab Results  Component Value Date    TSH 0.639 08/25/2018      Ammonia 24 on 11/11  Coagulation Studies: No results for input(s): LABPROT, INR in the last 72 hours.  Imaging  ECHO reviewed  - Left ventricle: The cavity size was normal. There was moderate    concentric hypertrophy. Systolic function was normal. The    estimated ejection fraction was in the range of 60% to 65%. Wall    motion was normal; there were no regional wall motion    abnormalities. Features are consistent with a pseudonormal left    ventricular filling pattern, with concomitant abnormal relaxation    and increased filling pressure (grade 2 diastolic dysfunction).  - Aortic valve: There was no significant regurgitation.  - Mitral valve: There was no significant regurgitation.  - Left atrium: The atrium was mildly dilated.  - Right ventricle: Systolic function was normal.  - Atrial septum: No defect or patent foramen ovale was identified.  - Tricuspid valve: There was no significant regurgitation.  - Pulmonic valve: There was no significant regurgitation.  -  Inferior vena cava: The vessel was normal in size. The    respirophasic diameter changes were in the normal range (>= 50%),    consistent with normal central venous pressure.     MRI brain without contrast 11/02/2021: 1. Interval development of multiple scattered acute ischemic infarcts involving the bilateral cerebral hemispheres and left pons,largest measuring 4.9 cm at the right occipital lobe. A central thromboembolic etiology is suspected given the various vascular distributions involved. 2. Otherwise stable appearance of the brain with underlying chronic microvascular ischemic disease. Chronic encephalomalacia at the left frontal and right occipital lobes likely related to prior traumatic brain injury with coup/contrecoup pattern.       CTA head and neck 10/23/2021:  1.High-grade stenosis of the proximal left internal carotid artery at the bifurcation, measuring to 1.8 mm. 2. Bilateral high-grade stenoses within the cavernous internal carotid    arteries bilaterally. 3. High-grade stenosis or occlusion of the distal right vertebral artery at the dura. The vessel is reconstituted at the right PICA origin.  ASSESSMENT AND PLAN:  70 year old male with history of hypertension, peripheral vascular disease, cataract, tobacco abuse, left frontal and occipital traumatic contusion with small left frontal SDH in 2019, history of alcohol abuse who presented with focal seizures which have since resolved.  May be slightly improving today   Multiple acute infarcts New onset seizures Sacral ulcer Acute encephalopathy ( post-ictal, medications, infectious) -Unclear etiology. Ethanol level was negative on arrival, does have sacral ulcer and possible pneumonia which could be the provoking factors -Etiology for stroke: Suspected cardioembolic.  Could also be due to hypotension in a patient with significant intracranial stenosis -tPA not administered as no clear last known normal    Recommendations: -TEE to more definitively rule out intracardiac clot; non emergent and can be completed early next week -Continue DAPT due to intracranial stenosis.  Aspirin 81 mg daily as well as Plavix 75 mg daily for 3 months for secondary stroke prevention -Repeat lipid panel, d/c statin if LDL < 70 -Additional work-up for reversible causes of altered mental status: RPR, HIV, TSH, B12, MMA; has already been treated with high-dose thiamine this admission for an appropriate course and continues on daily supplementation -Goal Normotension -We will request to  check telemetry leads in case patient has paroxysmal atrial fibrillation -Continue Vimpat 200 mg twice daily, Keppra 500 mg twice daily (renal dosing) -If patient's mental status improves, vascular surgery consult for possible left carotid end arterectomy  -Management of rest of comorbidities per primary team -Continue seizure precautions -PRN IV Ativan 2 mg clinical seizure-like activity   I have spent a total of 40 minutes with the patient reviewing hospital notes,  test results, labs and examining the patient as well as establishing an assessment and plan.  > 50% of time was spent in direct patient care.   Lesleigh Noe MD-PhD [facility] [phone]  Available 7 AM to 7 PM, outside these hours please contact Neurologist on call listed on AMION

## 2021-11-04 NOTE — Progress Notes (Addendum)
NAME:  Jeffrey Mullen, MRN:  [id number], DOB:  02/22/1951, LOS: 12 ADMISSION DATE:  10/23/2021, CONSULTATION DATE:  10/31 REFERRING MD:  Dr. Dina Rich, CHIEF COMPLAINT:  AMS   History of Present Illness:  70 y/o M who presented to [facility] ER on 10/31 via EMS with reports of altered mental status.  Given  right sided gaze, altered and left sided weakness, code stoke called. The patient was taken emergently to CT for imaging which was negative for LVO.  While in the [facility], he had a witnessed seizure.    PCCM called for ICU admission.   Chart review shows he was seen in the ER on 10/4 after passing out at the [employer].  Physical exam was negative at the time.  Labs negative.  No further imaging obtained.   Pertinent  Medical History  Alcohol Use - unclear quantity Anxiety / Depression  Congenital blindness of left eye, cataract on R Hepatitis C+, treated in 2016 Seizures   Significant Hospital Events: Including procedures, antibiotic start and stop dates in addition to other pertinent events   10/31 Admit with AMS, seizure, hypertensive emergency, AKI 11/1 MRI brain no acute intracranial process. No etiology is seen for the patient's seizure. 11/02 bedside LP attempted but unsuccessful  11/03  No seizures seen on EEG overnight  11/04 weaning sedation to assess for seizure reoccurrence  11/9 bronchoscopy with noted thick secretions in LLL 11/11 Scheduled for trach  at 13:30, Cor Track placed, New Embolic Strokes per MRI 23/95 Post Trach Day 1, some oozing from site  Interim History / Subjective:  No events overnight.  Remains off all sedation  T Max llast 24 hours >> 101.3 Currently afebrile 98.9, WBC 16.2, Na 156, K of 3.1 getting repleted, Mag 2.5 Creatinine 1.37,( 1.06 on 11/11) BUN 36 from 32 LFT's are elevated, Alk Phos 338, Albumin 1.6, AST 177, ALT 103 ( All WNL when last checked 11/5 with exception of AST which was 51 at the time  Grimacing to pain, which he has not  previously been doing Tolerating CPAP/ PS 5/5 and 40% with good volumes and rate of 16 CXR with Stable bilateral lung opacities. Trach in good position Net + 12 L, 3 L urine output last 24 hours   Last Blood Cx 11/8 Pending Last sputum Cx ( BAL) >> 11/9 >> Pending  Tracheal Aspirate 11/8>> Normal Flora  MRI Brain 11/11 Interval development of multiple scattered acute ischemic infarcts involving the bilateral cerebral hemispheres and left pons, largest measuring 4.9 cm at the right occipital lobe. A central thromboembolic etiology is suspected given the various vascular distributions involved.  Echo 11/11 EF 60-65% LV Normal function, no regional wall motion abnormalities, Mild LV hypertrophy, Left ventricular diastolic parameters  consistent with Grade I diastolic dysfunction  RV systolic Function is normal, RV size is normal,Tricuspid regurgitation signal is inadequate for assessing  PA pressure. Possible PFO by doppler.>> Consider limited bubble study No Mitral Valve regurgitation noted, Aortic valve regurgitation not seen IVC normal size , > 50% respiratory variability, Suggests RAP of 3  Objective   Blood pressure (!) 135/57, pulse 91, temperature (!) 100.8 F (38.2 C), temperature source Oral, resp. rate (!) 24, height _0  (1.778 m), weight 90 kg, SpO2 100 %.    Vent Mode: PSV;CPAP FiO2 (%):  [40 %] 40 % Set Rate:  [26 bmp] 26 bmp Vt Set:  [440 mL] 440 mL PEEP:  [5 cmH20] 5 cmH20 Pressure Support:  [5 cmH20]  [place] Pressure:  [14 cmH20-17 cmH20] 14 cmH20   Intake/Output Summary (Last 24 hours) at 11/04/2021 0758 Last data filed at 11/04/2021 0700 Gross per 24 hour  Intake 4130.98 ml  Output 3025 ml  Net 1105.98 ml   Filed Weights   11/01/21 0500 11/03/21 1325 11/04/21 0400  Weight: 87.6 kg 87.7 kg 90 kg    Examination: General: Critically ill appearing middle-aged African-American male, intubated, off sedation , grimacing to pain , and occasional  blinking, but not to threat HEENT: ETT/CorTrack  in place, right 2 mm and reactive, cataract on left Neuro: eyes open today, no focus or tracking, extends bilaterally uppers with deep stimulation, does not move lowers, does not follow commands, very weak cough/gag CV: RRR, HR 94, no MRG  PULM: Bilateral chest excursion, Rhonchi upper left , coarse, thick tan secretions  GI: soft, active bowel sounds , ND, NT Extremities: warm/dry, diffuse 1+ edema upper and lower extremities, Feet are cool to touch with slow cap refill, and slight discoloration>> LE doppler with no evidence DVT Skin: skin tear left dorsal service, extravasation injury to dorsal side of left hand and arm. Dressing is clean dry and intact. 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   - Aspirin 81 mg daily as well as Plavix 75 mg daily for 3 months for secondary stroke prevention -Continue  atorvastatin 40 mg daily for secondary stroke prevention -Goal SBP <160, avoid hypotension. Embolic  strokes most likely happened more than 24 hours ago therefore no need for  permissive hypertension anymore. - Plan per neuro for interrogation of EKG to eval for atrial fibrillation - Trend Mag , Goal > 2 , replete as needed  Acute Hypoxic Respiratory Failure  RLL Pneumonia, Haemophilus influenzae Trach 11/11 >> Some site oozing 11/12 am Plan -Continue ventilator support with lung protective strategies  -Titrate FiO2 and PEEP with O2 sat  goal 92% (weaning on 5/5 )  -VAP bundle in place  -PAD protocol - CXR prn   AKI due to contrast-induced ATN Baseline sr cr ~0.9 to 1  Nontraumatic rhabdomyolysis Non-anion Gap Metabolic Acidosis Hypokalemia/hypernatremia due to aggressive diuresis  Hypophosphatemia Plan -Trend BMP -Continue D5W at 50 for Na 156 -Continue free water 200 cc's Q 4 -Continue to hold diuretics - Monitor UO -Avoid nephrotoxins -Trend BMET and Replace electrolytes as indicated   Febrile State  Leucocytosis  Plan -Trend WBC and Fever Curve - Re-culture as is clinically indicated - Follow Culture Data (BAL 11/9)  Prediabetes hemoglobin A1c 6.0 Hyperglycemia  Plan -Continue SSI q4 -4 units q4hrs for TF coverage  Vasopressor Extravasation Skin Injury - left hand/arm S/p phentolamine and nitroglycerin application Left Arm Skin Tear Unstageable sacral ulcer (POA) Plan -Appreciate wound care follow-up -Change posterior every 2 hours -Follow-up wound care recommendations  Uncontrolled hypertension Patient blood pressure remains uncontrolled Plan -Continue clonidine 0.3 mg 3 times daily and amlodipine 10 mg once daily, continue lisinopril 40 mg, add scheduled hydralazine  -Continue IV hydralazine and labetalol as needed  Complex social dynamics Patient's daughter does not want to be decision-maker for him, she made her mother to be the primary decision maker. Updated Lisa patient's Ex- significant other, consent obtained for bronchoscopy Trach done 11/11, weaning 11/12   Best Practice (right click and "Reselect all SmartList Selections" daily)   Diet/type: tubefeeds DVT prophylaxis: prophylactic heparin  GI prophylaxis: PPI Lines: N/A Foley:  foley in place Code Status:  full code  Total critical care time: 31 minutes  Critical care time was exclusive of separately billable procedures and treating other patients.   Critical care was necessary to treat or prevent imminent or  life-threatening deterioration.   Critical care was time spent personally by me on the following activities: development of treatment plan with patient and/or surrogate as well as nursing, discussions with consultants, evaluation of patient's response to treatment, examination of patient, obtaining history from patient or surrogate, ordering and performing treatments and interventions, ordering and review of laboratory studies, ordering and review of radiographic studies, pulse oximetry and re-evaluation of patient's condition.   Magdalen Spatz, MSN, AGACNP-BC [place] for personal pager PCCM on call pager [phone]   11/04/2021 7:58 AM

## 2021-11-04 NOTE — Evaluation (Signed)
Occupational Therapy Evaluation Patient Details Name: Jeffrey Mullen MRN: [id number] DOB: [birth year] Today's Date: 11/04/2021   History of Present Illness 70 y/o male presented to ED on 10/23/21 after bystanders witnessed patient collapse with associated seizure. Another seizure witnessed in ED. CT head negative. CT C-spine negative. LTM EEG on 11/1 showed 2 seizures from R anterior temporal region. Last seizure noted on EEG on 11/3. Intubated 10/31-11/11. Trach placed 11/11. 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 PMH: alcohol abuse, congenital blindness in L eye, bilateral glaucoma, depression, anxiety, HTN, seizures   Clinical Impression   PT admitted with Sz and CVA. Pt currently with functional limitiations due to the deficits listed below (see OT problem list). Pt currently total for all care on vent CPAP 40% FIO2 setting. Pt with only response being facial to painful stimuli L UE. Pt otherwise completely unresponsive to therapy. Therapy to follow at a distance checking on increased arousal 1 time per week.  Pt will benefit from skilled OT to increase their independence and safety with adls and balance to allow discharge LTACH OT .       Recommendations for follow up therapy are one component of a multi-disciplinary discharge planning process, led by the attending physician.  Recommendations may be updated based on patient status, additional functional criteria and insurance authorization.   Follow Up Recommendations  OT at Long-term acute care hospital    Assistance Recommended at Discharge Frequent or constant Supervision/Assistance  Functional Status Assessment  Patient has had a recent decline in their functional status and demonstrates the ability to make significant improvements in function in a reasonable and predictable amount of time.  Equipment  Recommendations  Wheelchair (measurements OT);Wheelchair cushion (measurements OT);Hospital bed;Other (comment) (lift)    Recommendations for Other Services Other (comment);Speech consult (palliative/)     Precautions / Restrictions Precautions Precautions: Fall Precaution Comments: trach, cortrak, seizure, blisters bilateral UEs, soft tissue injuries (bandaged) on L UE Restrictions Weight Bearing Restrictions: No      Mobility Bed Mobility Overal bed mobility: Needs Assistance             General bed mobility comments: placed in egress position with no increase in arousal. VSS    Transfers                   General transfer comment: not appropriate      Balance                                           ADL either performed or assessed with clinical judgement   ADL Overall ADL's : Needs assistance/impaired                                       General ADL Comments: total care     Vision   Additional Comments: manual opening pt eye on R and unable to sustain opening. pt with slight eye opening with increased HOB but only less than half     Perception     Praxis      Pertinent Vitals/Pain Pain Assessment: CPOT Facial Expression: Relaxed, neutral Body Movements: Absence of movements Muscle Tension: Relaxed Compliance with ventilator (intubated pts.): Tolerating ventilator  or movement Vocalization (extubated pts.): N/A CPOT Total: 0     Hand Dominance     Extremity/Trunk Assessment Upper Extremity Assessment Upper Extremity Assessment: RUE deficits/detail;LUE deficits/detail RUE Deficits / Details: blister MCP 2nd digit, noted to have weeping from wrist and edema present. no movement no response to pain RUE Coordination: decreased fine motor;decreased gross motor LUE Deficits / Details: facial response to painful stimuli , no movement edema noted LUE Coordination: decreased fine motor;decreased gross motor    Lower Extremity Assessment Lower Extremity Assessment: Defer to PT evaluation       Communication Communication Communication: Tracheostomy   Cognition Arousal/Alertness: Lethargic Behavior During Therapy: Flat affect Overall Cognitive Status: Difficult to assess                                 General Comments: grimaces to noxious stimuli on L UE. Not opening eyes to voice or pain initially. Glimpse of eye opening on R but spontaneous.     General Comments  vent CPAP 40% peep5 tolerating well BP DBP 54 sustaining    Exercises     Shoulder Instructions      Home Living Family/patient expects to be discharged to:: Unsure                                        Prior Functioning/Environment Prior Level of Function : Other (comment) (unsure - trach and not responding)                        OT Problem List: Decreased strength;Decreased activity tolerance;Impaired balance (sitting and/or standing);Decreased range of motion;Impaired vision/perception;Decreased coordination;Decreased cognition;Decreased safety awareness;Decreased knowledge of precautions;Decreased knowledge of use of DME or AE;Cardiopulmonary status limiting activity;Impaired sensation;Impaired tone;Obesity;Impaired UE functional use;Increased edema      OT Treatment/Interventions: Self-care/ADL training;Therapeutic exercise;Neuromuscular education;Energy conservation;DME and/or AE instruction;Manual therapy;Modalities;Splinting;Therapeutic activities;Cognitive remediation/compensation;Visual/perceptual remediation/compensation;Patient/family education;Balance training    OT Goals(Current goals can be found in the care plan section) Acute Rehab OT Goals OT Goal Formulation: Patient unable to participate in goal setting Time For Goal Achievement: 11/18/21 Potential to Achieve Goals: Fair  OT Frequency: Min 1X/week   Barriers to D/C:            Co-evaluation PT/OT/SLP  Co-Evaluation/Treatment: Yes Reason for Co-Treatment: Complexity of the patient's impairments (multi-system involvement);Necessary to address cognition/behavior during functional activity;For patient/therapist safety;To address functional/ADL transfers PT goals addressed during session: Strengthening/ROM OT goals addressed during session: ADL's and self-care;Proper use of Adaptive equipment and DME;Strengthening/ROM      AM-PAC OT "6 Clicks" Daily Activity     Outcome Measure Help from another person eating meals?: Total Help from another person taking care of personal grooming?: Total Help from another person toileting, which includes using toliet, bedpan, or urinal?: Total Help from another person bathing (including washing, rinsing, drying)?: Total Help from another person to put on and taking off regular upper body clothing?: Total Help from another person to put on and taking off regular lower body clothing?: Total 6 Click Score: 6   End of Session Equipment Utilized During Treatment: Oxygen Nurse Communication: Mobility status;Precautions  Activity Tolerance: Patient limited by lethargy Patient left: in bed;with call bell/phone within reach;with bed alarm set  OT Visit Diagnosis: Unsteadiness on feet (R26.81);Muscle weakness (generalized) (M62.81)  Time: 8315-1761 OT Time Calculation (min): 14 min Charges:  OT General Charges $OT Visit: 1 Visit OT Evaluation $OT Eval Moderate Complexity: 1 Mod  .sign   Jeri Modena 11/04/2021, 1:54 PM

## 2021-11-04 NOTE — Evaluation (Signed)
Physical Therapy Evaluation Patient Details Name: Jeffrey Mullen MRN: [id number] DOB: [birth year] Today's Date: 11/04/2021  History of Present Illness  70 y/o male presented to ED on 10/23/21 after bystanders witnessed patient collapse with associated seizure. Another seizure witnessed in ED. CT head negative. CT C-spine negative. LTM EEG on 11/1 showed 2 seizures from R anterior temporal region. Last seizure noted on EEG on 11/3. Intubated 10/31-11/11. Trach placed 11/11. 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 Patient not responding to voice or noxious stimuli except R facial grimace to noxious stimulus to L UE. Patient with no voluntary or spontaneous movement of all 4 extremities. Patient with trach on 40% FiO2 and 5 PEEP. Placed patient in egress position in bed with no increase in arousal state. Patient will benefit from skilled PT services once more alert. At this time, recommending follow up therapy at [facility] to progress mobility.      Recommendations for follow up therapy are one component of a multi-disciplinary discharge planning process, led by the attending physician.  Recommendations may be updated based on patient status, additional functional criteria and insurance authorization.  Follow Up Recommendations PT at [facility]    Assistance Recommended at Discharge Frequent or constant Supervision/Assistance  Functional Status Assessment Patient has had a recent decline in their functional status and/or demonstrates limited ability to make significant improvements in function in a reasonable and predictable amount of time  Equipment Recommendations  Other (comment) (TBD)     Recommendations for Other Services       Precautions / Restrictions Precautions Precautions: Fall Precaution Comments: trach, cortrak, seizure, blisters bilateral UEs, soft tissue injuries (bandaged) on L UE Restrictions Weight Bearing Restrictions: No      Mobility  Bed Mobility               General bed mobility comments: placed in egress position with no increase in arousal. VSS    Transfers                        Ambulation/Gait                  Stairs            Wheelchair Mobility    Modified Rankin (Stroke Patients Only) Modified Rankin (Stroke Patients Only) Pre-Morbid Rankin Score: No symptoms (assumption) Modified Rankin: Severe disability     Balance                                             Pertinent Vitals/Pain Pain Assessment: CPOT Facial Expression: Relaxed, neutral Body Movements: Absence of movements Muscle Tension: Relaxed Compliance with ventilator (intubated pts.): Tolerating ventilator or movement Vocalization (extubated pts.): N/A CPOT Total: 0    Home Living Family/patient expects to be discharged to:: Unsure                        Prior Function Prior Level of Function : Other (comment) (unsure - trach and not responding)  Hand Dominance        Extremity/Trunk Assessment   Upper Extremity Assessment Upper Extremity Assessment: Defer to OT evaluation    Lower Extremity Assessment Lower Extremity Assessment: Difficult to assess due to impaired cognition (not following commands. No response to painful stimuli bilaterally)       Communication   Communication: Tracheostomy  Cognition Arousal/Alertness: Lethargic Behavior During Therapy: Flat affect Overall Cognitive Status: Difficult to assess                                 General Comments: grimaces to noxious stimuli on L UE. Not opening eyes to voice or pain initially.  Glimpse of eye opening on R but spontaneous.        General Comments General comments (skin integrity, edema, etc.): on CPAP setting, 40% FiO2 and 5 PEEP    Exercises     Assessment/Plan    PT Assessment Patient needs continued PT services  PT Problem List Decreased strength;Decreased activity tolerance;Decreased mobility;Decreased balance;Decreased coordination;Decreased cognition;Decreased safety awareness;Decreased knowledge of use of DME;Decreased knowledge of precautions;Cardiopulmonary status limiting activity       PT Treatment Interventions DME instruction;Functional mobility training;Therapeutic activities;Therapeutic exercise;Balance training;Neuromuscular re-education;Patient/family education;Wheelchair mobility training    PT Goals (Current goals can be found in the Care Plan section)  Acute Rehab PT Goals Patient Stated Goal: unable to state PT Goal Formulation: Patient unable to participate in goal setting Time For Goal Achievement: 11/18/21 Potential to Achieve Goals: Fair    Frequency Min 1X/week (until increased alertness)   Barriers to discharge        Co-evaluation PT/OT/SLP Co-Evaluation/Treatment: Yes Reason for Co-Treatment: Complexity of the patient's impairments (multi-system involvement);For patient/therapist safety;Necessary to address cognition/behavior during functional activity PT goals addressed during session: Strengthening/ROM         AM-PAC PT "6 Clicks" Mobility  Outcome Measure Help needed turning from your back to your side while in a flat bed without using bedrails?: Total Help needed moving from lying on your back to sitting on the side of a flat bed without using bedrails?: Total Help needed moving to and from a bed to a chair (including a wheelchair)?: Total Help needed standing up from a chair using your arms (e.g., wheelchair or bedside chair)?: Total Help needed to walk in hospital room?: Total Help needed climbing 3-5 steps  with a railing? : Total 6 Click Score: 6    End of Session Equipment Utilized During Treatment: Oxygen Activity Tolerance: Patient limited by lethargy Patient left: in bed Nurse Communication: Mobility status;Other (comment) (level of arousal) PT Visit Diagnosis: Muscle weakness (generalized) (M62.81);Difficulty in walking, not elsewhere classified (R26.2);Other symptoms and signs involving the nervous system (Z61.096)    Time: 0454-0981 PT Time Calculation (min) (ACUTE ONLY): 14 min   Charges:   PT Evaluation $PT Eval Moderate Complexity: 1 Mod          Isaly Fasching A. Gilford Rile PT, DPT Acute Rehabilitation Services Pager [phone] Office [phone]   Linna Hoff 11/04/2021, 1:16 PM

## 2021-11-05 ENCOUNTER — Inpatient Hospital Stay (HOSPITAL_COMMUNITY): Payer: Medicare Other

## 2021-11-05 DIAGNOSIS — G40901 Epilepsy, unspecified, not intractable, with status epilepticus: Secondary | ICD-10-CM | POA: Diagnosis not present

## 2021-11-05 DIAGNOSIS — J9610 Chronic respiratory failure, unspecified whether with hypoxia or hypercapnia: Secondary | ICD-10-CM | POA: Diagnosis not present

## 2021-11-05 DIAGNOSIS — I639 Cerebral infarction, unspecified: Secondary | ICD-10-CM | POA: Diagnosis not present

## 2021-11-05 DIAGNOSIS — Z9911 Dependence on respirator [ventilator] status: Secondary | ICD-10-CM | POA: Diagnosis not present

## 2021-11-05 DIAGNOSIS — I6389 Other cerebral infarction: Secondary | ICD-10-CM | POA: Diagnosis not present

## 2021-11-05 LAB — COMPREHENSIVE METABOLIC PANEL
ALT: 119 U/L — ABNORMAL HIGH (ref 0–44)
AST: 171 U/L — ABNORMAL HIGH (ref 15–41)
Albumin: 1.6 g/dL — ABNORMAL LOW (ref 3.5–5.0)
Alkaline Phosphatase: 371 U/L — ABNORMAL HIGH (ref 38–126)
Anion gap: 7 (ref 5–15)
BUN: 25 mg/dL — ABNORMAL HIGH (ref 8–23)
CO2: 27 mmol/L (ref 22–32)
Calcium: 8.3 mg/dL — ABNORMAL LOW (ref 8.9–10.3)
Chloride: 119 mmol/L — ABNORMAL HIGH (ref 98–111)
Creatinine, Ser: 1.13 mg/dL (ref 0.61–1.24)
GFR, Estimated: >60 mL/min (ref >60)
Glucose, Bld: 159 mg/dL — ABNORMAL HIGH (ref 70–99)
Potassium: 3.5 mmol/L (ref 3.5–5.1)
Sodium: 153 mmol/L — ABNORMAL HIGH (ref 135–145)
Total Bilirubin: 0.7 mg/dL (ref 0.3–1.2)
Total Protein: 6 g/dL — ABNORMAL LOW (ref 6.5–8.1)

## 2021-11-05 LAB — BASIC METABOLIC PANEL
Anion gap: 7 (ref 5–15)
BUN: 25 mg/dL — ABNORMAL HIGH (ref 8–23)
CO2: 27 mmol/L (ref 22–32)
Calcium: 8.2 mg/dL — ABNORMAL LOW (ref 8.9–10.3)
Chloride: 118 mmol/L — ABNORMAL HIGH (ref 98–111)
Creatinine, Ser: 1.08 mg/dL (ref 0.61–1.24)
GFR, Estimated: >60 mL/min (ref >60)
Glucose, Bld: 107 mg/dL — ABNORMAL HIGH (ref 70–99)
Potassium: 3.5 mmol/L (ref 3.5–5.1)
Sodium: 152 mmol/L — ABNORMAL HIGH (ref 135–145)

## 2021-11-05 LAB — GLUCOSE, CAPILLARY
Glucose-Capillary: 155 mg/dL — ABNORMAL HIGH (ref 70–99)
Glucose-Capillary: 149 mg/dL — ABNORMAL HIGH (ref 70–99)
Glucose-Capillary: 105 mg/dL — ABNORMAL HIGH (ref 70–99)
Glucose-Capillary: 91 mg/dL (ref 70–99)
Glucose-Capillary: 110 mg/dL — ABNORMAL HIGH (ref 70–99)
Glucose-Capillary: 109 mg/dL — ABNORMAL HIGH (ref 70–99)

## 2021-11-05 LAB — RENAL FUNCTION PANEL
Albumin: 1.7 g/dL — ABNORMAL LOW (ref 3.5–5.0)
Anion gap: 7 (ref 5–15)
BUN: 25 mg/dL — ABNORMAL HIGH (ref 8–23)
CO2: 27 mmol/L (ref 22–32)
Calcium: 8.5 mg/dL — ABNORMAL LOW (ref 8.9–10.3)
Chloride: 119 mmol/L — ABNORMAL HIGH (ref 98–111)
Creatinine, Ser: 1.1 mg/dL (ref 0.61–1.24)
GFR, Estimated: >60 mL/min (ref >60)
Glucose, Bld: 163 mg/dL — ABNORMAL HIGH (ref 70–99)
Phosphorus: 3.6 mg/dL (ref 2.5–4.6)
Potassium: 3.6 mmol/L (ref 3.5–5.1)
Sodium: 153 mmol/L — ABNORMAL HIGH (ref 135–145)

## 2021-11-05 LAB — CBC WITH DIFFERENTIAL/PLATELET
Abs Immature Granulocytes: 0.17 10*3/uL — ABNORMAL HIGH (ref 0.00–0.07)
Basophils Absolute: 0.1 10*3/uL (ref 0.0–0.1)
Basophils Relative: 0 %
Eosinophils Absolute: 0.2 10*3/uL (ref 0.0–0.5)
Eosinophils Relative: 1 %
HCT: 26.5 % — ABNORMAL LOW (ref 39.0–52.0)
Hemoglobin: 8.6 g/dL — ABNORMAL LOW (ref 13.0–17.0)
Immature Granulocytes: 1 %
Lymphocytes Relative: 13 %
Lymphs Abs: 1.8 10*3/uL (ref 0.7–4.0)
MCH: 27.7 pg (ref 26.0–34.0)
MCHC: 32.5 g/dL (ref 30.0–36.0)
MCV: 85.2 fL (ref 80.0–100.0)
Monocytes Absolute: 0.7 10*3/uL (ref 0.1–1.0)
Monocytes Relative: 5 %
Neutro Abs: 10.4 10*3/uL — ABNORMAL HIGH (ref 1.7–7.7)
Neutrophils Relative %: 80 %
Platelets: 280 10*3/uL (ref 150–400)
RBC: 3.11 MIL/uL — ABNORMAL LOW (ref 4.22–5.81)
RDW: 16.2 % — ABNORMAL HIGH (ref 11.5–15.5)
WBC: 13.3 10*3/uL — ABNORMAL HIGH (ref 4.0–10.5)
nRBC: 0 % (ref 0.0–0.2)

## 2021-11-05 LAB — CULTURE, BLOOD (ROUTINE X 2)
Culture: NO GROWTH
Special Requests: ADEQUATE

## 2021-11-05 LAB — GAMMA GT: GGT: 816 U/L — ABNORMAL HIGH (ref 7–50)

## 2021-11-05 LAB — HIV ANTIBODY (ROUTINE TESTING W REFLEX): HIV Screen 4th Generation wRfx: NONREACTIVE

## 2021-11-05 LAB — AMMONIA: Ammonia: 17 umol/L (ref 9–35)

## 2021-11-05 LAB — RPR: RPR Ser Ql: NONREACTIVE

## 2021-11-05 MED ORDER — IPRATROPIUM-ALBUTEROL 0.5-2.5 (3) MG/3ML IN SOLN
3.0000 mL | Freq: Two times a day (BID) | RESPIRATORY_TRACT | Status: DC
  Administered 2021-11-05 – 2021-11-07 (×5): 3 mL via RESPIRATORY_TRACT
  Filled 2021-11-05 (×5): qty 3

## 2021-11-05 MED ORDER — FUROSEMIDE 10 MG/ML IJ SOLN
40.0000 mg | Freq: Once | INTRAMUSCULAR | Status: AC
  Administered 2021-11-05: 40 mg via INTRAVENOUS
  Filled 2021-11-05: qty 4

## 2021-11-05 MED ORDER — POTASSIUM CHLORIDE 20 MEQ PO PACK
40.0000 meq | PACK | Freq: Two times a day (BID) | ORAL | Status: AC
  Administered 2021-11-05 (×2): 40 meq
  Filled 2021-11-05 (×2): qty 2

## 2021-11-05 MED ORDER — GUAIFENESIN 100 MG/5ML PO LIQD
10.0000 mL | ORAL | Status: DC | PRN
  Administered 2021-11-09 – 2021-11-26 (×13): 10 mL
  Filled 2021-11-05: qty 10
  Filled 2021-11-05 (×3): qty 15
  Filled 2021-11-05: qty 10
  Filled 2021-11-05 (×8): qty 15

## 2021-11-05 MED ORDER — THIAMINE HCL 100 MG PO TABS
100.0000 mg | ORAL_TABLET | Freq: Every day | ORAL | Status: DC
Start: 2021-11-06 — End: 2021-12-05
  Administered 2021-11-06 – 2021-12-04 (×28): 100 mg
  Filled 2021-11-05 (×28): qty 1

## 2021-11-05 NOTE — Progress Notes (Addendum)
Subjective: Patient continues to be comatose.  BOF:BPZWCH to obtain due to poor mental status  Examination  Current vital signs: BP (!) 141/60   Pulse (!) 105   Temp (!) 100.6 F (38.1 C) (Axillary)   Resp 20   Ht 5\' 10"  (1.778 m)   Wt 90 kg   SpO2 98%   BMI 28.47 kg/m  Vital signs in last 24 hours: Temp:  [98.4 F (36.9 C)-100.6 F (38.1 C)] 100.6 F (38.1 C) (11/13 0800) Pulse Rate:  [81-105] 105 (11/13 0800) Resp:  [0-26] 20 (11/13 0800) BP: (91-141)/(43-68) 141/60 (11/13 0800) SpO2:  [95 %-100 %] 98 % (11/13 0800) FiO2 (%):  [40 %] 40 % (11/13 0728)   General: lying in bed, NAD CVS: pulse-normal rate and rhythm RS: intubated, coarse breath sound BL Extremities: warm, +edema especially of the bilateral upper extremities Skin: Some scattered blisters, for example in the left antecubital fossa, 1 that has been abraided Neuro: Slightly flickers eyes open to voice, left corneal opacity, right pupil reactive to light, right corneal reflex intact, slight VOR of the right eye but does not orient to examiner's voice, gag and cough reflex intact, grimace symmetric.  Grimaces to noxious stimulation in the bilateral upper extremities, left more briskly than right. Has some spontaneous blinking movements and mouth movements that are nonrhythmic.  No movement of the bilateral upper extremities even to maximal noxious stimulation   Basic Metabolic Panel: Recent Labs  Lab 10/31/21 0851 11/01/21 0539 11/01/21 1414 11/02/21 0522 11/03/21 0521 11/03/21 1123 11/03/21 1653 11/04/21 0234 11/04/21 1241 11/04/21 1824 11/04/21 2114 11/05/21 0600  NA 149*  147* 155*   < > 151* 157* 160*   < > 156* 153* 142 153* 153*  153*  K 3.0*  2.9* 4.1   < > 3.5 3.4*  --   --  3.1* 3.3*  --  3.7 3.5  3.6  CL 113*  111 119*   < > 119* 121*  --   --  120* 120*  --  118* 119*  119*  CO2 25  24 24    < > 23 28  --   --  28 27  --  27 27  27   GLUCOSE 283*  281* 107*   < > 117* 103*  --   --   172* 159*  --  147* 159*  163*  BUN 45*  49* 43*   < > 42* 32*  --   --  36* 32*  --  28* 25*  25*  CREATININE 1.28*  1.30* 1.02   < > 1.25* 1.06  --   --  1.37* 1.14  --  1.13 1.13  1.10  CALCIUM 9.0  8.8* 9.0   < > 8.8* 8.8*  --   --  8.7* 8.3*  --  8.4* 8.3*  8.5*  MG 2.0 2.4  --   --   --  2.4  --  2.5*  --   --   --   --   PHOS 2.2* 2.8  --  3.0 3.4  --   --  3.0  --   --   --  3.6   < > = values in this interval not displayed.     CBC: Recent Labs  Lab 11/01/21 0539 11/02/21 0522 11/03/21 0521 11/04/21 0234 11/05/21 0600  WBC 12.6* 21.4* 21.5* 16.2* 13.3*  NEUTROABS  --   --   --  12.1* 10.4*  HGB 9.6* 10.1* 9.8*  9.0* 8.6*  HCT 28.3* 30.3* 29.3* 27.3* 26.5*  MCV 81.8 82.1 82.5 85.0 85.2  PLT 179 206 226 253 280    Lab Results  Component Value Date   CHOL 126 11/04/2021   HDL 13 (L) 11/04/2021   LDLCALC 66 11/04/2021   TRIG 237 (H) 11/04/2021   CHOLHDL 9.7 11/04/2021   Lab Results  Component Value Date   HGBA1C 6.0 (H) 10/26/2021    Recent Labs       Lab Results  Component Value Date    TSH 0.639 08/25/2018      Ammonia 24 on 11/11  Coagulation Studies: No results for input(s): LABPROT, INR in the last 72 hours.  Imaging  ECHO reviewed  - Left ventricle: The cavity size was normal. There was moderate    concentric hypertrophy. Systolic function was normal. The    estimated ejection fraction was in the range of 60% to 65%. Wall    motion was normal; there were no regional wall motion    abnormalities. Features are consistent with a pseudonormal left    ventricular filling pattern, with concomitant abnormal relaxation    and increased filling pressure (grade 2 diastolic dysfunction).  - Aortic valve: There was no significant regurgitation.  - Mitral valve: There was no significant regurgitation.  - Left atrium: The atrium was mildly dilated.  - Right ventricle: Systolic function was normal.  - Atrial septum: No defect or patent foramen ovale  was identified.  - Tricuspid valve: There was no significant regurgitation.  - Pulmonic valve: There was no significant regurgitation.  - Inferior vena cava: The vessel was normal in size. The    respirophasic diameter changes were in the normal range (>= 50%),    consistent with normal central venous pressure.     MRI brain without contrast 11/02/2021: 1. Interval development of multiple scattered acute ischemic infarcts involving the bilateral cerebral hemispheres and left pons,largest measuring 4.9 cm at the right occipital lobe. A central thromboembolic etiology is suspected given the various vascular distributions involved. 2. Otherwise stable appearance of the brain with underlying chronic microvascular ischemic disease. Chronic encephalomalacia at the left frontal and right occipital lobes likely related to prior traumatic brain injury with coup/contrecoup pattern.       CTA head and neck 10/23/2021:  1.High-grade stenosis of the proximal left internal carotid artery at the bifurcation, measuring to 1.8 mm. 2. Bilateral high-grade stenoses within the cavernous internal carotid    arteries bilaterally. 3. High-grade stenosis or occlusion of the distal right vertebral artery at the dura. The vessel is reconstituted at the right PICA origin.  ASSESSMENT AND PLAN:  70 year old male with history of hypertension, peripheral vascular disease, cataract, tobacco abuse, left frontal and occipital traumatic contusion with small left frontal SDH in 2019, history of alcohol abuse who presented with focal seizures which have since resolved.    Exam does seem to be slightly improving today, suspected gradually resolving postictal state/metabolic encephalopathy.  Regarding his elevated LFTs, statin may be playing a role and this can be discontinued given his LDL is already at goal.  Given he has persistently had fevers will additionally obtain blood culture to potentially identify an organism for  bacteremia/possible endocarditis in the setting of his multifocal embolic strokes of unknown etiology.  Confirmed with CCM team the patient has not had any significant procedures that would put him at risk of central embolic strokes.   Multiple acute infarcts New onset seizures, resolved Sacral ulcer Acute encephalopathy (  post-ictal, medications, infectious) -Unclear etiology. Ethanol level was negative on arrival, does have sacral ulcer and possible pneumonia which could be the provoking factors -Etiology for stroke: Suspected cardioembolic.  Could also be due to hypotension in a patient with significant intracranial stenosis -tPA not administered as no clear last known normal   Recommendations: -TEE to more definitively rule out intracardiac clot; non emergent and can be completed early next week -Continue aspirin 81 mg daily -Hold Plavix due to concern for potential endocarditis as an etiology; may be added if TEE is negative for a 90-day course -d/c statin given LDL at goal < 70 and rising LFTs; further workup per CCM -Additional work-up for reversible causes of altered mental status: RPR, HIV, TSH, B12, MMA; has already been treated with high-dose thiamine this admission for an appropriate course and continues on daily supplementation -Blood cultures for potential endocarditis workup -Goal Normotension -We will request to check telemetry leads in case patient has paroxysmal atrial fibrillation -Continue Vimpat 200 mg twice daily, Keppra 500 mg twice daily (renal dosing) -If patient's mental status improves, vascular surgery consult for possible left carotid end arterectomy  -Management of rest of comorbidities per primary team -Continue seizure precautions -PRN IV Ativan 2 mg clinical seizure-like activity -Stroke team to follow tomorrow   I have spent a total of 35 minutes with the patient reviewing hospital notes,  test results, labs and examining the patient as well as establishing  an assessment and plan.  > 50% of time was spent in direct patient care.   Lesleigh Noe MD-PhD [facility] [phone]  Available 7 AM to 7 PM, outside these hours please contact Neurologist on call listed on AMION

## 2021-11-05 NOTE — Progress Notes (Addendum)
NAME:  Jeffrey Mullen, MRN:  [id number], DOB:  06-01-1951, LOS: 47 ADMISSION DATE:  10/23/2021, CONSULTATION DATE:  10/31 REFERRING MD:  Dr. Dina Rich, CHIEF COMPLAINT:  AMS   History of Present Illness:  70 y/o M who presented to [facility] ER on 10/31 via EMS with reports of altered mental status.  Given  right sided gaze, altered and left sided weakness, code stoke called. The patient was taken emergently to CT for imaging which was negative for LVO.  While in the [place], he had a witnessed seizure.    PCCM called for ICU admission.   Chart review shows he was seen in the ER on 10/4 after passing out at the [employer].  Physical exam was negative at the time.  Labs negative.  No further imaging obtained.   Pertinent  Medical History  Alcohol Use - unclear quantity Anxiety / Depression  Congenital blindness of left eye, cataract on R Hepatitis C+, treated in 2016 Seizures   Significant Hospital Events: Including procedures, antibiotic start and stop dates in addition to other pertinent events   10/31 Admit with AMS, seizure, hypertensive emergency, AKI 11/1 MRI brain no acute intracranial process. No etiology is seen for the patient's seizure. 11/02 bedside LP attempted but unsuccessful  11/03  No seizures seen on EEG overnight  11/04 weaning sedation to assess for seizure reoccurrence  11/9 bronchoscopy with noted thick secretions in LLL 11/11 Trach,  Cor Track placed, New Embolic Strokes per MRI 18/56 Post Trach Day 1, some oozing from site, Echo results noted below 11/13 Low grade fever, tachy, LFT's remain elevated with ALT and Alk Phos continuing to rise  Interim History / Subjective:  No events overnight.  Remains off all sedation  T Max llast 24 hours >> 100.6 Currently  low grade temp at 100.6>> WBC 13.3 ( 16.2), HGB 8.6 ( 9.0), platelets 280( 253) Na 153, K of 3.5 getting repleted, Mag 2.5 on 11/12 Creatinine 1.13, BUN 25 >> slowly correcting Calcium corrects to 10.2  with albumin of 1.6 LFT's are elevated, Alk Phos 371(338), Albumin 1.6, AST 171(177), ALT ( 119)103 ( All WNL when last checked 11/5 with exception of AST which was 51 at the time  Grimacing to pain, and blinking to threat 11/13 ( was not doing this 11/12) Tolerating CPAP/ PS 8/5 and 40% with good volumes and rate of 23 Net + 13.5 L, 2800 cc   urine output last 24 hours   Last Blood Cx 11/8 No growth  Last sputum Cx ( BAL) >> 11/9 >> No growth Tracheal Aspirate 11/8>> Normal Flora  MRI Brain 11/11 Interval development of multiple scattered acute ischemic infarcts involving the bilateral cerebral hemispheres and left pons, largest measuring 4.9 cm at the right occipital lobe. A central thromboembolic etiology is suspected given the various vascular distributions involved.  Echo 11/11 ECHO reviewed  - Left ventricle: The cavity size was normal. There was moderate    concentric hypertrophy. Systolic function was normal. The    estimated ejection fraction was in the range of 60% to 65%. Wall    motion was normal; there were no regional wall motion    abnormalities. Features are consistent with a pseudonormal left    ventricular filling pattern, with concomitant abnormal relaxation    and increased filling pressure (grade 2 diastolic dysfunction).  - Aortic valve: There was no significant regurgitation.  - Mitral valve: There was no significant regurgitation.  - Left atrium: The atrium was mildly  dilated.  - Right ventricle: Systolic function was normal.  - Atrial septum: No defect or patent foramen ovale was identified.  - Tricuspid valve: There was no significant regurgitation.  - Pulmonic valve: There was no significant regurgitation.  - Inferior vena cava: The vessel was normal in size. The    respirophasic diameter changes were in the normal range (>= 50%),    consistent with normal central venous pressure.   Objective   Blood pressure (!) 141/60, pulse (!) 105, temperature (!)  100.6 F (38.1 C), temperature source Axillary, resp. rate 20, height _0  (1.778 m), weight 90 kg, SpO2 98 %.    Vent Mode: PSV;CPAP FiO2 (%):  [40 %] 40 % Set Rate:  [26 bmp] 26 bmp Vt Set:  [440 mL] 440 mL PEEP:  [5 cmH20] 5 cmH20 Pressure Support:  [5 cmH20-8 cmH20] 8 cmH20 Plateau Pressure:  [11 cmH20-15 cmH20] 15 cmH20   Intake/Output Summary (Last 24 hours) at 11/05/2021 1941 Last data filed at 11/05/2021 0815 Gross per 24 hour  Intake 3729.75 ml  Output 2225 ml  Net 1504.75 ml   Filed Weights   11/01/21 0500 11/03/21 1325 11/04/21 0400  Weight: 87.6 kg 87.7 kg 90 kg    Examination: General: Critically ill appearing middle-aged African-American male, intubated, off sedation , on CPAP/PS , in NAD HEENT: Trach /CorTrack  in place, right 2 mm and reactive, cataract on left, Trach with minimal oozing, more crusted  Neuro: eyes open today, blinks to threat, no focus or tracking, extends bilaterally uppers with deep stimulation, does not move lowers, does not follow commands, very weak cough/gag CV: RRR, HR 109, no MRG  PULM: Bilateral chest excursion, Rhonchi upper left , coarse, thick tan secretions  GI: soft, active bowel sounds , ND, NT Extremities: warm/dry, diffuse 1+ edema upper and lower extremities, Feet are cool to touch with slow cap refill, and slight discoloration>> LE doppler with no evidence DVT Skin: skin tear left dorsal service, extravasation injury to dorsal side of left hand and arm. Dressing is clean dry and intact. 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   - Aspirin 81 mg daily as well as Plavix 75 mg daily for 3 months for secondary stroke prevention -LDL is 66, so suspect neuro will d/c statin today ( Goal was < 70) -Goal SBP <160, avoid hypotension. Embolic  strokes most likely happened 24 hours prior to MRI brain, therefore no need for  permissive hypertension anymore. - Plan per neuro for interrogation of EKG to eval for atrial fibrillation - Plan for TEE, non-urgent, please consult cardiology Monday 11/14 2022  AM for TEE to evaluate for intracardiac clot ( looking for source of new acute embolic strokes) - Trend Mag , Goal > 2 , replete as needed  Acute Hypoxic Respiratory Failure  RLL Pneumonia, Haemophilus influenzae Trach 11/11 >> site more crusty than oozy 11/13, no redness or drainage Plan -Continue ventilator support with lung protective  strategies  -Titrate FiO2 and PEEP with O2 sat goal 92% (weaning on 8/5 )  -VAP bundle in place  -PAD protocol - CXR prn - Trach care - Will add Mucinex per tube to thin secretions  - Consider bed chest percussion after TEE has ruled out intracardiac clot - Consider gentle diuresis> Lasix 40 today  AKI due to contrast-induced ATN Baseline sr cr ~0.9 to 1  Nontraumatic rhabdomyolysis Non-anion Gap Metabolic Acidosis Hypokalemia/hypernatremia due to aggressive diuresis  Hypophosphatemia Calcium corrects to 10.2 with albumin Plan -Trend BMP - D5W discontinued 11/12 - Continue free water 200 cc's Q 4 - Consider gentle diuresis - Monitor UO - Avoid nephrotoxins -Trend BMET and Replace electrolytes as indicated  - Will check ionized calcium in am  Febrile State  Mild Leucocytosis >> down trending >> ?neuro fever Plan -Trend WBC and Fever Curve - Continue Rocephin as WBC down trending since initiated -  Re-culture as is clinically indicated>> will re-culture sputum - Culture data thus far is negative  Elevated LFT's  Total Bili normal ? etiology Plan Will check GGT and RUQ limited US  Prediabetes hemoglobin A1c 6.0 Hyperglycemia  Plan -Continue SSI q4 -4 units q4hrs for TF coverage  Vasopressor Extravasation Skin Injury - left hand/arm S/p phentolamine and nitroglycerin application Left Arm Skin Tear>> skin is sluffing with pink granulating tissue beneath Unstageable sacral ulcer (POA)>> Progression despite topical barrier treatment  Plan -Change position  every 2 hours - Continue dressing changes to L forearm - Will re-consult wound for progression of sacral wound to determine need for change in therapy    Uncontrolled Hypertension Labile BP at times  Plan - clonidine decreased to 0.1 mg 3 times daily and amlodipine 10 mg once daily, continue lisinopril 40 mg, and  scheduled hydralazine ( which has also been decreased) due to labile BP) -Continue prn  IV hydralazine and labetalol as needed   Complex social dynamics Patient's daughter does not want to be decision-maker for him, she made her mother to be the primary decision maker. Updated Lisa patient's Ex- significant other, consent obtained for bronchoscopy Trach done 11/11, weaning 11/13   Best Practice (right click and "Reselect all SmartList Selections" daily)   Diet/type: tubefeeds DVT prophylaxis: prophylactic heparin  GI prophylaxis: PPI Lines: N/A Foley:  foley in place Code Status:  full code  Total critical care time: 35 minutes  Critical care time was exclusive of separately billable procedures and treating other patients.   Critical care was necessary to treat or prevent imminent or life-threatening deterioration.   Critical care was time spent personally by me on the following activities: development of treatment plan with patient and/or surrogate as well as nursing, discussions with consultants,  evaluation of patient's response to treatment, examination of patient, obtaining history from patient or surrogate, ordering and performing treatments and interventions, ordering and review of laboratory studies, ordering and review of radiographic studies, pulse oximetry and re-evaluation of patient's condition.   Magdalen Spatz, MSN, AGACNP-BC [place] for personal pager PCCM on call pager [phone]   11/05/2021 8:24 AM  Attending Attestation: I have taken an interval history, reviewed the chart and examined the patient. I agree with the Advanced Practitioner's note, impression, and recommendations as outlined.   Briefly, , 70 year old male history alcohol use, seizures, congenital blindness of left eye nd anxiety/depression who was admitted for AMS, witnessed seizure, hypertensive emergency and AKI. Underwent bronchoscopy 11/9. S/p trach 11/11.  S:  Tmax 100.6. No events overnight.  Blood pressure 135/62, pulse (!) 106, temperature (!) 100.6 F (38.1 C), temperature source Axillary, resp. rate 20, height 5' 10" (1.778 m), weight 90 kg, SpO2 97 %. On exam, critically ill male. Opens eyes to touch. No tracking Right pupil 15m reactive. Left eye blindness. Grimaces to noxious stimuli. Trach in place c/d/I. Lungs coarse breath sounds bilaterally, no wheezes. Heart RRR, no murmur.   Na 153, improved Cr normalized LFTs mildly elevated. Normal T. bili   MRI brain 11/11 - Interval development of multiple scattered acute ischemic infarcts involving the bilateral cerebral hemispheres and left pons, largest measuring 4.9 cm at the right occipital lobe.   Acute embolic stroke Status epilepticus Alcohol abuse with prior SDH MRI neg. No seizure since 11/3. -Appreciate Neuro input. Recommend lab work-up and TEE. Will consult cardiology on 11/14 (Monday) for TEE. -ASA, Plavix -Continue lacosamide, Keppra -Daily thiamine  Acute hypoxemic  respiratory failure 2/2 H. Influenzae pneumonia Failure to wean from ventilator s/p trach 11/11 Volume overload Trach aspirate 10/4 + H. Influenzae. Completed antibiotic course --Continue full vent support --SBT/WUA daily.  Mental status poor --VAP --Diurese for goal net negative   Hypernatremia - improving --Continue FWF --Trend Na q12h    HTN, labile - improved --Amlodipine, clonidine TID, hydralazine, HCTZ, lisinopril   Elevated LFTs, mild --Trend  The patient is critically ill with multiple organ systems failure and requires high complexity decision making for assessment and support, frequent evaluation and titration of therapies, application of advanced monitoring technologies and extensive interpretation of multiple databases.  Independent Critical Care Time: 40 Minutes.   JRodman Pickle M.D. LAver[facility] Holy Family HospitalPulmonary/Critical Care Medicine 11/05/2021 9:50 AM   Please see Amion for pager number to reach on-call Pulmonary and Critical Care Team.

## 2021-11-06 ENCOUNTER — Inpatient Hospital Stay (HOSPITAL_COMMUNITY): Payer: Medicare Other

## 2021-11-06 DIAGNOSIS — G9340 Encephalopathy, unspecified: Secondary | ICD-10-CM | POA: Diagnosis not present

## 2021-11-06 DIAGNOSIS — J9601 Acute respiratory failure with hypoxia: Secondary | ICD-10-CM

## 2021-11-06 DIAGNOSIS — J9602 Acute respiratory failure with hypercapnia: Secondary | ICD-10-CM

## 2021-11-06 DIAGNOSIS — R569 Unspecified convulsions: Secondary | ICD-10-CM | POA: Diagnosis not present

## 2021-11-06 LAB — PHOSPHORUS: Phosphorus: 3.4 mg/dL (ref 2.5–4.6)

## 2021-11-06 LAB — GLUCOSE, CAPILLARY
Glucose-Capillary: 111 mg/dL — ABNORMAL HIGH (ref 70–99)
Glucose-Capillary: 118 mg/dL — ABNORMAL HIGH (ref 70–99)
Glucose-Capillary: 121 mg/dL — ABNORMAL HIGH (ref 70–99)
Glucose-Capillary: 128 mg/dL — ABNORMAL HIGH (ref 70–99)
Glucose-Capillary: 105 mg/dL — ABNORMAL HIGH (ref 70–99)
Glucose-Capillary: 123 mg/dL — ABNORMAL HIGH (ref 70–99)

## 2021-11-06 LAB — COMPREHENSIVE METABOLIC PANEL
ALT: 126 U/L — ABNORMAL HIGH (ref 0–44)
AST: 164 U/L — ABNORMAL HIGH (ref 15–41)
Albumin: 1.8 g/dL — ABNORMAL LOW (ref 3.5–5.0)
Alkaline Phosphatase: 518 U/L — ABNORMAL HIGH (ref 38–126)
Anion gap: 7 (ref 5–15)
BUN: 24 mg/dL — ABNORMAL HIGH (ref 8–23)
CO2: 28 mmol/L (ref 22–32)
Calcium: 8.8 mg/dL — ABNORMAL LOW (ref 8.9–10.3)
Chloride: 118 mmol/L — ABNORMAL HIGH (ref 98–111)
Creatinine, Ser: 0.99 mg/dL (ref 0.61–1.24)
GFR, Estimated: >60 mL/min (ref >60)
Glucose, Bld: 135 mg/dL — ABNORMAL HIGH (ref 70–99)
Potassium: 3.8 mmol/L (ref 3.5–5.1)
Sodium: 153 mmol/L — ABNORMAL HIGH (ref 135–145)
Total Bilirubin: 0.8 mg/dL (ref 0.3–1.2)
Total Protein: 6.8 g/dL (ref 6.5–8.1)

## 2021-11-06 LAB — URINALYSIS, ROUTINE W REFLEX MICROSCOPIC
Bilirubin Urine: NEGATIVE
Glucose, UA: NEGATIVE mg/dL
Hgb urine dipstick: NEGATIVE
Ketones, ur: NEGATIVE mg/dL
Leukocytes,Ua: NEGATIVE
Nitrite: NEGATIVE
Protein, ur: NEGATIVE mg/dL
Specific Gravity, Urine: 1.012 (ref 1.005–1.030)
pH: 7 (ref 5.0–8.0)

## 2021-11-06 LAB — CBC WITH DIFFERENTIAL/PLATELET
Abs Immature Granulocytes: 0.16 10*3/uL — ABNORMAL HIGH (ref 0.00–0.07)
Basophils Absolute: 0.1 10*3/uL (ref 0.0–0.1)
Basophils Relative: 1 %
Eosinophils Absolute: 0.2 10*3/uL (ref 0.0–0.5)
Eosinophils Relative: 1 %
HCT: 28 % — ABNORMAL LOW (ref 39.0–52.0)
Hemoglobin: 9.1 g/dL — ABNORMAL LOW (ref 13.0–17.0)
Immature Granulocytes: 1 %
Lymphocytes Relative: 12 %
Lymphs Abs: 2 10*3/uL (ref 0.7–4.0)
MCH: 28 pg (ref 26.0–34.0)
MCHC: 32.5 g/dL (ref 30.0–36.0)
MCV: 86.2 fL (ref 80.0–100.0)
Monocytes Absolute: 1 10*3/uL (ref 0.1–1.0)
Monocytes Relative: 6 %
Neutro Abs: 12.7 10*3/uL — ABNORMAL HIGH (ref 1.7–7.7)
Neutrophils Relative %: 79 %
Platelets: 348 10*3/uL (ref 150–400)
RBC: 3.25 MIL/uL — ABNORMAL LOW (ref 4.22–5.81)
RDW: 16.4 % — ABNORMAL HIGH (ref 11.5–15.5)
WBC: 16.1 10*3/uL — ABNORMAL HIGH (ref 4.0–10.5)
nRBC: 0 % (ref 0.0–0.2)

## 2021-11-06 LAB — MAGNESIUM: Magnesium: 2.3 mg/dL (ref 1.7–2.4)

## 2021-11-06 MED ORDER — INSULIN ASPART 100 UNIT/ML IJ SOLN
2.0000 [IU] | INTRAMUSCULAR | Status: DC
  Administered 2021-11-06 (×4): 2 [IU] via SUBCUTANEOUS

## 2021-11-06 MED ORDER — JEVITY 1.5 CAL/FIBER PO LIQD
1000.0000 mL | ORAL | Status: DC
  Administered 2021-11-06 – 2021-11-13 (×7): 1000 mL
  Filled 2021-11-06 (×10): qty 1000

## 2021-11-06 MED ORDER — COLLAGENASE 250 UNIT/GM EX OINT
TOPICAL_OINTMENT | Freq: Two times a day (BID) | CUTANEOUS | Status: DC
  Administered 2021-11-12 – 2021-12-10 (×9): 1 via TOPICAL
  Filled 2021-11-06 (×7): qty 30

## 2021-11-06 MED ORDER — JUVEN PO PACK
1.0000 | PACK | Freq: Two times a day (BID) | ORAL | Status: DC
  Administered 2021-11-08 – 2021-12-04 (×54): 1
  Filled 2021-11-06 (×56): qty 1

## 2021-11-06 MED ORDER — PROSOURCE TF PO LIQD
90.0000 mL | Freq: Three times a day (TID) | ORAL | Status: DC
  Administered 2021-11-06 – 2021-11-18 (×33): 90 mL
  Filled 2021-11-06 (×34): qty 90

## 2021-11-06 NOTE — Progress Notes (Addendum)
STROKE TEAM PROGRESS NOTE   SUBJECTIVE (INTERVAL HISTORY) No family present at this time. Patient continues to be in a near comatose state. Pending TEE.    OBJECTIVE Vitals:   11/06/21 1100 11/06/21 1141 11/06/21 1142 11/06/21 1200  BP: (!) 145/62 (!) 145/62  (!) 121/49  Pulse: (!) 102 100  (!) 101  Resp: 17 20  (!) 22  Temp:    100.1 F (37.8 C)  TempSrc:    Axillary  SpO2: 100% 98% 98% 98%  Weight:      Height:        CBC:  Recent Labs  Lab 11/05/21 0600 11/06/21 0615  WBC 13.3* 16.1*  NEUTROABS 10.4* 12.7*  HGB 8.6* 9.1*  HCT 26.5* 28.0*  MCV 85.2 86.2  PLT 280 562    Basic Metabolic Panel:  Recent Labs  Lab 11/04/21 0234 11/04/21 1241 11/05/21 0600 11/05/21 1653 11/06/21 0615  NA 156*   < > 153*  153* 152* 153*  K 3.1*   < > 3.5  3.6 3.5 3.8  CL 120*   < > 119*  119* 118* 118*  CO2 28   < > 27  27 27 28   GLUCOSE 172*   < > 159*  163* 107* 135*  BUN 36*   < > 25*  25* 25* 24*  CREATININE 1.37*   < > 1.13  1.10 1.08 0.99  CALCIUM 8.7*   < > 8.3*  8.5* 8.2* 8.8*  MG 2.5*  --   --   --  2.3  PHOS 3.0  --  3.6  --  3.4   < > = values in this interval not displayed.    Lipid Panel:  Recent Labs  Lab 11/04/21 1243  CHOL 126  TRIG 237*  HDL 13*  CHOLHDL 9.7  VLDL 47*  LDLCALC 66   HgbA1c:  Lab Results  Component Value Date   HGBA1C 6.0 (H) 10/26/2021   Urine Drug Screen:     Component Value Date/Time   LABOPIA NONE DETECTED 10/23/2021 1520   COCAINSCRNUR NONE DETECTED 10/23/2021 1520   LABBENZ NONE DETECTED 10/23/2021 1520   AMPHETMU NONE DETECTED 10/23/2021 1520   THCU NONE DETECTED 10/23/2021 1520   LABBARB NONE DETECTED 10/23/2021 1520    Alcohol Level     Component Value Date/Time   ETH <10 10/23/2021 1618    IMAGING  Results for orders placed or performed during the hospital encounter of 10/23/21  MR BRAIN W WO CONTRAST   Narrative   CLINICAL DATA:  Seizure, nontraumatic  EXAM: MRI HEAD WITHOUT AND WITH  CONTRAST  TECHNIQUE: Multiplanar, multiecho pulse sequences of the brain and surrounding structures were obtained without and with intravenous contrast.  CONTRAST:  72mL GADAVIST GADOBUTROL 1 MMOL/ML IV SOLN  COMPARISON:  No prior MRI, correlation is made with CT head 10/23/2021  FINDINGS: Brain: No acute infarction, hemorrhage, hydrocephalus, extra-axial collection or mass lesion. No abnormal enhancement. T2 hyperintense signal in the periventricular white matter, likely the sequela of chronic small vessel ischemic disease. Left frontal encephalomalacia, related to remote intraparenchymal hemorrhage. Redemonstrated lacunar infarcts in the bilateral basal ganglia.  Vascular: Normal flow voids.  Skull and upper cervical spine: Normal marrow signal.  Sinuses/Orbits: Elongated left globe, which appears stable compared to remote exams. Status post bilateral lens replacements. The paranasal sinuses are clear.  Other: Mild fluid throughout the bilateral mastoid air cells.  IMPRESSION: No acute intracranial process. No etiology is seen for the patient's seizure.  Electronically Signed   By: Merilyn Baba M.D.   On: 10/24/2021 18:53   MR BRAIN WO CONTRAST   Narrative   CLINICAL DATA:  Initial evaluation for mental status change.  EXAM: MRI HEAD WITHOUT CONTRAST  TECHNIQUE: Multiplanar, multiecho pulse sequences of the brain and surrounding structures were obtained without intravenous contrast.  COMPARISON:  Comparison made with prior MRI from 10/24/2021  FINDINGS: Brain: Mild age-related cerebral atrophy. Patchy and confluent T2/FLAIR hyperintensity involving the periventricular and deep white matter both cerebral hemispheres most consistent with chronic small vessel ischemic disease, mild in nature. Encephalomalacia and gliosis at the anterior/inferior left frontal lobe likely related to prior trauma. Mild cephalo malacia with chronic hemosiderin staining at the  posterior right occipital region likely related to prior trauma as well.  There has been interval development of multiple scattered ischemic infarcts involving the bilateral cerebral hemispheres and left pons. The largest area of infarction is present at the right occipital lobe and measures 4.9 cm, right PCA distribution (series 5, image 77). Mild petechial blood products present at this location. The left pontine infarct measures 1.1 cm (series 5, image 72). The remaining infarcts are largely subcentimeter in size and subcortical in location. Patchy involvement of the bilateral basal ganglia noted. No other associated hemorrhage or mass effect.  No mass lesion or significant mass effect. No midline shift or hydrocephalus. No extra-axial fluid collection. Pituitary gland suprasellar region normal. Midline structures intact.  Vascular: Major intracranial vascular flow voids are maintained.  Skull and upper cervical spine: Craniocervical junction within normal limits. Diffusely decreased T1 signal intensity within the bone marrow of the visualized upper cervical spine, nonspecific, but most commonly related to anemia, smoking from PCD. No focal marrow replacing lesion. No scalp soft tissue abnormality.  Sinuses/Orbits: Prior bilateral ocular lens replacement with asymmetric axial myopia involving the left lower. Probable chronic left orbital floor fracture noted. Globes orbital soft tissues demonstrate no acute finding. Scattered mucosal thickening noted within the ethmoidal air cells. Large bilateral mastoid effusions with fluid within the nasopharynx. Patient appears to be intubated.  Other: None.  IMPRESSION: 1. Interval development of multiple scattered acute ischemic infarcts involving the bilateral cerebral hemispheres and left pons, largest measuring 4.9 cm at the right occipital lobe. A central thromboembolic etiology is suspected given the various vascular distributions  involved. 2. Otherwise stable appearance of the brain with underlying chronic microvascular ischemic disease. Chronic encephalomalacia at the left frontal and right occipital lobes likely related to prior traumatic brain injury with coup/contrecoup pattern.   Electronically Signed   By: Jeannine Boga M.D.   On: 11/03/2021 06:02   Results for orders placed or performed during the hospital encounter of 09/26/21  CT Head Wo Contrast   Narrative   CLINICAL DATA:  Head trauma, mod-severe; Polytrauma, critical, head/C-spine injury suspected. Syncope. Neck pain.  EXAM: CT HEAD WITHOUT CONTRAST  CT CERVICAL SPINE WITHOUT CONTRAST  TECHNIQUE: Multidetector CT imaging of the head and cervical spine was performed following the standard protocol without intravenous contrast. Multiplanar CT image reconstructions of the cervical spine were also generated.  COMPARISON:  11/01/2018  FINDINGS: CT HEAD FINDINGS  Brain: There is no evidence of an acute infarct, intracranial hemorrhage, mass, midline shift, or extra-axial fluid collection. Encephalomalacia inferiorly in the left greater than right frontal lobes and in the posterior right occipital lobe is unchanged and consistent with the sequelae of remote trauma. Hypodensities elsewhere in the cerebral white matter bilaterally are unchanged and nonspecific but compatible with  moderate chronic small vessel ischemic disease. The ventricles are normal in size.  Vascular: Calcified atherosclerosis at the skull base. No hyperdense vessel.  Skull: No acute fracture or suspicious osseous lesion. Partially visualized remote maxillofacial fractures.  Sinuses/Orbits: Visualized paranasal sinuses and mastoid air cells are clear. Bilateral cataract extraction. Chronically enlarged left globe with history of glaucoma.  Other: None.  CT CERVICAL SPINE FINDINGS  Alignment: Straightening of the normal cervical lordosis.  No listhesis.  Skull base and vertebrae: No acute fracture or suspicious osseous lesion. Ligamentum flavum ossification most notable on the right at C3-4. Prominent median C1-2 arthropathy with unchanged noncompressive partially calcified ligamentous thickening about the dens.  Soft tissues and spinal canal: No prevertebral fluid or swelling. No visible canal hematoma.  Disc levels: Moderate cervical disc and facet degeneration with disc bulging, uncovertebral spurring, and facet spurring resulting in severe neural foraminal stenosis on the left at C3-4 and bilaterally at C6-7.  Upper chest: Clear lung apices.  Other: Moderate calcific atherosclerosis at the carotid bifurcations.  IMPRESSION: 1. No evidence of acute intracranial abnormality. 2. 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 3. Moderate chronic small vessel ischemic disease. 4. No acute cervical spine fracture.   Electronically Signed   By: Logan Bores M.D.   On: 09/26/2021 12:57        PHYSICAL EXAM  Physical Exam  Constitutional: appears chronically ill, older male lying in bed. Psych: flat affect Eyes: L eye opaque, R pupil 28mm reactive, brisk HENT: poor dentition, L nare cortrack, midline trach Cardiovascular: Normal rate and regular rhythm.  Respiratory: Effort normal, non-labored breathing GI: Soft.  No distension. There is no tenderness. Foley removed Skin: sacral pressure ulcer reported, LUE wound covered by dressing  Neuro:  Mental Status: Comatose, non verbal, no spontaneous movement. Grimaces to painful stimuli in all extremities. spontaneous blinking. No movement of extremities to pain   Cranial Nerves: II: Visual Fields are full. R pupil is round, and reactive to light. L eye UTA do to cataract.  III,IV, VI: EOMI without ptosis or diploplia.  V: Facial sensation is symmetric VII: Facial movement is symmetric resting and smiling VIII: No  reaction to voice X: Cough and gag intact XI XII    ASSESSMENT/PLAN Jeffrey Mullen is a 70 y.o. male with history of alcohol use, anxiety/depression, congenital blindness of left eye, and seizures presenting with status epilepticus and now multifocal strokes. brought in by EMS as a code stroke after being found in a grocery store with right-sided gaze and weakness.  Head CT was negative for acute stroke or large vessel obstruction.  He had a witnessed seizure in CT, given 2 mg of Ativan and had apnea warranting intubation. He was loaded with keppra and fosphenytoin, given ativan, and started on propofol. 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   Stroke: bilateral anterior and posterior scattered infarcts, cardioembolic pattern, source unclear CT head- 10/31- no acute intracranial abnormality  CT Angio Head and Neck- 10/31 High-grade stenosis of the proximal left internal carotid artery at the bifurcation. Bilateral high-grade stenoses within the cavernous internal carotid arteries bilaterally. High-grade stenosis or occlusion of the distal right vertebral artery at the dura. CT perfusion demonstrates no acute infarct or significant  ischemia MRI brain - 11/1 - no acute intracranial process MRI head - 11/11- multiple scattered acute ischemic infarcts involving bilateral cerebral hemispheres and left pons 2D Echo - EF 60-65% Mildly dilated L atrium TEE pending given cardioembolic shower  LDL  66 HgbA1c 6.0 Heparin subq for VTE prophylaxis No antithrombotic prior to admission, now on ASA 81. Avoid DAPT due to need of rule out endocarditis.  Ongoing aggressive stroke risk factor management Therapy recommendations Disposition- LTAC- case management to send referrals   Status epilepticus, improved No seizure seen on EEG since 11/3 meningitis and encephalitis were ruled out with negative LP 11/2- normal opening pressure  Off LTM EEG On vimpat and keppra - continue  Respiratory failure Initially  intubated for airway protection Tracheostomy 11/11 CCM on board SW working on placement  Hypertension Stable Long-term BP goal normotensive Previously hypertensive, now Labile BP-> BP meds decreased by CCM. Currently on clonidine 0.1mg  TID, Lisinopril 40mg  daily, Hydralazine 25mg  q6hr  Hyperlipidemia LDL 66, goal < 70 No statin now given elevated LFTs 171/119->164/126 May consider low dose statin once LFTs normalize  Other stroke risk factors PVD Advanced age Smoker  Other Active Problems Alcohol abuse with prior SDH Urinary retention- trial foley removal 11/14 Left eye blind with congenital glaucoma Skin tear left forearm, decubitus ulcer at bilateral heels and sacral region   Hospital day # [address] FNP-BC [facility]  ATTENDING NOTE: I reviewed above note and agree with the assessment and plan. Pt was seen and examined.   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  CT no acute abnormality, CTA head and neck unremarkable, MRI brain unremarkable.  Long-term EEG showed status epilepticus, put on Vimpat and Keppra, now stable.  Long-term EEG eventually discontinued.  However patient continued to present as comatose state, MRI brain repeated on 16/10 showed embolic shower.  On exam, patient obtunded, eyes halfway open with pain summation, on tracheostomy, not follow commands.  Left eye white membrane covering eyeball, right eye PERRL, inconsistent blinking to visual threat.  Not follow commands, nonverbal.  With painful stimuli, slight withdrawing all extremities. Sensation, coordination and gait not tested.  Etiology for patient embolic shower not quite clear.  Given patient fever and recent pneumonia, concerning for bacteremia with endocarditis.  However patient  blood cultures, UA so far negative.  PFO related to stroke are in the differential, however no DVT on lower and upper extremities.  Recommend bedside TEE to rule out endocarditis and for PFO evaluation.  If negative TEE, recommend to hold off further embolic work-up until further neurological recovery.  Continue aspirin 81 for now.  For detailed assessment and plan, please refer to above as I have made changes wherever appropriate.   Jeffrey Hawking, Jeffrey Mullen Stroke Neurology 11/06/2021 10:52 PM  This patient is critically ill due to seizure, embolic shower, respiratory failure status post tracheostomy, near comatose state and at significant risk of neurological worsening, death form recurrent stroke, hemorrhagic conversion, status epilepticus. This patient's care requires constant monitoring of vital signs, hemodynamics, respiratory and cardiac monitoring, review of multiple databases, neurological assessment, discussion with family, other specialists and medical decision making of high complexity. I spent 40 minutes of neurocritical care time in the care of this patient.    To contact Stroke Continuity provider, please refer to [email or web address] After hours, contact General Neurology

## 2021-11-06 NOTE — Consult Note (Signed)
[facility] Nurse wound follow up Ongoing deep tissue injury to sacrum and buttocks, nonintact skin, sloughing of epithelial tissue in maroon discoloration noted  over a week ago.  Staff have  been implementing barrier cream, and pressure redistribution.  Patient has developed loose stools and fecal manager in place.   Heels remain intact with maroon discoloration.  Prevalon boots in place.  Wound type:pressure, incontinence exposure Measurement:Remains bilateral upper andmid gluteal folds 18 cm x 6 cm with 3 cm x 1.5 cm sloughing nonintact lesion to right buttock.  Some slough present.  Will implement collagenase enzymatic debridement to buttocks.  Routine re-assessment by [facility] team every 7-10 days continues. Bedside RN will assess each shift and continue turning and repositioning every two hours.  Will continue wound care to left hand to promote moist wound healing and atraumatic dressing removal with petrolatum gauze.  Wound bed: see above Drainage (amount, consistency, odor) moderate serosanguinous.  Periwound: Flexiseal in place, with some stool oozing/moisture noted.  Pericare performed.  Dressing procedure/placement/frequency: Cleanse buttocks wounds with NS and pat dry. Apply barrier cream to intact skin. Apply Santyl to open lesion and cover with NS moist gauze.  Secure with dry dressing.  Change twice daily.  Continue Prevalon to bilateral heels  Continue pertrolatum gauze to hand wounds.  Will follow.  Domenic Moras MSN, RN, FNP-BC [facility] Wound, Ostomy, Continence Nurse Pager [phone]

## 2021-11-06 NOTE — Progress Notes (Signed)
SLP Cancellation Note  Patient Details Name: Jeffrey Mullen MRN: [id number] DOB: 1951-02-15   Cancelled treatment:        Reason eval/treat not completed: Pt lethargic and largely unresponsive to commands; not appropriate for inline PMV. SLP will continue to follow for readiness.   [name], SLP-Student   Islandia Deleon Passe 11/06/2021, 10:23 AM

## 2021-11-06 NOTE — Progress Notes (Addendum)
Nutrition Follow-up  DOCUMENTATION CODES:   Not applicable  INTERVENTION:    Tube feeding via Cortrak tube: Adjust TF:  Jevity 1.5 at 55 ml/h (1320 ml per day) Prosource TF 90 ml BID  Provides 2140 kcal, 133 gm protein, 1003 ml free water daily  MVI with minerals via tube  -Add: 1 packet Juven BID, each packet provides 95 calories, 2.5 grams of protein (collagen), and 9.8 grams of carbohydrate (3 grams sugar); also contains 7 grams of L-arginine and L-glutamine, 300 mg vitamin C, 15 mg vitamin E, 1.2 mcg vitamin B-12, 9.5 mg zinc, 200 mg calcium, and 1.5 g  Calcium Beta-hydroxy-Beta-methylbutyrate to support wound healing  200 ml free water every 4 hours  Total free water: 2203 ml   NUTRITION DIAGNOSIS:   Inadequate oral intake related to inability to eat as evidenced by NPO status. Ongoing.   GOAL:   Patient will meet greater than or equal to 90% of their needs Met with TF.   MONITOR:   Vent status, Labs, Weight trends, TF tolerance, Skin, I & O's  REASON FOR ASSESSMENT:   Ventilator    ASSESSMENT:   Pt with PMH of hepatitis C, alcohol abuse and seizures admitted with with status epilepticus.  Per SLP pt is not appropriate for inline PMV due to lethargy.   11/11 new emboilc CVA; trach placed   Patient is currently intubated on ventilator support MV: 10.2 L/min Temp (24hrs), Avg:99.6 F (37.6 C), Min:98.9 F (37.2 C), Max:101.3 F (38.5 C)  Medications reviewed and include: folic acid, SSI, novolog, levemir, MVI with minerals, protonix, thiamine  Labs reviewed: Na 153 CBG's: 111-121  UOP: 4450 ml  Stool: 700 ml  I&O: + 11.9 L   Diet Order:   Diet Order             Diet NPO time specified  Diet effective midnight                   EDUCATION NEEDS:   Not appropriate for education at this time  Skin:   DTI: buttocks, R/L heels, L elbow  Last BM:  11/21 type 6  Height:   Ht Readings from Last 1 Encounters:  11/02/21 '5\' 10"'   (1.778 m)    Weight:   Wt Readings from Last 1 Encounters:  11/13/21 82.5 kg    Ideal Body Weight:  75.5 kg  BMI:  Body mass index is 26.1 kg/m.  Estimated Nutritional Needs:   Kcal:  2100-2300  Protein:  120-140 grams  Fluid:  > 2 /day  Lockie Pares., RD, LDN, CNSC See [facility] for contact information

## 2021-11-06 NOTE — Progress Notes (Signed)
    [facility] has been requested to perform a transesophageal echocardiogram on Mr. Speckman for Stroke.  After careful review of history and examination, the risks and benefits of transesophageal echocardiogram have been explained to family member Jyl Heinz [phone] including risks of esophageal damage, perforation (1:10,000 risk), bleeding, pharyngeal hematoma as well as other potential complications associated with conscious sedation including aspiration, arrhythmia, respiratory failure and death. Alternatives to treatment were discussed, questions were answered.  TEE -tomorrow at 11 am at bedside. NPO after midnight (stop feeding tube - discussed with nurse).   Leanor Kail, PA-C 11/06/2021 5:01 PM

## 2021-11-06 NOTE — Progress Notes (Addendum)
NAME:  Jeffrey Mullen, MRN:  [id number], DOB:  1951-11-17, LOS: 55 ADMISSION DATE:  10/23/2021, CONSULTATION DATE:  10/31 REFERRING MD:  Dr. Dina Rich, CHIEF COMPLAINT:  AMS   History of Present Illness:  70 y/o M who presented to [facility] ER on 10/31 via EMS with reports of altered mental status.  Given  right sided gaze, altered and left sided weakness, code stoke called. The patient was taken emergently to CT for imaging which was negative for LVO.  While in the [place], he had a witnessed seizure.    PCCM called for ICU admission.   Chart review shows he was seen in the ER on 10/4 after passing out at the [employer].  Physical exam was negative at the time.  Labs negative.  No further imaging obtained.   Pertinent  Medical History  Alcohol Use - unclear quantity Anxiety / Depression  Congenital blindness of left eye, cataract on R Hepatitis C+, treated in 2016 Seizures   Significant Hospital Events: Including procedures, antibiotic start and stop dates in addition to other pertinent events   10/31 Admit with AMS, seizure, hypertensive emergency, AKI 11/1 MRI brain no acute intracranial process. No etiology is seen for the patient's seizure. 11/02 bedside LP attempted but unsuccessful  11/03  No seizures seen on EEG overnight  11/04 weaning sedation to assess for seizure reoccurrence  11/9 bronchoscopy with noted thick secretions in LLL 11/11 Trach,  Cor Track placed, New Embolic Strokes per MRI 27/03 Post Trach Day 1, some oozing from site, Echo results noted below 11/13 Low grade fever, tachy, LFT's remain elevated with ALT and Alk Phos continuing to rise.  S/p lasix   Last Blood Cx 11/8 Neg Last sputum Cx ( BAL) >> 11/9 >> No growth Tracheal Aspirate 11/8>> Normal Flora Trach asp 11/13 >  11/13 BC x1 >>  MRI Brain 11/11 Interval development of multiple scattered acute ischemic infarcts involving the bilateral cerebral hemispheres and left pons, largest measuring 4.9 cm  at the right occipital lobe. A central thromboembolic etiology is suspected given the various vascular distributions involved.  Echo 11/11 ECHO reviewed  - Left ventricle: The cavity size was normal. There was moderate    concentric hypertrophy. Systolic function was normal. The    estimated ejection fraction was in the range of 60% to 65%. Wall    motion was normal; there were no regional wall motion    abnormalities. Features are consistent with a pseudonormal left    ventricular filling pattern, with concomitant abnormal relaxation    and increased filling pressure (grade 2 diastolic dysfunction).  - Aortic valve: There was no significant regurgitation.  - Mitral valve: There was no significant regurgitation.  - Left atrium: The atrium was mildly dilated.  - Right ventricle: Systolic function was normal.  - Atrial septum: No defect or patent foramen ovale was identified.  - Tricuspid valve: There was no significant regurgitation.  - Pulmonic valve: There was no significant regurgitation.  - Inferior vena cava: The vessel was normal in size. The    respirophasic diameter changes were in the normal range (>= 50%),    consistent with normal central venous pressure.   RUQ limited US 11/13 > Heterogeneous increased hepatic parenchymal echogenicity typical of steatosis or other intrinsic hepatocellular disease. Nodular hepatic contours suggesting cirrhosis.  Unremarkable sonographic appearance of the gallbladder and biliary tree.  Interim History / Subjective:  No events overnight Did wean PSV 8/5 yest Tmax 99.8 UOP 4.45L/ 24hrs  Remains net +11.5L  Objective   Blood pressure (!) 155/64, pulse 98, temperature 99.8 F (37.7 C), temperature source Axillary, resp. rate (!) 21, height _0  (1.778 m), weight 90 kg, SpO2 100 %.    Vent Mode: PRVC FiO2 (%):  [40 %] 40 % Set Rate:  [26 bmp] 26 bmp Vt Set:  [440 mL] 440 mL PEEP:  [5 cmH20] 5 cmH20 Pressure Support:  [5 cmH20] 5  cmH20 Plateau Pressure:  [15 cmH20] 15 cmH20   Intake/Output Summary (Last 24 hours) at 11/06/2021 0735 Last data filed at 11/06/2021 0600 Gross per 24 hour  Intake 3161.93 ml  Output 5150 ml  Net -1988.07 ml   Filed Weights   11/01/21 0500 11/03/21 1325 11/04/21 0400  Weight: 87.6 kg 87.7 kg 90 kg   Examination: General:  chronically ill appearing older male sitting upright in bed in NAD HEENT: MM pink/moist, midline trach, sutured, site wnl, R pupils 4/reactive, left opaque, left nare cortrak, poor dentition  Neuro: R eye open but doesn't blink to threat or track.  Slight grimace to noxious stimuli but otherwise flaccid in LE CV: ST 106, no murmur PULM:  currently weaning on 10/5, anteriorly clear, coarse in base, no secretions GI: soft, bs+, foley- cyu Extremities: warm/dry, trace LE and UE edema Skin:  LUE edematous with dressing, cap refill brisk, posterior not examined- reported sacral pressure injury  Labs reviewed: Na 152 > 153, K 3.8, CL 118, sCr 1.08 > 0.99, alk phos 518, AST/ ALT 164/126, WBC 13.3 > 16.1, Hgb 8.6 > 9.1   Resolved Hospital Problem list   Hyperkalemia  Assessment & Plan:   Status epilepticus, improved -No seizure seen on EEG since 11/3 -meningitis and encephalitis was ruled out with negative LP 11/2 -MRI 11/1 with no acute process  Alcohol abuse with prior SDH 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  - Trend Mag , Goal > 2 , replete as needed - HIV neg, RPR neg, TSH 4.708, UA neg, MMA pending, B12 wnl - continue MVI, thiamine, folate  Acute Hypoxic  Respiratory Failure  RLL Pneumonia, Haemophilus influenzae Trach 11/11  Plan - continue full MV support with daily PSV, hopeful to move to ATC trials possibly today - VAP prevention protocol/ PPI - PAD protocol for sedation> not needed - ongoing trach care - continue mucinex, BD - BAL 11/9 neg.  Pending trach asp 11/13 >   AKI due to contrast-induced ATN, resolved Baseline sr cr ~0.9 to 1  Nontraumatic rhabdomyolysis, resolved Non-anion Gap Metabolic Acidosis, resolved Hypokalemia/hypernatremia due to aggressive diuresis  Hypophosphatemia, resolved Hypernatremia Urinary retention Plan - Continue free water 200 cc's Q 4, Na not much improved likely due to diuresis 11/13 - hold on further diuresis today, remains net +11L, but not grossly overloaded on exam - strict I/Os, trending renal indices - will try foley removal.  Placed 10/10 for rentention, started on bethanechol  Febrile State  Mild Leucocytosis >> down trending >> ?neuro fever Plan -Trend WBC and Fever Curve - hold on further abx at this time - follow 11/13 BC and trach asp  Elevated LFT's  Total  Bili normal Hx of HCV Plan - GGT 816 - LFTs stable/ slightly better  alk phos 371 > 518, AST 171 > 164, ALT 171 > 126, t. Bili wnl, ammonia 11/13 17 - RUQ limited US 11/13 > Heterogeneous increased hepatic parenchymal echogenicity typical of steatosis or other intrinsic hepatocellular disease. Nodular hepatic contours suggesting cirrhosis.  Unremarkable sonographic appearance of the gallbladder and biliary tree.  Prediabetes hemoglobin A1c 6.0 Hyperglycemia  Plan -Continue SSI moderate q4 -reduce TF coverage 4 to 2 units q4 hrs given lower end of glucose range  Vasopressor Extravasation Skin Injury - left hand/arm S/p phentolamine and nitroglycerin application Left Arm Skin Tear>> skin is sluffing with pink granulating tissue beneath Unstageable sacral ulcer (POA)>> Progression despite topical barrier treatment   Plan - frequent turns - Continue dressing changes to L forearm - WOC re-consulted 11/13  Uncontrolled Hypertension Labile BP at times  Plan - bp meds reduced on 11/12 due to lower BP - clonidine 0.1 mg TID, amlodipine 10 mg daily, lisinopril 40 mg, and hydralazine 55m q 6hr - Continue prn IV hydralazine and labetalol as needed   Complex social dynamics Patient's daughter does not want to be decision-maker for him, she made her mother, LLattie Haw his ex-significant other to be the primary decision maker.  Best Practice (right click and "Reselect all SmartList Selections" daily)   Diet/type: tubefeeds DVT prophylaxis: prophylactic heparin  GI prophylaxis: PPI Lines: N/A Foley:  foley in place Code Status:  full code  Total critical care time: 35 minutes     BKennieth Rad ACNP [facility] 11/06/2021, 9:10 AM  See Amion for pager If no response to pager, please call PCCM consult pager After 7:00 pm call Elink

## 2021-11-07 ENCOUNTER — Encounter (HOSPITAL_COMMUNITY): Payer: Medicare Other

## 2021-11-07 ENCOUNTER — Inpatient Hospital Stay (HOSPITAL_COMMUNITY): Payer: Medicare Other

## 2021-11-07 DIAGNOSIS — G9340 Encephalopathy, unspecified: Secondary | ICD-10-CM | POA: Diagnosis not present

## 2021-11-07 DIAGNOSIS — I639 Cerebral infarction, unspecified: Secondary | ICD-10-CM | POA: Diagnosis not present

## 2021-11-07 DIAGNOSIS — J9601 Acute respiratory failure with hypoxia: Secondary | ICD-10-CM | POA: Diagnosis not present

## 2021-11-07 DIAGNOSIS — R569 Unspecified convulsions: Secondary | ICD-10-CM | POA: Diagnosis not present

## 2021-11-07 LAB — RENAL FUNCTION PANEL
Albumin: 1.8 g/dL — ABNORMAL LOW (ref 3.5–5.0)
Anion gap: 8 (ref 5–15)
BUN: 24 mg/dL — ABNORMAL HIGH (ref 8–23)
CO2: 28 mmol/L (ref 22–32)
Calcium: 8.6 mg/dL — ABNORMAL LOW (ref 8.9–10.3)
Chloride: 116 mmol/L — ABNORMAL HIGH (ref 98–111)
Creatinine, Ser: 0.94 mg/dL (ref 0.61–1.24)
GFR, Estimated: >60 mL/min (ref >60)
Glucose, Bld: 106 mg/dL — ABNORMAL HIGH (ref 70–99)
Phosphorus: 3.7 mg/dL (ref 2.5–4.6)
Potassium: 3.4 mmol/L — ABNORMAL LOW (ref 3.5–5.1)
Sodium: 152 mmol/L — ABNORMAL HIGH (ref 135–145)

## 2021-11-07 LAB — GLUCOSE, CAPILLARY
Glucose-Capillary: 64 mg/dL — ABNORMAL LOW (ref 70–99)
Glucose-Capillary: 89 mg/dL (ref 70–99)
Glucose-Capillary: 72 mg/dL (ref 70–99)
Glucose-Capillary: 78 mg/dL (ref 70–99)
Glucose-Capillary: 91 mg/dL (ref 70–99)
Glucose-Capillary: 101 mg/dL — ABNORMAL HIGH (ref 70–99)
Glucose-Capillary: 91 mg/dL (ref 70–99)

## 2021-11-07 LAB — CULTURE, RESPIRATORY W GRAM STAIN
Culture: NORMAL
Special Requests: NORMAL

## 2021-11-07 MED ORDER — FENTANYL CITRATE PF 50 MCG/ML IJ SOSY
PREFILLED_SYRINGE | INTRAMUSCULAR | Status: AC
  Filled 2021-11-07: qty 1

## 2021-11-07 MED ORDER — DEXTROSE 50 % IV SOLN
INTRAVENOUS | Status: AC
  Administered 2021-11-07: 12.5 g via INTRAVENOUS
  Filled 2021-11-07: qty 50

## 2021-11-07 MED ORDER — MIDAZOLAM HCL 2 MG/2ML IJ SOLN
INTRAMUSCULAR | Status: AC
  Filled 2021-11-07: qty 2

## 2021-11-07 MED ORDER — SODIUM CHLORIDE 0.9 % IV SOLN: INTRAVENOUS | Status: DC

## 2021-11-07 MED ORDER — DEXTROSE 50 % IV SOLN: 12.5000 g | INTRAVENOUS | Status: AC

## 2021-11-07 MED ORDER — MIDAZOLAM HCL 2 MG/2ML IJ SOLN
2.0000 mg | Freq: Every day | INTRAMUSCULAR | Status: AC | PRN
  Administered 2021-11-07: 2 mg via INTRAVENOUS
  Filled 2021-11-07: qty 4

## 2021-11-07 MED ORDER — MIDAZOLAM HCL 2 MG/2ML IJ SOLN
1.0000 mg | Freq: Once | INTRAMUSCULAR | Status: AC
  Administered 2021-11-07: 1 mg via INTRAVENOUS

## 2021-11-07 MED ORDER — SODIUM CHLORIDE 0.9 % IV SOLN
250.0000 mL | INTRAVENOUS | Status: DC
  Administered 2021-11-07: 250 mL via INTRAVENOUS

## 2021-11-07 MED ORDER — FENTANYL CITRATE PF 50 MCG/ML IJ SOSY
25.0000 ug | PREFILLED_SYRINGE | Freq: Once | INTRAMUSCULAR | Status: AC
  Administered 2021-11-07: 25 ug via INTRAVENOUS

## 2021-11-07 MED ORDER — HYDROCHLOROTHIAZIDE 12.5 MG PO TABS
12.5000 mg | ORAL_TABLET | Freq: Every day | ORAL | Status: DC
Start: 2021-11-08 — End: 2021-11-17
  Administered 2021-11-08 – 2021-11-17 (×10): 12.5 mg
  Filled 2021-11-07 (×10): qty 1

## 2021-11-07 MED ORDER — ACETAMINOPHEN 650 MG RE SUPP
650.0000 mg | RECTAL | Status: AC | PRN
  Administered 2021-11-07 – 2021-11-08 (×2): 650 mg via RECTAL
  Filled 2021-11-07 (×2): qty 1

## 2021-11-07 MED ORDER — AMLODIPINE BESYLATE 5 MG PO TABS
5.0000 mg | ORAL_TABLET | Freq: Every day | ORAL | Status: DC
  Administered 2021-11-08 – 2021-11-13 (×6): 5 mg
  Filled 2021-11-07 (×6): qty 1

## 2021-11-07 MED ORDER — PHENYLEPHRINE HCL-NACL 20-0.9 MG/250ML-% IV SOLN
25.0000 ug/min | INTRAVENOUS | Status: DC
  Administered 2021-11-07: 25 ug/min via INTRAVENOUS
  Filled 2021-11-07: qty 250

## 2021-11-07 MED ORDER — FENTANYL CITRATE PF 50 MCG/ML IJ SOSY
25.0000 ug | PREFILLED_SYRINGE | Freq: Every day | INTRAMUSCULAR | Status: AC | PRN
  Administered 2021-11-07 (×2): 50 ug via INTRAVENOUS
  Filled 2021-11-07: qty 2

## 2021-11-07 MED ORDER — PANTOPRAZOLE SODIUM 40 MG IV SOLR
40.0000 mg | INTRAVENOUS | Status: DC
  Administered 2021-11-07: 40 mg via INTRAVENOUS
  Filled 2021-11-07: qty 40

## 2021-11-07 MED ORDER — POTASSIUM CHLORIDE 10 MEQ/50ML IV SOLN
10.0000 meq | INTRAVENOUS | Status: AC
  Administered 2021-11-07 (×4): 10 meq via INTRAVENOUS
  Filled 2021-11-07: qty 50

## 2021-11-07 MED ORDER — HYDRALAZINE HCL 20 MG/ML IJ SOLN: 5.0000 mg | INTRAMUSCULAR | Status: DC | PRN

## 2021-11-07 NOTE — Progress Notes (Signed)
[facility] [facility] Progress Note Patient Name: Jeffrey Mullen DOB: Nov 30, 1951 MRN: [id number]   Date of Service  11/07/2021  HPI/Events of Note  Patient with soft blood pressures.  eICU Interventions  Peripheral Phenylephrine gtt ordered.        Kerry Kass [name] 11/07/2021, 3:35 AM

## 2021-11-07 NOTE — Progress Notes (Signed)
Physical Therapy Treatment Patient Details Name: Jeffrey Mullen MRN: [id number] DOB: March 04, 1951 Today's Date: 11/07/2021   History of Present Illness 70 y/o male presented to ED on 10/23/21 after bystanders witnessed patient collapse with associated seizure. Another seizure witnessed in ED. CT head negative. CT C-spine negative. LTM EEG on 11/1 showed 2 seizures from R anterior temporal region. Last seizure noted on EEG on 11/3. Intubated 10/31-11/11. Trach placed 11/11. 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 PMH: alcohol abuse, congenital blindness in L eye, bilateral glaucoma, depression, anxiety, HTN, seizures    PT Comments    Pt lethargic, received some sedation prior to TEE this AM. Did open eyes more and less at different times and sometimes correlated to body movements facilitated by therapist. Pt seemed to be assisting minimally with RLE PROM on command as well. Pt rolled B with tot A +2. Bed placed in chair position for LE and UE ROM and pt with increased cough and bloody sputum. VSS remained stable throughout mobility. Keep on 1x/ wk trial and spoke with RN about reaching out if he is more alert before next session. PT will continue to follow.    Recommendations for follow up therapy are one component of a multi-disciplinary discharge planning process, led by the attending physician.  Recommendations may be updated based on patient status, additional functional criteria and insurance authorization.  Follow Up Recommendations  PT at Long-term acute care hospital     Assistance Recommended at Discharge Frequent or constant Supervision/Assistance  Equipment Recommendations  Other (comment) (TBD)    Recommendations for Other Services       Precautions / Restrictions Precautions Precautions: Fall Precaution Comments: trach, cortrak, seizure, blisters  bilateral UEs, soft tissue injuries (bandaged) on L UE Restrictions Weight Bearing Restrictions: No     Mobility  Bed Mobility Overal bed mobility: Needs Assistance Bed Mobility: Rolling Rolling: +2 for physical assistance;Total assist         General bed mobility comments: Rolled R and L with tot A +2. Placed bed in chair position and pt seemed somewhat more alert. Coughing up bloody sputum today since TEE, more so in upright sitting with cough. Left bed in this position and relayed to RN    Transfers                   General transfer comment: not appropriate    Ambulation/Gait                   Stairs             Wheelchair Mobility    Modified Rankin (Stroke Patients Only) Modified Rankin (Stroke Patients Only) Pre-Morbid Rankin Score: No symptoms (assumption) Modified Rankin: Severe disability     Balance                                            Cognition Arousal/Alertness: Lethargic Behavior During Therapy: Flat affect Overall Cognitive Status: Difficult to assess                                 General Comments: received meds earlier in day for TEE which could explain lethargy today. Did open R eye more and less  with body stimulation. Seemed to attempt assisting with LE mvmt on R when cued        Exercises Other Exercises Other Exercises: B knee flex and ext, 5x Other Exercises: B ankle df/pf, 5x Other Exercises: B hip flex 5x Other Exercises: B shoulder flex, 5x Other Exercises: B elbow flex/ ext 5x    General Comments General comments (skin integrity, edema, etc.): pt on trach collar, VSS with PROM      Pertinent Vitals/Pain Pain Assessment: Faces Faces Pain Scale: No hurt Facial Expression: Relaxed, neutral Body Movements: Absence of movements Muscle Tension: Relaxed Compliance with ventilator (intubated pts.): Tolerating ventilator or movement Vocalization (extubated pts.):  N/A CPOT Total: 0    Home Living                          Prior Function            PT Goals (current goals can now be found in the care plan section) Acute Rehab PT Goals Patient Stated Goal: unable to state PT Goal Formulation: Patient unable to participate in goal setting Time For Goal Achievement: 11/18/21 Potential to Achieve Goals: Fair Progress towards PT goals: Not progressing toward goals - comment (level of arousal)    Frequency    Min 1X/week (until increased alertness)      PT Plan Current plan remains appropriate    Co-evaluation              AM-PAC PT "6 Clicks" Mobility   Outcome Measure  Help needed turning from your back to your side while in a flat bed without using bedrails?: Total Help needed moving from lying on your back to sitting on the side of a flat bed without using bedrails?: Total Help needed moving to and from a bed to a chair (including a wheelchair)?: Total Help needed standing up from a chair using your arms (e.g., wheelchair or bedside chair)?: Total Help needed to walk in hospital room?: Total Help needed climbing 3-5 steps with a railing? : Total 6 Click Score: 6    End of Session Equipment Utilized During Treatment: Oxygen Activity Tolerance: Patient limited by lethargy Patient left: in bed Nurse Communication: Mobility status;Other (comment) (level of arousal) PT Visit Diagnosis: Muscle weakness (generalized) (M62.81);Difficulty in walking, not elsewhere classified (R26.2);Other symptoms and signs involving the nervous system (R29.898)     Time: 9937-1696 PT Time Calculation (min) (ACUTE ONLY): 28 min  Charges:  $Therapeutic Activity: 23-37 mins                     Leighton Roach, [place]  Pager [phone] Office [place] 11/07/2021, 3:06 PM

## 2021-11-07 NOTE — Progress Notes (Signed)
Pt placed on 40% ATC and is tolerating well at this time. RN made aware.

## 2021-11-07 NOTE — Progress Notes (Signed)
Passy-Muir Valve Evaluation    11/07/21 1041  SLP Visit Information  SLP Received On 11/07/21  Subjective  Subjective Pt awake but lethargic and requires cueing to attend to tasks  Patient/Family Stated Goal pt unable to state goal  Pain Assessment  Pain Assessment Faces  Faces Pain Scale 2  Pain Location general  Pain Descriptors / Indicators Discomfort  Pain Intervention(s) Monitored during session  General Information  HPI 70 y/o male presented to ED on 10/23/21 after bystanders witnessed patient collapse with associated seizure. Another seizure witnessed in ED. CT head negative. CT C-spine negative. LTM EEG on 11/1 showed 2 seizures from R anterior temporal region. Last seizure noted on EEG on 11/3. Intubated 10/31-11/11. Trach placed 11/11. 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 PMH: alcohol abuse, congenital blindness in L eye, bilateral glaucoma, depression, anxiety, HTN, seizures  Cuff Deflation Trial  Tolerated Cuff Deflation Yes  Length of Time for Cuff Deflation Trial 6m  Behavior Alert;Cooperative  PMSV Trial  PMSV was placed for 35m (3-5 minute increments)  Able to redirect subglottic air through upper airway Yes  Able to Attain Phonation No  Voice Quality Wet  Able to Expectorate Secretions Yes  Level of Secretion Expectoration with PMSV Oral  Breath Support for Phonation Moderately decreased  Intelligibility Intelligibility reduced  Word 0-24% accurate  Phrase Not tested  Sentence Not tested  Conversation Not tested  Respirations During Trial  (22-40)  SpO2 During Trial  (99-100)  Pulse During Trial 103  Behavior Alert;Cooperative  SLP - End of Session  Patient left in bed;with call bell/phone within reach;Other (comment) (NP in room)  Nurse Communication Treatment plan  Assessment  Clinical Impression Statement (ACUTE ONLY) Pt on  trach collar was seen for a PMV evaluation. Pt awake but lethargic, requiring cues to attend to all tasks. SLP deflated cuff noting temporary increase in respiratory rate (to 40) quickly returning to mid-twenties after ~30 seconds and donned speaking valve noting no sat changes (HR 103, RR 27, SpO2 99-100) throughout trial. Pt tolerated cuff deflation ~13 minutes during session and PMV for 10 minutes total in 3-70m increments. SLP doffed PMV intermittently noting no evidence of air trapping. Pt expectorated copious clear mucous to level of trach without valve and small amount was expectorated to oral cavity while valve was donned. Pt redirected air through upper airway however attempts to vocalize were unsuccessful d/t amount of secretions this date. While wearing valve, pt answered yes/no questions x1 and followed simple 1-step commands x2 when provided with repetition of prompt. Recommend PMV use with full supervision with SLP only. Cuff was left deflated at end of session d/t successful tolerance. Consider upgrading pt to cuffless trach as appropriate.  SLP Visit Diagnosis Aphonia (R49.1)  SLP Recommendation/Assessment Patient needs continued Speech Lanaguage Pathology Services  Problem List Other (comment) (aphonia)  PMSV - Therapy Diagnosis Aphonia  Plan  Speech Therapy Frequency (ACUTE ONLY) min 2x/week  Duration 2 weeks  Treatment/Interventions Functional tasks;SLP instruction and feedback;Compensatory strategies;Patient/family education;Multimodal communcation approach  Potential to Achieve Goals (ACUTE ONLY) Good  Potential Considerations (ACUTE ONLY) Severity of impairments  SLP Recommendations  Patient may use Passy-Muir Speech Valve with SLP only  PMSV Supervision Full  MD: Please consider changing trach tube to  Cuffless  Recommendations for Other Services Other (Comment) (tbd)  Follow Up Recommendations Other (comment) (tbd)  Assistance recommended at discharge Other (comment) (tbd)  Functional Status Assessment Patient has had a recent decline in their functional status and demonstrates the ability to make significant improvements in function in a reasonable and predictable amount of time.  Individuals Consulted  Consulted and Agree with Results and Recommendations Patient unable/family or caregive not available  SLP Time Calculation  SLP Start Time (ACUTE ONLY) 0957  SLP Stop Time (ACUTE ONLY) 1012  SLP Time Calculation (min) (ACUTE ONLY) 15 min  SLP Evaluations  $ SLP Speech Visit 1 Visit  SLP Evaluations  $ SLP Eval Voice Prosthetic Device Procedure  $$ Passy Muir Speaking Valve yes   [name], SLP-Student

## 2021-11-07 NOTE — Progress Notes (Addendum)
STROKE TEAM PROGRESS NOTE   SUBJECTIVE (INTERVAL HISTORY) No family present at this time. Patient continues to be in a near comatose state. TEE attempted today by cardiology. Ultimately the TEE was unsuccessful.    OBJECTIVE Vitals:   11/07/21 1100 11/07/21 1114 11/07/21 1200 11/07/21 1300  BP: (!) 152/72 (!) 150/59 127/61 (!) 143/55  Pulse: 99 (!) 103 (!) 102 96  Resp: (!) 23 (!) 23 (!) 21 17  Temp:   (!) 100.5 F (38.1 C)   TempSrc:   Axillary   SpO2: 100% 100% 96% 98%  Weight:      Height:        CBC:  Recent Labs  Lab 11/05/21 0600 11/06/21 0615  WBC 13.3* 16.1*  NEUTROABS 10.4* 12.7*  HGB 8.6* 9.1*  HCT 26.5* 28.0*  MCV 85.2 86.2  PLT 280 348     Basic Metabolic Panel:  Recent Labs  Lab 11/04/21 0234 11/04/21 1241 11/06/21 0615 11/07/21 0540  NA 156*   < > 153* 152*  K 3.1*   < > 3.8 3.4*  CL 120*   < > 118* 116*  CO2 28   < > 28 28  GLUCOSE 172*   < > 135* 106*  BUN 36*   < > 24* 24*  CREATININE 1.37*   < > 0.99 0.94  CALCIUM 8.7*   < > 8.8* 8.6*  MG 2.5*  --  2.3  --   PHOS 3.0   < > 3.4 3.7   < > = values in this interval not displayed.     Lipid Panel:  Recent Labs  Lab 11/04/21 1243  CHOL 126  TRIG 237*  HDL 13*  CHOLHDL 9.7  VLDL 47*  LDLCALC 66    HgbA1c:  Lab Results  Component Value Date   HGBA1C 6.0 (H) 10/26/2021   Urine Drug Screen:     Component Value Date/Time   LABOPIA NONE DETECTED 10/23/2021 1520   COCAINSCRNUR NONE DETECTED 10/23/2021 1520   LABBENZ NONE DETECTED 10/23/2021 1520   AMPHETMU NONE DETECTED 10/23/2021 1520   THCU NONE DETECTED 10/23/2021 1520   LABBARB NONE DETECTED 10/23/2021 1520    Alcohol Level     Component Value Date/Time   ETH <10 10/23/2021 1618    IMAGING  Results for orders placed or performed during the hospital encounter of 10/23/21  MR BRAIN W WO CONTRAST   Narrative   CLINICAL DATA:  Seizure, nontraumatic  EXAM: MRI HEAD WITHOUT AND WITH  CONTRAST  TECHNIQUE: Multiplanar, multiecho pulse sequences of the brain and surrounding structures were obtained without and with intravenous contrast.  CONTRAST:  50mL GADAVIST GADOBUTROL 1 MMOL/ML IV SOLN  COMPARISON:  No prior MRI, correlation is made with CT head 10/23/2021  FINDINGS: Brain: No acute infarction, hemorrhage, hydrocephalus, extra-axial collection or mass lesion. No abnormal enhancement. T2 hyperintense signal in the periventricular white matter, likely the sequela of chronic small vessel ischemic disease. Left frontal encephalomalacia, related to remote intraparenchymal hemorrhage. Redemonstrated lacunar infarcts in the bilateral basal ganglia.  Vascular: Normal flow voids.  Skull and upper cervical spine: Normal marrow signal.  Sinuses/Orbits: Elongated left globe, which appears stable compared to remote exams. Status post bilateral lens replacements. The paranasal sinuses are clear.  Other: Mild fluid throughout the bilateral mastoid air cells.  IMPRESSION: No acute intracranial process. No etiology is seen for the patient's seizure.   Electronically Signed   By: Merilyn Baba M.D.   On: 10/24/2021 18:53   MR  BRAIN WO CONTRAST   Narrative   CLINICAL DATA:  Initial evaluation for mental status change.  EXAM: MRI HEAD WITHOUT CONTRAST  TECHNIQUE: Multiplanar, multiecho pulse sequences of the brain and surrounding structures were obtained without intravenous contrast.  COMPARISON:  Comparison made with prior MRI from 10/24/2021  FINDINGS: Brain: Mild age-related cerebral atrophy. Patchy and confluent T2/FLAIR hyperintensity involving the periventricular and deep white matter both cerebral hemispheres most consistent with chronic small vessel ischemic disease, mild in nature. Encephalomalacia and gliosis at the anterior/inferior left frontal lobe likely related to prior trauma. Mild cephalo malacia with chronic hemosiderin staining at the  posterior right occipital region likely related to prior trauma as well.  There has been interval development of multiple scattered ischemic infarcts involving the bilateral cerebral hemispheres and left pons. The largest area of infarction is present at the right occipital lobe and measures 4.9 cm, right PCA distribution (series 5, image 77). Mild petechial blood products present at this location. The left pontine infarct measures 1.1 cm (series 5, image 72). The remaining infarcts are largely subcentimeter in size and subcortical in location. Patchy involvement of the bilateral basal ganglia noted. No other associated hemorrhage or mass effect.  No mass lesion or significant mass effect. No midline shift or hydrocephalus. No extra-axial fluid collection. Pituitary gland suprasellar region normal. Midline structures intact.  Vascular: Major intracranial vascular flow voids are maintained.  Skull and upper cervical spine: Craniocervical junction within normal limits. Diffusely decreased T1 signal intensity within the bone marrow of the visualized upper cervical spine, nonspecific, but most commonly related to anemia, smoking from PCD. No focal marrow replacing lesion. No scalp soft tissue abnormality.  Sinuses/Orbits: Prior bilateral ocular lens replacement with asymmetric axial myopia involving the left lower. Probable chronic left orbital floor fracture noted. Globes orbital soft tissues demonstrate no acute finding. Scattered mucosal thickening noted within the ethmoidal air cells. Large bilateral mastoid effusions with fluid within the nasopharynx. Patient appears to be intubated.  Other: None.  IMPRESSION: 1. Interval development of multiple scattered acute ischemic infarcts involving the bilateral cerebral hemispheres and left pons, largest measuring 4.9 cm at the right occipital lobe. A central thromboembolic etiology is suspected given the various vascular distributions  involved. 2. Otherwise stable appearance of the brain with underlying chronic microvascular ischemic disease. Chronic encephalomalacia at the left frontal and right occipital lobes likely related to prior traumatic brain injury with coup/contrecoup pattern.   Electronically Signed   By: Jeannine Boga M.D.   On: 11/03/2021 06:02   Results for orders placed or performed during the hospital encounter of 09/26/21  CT Head Wo Contrast   Narrative   CLINICAL DATA:  Head trauma, mod-severe; Polytrauma, critical, head/C-spine injury suspected. Syncope. Neck pain.  EXAM: CT HEAD WITHOUT CONTRAST  CT CERVICAL SPINE WITHOUT CONTRAST  TECHNIQUE: Multidetector CT imaging of the head and cervical spine was performed following the standard protocol without intravenous contrast. Multiplanar CT image reconstructions of the cervical spine were also generated.  COMPARISON:  11/01/2018  FINDINGS: CT HEAD FINDINGS  Brain: There is no evidence of an acute infarct, intracranial hemorrhage, mass, midline shift, or extra-axial fluid collection. Encephalomalacia inferiorly in the left greater than right frontal lobes and in the posterior right occipital lobe is unchanged and consistent with the sequelae of remote trauma. Hypodensities elsewhere in the cerebral white matter bilaterally are unchanged and nonspecific but compatible with moderate chronic small vessel ischemic disease. The ventricles are normal in size.  Vascular: Calcified atherosclerosis at  the skull base. No hyperdense vessel.  Skull: No acute fracture or suspicious osseous lesion. Partially visualized remote maxillofacial fractures.  Sinuses/Orbits: Visualized paranasal sinuses and mastoid air cells are clear. Bilateral cataract extraction. Chronically enlarged left globe with history of glaucoma.  Other: None.  CT CERVICAL SPINE FINDINGS  Alignment: Straightening of the normal cervical lordosis.  No listhesis.  Skull base and vertebrae: No acute fracture or suspicious osseous lesion. Ligamentum flavum ossification most notable on the right at C3-4. Prominent median C1-2 arthropathy with unchanged noncompressive partially calcified ligamentous thickening about the dens.  Soft tissues and spinal canal: No prevertebral fluid or swelling. No visible canal hematoma.  Disc levels: Moderate cervical disc and facet degeneration with disc bulging, uncovertebral spurring, and facet spurring resulting in severe neural foraminal stenosis on the left at C3-4 and bilaterally at C6-7.  Upper chest: Clear lung apices.  Other: Moderate calcific atherosclerosis at the carotid bifurcations.  IMPRESSION: 1. No evidence of acute intracranial abnormality. 2. 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 3. Moderate chronic small vessel ischemic disease. 4. No acute cervical spine fracture.   Electronically Signed   By: Logan Bores M.D.   On: 09/26/2021 12:57        PHYSICAL EXAM  Physical Exam  Constitutional: appears chronically ill, older male lying in bed. Psych: flat affect Eyes: L eye opaque, R pupil 16mm reactive, brisk, inconsistent blinking to visual threat HENT: poor dentition, L nare cortrack, midline trach Cardiovascular: Normal rate and regular rhythm.  Respiratory: Effort normal, non-labored breathing GI: Soft.  No distension. There is no tenderness. Condom catheter in place Skin: sacral pressure ulcer reported, LUE wound covered by dressing  Neuro:  Mental Status: Comatose, non verbal, no spontaneous movement. Grimaces to painful stimuli in all extremities. spontaneous blinking. No movement of extremities to pain   Cranial Nerves: II: Visual Fields are full. R pupil is round, and reactive to light. L eye UTA do to cataract.  III,IV, VI: EOMI without ptosis or diploplia.  V: Facial sensation is symmetric VII: Facial  movement is symmetric resting  VIII: No reaction to voice X: Cough and gag intact XI XII    ASSESSMENT/PLAN Mr. Jeffrey Mullen is a 70 y.o. male with history of alcohol use, anxiety/depression, congenital blindness of left eye, and seizures presenting with status epilepticus and now multifocal strokes. brought in by EMS as a code stroke after being found in a grocery store with right-sided gaze and weakness.  Head CT was negative for acute stroke or large vessel obstruction.  He had a witnessed seizure in CT, given 2 mg of Ativan and had apnea warranting intubation. He was loaded with keppra and fosphenytoin, given ativan, and started on propofol. 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   Stroke: bilateral anterior and posterior scattered infarcts, cardioembolic pattern, source unclear CT head- 10/31- no acute intracranial abnormality  CT Angio Head and Neck- 10/31 High-grade stenosis of the proximal left internal carotid artery at the bifurcation. Bilateral high-grade stenoses within the cavernous internal carotid arteries bilaterally. High-grade stenosis or occlusion of the distal right vertebral artery at the dura. CT perfusion demonstrates no acute infarct or significant  ischemia MRI brain - 11/1 - no acute intracranial process MRI head - 11/11- multiple scattered acute ischemic infarcts involving bilateral cerebral hemispheres and left pons 2D Echo - EF 60-65% Mildly dilated L atrium LDL 66 HgbA1c 6.0 Heparin subq for VTE prophylaxis No antithrombotic prior to admission, now on ASA 81.  Avoid DAPT due to need of rule out endocarditis.  Ongoing aggressive stroke risk factor management Therapy recommendations 11/15- TEE attempted, unable to intubate esophagus- recommend starting anticoagulation if stroke is thought to be cardioembolic- will discuss with CCM  Disposition- LTAC- case management to send referrals   Status epilepticus, improved No seizure seen on EEG since 11/3 meningitis and  encephalitis were ruled out with negative LP 11/2- normal opening pressure  Off LTM EEG On vimpat and keppra - continue  Respiratory failure Initially intubated for airway protection Tracheostomy 11/11 CCM on board SW working on placement  Hypertension Stable Long-term BP goal normotensive Previously hypertensive, now Labile BP-> BP meds decreased by CCM. Currently on clonidine 0.1mg  TID, Lisinopril 40mg  daily, Hydralazine 25mg  q6hr  Hyperlipidemia LDL 66, goal < 70 No statin now given elevated LFTs 171/119->164/126 May consider low dose statin once LFTs normalize  Other stroke risk factors PVD Advanced age Smoker  Other Active Problems Alcohol abuse with prior SDH Urinary retention- trial foley removal 11/14 Left eye blind with congenital glaucoma Skin tear left forearm, decubitus ulcer at bilateral heels and sacral region   Hospital day # [address], FNP-BC [facility]  ATTENDING NOTE: I reviewed above note and agree with the assessment and plan. Pt was seen and examined.   No family at bedside.  Dr. Debara Pickett from cardiology at bedside in preparation for TEE, however, it was unsuccessful with multiple attempts.  Patient eyes spontaneous open, seems more awake than before, however still nonverbal, not following commands, not interactive.  Right eye more consistently blinking to visual threat on the right but not to the left.  Slightly withdraw all extremities on pain stimulation.  Spiking fever overnight, T-max 102.3, blood cultures so far negative, UA negative.  CXR yesterday showed resolving pneumonia.  Not on any antibiotics.  Etiology for patient fever not quite clear, still concerning for infection.  In the setting of embolic shower, endocarditis still cannot be ruled out.  DVT negative so far, therefore, would not recommend anticoagulation at this time.  Continue aspirin 81 for now.  May consider further embolic work-up if patient has meaningful  neuro recovery.  We will discussed with CCM. LFTs pending in am  For detailed assessment and plan, please refer to above as I have made changes wherever appropriate.   Rosalin Hawking, MD PhD Stroke Neurology 11/07/2021 7:05 PM  This patient is critically ill due to embolic shower, status epilepticus, fever, respiratory failure on tracheostomy and at significant risk of neurological worsening, death form recurrent stroke, status epilepticus, endocarditis, sepsis, heart failure. This patient's care requires constant monitoring of vital signs, hemodynamics, respiratory and cardiac monitoring, review of multiple databases, neurological assessment, discussion with family, other specialists and medical decision making of high complexity. I spent 35 minutes of neurocritical care time in the care of this patient.  I discussed with Dr. Debara Pickett cardiology.  To contact Stroke Continuity provider, please refer to [email or web address] After hours, contact General Neurology

## 2021-11-07 NOTE — Progress Notes (Signed)
Discussed with RN protocol consult for US guided IV due to vasopressor order. Pt currently has working PICC line due to limited veinous access and prolonged stay.

## 2021-11-07 NOTE — Progress Notes (Addendum)
NAME:  Jeffrey Mullen, MRN:  [id number], DOB:  June 07, 1951, LOS: 16 ADMISSION DATE:  10/23/2021, CONSULTATION DATE:  10/31 REFERRING MD:  Dr. Dina Rich, CHIEF COMPLAINT:  AMS   History of Present Illness:  70 y/o M who presented to [facility] ER on 10/31 via EMS with reports of altered mental status.  Given  right sided gaze, altered and left sided weakness, code stoke called. The patient was taken emergently to CT for imaging which was negative for LVO.  While in the [place], he had a witnessed seizure.    PCCM called for ICU admission.   Chart review shows he was seen in the ER on 10/4 after passing out at the [employer].  Physical exam was negative at the time.  Labs negative.  No further imaging obtained.   Pertinent  Medical History  Alcohol Use - unclear quantity Anxiety / Depression  Congenital blindness of left eye, cataract on R Hepatitis C+, treated in 2016 Seizures   Significant Hospital Events: Including procedures, antibiotic start and stop dates in addition to other pertinent events   10/31 Admit with AMS, seizure, hypertensive emergency, AKI 11/1 MRI brain no acute intracranial process. No etiology is seen for the patient's seizure. 11/02 bedside LP attempted but unsuccessful  11/03  No seizures seen on EEG overnight  11/04 weaning sedation to assess for seizure reoccurrence  11/9 bronchoscopy with noted thick secretions in LLL 11/10 Left PICC placed 11/11 Trach,  Cor Track placed, New Embolic Strokes per MRI 99/35 Post Trach Day 1, some oozing from site, Echo results noted below 11/13 Low grade fever, tachy, LFT's remain elevated with ALT and Alk Phos continuing to rise.  S/p lasix  11/14 weaned PSV 10/5, no changes, foley removed  Last Blood Cx 11/8 Neg Last sputum Cx ( BAL) >> 11/9 >> No growth Tracheal Aspirate 11/8>> Normal Flora Trach asp 11/13 >  11/13 BC x1 >>  MRI Brain 11/11 Interval development of multiple scattered acute ischemic infarcts involving the  bilateral cerebral hemispheres and left pons, largest measuring 4.9 cm at the right occipital lobe. A central thromboembolic etiology is suspected given the various vascular distributions involved.  Echo 11/11 ECHO reviewed  - Left ventricle: The cavity size was normal. There was moderate    concentric hypertrophy. Systolic function was normal. The    estimated ejection fraction was in the range of 60% to 65%. Wall    motion was normal; there were no regional wall motion    abnormalities. Features are consistent with a pseudonormal left    ventricular filling pattern, with concomitant abnormal relaxation    and increased filling pressure (grade 2 diastolic dysfunction).  - Aortic valve: There was no significant regurgitation.  - Mitral valve: There was no significant regurgitation.  - Left atrium: The atrium was mildly dilated.  - Right ventricle: Systolic function was normal.  - Atrial septum: No defect or patent foramen ovale was identified.  - Tricuspid valve: There was no significant regurgitation.  - Pulmonic valve: There was no significant regurgitation.  - Inferior vena cava: The vessel was normal in size. The    respirophasic diameter changes were in the normal range (>= 50%),    consistent with normal central venous pressure.   RUQ limited US 11/13 > Heterogeneous increased hepatic parenchymal echogenicity typical of steatosis or other intrinsic hepatocellular disease. Nodular hepatic contours suggesting cirrhosis.  Unremarkable sonographic appearance of the gallbladder and biliary tree.  Interim History / Subjective:  Currently  on trach collar 40% doing well Worked with SLP - said he tried to vocalize his name and was able to move his LE to command Placed on Neo this morning for soft map in upper 50's/ lower 60's TEE planned today at 1130 Tmax 102.3 overnight   Objective   Blood pressure (!) 131/54, pulse 100, temperature 100.1 F (37.8 C), temperature source Axillary,  resp. rate (!) 22, height _0  (1.778 m), weight 90 kg, SpO2 100 %.    Vent Mode: PSV;CPAP FiO2 (%):  [40 %] 40 % PEEP:  [5 cmH20] 5 cmH20 Pressure Support:  [10 cmH20] 10 cmH20 Plateau Pressure:  [12 cmH20] 12 cmH20   Intake/Output Summary (Last 24 hours) at 11/07/2021 1015 Last data filed at 11/07/2021 0900 Gross per 24 hour  Intake 2087.7 ml  Output 1700 ml  Net 387.7 ml   Filed Weights   11/01/21 0500 11/03/21 1325 11/04/21 0400  Weight: 87.6 kg 87.7 kg 90 kg   Examination: General:  chronically ill older male sitting upright in bed in NAD on ATC HEENT: MM pink/moist, left nare cortrak, R pupil 4/reactive, left opaque, midline trach, sutured  Neuro:  Looks at me today, did not f/c or move spont for me.   CV: NSR/ ST, no murmur PULM:  non labored, rr ~20s on ATC 40%, scattered rhonchi, some thick white secretions, good cough/ clearance from trach GI: soft, bs+, NT, condom cath, flexi seal Extremities: warm/dry, upper extremity dependent edema 2+, minimal LE, LUE with dressing, prevalon boots in place Skin: no rashes, posterior not visualized   Micro reviewed: trach asp 11/13 > normal flora, Bcx1 11/13 ngtd  Labs reviewed: Na 152 > 153 > 152, K 3.8 > 3.4, CL 118 > 116, sCr 1.08 > 0.99 > 0.94  UOP 2.3 L/ 24hrs Net +10.4  Resolved Hospital Problem list   Hyperkalemia  Assessment & Plan:   Status epilepticus, improved -No seizure seen on EEG since 11/3 -meningitis and encephalitis was ruled out with negative LP 11/2 -MRI 11/1 with no acute process  Alcohol abuse with prior SDH New Embolic Stroke 97/67 per MRI HIV neg, RPR neg, TSH 4.708, UA neg, MMA pending, B12 wnl Plan -Neurology Following, appreciate input - etiology of new embolic strokes unclear - TEE today per Cards, at bedside >> cards unable to pass TEE probe - telemetry reviewed, remains NSR - AED per neurology, remains on lacosamide and keppra - Seizure precautions  - continues on ASA, plavix held for  potential IE etiology - Goal SBP <160, avoid hypotension.  - Trend Mag , Goal > 2 , replete as needed - continue MVI, thiamine, folate  Acute Hypoxic Respiratory Failure  RLL Pneumonia, Haemophilus influenzae Trach 11/11  Plan -continue ATC as tolerated, full MV if needed - VAP prevention protocol/ PPI - ongoing trach care - continue mucinex, BD - BAL 11/9 neg.  Trach asp 11/13 > neg  AKI due to contrast-induced ATN, resolved Baseline sr cr ~0.9 to 1  Nontraumatic rhabdomyolysis, resolved Non-anion Gap Metabolic Acidosis, resolved Hypokalemia/hypernatremia due to aggressive diuresis  Hypophosphatemia, resolved Hypernatremia Urinary retention Plan - Continue free water 200 cc's Q 4, - holding diuresis given softer BPs - strict I/Os, trending renal indices - monitor for urinary retention  Febrile State  Mild Leucocytosis > ?neuro fever Plan - Trend WBC and Fever Curve - consider neuro fever - will check extremity dopplers to rule out DVT - follow 11/13 BC and trach asp neg - will discuss with  Dr. Lamonte Sakai, but holding on abx for now.  If spikes temp again, will need to re-pan culture  Elevated LFT's  Total Bili normal Hx of HCV (treated) - RUQ limited US 11/13 > Heterogeneous increased hepatic parenchymal echogenicity typical of steatosis or other intrinsic hepatocellular disease. Nodular hepatic contours suggesting cirrhosis.  Unremarkable sonographic appearance of the gallbladder and biliary tree. Plan - repeat LFT in am   Prediabetes hemoglobin A1c 6.0 Hyperglycemia  Plan - holding SSI given hypoglycemic event overnight - continue CBG q 4, add SSI back when > 180  Vasopressor Extravasation Skin Injury - left hand/arm S/p phentolamine and nitroglycerin application Left Arm Skin Tear>> skin is sluffing with pink granulating tissue beneath Unstageable sacral ulcer (POA)>> Progression despite topical barrier treatment  Plan - frequent turns - Continue dressing  changes to L forearm - WOC   Uncontrolled Hypertension Labile BP at times  Plan - currently almost off Neo, MAP goal >65 - bp meds reduced on 11/12 due to lower BP.  Will hold lisinopril, reduce hydralazine 25-> 12.5 mg q6, continue clonidine 0.1 mg TID, amlodipine 10> 5 mg daily >> restart bp meds when off vasopressor - prn IV hydralazine and labetalol as needed   Complex social dynamics Patient's daughter does not want to be decision-maker for him, she made her mother, [name], his ex-significant other to be the primary decision maker.  Best Practice (right click and "Reselect all SmartList Selections" daily)   Diet/type: tubefeeds DVT prophylaxis: prophylactic heparin  GI prophylaxis: PPI Lines: Central line- PICC Foley:  condom cath Code Status:  full code Pending update 11/15- attempted to call [name] at [phone] for update, unable to reach at this time.   Total critical care time: 35 minutes     Kennieth Rad, ACNP [facility] 11/07/2021, 10:15 AM  See Amion for pager If no response to pager, please call PCCM consult pager After 7:00 pm call Elink

## 2021-11-07 NOTE — CV Procedure (Signed)
   TRANSESOPHAGEAL ECHOCARDIOGRAM (TEE) NOTE  INDICATIONS: Cryptogenic stroke  PROCEDURE:   Informed consent was obtained prior to the procedure. The risks, benefits and alternatives for the procedure were discussed and the patient comprehended these risks.  Risks include, but are not limited to, cough, sore throat, vomiting, nausea, somnolence, esophageal and stomach trauma or perforation, bleeding, low blood pressure, aspiration, pneumonia, infection, trauma to the teeth and death.    Procedure was performed at the ICU bedside with the ICU nurse present.  After a procedural time-out, the patient was incrementally given a total of 5 mg versed and 125 mcg fentanyl for moderate sedation.  The patient's heart rate, blood pressure, and oxygen saturation are monitored continuously during the procedure.  The transesophageal probe was inserted multiple times, however difficulty was encountered and I was not ultimately able to intubate the esophagus. Tracheostomy tube may have been an issue. Scant mucosal bleeding was noted at the end of the procedure and should be monitored. I was present face-to-face 100% of this time. The patient remained in the ICU unit.   COMPLICATIONS:    Unable to intubate the esophagus. Small amount of mucosal bleeding noted.  IMPRESSION:   Unsuccessful TEE attempt  RECOMMENDATIONS:    Would consider anticoagulation long-term if stroke felt to be cardioembolic. I reviewed the surface echo and the bubble study was not sufficient to rule out PFO, but did not clearly show a PFO either.  Time Spent Directly with the Patient:  30 minutes   Pixie Casino, MD, [facility], [place] Director of the Advanced Lipid Disorders &  Cardiovascular Risk Reduction Clinic Diplomate of the American Board of Clinical Lipidology Attending Cardiologist  Direct Dial: [phone]  Fax: [phone]  Website:  [email or web address]  Loula Marcella 11/07/2021, 12:32 PM

## 2021-11-07 NOTE — H&P (Signed)
   INTERVAL PROCEDURE H&P  History and Physical Interval Note:  11/07/2021 11:12 AM  [name] has presented today for their planned procedure (transesophageal echocardiogram). The various methods of treatment have been discussed with the patient and family. After consideration of risks, benefits and other options for treatment, the patient has consented to the procedure.  The patients' outpatient history has been reviewed, patient examined, and no change in status from most recent office note within the past 30 days. I have reviewed the patients' chart and labs and will proceed as planned. Questions were answered to the patient's satisfaction.   Pixie Casino, MD, [facility], [place] Director of the Advanced Lipid Disorders &  Cardiovascular Risk Reduction Clinic Diplomate of the American Board of Clinical Lipidology Attending Cardiologist  Direct Dial: [phone]  Fax: [phone]  Website:  [email or web address] 11/07/2021, 11:12 AM

## 2021-11-08 ENCOUNTER — Inpatient Hospital Stay (HOSPITAL_COMMUNITY): Payer: Medicare Other

## 2021-11-08 DIAGNOSIS — L8990 Pressure ulcer of unspecified site, unspecified stage: Secondary | ICD-10-CM | POA: Insufficient documentation

## 2021-11-08 DIAGNOSIS — J9601 Acute respiratory failure with hypoxia: Secondary | ICD-10-CM | POA: Diagnosis not present

## 2021-11-08 DIAGNOSIS — R569 Unspecified convulsions: Secondary | ICD-10-CM

## 2021-11-08 DIAGNOSIS — Z515 Encounter for palliative care: Secondary | ICD-10-CM

## 2021-11-08 DIAGNOSIS — J9602 Acute respiratory failure with hypercapnia: Secondary | ICD-10-CM | POA: Diagnosis not present

## 2021-11-08 DIAGNOSIS — N179 Acute kidney failure, unspecified: Secondary | ICD-10-CM

## 2021-11-08 DIAGNOSIS — G9340 Encephalopathy, unspecified: Secondary | ICD-10-CM | POA: Diagnosis not present

## 2021-11-08 DIAGNOSIS — Z9289 Personal history of other medical treatment: Secondary | ICD-10-CM | POA: Diagnosis not present

## 2021-11-08 DIAGNOSIS — Z789 Other specified health status: Secondary | ICD-10-CM

## 2021-11-08 DIAGNOSIS — Z7189 Other specified counseling: Secondary | ICD-10-CM

## 2021-11-08 DIAGNOSIS — G40901 Epilepsy, unspecified, not intractable, with status epilepticus: Secondary | ICD-10-CM | POA: Diagnosis not present

## 2021-11-08 DIAGNOSIS — I639 Cerebral infarction, unspecified: Secondary | ICD-10-CM | POA: Diagnosis not present

## 2021-11-08 LAB — HEPATIC FUNCTION PANEL
ALT: 150 U/L — ABNORMAL HIGH (ref 0–44)
AST: 188 U/L — ABNORMAL HIGH (ref 15–41)
Albumin: 1.8 g/dL — ABNORMAL LOW (ref 3.5–5.0)
Alkaline Phosphatase: 514 U/L — ABNORMAL HIGH (ref 38–126)
Bilirubin, Direct: 0.2 mg/dL (ref 0.0–0.2)
Indirect Bilirubin: 0.6 mg/dL (ref 0.3–0.9)
Total Bilirubin: 0.8 mg/dL (ref 0.3–1.2)
Total Protein: 7 g/dL (ref 6.5–8.1)

## 2021-11-08 LAB — CBC
HCT: 27.9 % — ABNORMAL LOW (ref 39.0–52.0)
Hemoglobin: 8.4 g/dL — ABNORMAL LOW (ref 13.0–17.0)
MCH: 27.2 pg (ref 26.0–34.0)
MCHC: 30.1 g/dL (ref 30.0–36.0)
MCV: 90.3 fL (ref 80.0–100.0)
Platelets: 445 10*3/uL — ABNORMAL HIGH (ref 150–400)
RBC: 3.09 MIL/uL — ABNORMAL LOW (ref 4.22–5.81)
RDW: 16.9 % — ABNORMAL HIGH (ref 11.5–15.5)
WBC: 11 10*3/uL — ABNORMAL HIGH (ref 4.0–10.5)
nRBC: 0 % (ref 0.0–0.2)

## 2021-11-08 LAB — RENAL FUNCTION PANEL
Albumin: 1.8 g/dL — ABNORMAL LOW (ref 3.5–5.0)
Anion gap: 7 (ref 5–15)
BUN: 22 mg/dL (ref 8–23)
CO2: 27 mmol/L (ref 22–32)
Calcium: 8.6 mg/dL — ABNORMAL LOW (ref 8.9–10.3)
Chloride: 120 mmol/L — ABNORMAL HIGH (ref 98–111)
Creatinine, Ser: 1.04 mg/dL (ref 0.61–1.24)
GFR, Estimated: >60 mL/min (ref >60)
Glucose, Bld: 106 mg/dL — ABNORMAL HIGH (ref 70–99)
Phosphorus: 3 mg/dL (ref 2.5–4.6)
Potassium: 3.4 mmol/L — ABNORMAL LOW (ref 3.5–5.1)
Sodium: 154 mmol/L — ABNORMAL HIGH (ref 135–145)

## 2021-11-08 LAB — GLUCOSE, CAPILLARY
Glucose-Capillary: 100 mg/dL — ABNORMAL HIGH (ref 70–99)
Glucose-Capillary: 124 mg/dL — ABNORMAL HIGH (ref 70–99)
Glucose-Capillary: 137 mg/dL — ABNORMAL HIGH (ref 70–99)
Glucose-Capillary: 138 mg/dL — ABNORMAL HIGH (ref 70–99)
Glucose-Capillary: 86 mg/dL (ref 70–99)
Glucose-Capillary: 93 mg/dL (ref 70–99)

## 2021-11-08 LAB — METHYLMALONIC ACID, SERUM: Methylmalonic Acid, Quantitative: 380 nmol/L — ABNORMAL HIGH (ref 0–378)

## 2021-11-08 LAB — PHOSPHORUS: Phosphorus: 3.1 mg/dL (ref 2.5–4.6)

## 2021-11-08 LAB — MAGNESIUM: Magnesium: 2.3 mg/dL (ref 1.7–2.4)

## 2021-11-08 LAB — PROCALCITONIN: Procalcitonin: 1.04 ng/mL

## 2021-11-08 MED ORDER — FREE WATER
300.0000 mL | Status: DC
Start: 2021-11-08 — End: 2021-11-09
  Administered 2021-11-08 – 2021-11-09 (×5): 300 mL

## 2021-11-08 MED ORDER — POTASSIUM CHLORIDE 10 MEQ/50ML IV SOLN
10.0000 meq | INTRAVENOUS | Status: AC
  Administered 2021-11-08 (×4): 10 meq via INTRAVENOUS
  Filled 2021-11-08: qty 50

## 2021-11-08 MED ORDER — PANTOPRAZOLE 2 MG/ML SUSPENSION
40.0000 mg | Freq: Every day | ORAL | Status: DC
  Administered 2021-11-08 – 2021-12-04 (×27): 40 mg
  Filled 2021-11-08 (×27): qty 20

## 2021-11-08 MED ORDER — POTASSIUM CHLORIDE 20 MEQ PO PACK
40.0000 meq | PACK | Freq: Once | ORAL | Status: AC
  Administered 2021-11-08: 40 meq
  Filled 2021-11-08: qty 2

## 2021-11-08 MED ORDER — FUROSEMIDE 10 MG/ML IJ SOLN
20.0000 mg | Freq: Once | INTRAMUSCULAR | Status: AC
  Administered 2021-11-08: 20 mg via INTRAVENOUS
  Filled 2021-11-08: qty 2

## 2021-11-08 MED ORDER — INSULIN ASPART 100 UNIT/ML IJ SOLN
0.0000 [IU] | INTRAMUSCULAR | Status: DC
Start: 2021-11-08 — End: 2021-12-08
  Administered 2021-11-08 – 2021-11-09 (×5): 2 [IU] via SUBCUTANEOUS
  Administered 2021-11-10 (×3): 3 [IU] via SUBCUTANEOUS
  Administered 2021-11-10 (×2): 2 [IU] via SUBCUTANEOUS
  Administered 2021-11-11: 3 [IU] via SUBCUTANEOUS
  Administered 2021-11-11: 2 [IU] via SUBCUTANEOUS
  Administered 2021-11-11: 3 [IU] via SUBCUTANEOUS
  Administered 2021-11-11: 2 [IU] via SUBCUTANEOUS
  Administered 2021-11-11: 3 [IU] via SUBCUTANEOUS
  Administered 2021-11-12: 2 [IU] via SUBCUTANEOUS
  Administered 2021-11-12: 3 [IU] via SUBCUTANEOUS
  Administered 2021-11-12 – 2021-11-13 (×3): 2 [IU] via SUBCUTANEOUS
  Administered 2021-11-13 (×2): 3 [IU] via SUBCUTANEOUS
  Administered 2021-11-13 – 2021-11-14 (×4): 2 [IU] via SUBCUTANEOUS
  Administered 2021-11-14: 3 [IU] via SUBCUTANEOUS
  Administered 2021-11-14 (×2): 2 [IU] via SUBCUTANEOUS
  Administered 2021-11-14 – 2021-11-15 (×3): 3 [IU] via SUBCUTANEOUS
  Administered 2021-11-15 (×3): 2 [IU] via SUBCUTANEOUS
  Administered 2021-11-15 – 2021-11-16 (×3): 3 [IU] via SUBCUTANEOUS
  Administered 2021-11-16: 2 [IU] via SUBCUTANEOUS
  Administered 2021-11-16: 3 [IU] via SUBCUTANEOUS
  Administered 2021-11-16: 2 [IU] via SUBCUTANEOUS
  Administered 2021-11-16 (×2): 3 [IU] via SUBCUTANEOUS
  Administered 2021-11-17 (×3): 2 [IU] via SUBCUTANEOUS
  Administered 2021-11-17 – 2021-11-19 (×10): 3 [IU] via SUBCUTANEOUS
  Administered 2021-11-19: 09:00:00 2 [IU] via SUBCUTANEOUS
  Administered 2021-11-20: 18:00:00 3 [IU] via SUBCUTANEOUS
  Administered 2021-11-20: 13:00:00 2 [IU] via SUBCUTANEOUS
  Administered 2021-11-20: 04:00:00 3 [IU] via SUBCUTANEOUS
  Administered 2021-11-21 (×4): 2 [IU] via SUBCUTANEOUS
  Administered 2021-11-21: 3 [IU] via SUBCUTANEOUS
  Administered 2021-11-22 (×4): 2 [IU] via SUBCUTANEOUS
  Administered 2021-11-23: 3 [IU] via SUBCUTANEOUS
  Administered 2021-11-23: 2 [IU] via SUBCUTANEOUS
  Administered 2021-11-23: 5 [IU] via SUBCUTANEOUS
  Administered 2021-11-23 – 2021-11-25 (×4): 2 [IU] via SUBCUTANEOUS
  Administered 2021-11-25: 5 [IU] via SUBCUTANEOUS
  Administered 2021-11-25: 3 [IU] via SUBCUTANEOUS
  Administered 2021-11-26: 04:00:00 2 [IU] via SUBCUTANEOUS
  Administered 2021-11-26: 21:00:00 3 [IU] via SUBCUTANEOUS
  Administered 2021-11-27: 2 [IU] via SUBCUTANEOUS
  Administered 2021-11-27: 3 [IU] via SUBCUTANEOUS
  Administered 2021-11-27 (×2): 2 [IU] via SUBCUTANEOUS
  Administered 2021-11-28 – 2021-11-29 (×4): 3 [IU] via SUBCUTANEOUS
  Administered 2021-11-29 (×3): 2 [IU] via SUBCUTANEOUS
  Administered 2021-11-30: 3 [IU] via SUBCUTANEOUS
  Administered 2021-11-30: 2 [IU] via SUBCUTANEOUS
  Administered 2021-11-30: 3 [IU] via SUBCUTANEOUS
  Administered 2021-12-01 (×2): 2 [IU] via SUBCUTANEOUS
  Administered 2021-12-01 (×3): 3 [IU] via SUBCUTANEOUS
  Administered 2021-12-02 (×2): 2 [IU] via SUBCUTANEOUS
  Administered 2021-12-02 – 2021-12-03 (×2): 3 [IU] via SUBCUTANEOUS
  Administered 2021-12-03: 2 [IU] via SUBCUTANEOUS
  Administered 2021-12-03: 5 [IU] via SUBCUTANEOUS
  Administered 2021-12-03 – 2021-12-05 (×5): 2 [IU] via SUBCUTANEOUS
  Administered 2021-12-05: 3 [IU] via SUBCUTANEOUS
  Administered 2021-12-06 (×2): 2 [IU] via SUBCUTANEOUS
  Administered 2021-12-06 – 2021-12-07 (×4): 3 [IU] via SUBCUTANEOUS
  Administered 2021-12-08: 2 [IU] via SUBCUTANEOUS
  Administered 2021-12-08: 3 [IU] via SUBCUTANEOUS

## 2021-11-08 NOTE — Progress Notes (Signed)
STROKE TEAM PROGRESS NOTE   SUBJECTIVE (INTERVAL HISTORY) No family at bedside, RN and PT at bedside.  Patient reclining in bed, eyes open, lethargic, not following commands.  Neuro no significant change.  TEE was unsuccessful yesterday.  Still has intermittent fever overnight, blood culture negative.   OBJECTIVE Vitals:   11/08/21 1057 11/08/21 1100 11/08/21 1108 11/08/21 1200  BP:  (!) 173/73 (!) 156/70   Pulse: (!) 122 (!) 119 (!) 111   Resp: (!) 24 (!) 23 (!) 31   Temp:    98.2 F (36.8 C)  TempSrc:    Axillary  SpO2: 94% 94% 94%   Weight:      Height:        CBC:  Recent Labs  Lab 11/05/21 0600 11/06/21 0615 11/08/21 0425  WBC 13.3* 16.1* 11.0*  NEUTROABS 10.4* 12.7*  --   HGB 8.6* 9.1* 8.4*  HCT 26.5* 28.0* 27.9*  MCV 85.2 86.2 90.3  PLT 280 348 445*    Basic Metabolic Panel:  Recent Labs  Lab 11/06/21 0615 11/07/21 0540 11/08/21 0425  NA 153* 152* 154*  K 3.8 3.4* 3.4*  CL 118* 116* 120*  CO2 28 28 27   GLUCOSE 135* 106* 106*  BUN 24* 24* 22  CREATININE 0.99 0.94 1.04  CALCIUM 8.8* 8.6* 8.6*  MG 2.3  --  2.3  PHOS 3.4 3.7 3.1  3.0    Lipid Panel:  Recent Labs  Lab 11/04/21 1243  CHOL 126  TRIG 237*  HDL 13*  CHOLHDL 9.7  VLDL 47*  LDLCALC 66   HgbA1c:  Lab Results  Component Value Date   HGBA1C 6.0 (H) 10/26/2021   Urine Drug Screen:     Component Value Date/Time   LABOPIA NONE DETECTED 10/23/2021 1520   COCAINSCRNUR NONE DETECTED 10/23/2021 1520   LABBENZ NONE DETECTED 10/23/2021 1520   AMPHETMU NONE DETECTED 10/23/2021 1520   THCU NONE DETECTED 10/23/2021 [place] DETECTED 10/23/2021 1520    Alcohol Level     Component Value Date/Time   ETH <10 10/23/2021 1618    IMAGING  Results for orders placed or performed during the hospital encounter of 10/23/21  MR BRAIN W WO CONTRAST   Narrative   CLINICAL DATA:  Seizure, nontraumatic  EXAM: MRI HEAD WITHOUT AND WITH CONTRAST  TECHNIQUE: Multiplanar, multiecho  pulse sequences of the brain and surrounding structures were obtained without and with intravenous contrast.  CONTRAST:  66mL GADAVIST GADOBUTROL 1 MMOL/ML IV SOLN  COMPARISON:  No prior MRI, correlation is made with CT head 10/23/2021  FINDINGS: Brain: No acute infarction, hemorrhage, hydrocephalus, extra-axial collection or mass lesion. No abnormal enhancement. T2 hyperintense signal in the periventricular white matter, likely the sequela of chronic small vessel ischemic disease. Left frontal encephalomalacia, related to remote intraparenchymal hemorrhage. Redemonstrated lacunar infarcts in the bilateral basal ganglia.  Vascular: Normal flow voids.  Skull and upper cervical spine: Normal marrow signal.  Sinuses/Orbits: Elongated left globe, which appears stable compared to remote exams. Status post bilateral lens replacements. The paranasal sinuses are clear.  Other: Mild fluid throughout the bilateral mastoid air cells.  IMPRESSION: No acute intracranial process. No etiology is seen for the patient's seizure.   Electronically Signed   By: Merilyn Baba M.D.   On: 10/24/2021 18:53   MR BRAIN WO CONTRAST   Narrative   CLINICAL DATA:  Initial evaluation for mental status change.  EXAM: MRI HEAD WITHOUT CONTRAST  TECHNIQUE: Multiplanar, multiecho pulse sequences of the  brain and surrounding structures were obtained without intravenous contrast.  COMPARISON:  Comparison made with prior MRI from 10/24/2021  FINDINGS: Brain: Mild age-related cerebral atrophy. Patchy and confluent T2/FLAIR hyperintensity involving the periventricular and deep white matter both cerebral hemispheres most consistent with chronic small vessel ischemic disease, mild in nature. Encephalomalacia and gliosis at the anterior/inferior left frontal lobe likely related to prior trauma. Mild cephalo malacia with chronic hemosiderin staining at the posterior right occipital region likely related to  prior trauma as well.  There has been interval development of multiple scattered ischemic infarcts involving the bilateral cerebral hemispheres and left pons. The largest area of infarction is present at the right occipital lobe and measures 4.9 cm, right PCA distribution (series 5, image 77). Mild petechial blood products present at this location. The left pontine infarct measures 1.1 cm (series 5, image 72). The remaining infarcts are largely subcentimeter in size and subcortical in location. Patchy involvement of the bilateral basal ganglia noted. No other associated hemorrhage or mass effect.  No mass lesion or significant mass effect. No midline shift or hydrocephalus. No extra-axial fluid collection. Pituitary gland suprasellar region normal. Midline structures intact.  Vascular: Major intracranial vascular flow voids are maintained.  Skull and upper cervical spine: Craniocervical junction within normal limits. Diffusely decreased T1 signal intensity within the bone marrow of the visualized upper cervical spine, nonspecific, but most commonly related to anemia, smoking from PCD. No focal marrow replacing lesion. No scalp soft tissue abnormality.  Sinuses/Orbits: Prior bilateral ocular lens replacement with asymmetric axial myopia involving the left lower. Probable chronic left orbital floor fracture noted. Globes orbital soft tissues demonstrate no acute finding. Scattered mucosal thickening noted within the ethmoidal air cells. Large bilateral mastoid effusions with fluid within the nasopharynx. Patient appears to be intubated.  Other: None.  IMPRESSION: 1. Interval development of multiple scattered acute ischemic infarcts involving the bilateral cerebral hemispheres and left pons, largest measuring 4.9 cm at the right occipital lobe. A central thromboembolic etiology is suspected given the various vascular distributions involved. 2. Otherwise stable appearance of the  brain with underlying chronic microvascular ischemic disease. Chronic encephalomalacia at the left frontal and right occipital lobes likely related to prior traumatic brain injury with coup/contrecoup pattern.   Electronically Signed   By: Jeannine Boga M.D.   On: 11/03/2021 06:02   Results for orders placed or performed during the hospital encounter of 09/26/21  CT Head Wo Contrast   Narrative   CLINICAL DATA:  Head trauma, mod-severe; Polytrauma, critical, head/C-spine injury suspected. Syncope. Neck pain.  EXAM: CT HEAD WITHOUT CONTRAST  CT CERVICAL SPINE WITHOUT CONTRAST  TECHNIQUE: Multidetector CT imaging of the head and cervical spine was performed following the standard protocol without intravenous contrast. Multiplanar CT image reconstructions of the cervical spine were also generated.  COMPARISON:  11/01/2018  FINDINGS: CT HEAD FINDINGS  Brain: There is no evidence of an acute infarct, intracranial hemorrhage, mass, midline shift, or extra-axial fluid collection. Encephalomalacia inferiorly in the left greater than right frontal lobes and in the posterior right occipital lobe is unchanged and consistent with the sequelae of remote trauma. Hypodensities elsewhere in the cerebral white matter bilaterally are unchanged and nonspecific but compatible with moderate chronic small vessel ischemic disease. The ventricles are normal in size.  Vascular: Calcified atherosclerosis at the skull base. No hyperdense vessel.  Skull: No acute fracture or suspicious osseous lesion. Partially visualized remote maxillofacial fractures.  Sinuses/Orbits: Visualized paranasal sinuses and mastoid air cells are clear.  Bilateral cataract extraction. Chronically enlarged left globe with history of glaucoma.  Other: None.  CT CERVICAL SPINE FINDINGS  Alignment: Straightening of the normal cervical lordosis. No listhesis.  Skull base and vertebrae: No acute fracture or  suspicious osseous lesion. Ligamentum flavum ossification most notable on the right at C3-4. Prominent median C1-2 arthropathy with unchanged noncompressive partially calcified ligamentous thickening about the dens.  Soft tissues and spinal canal: No prevertebral fluid or swelling. No visible canal hematoma.  Disc levels: Moderate cervical disc and facet degeneration with disc bulging, uncovertebral spurring, and facet spurring resulting in severe neural foraminal stenosis on the left at C3-4 and bilaterally at C6-7.  Upper chest: Clear lung apices.  Other: Moderate calcific atherosclerosis at the carotid bifurcations.  IMPRESSION: 1. No evidence of acute intracranial abnormality. 2. 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 3. Moderate chronic small vessel ischemic disease. 4. No acute cervical spine fracture.   Electronically Signed   By: Logan Bores M.D.   On: 09/26/2021 12:57        PHYSICAL EXAM  Physical Exam   Temp:  [98.2 F (36.8 C)-100.9 F (38.3 C)] 98.2 F (36.8 C) (11/16 1200) Pulse Rate:  [91-122] 119 (11/16 1400) Resp:  [22-33] 31 (11/16 1400) BP: (97-173)/(37-89) 140/63 (11/16 1400) SpO2:  [92 %-100 %] 98 % (11/16 1400) FiO2 (%):  [35 %-40 %] 35 % (11/16 1057) Weight:  [90.2 kg] 90.2 kg (11/16 0500)  General - Well nourished, well developed, on trach collar  Ophthalmologic - fundi not visualized due to noncooperation.  Cardiovascular - Regular rate and rhythm.  Neuro - patient on trach collar, eyes half way open spontaneously, on tracheostomy, not follow commands, nonverbal.  Left eye white membrane covering eyeball, right eye PERRL, more consistent blinking to visual threat on the right than the left.  With painful stimuli, slight withdrawing all extremities. Sensation, coordination and gait not tested.   ASSESSMENT/PLAN Jeffrey Mullen is a 70 y.o. male with history of alcohol use,  anxiety/depression, congenital blindness of left eye, and seizures presenting with status epilepticus and now multifocal strokes. brought in by EMS as a code stroke after being found in a grocery store with right-sided gaze and weakness.  Head CT was negative for acute stroke or large vessel obstruction.  He had a witnessed seizure in CT, given 2 mg of Ativan and had apnea warranting intubation. He was loaded with keppra and fosphenytoin, given ativan, and started on propofol. 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   Stroke: bilateral anterior and posterior scattered infarcts, cardioembolic pattern, source unclear CT head- 10/31- no acute intracranial abnormality  CT Angio Head and Neck- 10/31 High-grade stenosis of the proximal left internal carotid artery at the bifurcation. Bilateral high-grade stenoses within the cavernous internal carotid arteries bilaterally. High-grade stenosis or occlusion of the distal right vertebral artery at the dura. CT perfusion demonstrates no acute infarct or significant  ischemia MRI brain - 11/1 - no acute intracranial process MRI head - 11/11- multiple scattered acute ischemic infarcts involving bilateral cerebral hemispheres and left pons 2D Echo - EF 60-65% Mildly dilated L atrium TEE 11/15 attempted, unable to pass esophagus LDL 66 HgbA1c 6.0 Heparin subq for VTE prophylaxis No antithrombotic prior to admission, now on ASA 81. Avoid DAPT due to not able to completely rule out endocarditis. Will consider further embolic work up if significant neuro improvement Ongoing aggressive stroke risk factor management Therapy recommendations pending Disposition- LTAC- case management to  send referrals   Status epilepticus, improved No seizure seen on EEG since 11/3 meningitis and encephalitis were ruled out with negative LP 11/2- normal opening pressure  Off LTM EEG On vimpat and keppra - continue  Respiratory failure Initially intubated for airway protection Tracheostomy  11/11 CCM on board SW working on placement  Intermittent fever Can not rule out culture negative endocarditis T-max 102.3-> 100.9 Blood culture negative x2 New set of blood culture pending UA negative.   CXR 11/14 showed resolving pneumonia.   Not on antibiotics CCM on board  Hypertension Stable Long-term BP goal normotensive Previously hypertensive, now Labile BP-> BP meds decreased by CCM. Currently on clonidine 0.1mg  TID, Lisinopril 40mg  daily, Hydralazine 25mg  q6hr  Hyperlipidemia LDL 66, goal < 70 No statin now given elevated LFTs 171/119->164/126->188/150 May consider low dose statin once LFTs normalize  Other stroke risk factors PVD Advanced age Smoker  Other Active Problems Alcohol abuse with prior SDH Urinary retention- trial foley removal 11/14 Left eye blind with congenital glaucoma Skin tear left forearm, decubitus ulcer at bilateral heels and sacral region   Hospital day # 16  This patient is critically ill due to embolic shower, status epilepticus, fever,?  Culture-negative endocarditis, respiratory failure status post trach and at significant risk of neurological worsening, death form recurrent stroke, status epilepticus, heart failure, sepsis. This patient's care requires constant monitoring of vital signs, hemodynamics, respiratory and cardiac monitoring, review of multiple databases, neurological assessment, discussion with family, other specialists and medical decision making of high complexity. I spent 35 minutes of neurocritical care time in the care of this patient.  I discussed with Dr. Lamonte Sakai CCM.  Neurology will sign off. Please call with questions. Pt will follow up with stroke clinic NP at [facility] in about 4 weeks. Thanks for the consult.  Rosalin Hawking, MD PhD Stroke Neurology 11/08/2021 2:05 PM   To contact Stroke Continuity provider, please refer to [email or web address] After hours, contact General Neurology

## 2021-11-08 NOTE — Evaluation (Signed)
Speech Language Pathology Evaluation Patient Details Name: Jeffrey Mullen MRN: [id number] DOB: 07-05-1951 Today's Date: 11/08/2021 Time: 3295-1884 SLP Time Calculation (min) (ACUTE ONLY): 12 min  Problem List:  Patient Active Problem List   Diagnosis Date Noted   Pressure injury of skin 11/08/2021   AKI (acute kidney injury) ([place])    Seizure ([place])    Cryptogenic stroke ([place])    Acute respiratory failure with hypercapnia ([place])    Acute encephalopathy    Status epilepticus ([place]) 10/23/2021   Cataract Right eye 12/12/2018   Glaucoma, congenital, blind Left eye  12/12/2018   Lives in homeless shelter 12/12/2018   Shoulder pain, right 12/12/2018   Tobacco abuse    ETOH abuse    Benign essential HTN    Hepatic cirrhosis ([place]) 02/10/2015   Chronic hepatitis C without hepatic coma ([place]) 01/05/2015   Past Medical History:  Past Medical History:  Diagnosis Date   Alcohol abuse    Allergy    Anemia    Anxiety    Arthritis    Blind left eye    Cataract, right eye    Congenital blindness    LEFT EYE   Depression    Glaucoma, bilateral    Hepatic cirrhosis ([place])    Hepatitis    HEP C treated in 2016   HTN (hypertension)    Seizures ([place])    3 years ago   Past Surgical History:  Past Surgical History:  Procedure Laterality Date   CATARACT EXTRACTION Left    GLAUCOMA SURGERY     Patient reports 10 or eleven surgeries for Glaucoma   HPI:  70 y/o male presented to ED on 10/23/21 after bystanders witnessed patient collapse with associated seizure. Another seizure witnessed in ED. CT head negative. CT C-spine negative. LTM EEG on 11/1 showed 2 seizures from R anterior temporal region. Last seizure noted on EEG on 11/3. Intubated 10/31-11/11. Trach placed 11/11. 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 PMH: alcohol abuse, congenital blindness in L eye,  bilateral glaucoma, depression, anxiety, HTN, seizures   Assessment / Plan / Recommendation Clinical Impression  Pt on trach collar with PMV seen for a speech-language evaluation. Pt awake and seen alongside OT with pt at EOB for session, however pt lethargic, requiring intermittent cues to remain awake. Overall, pt exhibits deficits in attention, memory, and executive functioning alongside signficantly reduced speech intelligibiility d/t generalized oral motor weakness. Pt receptive/expressive language difficult to assess this date d/t confounding motor variables rendering pt with difficulty speaking/moving extremeties to follow simple one-step commands. Pt stated name and answered yes/no question independently x1. When provided with two choices and multiple prompt repetitions, pt accurately answered questions about current events. Pt oriented to self but disoriented to place and time, and will require further diagnostic therapy to assess language and cognition as alertness improves. SLP will continue to follow pt for therapy targeting speech intelligibility strategies and cognition.    SLP Assessment  SLP Recommendation/Assessment: Patient needs continued Speech Lanaguage Pathology Services SLP Visit Diagnosis: Cognitive communication deficit (R41.841);Dysarthria and anarthria (R47.1)    Recommendations for follow up therapy are one component of a multi-disciplinary discharge planning process, led by the attending physician.  Recommendations may be updated based on patient status, additional functional criteria and insurance authorization.    Follow Up Recommendations  Follow physician's recommendations for discharge plan and follow up therapies  Assistance Recommended at Discharge  Other (comment) (tbd)  Functional Status Assessment Patient has had a recent decline in their functional status and demonstrates the ability to make significant improvements in function in a reasonable and  predictable amount of time.  Frequency and Duration min 2x/week  2 weeks      SLP Evaluation Cognition  Overall Cognitive Status: No family/caregiver present to determine baseline cognitive functioning Arousal/Alertness: Lethargic Orientation Level: Oriented to person;Disoriented to place Attention: Sustained Sustained Attention: Impaired Sustained Attention Impairment: Verbal basic;Functional basic Memory: Impaired Memory Impairment: Decreased short term memory;Decreased recall of new information Decreased Short Term Memory: Verbal basic;Functional basic Awareness: Impaired Awareness Impairment: Intellectual impairment;Emergent impairment;Anticipatory impairment Executive Function: Writer: Impaired Organizing Impairment: Functional basic       Comprehension  Auditory Comprehension Overall Auditory Comprehension: Appears within functional limits for tasks assessed Yes/No Questions: Within Functional Limits (for biographical questions) Commands: Impaired One Step Basic Commands: Other (comment) (correct when responds, responds minimally) Conversation: Simple Interfering Components: Attention;Processing speed EffectiveTechniques: Extra processing time;Increased volume;Repetition;Slowed speech Visual Recognition/Discrimination Discrimination: Not tested Reading Comprehension Reading Status: Not tested    Expression Expression Primary Mode of Expression: Verbal Verbal Expression Overall Verbal Expression: Impaired Initiation: Impaired Level of Generative/Spontaneous Verbalization: Word Repetition: No impairment Naming: No impairment Interfering Components: Attention;Speech intelligibility Written Expression Written Expression: Not tested   Oral / Motor  Oral Motor/Sensory Function Overall Oral Motor/Sensory Function: Generalized oral weakness Motor Speech Overall Motor Speech: Impaired Respiration: Impaired Level of Impairment: Word Phonation:  Aphonic;Hoarse;Low vocal intensity Resonance: Within functional limits Articulation: Impaired Level of Impairment: Word Intelligibility: Intelligibility reduced Word: 25-49% accurate Phrase: Not tested Sentence: Not tested Conversation: Not tested Motor Planning: Witnin functional limits Effective Techniques: Increased vocal intensity   GO           [name], SLP-Student         [name] 11/08/2021, 4:17 PM

## 2021-11-08 NOTE — Progress Notes (Signed)
Speech Language Pathology Treatment: Nada Boozer Speaking valve  Patient Details Name: Jeffrey Mullen MRN: [id number] DOB: [birth year] Today's Date: 11/08/2021 Time: 0315-9458 SLP Time Calculation (min) (ACUTE ONLY): 30 min  Assessment / Plan / Recommendation Clinical Impression  Pt on trach collar was seen for therapy targeting PMV tolerance and aphonia in collaboration with OT. Pt moved to EOB by OT; residual air from deflated trach cuff removed and PMV donned, noting no change in sats throughout session (HR 122, SpO2 92-95, RR 25-29). PMV donned for ~15 minutes intermittently removed to ensure no air trapping was present. Pt expectorated secretions to oral cavity with PMV donned and required suction for mucous and excessive uncontrolled saliva while upright. Pt stated name and provided accurate answers to orientation questions provided 2 choices. Voice intensity very low and pt benefits from frequent cues to increase volume. Recommend PMV use with ST only under full supervision. SLP will continue to follow for therapy targeting PMV tolerance and compensatory strategies for speech intelligibility.    HPI HPI: 70 y/o male presented to ED on 10/23/21 after bystanders witnessed patient collapse with associated seizure. Another seizure witnessed in ED. CT head negative. CT C-spine negative. LTM EEG on 11/1 showed 2 seizures from R anterior temporal region. Last seizure noted on EEG on 11/3. Intubated 10/31-11/11. Trach placed 11/11. 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   Recommendations may be updated based on patient status, additional functional criteria and insurance authorization.    Recommendations         Patient may use Passy-Muir Speech Valve: with SLP only PMSV Supervision: Full MD: Please consider changing trach tube to : Cuffless         General recommendations: Rehab consult Oral Care Recommendations: Oral care QID Follow Up Recommendations: Follow physician's recommendations for discharge plan and follow up therapies Assistance recommended at discharge: Other (comment) (tbd) SLP Visit Diagnosis: Aphonia (R49.1) Plan: Continue with current plan of care       GO              [name], SLP-Student   [name]  11/08/2021, 3:36 PM

## 2021-11-08 NOTE — Procedures (Signed)
Cortrak ? ?Tube Type:  Cortrak - 43 inches ?Tube Location:  Left nare ?Initial Placement:  Stomach ?Secured by: Bridle ?Technique Used to Measure Tube Placement:  Marking at nare/corner of mouth ?Cortrak Secured At:  75 cm ? ?Cortrak Tube Team Note: ? ?Consult received to place a Cortrak feeding tube.  ? ?X-ray is required, abdominal x-ray has been ordered by the Cortrak team. Please confirm tube placement before using the Cortrak tube.  ? ?If the tube becomes dislodged please keep the tube and contact the Cortrak team at [email or web address] ([email or web address]) for replacement.  ?If after hours and replacement cannot be delayed, place a NG tube and confirm placement with an abdominal x-ray.  ? ? ?Candyce Gambino MS, RD, LDN ?Please refer to AMION for RD and/or RD on-call/weekend/after hours pager ? ? ?

## 2021-11-08 NOTE — Consult Note (Signed)
Consultation Note Date: 11/08/2021   Patient Name: Jeffrey Mullen  DOB: 1951-01-04  MRN: [id number]  Age / Sex: 70 y.o., male  PCP: [name], NP Referring Physician: Collene Gobble, MD  Reason for Consultation: Establishing goals of care  HPI/Patient Profile: 70 y.o. male  with past medical history of alcohol abuse, anxiety, and hepatitis C with treatment in the past admitted on 10/23/2021 with acute encephalopathy and found to have tonic-clonic seizure requiring intubation and airway protection.   Tracheostomy was placed on 11/11.  TEE was attempted on 11/15 but unsuccessful.  Patient continues to have intermittent fevers without clear etiology.  He also continues to have copious oral and tracheal secretions but no clear purulence and follow-up respiratory cultures have been negative to date.  Palliative medicine team was consulted to help determine goals of care and decision maker in light of complex family dynamics.  Clinical Assessment and Goals of Care: I have reviewed medical records including EPIC notes, labs and imaging, received report from bedside nurse Merrilee Seashore, assessed the patient and then spoke with patient's ex significant other Jeffrey Mullen over the telephone to discuss diagnosis prognosis, [place], EOL wishes, disposition and options.  I introduced Palliative Medicine as specialized medical care for people living with serious illness. It focuses on providing relief from the symptoms and stress of a serious illness. The goal is to improve quality of life for both the patient and the family.  We discussed a brief life review of the patient.  Jeffrey Mullen states she was with the patient for over 20 years.  They have 1 daughter, Jeffrey Mullen, together.  Jeffrey Mullen has been estranged from the patient.  As per previous notes and Lisa's comments the patient's daughter does not want to have anything to do with the patient and  does not want to make any medical decisions for him.  As far as functional and nutritional status prior to admission Jeffrey Mullen is unable to provide significant descriptions of his ADLs.  We discussed patient's current illness and what it means in the larger context of patient's on-going co-morbidities.  I outlined his multiple scattered acute ischemic infarcts as well as his respiratory challenges with the trach and increased oral secretions.  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 I outlined that his lab work shows low albumin and elevated liver enzymes.  I highlighted that the larger picture is that the patient is not making any significant neurological progression.  I shared my concern that he may not have a meaningful recovery.  Jeffrey Mullen shares that she was aware that he had a stroke but was not aware of any other issues.  I attempted to elicit values and goals of care important to the patient.  Jeffrey Mullen shared she never had a discussion with the patient regarding advance care planning, goals of care, use of ventilator or CODE STATUS.  The difference between aggressive medical intervention and comfort care was considered in light of the patient's goals of care.  Jeffrey Mullen shares that she  does not want to make any changes to the patient's current plan of care.  She wants to keep him alive as long as possible or until someone in his family makes a decision otherwise.  Jeffrey Mullen shared that the patient has a brother that lives in [place], [employer], as well as an uncle, Goerge Mohr, a niece in [place], Donatello Kleve, and the nieces mother, Nyquan Selbe, who also lives in [place].  She only has a phone number for Jeffrey Mullen.  I attempted to speak with Jamilla and there was no answer and no ability to leave a voicemail.  Advance directives, concepts specific to code status, artificial feeding and hydration, and rehospitalization were considered and discussed.  Shared that she does not want to  make these decisions.  Should she be the only 1 who visits and is part of the patient's life then she wishes for a guardian to take over and make decisions for the patient.  Discussed with patient/family the importance of continued conversation with family and the medical providers regarding overall plan of care and treatment options, ensuring decisions are within the context of the patient's values and GOCs.   Questions and concerns were addressed. The family was encouraged to call with questions or concerns.   Jeffrey Mullen plans to visit the patient tomorrow sometime after 12 noon.  I shared that I am off service tomorrow that a colleague will be available to speak with her when she visits with the patient.  Primary Decision Maker TBD Daughter - declines to make decisions Brother - Gilmore List - no contact info. Niece - Jeffrey Mullen - may have contact info for [employer], but no answer and unable to leave VM when called, her cell is [phone] [place] mother (wife of patient's brother) is Jeffrey Mullen - also no contact info for her  Code Status/Advance Care Planning: Full code  Prognosis:   Unable to determine  Discharge Planning: To Be Determined  Primary Diagnoses: Present on Admission:  Status epilepticus [facility])   Physical Exam Vitals and nursing note reviewed.  Constitutional:      Appearance: He is ill-appearing and toxic-appearing.  HENT:     Head: Normocephalic and atraumatic.     Mouth/Throat:     Mouth: Mucous membranes are moist.  Cardiovascular:     Rate and Rhythm: Normal rate.     Pulses: Normal pulses.  Pulmonary:     Breath sounds: Rhonchi present. No wheezing.     Comments: trach Abdominal:     Palpations: Abdomen is soft.  Skin:    General: Skin is warm and dry.  Neurological:     Comments: Non verbal, non responsive and off of sedation    Vital Signs: BP 140/63   Pulse (!) 119   Temp 98.2 F (36.8 C) (Axillary)   Resp (!) 31   Ht  5\' 10"  (1.778 m)   Wt 90.2 kg   SpO2 98%   BMI 28.53 kg/m  Pain Scale: CPOT   Pain Score:  (for fever) SpO2: SpO2: 98 % O2 Device:SpO2: 98 % O2 Flow Rate: .O2 Flow Rate (L/min): 8 L/min  Palliative Assessment/Data: 10%     I discussed this patient's plan of care with patient's ex-significant other LIsa, SW Percell Locus, bedside RN Merrilee Seashore.  Thank you for this consult. Palliative medicine will continue to follow and assist holistically.   Time Total: 70 minutes Greater than 50%  of this time was spent counseling and coordinating care related to  the above assessment and plan.  Signed by: Jordan Hawks, DNP, FNP-BC Palliative Medicine    Please contact Palliative Medicine Team phone at [phone] for questions and concerns.  For individual provider: See Shea Evans

## 2021-11-08 NOTE — TOC Progression Note (Signed)
Transition of Care [facility]) - Progression Note    Patient Details  Name: Jeffrey Mullen MRN: [id number] Date of Birth: 14-Jul-1951  Transition of Care [facility]) CM/SW [place], LCSW Phone Number: 11/08/2021, 2:04 PM  Clinical Narrative:    CSW following patient for disposition needs. Of note, patient's trach will need to be at least 28 days old before SNF would consider him.         Expected Discharge Plan and Services                                                 Social Determinants of Health (SDOH) Interventions    Readmission Risk Interventions No flowsheet data found.

## 2021-11-08 NOTE — Progress Notes (Signed)
NAME:  Jeffrey Mullen, MRN:  [id number], DOB:  November 06, 1951, LOS: 75 ADMISSION DATE:  10/23/2021, CONSULTATION DATE:  10/31 REFERRING MD:  Dr. Dina Rich, CHIEF COMPLAINT:  AMS   History of Present Illness:  70 y/o M who presented to [facility] ER on 10/31 via EMS with reports of altered mental status.  Given  right sided gaze, altered and left sided weakness, code stoke called. The patient was taken emergently to CT for imaging which was negative for LVO.  While in the [place], he had a witnessed seizure.    PCCM called for ICU admission.   Chart review shows he was seen in the ER on 10/4 after passing out at the [employer].  Physical exam was negative at the time.  Labs negative.  No further imaging obtained.   Pertinent  Medical History  Alcohol Use - unclear quantity Anxiety / Depression  Congenital blindness of left eye, cataract on R Hepatitis C+, treated in 2016 Seizures   Significant Hospital Events: Including procedures, antibiotic start and stop dates in addition to other pertinent events   10/31 Admit with AMS, seizure, hypertensive emergency, AKI 11/1 MRI brain no acute intracranial process. No etiology is seen for the patient's seizure. 11/02 bedside LP attempted but unsuccessful  11/03  No seizures seen on EEG overnight  11/04 weaning sedation to assess for seizure reoccurrence  11/9 bronchoscopy with noted thick secretions in LLL 11/10 Left PICC placed 11/11 Trach,  Cor Track placed, New Embolic Strokes per MRI 24/23 Post Trach Day 1, some oozing from site, Echo results noted below 11/13 Low grade fever, tachy, LFT's remain elevated with ALT and Alk Phos continuing to rise.  S/p lasix  11/14 weaned PSV 10/5, no changes, foley removed 11/15 on ATC, TEE attempted- unable to pass probe, briefly on Neo, cortrak removed fort TEE, tmax 102.3   Last Blood Cx 11/8 Neg Last sputum Cx ( BAL) >> 11/9 >> No growth Tracheal Aspirate 11/8>> Normal [place]ora Trach asp 11/13 > ngtd 11/13  BC x1 >> ngtd 11/16 trach asp >> 11/16 Bcx 2 >>  MRI Brain 11/11 Interval development of multiple scattered acute ischemic infarcts involving the bilateral cerebral hemispheres and left pons, largest measuring 4.9 cm at the right occipital lobe. A central thromboembolic etiology is suspected given the various vascular distributions involved.  Echo 11/11 ECHO reviewed  - Left ventricle: The cavity size was normal. There was moderate    concentric hypertrophy. Systolic function was normal. The    estimated ejection fraction was in the range of 60% to 65%. Wall    motion was normal; there were no regional wall motion    abnormalities. Features are consistent with a pseudonormal left    ventricular filling pattern, with concomitant abnormal relaxation    and increased filling pressure (grade 2 diastolic dysfunction).  - Aortic valve: There was no significant regurgitation.  - Mitral valve: There was no significant regurgitation.  - Left atrium: The atrium was mildly dilated.  - Right ventricle: Systolic function was normal.  - Atrial septum: No defect or patent foramen ovale was identified.  - Tricuspid valve: There was no significant regurgitation.  - Pulmonic valve: There was no significant regurgitation.  - Inferior vena cava: The vessel was normal in size. The    respirophasic diameter changes were in the normal range (>= 50%),    consistent with normal central venous pressure.   RUQ limited US 11/13 > Heterogeneous increased hepatic parenchymal echogenicity typical of  steatosis or other intrinsic hepatocellular disease. Nodular hepatic contours suggesting cirrhosis.  Unremarkable sonographic appearance of the gallbladder and biliary tree.  Interim History / Subjective:  Back on ATC  Copious trach secretions- thick white Tmax 100.9  Objective   Blood pressure (!) 150/56, pulse (!) 112, temperature 98.3 F (36.8 C), temperature source Axillary, resp. rate (!) 22, height _0   (1.778 m), weight 90.2 kg, SpO2 97 %.    FiO2 (%):  [35 %-40 %] 35 %   Intake/Output Summary (Last 24 hours) at 11/08/2021 0915 Last data filed at 11/08/2021 0800 Gross per 24 hour  Intake 911.62 ml  Output 2050 ml  Net -1138.38 ml   Filed Weights   11/03/21 1325 11/04/21 0400 11/08/21 0500  Weight: 87.7 kg 90 kg 90.2 kg   Examination: General:  chronically ill appearing older male sitting upright in bed in NAD HEENT: MM pink/moist, R pupils 3/reactive, L opaque, midline trach- sutured, copious secretions from trach, site ok, poor dentition Neuro: open eyes to noxious stimuli but doesn't appear to track, doesn't follow commands, otherwise no response to noxious stimuli, no spont movement CV: ST 107 PULM:  mildly tachypneic at times in the 20's, coarse breath sounds, diminished in bases, remains on ATC GI: soft, bs+, NT/ ND, condom cath, flexi seal since removed Extremities: warm/dry, +1-2 UE edema, LUE picc site wnl, LUE with kerlix dressing- dry, posterior not examined  UOP 1.9L +1 unmeasured occurrence   Micro reviewed- still ngtd on trach asp or BC (11/13)  Labs reviewed: Na 152 > 154, K 3.4, Cl 116 > 120, sCr 0.94 > 1.04, alk phos 518 > 514, AST  164 > 188, ALT 126 > 150, WBC 16.1 > 11, Hgb 9.1 > 8.4  Resolved Hospital Problem list   Hyperkalemia  Assessment & Plan:   Status epilepticus, improved -No seizure seen on EEG since 11/3 -meningitis and encephalitis was ruled out with negative LP 11/2 -MRI 11/1 with no acute process  Alcohol abuse with prior SDH 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  Has been febrile but cultures negative to date.  TEE attempted 11/15 but was technically difficult and scope could not be passed.  Anticoagulation to be discussed with neurology.  Also could these new stroke on MRI  be from hypoperfusion due to hypotension, will discuss with neurology - AED per neurology, remains on lacosamide and keppra - Seizure precautions  - continues on ASA, plavix held at this time - long term BP goal normotensive - continue MVI, thiamine, folate - has not made any significant neuro progression.  Considering PMT consult to help establish longterm GOC given complex family dynamics.   Acute Hypoxic Respiratory Failure  RLL Pneumonia, Haemophilus influenzae Trach 11/11  Abx completed 11/12 Plan - continue ATC since 11/15 am.  MV support as needed  - continue VAP prevention protocol/ PPI - ongoing trach care, sutures will need to be removed 7-10 days after insertion - continue mucinex, BD - copious ongoing secretions-> resending trach asp  AKI due to contrast-induced ATN, resolved Baseline sr cr ~0.9 to 1  Nontraumatic rhabdomyolysis, resolved Non-anion Gap Metabolic Acidosis, resolved Hypokalemia/hypernatremia due to aggressive diuresis  Hypophosphatemia, resolved Hypernatremia Urinary retention Plan - Increase free water 200-> 300 cc's Q 4 hr - lasix 31m x 1 - strict I/Os, trending renal indices -  monitor for urinary retention, continue condom cath  Febrile State  Mild Leucocytosis > ?neuro fever Plan - Trend WBC and Fever Curve - considering neuro fever given cultures negative to date.  Unable to r/o endocarditis  - extremity dopplers neg 11/11 for DVT - UA neg 11/14 - following culture data, resend [facility] and trach asp today  - holding abx today - PICC inserted 11/10, site ok, if we can establish PIV's given upper extremity dependent edema and replace cortak for med/ nutrition access, we can d/c PICC for line holiday   Elevated LFT's  Total Bili normal Hx of HCV (treated) - RUQ limited US 11/13 > Heterogeneous increased hepatic parenchymal echogenicity typical of steatosis or other intrinsic hepatocellular disease. Nodular hepatic contours suggesting cirrhosis.   Unremarkable sonographic appearance of the gallbladder and biliary tree. - ammonia 17 on 11/13 Plan - LFTs remain elevated but mostly stable.  He has hx of HCV, reportedly treated.  [place] as above suggestive of cirrhosis.  Can check HCV RNA to see if currently infected.   Prediabetes hemoglobin A1c 6.0 Hyperglycemia  Plan - insulin/ SSI stopped 11/15 due to hypoglycemia, NPO, and loss of enteral access  - continue CBG q4, add SSI back if > 180  Vasopressor Extravasation Skin Injury - left hand/arm S/p phentolamine and nitroglycerin application Left Arm Skin Tear>> skin is sluffing with pink granulating tissue beneath Unstageable sacral ulcer (POA)>> Progression despite topical barrier treatment  Plan - frequent turns - Continue dressing changes to L forearm and sacrum - new pressure injury noted to buttocks post since flexi seal removal  - WOC   Uncontrolled Hypertension Labile BP at times  Plan - pending cortrak replacement and can resume enteral meds.   - prn hydralazine and labetalol as needed - permissive longterm goal   Normocytic anemia - Hgb remains stable.  No evidence of bleeding - trend CBC, transfuse for > 7  Complex social dynamics Patient's daughter does not want to be decision-maker for him, she made her mother, [name], his ex-significant other to be the primary decision maker.  Best Practice (right click and "Reselect all SmartList Selections" daily)   Diet/type: tubefeeds- restart after cortrak insertion DVT prophylaxis: prophylactic heparin  GI prophylaxis: PPI Lines: Central line- LUE PICC (inserted 11/10) Foley:  condom cath Code Status:  full code Pending update 11/15- attempted to call [name] at [phone] for update, unable to reach at this time.  Will try again today.    Total critical care time:  n/a     Kennieth Rad, ACNP [facility] 11/08/2021, 9:15 AM  See Amion for pager If no response to pager, please call PCCM  consult pager After 7:00 pm call Elink

## 2021-11-08 NOTE — Progress Notes (Signed)
[facility] ADULT ICU REPLACEMENT PROTOCOL   The patient does apply for the [facility] Adult ICU Electrolyte Replacment Protocol based on the criteria listed below:   1.Exclusion criteria: TCTS patients, ECMO patients, and Dialysis patients 2. Is GFR >/= 30 ml/min? Yes.    Patient's GFR today is >60 3. Is SCr </= 2? Yes.   Patient's SCr is 1.04 mg/dL 4. Did SCr increase >/= 0.5 in 24 hours? No. 5.Pt's weight >40kg  Yes.   6. Abnormal electrolyte(s): K+ 3.4  7. Electrolytes replaced per protocol 8.  Call MD STAT for K+ </= 2.5, Phos </= 1, or Mag </= 1 Physician:  n/a  Jeffrey Mullen 11/08/2021 6:35 AM

## 2021-11-08 NOTE — Progress Notes (Signed)
Occupational Therapy Treatment Patient Details Name: Jeffrey Mullen MRN: [id number] DOB: 1951/07/04 Today's Date: 11/08/2021   History of present illness 70 y/o male presented to ED on 10/23/21 after bystanders witnessed patient collapse with associated seizure. Another seizure witnessed in ED. CT head negative. CT C-spine negative. LTM EEG on 11/1 showed 2 seizures from R anterior temporal region. Last seizure noted on EEG on 11/3. Intubated 10/31-11/11. Trach placed 11/11. 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 Pt on trach collar and SLP present to trial PMV ( see notes) pt does not activate any extremities during transfers or to command. Pt remains dependent for all care but does verbalize name when asked. Pt with delayed responses time. Recommendation remains SNF with trach management requirements.    Recommendations for follow up therapy are one component of a multi-disciplinary discharge planning process, led by the attending physician.  Recommendations may be updated based on patient status, additional functional criteria and insurance authorization.    Follow Up Recommendations  OT at Long-term acute care hospital    Assistance Recommended at Discharge Frequent or constant Supervision/Assistance  Equipment Recommendations  Wheelchair (measurements OT);Wheelchair cushion (measurements OT);Hospital bed;Other (comment)    Recommendations for Other Services Other (comment);Speech consult    Precautions / Restrictions Precautions Precautions: Fall Precaution Comments: trach, cortrak seizure       Mobility Bed Mobility Overal bed mobility: Needs Assistance Bed Mobility:  Rolling;Supine to Sit;Sit to Supine Rolling: +2 for physical assistance;Total assist   Supine to sit: +2 for physical assistance;Total assist;HOB elevated Sit to supine: Total assist;+2 for physical assistance;HOB elevated   General bed mobility comments: dependent on therapist for all aspects    Transfers                   General transfer comment: no appropriate. pt could trial hoyer lift next session with geo mat cushion for pressure relief     Balance Overall balance assessment: Needs assistance Sitting-balance support: No upper extremity supported;Feet supported Sitting balance-Leahy Scale: Zero                                     ADL either performed or assessed with clinical judgement   ADL Overall ADL's : Needs assistance/impaired                                       General ADL Comments: total    Extremity/Trunk Assessment Upper Extremity Assessment Upper Extremity Assessment: LUE deficits/detail;RUE deficits/detail RUE Deficits / Details: withdrawal to nail bed pressure only RUE Sensation: decreased light touch;decreased proprioception RUE Coordination: decreased fine motor;decreased gross motor LUE Deficits / Details: no response to pain LUE Sensation: decreased light touch LUE Coordination: decreased fine motor;decreased gross motor   Lower Extremity Assessment Lower Extremity Assessment: Defer to PT evaluation        Vision   Additional Comments: pt L eye is grey and appears to be blind. The R eye with R gaze preference does not scan to midline   Perception     Praxis  Cognition Arousal/Alertness: Awake/alert Behavior During Therapy: Flat affect Overall Cognitive Status: No family/caregiver present to determine baseline cognitive functioning                                 General Comments: pt verbalized name when asked states Jeffrey Mullen" and reports "ouch that hurts " with sensory testing           Exercises     Shoulder Instructions       General Comments      Pertinent Vitals/ Pain       Pain Assessment: Faces Faces Pain Scale: Hurts little more Facial Expression: Relaxed, neutral Body Movements: Absence of movements Muscle Tension: Relaxed Compliance with ventilator (intubated pts.): N/A Vocalization (extubated pts.): Talking in normal tone or no sound CPOT Total: 0 Pain Location: general Pain Descriptors / Indicators: Discomfort Pain Intervention(s): Monitored during session  Home Living                                          Prior Functioning/Environment              Frequency  Min 1X/week        Progress Toward Goals  OT Goals(current goals can now be found in the care plan section)  Progress towards OT goals: Progressing toward goals  Acute Rehab OT Goals OT Goal Formulation: Patient unable to participate in goal setting Time For Goal Achievement: 11/18/21 Potential to Achieve Goals: Fair ADL Goals Additional ADL Goal #1: Pt will follow 1 step command 50% of session (previous goal met) Additional ADL Goal #2: Pt will sustain attention to adl task 50% of session Additional ADL Goal #3: pt will tolerate EOB static sitting max (A) as precursor to adls.  Plan Discharge plan remains appropriate    Co-evaluation    PT/OT/SLP Co-Evaluation/Treatment: Yes Reason for Co-Treatment: Complexity of the patient's impairments (multi-system involvement)   OT goals addressed during session: ADL's and self-care;Proper use of Adaptive equipment and DME;Strengthening/ROM SLP goals addressed during session: Cognition;Communication    AM-PAC OT "6 Clicks" Daily Activity     Outcome Measure   Help from another person eating meals?: Total Help from another person taking care of personal grooming?: Total Help from another person toileting, which includes using toliet, bedpan, or urinal?: Total Help from another person bathing  (including washing, rinsing, drying)?: Total Help from another person to put on and taking off regular upper body clothing?: Total Help from another person to put on and taking off regular lower body clothing?: Total 6 Click Score: 6    End of Session Equipment Utilized During Treatment: Oxygen  OT Visit Diagnosis: Unsteadiness on feet (R26.81);Muscle weakness (generalized) (M62.81)   Activity Tolerance Patient tolerated treatment well   Patient Left in bed;with call bell/phone within reach;with bed alarm set;with nursing/sitter in room   Nurse Communication Mobility status;Precautions        Time: 2111-7356 OT Time Calculation (min): 24 min  Charges: OT General Charges $OT Visit: 1 Visit OT Treatments $Self Care/Home Management : 23-37 mins   Brynn, OTR/L  Acute Rehabilitation Services Pager: [phone] Office: [phone] .   Jeri Modena 11/08/2021, 3:45 PM

## 2021-11-09 ENCOUNTER — Inpatient Hospital Stay (HOSPITAL_COMMUNITY): Payer: Medicare Other

## 2021-11-09 DIAGNOSIS — G9340 Encephalopathy, unspecified: Secondary | ICD-10-CM | POA: Diagnosis not present

## 2021-11-09 DIAGNOSIS — I6300 Cerebral infarction due to thrombosis of unspecified precerebral artery: Secondary | ICD-10-CM | POA: Diagnosis not present

## 2021-11-09 DIAGNOSIS — J9601 Acute respiratory failure with hypoxia: Secondary | ICD-10-CM | POA: Diagnosis not present

## 2021-11-09 DIAGNOSIS — Z7189 Other specified counseling: Secondary | ICD-10-CM | POA: Diagnosis not present

## 2021-11-09 DIAGNOSIS — J9602 Acute respiratory failure with hypercapnia: Secondary | ICD-10-CM | POA: Diagnosis not present

## 2021-11-09 LAB — CBC
HCT: 27.9 % — ABNORMAL LOW (ref 39.0–52.0)
Hemoglobin: 8.8 g/dL — ABNORMAL LOW (ref 13.0–17.0)
MCH: 28.2 pg (ref 26.0–34.0)
MCHC: 31.5 g/dL (ref 30.0–36.0)
MCV: 89.4 fL (ref 80.0–100.0)
Platelets: 448 10*3/uL — ABNORMAL HIGH (ref 150–400)
RBC: 3.12 MIL/uL — ABNORMAL LOW (ref 4.22–5.81)
RDW: 16.9 % — ABNORMAL HIGH (ref 11.5–15.5)
WBC: 10.1 10*3/uL (ref 4.0–10.5)
nRBC: 0 % (ref 0.0–0.2)

## 2021-11-09 LAB — BLOOD CULTURE ID PANEL (REFLEXED) - BCID2

## 2021-11-09 LAB — MRSA NEXT GEN BY PCR, NASAL: MRSA by PCR Next Gen: NOT DETECTED

## 2021-11-09 LAB — RENAL FUNCTION PANEL
Albumin: 2 g/dL — ABNORMAL LOW (ref 3.5–5.0)
Anion gap: 9 (ref 5–15)
BUN: 30 mg/dL — ABNORMAL HIGH (ref 8–23)
CO2: 25 mmol/L (ref 22–32)
Calcium: 9.1 mg/dL (ref 8.9–10.3)
Chloride: 122 mmol/L — ABNORMAL HIGH (ref 98–111)
Creatinine, Ser: 1 mg/dL (ref 0.61–1.24)
GFR, Estimated: >60 mL/min (ref >60)
Glucose, Bld: 199 mg/dL — ABNORMAL HIGH (ref 70–99)
Phosphorus: 3.7 mg/dL (ref 2.5–4.6)
Potassium: 3.2 mmol/L — ABNORMAL LOW (ref 3.5–5.1)
Sodium: 156 mmol/L — ABNORMAL HIGH (ref 135–145)

## 2021-11-09 LAB — GLUCOSE, CAPILLARY
Glucose-Capillary: 117 mg/dL — ABNORMAL HIGH (ref 70–99)
Glucose-Capillary: 119 mg/dL — ABNORMAL HIGH (ref 70–99)
Glucose-Capillary: 120 mg/dL — ABNORMAL HIGH (ref 70–99)
Glucose-Capillary: 122 mg/dL — ABNORMAL HIGH (ref 70–99)
Glucose-Capillary: 132 mg/dL — ABNORMAL HIGH (ref 70–99)
Glucose-Capillary: 140 mg/dL — ABNORMAL HIGH (ref 70–99)

## 2021-11-09 LAB — PROCALCITONIN: Procalcitonin: 1.06 ng/mL

## 2021-11-09 LAB — OSMOLALITY, URINE: Osmolality, Ur: 550 mOsm/kg (ref 300–900)

## 2021-11-09 LAB — SODIUM, URINE, RANDOM: Sodium, Ur: 66 mmol/L

## 2021-11-09 LAB — OSMOLALITY: Osmolality: 344 mOsm/kg (ref 275–295)

## 2021-11-09 MED ORDER — FENTANYL CITRATE PF 50 MCG/ML IJ SOSY: 25.0000 ug | PREFILLED_SYRINGE | INTRAMUSCULAR | Status: DC | PRN

## 2021-11-09 MED ORDER — POTASSIUM CHLORIDE 20 MEQ PO PACK
20.0000 meq | PACK | ORAL | Status: AC
  Administered 2021-11-09 (×2): 20 meq
  Filled 2021-11-09: qty 1

## 2021-11-09 MED ORDER — LEVETIRACETAM 100 MG/ML PO SOLN
500.0000 mg | Freq: Two times a day (BID) | ORAL | Status: DC
  Administered 2021-11-09 – 2021-12-04 (×52): 500 mg
  Filled 2021-11-09 (×54): qty 5

## 2021-11-09 MED ORDER — LACOSAMIDE 200 MG PO TABS
200.0000 mg | ORAL_TABLET | Freq: Two times a day (BID) | ORAL | Status: DC
  Administered 2021-11-09 – 2021-12-04 (×52): 200 mg
  Filled 2021-11-09: qty 1
  Filled 2021-11-09 (×3): qty 4
  Filled 2021-11-09 (×16): qty 1
  Filled 2021-11-09: qty 4
  Filled 2021-11-09 (×11): qty 1
  Filled 2021-11-09: qty 4
  Filled 2021-11-09: qty 1
  Filled 2021-11-09: qty 4
  Filled 2021-11-09: qty 1
  Filled 2021-11-09: qty 4
  Filled 2021-11-09: qty 1
  Filled 2021-11-09: qty 4
  Filled 2021-11-09 (×5): qty 1
  Filled 2021-11-09: qty 4
  Filled 2021-11-09 (×4): qty 1
  Filled 2021-11-09 (×2): qty 4
  Filled 2021-11-09 (×2): qty 1

## 2021-11-09 MED ORDER — POLYETHYLENE GLYCOL 3350 17 G PO PACK: 17.0000 g | PACK | Freq: Every day | ORAL | Status: DC

## 2021-11-09 MED ORDER — FENTANYL CITRATE PF 50 MCG/ML IJ SOSY: 12.0000 ug | PREFILLED_SYRINGE | INTRAMUSCULAR | Status: DC | PRN

## 2021-11-09 MED ORDER — POTASSIUM CHLORIDE 10 MEQ/50ML IV SOLN
10.0000 meq | INTRAVENOUS | Status: AC
  Administered 2021-11-09 (×4): 10 meq via INTRAVENOUS
  Filled 2021-11-09 (×4): qty 50

## 2021-11-09 MED ORDER — DOCUSATE SODIUM 50 MG/5ML PO LIQD
100.0000 mg | Freq: Two times a day (BID) | ORAL | Status: DC
  Administered 2021-11-11: 100 mg
  Filled 2021-11-09 (×3): qty 10

## 2021-11-09 MED ORDER — FENTANYL CITRATE PF 50 MCG/ML IJ SOSY
25.0000 ug | PREFILLED_SYRINGE | INTRAMUSCULAR | Status: DC | PRN
  Administered 2021-11-09 – 2021-11-10 (×2): 50 ug via INTRAVENOUS
  Administered 2021-11-10: 100 ug via INTRAVENOUS
  Administered 2021-11-11: 50 ug via INTRAVENOUS
  Administered 2021-11-11: 100 ug via INTRAVENOUS
  Administered 2021-11-11: 50 ug via INTRAVENOUS
  Administered 2021-11-12 – 2021-11-13 (×7): 100 ug via INTRAVENOUS
  Administered 2021-11-13: 50 ug via INTRAVENOUS
  Administered 2021-11-13 – 2021-11-16 (×5): 100 ug via INTRAVENOUS
  Administered 2021-11-16: 50 ug via INTRAVENOUS
  Administered 2021-11-16 – 2021-11-17 (×2): 100 ug via INTRAVENOUS
  Filled 2021-11-09 (×7): qty 2
  Filled 2021-11-09 (×2): qty 1
  Filled 2021-11-09 (×5): qty 2
  Filled 2021-11-09: qty 1
  Filled 2021-11-09 (×2): qty 2
  Filled 2021-11-09: qty 1
  Filled 2021-11-09 (×3): qty 2
  Filled 2021-11-09: qty 1

## 2021-11-09 MED ORDER — PIPERACILLIN-TAZOBACTAM 3.375 G IVPB
3.3750 g | Freq: Three times a day (TID) | INTRAVENOUS | Status: DC
  Administered 2021-11-09 – 2021-11-11 (×5): 3.375 g via INTRAVENOUS
  Filled 2021-11-09 (×5): qty 50

## 2021-11-09 MED ORDER — FREE WATER
200.0000 mL | Status: DC
Start: 2021-11-09 — End: 2021-11-12
  Administered 2021-11-09 – 2021-11-12 (×38): 200 mL

## 2021-11-09 MED ORDER — FENTANYL CITRATE PF 50 MCG/ML IJ SOSY
PREFILLED_SYRINGE | INTRAMUSCULAR | Status: AC
  Administered 2021-11-09: 18:00:00 50 ug via INTRAVENOUS
  Filled 2021-11-09: qty 1

## 2021-11-09 MED ORDER — PIPERACILLIN-TAZOBACTAM 3.375 G IVPB 30 MIN
3.3750 g | Freq: Once | INTRAVENOUS | Status: AC
  Administered 2021-11-09: 18:00:00 3.375 g via INTRAVENOUS
  Filled 2021-11-09 (×2): qty 50

## 2021-11-09 NOTE — Progress Notes (Signed)
Consultation Note Date: 11/09/2021   Patient Name: Jeffrey Mullen  DOB: Mar 28, 1951  MRN: [id number]  Age / Sex: 70 y.o., male  PCP: [name], NP Referring Physician: Collene Gobble, MD  Reason for Consultation: GOC  HPI/Patient Profile: 70 y.o. male  with past medical history of alcohol abuse, anxiety, and hepatitis C with treatment in the past admitted on 10/23/2021 with acute encephalopathy and found to have tonic-clonic seizure requiring intubation and airway protection.    Tracheostomy was placed on 11/11.  TEE was attempted on 11/15 but unsuccessful.  Patient continues to have intermittent fevers without clear etiology.  He also continues to have copious oral and tracheal secretions but no clear purulence and follow-up respiratory cultures have been negative to date.   Palliative medicine team was consulted to help determine goals of care and decision maker in light of complex family dynamics.  Primary Decision Maker OTHER- daughter- however she defers to her motherLattie Mullen.   Discussion: Chart reviewed, patient examined. He opens his eyes to voice. He does not follow any commands, does not nod to any of my questions.  Met with Jeffrey Mullen and Jeffrey Mullen's friend [place]. Medical update given. Very long discussion regarding goals of care.  Jeffrey Mullen was tearful during discussion. Emotional support provided. She is mainly concerned about how these discussions will affect her daughter, Jeffrey Mullen. Jeffrey Mullen and Jeffrey Mullen have had a difficult relationship. They reunited about a year ago, however, their relationship has continued to be strained. Jeffrey Mullen is unable to make any decisions today.  I encouraged Jeffrey Mullen to consider that she is not being burdened with making a decision. Her only role is to speak for the Jeffrey Mullen that she once knew due to he can't speak for himself now.  We discussed options for ongoing care with likely  placement in LTAC facility, and that he will require lifelong 24 hour care.  Discussed code status with strong recommendations for DNR.       SUMMARY OF RECOMMENDATIONS -Continue current interventions -Will contact Jeffrey Mullen tomorrow for further discussion -If Jeffrey Mullen is unable to make decisions recommend at minimum a placement of 2 provider DNR order as any efforts of CPR in the event of cardiac arrest are not going to improve patient's functional status or improve quality of life     Code Status/Advance Care Planning: Full code   Prognosis:   Unable to determine  Discharge Planning: To Be Determined  Primary Diagnoses: Present on Admission:  Status epilepticus ([place])   Review of Systems  Unable to perform ROS: Mental status change   Physical Exam Vitals and nursing note reviewed.  Constitutional:      Appearance: He is ill-appearing.  Cardiovascular:     Comments: Diffuse anasarca Pulmonary:     Comments: Trach/vent, copious secretions Neurological:     Comments: Nonverbal, does not follow commands    Vital Signs: BP (!) 150/59   Pulse (!) 138   Temp (!) 101.2 F (38.4 C) (Oral) Comment: cooling blanket back on  Resp (!) 29  Ht _0  (1.778 m)   Wt 90 kg   SpO2 99%   BMI 28.47 kg/m  Pain Scale: CPOT   Pain Score:  (for fever)   SpO2: SpO2: 99 % O2 Device:SpO2: 99 % O2 Flow Rate: .O2 Flow Rate (L/min): 5 L/min  IO: Intake/output summary:  Intake/Output Summary (Last 24 hours) at 11/09/2021 1440 Last data filed at 11/09/2021 1412 Gross per 24 hour  Intake 3141.71 ml  Output 2500 ml  Net 641.71 ml    LBM: Last BM Date: 11/09/21 Baseline Weight: Weight: 79.2 kg Most recent weight: Weight: 90 kg     Palliative Assessment/Data: PPS: 10%       Thank you for this consult. Palliative medicine will continue to follow and assist as needed.   Time In: 1320 Time Out: 1445 Time Total: 85 minutes Prolonged services: yes Greater than 50%  of this time  was spent counseling and coordinating care related to the above assessment and plan.  Signed by: Mariana Kaufman, AGNP-C Palliative Medicine    Please contact Palliative Medicine Team phone at [phone] for questions and concerns.  For individual provider: See Shea Evans

## 2021-11-09 NOTE — Progress Notes (Signed)
Palliative-   Spoke with patient's ex significant other- she is willing to be decision maker for patient.  Plan to meet this afternoon at 1pm for continued [place] discussion.   Mariana Kaufman, AGNP-C Palliative Medicine Please call Palliative Medicine team phone with any questions [phone]. For individual providers please see AMION.  No charge

## 2021-11-09 NOTE — Progress Notes (Addendum)
NAME:  Jeffrey Mullen, MRN:  [id number], DOB:  30-Jun-1951, LOS: 86 ADMISSION DATE:  10/23/2021, CONSULTATION DATE:  10/31 REFERRING MD:  Dr. Dina Rich, CHIEF COMPLAINT:  AMS   History of Present Illness:  70 y/o M who presented to [facility] ER on 10/31 via EMS with reports of altered mental status.  Given  right sided gaze, altered and left sided weakness, code stoke called. The patient was taken emergently to CT for imaging which was negative for LVO.  While in the [place], he had a witnessed seizure.    PCCM called for ICU admission.   Chart review shows he was seen in the ER on 10/4 after passing out at the [employer].  Physical exam was negative at the time.  Labs negative.  No further imaging obtained.   Pertinent  Medical History  Alcohol Use - unclear quantity Anxiety / Depression  Congenital blindness of left eye, cataract on R Hepatitis C+, treated in 2016 Seizures   Significant Hospital Events: Including procedures, antibiotic start and stop dates in addition to other pertinent events   10/31 Admit with AMS, seizure, hypertensive emergency, AKI 11/1 MRI brain no acute intracranial process. No etiology is seen for the patient's seizure. 11/02 bedside LP attempted but unsuccessful  11/03  No seizures seen on EEG overnight  11/04 weaning sedation to assess for seizure reoccurrence  11/9 bronchoscopy with noted thick secretions in LLL 11/10 Left PICC placed 11/11 Trach,  Cor Track placed, New Embolic Strokes per MRI 42/59 Post Trach Day 1, some oozing from site, Echo results noted below 11/13 Low grade fever, tachy, LFT's remain elevated with ALT and Alk Phos continuing to rise.  S/p lasix  11/14 weaned PSV 10/5, no changes, foley removed 11/15 on ATC, TEE attempted- unable to pass probe, briefly on Neo, cortrak removed fort TEE, tmax 102.3 11/17 afebrile, NG on cultures, continues to have copious secretions   Last Blood Cx 11/8 Neg Last sputum Cx ( BAL) >> 11/9 >> No  growth Tracheal Aspirate 11/8>> Normal [place] Trach asp 11/13 > ngtd 11/13 BC x1 >> ngtd 11/16 trach asp >> GPC and GNR 11/16 Bcx 2 >> ngtd  MRI Brain 11/11 Interval development of multiple scattered acute ischemic infarcts involving the bilateral cerebral hemispheres and left pons, largest measuring 4.9 cm at the right occipital lobe. A central thromboembolic etiology is suspected given the various vascular distributions involved.  Echo 11/11 ECHO reviewed  - Left ventricle: The cavity size was normal. There was moderate    concentric hypertrophy. Systolic function was normal. The    estimated ejection fraction was in the range of 60% to 65%. Wall    motion was normal; there were no regional wall motion    abnormalities. Features are consistent with a pseudonormal left    ventricular filling pattern, with concomitant abnormal relaxation    and increased filling pressure (grade 2 diastolic dysfunction).  - Aortic valve: There was no significant regurgitation.  - Mitral valve: There was no significant regurgitation.  - Left atrium: The atrium was mildly dilated.  - Right ventricle: Systolic function was normal.  - Atrial septum: No defect or patent foramen ovale was identified.  - Tricuspid valve: There was no significant regurgitation.  - Pulmonic valve: There was no significant regurgitation.  - Inferior vena cava: The vessel was normal in size. The    respirophasic diameter changes were in the normal range (>= 50%),    consistent with normal central venous pressure.  RUQ limited US 11/13 > Heterogeneous increased hepatic parenchymal echogenicity typical of steatosis or other intrinsic hepatocellular disease. Nodular hepatic contours suggesting cirrhosis.  Unremarkable sonographic appearance of the gallbladder and biliary tree.  Interim History / Subjective:  Tolerated trach collar overnight.  Continues to have copious secretions.  Objective   Blood pressure 137/62, pulse  (!) 111, temperature 98.6 F (37 C), temperature source Axillary, resp. rate (!) 31, height _0  (1.778 m), weight 90 kg, SpO2 94 %.    FiO2 (%):  [28 %-35 %] 28 %   Intake/Output Summary (Last 24 hours) at 11/09/2021 1011 Last data filed at 11/09/2021 0800 Gross per 24 hour  Intake 2967.59 ml  Output 3050 ml  Net -82.41 ml    Examination: General: Chronically ill-appearing male in no distress Cardiac: Tachycardic rate, regular rhythm, no lower extremity edema Pulm: Copious clear weight secretions from trach.  Diffuse rhonchi bilaterally GI: Abdomen soft nondistended  Resolved Hospital Problem list   AKI due to contrast-induced ATN, resolved Nontraumatic rhabdomyolysis, resolved Non-anion Gap Metabolic Acidosis, resolved Assessment & Plan:   Status epilepticus, improved -No seizure seen on EEG since 11/3 -meningitis and encephalitis was ruled out with negative LP 11/2 -MRI 11/1 with no acute process  Alcohol abuse with prior SDH New Stroke 11/11 per MRI, suspected to be embolic  etiology of new embolic strokes remains unclear.  Has been febrile but cultures negative to date.  TEE attempted 11/15 but was technically difficult and scope could not be passed.  Anticoagulation to be discussed with neurology.   Plan -Neurology Following, appreciate input - AED per neurology, remains on lacosamide and keppra - Seizure precautions  - continues on ASA, plavix held at this time - long term BP goal normotensive - continue MVI, thiamine, folate  Acute Hypoxic Respiratory Failure  Trach 11/11  S/p treatment for H. Flu pneumonia.  Antibiotics: azithro 11/4-11/7, rocephin 11/4-11/7 and 11/11-11/12, linezolid 11/4-11/5, unasyn 11/8-11/11 Continues to have copious secretions and intermittent fevers.  No growth on tracheal aspirate culture.  No growth to date on blood cultures. ?Neurogenic fever.  Leukocytosis resolved.   Stable on trach collar since 11/15. Plan - continue VAP  prevention protocol/ PPI - ongoing trach care, sutures will need to be removed 7-10 days after insertion - follow blood and tracheal cultures. Would defer antibiotics unless indicated by culture data  Hypernatremia Free water increased yesterday.  Sodium is increased to 156. FWD 6.2L. ?DI Check urine osmol and sodium, serum osmol Continue FW Strict I/O  Hypokalemia Daily labs, replete as needed  Elevated LFT's  Total Bili normal Hx of HCV (treated) - RUQ limited US 11/13 > Heterogeneous increased hepatic parenchymal echogenicity typical of steatosis or other intrinsic hepatocellular disease. Nodular hepatic contours suggesting cirrhosis.  Unremarkable sonographic appearance of the gallbladder and biliary tree. - ammonia 17 on 11/13 Plan - LFTs remain elevated but mostly stable.  He has hx of HCV, reportedly treated.  [place] as above suggestive of cirrhosis.  Can check HCV RNA to see if currently infected.   Prediabetes Glucoses at goal.  insulin/ SSI stopped 11/15 due to hypoglycemia, NPO, and loss of enteral access  Plan - continue CBG q4, add SSI back if > 180  Vasopressor Extravasation Skin Injury - left hand/arm S/p phentolamine and nitroglycerin application Left Arm Skin Tear>> skin is sluffing with pink granulating tissue beneath Unstageable sacral ulcer (POA)>> Progression despite topical barrier treatment  Plan - frequent turns - Continue dressing changes to L forearm and  sacrum - new pressure injury noted to buttocks post since flexi seal removal  - WOC   Hypertension. Blood pressure at goal.  - continue amlodipine, hydralazine, hctz - permissive longterm goal   Normocytic anemia - Hgb remains stable.  No evidence of bleeding - trend CBC, transfuse for > 7  Goals of care Patient's daughter does not want to be decision-maker for him, she made her mother, [name], his ex-significant other to be the primary decision maker. Palliative consulted. Family meeting today.    Best Practice (right click and "Reselect all SmartList Selections" daily)   Diet/type: tubefeeds- restart after cortrak insertion DVT prophylaxis: prophylactic heparin  GI prophylaxis: PPI Lines: Central line- LUE PICC (inserted 11/10) Foley:  condom cath Code Status:  full code Pending update 11/15- attempted to call [name] at [phone] for update, unable to reach at this time.  Will try again today.   Mitzi Hansen, MD Internal Medicine Resident PGY-3 [facility] 11/09/2021 2:05 PM        Attending Note:  I have examined patient, reviewed labs, studies and notes.   70 year old man with a history of seizures, alcohol abuse, anxiety, treated hep C.  He was admitted with tonic-clonic seizures, found to have scattered bilateral CVA on MRI brain 11/11 consistent with possible embolic process.  Required intubation and mechanical ventilation.  Course complicated by H influenzae right lower lobe pneumonia (treated).  He underwent tracheostomy on 11/11.  He is off antibiotics, has continued to have intermittent fever.  No fever over the last 24 hours.  TEE was attempted 11/15 but unsuccessful.  He is now on ATC and as tolerated for over 48 hours.  Lots of thick clear secretions.   Vitals:   11/09/21 1200 11/09/21 1232 11/09/21 1300 11/09/21 1400  BP: (!) 161/64 (!) 161/64 137/61 (!) 150/59  Pulse: (!) 140 (!) 128 (!) 127 (!) 138  Resp: (!) 31 (!) 26 (!) 26 (!) 29  Temp: (!) 101.2 F (38.4 C)     TempSrc: Oral     SpO2: 95%  100% 99%  Weight:      Height:      Chronically ill-appearing man, laying in bed on trach collar with some clear white secretions.  [name] site looks good.  Left opaque, right pupil reactive.  He will open eyes to voice, stimulus and intermittently tracks.  He was able to phonate and answer simple questions on 11/16, not currently.  No spontaneous movement noted.  Foot drop boots in place.  Heart regular and tachycardic 105.  Lungs are  coarse without wheezing or crackles.  Abdomen nondistended with positive bowel sounds.  Bilateral upper extremity edema.  No edema lower extremities.  Status epilepticus and acute severe encephalopathy due to acute CVA's.  Unclear whether these were embolic.  No clear evidence to support endocarditis although TEE could not be performed.  No clear source of embolic process.  Discussed with neurology.  For now we will defer anticoagulation.  Plan to continue his Keppra and lacosamide.  Acute hypoxemic respiratory failure due to the above as well as H. influenzae pneumonia, now on ATC.  He has a high secretion burden and requires frequent suctioning.  Otherwise he could probably move out of the ICU.  HTN, on amlodipine, hydralazine, HCTZ  Normocytic anemia, following CBC    Independent critical care time is 31 minutes.   Baltazar Apo, MD, PhD 11/09/2021, 2:43 PM [facility] [phone] or if no answer [phone]

## 2021-11-09 NOTE — Progress Notes (Signed)
[name] Progress Note Patient Name: Jeffrey Mullen DOB: 24-Jul-1951 MRN: [id number]   Date of Service  11/09/2021  HPI/Events of Note  Serum Sodium up to 156 this AM.  eICU Interventions  Free water order changed to 200 ml via feeding tube Q 2 hours.        Kerry Kass Adhya Cocco 11/09/2021, 6:23 AM

## 2021-11-09 NOTE — Progress Notes (Signed)
Pharmacy Antibiotic Note  Jeffrey Mullen is a 70 y.o. male admitted on 10/23/2021 with pneumonia.  Pharmacy has been consulted for Zosyn dosing. SCr stable at 1.0.  Plan: Zosyn 3.375g IV (84min infusion) x1; then 3.375g IV q8h (4h infusion) Monitor clinical progress, c/s, renal function F/u de-escalation plan/LOT   Height: 5\' 10"  (177.8 cm) Weight: 90 kg (198 lb 6.6 oz) IBW/kg (Calculated) : 73  Temp (24hrs), Avg:100.9 F (38.3 C), Min:98.6 F (37 C), Max:103.7 F (39.8 C)  Recent Labs  Lab 11/04/21 0234 11/04/21 1241 11/05/21 0600 11/05/21 1653 11/06/21 0615 11/07/21 0540 11/08/21 0425 11/09/21 0458  WBC 16.2*  --  13.3*  --  16.1*  --  11.0* 10.1  CREATININE 1.37*   < > 1.13  1.10 1.08 0.99 0.94 1.04 1.00   < > = values in this interval not displayed.    Estimated Creatinine Clearance: 80.9 mL/min (by C-G formula based on SCr of 1 mg/dL).    No Active Allergies    Arturo Morton, PharmD, BCPS Please check AMION for all [place] contact numbers Clinical Pharmacist 11/09/2021 4:55 PM

## 2021-11-09 NOTE — Progress Notes (Addendum)
After tylenol patient's temp still 103.2. Cooling blanket made cooler and ice packs placed on patient at this time. Will recheck about 1800. CCM notified.   1745 update: temp 101.1. Will continue current therapies.  Montez Hageman RN

## 2021-11-09 NOTE — Progress Notes (Signed)
PHARMACY - PHYSICIAN COMMUNICATION CRITICAL VALUE ALERT - BLOOD CULTURE IDENTIFICATION (BCID)  [name] is an 70 y.o. male who presented to [facility] on 10/23/2021 with a chief complaint of altered mental status, seizures  Assessment:  1 out of 2 blood cultures positive for Staph epi with MEC A resistance. Likely a contaminant  Name of physician (or Provider) Contacted: Dr. Darrick Meigs  Current antibiotics: None  Changes to prescribed antibiotics recommended:   No changes needed  Results for orders placed or performed during the hospital encounter of 10/23/21  Blood Culture ID Panel (Reflexed) (Collected: 11/08/2021  9:42 AM)  Result Value Ref Range   Enterococcus faecalis NOT DETECTED NOT DETECTED   Enterococcus Faecium NOT DETECTED NOT DETECTED   Listeria monocytogenes NOT DETECTED NOT DETECTED   Staphylococcus species DETECTED (A) NOT DETECTED   Staphylococcus aureus (BCID) NOT DETECTED NOT DETECTED   Staphylococcus epidermidis DETECTED (A) NOT DETECTED   Staphylococcus lugdunensis NOT DETECTED NOT DETECTED   Streptococcus species NOT DETECTED NOT DETECTED   Streptococcus agalactiae NOT DETECTED NOT DETECTED   Streptococcus pneumoniae NOT DETECTED NOT DETECTED   Streptococcus pyogenes NOT DETECTED NOT DETECTED   A.calcoaceticus-baumannii NOT DETECTED NOT DETECTED   Bacteroides fragilis NOT DETECTED NOT DETECTED   Enterobacterales NOT DETECTED NOT DETECTED   Enterobacter cloacae complex NOT DETECTED NOT DETECTED   Escherichia coli NOT DETECTED NOT DETECTED   Klebsiella aerogenes NOT DETECTED NOT DETECTED   Klebsiella oxytoca NOT DETECTED NOT DETECTED   Klebsiella pneumoniae NOT DETECTED NOT DETECTED   Proteus species NOT DETECTED NOT DETECTED   Salmonella species NOT DETECTED NOT DETECTED   Serratia marcescens NOT DETECTED NOT DETECTED   Haemophilus influenzae NOT DETECTED NOT DETECTED   Neisseria meningitidis NOT DETECTED NOT DETECTED   Pseudomonas aeruginosa NOT  DETECTED NOT DETECTED   Stenotrophomonas maltophilia NOT DETECTED NOT DETECTED   Candida albicans NOT DETECTED NOT DETECTED   Candida auris NOT DETECTED NOT DETECTED   Candida glabrata NOT DETECTED NOT DETECTED   Candida krusei NOT DETECTED NOT DETECTED   Candida parapsilosis NOT DETECTED NOT DETECTED   Candida tropicalis NOT DETECTED NOT DETECTED   Cryptococcus neoformans/gattii NOT DETECTED NOT DETECTED   Methicillin resistance mecA/C DETECTED (A) NOT [place] 11/09/2021  2:18 PM

## 2021-11-09 NOTE — Progress Notes (Signed)
RT placed patient on previous full support vent settings due to sustained HR of 140's-150's, increased WOB, increased RR in the 40's and copious amounts of secretions. Patient seems to be tolerating well at this time. NP and RN aware. RT will continue to monitor.

## 2021-11-09 NOTE — Progress Notes (Signed)
Speech Language Pathology Treatment: Nada Boozer Speaking valve;Cognitive-Linquistic  Patient Details Name: Jeffrey Mullen MRN: [id number] DOB: 1951-11-12 Today's Date: 11/09/2021 Time: 9622-2979 SLP Time Calculation (min) (ACUTE ONLY): 14 min  Assessment / Plan / Recommendation Clinical Impression  Pt was seen for therapy targeting PMV tolerance, speech intelligibility strategies, and cognition. Pt awake and makes eye contact readily upon entry with alertness improved from previous session. Pt WOB increased from previous session however sats still appropriate for PMV trials. SLP donned PMV noting no immediate change in sats (HR 122, SpO2 95, RR 30). PMV donned for 3 minute intervals, doffed when WOB increased and RR rose to 40, noting instances of air trapping x2. Throughout session, pt followed simple one-step commands x2 and answered basic environmental questions with 100% accuracy when provided with two choices and prompt repetition. Pt speech intelligibility still 0-24% at the word level with significantly impacted articulation, respiration, and phonation. Pt benefited from cues to increase volume and repeat utterances. Pt more readily responding to questions gesturally (primarily head nodding) independently. SLP will continue to follow pt for therapy targeting PMV tolerance, speech intelligibility strategies, and further diagnostic therapy for cognition.    HPI HPI: 70 y/o male presented to ED on 10/23/21 after bystanders witnessed patient collapse with associated seizure. Another seizure witnessed in ED. CT head negative. CT C-spine negative. LTM EEG on 11/1 showed 2 seizures from R anterior temporal region. Last seizure noted on EEG on 11/3. Intubated 10/31-11/11. Trach placed 11/11. 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  Recommendations may be updated based on patient status, additional functional criteria and insurance authorization.    Recommendations         Patient may use Passy-Muir Speech Valve: with SLP only PMSV Supervision: Full MD: Please consider changing trach tube to : Cuffless         General recommendations: Rehab consult Oral Care Recommendations: Oral care QID Follow Up Recommendations: Follow physician's recommendations for discharge plan and follow up therapies Assistance recommended at discharge: Other (comment) (tbd) SLP Visit Diagnosis: Cognitive communication deficit (R41.841);Dysarthria and anarthria (R47.1) Plan: Continue with current plan of care       GO              [name], SLP-Student   [name]  11/09/2021, 10:06 AM

## 2021-11-09 NOTE — Progress Notes (Signed)
[facility] ADULT ICU REPLACEMENT PROTOCOL   The patient does apply for the [facility] Adult ICU Electrolyte Replacment Protocol based on the criteria listed below:   1.Exclusion criteria: TCTS patients, ECMO patients, and Dialysis patients 2. Is GFR >/= 30 ml/min? Yes.    Patient's GFR today is >60 3. Is SCr </= 2? Yes.   Patient's SCr is 1.00 mg/dL 4. Did SCr increase >/= 0.5 in 24 hours? No 5.Pt's weight >40kg  Yes.   6. Abnormal electrolyte(s): K+ 3.2  7. Electrolytes replaced per protocol 8.  Call MD STAT for K+ </= 2.5, Phos </= 1, or Mag </= 1 Physician:  n/a   Jeffrey Mullen 11/09/2021 5:57 AM

## 2021-11-10 DIAGNOSIS — G9340 Encephalopathy, unspecified: Secondary | ICD-10-CM | POA: Diagnosis not present

## 2021-11-10 DIAGNOSIS — N179 Acute kidney failure, unspecified: Secondary | ICD-10-CM | POA: Diagnosis not present

## 2021-11-10 DIAGNOSIS — J9602 Acute respiratory failure with hypercapnia: Secondary | ICD-10-CM | POA: Diagnosis not present

## 2021-11-10 DIAGNOSIS — I639 Cerebral infarction, unspecified: Secondary | ICD-10-CM | POA: Diagnosis not present

## 2021-11-10 DIAGNOSIS — Z515 Encounter for palliative care: Secondary | ICD-10-CM | POA: Diagnosis not present

## 2021-11-10 DIAGNOSIS — R569 Unspecified convulsions: Secondary | ICD-10-CM | POA: Diagnosis not present

## 2021-11-10 LAB — CULTURE, BLOOD (SINGLE)
Culture: NO GROWTH
Special Requests: ADEQUATE

## 2021-11-10 LAB — CBC WITH DIFFERENTIAL/PLATELET
Abs Immature Granulocytes: 0.09 10*3/uL — ABNORMAL HIGH (ref 0.00–0.07)
Basophils Absolute: 0.1 10*3/uL (ref 0.0–0.1)
Basophils Relative: 1 %
Eosinophils Absolute: 0.1 10*3/uL (ref 0.0–0.5)
Eosinophils Relative: 1 %
HCT: 26.8 % — ABNORMAL LOW (ref 39.0–52.0)
Hemoglobin: 8.5 g/dL — ABNORMAL LOW (ref 13.0–17.0)
Immature Granulocytes: 1 %
Lymphocytes Relative: 16 %
Lymphs Abs: 2.2 10*3/uL (ref 0.7–4.0)
MCH: 27.9 pg (ref 26.0–34.0)
MCHC: 31.7 g/dL (ref 30.0–36.0)
MCV: 87.9 fL (ref 80.0–100.0)
Monocytes Absolute: 1.2 10*3/uL — ABNORMAL HIGH (ref 0.1–1.0)
Monocytes Relative: 8 %
Neutro Abs: 10.5 10*3/uL — ABNORMAL HIGH (ref 1.7–7.7)
Neutrophils Relative %: 73 %
Platelets: 440 10*3/uL — ABNORMAL HIGH (ref 150–400)
RBC: 3.05 MIL/uL — ABNORMAL LOW (ref 4.22–5.81)
RDW: 17.1 % — ABNORMAL HIGH (ref 11.5–15.5)
WBC: 14.1 10*3/uL — ABNORMAL HIGH (ref 4.0–10.5)
nRBC: 0 % (ref 0.0–0.2)

## 2021-11-10 LAB — COMPREHENSIVE METABOLIC PANEL
ALT: 206 U/L — ABNORMAL HIGH (ref 0–44)
AST: 219 U/L — ABNORMAL HIGH (ref 15–41)
Albumin: 2 g/dL — ABNORMAL LOW (ref 3.5–5.0)
Alkaline Phosphatase: 467 U/L — ABNORMAL HIGH (ref 38–126)
Anion gap: 8 (ref 5–15)
BUN: 31 mg/dL — ABNORMAL HIGH (ref 8–23)
CO2: 25 mmol/L (ref 22–32)
Calcium: 8.7 mg/dL — ABNORMAL LOW (ref 8.9–10.3)
Chloride: 120 mmol/L — ABNORMAL HIGH (ref 98–111)
Creatinine, Ser: 1.05 mg/dL (ref 0.61–1.24)
GFR, Estimated: >60 mL/min (ref >60)
Glucose, Bld: 159 mg/dL — ABNORMAL HIGH (ref 70–99)
Potassium: 3.3 mmol/L — ABNORMAL LOW (ref 3.5–5.1)
Sodium: 153 mmol/L — ABNORMAL HIGH (ref 135–145)
Total Bilirubin: 0.6 mg/dL (ref 0.3–1.2)
Total Protein: 7.4 g/dL (ref 6.5–8.1)

## 2021-11-10 LAB — GLUCOSE, CAPILLARY
Glucose-Capillary: 134 mg/dL — ABNORMAL HIGH (ref 70–99)
Glucose-Capillary: 158 mg/dL — ABNORMAL HIGH (ref 70–99)
Glucose-Capillary: 149 mg/dL — ABNORMAL HIGH (ref 70–99)
Glucose-Capillary: 162 mg/dL — ABNORMAL HIGH (ref 70–99)
Glucose-Capillary: 152 mg/dL — ABNORMAL HIGH (ref 70–99)
Glucose-Capillary: 161 mg/dL — ABNORMAL HIGH (ref 70–99)

## 2021-11-10 LAB — HCV RNA QUANT: HCV Quantitative: NOT DETECTED IU/mL (ref >50)

## 2021-11-10 LAB — BASIC METABOLIC PANEL
Anion gap: 5 (ref 5–15)
BUN: 37 mg/dL — ABNORMAL HIGH (ref 8–23)
CO2: 25 mmol/L (ref 22–32)
Calcium: 8.6 mg/dL — ABNORMAL LOW (ref 8.9–10.3)
Chloride: 122 mmol/L — ABNORMAL HIGH (ref 98–111)
Creatinine, Ser: 1.07 mg/dL (ref 0.61–1.24)
GFR, Estimated: >60 mL/min (ref >60)
Glucose, Bld: 215 mg/dL — ABNORMAL HIGH (ref 70–99)
Potassium: 3.7 mmol/L (ref 3.5–5.1)
Sodium: 152 mmol/L — ABNORMAL HIGH (ref 135–145)

## 2021-11-10 LAB — PHOSPHORUS: Phosphorus: 2.6 mg/dL (ref 2.5–4.6)

## 2021-11-10 MED ORDER — DEXTROSE 5 % IV SOLN: INTRAVENOUS | Status: DC

## 2021-11-10 MED ORDER — POTASSIUM CHLORIDE 20 MEQ PO PACK
60.0000 meq | PACK | Freq: Once | ORAL | Status: DC
  Filled 2021-11-10: qty 3

## 2021-11-10 MED ORDER — POTASSIUM CHLORIDE 20 MEQ PO PACK
60.0000 meq | PACK | Freq: Once | ORAL | Status: AC
  Administered 2021-11-10: 60 meq

## 2021-11-10 MED ORDER — POTASSIUM & SODIUM PHOSPHATES 280-160-250 MG PO PACK
2.0000 | PACK | Freq: Once | ORAL | Status: AC
  Administered 2021-11-10: 2
  Filled 2021-11-10: qty 2

## 2021-11-10 NOTE — Progress Notes (Signed)
Consultation Note Date: 11/10/2021   Patient Name: Jeffrey Mullen  DOB: February 08, 1951  MRN: [id number]  Age / Sex: 70 y.o., male  PCP: [name], NP Referring Physician: Collene Gobble, MD  Reason for Consultation: GOC  HPI/Patient Profile: 70 y.o. male  with past medical history of alcohol abuse, anxiety, and hepatitis C with treatment in the past admitted on 10/23/2021 with acute encephalopathy and found to have tonic-clonic seizure requiring intubation and airway protection. MRI showed multiple scattered acute ischemic infarcts involving bilateral cerebral hemispheres, and L pons.    Tracheostomy was placed on 11/11.  TEE was attempted on 11/15 but unsuccessful.  Patient continues to have intermittent fevers without clear etiology.  He also continues to have copious oral and tracheal secretions but no clear purulence and follow-up respiratory cultures have been negative to date.   Palliative medicine team was consulted to help determine goals of care and decision maker in light of complex family dynamics.  Primary Decision Maker OTHER- daughter- however she defers to her motherLattie Mullen.   Discussion: Jeffrey Mullen today does not open his eyes to my voice. He is continuing to have high fevers with left shift- unknown source.  I attempted to call Jeffrey Mullen- there was no answer.    SUMMARY OF RECOMMENDATIONS -Continue current interventions -Recommend placement of 2 provider DNR order as any efforts of CPR in the event of cardiac arrest are not going to improve patient's functional status or improve quality of life  -My medical recommendation is for DNR status due to if he were to decline to the point of cardiac arrest performing CPR would be traumatic to this already very debilitated patient who is not showing evidence of improvement in neurological or physical functioning.     Regarding decision making for  patient: Jeffrey Mullen has agreed to be decision maker however, today she is unreachable, if she continues to be unreachable or unavailable for decision making or declines to make decisions in the future- then per [place] General Statutes the attending physician along with confirmation of another physician of the patient's condition and necessity (or lack of) for treatment is acceptable  See: [place] 90. Medicine and Allied Occupations  90-21.13. Informed consent to health care treatment or procedure. In brief summary, this statute outlines that the following persons, in order indicated, are authorized to consent to medical treatment on behalf of the patient who does not demonstrate capacity to do so: Legally assigned guardian appointed by the court > healthcare agent appointed by legal healthcare power of attorney > healthcare agent appointed by the patient > legal spouse > majority of the patient's reasonably available parents and children who are at least 37 years of age > individual who has an established relationship with the patient, who is acting in good faith on behalf of the patient, and who can reliably convey the patient's wishes > attending physician with confirmation by another physician of the patient's condition and the necessity for treatment (unless in urgent/emergent situation)  Code Status/Advance Care Planning: Full code   Prognosis:   Unable to determine  Discharge Planning: To Be Determined  Primary Diagnoses: Present on Admission:  Status epilepticus ([place])   Review of Systems  Unable to perform ROS: Mental status change   Physical Exam Vitals and nursing note reviewed.  Constitutional:      Appearance: He is ill-appearing.  Cardiovascular:     Comments: Diffuse anasarca Pulmonary:     Comments: Trach/vent, copious secretions Neurological:     Comments: Nonverbal, does not follow commands    Vital Signs: BP (!) 154/64   Pulse (!) 128    Temp (S) (!) 102.3 F (39.1 C) (Oral) Comment: restarted cooling blanket  Resp (!) 36   Ht 5\' 10"  (1.778 m)   Wt 81.6 kg   SpO2 98%   BMI 25.81 kg/m  Pain Scale: CPOT   Pain Score:  (for fever)   SpO2: SpO2: 98 % O2 Device:SpO2: 98 % O2 Flow Rate: .O2 Flow Rate (L/min): 5 L/min  IO: Intake/output summary:  Intake/Output Summary (Last 24 hours) at 11/10/2021 1152 Last data filed at 11/10/2021 1000 Gross per 24 hour  Intake 1473.75 ml  Output 2075 ml  Net -601.25 ml     LBM: Last BM Date: 11/09/21 Baseline Weight: Weight: 79.2 kg Most recent weight: Weight: 81.6 kg     Palliative Assessment/Data: PPS: 10%    Thank you for this consult. Palliative medicine will continue to follow and assist as needed.  Total time: 30 minutes Greater than 50%  of this time was spent counseling and coordinating care related to the above assessment and plan.  Signed by: Mariana Kaufman, AGNP-C Palliative Medicine    Please contact Palliative Medicine Team phone at [phone] for questions and concerns.  For individual provider: See Shea Evans

## 2021-11-10 NOTE — TOC Initial Note (Signed)
Transition of Care [facility]) - Initial/Assessment Note    Patient Details  Name: Jeffrey Mullen MRN: [id number] Date of Birth: [birth year]  [facility] CM/SW Contact:    Ella Bodo, RN Phone Number: 11/10/2021, 4:14 PM  Clinical Narrative:                 Discussed possible referral to [facility] hospital with attending. Currently, patient with fevers and high volume secretions, MD feels patient needs to remain in ICU for now. CCM to consult TOC for LTAC referral when patient more stable. Noted Palliative Medicine Team's intervention to assist family with goals of care.  Will follow progress.   Expected Discharge Plan: Long Term Acute Care (LTAC) Barriers to Discharge: Continued Medical Work up          Expected Discharge Plan and Services Expected Discharge Plan: [place] (LTAC)   Discharge Planning Services: CM Consult   Living arrangements for the past 2 months: Apartment                                      Prior Living Arrangements/Services Living arrangements for the past 2 months: Apartment Lives with:: Self                Criminal Activity/Legal Involvement Pertinent to Current Situation/Hospitalization: No - Comment as needed  Activities of Daily Living Home Assistive Devices/Equipment: None ADL Screening (condition at time of admission) Patient's cognitive ability adequate to safely complete daily activities?: No Is the patient deaf or have difficulty hearing?: No Does the patient have difficulty seeing, even when wearing glasses/contacts?: Yes Does the patient have difficulty concentrating, remembering, or making decisions?: Yes Patient able to express need for assistance with ADLs?: No Does the patient have difficulty dressing or bathing?: Yes Independently performs ADLs?: No Communication: Dependent Is this a change from baseline?: Change from baseline, expected to last >3 days Dressing (OT): Dependent Is this a  change from baseline?: Change from baseline, expected to last >3 days Grooming: Dependent Is this a change from baseline?: Change from baseline, expected to last >3 days Feeding: Dependent Is this a change from baseline?: Change from baseline, expected to last >3 days Bathing: Dependent Is this a change from baseline?: Change from baseline, expected to last >3 days Toileting: Dependent Is this a change from baseline?: Change from baseline, expected to last >3days In/Out Bed: Dependent Is this a change from baseline?: Change from baseline, expected to last >3 days Walks in Home: Dependent Is this a change from baseline?: Change from baseline, expected to last >3 days Does the patient have difficulty walking or climbing stairs?: Yes Weakness of Legs: Both Weakness of Arms/Hands: Both                 Emotional Assessment Appearance:: Appears stated age Attitude/Demeanor/Rapport: Intubated (Following Commands or Not Following Commands) Affect (typically observed): Unable to Assess        Admission diagnosis:  Status epilepticus ([place]) [G40.901] Seizure [facility]) [R56.9] Patient Active Problem List   Diagnosis Date Noted   Pressure injury of skin 11/08/2021   AKI (acute kidney injury) ([place])    Seizure ([place])    Cryptogenic stroke ([place])    Acute respiratory failure with hypercapnia ([place])    Acute encephalopathy    Status epilepticus ([place]) 10/23/2021   Cataract Right eye 12/12/2018   Glaucoma, congenital, blind Left eye  12/12/2018   Lives in homeless shelter 12/12/2018   Shoulder pain, right 12/12/2018   Tobacco abuse    ETOH abuse    Benign essential HTN    Hepatic cirrhosis ([place]) 02/10/2015   Chronic hepatitis C without hepatic coma ([place]) 01/05/2015   PCP:  [name], NP Pharmacy:   [place], [place] [address] [place] [address] Phone: [phone] Fax: [phone]     Social Determinants of  Health (SDOH) Interventions    Readmission Risk Interventions No flowsheet data found.  Reinaldo Raddle, RN, BSN  Trauma/Neuro ICU Case Manager [phone]

## 2021-11-10 NOTE — Progress Notes (Signed)
NAME:  HILARY PUNDT, MRN:  [id number], DOB:  05/21/1951, LOS: 39 ADMISSION DATE:  10/23/2021, CONSULTATION DATE:  10/31 REFERRING MD:  Dr. Dina Rich, CHIEF COMPLAINT:  AMS   History of Present Illness:  70 y/o M who presented to [facility] ER on 10/31 via EMS with reports of altered mental status.  Given  right sided gaze, altered and left sided weakness, code stoke called. The patient was taken emergently to CT for imaging which was negative for LVO.  While in the [place], he had a witnessed seizure.    PCCM called for ICU admission.   Chart review shows he was seen in the ER on 10/4 after passing out at the [employer].  Physical exam was negative at the time.  Labs negative.  No further imaging obtained.   Pertinent  Medical History  Alcohol Use - unclear quantity Anxiety / Depression  Congenital blindness of left eye, cataract on R Hepatitis C+, treated in 2016 Seizures   Significant Hospital Events: Including procedures, antibiotic start and stop dates in addition to other pertinent events   10/31 Admit with AMS, seizure, hypertensive emergency, AKI 11/1 MRI brain no acute intracranial process. No etiology is seen for the patient's seizure. 11/02 bedside LP attempted but unsuccessful  11/03  No seizures seen on EEG overnight  11/04 weaning sedation to assess for seizure reoccurrence  11/9 bronchoscopy with noted thick secretions in LLL 11/10 Left PICC placed 11/11 Trach,  Cor Track placed, New Embolic Strokes per MRI 95/28 Post Trach Day 1, some oozing from site, Echo results noted below 11/13 Low grade fever, tachy, LFT's remain elevated with ALT and Alk Phos continuing to rise.  S/p lasix  11/14 weaned PSV 10/5, no changes, foley removed 11/15 on ATC, TEE attempted- unable to pass probe, briefly on Neo, cortrak removed fort TEE, tmax 102.3 11/17 afebrile, NG on cultures, continues to have copious secretions   Last Blood Cx 11/8 Neg Last sputum Cx ( BAL) >> 11/9 >> No  growth Tracheal Aspirate 11/8>> Normal [place] Trach asp 11/13 > ngtd 11/13 BC x1 >> ngtd 11/16 trach asp >> GPC and GNR 11/16 Bcx 2 >> ngtd  MRI Brain 11/11 Interval development of multiple scattered acute ischemic infarcts involving the bilateral cerebral hemispheres and left pons, largest measuring 4.9 cm at the right occipital lobe. A central thromboembolic etiology is suspected given the various vascular distributions involved.  Echo 11/11 ECHO reviewed  - Left ventricle: The cavity size was normal. There was moderate    concentric hypertrophy. Systolic function was normal. The    estimated ejection fraction was in the range of 60% to 65%. Wall    motion was normal; there were no regional wall motion    abnormalities. Features are consistent with a pseudonormal left    ventricular filling pattern, with concomitant abnormal relaxation    and increased filling pressure (grade 2 diastolic dysfunction).  - Aortic valve: There was no significant regurgitation.  - Mitral valve: There was no significant regurgitation.  - Left atrium: The atrium was mildly dilated.  - Right ventricle: Systolic function was normal.  - Atrial septum: No defect or patent foramen ovale was identified.  - Tricuspid valve: There was no significant regurgitation.  - Pulmonic valve: There was no significant regurgitation.  - Inferior vena cava: The vessel was normal in size. The    respirophasic diameter changes were in the normal range (>= 50%),    consistent with normal central venous pressure.  RUQ limited US 11/13 > Heterogeneous increased hepatic parenchymal echogenicity typical of steatosis or other intrinsic hepatocellular disease. Nodular hepatic contours suggesting cirrhosis.  Unremarkable sonographic appearance of the gallbladder and biliary tree.  Interim History / Subjective:  Continued to be febrile yesterday evening, Tmax 103.7.  Additionally became tachypneic, required MV again. Zosyn  started.   Objective   Blood pressure (!) 148/61, pulse (!) 120, temperature 100 F (37.8 C), temperature source Axillary, resp. rate (!) 30, height _0  (1.778 m), weight 81.6 kg, SpO2 100 %.    Vent Mode: PRVC FiO2 (%):  [28 %-40 %] 40 % Set Rate:  [26 bmp] 26 bmp Vt Set:  [440 mL-580 mL] 440 mL PEEP:  [5 cmH20] 5 cmH20 Plateau Pressure:  [14 cmH20-18 cmH20] 14 cmH20   Intake/Output Summary (Last 24 hours) at 11/10/2021 0710 Last data filed at 11/10/2021 0600 Gross per 24 hour  Intake 1303.16 ml  Output 1775 ml  Net -471.84 ml     Examination: General: Chronically ill-appearing male in no distress Cardiac: Tachycardic rate, regular rhythm, no lower extremity edema Pulm: Copious clear weight secretions from trach.  Lungs clear throughout GI: Abdomen soft nondistended  Resolved Hospital Problem list   AKI due to contrast-induced ATN, resolved Nontraumatic rhabdomyolysis, resolved Non-anion Gap Metabolic Acidosis, resolved Assessment & Plan:   Status epilepticus, resolved -No seizure seen on EEG since 11/3 -meningitis and encephalitis was ruled out with negative LP 11/2 -MRI 11/1 with no acute process  Alcohol abuse with prior SDH New Stroke 11/11 per MRI, suspected to be embolic  etiology of new embolic strokes remains unclear.  Has been febrile but cultures negative to date.  TEE attempted 11/15 but was technically difficult and scope could not be passed.  Anticoagulation to be discussed with neurology.   Plan - Neurology Following, appreciate input - AED per neurology, remains on lacosamide and keppra - Seizure precautions  - continues on ASA, plavix held at this time - long term BP goal normotensive - continue MVI, thiamine, folate  Sepsis Antibiotics: azithro 11/4-11/7, rocephin 11/4-11/7 and 11/11-11/12, linezolid 11/4-11/5, unasyn 11/8-11/11 11/2 LP CSF culture no growth 11/4 tracheal aspirate + H.flu; 11/8, 11/13 tracheal aspirate normal respiratory  flora 11/8, 11/13, blood cultures no growth 11/16 blood cultures 1/4 staph epi--likely contaminant. Otherwise, no growth 11/16 tracheal aspirate with GPC and GNR, growing staph aureus so far, pending susceptibilities. 11/17 MRSA PCR negative.  Procal 11/16 and 11/17 were 1.0 ?neurogenic fever although leukocytosis with left shift makes this less likely.  Plan Continue zosyn (started 11/17) Continue to follow blood and tracheal aspirate cultures.  Prn tylenol and cooling blanket  Acute Hypoxic Respiratory Failure requiring mechanical ventilation S/p Trach 11/11  S/p treatment for H. Flu pneumonia.  Tolerated trach collar 11/15-11/17 however developed tachypnea and tachycardia 11/17 requiring ventilation. Continues to have copious trach secretions Plan - continue VAP prevention protocol/ PPI - ongoing trach care, sutures will need to be removed 7-10 days after insertion - abx as noted above  Hypernatremia Urine osmol intermediate, elevated serum osmol, urine sodium 60 Start D5 _1 ; repeat labs this afternoon. If no significant improvement, would consider ADH challenge Continue FW Strict I/O  Hypokalemia Daily labs, replete as needed  Cirrhosis Hx of HCV (treated) Cirrhotic morphology on [place]; evidence of synthetic dysfunction with low albumin; LFTS are elevated but remain generally stable.  Plan Check HCV No further workup  Prediabetes Glucoses at goal.  insulin/ SSI stopped 11/15 due to hypoglycemia, NPO, and loss  of enteral access  Plan - continue CBG q4, add SSI back if > 180  Vasopressor Extravasation Skin Injury - left hand/arm S/p phentolamine and nitroglycerin application Left Arm Skin Tear>> skin is sluffing with pink granulating tissue beneath Unstageable sacral ulcer (POA)>> Progression despite topical barrier treatment  Plan - frequent turns - Continue dressing changes to L forearm and sacrum - new pressure injury noted to buttocks post since flexi seal  removal  - WOC   Hypertension. Blood pressure at goal.  - continue amlodipine, hydralazine, hctz - permissive longterm goal   Normocytic anemia - Hgb remains stable.  No evidence of bleeding - trend CBC, transfuse for > 7  Goals of care Daughter is still declining to make decisions regarding his goals of care. Appreciate palliative following  Best Practice (right click and "Reselect all SmartList Selections" daily)   Diet/type: tubefeeds- restart after cortrak insertion DVT prophylaxis: prophylactic heparin  GI prophylaxis: PPI Lines: Central line- LUE PICC (inserted 11/10) Foley:  condom cath Code Status:  full code Last [place]: 11/18 palliative with daughter  Mitzi Hansen, MD Internal Medicine Resident PGY-3 [facility] 11/10/2021 7:10 AM

## 2021-11-11 DIAGNOSIS — N179 Acute kidney failure, unspecified: Secondary | ICD-10-CM | POA: Diagnosis not present

## 2021-11-11 DIAGNOSIS — I639 Cerebral infarction, unspecified: Secondary | ICD-10-CM | POA: Diagnosis not present

## 2021-11-11 DIAGNOSIS — R569 Unspecified convulsions: Secondary | ICD-10-CM | POA: Diagnosis not present

## 2021-11-11 DIAGNOSIS — G9340 Encephalopathy, unspecified: Secondary | ICD-10-CM | POA: Diagnosis not present

## 2021-11-11 LAB — MAGNESIUM: Magnesium: 2.5 mg/dL — ABNORMAL HIGH (ref 1.7–2.4)

## 2021-11-11 LAB — GLUCOSE, CAPILLARY
Glucose-Capillary: 103 mg/dL — ABNORMAL HIGH (ref 70–99)
Glucose-Capillary: 131 mg/dL — ABNORMAL HIGH (ref 70–99)
Glucose-Capillary: 132 mg/dL — ABNORMAL HIGH (ref 70–99)
Glucose-Capillary: 153 mg/dL — ABNORMAL HIGH (ref 70–99)
Glucose-Capillary: 189 mg/dL — ABNORMAL HIGH (ref 70–99)
Glucose-Capillary: 92 mg/dL (ref 70–99)

## 2021-11-11 LAB — CULTURE, RESPIRATORY W GRAM STAIN

## 2021-11-11 LAB — CBC WITH DIFFERENTIAL/PLATELET
Abs Immature Granulocytes: 0.08 10*3/uL — ABNORMAL HIGH (ref 0.00–0.07)
Basophils Absolute: 0.1 10*3/uL (ref 0.0–0.1)
Basophils Relative: 0 %
Eosinophils Absolute: 0.1 10*3/uL (ref 0.0–0.5)
Eosinophils Relative: 1 %
HCT: 24.1 % — ABNORMAL LOW (ref 39.0–52.0)
Hemoglobin: 7.7 g/dL — ABNORMAL LOW (ref 13.0–17.0)
Immature Granulocytes: 1 %
Lymphocytes Relative: 17 %
Lymphs Abs: 2 10*3/uL (ref 0.7–4.0)
MCH: 28.6 pg (ref 26.0–34.0)
MCHC: 32 g/dL (ref 30.0–36.0)
MCV: 89.6 fL (ref 80.0–100.0)
Monocytes Absolute: 0.8 10*3/uL (ref 0.1–1.0)
Monocytes Relative: 7 %
Neutro Abs: 8.5 10*3/uL — ABNORMAL HIGH (ref 1.7–7.7)
Neutrophils Relative %: 74 %
Platelets: 332 10*3/uL (ref 150–400)
RBC: 2.69 MIL/uL — ABNORMAL LOW (ref 4.22–5.81)
RDW: 17.2 % — ABNORMAL HIGH (ref 11.5–15.5)
WBC: 11.5 10*3/uL — ABNORMAL HIGH (ref 4.0–10.5)
nRBC: 0 % (ref 0.0–0.2)

## 2021-11-11 LAB — RENAL FUNCTION PANEL
Albumin: 1.8 g/dL — ABNORMAL LOW (ref 3.5–5.0)
Anion gap: 7 (ref 5–15)
BUN: 33 mg/dL — ABNORMAL HIGH (ref 8–23)
CO2: 27 mmol/L (ref 22–32)
Calcium: 8.5 mg/dL — ABNORMAL LOW (ref 8.9–10.3)
Chloride: 114 mmol/L — ABNORMAL HIGH (ref 98–111)
Creatinine, Ser: 0.84 mg/dL (ref 0.61–1.24)
GFR, Estimated: >60 mL/min (ref >60)
Glucose, Bld: 111 mg/dL — ABNORMAL HIGH (ref 70–99)
Phosphorus: 3.1 mg/dL (ref 2.5–4.6)
Potassium: 3.2 mmol/L — ABNORMAL LOW (ref 3.5–5.1)
Sodium: 148 mmol/L — ABNORMAL HIGH (ref 135–145)

## 2021-11-11 LAB — CULTURE, BLOOD (ROUTINE X 2): Special Requests: ADEQUATE

## 2021-11-11 MED ORDER — CEFAZOLIN SODIUM-DEXTROSE 2-4 GM/100ML-% IV SOLN
2.0000 g | Freq: Three times a day (TID) | INTRAVENOUS | Status: DC
  Administered 2021-11-11 – 2021-11-14 (×8): 2 g via INTRAVENOUS
  Filled 2021-11-11 (×10): qty 100

## 2021-11-11 MED ORDER — POTASSIUM CHLORIDE 20 MEQ PO PACK
20.0000 meq | PACK | ORAL | Status: AC
  Administered 2021-11-11 (×2): 20 meq
  Filled 2021-11-11 (×2): qty 1

## 2021-11-11 MED ORDER — DEXTROSE 5 % IV BOLUS: 250.0000 mL | Freq: Once | INTRAVENOUS | Status: AC

## 2021-11-11 MED ORDER — POTASSIUM CHLORIDE 10 MEQ/50ML IV SOLN
10.0000 meq | INTRAVENOUS | Status: AC
  Administered 2021-11-11 (×4): 10 meq via INTRAVENOUS
  Filled 2021-11-11 (×2): qty 50

## 2021-11-11 NOTE — Progress Notes (Signed)
[facility] ADULT ICU REPLACEMENT PROTOCOL   The patient does apply for the [facility] Adult ICU Electrolyte Replacment Protocol based on the criteria listed below:   1.Exclusion criteria: TCTS patients, ECMO patients, and Dialysis patients 2. Is GFR >/= 30 ml/min? Yes.    Patient's GFR today is >60 3. Is SCr </= 2? Yes.   Patient's SCr is 0.84 mg/dL 4. Did SCr increase >/= 0.5 in 24 hours? No. 5.Pt's weight >40kg  Yes.   6. Abnormal electrolyte(s): K+ 3.2  7. Electrolytes replaced per protocol 8.  Call MD STAT for K+ </= 2.5, Phos </= 1, or Mag </= 1 Physician:  n/a  Jeffrey Mullen 11/11/2021 6:01 AM

## 2021-11-11 NOTE — Progress Notes (Signed)
NAME:  Jeffrey Mullen, MRN:  [id number], DOB:  29-Jul-1951, LOS: 67 ADMISSION DATE:  10/23/2021, CONSULTATION DATE:  10/31 REFERRING MD:  Dr. Dina Rich, CHIEF COMPLAINT:  AMS   History of Present Illness:  70 y/o M who presented to [facility] ER on 10/31 via EMS with reports of altered mental status.  Given  right sided gaze, altered and left sided weakness, code stoke called. The patient was taken emergently to CT for imaging which was negative for LVO.  While in the [place], he had a witnessed seizure.    PCCM called for ICU admission.   Chart review shows he was seen in the ER on 10/4 after passing out at the [employer].  Physical exam was negative at the time.  Labs negative.  No further imaging obtained.   Pertinent  Medical History  Alcohol Use - unclear quantity Anxiety / Depression  Congenital blindness of left eye, cataract on R Hepatitis C+, treate better d in 2016 Seizures   Significant Hospital Events: Including procedures, antibiotic start and stop dates in addition to other pertinent events   10/31 Admit with AMS, seizure, hypertensive emergency, AKI 11/1 MRI brain no acute intracranial process. No etiology is seen for the patient's seizure. 11/02 bedside LP attempted but unsuccessful  11/03  No seizures seen on EEG overnight  11/04 weaning sedation to assess for seizure reoccurrence  11/9 bronchoscopy with noted thick secretions in LLL 11/10 Left PICC placed 11/11 Trach,  Cor Track placed, New Embolic Strokes per MRI 41/32 Post Trach Day 1, some oozing from site, Echo results noted below 11/13 Low grade fever, tachy, LFT's remain elevated with ALT and Alk Phos continuing to rise.  S/p lasix  11/14 weaned PSV 10/5, no changes, foley removed 11/15 on ATC, TEE attempted- unable to pass probe, briefly on Neo, cortrak removed fort TEE, tmax 102.3 11/17 afebrile, NG on cultures, continues to have copious secretions   Last Blood Cx 11/8 Neg Last sputum Cx ( BAL) >> 11/9 >>  No growth Tracheal Aspirate 11/8>> Normal Flora Trach asp 11/13 > ngtd 11/13 BC x1 >> ngtd 11/16 trach asp >> GPC and GNR 11/16 Bcx 2 >> ngtd  MRI Brain 11/11 Interval development of multiple scattered acute ischemic infarcts involving the bilateral cerebral hemispheres and left pons, largest measuring 4.9 cm at the right occipital lobe. A central thromboembolic etiology is suspected given the various vascular distributions involved.  Echo 11/11 ECHO reviewed  - Left ventricle: The cavity size was normal. There was moderate    concentric hypertrophy. Systolic function was normal. The    estimated ejection fraction was in the range of 60% to 65%. Wall    motion was normal; there were no regional wall motion    abnormalities. Features are consistent with a pseudonormal left    ventricular filling pattern, with concomitant abnormal relaxation    and increased filling pressure (grade 2 diastolic dysfunction).  - Aortic valve: There was no significant regurgitation.  - Mitral valve: There was no significant regurgitation.  - Left atrium: The atrium was mildly dilated.  - Right ventricle: Systolic function was normal.  - Atrial septum: No defect or patent foramen ovale was identified.  - Tricuspid valve: There was no significant regurgitation.  - Pulmonic valve: There was no significant regurgitation.  - Inferior vena cava: The vessel was normal in size. The    respirophasic diameter changes were in the normal range (>= 50%),    consistent with normal central  venous pressure.   RUQ limited US 11/13 > Heterogeneous increased hepatic parenchymal echogenicity typical of steatosis or other intrinsic hepatocellular disease. Nodular hepatic contours suggesting cirrhosis.  Unremarkable sonographic appearance of the gallbladder and biliary tree.  Interim History / Subjective:  Continued to be febrile yesterday evening, Tmax 100.4   Fever curve is better Started on Zosyn  Objective    Blood pressure (!) 115/46, pulse 98, temperature 98 F (36.7 C), temperature source Axillary, resp. rate 19, height _0  (1.778 m), weight 83 kg, SpO2 100 %.    Vent Mode: PSV;CPAP FiO2 (%):  [40 %] 40 % Set Rate:  [26 bmp] 26 bmp Vt Set:  [440 mL] 440 mL PEEP:  [5 cmH20] 5 cmH20 Pressure Support:  [10 cmH20] 10 cmH20   Intake/Output Summary (Last 24 hours) at 11/11/2021 0910 Last data filed at 11/11/2021 0800 Gross per 24 hour  Intake 3510.56 ml  Output 2475 ml  Net 1035.56 ml    Examination: General: Chronically ill-appearing, in no distress Cardiac: Tachycardic Pulm: Copious clear weight secretions from trach.  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 -Source of embolic stroke is unclear -TEE attempted 11/15 but was technically difficult and unsuccessful  Regarding stroke -Neurology continues to follow -Antiepileptic drugs per neurology -On lacosamide and Keppra -Seizure precautions -Normotensive BP goal  Sepsis -Zosyn started 11/17 -We will continue to follow cultures both tracheal and blood cultures have been negative -Tylenol, cooling blanket -Fever trend is down  Acute hypoxemic respiratory failure require mechanical ventilation -S/p tracheostomy 11/11 -Still with copious secretions -Tolerating weaning -Continue VAP prevention protocol  Hypernatremia -Improving -Continue free water  Hypokalemia -Replete  Cirrhosis -Continue to monitor  Prediabetes -Continue  SSI  Hypertension -Amlodipine, hydralazine, hydrochlorothiazide  Anemia -Continue to trend   Goals of care Daughter is still declining to make decisions regarding his goals of care. Appreciate palliative following  Best Practice (right click and "Reselect all SmartList Selections" daily)   Diet/type: tubefeeds- restart after cortrak insertion DVT prophylaxis: prophylactic heparin  GI prophylaxis: PPI Lines: Central line- LUE PICC (inserted 11/10) Foley:  condom cath Code Status:  full code Last [place]: 11/18 appreciate palliative care involvement  The patient is critically ill with multiple organ systems failure and requires high complexity decision making for assessment and support, frequent evaluation and titration of therapies, application of advanced monitoring technologies and extensive interpretation of multiple databases. Critical Care Time devoted to patient care services described in this note independent of APP/resident time (if applicable)  is 35 minutes.   Sherrilyn Rist MD [place] [facility] Personal pager: See Amion If unanswered, please page CCM On-call: [phone]

## 2021-11-12 DIAGNOSIS — J9602 Acute respiratory failure with hypercapnia: Secondary | ICD-10-CM | POA: Diagnosis not present

## 2021-11-12 DIAGNOSIS — N179 Acute kidney failure, unspecified: Secondary | ICD-10-CM | POA: Diagnosis not present

## 2021-11-12 DIAGNOSIS — Z515 Encounter for palliative care: Secondary | ICD-10-CM | POA: Diagnosis not present

## 2021-11-12 DIAGNOSIS — I639 Cerebral infarction, unspecified: Secondary | ICD-10-CM | POA: Diagnosis not present

## 2021-11-12 DIAGNOSIS — G9340 Encephalopathy, unspecified: Secondary | ICD-10-CM | POA: Diagnosis not present

## 2021-11-12 LAB — HCV RT-PCR, QUANT (NON-GRAPH): Hepatitis C Quantitation: NOT DETECTED IU/mL

## 2021-11-12 LAB — RENAL FUNCTION PANEL
Albumin: 1.9 g/dL — ABNORMAL LOW (ref 3.5–5.0)
Anion gap: 9 (ref 5–15)
BUN: 30 mg/dL — ABNORMAL HIGH (ref 8–23)
CO2: 25 mmol/L (ref 22–32)
Calcium: 8.4 mg/dL — ABNORMAL LOW (ref 8.9–10.3)
Chloride: 107 mmol/L (ref 98–111)
Creatinine, Ser: 0.99 mg/dL (ref 0.61–1.24)
GFR, Estimated: >60 mL/min (ref >60)
Glucose, Bld: 110 mg/dL — ABNORMAL HIGH (ref 70–99)
Phosphorus: 2.6 mg/dL (ref 2.5–4.6)
Potassium: 3.6 mmol/L (ref 3.5–5.1)
Sodium: 141 mmol/L (ref 135–145)

## 2021-11-12 LAB — HCV AB W REFLEX TO QUANT PCR: HCV Ab: >11 s/co ratio — ABNORMAL HIGH (ref 0.0–0.9)

## 2021-11-12 LAB — GLUCOSE, CAPILLARY
Glucose-Capillary: 116 mg/dL — ABNORMAL HIGH (ref 70–99)
Glucose-Capillary: 124 mg/dL — ABNORMAL HIGH (ref 70–99)
Glucose-Capillary: 127 mg/dL — ABNORMAL HIGH (ref 70–99)
Glucose-Capillary: 130 mg/dL — ABNORMAL HIGH (ref 70–99)
Glucose-Capillary: 150 mg/dL — ABNORMAL HIGH (ref 70–99)
Glucose-Capillary: 97 mg/dL (ref 70–99)

## 2021-11-12 MED ORDER — POTASSIUM CHLORIDE 20 MEQ PO PACK
40.0000 meq | PACK | Freq: Once | ORAL | Status: AC
  Administered 2021-11-12: 40 meq
  Filled 2021-11-12: qty 2

## 2021-11-12 MED ORDER — FREE WATER
200.0000 mL | Freq: Three times a day (TID) | Status: DC
  Administered 2021-11-12 – 2021-11-15 (×9): 200 mL

## 2021-11-12 NOTE — Progress Notes (Signed)
NAME:  Jeffrey Mullen, MRN:  [id number], DOB:  02-May-1951, LOS: 33 ADMISSION DATE:  10/23/2021, CONSULTATION DATE:  10/31 REFERRING MD:  Dr. Dina Rich, CHIEF COMPLAINT:  AMS   History of Present Illness:  70 y/o M who presented to [facility] ER on 10/31 via EMS with reports of altered mental status.  Given  right sided gaze, altered and left sided weakness, code stoke called. The patient was taken emergently to CT for imaging which was negative for LVO.  While in the [place], he had a witnessed seizure.    PCCM called for ICU admission.   Chart review shows he was seen in the ER on 10/4 after passing out at the [employer].  Physical exam was negative at the time.  Labs negative.  No further imaging obtained.   Pertinent  Medical History  Alcohol Use - unclear quantity Anxiety / Depression  Congenital blindness of left eye, cataract on R Hepatitis C+, treated, better in 2016 Seizures   Significant Hospital Events: Including procedures, antibiotic start and stop dates in addition to other pertinent events   10/31 Admit with AMS, seizure, hypertensive emergency, AKI 11/1 MRI brain no acute intracranial process. No etiology is seen for the patient's seizure. 11/02 bedside LP attempted but unsuccessful  11/03  No seizures seen on EEG overnight  11/04 weaning sedation to assess for seizure reoccurrence  11/9 bronchoscopy with noted thick secretions in LLL 11/10 Left PICC placed 11/11 Trach,  Cor Track placed, New Embolic Strokes per MRI 16/57 Post Trach Day 1, some oozing from site, Echo results noted below 11/13 Low grade fever, tachy, LFT's remain elevated with ALT and Alk Phos continuing to rise.  S/p lasix  11/14 weaned PSV 10/5, no changes, foley removed 11/15 on ATC, TEE attempted- unable to pass probe, briefly on Neo, cortrak removed fort TEE, tmax 102.3 11/17 afebrile, NG on cultures, continues to have copious secretions 11/20 fever trend is down, on Ancef   Last Blood Cx 11/8  Neg Last sputum Cx ( BAL) >> 11/9 >> No growth Tracheal Aspirate 11/8>> Normal Flora Trach asp 11/13 > ngtd 11/13 BC x1 >> ngtd 11/16 trach asp >> GPC and GNR 11/16 Bcx 2 >> ngtd  MRI Brain 11/11 Interval development of multiple scattered acute ischemic infarcts involving the bilateral cerebral hemispheres and left pons, largest measuring 4.9 cm at the right occipital lobe. A central thromboembolic etiology is suspected given the various vascular distributions involved.  Echo 11/11 ECHO reviewed  - Left ventricle: The cavity size was normal. There was moderate    concentric hypertrophy. Systolic function was normal. The    estimated ejection fraction was in the range of 60% to 65%. Wall    motion was normal; there were no regional wall motion    abnormalities. Features are consistent with a pseudonormal left    ventricular filling pattern, with concomitant abnormal relaxation    and increased filling pressure (grade 2 diastolic dysfunction).  - Aortic valve: There was no significant regurgitation.  - Mitral valve: There was no significant regurgitation.  - Left atrium: The atrium was mildly dilated.  - Right ventricle: Systolic function was normal.  - Atrial septum: No defect or patent foramen ovale was identified.  - Tricuspid valve: There was no significant regurgitation.  - Pulmonic valve: There was no significant regurgitation.  - Inferior vena cava: The vessel was normal in size. The    respirophasic diameter changes were in the normal range (>= 50%),  consistent with normal central venous pressure.   RUQ limited US 11/13 > Heterogeneous increased hepatic parenchymal echogenicity typical of steatosis or other intrinsic hepatocellular disease. Nodular hepatic contours suggesting cirrhosis.  Unremarkable sonographic appearance of the gallbladder and biliary tree.  Interim History / Subjective:  Fever curve is better was started on Zosyn, switched to Ancef   Objective    Blood pressure 134/60, pulse (!) 103, temperature 100.3 F (37.9 C), temperature source Axillary, resp. rate (!) 27, height _0  (1.778 m), weight 83 kg, SpO2 99 %.    Vent Mode: PRVC FiO2 (%):  [40 %] 40 % Set Rate:  [26 bmp] 26 bmp Vt Set:  [440 mL] 440 mL PEEP:  [5 cmH20] 5 cmH20 Plateau Pressure:  [20 cmH20] 20 cmH20   Intake/Output Summary (Last 24 hours) at 11/12/2021 1037 Last data filed at 11/12/2021 0800 Gross per 24 hour  Intake 2391.29 ml  Output 4250 ml  Net -1858.71 ml   Examination: General: Chronically ill-appearing, in no distress Cardiac: S1-S2 appreciated, tachycardic Pulm: Increased secretions, mild rhonchi GI: Soft, bowel sounds appreciated  Labs reviewed with potassium of 3.2-being repleted Anemia  Resolved Hospital Problem list   AKI due to contrast-induced ATN, resolved Nontraumatic rhabdomyolysis, resolved Non-anion Gap Metabolic Acidosis, resolved Assessment & Plan:   Status epilepticus-resolved -No seizures on EEG -LP ruled out meningitis and encephalitis -MRI head 11/1 with no acute process  Alcohol abuse with prior SDH New stroke 11/11, suspected to be embolic -Source of stroke is unclear -TEE at 1030 1115 but was technically difficult and unsuccessful  Regarding stroke -Neurology continues to follow -Antiepileptic drugs per neurology -On lacosamide and Keppra -Seizure precautions -Normotensive BP goal  Sepsis -Zosyn started 11/17 -Switched to Ancef 11/19 -Fever trend is down  Acute hypoxemic respiratory failure, on mechanical ventilation -Status post tracheostomy 11/11 -Continue weaning -Continue VAP prevention protocol  Hypernatremia -Improving -Continue free water  Hypokalemia -Replete  Cirrhosis -Continue to monitor  Prediabetes -Continue SSI  Hypertension -Amlodipine, hydralazine, hydrochlorothiazide  Anemia -Continue to trend   Goals of care Daughter is still declining to make decisions regarding his  goals of care. Appreciate palliative following Two-physician DNR status will be most appropriate  Appreciate palliative care involvement  Best Practice (right click and "Reselect all SmartList Selections" daily)   Diet/type: tubefeeds- restart after cortrak insertion DVT prophylaxis: prophylactic heparin  GI prophylaxis: PPI Lines: Central line- LUE PICC (inserted 11/10) Foley:  condom cath Code Status:  full code Last [place]: 11/18 appreciate palliative care involvement  The patient is critically ill with multiple organ systems failure and requires high complexity decision making for assessment and support, frequent evaluation and titration of therapies, application of advanced monitoring technologies and extensive interpretation of multiple databases. Critical Care Time devoted to patient care services described in this note independent of APP/resident time (if applicable)  is 32 minutes.   Sherrilyn Rist MD [place] [facility] Critical [facility] Personal pager: See Amion If unanswered, please page CCM On-call: [phone]

## 2021-11-12 NOTE — Progress Notes (Signed)
Daily Progress Note   Patient Name: Jeffrey Mullen       Date: 11/12/2021 DOB: 1951/11/18  Age: 70 y.o. MRN#: [id number] Attending Physician: Laurin Coder, MD Primary Care Physician: [name], NP Admit Date: 10/23/2021  Reason for Consultation/Follow-up: Establishing goals of care  Patient Profile/HPI:    70 y.o. male  with past medical history of alcohol abuse, anxiety, and hepatitis C with treatment in the past admitted on 10/23/2021 with acute encephalopathy and found to have tonic-clonic seizure requiring intubation and airway protection. MRI showed multiple scattered acute ischemic infarcts involving bilateral cerebral hemispheres, and L pons.    Tracheostomy was placed on 11/11.  TEE was attempted on 11/15 but unsuccessful.  Patient continues to have intermittent fevers without clear etiology.  He also continues to have copious oral and tracheal secretions but no clear purulence and follow-up respiratory cultures have been negative to date.   Palliative medicine team was consulted to help determine goals of care and decision maker in light of complex family dynamics.  Subjective:  Per RN patient beginning to follow some commands.  I was able to reach [employer]- patient's former significant other and mother to patient's daughter. Gave update on his status.  [name] stated she and her daughter were very overwhelmed and at this time she wishes to remove herself from being the surrogate decision maker for patient.         Vital Signs: BP 134/60   Pulse (!) 103   Temp 100.3 F (37.9 C) (Axillary)   Resp (!) 27   Ht 5\' 10"  (1.778 m)   Wt 83 kg   SpO2 99%   BMI 26.26 kg/m  SpO2: SpO2: 99 % O2 Device: O2 Device: Ventilator O2 Flow Rate: O2 Flow Rate (L/min): 5  L/min  Intake/output summary:  Intake/Output Summary (Last 24 hours) at 11/12/2021 1111 Last data filed at 11/12/2021 0800 Gross per 24 hour  Intake 1680.8 ml  Output 4250 ml  Net -2569.2 ml   LBM: Last BM Date: 11/12/21 Baseline Weight: Weight: 79.2 kg Most recent weight: Weight: 83 kg          Palliative Care Assessment & Plan    Assessment/Recommendations/Plan  [name] no longer wishes to be surrogate decision maker Recommend SW conduct due diligence search and make referral  to APS for state guardianship if alternate decision maker cannot be located - PMT has made attempts and have not been able to locate anyone PMT will be glad to discuss [place] when surrogate decision maker has been found- until then recommend following [place] [id number] indicating that attending physician with confirmation of another physician would be patient's surrogate  Code Status: Full code  Prognosis:  Unable to determine  Discharge Planning: To Be Determined  Care plan was discussed with patient's care team.   Thank you for allowing the Palliative Medicine Team to assist in the care of this patient.  Total time:  35 mins Prolonged billing: No     Greater than 50%  of this time was spent counseling and coordinating care related to the above assessment and plan.  Mariana Kaufman, AGNP-C Palliative Medicine   Please contact Palliative Medicine Team phone at [phone] for questions and concerns.

## 2021-11-13 ENCOUNTER — Inpatient Hospital Stay (HOSPITAL_COMMUNITY): Payer: Medicare Other

## 2021-11-13 DIAGNOSIS — Z930 Tracheostomy status: Secondary | ICD-10-CM | POA: Diagnosis not present

## 2021-11-13 DIAGNOSIS — J9601 Acute respiratory failure with hypoxia: Secondary | ICD-10-CM | POA: Diagnosis not present

## 2021-11-13 DIAGNOSIS — E876 Hypokalemia: Secondary | ICD-10-CM

## 2021-11-13 DIAGNOSIS — Z9911 Dependence on respirator [ventilator] status: Secondary | ICD-10-CM | POA: Diagnosis not present

## 2021-11-13 LAB — RENAL FUNCTION PANEL
Albumin: 1.8 g/dL — ABNORMAL LOW (ref 3.5–5.0)
Anion gap: 9 (ref 5–15)
BUN: 29 mg/dL — ABNORMAL HIGH (ref 8–23)
CO2: 27 mmol/L (ref 22–32)
Calcium: 8.9 mg/dL (ref 8.9–10.3)
Chloride: 109 mmol/L (ref 98–111)
Creatinine, Ser: 0.89 mg/dL (ref 0.61–1.24)
GFR, Estimated: >60 mL/min (ref >60)
Glucose, Bld: 152 mg/dL — ABNORMAL HIGH (ref 70–99)
Phosphorus: 2.8 mg/dL (ref 2.5–4.6)
Potassium: 3.5 mmol/L (ref 3.5–5.1)
Sodium: 145 mmol/L (ref 135–145)

## 2021-11-13 LAB — CBC WITH DIFFERENTIAL/PLATELET
Abs Immature Granulocytes: 0.05 10*3/uL (ref 0.00–0.07)
Basophils Absolute: 0.1 10*3/uL (ref 0.0–0.1)
Basophils Relative: 1 %
Eosinophils Absolute: 0.2 10*3/uL (ref 0.0–0.5)
Eosinophils Relative: 2 %
HCT: 24.4 % — ABNORMAL LOW (ref 39.0–52.0)
Hemoglobin: 7.5 g/dL — ABNORMAL LOW (ref 13.0–17.0)
Immature Granulocytes: 1 %
Lymphocytes Relative: 21 %
Lymphs Abs: 1.6 10*3/uL (ref 0.7–4.0)
MCH: 27.7 pg (ref 26.0–34.0)
MCHC: 30.7 g/dL (ref 30.0–36.0)
MCV: 90 fL (ref 80.0–100.0)
Monocytes Absolute: 0.5 10*3/uL (ref 0.1–1.0)
Monocytes Relative: 7 %
Neutro Abs: 5.3 10*3/uL (ref 1.7–7.7)
Neutrophils Relative %: 68 %
Platelets: 393 10*3/uL (ref 150–400)
RBC: 2.71 MIL/uL — ABNORMAL LOW (ref 4.22–5.81)
RDW: 16.9 % — ABNORMAL HIGH (ref 11.5–15.5)
WBC: 7.6 10*3/uL (ref 4.0–10.5)
nRBC: 0 % (ref 0.0–0.2)

## 2021-11-13 LAB — GLUCOSE, CAPILLARY
Glucose-Capillary: 125 mg/dL — ABNORMAL HIGH (ref 70–99)
Glucose-Capillary: 130 mg/dL — ABNORMAL HIGH (ref 70–99)
Glucose-Capillary: 130 mg/dL — ABNORMAL HIGH (ref 70–99)
Glucose-Capillary: 146 mg/dL — ABNORMAL HIGH (ref 70–99)
Glucose-Capillary: 157 mg/dL — ABNORMAL HIGH (ref 70–99)
Glucose-Capillary: 167 mg/dL — ABNORMAL HIGH (ref 70–99)

## 2021-11-13 LAB — CULTURE, BLOOD (ROUTINE X 2)
Culture: NO GROWTH
Special Requests: ADEQUATE

## 2021-11-13 MED ORDER — SODIUM CHLORIDE 3 % IN NEBU
4.0000 mL | INHALATION_SOLUTION | Freq: Three times a day (TID) | RESPIRATORY_TRACT | Status: AC
  Administered 2021-11-13 – 2021-11-15 (×8): 4 mL via RESPIRATORY_TRACT
  Filled 2021-11-13 (×8): qty 4

## 2021-11-13 MED ORDER — ATORVASTATIN CALCIUM 40 MG PO TABS
40.0000 mg | ORAL_TABLET | Freq: Every day | ORAL | Status: DC
  Administered 2021-11-13 – 2021-12-03 (×21): 40 mg
  Filled 2021-11-13 (×21): qty 1

## 2021-11-13 MED ORDER — ATORVASTATIN CALCIUM 40 MG PO TABS
40.0000 mg | ORAL_TABLET | Freq: Every day | ORAL | Status: DC
Start: 2021-11-13 — End: 2021-11-13

## 2021-11-13 MED ORDER — JEVITY 1.5 CAL/FIBER PO LIQD
1000.0000 mL | ORAL | Status: DC
  Administered 2021-11-13 – 2021-11-18 (×6): 1000 mL
  Filled 2021-11-13 (×8): qty 1000

## 2021-11-13 MED ORDER — ALBUTEROL SULFATE (2.5 MG/3ML) 0.083% IN NEBU
2.5000 mg | INHALATION_SOLUTION | Freq: Three times a day (TID) | RESPIRATORY_TRACT | Status: DC
  Administered 2021-11-13 – 2021-11-20 (×23): 2.5 mg via RESPIRATORY_TRACT
  Filled 2021-11-13 (×24): qty 3

## 2021-11-13 MED ORDER — POTASSIUM CHLORIDE 20 MEQ PO PACK
40.0000 meq | PACK | Freq: Once | ORAL | Status: AC
  Administered 2021-11-13: 40 meq
  Filled 2021-11-13: qty 2

## 2021-11-13 NOTE — Progress Notes (Signed)
Trach sutures removed per NP. New trach ties secured.

## 2021-11-13 NOTE — TOC Progression Note (Addendum)
Transition of Care [facility]) - Progression Note    Patient Details  Name: [name] MRN: [id number] Date of Birth: 08-07-1951  Transition of [facility]) CM/SW [place], LCSW Phone Number: 11/13/2021, 1:00 PM  Clinical Narrative:    12:59pm-CSW left voicemail for [facility] APS to assist in locating a decision maker for patient.   1:52pm-CSW received return call from APS and completed report.   Expected Discharge Plan: Long Term Acute Care (LTAC) Barriers to Discharge: Continued Medical Work up  Expected Discharge Plan and Services Expected Discharge Plan: [place] (LTAC)   Discharge Planning Services: CM Consult   Living arrangements for the past 2 months: Apartment                                       Social Determinants of Health (SDOH) Interventions    Readmission Risk Interventions No flowsheet data found.

## 2021-11-13 NOTE — Progress Notes (Signed)
Nutrition Follow-up  DOCUMENTATION CODES:   Not applicable  INTERVENTION:    Tube feeding via Cortrak tube: Increase Jevity 1.5 to 60 ml/h (1440 ml per day) Continue Prosource TF 90 ml BID  Provides 2320 kcal, 135 gm protein, 1094 ml free water daily  MVI with minerals via tube  -1 packet Juven BID, each packet provides 95 calories, 2.5 grams of protein (collagen), and 9.8 grams of carbohydrate (3 grams sugar); also contains 7 grams of L-arginine and L-glutamine, 300 mg vitamin C, 15 mg vitamin E, 1.2 mcg vitamin B-12, 9.5 mg zinc, 200 mg calcium, and 1.5 g  Calcium Beta-hydroxy-Beta-methylbutyrate to support wound healing  200 ml free water every 8 hours  Total free water: 1694 ml   NUTRITION DIAGNOSIS:   Inadequate oral intake related to inability to eat as evidenced by NPO status. Ongoing.   GOAL:   Patient will meet greater than or equal to 90% of their needs Met with TF.   MONITOR:   Vent status, Labs, Weight trends, TF tolerance, Skin, I & O's  REASON FOR ASSESSMENT:   Ventilator    ASSESSMENT:   Pt with PMH of hepatitis C, alcohol abuse and seizures admitted with with status epilepticus.  Pt with ongoing fevers, on Abx TEE unsuccessful Palliative following  11/11 new emboilc CVA; trach placed 11/15 cortrak placed; pulled during TEE 11/16 cortrak replaced; tip in duodenal bulb  Patient is currently intubated on ventilator support MV: 11.5 L/min Temp (24hrs), Avg:99.6 F (37.6 C), Min:98.9 F (37.2 C), Max:101.3 F (38.5 C)  Medications reviewed and include: colace, folic acid, SSI, MVI with minerals, protonix, miralax, thiamine  Labs reviewed:  CBG's: 97-150  UOP: 3150 ml  I&O: -8.4 L  Current TF: Jevity 1.5 at 55 ml/h with Prosource TF 90 ml BID Provides 2140 kcal, 128 gm protein, 1003 ml free water daily  Diet Order:   Diet Order             Diet NPO time specified  Diet effective midnight                   EDUCATION NEEDS:    Not appropriate for education at this time  Skin:  Skin Assessment: Skin Integrity Issues: Skin Integrity Issues:: Stage II DTI: buttocks, R/L heels, L elbow Stage II: anus  Last BM:  11/21 type 6  Height:   Ht Readings from Last 1 Encounters:  11/02/21 '5\' 10"'  (1.778 m)    Weight:   Wt Readings from Last 1 Encounters:  11/13/21 82.5 kg    Ideal Body Weight:  75.5 kg  BMI:  Body mass index is 26.1 kg/m.  Estimated Nutritional Needs:   Kcal:  2100-2300  Protein:  120-140 grams  Fluid:  > 2 /day  Lockie Pares., RD, LDN, CNSC See AMiON for contact information

## 2021-11-13 NOTE — Progress Notes (Signed)
NAME:  Jeffrey Mullen, MRN:  [id number], DOB:  1951-09-12, LOS: 21 ADMISSION DATE:  10/23/2021, CONSULTATION DATE:  10/31 REFERRING MD:  Dr. Dina Rich, CHIEF COMPLAINT:  AMS   History of Present Illness:  70 y/o M who presented to [facility] ER on 10/31 via EMS with reports of altered mental status.  Given  right sided gaze, altered and left sided weakness, code stoke called. The patient was taken emergently to CT for imaging which was negative for LVO.  While in the [place], he had a witnessed seizure.    PCCM called for ICU admission.   Chart review shows he was seen in the ER on 10/4 after passing out at the [employer].  Physical exam was negative at the time.  Labs negative.  No further imaging obtained.   Pertinent  Medical History  Alcohol Use - unclear quantity Anxiety / Depression  Congenital blindness of left eye, cataract on R Hepatitis C+, treated, better in 2016 Seizures   Significant Hospital Events: Including procedures, antibiotic start and stop dates in addition to other pertinent events   10/31 Admit with AMS, seizure, hypertensive emergency, AKI 11/1 MRI brain no acute intracranial process. No etiology is seen for the patient's seizure. 11/02 bedside LP attempted but unsuccessful  11/03  No seizures seen on EEG overnight  11/04 weaning sedation to assess for seizure reoccurrence  11/9 bronchoscopy with noted thick secretions in LLL 11/10 Left PICC placed 11/11 Trach,  Cor Track placed, New Embolic Strokes per MRI 76/16 Post Trach Day 1, some oozing from site, Echo results noted below 11/13 Low grade fever, tachy, LFT's remain elevated with ALT and Alk Phos continuing to rise.  S/p lasix  11/14 weaned PSV 10/5, no changes, foley removed 11/15 on ATC, TEE attempted- unable to pass probe, briefly on Neo, cortrak removed fort TEE, tmax 102.3 11/17 afebrile, NG on cultures, continues to have copious secretions 11/20 fever trend is down, on Ancef   Last Blood Cx 11/8  Neg Last sputum Cx ( BAL) >> 11/9 >> No growth Tracheal Aspirate 11/8>> Normal Flora Trach asp 11/13 > ngtd 11/13 BC x1 >> ngtd 11/16 trach asp >> GPC and GNR; staph Aureus 11/16 Bcx 2 >> ngtd  MRI Brain 11/11 Interval development of multiple scattered acute ischemic infarcts involving the bilateral cerebral hemispheres and left pons, largest measuring 4.9 cm at the right occipital lobe. A central thromboembolic etiology is suspected given the various vascular distributions involved.  Echo 11/11 ECHO reviewed  - Left ventricle: The cavity size was normal. There was moderate    concentric hypertrophy. Systolic function was normal. The    estimated ejection fraction was in the range of 60% to 65%. Wall    motion was normal; there were no regional wall motion    abnormalities. Features are consistent with a pseudonormal left    ventricular filling pattern, with concomitant abnormal relaxation    and increased filling pressure (grade 2 diastolic dysfunction).  - Aortic valve: There was no significant regurgitation.  - Mitral valve: There was no significant regurgitation.  - Left atrium: The atrium was mildly dilated.  - Right ventricle: Systolic function was normal.  - Atrial septum: No defect or patent foramen ovale was identified.  - Tricuspid valve: There was no significant regurgitation.  - Pulmonic valve: There was no significant regurgitation.  - Inferior vena cava: The vessel was normal in size. The    respirophasic diameter changes were in the normal range (>=  50%),    consistent with normal central venous pressure.   RUQ limited US 11/13 > Heterogeneous increased hepatic parenchymal echogenicity typical of steatosis or other intrinsic hepatocellular disease. Nodular hepatic contours suggesting cirrhosis.  Unremarkable sonographic appearance of the gallbladder and biliary tree.  Interim History / Subjective:  Fever trending up 101 degrees F from 99; remains on ancef K  3.5 Patient is trached on mechanical ventilation; peak pressures in 40s. Patient suctioned with inner cannula replaced with improvement Patient given fentanyl but arouses to command; R pupil sluggish reactive to light; L pupil cataract present; cough/gag reflex in tact  Objective   Blood pressure (!) 128/54, pulse 91, temperature (!) 101.3 F (38.5 C), temperature source Axillary, resp. rate (!) 26, height 5' 10" (1.778 m), weight 82.5 kg, SpO2 100 %.    Vent Mode: PRVC FiO2 (%):  [40 %] 40 % Set Rate:  [26 bmp] 26 bmp Vt Set:  [440 mL] 440 mL PEEP:  [5 cmH20] 5 cmH20 Plateau Pressure:  [14 cmH20-17 cmH20] 17 cmH20   Intake/Output Summary (Last 24 hours) at 11/13/2021 0034 Last data filed at 11/13/2021 0600 Gross per 24 hour  Intake 1774.85 ml  Output 3150 ml  Net -1375.15 ml    Examination: General:   ill appearing trach patient on mech vent HEENT: MM pink/moist; trach in place; L eye cataract present Neuro:  sedated with fentanyl; opens eyes to command; R pupil sluggish to light CV: s1s2, sinus tachy, no m/r/g PULM:  dim clear BS bilaterally; on mech vent PRVC; thick white tracheal secretions GI: soft, bsx4 active  Extremities: warm/dry, no edema  Skin: no rashes or lesions appreciated  Labs: K 3.5: being repleted  Resolved Hospital Problem list   AKI due to contrast-induced ATN, resolved Nontraumatic rhabdomyolysis, resolved Non-anion Gap Metabolic Acidosis, resolved Assessment & Plan:   Suspected emboic stroke 11/11: TEE difficult and unsuccessful; source unclear Alcohol abuse with prior SDH Status epilepticus-resolved; no seizures on EEG; LP r/o meningitis/encephalitis; MRI negative P: -Neuro following -continue lacosamide and keppra -seizure precautions in place -normotensive BP goal  Sepsis: started zosyn 11/17 -> Ancef 11/19 P: -continue ancef -continue to trend fever -prn tylenol -check CBC -consider reculturing and broader antibiotics if worsening  fever  Acute hypoxemic respiratory failure, on mechanical ventilation Status post tracheostomy 11/11 P: -continue mechanical ventilation -PSV weaning trials as tolerated -VAP prevention in place -Trach care per protocol -will remove trach sutures -CPT and hypertonic saline nebs TID -albuterol nebs TID -consider TCT later today if tolerating PSV trials okay  Hypernatremia: improved P: -trend bmp -continue free water 200 mL q8  Hypokalemia P: -Repleted this am -trend bmp  Cirrhosis P: -supportive care  Prediabetes P: -Continue SSI  HTN P: -continue Amlodipine, hydralazine, hydrochlorothiazide  Anemia -Continue to trend  Goals of care Daughter is still declining to make decisions regarding his goals of care. Palliative following.  Two-physician DNR status will be most appropriate  Appreciate palliative care involvement  Best Practice (right click and "Reselect all SmartList Selections" daily)   Diet/type: tubefeeds- restart after cortrak insertion DVT prophylaxis: prophylactic heparin  GI prophylaxis: PPI Lines: Central line- LUE PICC (inserted 11/10) Foley:  condom cath Code Status:  full code Last GOC: 11/18 appreciate palliative care involvement  Critical care time: 35 minutes   JD Rexene Agent [facility] 11/13/2021, 7:25 AM  Please see [email or web address] for pager details.  From 7A-7P if no response, please call [phone]. After hours, please call ELink [phone].

## 2021-11-14 DIAGNOSIS — J9601 Acute respiratory failure with hypoxia: Secondary | ICD-10-CM | POA: Diagnosis not present

## 2021-11-14 DIAGNOSIS — R1310 Dysphagia, unspecified: Secondary | ICD-10-CM

## 2021-11-14 DIAGNOSIS — Z9911 Dependence on respirator [ventilator] status: Secondary | ICD-10-CM | POA: Diagnosis not present

## 2021-11-14 DIAGNOSIS — Z930 Tracheostomy status: Secondary | ICD-10-CM | POA: Diagnosis not present

## 2021-11-14 LAB — RENAL FUNCTION PANEL
Albumin: 2 g/dL — ABNORMAL LOW (ref 3.5–5.0)
Anion gap: 8 (ref 5–15)
BUN: 25 mg/dL — ABNORMAL HIGH (ref 8–23)
CO2: 27 mmol/L (ref 22–32)
Calcium: 9.4 mg/dL (ref 8.9–10.3)
Chloride: 110 mmol/L (ref 98–111)
Creatinine, Ser: 0.86 mg/dL (ref 0.61–1.24)
GFR, Estimated: >60 mL/min (ref >60)
Glucose, Bld: 158 mg/dL — ABNORMAL HIGH (ref 70–99)
Phosphorus: 3.4 mg/dL (ref 2.5–4.6)
Potassium: 3.9 mmol/L (ref 3.5–5.1)
Sodium: 145 mmol/L (ref 135–145)

## 2021-11-14 LAB — GLUCOSE, CAPILLARY
Glucose-Capillary: 161 mg/dL — ABNORMAL HIGH (ref 70–99)
Glucose-Capillary: 143 mg/dL — ABNORMAL HIGH (ref 70–99)
Glucose-Capillary: 140 mg/dL — ABNORMAL HIGH (ref 70–99)
Glucose-Capillary: 170 mg/dL — ABNORMAL HIGH (ref 70–99)
Glucose-Capillary: 158 mg/dL — ABNORMAL HIGH (ref 70–99)
Glucose-Capillary: 150 mg/dL — ABNORMAL HIGH (ref 70–99)

## 2021-11-14 LAB — CBC
HCT: 23.5 % — ABNORMAL LOW (ref 39.0–52.0)
Hemoglobin: 7.6 g/dL — ABNORMAL LOW (ref 13.0–17.0)
MCH: 28.1 pg (ref 26.0–34.0)
MCHC: 32.3 g/dL (ref 30.0–36.0)
MCV: 87 fL (ref 80.0–100.0)
Platelets: 396 10*3/uL (ref 150–400)
RBC: 2.7 MIL/uL — ABNORMAL LOW (ref 4.22–5.81)
RDW: 16.8 % — ABNORMAL HIGH (ref 11.5–15.5)
WBC: 9 10*3/uL (ref 4.0–10.5)
nRBC: 0 % (ref 0.0–0.2)

## 2021-11-14 LAB — HEPATIC FUNCTION PANEL
ALT: 87 U/L — ABNORMAL HIGH (ref 0–44)
AST: 94 U/L — ABNORMAL HIGH (ref 15–41)
Albumin: 2 g/dL — ABNORMAL LOW (ref 3.5–5.0)
Alkaline Phosphatase: 371 U/L — ABNORMAL HIGH (ref 38–126)
Bilirubin, Direct: 0.1 mg/dL (ref 0.0–0.2)
Indirect Bilirubin: 0.3 mg/dL (ref 0.3–0.9)
Total Bilirubin: 0.4 mg/dL (ref 0.3–1.2)
Total Protein: 7.3 g/dL (ref 6.5–8.1)

## 2021-11-14 LAB — MAGNESIUM: Magnesium: 2.2 mg/dL (ref 1.7–2.4)

## 2021-11-14 MED ORDER — CARVEDILOL 3.125 MG PO TABS
6.2500 mg | ORAL_TABLET | Freq: Two times a day (BID) | ORAL | Status: DC
  Administered 2021-11-14: 6.25 mg
  Filled 2021-11-14: qty 2

## 2021-11-14 MED ORDER — POTASSIUM CHLORIDE 20 MEQ PO PACK
20.0000 meq | PACK | Freq: Once | ORAL | Status: AC
  Administered 2021-11-14: 20 meq
  Filled 2021-11-14: qty 1

## 2021-11-14 MED ORDER — PIPERACILLIN-TAZOBACTAM 3.375 G IVPB
3.3750 g | Freq: Three times a day (TID) | INTRAVENOUS | Status: DC
  Filled 2021-11-14: qty 50

## 2021-11-14 MED ORDER — VANCOMYCIN HCL 1750 MG/350ML IV SOLN
1750.0000 mg | Freq: Once | INTRAVENOUS | Status: AC
Start: 2021-11-14 — End: 2021-11-14
  Administered 2021-11-14: 1750 mg via INTRAVENOUS
  Filled 2021-11-14: qty 350

## 2021-11-14 MED ORDER — AMLODIPINE BESYLATE 10 MG PO TABS
10.0000 mg | ORAL_TABLET | Freq: Every day | ORAL | Status: DC
  Administered 2021-11-14 – 2021-12-04 (×21): 10 mg
  Filled 2021-11-14 (×21): qty 1

## 2021-11-14 MED ORDER — VANCOMYCIN HCL 1250 MG/250ML IV SOLN
1250.0000 mg | Freq: Two times a day (BID) | INTRAVENOUS | Status: DC
  Administered 2021-11-15 – 2021-11-18 (×7): 1250 mg via INTRAVENOUS
  Filled 2021-11-14 (×8): qty 250

## 2021-11-14 MED ORDER — PIPERACILLIN-TAZOBACTAM 3.375 G IVPB
3.3750 g | Freq: Three times a day (TID) | INTRAVENOUS | Status: AC
  Administered 2021-11-14 – 2021-11-20 (×20): 3.375 g via INTRAVENOUS
  Filled 2021-11-14 (×20): qty 50

## 2021-11-14 NOTE — Progress Notes (Signed)
Patient placed on trach collar while working with speech. Patient tolerating well on 35%. Per NP, patient placed back on vent after 4 hours. RT will monitor.

## 2021-11-14 NOTE — Progress Notes (Signed)
Occupational Therapy Treatment Patient Details Name: Jeffrey Mullen MRN: [id number] DOB: 01-Jul-1951 Today's Date: 11/14/2021   History of present illness 70 y/o male presented to ED on 10/23/21 after bystanders witnessed patient collapse with associated seizure. Another seizure witnessed in ED. CT head negative. CT C-spine negative. LTM EEG on 11/1 showed 2 seizures from R anterior temporal region. Last seizure noted on EEG on 11/3. Intubated 10/31-11/11. Trach placed 11/11. 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 PMH: alcohol abuse, congenital blindness in L eye, bilateral glaucoma, depression, anxiety, HTN, seizures   OT comments  Pt noted to have extensive break down on buttocks/ sacrum/ inner thigh and base of penis at this time. Pt positioned at end of session onto R side to provide pressure relief after large bowel movement. Pt tolerated EOB sitting >10 minutes with stable respiratory and BP noted to decreased SBP 10 points. Pt requires physical (A) for neck extension with PMV but does verbalized name, DOB and location accurately with increased time >30 seconds. Recommendation for continued OT with increased frequency at this time.    Recommendations for follow up therapy are one component of a multi-disciplinary discharge planning process, led by the attending physician.  Recommendations may be updated based on patient status, additional functional criteria and insurance authorization.    Follow Up Recommendations  OT at Long-term acute care hospital    Assistance Recommended at Discharge Frequent or constant Supervision/Assistance  Equipment Recommendations  Wheelchair (measurements OT);Wheelchair cushion (measurements OT);Hospital bed;Other (comment)    Recommendations for Other Services      Precautions / Restrictions Precautions Precautions: Fall Precaution  Comments: trach, cortrak seizure Restrictions Weight Bearing Restrictions: No       Mobility Bed Mobility Overal bed mobility: Needs Assistance Bed Mobility: Rolling;Supine to Sit;Sit to Supine Rolling: +2 for physical assistance;Total assist   Supine to sit: +2 for physical assistance;Total assist;HOB elevated Sit to supine: Total assist;+2 for physical assistance;HOB elevated   General bed mobility comments: pt dependent on therapist for all mobility. pt noted to have extended abdomen    Transfers                   General transfer comment: no appropriate     Balance Overall balance assessment: Needs assistance Sitting-balance support: Feet supported Sitting balance-Leahy Scale: Zero Sitting balance - Comments: dependent on therapist. pt with posterior resistance this session. question if sacrum discomfort contributing                                   ADL either performed or assessed with clinical judgement   ADL Overall ADL's : Needs assistance/impaired Eating/Feeding: NPO Eating/Feeding Details (indicate cue type and reason): see SLP noted for ice chips during session. Grooming: Oral care;Maximal assistance Grooming Details (indicate cue type and reason): pt opening on command and tolerating oral care but does not like it. Upper Body Bathing: Total assistance   Lower Body Bathing: Total assistance   Upper Body Dressing : Total assistance   Lower Body Dressing: Total assistance     Toilet Transfer Details (indicate cue type and reason): incontinence of bowel and does not verbalize or attempt to communicate           General ADL Comments: pt with very loose muscus stool brown in color. pt noted to have break  down on sacrum, base of penis near rectum and inner thight area    Extremity/Trunk Assessment Upper Extremity Assessment Upper Extremity Assessment: Generalized weakness;LUE deficits/detail;RUE deficits/detail RUE Deficits /  Details: moving spontaneously LUE Deficits / Details: moving hand toward face but unable to lift against gravity and decreased elbow flexion noted   Lower Extremity Assessment Lower Extremity Assessment: Defer to PT evaluation        Vision   Additional Comments: L eye blindness. pt with increased responsiveness in R visual field of R eye   Perception     Praxis      Cognition Arousal/Alertness: Awake/alert Behavior During Therapy: Flat affect Overall Cognitive Status: No family/caregiver present to determine baseline cognitive functioning                                 General Comments: verbalized name, dob delayed >30 seconds Functional Status Assessment: Patient has had a recent decline in their functional status and demonstrates the ability to make significant improvements in function in a reasonable and predictable amount of time.        Exercises Other Exercises Other Exercises: attempting washing face with pt resistance . pt attempting to use L UE over R UE with reaching.   Shoulder Instructions       General Comments trach collar, PMV, Sats 96%, HR 126 RR 24    Pertinent Vitals/ Pain       Pain Assessment: Faces Faces Pain Scale: Hurts whole lot Facial Expression: Grimacing Body Movements: Absence of movements Muscle Tension: Very tense or rigid Compliance with ventilator (intubated pts.): Tolerating ventilator or movement Vocalization (extubated pts.): N/A CPOT Total: 0 Pain Location: pt with pain with hyigene and noted to have break down on buttock Pain Descriptors / Indicators: Discomfort Pain Intervention(s): Monitored during session;Repositioned  Home Living                                          Prior Functioning/Environment              Frequency  Min 2X/week        Progress Toward Goals  OT Goals(current goals can now be found in the care plan section)  Progress towards OT goals: Progressing toward  goals  Acute Rehab OT Goals OT Goal Formulation: Patient unable to participate in goal setting Time For Goal Achievement: 11/28/21 Potential to Achieve Goals: Fair ADL Goals Additional ADL Goal #1: pt will follow 1 step command 75% of session Additional ADL Goal #2: Pt will sustain attention to adl task 50% of session Additional ADL Goal #3: pt will tolerate EOB static sitting max (A) as precursor to adls.  Plan Discharge plan needs to be updated;Frequency needs to be updated    Co-evaluation    PT/OT/SLP Co-Evaluation/Treatment: Yes Reason for Co-Treatment: Complexity of the patient's impairments (multi-system involvement);Necessary to address cognition/behavior during functional activity   OT goals addressed during session: ADL's and self-care;Proper use of Adaptive equipment and DME;Strengthening/ROM SLP goals addressed during session: Swallowing    AM-PAC OT "6 Clicks" Daily Activity     Outcome Measure   Help from another person eating meals?: Total Help from another person taking care of personal grooming?: Total Help from another person toileting, which includes using toliet, bedpan, or urinal?: Total Help from another person bathing (including washing,  rinsing, drying)?: Total Help from another person to put on and taking off regular upper body clothing?: Total Help from another person to put on and taking off regular lower body clothing?: Total 6 Click Score: 6    End of Session Equipment Utilized During Treatment: Oxygen  OT Visit Diagnosis: Unsteadiness on feet (R26.81);Muscle weakness (generalized) (M62.81)   Activity Tolerance Patient tolerated treatment well   Patient Left in bed;with call bell/phone within reach;with bed alarm set;with nursing/sitter in room   Nurse Communication Mobility status;Precautions        Time: 1658-0063 OT Time Calculation (min): 38 min  Charges: OT General Charges $OT Visit: 1 Visit OT Treatments $Self Care/Home  Management : 8-22 mins   Brynn, OTR/L  Acute Rehabilitation Services Pager: [phone] Office: [phone] .   Jeri Modena 11/14/2021, 11:53 AM

## 2021-11-14 NOTE — Evaluation (Signed)
Clinical/Bedside Swallow Evaluation Patient Details  Name: Jeffrey Mullen MRN: [id number] Date of Birth: [birth year]  Today's Date: 11/14/2021 Time: SLP Start Time (ACUTE ONLY): 1115 SLP Stop Time (ACUTE ONLY): 1125 SLP Time Calculation (min) (ACUTE ONLY): 10 min  Past Medical History:  Past Medical History:  Diagnosis Date   Alcohol abuse    Allergy    Anemia    Anxiety    Arthritis    Blind left eye    Cataract, right eye    Congenital blindness    LEFT EYE   Depression    Glaucoma, bilateral    Hepatic cirrhosis ([place])    Hepatitis    HEP C treated in 2016   HTN (hypertension)    Seizures ([place])    3 years ago   Past Surgical History:  Past Surgical History:  Procedure Laterality Date   CATARACT EXTRACTION Left    GLAUCOMA SURGERY     Patient reports 10 or eleven surgeries for Glaucoma   HPI:  70 y/o male presented to ED on 10/23/21 after bystanders witnessed patient collapse with associated seizure. Another seizure witnessed in ED. CT head negative. CT C-spine negative. LTM EEG on 11/1 showed 2 seizures from R anterior temporal region. Last seizure noted on EEG on 11/3. Intubated 10/31-11/11. Trach placed 11/11. 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 PMH: alcohol abuse, congenital blindness in L eye, bilateral glaucoma, depression, anxiety, HTN, seizures    Assessment / Plan / Recommendation  Clinical Impression  Pt on trach collar with PMV seen for a bedside swallow evaluation. Pt cotreated with PT and OT, upright at EOB for duration of the session. Pt alertness and responsiveness continues to improve. Oral mechanism exam revealed moderate generalized oral weakness. SLP completed oral care and trialed ice chips and teaspoons of thin liquid. Pt exhibited oral manipulation with both consistencies however did not initiate swallow throughout trials,  resulting in oral holding and eventual removal with suction. SLP attempted swallow initiation elicitation by asking pt to throat clear before swallow with slight improvement. Recommend NPO, administering medications via alternative means. SLP will continue to folow pt for PO trials as appropriate. SLP Visit Diagnosis: Dysphagia, unspecified (R13.10)    Aspiration Risk  Severe aspiration risk    Diet Recommendation NPO   Medication Administration: Via alternative means    Other  Recommendations Oral Care Recommendations: Oral care QID    Recommendations for follow up therapy are one component of a multi-disciplinary discharge planning process, led by the attending physician.  Recommendations may be updated based on patient status, additional functional criteria and insurance authorization.  Follow up Recommendations Skilled nursing-short term rehab (<3 hours/day)      Assistance Recommended at Discharge Other (comment) (tbd)  Functional Status Assessment Patient has had a recent decline in their functional status and demonstrates the ability to make significant improvements in function in a reasonable and predictable amount of time.  Frequency and Duration min 2x/week  2 weeks       Prognosis Prognosis for Safe Diet Advancement: Good Barriers to Reach Goals: Severity of deficits      Swallow Study   General Date of Onset: 10/23/21 HPI: 70 y/o male presented to ED on 10/23/21 after bystanders witnessed patient collapse with associated seizure. Another seizure witnessed in ED. CT head negative. CT C-spine negative. LTM EEG on 11/1 showed 2 seizures from R anterior temporal  region. Last seizure noted on EEG on 11/3. Intubated 10/31-11/11. Trach placed 11/11. 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 PMH: alcohol abuse, congenital blindness in L eye, bilateral glaucoma,  depression, anxiety, HTN, seizures Type of Study: Bedside Swallow Evaluation Previous Swallow Assessment: n/a Diet Prior to this Study: NPO Temperature Spikes Noted: No Respiratory Status: Trach;Other (comment) (pressure support) History of Recent Intubation: Yes Length of Intubations (days): 12 days Date extubated: 11/03/21 Behavior/Cognition: Alert;Cooperative;Lethargic/Drowsy;Requires cueing Oral Cavity Assessment: Dry Oral Care Completed by SLP: Yes Oral Cavity - Dentition: Poor condition;Missing dentition Vision: Functional for self-feeding Self-Feeding Abilities: Total assist Patient Positioning: Upright in bed (EOB with PT/OT) Baseline Vocal Quality: Low vocal intensity;Hoarse;Breathy Volitional Cough: Weak Volitional Swallow: Unable to elicit    Oral/Motor/Sensory Function Overall Oral Motor/Sensory Function: Generalized oral weakness   Ice Chips Ice chips: Impaired Presentation: Spoon Oral Phase Impairments: Reduced labial seal;Reduced lingual movement/coordination;Impaired mastication Oral Phase Functional Implications: Prolonged oral transit;Oral holding   Thin Liquid Thin Liquid: Impaired Presentation: Spoon Oral Phase Impairments: Reduced labial seal;Reduced lingual movement/coordination Oral Phase Functional Implications: Oral holding;Prolonged oral transit    Nectar Thick Nectar Thick Liquid: Not tested   Honey Thick Honey Thick Liquid: Not tested   Puree Puree: Not tested   Solid     Solid: Not tested     [name], SLP-Student  [name] 11/14/2021,1:29 PM

## 2021-11-14 NOTE — Progress Notes (Signed)
Pharmacy Antibiotic Note  Jeffrey Mullen is a 70 y.o. 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  Patient has been treated for MSSA PNA and he developed new fevers up to 101.3 despite ancef of unclear origin. Pharmacy has been consulted for vancomycin and Zosyn dosing.  Scr has been stable. Scr of 0.86 used for calculations.   Plan: Vancomycin 1750 mg IV x 1 then vancomycin 1250 mg IV q12h (Calculated AUC 488.6) Zosyn 3.375g IV q8h (4 hour infusion). Continue to monitor fever curve, WBC, count, cultures, and clinical course F/u stop date for antibiotics  Height: _0  (177.8 cm) Weight: 83 kg (182 lb 15.7 oz) IBW/kg (Calculated) : 73  Temp (24hrs), Avg:100.4 F (38 C), Min:99.9 F (37.7 C), Max:101.3 F (38.5 C)  Recent Labs  Lab 11/09/21 0458 11/10/21 0720 11/10/21 1619 11/11/21 0416 11/12/21 0557 11/13/21 0457 11/13/21 0757 11/14/21 0331  WBC 10.1 14.1*  --  11.5*  --   --  7.6 9.0  CREATININE 1.00 1.05 1.07 0.84 0.99 0.89  --  0.86    Estimated Creatinine Clearance: 86.1 mL/min (by C-G formula based on SCr of 0.86 mg/dL).    No Active Allergies  Antimicrobials this admission: 11/1 acyclovir >>11/5 11/4 ceftriaxone >>11/10; 11/11>>11/13 11/4 azithro >>11/7 11/8 Unasyn >>11/12 Zosyn 11/17 >> 11/19, 11/22>> Ancef 11/19 >> 11/22 Vancomycin 11/22>>   Microbiology results: 11/2 CSF culture - neg HSV PCR- negative 11/4 TA - abundant H.flu, beta lactamase neg 11/8 TA - few GPC 11/8 BCx - neg 11/9 BAL - negative 11/13 BCx - negative 11/13 TA - negative 11/16 TA - MSSA 11/16 BCx - 1/2 GPC (BCID MRSE), likely a contaminant  11/17 MRSA PCR - negative 11/22 Rcx -   Thank you for allowing pharmacy to be a part of this patient's care.  Jeffrey Mullen, PharmD PGY2 Cardiology Pharmacy Resident 11/14/2021  11:15 AM  Please check [email or web address] for unit-specific pharmacy phone numbers.  Jeffrey Mullen 11/14/2021 10:59 AM

## 2021-11-14 NOTE — TOC Progression Note (Signed)
Transition of Care [facility]) - Progression Note    Patient Details  Name: Jeffrey Mullen MRN: [id number] Date of Birth: 05-Jul-1951  [facility] CM/SW [place], LCSW Phone Number: 11/14/2021, 3:19 PM  Clinical Narrative:    CSW received return call from [place], Chauncy Lean [phone]) and answered questions regarding case. She will request medical records from the hospital and proceed with scheduling court date for Guardianship if [name] is not willing to make decisions.     Expected Discharge Plan: Long Term Acute Care (LTAC) Barriers to Discharge: Continued Medical Work up  Expected Discharge Plan and Services Expected Discharge Plan: [place] (LTAC)   Discharge Planning Services: CM Consult   Living arrangements for the past 2 months: Apartment                                       Social Determinants of Health (SDOH) Interventions    Readmission Risk Interventions No flowsheet data found.

## 2021-11-14 NOTE — Consult Note (Signed)
[place] Nurse wound follow up Refer to previous consult notes on 11/14; reassessment of several pressure injuries.  Wound type: Sacrum/bilat buttocks/inner gluteal fold with 30% yellow slough, Unstageable pressure injury, 70% red moist Stage 3 pressure injury.  Mod amt tan drainage, 5X4X.2cm Left elbow with pink dry Stage 2 pressure injury; .5X.5X.1cm Right heel with pink dry Stage 2 pressure injury; .5X.5X.1cm Left heel with 2 areas of dark purple red deep tissue pressure injuries; 4X4cm and 3X3cm Dressing procedure/placement/frequency: Foam dressings to Stage 2 pressure injuries to protect and promote healing.  Pt is on a low-airloss mattress to reduce pressure. Topical treatment orders provided for bedside nurses as follows to assist with enzymatic debridement; Apply Santyl to buttocks/sacrum Q day and cover with moist gauze dressing, then foam dressing.  (Change foam dressing Q 3 days or PRN soiling.) Float bilat heels to reduce pressure. [place] team will reassess wounds Q week to determine if a change in the plan of care is indicated at that time. Julien Girt MSN, RN, [place], [place], [place]

## 2021-11-14 NOTE — Progress Notes (Signed)
NAME:  Jeffrey Mullen, MRN:  [id number], DOB:  11-15-1951, LOS: 76 ADMISSION DATE:  10/23/2021, CONSULTATION DATE:  10/31 REFERRING MD:  Dr. Dina Rich, CHIEF COMPLAINT:  AMS   History of Present Illness:  70 y/o M who presented to [facility] ER on 10/31 via EMS with reports of altered mental status.  Given  right sided gaze, altered and left sided weakness, code stoke called. The patient was taken emergently to CT for imaging which was negative for LVO.  While in the [place], he had a witnessed seizure.    PCCM called for ICU admission.   Chart review shows he was seen in the ER on 10/4 after passing out at the [employer].  Physical exam was negative at the time.  Labs negative.  No further imaging obtained.   Pertinent  Medical History  Alcohol Use - unclear quantity Anxiety / Depression  Congenital blindness of left eye, cataract on R Hepatitis C+, treated, better in 2016 Seizures   Significant Hospital Events: Including procedures, antibiotic start and stop dates in addition to other pertinent events   10/31 Admit with AMS, seizure, hypertensive emergency, AKI 11/1 MRI brain no acute intracranial process. No etiology is seen for the patient's seizure. 11/02 bedside LP attempted but unsuccessful  11/03  No seizures seen on EEG overnight  11/04 weaning sedation to assess for seizure reoccurrence  11/9 bronchoscopy with noted thick secretions in LLL 11/10 Left PICC placed 11/11 Trach,  Cor Track placed, New Embolic Strokes per MRI 81/44 Post Trach Day 1, some oozing from site, Echo results noted below 11/13 Low grade fever, tachy, LFT's remain elevated with ALT and Alk Phos continuing to rise.  S/p lasix  11/14 weaned PSV 10/5, no changes, foley removed 11/15 on ATC, TEE attempted- unable to pass probe, briefly on Neo, cortrak removed fort TEE, tmax 102.3 11/17 afebrile, NG on cultures, continues to have copious secretions 11/20 fever trend is down, on Ancef 11/22: only tolerating  PSV trials for 3 hours a day   Last Blood Cx 11/8 Neg Last sputum Cx ( BAL) >> 11/9 >> No growth Tracheal Aspirate 11/8>> Normal Flora Trach asp 11/13 > ngtd 11/13 BC x1 >> ngtd 11/16 trach asp >> GPC and GNR; staph Aureus 11/16 Bcx 2 >> ngtd  MRI Brain 11/11 Interval development of multiple scattered acute ischemic infarcts involving the bilateral cerebral hemispheres and left pons, largest measuring 4.9 cm at the right occipital lobe. A central thromboembolic etiology is suspected given the various vascular distributions involved.  Echo 11/11 ECHO reviewed  - Left ventricle: The cavity size was normal. There was moderate    concentric hypertrophy. Systolic function was normal. The    estimated ejection fraction was in the range of 60% to 65%. Wall    motion was normal; there were no regional wall motion    abnormalities. Features are consistent with a pseudonormal left    ventricular filling pattern, with concomitant abnormal relaxation    and increased filling pressure (grade 2 diastolic dysfunction).  - Aortic valve: There was no significant regurgitation.  - Mitral valve: There was no significant regurgitation.  - Left atrium: The atrium was mildly dilated.  - Right ventricle: Systolic function was normal.  - Atrial septum: No defect or patent foramen ovale was identified.  - Tricuspid valve: There was no significant regurgitation.  - Pulmonic valve: There was no significant regurgitation.  - Inferior vena cava: The vessel was normal in size. The  respirophasic diameter changes were in the normal range (>= 50%),    consistent with normal central venous pressure.   RUQ limited US 11/13 > Heterogeneous increased hepatic parenchymal echogenicity typical of steatosis or other intrinsic hepatocellular disease. Nodular hepatic contours suggesting cirrhosis.  Unremarkable sonographic appearance of the gallbladder and biliary tree.  Interim History / Subjective:  Tmax 101;  remains on ancef Remains trached on mechanical ventilation on PSV Did 3 hours PSV trials yesterday and rested on PRVC most of the day Patient following some commands; PERRL; cough gag reflex in tact   Objective   Blood pressure (!) 161/62, pulse (!) 121, temperature (!) 101 F (38.3 C), temperature source Axillary, resp. rate 20, height _0  (1.778 m), weight 83 kg, SpO2 95 %.    Vent Mode: PSV;CPAP FiO2 (%):  [40 %] 40 % Set Rate:  [26 bmp] 26 bmp Vt Set:  [440 mL] 440 mL PEEP:  [5 cmH20] 5 cmH20 Pressure Support:  [10 cmH20-15 cmH20] 10 cmH20 Plateau Pressure:  [15 cmH20-27 cmH20] 15 cmH20   Intake/Output Summary (Last 24 hours) at 11/14/2021 1610 Last data filed at 11/14/2021 0800 Gross per 24 hour  Intake 1869.42 ml  Output 3725 ml  Net -1855.58 ml    Examination: General:   ill appearing trach patient on mech vent HEENT: MM pink/moist; trach in place; L eye cataract present Neuro:  following some commands; PERRL; cough gag reflex in tact CV: s1s2, sinus tachy, no m/r/g PULM:  dim clear BS bilaterally; on mech vent PSV; thick tracheal secretions GI: soft, bsx4 active  Extremities: warm/dry, no edema  Skin: no rashes or lesions appreciated  Labs: Alk phosph 371 (467) AST 94 (219) ALT 87 ( 206) Hgb 7.6  Resolved Hospital Problem list   AKI due to contrast-induced ATN, resolved Nontraumatic rhabdomyolysis, resolved Non-anion Gap Metabolic Acidosis, resolved Assessment & Plan:   Suspected emboic stroke 11/11: TEE difficult and unsuccessful; source unclear Alcohol abuse with prior SDH Status epilepticus-resolved; no seizures on EEG; LP r/o meningitis/encephalitis; MRI negative P: -Neuro following -continue lacosamide and keppra -seizure precautions in place -normotensive BP goal  Sepsis: started zosyn 11/17 -> Ancef 11/19 P: -continue ancef with stop date tomorrow -consider re-culturing and broadening antibiotics if fever/WBC worsen -continue to trend  fever -prn tylenol -check CBC  Acute hypoxemic respiratory failure, on mechanical ventilation Status post tracheostomy 11/11 P: -continue mechanical ventilation 6-8 cc/kg -VAP prevention in place -will hold off on TCT today and continue PSV weaning trials -continue CPT and hypertonic saline nebs TID -albuterol nebs TID  Hypernatremia: improved P: -trend bmp -continue free water 200 mL q8  Hypokalemia P: -trend bmp/mag -replete as needed  Cirrhosis P: -LFTs improving -trend CMP -supportive care  Prediabetes P: -Continue SSI and cbg monitoring  HTN P: -continue clonidine, hydralazine, hydrochlorothiazide -increased amlodipine dose and added carvedilol -normotensive BP goal  Anemia -Continue to trend -transfuse for hgb <7  Goals of care Daughter is still declining to make decisions regarding his goals of care. Palliative following.  Two-physician DNR status will be most appropriate  Appreciate palliative care involvement  Best Practice (right click and "Reselect all SmartList Selections" daily)   Diet/type: tubefeeds- restart after cortrak insertion DVT prophylaxis: prophylactic heparin  GI prophylaxis: PPI Lines: N/A Foley:  condom cath Code Status:  full code Last GOC: 11/18 appreciate palliative care involvement  Critical care time: 35 minutes   JD Rexene Agent [facility] 11/14/2021, 8:37 AM  Please see [email or web address]  for pager details.  From 7A-7P if no response, please call [phone]. After hours, please call ELink [phone].

## 2021-11-14 NOTE — Progress Notes (Signed)
Physical Therapy Treatment Patient Details Name: Jeffrey Mullen MRN: [id number] DOB: April 14, 1951 Today's Date: 11/14/2021   History of Present Illness 69 y/o male presented to ED on 10/23/21 after bystanders witnessed patient collapse with associated seizure. Another seizure witnessed in ED. CT head negative. CT C-spine negative. LTM EEG on 11/1 showed 2 seizures from R anterior temporal region. Last seizure noted on EEG on 11/3. Intubated 10/31-11/11. Trach placed 11/11. 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 PMH: alcohol abuse, congenital blindness in L eye, bilateral glaucoma, depression, anxiety, HTN, seizures    PT Comments     Pt responding consistently but slowly to PT questions and commands today. Pt continuing to require total +2 assist for to/from EOB, but tolerates EOB sitting x15 minutes with posterior truncal support with simultaneous stimulation from OT and SLP. Pt with large loose stool, noted to have skin breakdown on buttocks. PT updated frequency to progress pt mobility as tolerated.    Recommendations for follow up therapy are one component of a multi-disciplinary discharge planning process, led by the attending physician.  Recommendations may be updated based on patient status, additional functional criteria and insurance authorization.  Follow Up Recommendations  PT at Long-term acute care hospital     Assistance Recommended at Discharge Frequent or constant Supervision/Assistance  Equipment Recommendations  Other (comment) (TBD)    Recommendations for Other Services       Precautions / Restrictions Precautions Precautions: Fall Precaution Comments: trach, cortrak (tube feeds paused during session), seizure Restrictions Weight Bearing Restrictions: No     Mobility  Bed Mobility Overal bed mobility: Needs Assistance Bed Mobility:  Rolling;Supine to Sit;Sit to Supine Rolling: +2 for physical assistance;Total assist   Supine to sit: +2 for physical assistance;Total assist;HOB elevated Sit to supine: Total assist;+2 for physical assistance;HOB elevated   General bed mobility comments: pt dependent on therapist for all mobility. pt noted to have distended abdomen, + loose stool during session    Transfers                   General transfer comment: not appropriate    Ambulation/Gait                   Stairs             Wheelchair Mobility    Modified Rankin (Stroke Patients Only) Modified Rankin (Stroke Patients Only) Pre-Morbid Rankin Score: No symptoms Modified Rankin: Severe disability     Balance Overall balance assessment: Needs assistance Sitting-balance support: Feet supported Sitting balance-Leahy Scale: Zero Sitting balance - Comments: dependent on therapist. pt with posterior resistance, especially with fatigue and oral care. EOB sitting x15 minutes with max multimodal cuing and facilitation for lifting head and retracting shoulders Postural control: Posterior lean                                  Cognition Arousal/Alertness: Awake/alert Behavior During Therapy: Flat affect Overall Cognitive Status: No family/caregiver present to determine baseline cognitive functioning                                 General Comments: verbalized name, dob delayed >30 seconds Functional Status Assessment: Patient has had a recent decline in their functional status and demonstrates the  ability to make significant improvements in function in a reasonable and predictable amount of time.      Exercises Other Exercises Other Exercises: attempting washing face with pt resistance . pt attempting to use L UE over R UE with reaching.    General Comments General comments (skin integrity, edema, etc.): transitioned from CPAP setting to trach collar with PMV (SLP  present), SpO2 94-99% on 10L via trach collar. HRmax 120s bpm      Pertinent Vitals/Pain Pain Assessment: Faces Faces Pain Scale: Hurts even more Facial Expression: Relaxed, neutral Body Movements: Absence of movements Muscle Tension: Relaxed Compliance with ventilator (intubated pts.): Coughing but tolerating Vocalization (extubated pts.): N/A CPOT Total: 1 Pain Location: wincing during pericare, LE movement Pain Descriptors / Indicators: Discomfort;Grimacing;Guarding Pain Intervention(s): Limited activity within patient's tolerance;Monitored during session    Home Living                          Prior Function            PT Goals (current goals can now be found in the care plan section) Acute Rehab PT Goals Patient Stated Goal: unable to state PT Goal Formulation: With patient Time For Goal Achievement: 11/18/21 Potential to Achieve Goals: Fair Progress towards PT goals: Progressing toward goals    Frequency    Min 3X/week      PT Plan Current plan remains appropriate    Co-evaluation   Reason for Co-Treatment: Complexity of the patient's impairments (multi-system involvement);Necessary to address cognition/behavior during functional activity   OT goals addressed during session: ADL's and self-care;Proper use of Adaptive equipment and DME;Strengthening/ROM SLP goals addressed during session: Swallowing    AM-PAC PT "6 Clicks" Mobility   Outcome Measure  Help needed turning from your back to your side while in a flat bed without using bedrails?: Total Help needed moving from lying on your back to sitting on the side of a flat bed without using bedrails?: Total Help needed moving to and from a bed to a chair (including a wheelchair)?: Total Help needed standing up from a chair using your arms (e.g., wheelchair or bedside chair)?: Total Help needed to walk in hospital room?: Total Help needed climbing 3-5 steps with a railing? : Total 6 Click Score:  6    End of Session Equipment Utilized During Treatment: Oxygen Activity Tolerance: Patient limited by lethargy Patient left: in bed;with call bell/phone within reach;with bed alarm set Nurse Communication: Mobility status PT Visit Diagnosis: Muscle weakness (generalized) (M62.81);Difficulty in walking, not elsewhere classified (R26.2);Other symptoms and signs involving the nervous system (R29.898)     Time: 8676-1950 PT Time Calculation (min) (ACUTE ONLY): 41 min  Charges:  $Therapeutic Activity: 8-22 mins $Neuromuscular Re-education: 8-22 mins                    Stacie Glaze, PT DPT Acute Rehabilitation Services Pager [phone]  Office [phone]    Chester E Ruffin Pyo 11/14/2021, 1:05 PM

## 2021-11-14 NOTE — Progress Notes (Signed)
Speech Language Pathology Treatment: Cognitive-Linquistic;Passy Muir Speaking valve  Patient Details Name: KOLLYN LINGAFELTER MRN: [id number] DOB: Jul 31, 1951 Today's Date: 11/14/2021 Time: 5498-2641 SLP Time Calculation (min) (ACUTE ONLY): 20 min  Assessment / Plan / Recommendation Clinical Impression   Pt was seen for therapy targeting PMV tolerance and cognitive goals. Pt cotreated with PT and OT, upright at EOB for duration of the session; alertness and responsiveness continues to improve. Pt on trach with CPAP pressure support, however nurse discontinued pressure support for duration of session. SLP deflated cuff and donned PMV speaking valve noting no drop in sats (HR 122, SpO2 95, RR 22) throughout session. Pt exhibited improving phonation however vocal intensity still reduced, benefiting from cues to repeat and increase volume. Pt oriented to person, month, and place when given two options but disoriented to situation, requiring reeducation of condition. Pt improving in simple one-step command following and communicating via facial gestures with therapists. Pt tolerated speaking valve for ~20 minutes. Recommend pt wear PMV with full supervision and SLP only. SLP will continue to follow patient for therapy targeting cognition and aphonia.    HPI HPI: 70 y/o male presented to ED on 10/23/21 after bystanders witnessed patient collapse with associated seizure. Another seizure witnessed in ED. CT head negative. CT C-spine negative. LTM EEG on 11/1 showed 2 seizures from R anterior temporal region. Last seizure noted on EEG on 11/3. Intubated 10/31-11/11. Trach placed 11/11. 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 PMH: alcohol abuse, congenital blindness in L eye, bilateral glaucoma, depression, anxiety, HTN, seizures      SLP Plan  Continue with current plan of care       Recommendations for follow up therapy are one component of a multi-disciplinary discharge planning process, led by the attending physician.  Recommendations may be updated based on patient status, additional functional criteria and insurance authorization.    Recommendations  Medication Administration: Via alternative means      Patient may use Passy-Muir Speech Valve: with SLP only PMSV Supervision: Full MD: Please consider changing trach tube to : Cuffless         General recommendations: Rehab consult Oral Care Recommendations: Oral care QID Follow Up Recommendations: Acute inpatient rehab (3hours/day) Assistance recommended at discharge: PRN SLP Visit Diagnosis: Dysarthria and anarthria (R47.1);Cognitive communication deficit [phone]) Plan: Continue with current plan of care       GO              [name], SLP-Student   [name]  11/14/2021, 1:38 PM

## 2021-11-15 DIAGNOSIS — I6310 Cerebral infarction due to embolism of unspecified precerebral artery: Secondary | ICD-10-CM

## 2021-11-15 DIAGNOSIS — Z9911 Dependence on respirator [ventilator] status: Secondary | ICD-10-CM | POA: Diagnosis not present

## 2021-11-15 DIAGNOSIS — J9601 Acute respiratory failure with hypoxia: Secondary | ICD-10-CM | POA: Diagnosis not present

## 2021-11-15 DIAGNOSIS — R569 Unspecified convulsions: Secondary | ICD-10-CM | POA: Diagnosis not present

## 2021-11-15 LAB — RENAL FUNCTION PANEL
Albumin: 2 g/dL — ABNORMAL LOW (ref 3.5–5.0)
Anion gap: 8 (ref 5–15)
BUN: 28 mg/dL — ABNORMAL HIGH (ref 8–23)
CO2: 27 mmol/L (ref 22–32)
Calcium: 9.1 mg/dL (ref 8.9–10.3)
Chloride: 112 mmol/L — ABNORMAL HIGH (ref 98–111)
Creatinine, Ser: 0.9 mg/dL (ref 0.61–1.24)
GFR, Estimated: >60 mL/min (ref >60)
Glucose, Bld: 151 mg/dL — ABNORMAL HIGH (ref 70–99)
Phosphorus: 3.5 mg/dL (ref 2.5–4.6)
Potassium: 3.7 mmol/L (ref 3.5–5.1)
Sodium: 147 mmol/L — ABNORMAL HIGH (ref 135–145)

## 2021-11-15 LAB — GLUCOSE, CAPILLARY
Glucose-Capillary: 155 mg/dL — ABNORMAL HIGH (ref 70–99)
Glucose-Capillary: 137 mg/dL — ABNORMAL HIGH (ref 70–99)
Glucose-Capillary: 158 mg/dL — ABNORMAL HIGH (ref 70–99)
Glucose-Capillary: 143 mg/dL — ABNORMAL HIGH (ref 70–99)
Glucose-Capillary: 147 mg/dL — ABNORMAL HIGH (ref 70–99)
Glucose-Capillary: 152 mg/dL — ABNORMAL HIGH (ref 70–99)

## 2021-11-15 LAB — CBC
HCT: 25.4 % — ABNORMAL LOW (ref 39.0–52.0)
Hemoglobin: 8.1 g/dL — ABNORMAL LOW (ref 13.0–17.0)
MCH: 27.9 pg (ref 26.0–34.0)
MCHC: 31.9 g/dL (ref 30.0–36.0)
MCV: 87.6 fL (ref 80.0–100.0)
Platelets: 443 10*3/uL — ABNORMAL HIGH (ref 150–400)
RBC: 2.9 MIL/uL — ABNORMAL LOW (ref 4.22–5.81)
RDW: 17.7 % — ABNORMAL HIGH (ref 11.5–15.5)
WBC: 10.8 10*3/uL — ABNORMAL HIGH (ref 4.0–10.5)
nRBC: 0 % (ref 0.0–0.2)

## 2021-11-15 LAB — MRSA NEXT GEN BY PCR, NASAL: MRSA by PCR Next Gen: NOT DETECTED

## 2021-11-15 MED ORDER — POTASSIUM CHLORIDE 20 MEQ PO PACK
40.0000 meq | PACK | Freq: Once | ORAL | Status: AC
  Administered 2021-11-15: 40 meq
  Filled 2021-11-15: qty 2

## 2021-11-15 MED ORDER — CARVEDILOL 12.5 MG PO TABS
12.5000 mg | ORAL_TABLET | Freq: Two times a day (BID) | ORAL | Status: DC
Start: 2021-11-15 — End: 2021-12-05
  Administered 2021-11-15 – 2021-12-05 (×40): 12.5 mg
  Filled 2021-11-15 (×40): qty 1

## 2021-11-15 MED ORDER — FREE WATER
300.0000 mL | Freq: Three times a day (TID) | Status: DC
  Administered 2021-11-15 – 2021-11-19 (×12): 300 mL

## 2021-11-15 MED ORDER — SODIUM CHLORIDE 3 % IN NEBU
INHALATION_SOLUTION | RESPIRATORY_TRACT | Status: AC
  Administered 2021-11-15: 4 mL via RESPIRATORY_TRACT
  Filled 2021-11-15: qty 4

## 2021-11-15 NOTE — Progress Notes (Addendum)
NAME:  Jeffrey Mullen, MRN:  [id number], DOB:  18-Nov-1951, LOS: 55 ADMISSION DATE:  10/23/2021, CONSULTATION DATE:  10/31 REFERRING MD:  Dr. Dina Rich, CHIEF COMPLAINT:  AMS   History of Present Illness:  70 y/o M who presented to [facility] ER on 10/31 via EMS with reports of altered mental status.  Given  right sided gaze, altered and left sided weakness, code stoke called. The patient was taken emergently to CT for imaging which was negative for LVO.  While in the [place], he had a witnessed seizure.    PCCM called for ICU admission.   Chart review shows he was seen in the ER on 10/4 after passing out at the [employer].  Physical exam was negative at the time.  Labs negative.  No further imaging obtained.   Pertinent  Medical History  Alcohol Use - unclear quantity Anxiety / Depression  Congenital blindness of left eye, cataract on R Hepatitis C+, treated, better in 2016 Seizures   Significant Hospital Events: Including procedures, antibiotic start and stop dates in addition to other pertinent events   10/31 Admit with AMS, seizure, hypertensive emergency, AKI 11/1 MRI brain no acute intracranial process. No etiology is seen for the patient's seizure. 11/02 bedside LP attempted but unsuccessful  11/03  No seizures seen on EEG overnight  11/04 weaning sedation to assess for seizure reoccurrence  11/9 bronchoscopy with noted thick secretions in LLL 11/10 Left PICC placed 11/11 Trach,  Cor Track placed, New Embolic Strokes per MRI 74/08 Post Trach Day 1, some oozing from site, Echo results noted below 11/13 Low grade fever, tachy, LFT's remain elevated with ALT and Alk Phos continuing to rise.  S/p lasix  11/14 weaned PSV 10/5, no changes, foley removed 11/15 on ATC, TEE attempted- unable to pass probe, briefly on Neo, cortrak removed fort TEE, tmax 102.3 11/17 afebrile, NG on cultures, continues to have copious secretions 11/20 fever trend is down, on Ancef 11/22: only tolerating  PSV trials for 3 hours a day; broadened antibiotics to zosyn/vanc due to fever spike; recultured   Last Blood Cx 11/8 Neg Last sputum Cx ( BAL) >> 11/9 >> No growth Tracheal Aspirate 11/8>> Normal Flora Trach asp 11/13 > ngtd 11/13 BC x1 >> ngtd 11/16 trach asp >> GPC and GNR; staph Aureus 11/16 Bcx 2 >> ngtd 11/22-trach culture>>pending 11/22-MRSA PCR>>pending  MRI Brain 11/11 Interval development of multiple scattered acute ischemic infarcts involving the bilateral cerebral hemispheres and left pons, largest measuring 4.9 cm at the right occipital lobe. A central thromboembolic etiology is suspected given the various vascular distributions involved.  Echo 11/11 ECHO reviewed  - Left ventricle: The cavity size was normal. There was moderate    concentric hypertrophy. Systolic function was normal. The    estimated ejection fraction was in the range of 60% to 65%. Wall    motion was normal; there were no regional wall motion    abnormalities. Features are consistent with a pseudonormal left    ventricular filling pattern, with concomitant abnormal relaxation    and increased filling pressure (grade 2 diastolic dysfunction).  - Aortic valve: There was no significant regurgitation.  - Mitral valve: There was no significant regurgitation.  - Left atrium: The atrium was mildly dilated.  - Right ventricle: Systolic function was normal.  - Atrial septum: No defect or patent foramen ovale was identified.  - Tricuspid valve: There was no significant regurgitation.  - Pulmonic valve: There was no significant regurgitation.  -  Inferior vena cava: The vessel was normal in size. The    respirophasic diameter changes were in the normal range (>= 50%),    consistent with normal central venous pressure.   RUQ limited US 11/13 > Heterogeneous increased hepatic parenchymal echogenicity typical of steatosis or other intrinsic hepatocellular disease. Nodular hepatic contours suggesting  cirrhosis.  Unremarkable sonographic appearance of the gallbladder and biliary tree.  Interim History / Subjective:  Tmax 100.2 Broadened antibiotics to vanc/zosyn due to fever spike yesterday; trach culture sent Did TCT for a few hours yesterday with PSV trials and rested on PRVC most of the day/night  Objective   Blood pressure (!) 163/75, pulse (!) 103, temperature 100.2 F (37.9 C), temperature source Axillary, resp. rate (!) 28, height _0  (1.778 m), weight 83 kg, SpO2 99 %.    Vent Mode: PRVC FiO2 (%):  [35 %-40 %] 40 % Set Rate:  [26 bmp] 26 bmp Vt Set:  [440 mL] 440 mL PEEP:  [5 cmH20] 5 cmH20 Pressure Support:  [10 cmH20] 10 cmH20 Plateau Pressure:  [15 cmH20-18 cmH20] 18 cmH20   Intake/Output Summary (Last 24 hours) at 11/15/2021 0960 Last data filed at 11/15/2021 0700 Gross per 24 hour  Intake 3212.83 ml  Output 2875 ml  Net 337.83 ml    Examination: General:   ill appearing trach patient on mech vent HEENT: MM pink/moist; trach in place; L eye cataract present Neuro:  following some commands; PERRL; cough gag reflex in tact CV: s1s2, sinus tachy, no m/r/g PULM:  dim clear BS bilaterally; on mech vent on PRVC; thick tracheal secretions present GI: soft, bsx4 active  Extremities: warm/dry, no edema  Skin: no rashes or lesions appreciated  Labs: Na 147 (145) K 3.7 Hgb 8.1   Alk phosph 371 (467) AST 94 (219) ALT 87 ( 206) Hgb 7.6  Resolved Hospital Problem list   AKI due to contrast-induced ATN, resolved Nontraumatic rhabdomyolysis, resolved Non-anion Gap Metabolic Acidosis, resolved Assessment & Plan:   Suspected emboic stroke 11/11: TEE difficult and unsuccessful; source unclear Alcohol abuse with prior SDH Status epilepticus-resolved; no seizures on EEG; LP r/o meningitis/encephalitis; MRI negative P: -Neuro following -continue lacosamide and keppra -seizure precautions in place -normotensive BP goal  Sepsis: started zosyn 11/17 -> Ancef  11/19 -> vanc/zosyn 11/22 P: -broadened antibiotics to zosyn/vanc due to consistent fever -trach culture and MRSA pcr sent -follow cultures -prn tylenol for fever -trend CBC/fever curve  Acute hypoxemic respiratory failure, on mechanical ventilation Status post tracheostomy 11/11 P: -continue mechanical ventilation 6-8 cc/kg -VAP prevention in place -tct and PSV trials as tolerated -rest on PRVC overnight and as needed during the day -pulmonary toiletry: CPT, hypertonic nebs, and albuterol scheduled -PT/OT  -will likely need Ltach facility; TOC following  Hypernatremia:  P: -trend bmp -free water at 200 mL q8; consider increasing  Hypokalemia P: -trend bmp/mag -replete as needed  Cirrhosis: lft improving P: -trend CMP -supportive care  Prediabetes P: -Continue SSI and cbg monitoring  HTN P: -continue clonidine, hydralazine, hydrochlorothiazide, amlodipine -increasing carvedilol dose -normotensive BP goal  Anemia -Continue to trend -transfuse for hgb <7  Goals of care Daughter is still declining to make decisions regarding his goals of care. Palliative following.  Two-physician DNR status will be most appropriate  Appreciate palliative care involvement  11/23: updated [name] over phone  Best Practice (right click and "Reselect all SmartList Selections" daily)   Diet/type: tubefeeds- restart after cortrak insertion DVT prophylaxis: prophylactic heparin  GI prophylaxis: PPI  Lines: N/A Foley:  condom cath Code Status:  full code Last [place]: 11/18 appreciate palliative care involvement  Critical care time: 35 minutes   JD Rexene Agent [place] [facility] 11/15/2021, 7:23 AM  Please see [email or web address] for pager details.  From 7A-7P if no response, please call [phone]. After hours, please call ELink [phone].

## 2021-11-15 NOTE — Progress Notes (Signed)
Speech Language Pathology Treatment: Jeffrey Mullen Speaking valve  Patient Details Name: Jeffrey Mullen MRN: [id number] DOB: Feb 21, 1951 Today's Date: 11/15/2021 Time: 1050-1100 SLP Time Calculation (min) (ACUTE ONLY): 10 min  Assessment / Plan / Recommendation Clinical Impression  Pt seen just after transitioning to ATC. SLP deflated cuff, pt able to tracheally expectorate secretions without difficulty.. Otherwise pt remain lethargic, would only open eyes, attend and follow commands for a second or two and then drift back to sleep. With efforts pt able to say "Hello" and count to three intelligibly. Pts vocal quality clear but low volume. Articulation present. Did not attempt POs given lethargy. Offered them, but pt wouldn't attend and SLP desisted.   HPI HPI: 70 y/o male presented to ED on 10/23/21 after bystanders witnessed patient collapse with associated seizure. Another seizure witnessed in ED. CT head negative. CT C-spine negative. LTM EEG on 11/1 showed 2 seizures from R anterior temporal region. Last seizure noted on EEG on 11/3. Intubated 10/31-11/11. Trach placed 11/11. 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  Recommendations may be updated based on patient status, additional functional criteria and insurance authorization.    Recommendations  Diet recommendations: NPO      Patient may use Passy-Muir Speech Valve: with SLP only PMSV Supervision: Full         Oral Care Recommendations: Oral care QID Follow Up Recommendations: SLP at Long-term acute care  hospital Assistance recommended at discharge: Frequent or constant Supervision/Assistance SLP Visit Diagnosis: Aphonia (R49.1) Plan: Continue with current plan of care       GO               Herbie Baltimore, [place] Office [phone]  Lynann Beaver  11/15/2021, 12:48 PM

## 2021-11-16 ENCOUNTER — Inpatient Hospital Stay (HOSPITAL_COMMUNITY): Payer: Medicare Other

## 2021-11-16 DIAGNOSIS — J9602 Acute respiratory failure with hypercapnia: Secondary | ICD-10-CM | POA: Diagnosis not present

## 2021-11-16 LAB — CULTURE, RESPIRATORY W GRAM STAIN

## 2021-11-16 LAB — CBC
HCT: 26.3 % — ABNORMAL LOW (ref 39.0–52.0)
Hemoglobin: 8.2 g/dL — ABNORMAL LOW (ref 13.0–17.0)
MCH: 27.5 pg (ref 26.0–34.0)
MCHC: 31.2 g/dL (ref 30.0–36.0)
MCV: 88.3 fL (ref 80.0–100.0)
Platelets: 475 10*3/uL — ABNORMAL HIGH (ref 150–400)
RBC: 2.98 MIL/uL — ABNORMAL LOW (ref 4.22–5.81)
RDW: 17.5 % — ABNORMAL HIGH (ref 11.5–15.5)
WBC: 10.9 10*3/uL — ABNORMAL HIGH (ref 4.0–10.5)
nRBC: 0 % (ref 0.0–0.2)

## 2021-11-16 LAB — GLUCOSE, CAPILLARY
Glucose-Capillary: 143 mg/dL — ABNORMAL HIGH (ref 70–99)
Glucose-Capillary: 152 mg/dL — ABNORMAL HIGH (ref 70–99)
Glucose-Capillary: 156 mg/dL — ABNORMAL HIGH (ref 70–99)
Glucose-Capillary: 153 mg/dL — ABNORMAL HIGH (ref 70–99)
Glucose-Capillary: 147 mg/dL — ABNORMAL HIGH (ref 70–99)

## 2021-11-16 LAB — RENAL FUNCTION PANEL
Albumin: 2.1 g/dL — ABNORMAL LOW (ref 3.5–5.0)
Anion gap: 8 (ref 5–15)
BUN: 32 mg/dL — ABNORMAL HIGH (ref 8–23)
CO2: 26 mmol/L (ref 22–32)
Calcium: 9.2 mg/dL (ref 8.9–10.3)
Chloride: 112 mmol/L — ABNORMAL HIGH (ref 98–111)
Creatinine, Ser: 0.94 mg/dL (ref 0.61–1.24)
GFR, Estimated: >60 mL/min (ref >60)
Glucose, Bld: 149 mg/dL — ABNORMAL HIGH (ref 70–99)
Phosphorus: 3.6 mg/dL (ref 2.5–4.6)
Potassium: 3.5 mmol/L (ref 3.5–5.1)
Sodium: 146 mmol/L — ABNORMAL HIGH (ref 135–145)

## 2021-11-16 MED ORDER — POTASSIUM CHLORIDE 20 MEQ PO PACK
40.0000 meq | PACK | Freq: Once | ORAL | Status: AC
  Administered 2021-11-16: 40 meq
  Filled 2021-11-16: qty 2

## 2021-11-16 NOTE — Progress Notes (Addendum)
NAME:  BONNIE ROIG, MRN:  [id number], DOB:  12-06-1951, LOS: 24 ADMISSION DATE:  10/23/2021, CONSULTATION DATE:  10/31 REFERRING MD:  Dr. Dina Rich, CHIEF COMPLAINT:  AMS   History of Present Illness:  70 y/o M who presented to [facility] ER on 10/31 via EMS with reports of altered mental status.  Given  right sided gaze, altered and left sided weakness, code stoke called. The patient was taken emergently to CT for imaging which was negative for LVO.  While in the [place], he had a witnessed seizure.    PCCM called for ICU admission.   Chart review shows he was seen in the ER on 10/4 after passing out at the [employer].  Physical exam was negative at the time.  Labs negative.  No further imaging obtained.   Pertinent  Medical History  Alcohol Use - unclear quantity Anxiety / Depression  Congenital blindness of left eye, cataract on R Hepatitis C+, treated, better in 2016 Seizures   Significant Hospital Events: Including procedures, antibiotic start and stop dates in addition to other pertinent events   10/31 Admit with AMS, seizure, hypertensive emergency, AKI 11/1 MRI brain no acute intracranial process. No etiology is seen for the patient's seizure. 11/02 bedside LP attempted but unsuccessful  11/03  No seizures seen on EEG overnight  11/04 weaning sedation to assess for seizure reoccurrence  11/9 bronchoscopy with noted thick secretions in LLL 11/10 Left PICC placed 11/11 Trach,  Cor Track placed, New Embolic Strokes per MRI 96/22 Post Trach Day 1, some oozing from site, Echo results noted below 11/13 Low grade fever, tachy, LFT's remain elevated with ALT and Alk Phos continuing to rise.  S/p lasix  11/14 weaned PSV 10/5, no changes, foley removed 11/15 on ATC, TEE attempted- unable to pass probe, briefly on Neo, cortrak removed fort TEE, tmax 102.3 11/17 afebrile, NG on cultures, continues to have copious secretions 11/20 fever trend is down, on Ancef 11/22: only tolerating  PSV trials for 3 hours a day; broadened antibiotics to zosyn/vanc due to fever spike; recultured   Last Blood Cx 11/8 Neg Last sputum Cx ( BAL) >> 11/9 >> No growth Tracheal Aspirate 11/8>> Normal Flora Trach asp 11/13 > ngtd 11/13 BC x1 >> ngtd 11/16 trach asp >> GPC and GNR; staph Aureus 11/16 Bcx 2 >> ngtd 11/22-trach culture>>MSSA. Resistant to erythro, clinda, 11/22-MRSA PCR>>neg  MRI Brain 11/11 Interval development of multiple scattered acute ischemic infarcts involving the bilateral cerebral hemispheres and left pons, largest measuring 4.9 cm at the right occipital lobe. A central thromboembolic etiology is suspected given the various vascular distributions involved.  Echo 11/11 ECHO reviewed  - Left ventricle: The cavity size was normal. There was moderate    concentric hypertrophy. Systolic function was normal. The    estimated ejection fraction was in the range of 60% to 65%. Wall    motion was normal; there were no regional wall motion    abnormalities. Features are consistent with a pseudonormal left    ventricular filling pattern, with concomitant abnormal relaxation    and increased filling pressure (grade 2 diastolic dysfunction).  - Aortic valve: There was no significant regurgitation.  - Mitral valve: There was no significant regurgitation.  - Left atrium: The atrium was mildly dilated.  - Right ventricle: Systolic function was normal.  - Atrial septum: No defect or patent foramen ovale was identified.  - Tricuspid valve: There was no significant regurgitation.  - Pulmonic valve: There was  no significant regurgitation.  - Inferior vena cava: The vessel was normal in size. The    respirophasic diameter changes were in the normal range (>= 50%),    consistent with normal central venous pressure.   RUQ limited US 11/13 > Heterogeneous increased hepatic parenchymal echogenicity typical of steatosis or other intrinsic hepatocellular disease. Nodular hepatic  contours suggesting cirrhosis.  Unremarkable sonographic appearance of the gallbladder and biliary tree.  Interim History / Subjective:  Tmax 101.2 Tolerated Trach collar >12 hours yesterday. Returned to vent overnight This AM on TCT 1 hour with desats to 85%  Objective   Blood pressure (!) 126/57, pulse 90, temperature 100.1 F (37.8 C), temperature source Axillary, resp. rate (!) 22, height _0  (1.778 m), weight 82.8 kg, SpO2 97 %.    Vent Mode: PRVC FiO2 (%):  [35 %-40 %] 35 % Set Rate:  [26 bmp] 26 bmp Vt Set:  [440 mL] 440 mL PEEP:  [5 cmH20] 5 cmH20 Plateau Pressure:  [15 cmH20-21 cmH20] 15 cmH20   Intake/Output Summary (Last 24 hours) at 11/16/2021 0916 Last data filed at 11/16/2021 0700 Gross per 24 hour  Intake 3414.55 ml  Output 2500 ml  Net 914.55 ml   Physical Exam: General: Chronically ill-appearing, no acute distress HENT: [place], AT, OP clear, MMM Neck: Trach in place, c/d/i Eyes: Left eye blindness, EOMI, no scleral icterus Respiratory: Clear to auscultation bilaterally.  No crackles, wheezing or rales Cardiovascular: RRR, -M/R/G, no JVD GI: BS+, soft, nontender Extremities:-Edema,-tenderness Neuro: Awake and alert, follows commands GU: Foley in place   Na 146 K 3.5 Cr 0.94  WBC 10.9 Hg 8.2 Plt 475  CXR 11/24 trach in place, no infiltrate, unchanged bilateral atelectasis/scarring Imaging, labs and test noted above have been reviewed independently by me.   Resolved Hospital Problem list   AKI due to contrast-induced ATN, resolved Nontraumatic rhabdomyolysis, resolved Non-anion Gap Metabolic Acidosis, resolved Assessment & Plan:   Suspected emboic stroke 11/11: TEE difficult and unsuccessful; source unclear Alcohol abuse with prior SDH Status epilepticus-resolved; no seizures on EEG; LP r/o meningitis/encephalitis; MRI negative P: -Neuro following -continue lacosamide and keppra -seizure precautions in place -normotensive BP goal  Sepsis:  started zosyn 11/17 -> Ancef 11/19 -> vanc/zosyn 11/22 Repeat trach cx with MSSA. Antibiotic treatment has been appropriate however still febrile P: -broadened antibiotics to zosyn/vanc due to consistent fever -Consider ID consult if remains febrile +/- worsening leukocytosis -F/u arm ultrasound for cause of fever -prn tylenol for fever -trend CBC/fever curve  Acute hypoxemic respiratory failure, on mechanical ventilation Status post tracheostomy 11/11 Tolerated TCT >12 hours on 11/23 Return to vent this AM after one hour due to desaturations. Likely overfatigued P: -continue mechanical ventilation 6-8 cc/kg -VAP prevention in place -tct and PSV trials as tolerated.  -rest on PRVC overnight and as needed during the day. May be able to tolerated longer trials this weekend -pulmonary toiletry: CPT, hypertonic nebs, and albuterol scheduled -PT/OT  -will likely need Ltach facility; TOC following  Hypernatremia:  P: -trend bmp -increase free water at 300 mL q8  Hypokalemia P: -trend bmp/mag -replete as needed  Cirrhosis: lft improving P: -trend CMP -supportive care  Prediabetes P: -Continue SSI and cbg monitoring  HTN P: -continue clonidine, hydralazine, hydrochlorothiazide, amlodipine -increasing carvedilol dose -normotensive BP goal  Anemia -Continue to trend -transfuse for hgb <7  Goals of care Daughter is still declining to make decisions regarding his goals of care. Palliative following.  Two-physician DNR status will be most  appropriate  Appreciate palliative care involvement  11/23: updated [name] over phone  Best Practice (right click and "Reselect all SmartList Selections" daily)   Diet/type: tubefeeds- restart after cortrak insertion DVT prophylaxis: prophylactic heparin  GI prophylaxis: PPI Lines: N/A Foley:  condom cath Code Status:  full code Last [place]: 11/18 appreciate palliative care involvement   The patient is critically ill with vent  dependence failure, fever and requires high complexity decision making for assessment and support, frequent evaluation and titration of therapies, application of advanced monitoring technologies and extensive interpretation of multiple databases.  Independent Critical Care Time: 63 Minutes.   Rodman Pickle, M.D. [facility] 11/16/2021 9:17 AM   Please see Amion for pager number to reach on-call Pulmonary and Critical Care Team.

## 2021-11-17 ENCOUNTER — Inpatient Hospital Stay (HOSPITAL_COMMUNITY): Payer: Medicare Other

## 2021-11-17 DIAGNOSIS — Z452 Encounter for adjustment and management of vascular access device: Secondary | ICD-10-CM | POA: Diagnosis not present

## 2021-11-17 DIAGNOSIS — J9602 Acute respiratory failure with hypercapnia: Secondary | ICD-10-CM | POA: Diagnosis not present

## 2021-11-17 DIAGNOSIS — J9601 Acute respiratory failure with hypoxia: Secondary | ICD-10-CM | POA: Diagnosis not present

## 2021-11-17 DIAGNOSIS — G9340 Encephalopathy, unspecified: Secondary | ICD-10-CM | POA: Diagnosis not present

## 2021-11-17 DIAGNOSIS — N179 Acute kidney failure, unspecified: Secondary | ICD-10-CM | POA: Diagnosis not present

## 2021-11-17 LAB — BASIC METABOLIC PANEL
Anion gap: 8 (ref 5–15)
BUN: 32 mg/dL — ABNORMAL HIGH (ref 8–23)
CO2: 24 mmol/L (ref 22–32)
Calcium: 9 mg/dL (ref 8.9–10.3)
Chloride: 115 mmol/L — ABNORMAL HIGH (ref 98–111)
Creatinine, Ser: 0.96 mg/dL (ref 0.61–1.24)
GFR, Estimated: >60 mL/min (ref >60)
Glucose, Bld: 166 mg/dL — ABNORMAL HIGH (ref 70–99)
Potassium: 3.7 mmol/L (ref 3.5–5.1)
Sodium: 147 mmol/L — ABNORMAL HIGH (ref 135–145)

## 2021-11-17 LAB — CBC
HCT: 26.5 % — ABNORMAL LOW (ref 39.0–52.0)
Hemoglobin: 8.5 g/dL — ABNORMAL LOW (ref 13.0–17.0)
MCH: 28.4 pg (ref 26.0–34.0)
MCHC: 32.1 g/dL (ref 30.0–36.0)
MCV: 88.6 fL (ref 80.0–100.0)
Platelets: 454 10*3/uL — ABNORMAL HIGH (ref 150–400)
RBC: 2.99 MIL/uL — ABNORMAL LOW (ref 4.22–5.81)
RDW: 17.6 % — ABNORMAL HIGH (ref 11.5–15.5)
WBC: 10.3 10*3/uL (ref 4.0–10.5)
nRBC: 0 % (ref 0.0–0.2)

## 2021-11-17 LAB — RENAL FUNCTION PANEL
Albumin: 2.2 g/dL — ABNORMAL LOW (ref 3.5–5.0)
Anion gap: 9 (ref 5–15)
BUN: 32 mg/dL — ABNORMAL HIGH (ref 8–23)
CO2: 24 mmol/L (ref 22–32)
Calcium: 9 mg/dL (ref 8.9–10.3)
Chloride: 115 mmol/L — ABNORMAL HIGH (ref 98–111)
Creatinine, Ser: 0.94 mg/dL (ref 0.61–1.24)
GFR, Estimated: >60 mL/min (ref >60)
Glucose, Bld: 165 mg/dL — ABNORMAL HIGH (ref 70–99)
Phosphorus: 4 mg/dL (ref 2.5–4.6)
Potassium: 3.8 mmol/L (ref 3.5–5.1)
Sodium: 148 mmol/L — ABNORMAL HIGH (ref 135–145)

## 2021-11-17 LAB — HEPATIC FUNCTION PANEL
ALT: 119 U/L — ABNORMAL HIGH (ref 0–44)
AST: 122 U/L — ABNORMAL HIGH (ref 15–41)
Albumin: 2.2 g/dL — ABNORMAL LOW (ref 3.5–5.0)
Alkaline Phosphatase: 330 U/L — ABNORMAL HIGH (ref 38–126)
Bilirubin, Direct: 0.1 mg/dL (ref 0.0–0.2)
Indirect Bilirubin: 0.6 mg/dL (ref 0.3–0.9)
Total Bilirubin: 0.7 mg/dL (ref 0.3–1.2)
Total Protein: 7.6 g/dL (ref 6.5–8.1)

## 2021-11-17 LAB — GLUCOSE, CAPILLARY
Glucose-Capillary: 157 mg/dL — ABNORMAL HIGH (ref 70–99)
Glucose-Capillary: 147 mg/dL — ABNORMAL HIGH (ref 70–99)
Glucose-Capillary: 146 mg/dL — ABNORMAL HIGH (ref 70–99)
Glucose-Capillary: 159 mg/dL — ABNORMAL HIGH (ref 70–99)
Glucose-Capillary: 160 mg/dL — ABNORMAL HIGH (ref 70–99)
Glucose-Capillary: 155 mg/dL — ABNORMAL HIGH (ref 70–99)

## 2021-11-17 MED ORDER — SCOPOLAMINE 1 MG/3DAYS TD PT72
1.0000 | MEDICATED_PATCH | TRANSDERMAL | Status: DC
  Administered 2021-11-17 – 2021-12-14 (×10): 1.5 mg via TRANSDERMAL
  Filled 2021-11-17 (×10): qty 1

## 2021-11-17 MED ORDER — POTASSIUM CHLORIDE 20 MEQ PO PACK
40.0000 meq | PACK | Freq: Once | ORAL | Status: AC
Start: 2021-11-17 — End: 2021-11-17
  Administered 2021-11-17: 40 meq
  Filled 2021-11-17: qty 2

## 2021-11-17 MED ORDER — IRBESARTAN 150 MG PO TABS
75.0000 mg | ORAL_TABLET | Freq: Every day | ORAL | Status: DC
  Administered 2021-11-17 – 2021-11-18 (×2): 75 mg via ORAL
  Filled 2021-11-17 (×2): qty 1

## 2021-11-17 NOTE — Progress Notes (Signed)
Upper extremity venous LT study completed.  Preliminary results relayed to RN on the floor.  See CV Proc for preliminary results report.   Darlin Coco, RDMS, RVT

## 2021-11-17 NOTE — Progress Notes (Signed)
 NAME:  Jeffrey Mullen, MRN:  [id number], DOB:  02/17/1951, LOS: 25 ADMISSION DATE:  10/23/2021, CONSULTATION DATE:  10/31 REFERRING MD:  Dr. Horton, CHIEF COMPLAINT:  AMS   History of Present Illness:  70 y/o M who presented to [facility] ER on 10/31 via EMS with reports of altered mental status.  Given  right sided gaze, altered and left sided weakness, code stoke called. The patient was taken emergently to CT for imaging which was negative for LVO.  While in the CT scanner, he had a witnessed seizure.    PCCM called for ICU admission.   Chart review shows he was seen in the ER on 10/4 after passing out [place] [employer].  Physical exam was negative at the time.  Labs negative.  No further imaging obtained.   Pertinent  Medical History  Alcohol Use - unclear quantity Anxiety / Depression  Congenital blindness of left eye, cataract on R Hepatitis C+, treated, better in 2016 Seizures   Significant Hospital Events: Including procedures, antibiotic start and stop dates in addition to other pertinent events   10/31 Admit with AMS, seizure, hypertensive emergency, AKI 11/1 MRI brain no acute intracranial process. No etiology is seen for the patient's seizure. 11/02 bedside LP attempted but unsuccessful  11/03  No seizures seen on EEG overnight  11/04 weaning sedation to assess for seizure reoccurrence  11/9 bronchoscopy with noted thick secretions in LLL 11/10 Left PICC placed 11/11 Trach,  Cor Track placed, New Embolic Strokes per MRI 11/12 Post Trach Day 1, some oozing from site, Echo results noted below 11/13 Low grade fever, tachy, LFT's remain elevated with ALT and Alk Phos continuing to rise.  S/p lasix  11/14 weaned PSV 10/5, no changes, foley removed 11/15 on ATC, TEE attempted- unable to pass probe, briefly on Neo, cortrak removed fort TEE, tmax 102.3 11/17 afebrile, NG on cultures, continues to have copious secretions 11/20 fever trend is down, on Ancef 11/22: only tolerating  PSV trials for 3 hours a day; broadened antibiotics to zosyn/vanc due to fever spike; recultured   Last Blood Cx 11/8 Neg Last sputum Cx ( BAL) >> 11/9 >> No growth Tracheal Aspirate 11/8>> Normal Flora Trach asp 11/13 > ngtd 11/13 BC x1 >> ngtd 11/16 trach asp >> GPC and GNR; staph Aureus 11/16 Bcx 2 >> ngtd 11/22-trach culture>>MSSA. Resistant to erythro, clinda, 11/22-MRSA PCR>>neg  MRI Brain 11/11 Interval development of multiple scattered acute ischemic infarcts involving the bilateral cerebral hemispheres and left pons, largest measuring 4.9 cm at the right occipital lobe. A central thromboembolic etiology is suspected given the various vascular distributions involved.  Echo 11/11 ECHO reviewed  - Left ventricle: The cavity size was normal. There was moderate    concentric hypertrophy. Systolic function was normal. The    estimated ejection fraction was in the range of 60% to 65%. Wall    motion was normal; there were no regional wall motion    abnormalities. Features are consistent with a pseudonormal left    ventricular filling pattern, with concomitant abnormal relaxation    and increased filling pressure (grade 2 diastolic dysfunction).  - Aortic valve: There was no significant regurgitation.  - Mitral valve: There was no significant regurgitation.  - Left atrium: The atrium was mildly dilated.  - Right ventricle: Systolic function was normal.  - Atrial septum: No defect or patent foramen ovale was identified.  - Tricuspid valve: There was no significant regurgitation.  - Pulmonic valve: There was   no significant regurgitation.  - Inferior vena cava: The vessel was normal in size. The    respirophasic diameter changes were in the normal range (>= 50%),    consistent with normal central venous pressure.   RUQ limited US 11/13 > Heterogeneous increased hepatic parenchymal echogenicity typical of steatosis or other intrinsic hepatocellular disease. Nodular hepatic  contours suggesting cirrhosis.  Unremarkable sonographic appearance of the gallbladder and biliary tree.  Interim History / Subjective:  Patient became afebrile after initiation of antibiotics with Vanco and Zosyn Tolerated Trach collar >2 hours only due to desaturation   Objective   Blood pressure (!) 141/66, pulse 91, temperature 98.6 F (37 C), temperature source Oral, resp. rate 14, height 5' 10" (1.778 m), weight 77.3 kg, SpO2 98 %.    Vent Mode: PRVC FiO2 (%):  [35 %-40 %] 35 % Set Rate:  [26 bmp] 26 bmp Vt Set:  [440 mL] 440 mL PEEP:  [5 cmH20] 5 cmH20 Plateau Pressure:  [14 cmH20-16 cmH20] 16 cmH20   Intake/Output Summary (Last 24 hours) at 11/17/2021 1003 Last data filed at 11/17/2021 0900 Gross per 24 hour  Intake 3501.82 ml  Output 3150 ml  Net 351.82 ml   Physical Exam: General: Chronically ill-appearing, s/p trach HENT: [place], AT, OP clear, MMM Neck: Trach in place, c/d/i Eyes: Left eye blindness, EOMI, no scleral icterus Respiratory: Coarse breath sounds all over.  No crackles, wheezing or rales Cardiovascular: RRR, -M/R/G, no JVD GI: BS+, soft, nontender Extremities:-Edema,-tenderness Neuro: Awake and alert, follows commands Skin: No rash   Resolved Hospital Problem list   AKI due to contrast-induced ATN, resolved Nontraumatic rhabdomyolysis, resolved Non-anion Gap Metabolic Acidosis, resolved Assessment & Plan:  Suspected multifocal bilateral emboic stroke 11/11: TEE difficult and unsuccessful; source unclear Alcohol abuse with prior SDH Status epilepticus-resolved; no seizures on EEG; Appreciate neurology input Continue aspirin Continue secondary stroke prophylaxis Continue lacosamide and keppra Seizure precautions in place  Sepsis: started zosyn 11/17 -> Ancef 11/19 -> vanc/zosyn 11/22 Repeat trach cx with MSSA. Since the start of vancomycin and Zosyn patient became afebrile, will complete 7 days therapy F/u arm ultrasound for cause of  fever  Acute hypoxemic respiratory failure, on mechanical ventilation Status post tracheostomy 11/11 Tolerated TCT 2 hours on 11/24 Placed on trach collar this morning, will continue as long as he can tolerate even at night VAP prevention in place Pulmonary toiletry: CPT, hypertonic nebs, and albuterol scheduled PT/OT  Will likely need Ltach facility; TOC following  Hypernatremia:  Continue free water flushes Monitor serum sodium, still remain elevated to 147  Hypokalemia, resolved Monitor and supplement electrolytes  Cirrhosis: lft improving Trend CMP Supportive care  Prediabetes Continue SSI and cbg monitoring  HTN, better controlled now Continue clonidine, hydralazine, hydrochlorothiazide, amlodipine, Coreg  Anemia of critical illness Continue to trend Transfuse for hgb <7  Goals of care Daughter is still declining to make decisions regarding his goals of care. Palliative following.  Two-physician DNR status will be most appropriate  Appreciate palliative care involvement  Best Practice (right click and "Reselect all SmartList Selections" daily)   Diet/type: tubefeeds DVT prophylaxis: prophylactic heparin  GI prophylaxis: PPI Lines: N/A Foley:  condom cath Code Status:  full code Last GOC: 11/23 appreciate palliative care involvement    Total critical care time: 39 minutes  Performed by: [place] care time was exclusive of separately billable procedures and treating other patients.   Critical care was necessary to treat or prevent imminent or life-threatening  deterioration.   Critical care was time spent personally by me on the following activities: development of treatment plan with patient and/or surrogate as well as nursing, discussions with consultants, evaluation of patient's response to treatment, examination of patient, obtaining history from patient or surrogate, ordering and performing treatments and interventions, ordering and  review of laboratory studies, ordering and review of radiographic studies, pulse oximetry and re-evaluation of patient's condition.   Jacky Kindle MD [facility] See Amion for pager If no response to pager, please call [phone] until 7pm After 7pm, Please call E-link [phone]

## 2021-11-17 NOTE — Progress Notes (Signed)
Upper extremity venous attempted. Patient receiving RN care.  Will attempt again as schedule and patient availability permits.   Darlin Coco, RDMS, RVT

## 2021-11-18 DIAGNOSIS — J9601 Acute respiratory failure with hypoxia: Secondary | ICD-10-CM | POA: Diagnosis not present

## 2021-11-18 DIAGNOSIS — J9602 Acute respiratory failure with hypercapnia: Secondary | ICD-10-CM | POA: Diagnosis not present

## 2021-11-18 DIAGNOSIS — N179 Acute kidney failure, unspecified: Secondary | ICD-10-CM | POA: Diagnosis not present

## 2021-11-18 LAB — RENAL FUNCTION PANEL
Albumin: 2.3 g/dL — ABNORMAL LOW (ref 3.5–5.0)
Anion gap: 9 (ref 5–15)
BUN: 30 mg/dL — ABNORMAL HIGH (ref 8–23)
CO2: 24 mmol/L (ref 22–32)
Calcium: 9.4 mg/dL (ref 8.9–10.3)
Chloride: 114 mmol/L — ABNORMAL HIGH (ref 98–111)
Creatinine, Ser: 0.77 mg/dL (ref 0.61–1.24)
GFR, Estimated: >60 mL/min (ref >60)
Glucose, Bld: 137 mg/dL — ABNORMAL HIGH (ref 70–99)
Phosphorus: 3.7 mg/dL (ref 2.5–4.6)
Potassium: 3.5 mmol/L (ref 3.5–5.1)
Sodium: 147 mmol/L — ABNORMAL HIGH (ref 135–145)

## 2021-11-18 LAB — VANCOMYCIN, TROUGH: Vancomycin Tr: 26 ug/mL (ref 15–20)

## 2021-11-18 LAB — GLUCOSE, CAPILLARY
Glucose-Capillary: 105 mg/dL — ABNORMAL HIGH (ref 70–99)
Glucose-Capillary: 160 mg/dL — ABNORMAL HIGH (ref 70–99)
Glucose-Capillary: 161 mg/dL — ABNORMAL HIGH (ref 70–99)
Glucose-Capillary: 156 mg/dL — ABNORMAL HIGH (ref 70–99)

## 2021-11-18 MED ORDER — ORAL CARE MOUTH RINSE
15.0000 mL | Freq: Two times a day (BID) | OROMUCOSAL | Status: DC
  Administered 2021-11-18 – 2021-12-12 (×41): 15 mL via OROMUCOSAL

## 2021-11-18 MED ORDER — ACETAMINOPHEN 160 MG/5ML PO SOLN
650.0000 mg | Freq: Three times a day (TID) | ORAL | Status: DC | PRN
  Administered 2021-11-18 – 2021-12-01 (×9): 650 mg
  Filled 2021-11-18 (×11): qty 20.3

## 2021-11-18 MED ORDER — VANCOMYCIN HCL 750 MG/150ML IV SOLN
750.0000 mg | Freq: Two times a day (BID) | INTRAVENOUS | Status: AC
  Administered 2021-11-19 – 2021-11-20 (×4): 750 mg via INTRAVENOUS
  Filled 2021-11-18 (×4): qty 150

## 2021-11-18 MED ORDER — VITAL 1.5 CAL PO LIQD
1000.0000 mL | ORAL | Status: DC
  Administered 2021-11-18 – 2021-11-23 (×8): 1000 mL
  Filled 2021-11-18 (×7): qty 1000

## 2021-11-18 MED ORDER — FENTANYL CITRATE PF 50 MCG/ML IJ SOSY
25.0000 ug | PREFILLED_SYRINGE | INTRAMUSCULAR | Status: DC | PRN
  Administered 2021-11-18 – 2021-12-04 (×5): 25 ug via INTRAVENOUS
  Filled 2021-11-18 (×7): qty 1

## 2021-11-18 MED ORDER — PROSOURCE TF PO LIQD
45.0000 mL | Freq: Three times a day (TID) | ORAL | Status: DC
  Administered 2021-11-18 – 2021-11-23 (×14): 45 mL
  Filled 2021-11-18 (×10): qty 45

## 2021-11-18 MED ORDER — IRBESARTAN 150 MG PO TABS
75.0000 mg | ORAL_TABLET | Freq: Every day | ORAL | Status: DC
Start: 2021-11-19 — End: 2021-11-27
  Administered 2021-11-19 – 2021-11-27 (×9): 75 mg
  Filled 2021-11-18 (×9): qty 1

## 2021-11-18 MED ORDER — CHLORHEXIDINE GLUCONATE 0.12 % MT SOLN
15.0000 mL | Freq: Two times a day (BID) | OROMUCOSAL | Status: DC
  Administered 2021-11-18 – 2021-12-14 (×48): 15 mL via OROMUCOSAL
  Filled 2021-11-18 (×50): qty 15

## 2021-11-18 MED ORDER — LOPERAMIDE HCL 1 MG/7.5ML PO SUSP
2.0000 mg | ORAL | Status: DC | PRN
  Administered 2021-11-18: 2 mg
  Filled 2021-11-18: qty 15
  Filled 2021-11-18: qty 30
  Filled 2021-11-18 (×4): qty 15

## 2021-11-18 NOTE — Progress Notes (Signed)
NAME:  Jeffrey Mullen, MRN:  [id number], DOB:  1951/11/14, LOS: 4 ADMISSION DATE:  10/23/2021, CONSULTATION DATE:  10/31 REFERRING MD:  Dr. Dina Rich, CHIEF COMPLAINT:  AMS   History of Present Illness:  70 y/o M who presented to [facility] ER on 10/31 via EMS with reports of altered mental status.  Given  right sided gaze, altered and left sided weakness, code stoke called. The patient was taken emergently to CT for imaging which was negative for LVO.  While in the [place], he had a witnessed seizure.    PCCM called for ICU admission.   Chart review shows he was seen in the ER on 10/4 after passing out at the [employer].  Physical exam was negative at the time.  Labs negative.  No further imaging obtained.   Pertinent  Medical History  Alcohol Use - unclear quantity Anxiety / Depression  Congenital blindness of left eye, cataract on R Hepatitis C+, treated, better in 2016 Seizures   Significant Hospital Events: Including procedures, antibiotic start and stop dates in addition to other pertinent events   10/31 Admit with AMS, seizure, hypertensive emergency, AKI 11/1 MRI brain no acute intracranial process. No etiology is seen for the patient's seizure. 11/02 bedside LP attempted but unsuccessful  11/03  No seizures seen on EEG overnight  11/04 weaning sedation to assess for seizure reoccurrence  11/9 bronchoscopy with noted thick secretions in LLL 11/10 Left PICC placed 11/11 Trach,  Cor Track placed, New Embolic Strokes per MRI 47/42 Post Trach Day 1, some oozing from site, Echo results noted below 11/13 Low grade fever, tachy, LFT's remain elevated with ALT and Alk Phos continuing to rise.  S/p lasix  11/14 weaned PSV 10/5, no changes, foley removed 11/15 on ATC, TEE attempted- unable to pass probe, briefly on Neo, cortrak removed fort TEE, tmax 102.3 11/17 afebrile, NG on cultures, continues to have copious secretions 11/20 fever trend is down, on Ancef 11/22: only tolerating  PSV trials for 3 hours a day; broadened antibiotics to zosyn/vanc due to fever spike; recultured   Last Blood Cx 11/8 Neg Last sputum Cx ( BAL) >> 11/9 >> No growth Tracheal Aspirate 11/8>> Normal Flora Trach asp 11/13 > ngtd 11/13 BC x1 >> ngtd 11/16 trach asp >> GPC and GNR; staph Aureus 11/16 Bcx 2 >> ngtd 11/22-trach culture>>MSSA. Resistant to erythro, clinda, 11/22-MRSA PCR>>neg  MRI Brain 11/11 Interval development of multiple scattered acute ischemic infarcts involving the bilateral cerebral hemispheres and left pons, largest measuring 4.9 cm at the right occipital lobe. A central thromboembolic etiology is suspected given the various vascular distributions involved.  Echo 11/11 ECHO reviewed  - Left ventricle: The cavity size was normal. There was moderate    concentric hypertrophy. Systolic function was normal. The    estimated ejection fraction was in the range of 60% to 65%. Wall    motion was normal; there were no regional wall motion    abnormalities. Features are consistent with a pseudonormal left    ventricular filling pattern, with concomitant abnormal relaxation    and increased filling pressure (grade 2 diastolic dysfunction).  - Aortic valve: There was no significant regurgitation.  - Mitral valve: There was no significant regurgitation.  - Left atrium: The atrium was mildly dilated.  - Right ventricle: Systolic function was normal.  - Atrial septum: No defect or patent foramen ovale was identified.  - Tricuspid valve: There was no significant regurgitation.  - Pulmonic valve: There was  no significant regurgitation.  - Inferior vena cava: The vessel was normal in size. The    respirophasic diameter changes were in the normal range (>= 50%),    consistent with normal central venous pressure.   RUQ limited US 11/13 > Heterogeneous increased hepatic parenchymal echogenicity typical of steatosis or other intrinsic hepatocellular disease. Nodular hepatic  contours suggesting cirrhosis.  Unremarkable sonographic appearance of the gallbladder and biliary tree.  Interim History / Subjective:  Patient became afebrile after initiation of antibiotics with Vanco and Zosyn Tolerated Trach collar >24 hours Ultrasound of upper extremity shows superficial vein thrombophlebitis   Objective   Blood pressure (!) 141/66, pulse 99, temperature 98.8 F (37.1 C), temperature source Oral, resp. rate 18, height _0  (1.778 m), weight 73.7 kg, SpO2 99 %.    FiO2 (%):  [28 %-35 %] 28 %   Intake/Output Summary (Last 24 hours) at 11/18/2021 1032 Last data filed at 11/18/2021 0800 Gross per 24 hour  Intake 2121.46 ml  Output 2650 ml  Net -528.54 ml   Physical Exam: General: Chronically ill-appearing, s/p trach HENT: [place], AT, OP clear, MMM Neck: Trach in place, c/d/i Eyes: Left eye blindness, EOMI, no scleral icterus Respiratory: Coarse breath sounds all over.  No crackles, wheezing or rales Cardiovascular: RRR, -M/R/G, no JVD GI: BS+, soft, nontender Neuro: Awake and alert, follows commands Skin: No rash   Resolved Hospital Problem list   AKI due to contrast-induced ATN, resolved Nontraumatic rhabdomyolysis, resolved Non-anion Gap Metabolic Acidosis, resolved Assessment & Plan:  Acute multifocal bilateral emboic stroke 11/11: TEE difficult and unsuccessful; source unclear Alcohol abuse with prior SDH Status epilepticus-resolved; no seizures on EEG; Appreciate neurology input Continue aspirin Continue secondary stroke prophylaxis Continue lacosamide and keppra Seizure precautions in place  Sepsis: started zosyn 11/17 -> Ancef 11/19 -> vanc/zosyn 11/22 Repeat trach cx with MSSA. Since the start of vancomycin and Zosyn patient became afebrile, will complete 7 days therapy Fever source could be upper extremity superficial vein thrombophlebitis Sepsis indices are improving  Acute hypoxemic respiratory failure, on mechanical ventilation Status  post tracheostomy 11/11 Tolerated trach collar for more than 24-hour Pulmonary toiletry: CPT, hypertonic nebs, and albuterol scheduled PT/OT   Hypernatremia:  Serum sodium remain elevated 147 Continue free water flushes Monitor serum sodium  Hypokalemia, resolved Monitor and supplement electrolytes  Cirrhosis: lft improving Trend CMP Supportive care  Prediabetes Continue SSI and cbg monitoring  HTN, better controlled now Continue clonidine, hydralazine, hydrochlorothiazide, amlodipine, Coreg  Anemia of critical illness Continue to trend Transfuse for hgb <7  Goals of care Daughter is still declining to make decisions regarding his goals of care. Palliative following.  Appreciate palliative care involvement  Best Practice (right click and "Reselect all SmartList Selections" daily)   Diet/type: tubefeeds DVT prophylaxis: prophylactic heparin  GI prophylaxis: PPI Lines: N/A Foley:  condom cath Code Status:  full code Last GOC: 11/23 appreciate palliative care involvement    Jacky Kindle MD [place] [facility] See Amion for pager If no response to pager, please call [phone] until 7pm After 7pm, Please call E-link [phone]

## 2021-11-18 NOTE — Progress Notes (Signed)
Pharmacy Antibiotic Note  Jeffrey Mullen is a 70 y.o. male admitted on 10/23/2021 with persistent fever despite treatment for MSSA Pneumonia. Fever curve improved with addition of vancomycin. Pharmacy has been consulted for vancomycin dosing. Patient continues on Zosyn.  Scr has been stable. Continue 7 day course from 11/22 per MD. Last day 11/28. Trough level high at 26, drawn correctly. This indicates that his renal clearance isn't as high as calculated. Hold next dose and decrease dose to 730m Q12h (estimated trough 15)    Plan: Hold vancomycin x1 and decrease to 750 mg IV q12h (Estimated trough 15) Zosyn 3.375g IV q8h (4 hour infusion). Monitor cultures, clinical status, renal fx  Last day 11/28  Height: _0  (177.8 cm) Weight: 73.7 kg (162 lb 7.7 oz) IBW/kg (Calculated) : 73  Temp (24hrs), Avg:98.6 F (37 C), Min:97.8 F (36.6 C), Max:98.9 F (37.2 C)  Recent Labs  Lab 11/13/21 0757 11/14/21 0331 11/15/21 0339 11/16/21 0234 11/17/21 0253 11/18/21 0302 11/18/21 1243  WBC 7.6 9.0 10.8* 10.9* 10.3  --   --   CREATININE  --  0.86 0.90 0.94 0.96  0.94 0.77  --   VANCOTROUGH  --   --   --   --   --   --  26*    Estimated Creatinine Clearance: 92.5 mL/min (by C-G formula based on SCr of 0.77 mg/dL).    No Active Allergies  Antimicrobials this admission: 11/1 acyclovir >>11/5 11/4 ceftriaxone >>11/10; 11/11>>11/13 11/4 azithro >>11/7 11/8 Unasyn >>11/12 Zosyn 11/17 >> 11/19, 11/22>>(11/28)  Ancef 11/19 >> 11/22 Vancomycin 11/22>> (11/28)  Microbiology results: 11/2 CSF culture - neg HSV PCR- negative 11/4 TA - abundant H.flu, beta lactamase neg 11/8 TA - few GPC 11/8 BCx - neg 11/9 BAL - negative 11/13 BCx - negative 11/13 TA - negative 11/16 TA - MSSA 11/16 BCx - 1/2 GPC (BCID MRSE), likely a contaminant  11/17 MRSA PCR - negative 11/22 TA - MSSA   Thank you for allowing pharmacy to be a part of this patient's care.  LBenetta Spar PharmD, BCPS,  [facility] Clinical Pharmacist  Please check AMION for all MAmherstphone numbers After 10:00 PM, call [phone]

## 2021-11-18 NOTE — Progress Notes (Addendum)
Nutrition Follow-up  DOCUMENTATION CODES:   Not applicable  INTERVENTION:   Change TF to Vital 1.5 at 60 ml/h Continue Prosource TF 45 ml TID  Provides 2280 kcal, 130 gm protein, 1100 ml free water daily.  Continue MVI with minerals daily via tube.  Continue Juven BID, each packet provides 80-95 calories, 8-10 grams of carbohydrate, 2.5  grams of protein (collagen), 7 grams of L-arginine and 7 grams of L-glutamine; supplement contains CaHMB, Vitamins C, E, B12 and Zinc to promote wound healing  Continue free water flushes 300 ml every 8 hours for a total of 2000 ml free water daily.  Recommend consulting with Pharmacy to adjust any medications that may be causing loose stools.   NUTRITION DIAGNOSIS:   Inadequate oral intake related to inability to eat as evidenced by NPO status.  Ongoing  GOAL:   Patient will meet greater than or equal to 90% of their needs  Met with TF  MONITOR:   Vent status, Labs, Weight trends, TF tolerance, Skin, I & O's  REASON FOR ASSESSMENT:   Ventilator    ASSESSMENT:   Pt with PMH of hepatitis C, alcohol abuse and seizures admitted with with status epilepticus.  Notified by RN that patient is having watery diarrhea. Likely related to ongoing acute illness and multiple medications, including antibiotics and Tylenol. RN is holding daily Miralax and Colace. He has wounds and the concern is that loose stool will get in the wounds and cause further infection.  Patient remains on vent support.   Labs reviewed. Na 147 CBG: 105-160  Medications reviewed and include folic acid, Novolog, Keppra, MVI with minerals, Juven, Protonix, thiamine, IV Zosyn, IV vancomycin, Tylenol prn (received one dose this AM). Imodium started today.    Diet Order:   Diet Order             Diet NPO time specified  Diet effective midnight                   EDUCATION NEEDS:   Not appropriate for education at this time  Skin:  Skin Assessment: Skin  Integrity Issues: Skin Integrity Issues:: Stage II, DTI, Unstageable, Other (Comment) DTI: L heel Stage II: R heel, L elbow, anus Unstageable: L/R buttocks Other: skin tears to penis, back, arm    Last BM:  11/26 type 7  Height:   Ht Readings from Last 1 Encounters:  11/02/21 _0  (1.778 m)    Weight:   Wt Readings from Last 1 Encounters:  11/18/21 73.7 kg    Ideal Body Weight:  75.5 kg  BMI:  Body mass index is 23.31 kg/m.  Estimated Nutritional Needs:   Kcal:  2100-2300  Protein:  120-140 grams  Fluid:  > 2 /day    Lucas Mallow, RD, LDN, CNSC Please refer to Amion for contact information.

## 2021-11-18 NOTE — Progress Notes (Signed)
Speech Language Pathology Treatment: Jeffrey Mullen Speaking valve;Cognitive-Linquistic  Patient Details Name: Jeffrey Mullen MRN: [id number] DOB: 1951-07-10 Today's Date: 11/18/2021 Time: 7001-7494 SLP Time Calculation (min) (ACUTE ONLY): 15 min  Assessment / Plan / Recommendation Clinical Impression  Jeffrey Mullen continues to make progress towards cognitive and goal re: utilization of upper airway for vocalization. Cuff deflated on arrival and Jeffrey Mullen able to produce faint vocalization around trach. PMV donned allowing for low intensity and hoarse verbalizations mostly in single words and 70% intelligible. Vitals stable with HR 96, RR 20 and SpO2 95% and adequate exhalations without evidence of back pressure. He was oriented to month and and recent holiday, followed simple commands with accuracy. Discussed with RN that pt may wear valve with staff throughout the day when given full supervision. Plan to focus on dysphagia next session.      HPI HPI: 70 y/o male presented to ED on 10/23/21 after bystanders witnessed patient collapse with associated seizure. Another seizure witnessed in ED. CT head negative. CT C-spine negative. LTM EEG on 11/1 showed 2 seizures from R anterior temporal region. Last seizure noted on EEG on 11/3. Intubated 10/31-11/11. Trach placed 11/11. 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  Recommendations may be updated based on patient status, additional functional criteria and insurance authorization.    Recommendations         Patient may use Passy-Muir  Speech Valve: During all therapies with supervision PMSV Supervision: Full MD: Please consider changing trach tube to : Cuffless         Oral Care Recommendations: Oral care QID Follow Up Recommendations: SLP at Long-term acute care hospital Assistance recommended at discharge: Frequent or constant Supervision/Assistance SLP Visit Diagnosis: Aphonia (R49.1);Cognitive communication deficit (W96.759) Plan: Continue with current plan of care       GO                Jeffrey Mullen  11/18/2021, 10:11 AM

## 2021-11-18 NOTE — Plan of Care (Signed)

## 2021-11-19 DIAGNOSIS — J9601 Acute respiratory failure with hypoxia: Secondary | ICD-10-CM | POA: Diagnosis not present

## 2021-11-19 DIAGNOSIS — E870 Hyperosmolality and hypernatremia: Secondary | ICD-10-CM | POA: Diagnosis not present

## 2021-11-19 LAB — GLUCOSE, CAPILLARY
Glucose-Capillary: 133 mg/dL — ABNORMAL HIGH (ref 70–99)
Glucose-Capillary: 138 mg/dL — ABNORMAL HIGH (ref 70–99)
Glucose-Capillary: 146 mg/dL — ABNORMAL HIGH (ref 70–99)
Glucose-Capillary: 155 mg/dL — ABNORMAL HIGH (ref 70–99)
Glucose-Capillary: 155 mg/dL — ABNORMAL HIGH (ref 70–99)
Glucose-Capillary: 177 mg/dL — ABNORMAL HIGH (ref 70–99)

## 2021-11-19 LAB — RENAL FUNCTION PANEL
Albumin: 2.4 g/dL — ABNORMAL LOW (ref 3.5–5.0)
Anion gap: 9 (ref 5–15)
BUN: 32 mg/dL — ABNORMAL HIGH (ref 8–23)
CO2: 23 mmol/L (ref 22–32)
Calcium: 9.4 mg/dL (ref 8.9–10.3)
Chloride: 119 mmol/L — ABNORMAL HIGH (ref 98–111)
Creatinine, Ser: 0.97 mg/dL (ref 0.61–1.24)
GFR, Estimated: >60 mL/min (ref >60)
Glucose, Bld: 151 mg/dL — ABNORMAL HIGH (ref 70–99)
Phosphorus: 3.5 mg/dL (ref 2.5–4.6)
Potassium: 3.6 mmol/L (ref 3.5–5.1)
Sodium: 151 mmol/L — ABNORMAL HIGH (ref 135–145)

## 2021-11-19 MED ORDER — LATANOPROST 0.005 % OP SOLN
1.0000 [drp] | Freq: Every day | OPHTHALMIC | Status: DC
  Administered 2021-11-19 – 2021-12-13 (×25): 1 [drp] via OPHTHALMIC
  Filled 2021-11-19: qty 2.5

## 2021-11-19 MED ORDER — DORZOLAMIDE HCL-TIMOLOL MAL 2-0.5 % OP SOLN
1.0000 [drp] | Freq: Two times a day (BID) | OPHTHALMIC | Status: DC
  Administered 2021-11-19 – 2021-12-14 (×51): 1 [drp] via OPHTHALMIC
  Filled 2021-11-19: qty 10

## 2021-11-19 MED ORDER — FREE WATER
300.0000 mL | Freq: Four times a day (QID) | Status: DC
  Administered 2021-11-19 – 2021-11-20 (×4): 300 mL

## 2021-11-19 NOTE — Progress Notes (Addendum)
[facility] PROGRESS NOTE   Jeffrey Mullen MVH:[id number] DOB: 1951-01-10 DOA: 10/23/2021  PCP: [name], NP  Brief History/Interval Summary: 70 y/o M with past medical history of hypertension, glaucoma who presented to [facility] ER on 10/31 via EMS with reports of altered mental status.  Given  right sided gaze, altered and left sided weakness, code stoke called. The patient was taken emergently to CT for imaging which was negative for LVO.  While in the [facility], he had a witnessed seizure.  PCCM called for ICU admission. Chart review shows he was seen in the ER on 10/4 after passing out at the [employer].  Physical exam was negative at the time.  Labs negative.  No further imaging obtained.    Consultants: Neurology.  Critical care medicine  Significant Hospital Events: Including procedures, antibiotic start and stop dates in addition to other pertinent events   10/31 Admit with AMS, seizure, hypertensive emergency, AKI 11/1 MRI brain no acute intracranial process. No etiology is seen for the patient's seizure. 11/02 bedside LP attempted but unsuccessful  11/03  No seizures seen on EEG overnight  11/04 weaning sedation to assess for seizure reoccurrence  11/9 bronchoscopy with noted thick secretions in LLL 11/10 Left PICC placed 11/11 Trach,  Cor Track placed, New Embolic Strokes per MRI 84/13 Post Trach Day 1, some oozing from site, Echo results noted below 11/13 Low grade fever, tachy, LFT's remain elevated with ALT and Alk Phos continuing to rise.  S/p lasix  11/14 weaned PSV 10/5, no changes, foley removed 11/15 on ATC, TEE attempted- unable to pass probe, briefly on Neo, cortrak removed fort TEE, tmax 102.3 11/17 afebrile, NG on cultures, continues to have copious secretions 11/20 fever trend is down, on Ancef 11/22: only tolerating PSV trials for 3 hours a day; broadened antibiotics to zosyn/vanc due to fever spike; recultured     Last Blood Cx 11/8 Neg Last  sputum Cx ( BAL) >> 11/9 >> No growth Tracheal Aspirate 11/8>> Normal Flora Trach asp 11/13 > ngtd 11/13 BC x1 >> ngtd 11/16 trach asp >> GPC and GNR; staph Aureus 11/16 Bcx 2 >> ngtd 11/22-trach culture>>MSSA. Resistant to erythro, clinda, 11/22-MRSA PCR>>neg   Subjective/Interval History: Patient confused.  Does not answer any questions.  He has a tracheostomy.     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  Patient was seen by neurology.  Currently on aspirin.   Transthoracic echocardiogram showed normal systolic function.  Grade 2 diastolic dysfunction was noted.  No embolic source was identified.  TEE was attempted but could not be performed successfully. LDL noted to be 66.  HbA1c 6.0.  Patient is on statin.  Status epilepticus Resolved.  No seizure activity noted on EEG.  Patient remains on Keppra and lacosamide.  Continue to monitor.  Acute metabolic encephalopathy Patient noted to be confused.  Does not answer any questions.  Likely secondary to all of his acute issues including epilepsy and stroke and acute respiratory failure.  Sepsis likely secondary to tracheitis Trach cultures were positive for MSSA.  Patient was started initially on Zosyn then changed over to Ancef and then changed back to vancomycin and Zosyn due to development of fever.  Also noted to have upper extremity superficial vein thrombophlebitis which could have also contributed to fever.  Sepsis physiology appears to be improving.  Plan is to treat him with vancomycin and Zosyn for 7 days.  Last date will be 11/29.  Acute respiratory failure with hypoxia requiring mechanical ventilation/status post tracheostomy Underwent tracheostomy on 11/11.  Required mechanical ventilation.  Currently saturating reasonably well on trach collar.  Pulmonary toilet. Trach care per PCCM.  Hypernatremia Sodium level has crept up to 251.  He is currently getting tube feedings through  core track.  He is noted to be on free water 300 mL every 8 hours.  Will increase to every 6 hours.  Recheck sodium level tomorrow.  Essential hypertension Currently on multiple antihypertensives including amlodipine, carvedilol, clonidine and irbesartan.  Blood pressure reasonably well controlled.  Continue to monitor.  Anemia of critical illness Hemoglobin had been stable.  No overt bleeding noted.  Hemoglobin was 8.5 when last checked on 11/25.  We will recheck it tomorrow.  Hypokalemia Normal today.  Continue to monitor.  Left arm extravasation injury Status post phentolamine and nitroglycerin application.  Wound care.  Liver cirrhosis Stable.  Acute kidney injury due to contrast-induced ATN Resolved.  Monitor urine output.  Nontraumatic rhabdomyolysis Resolved  Goals of care Palliative medicine has been following.  They had been communicating with patient's former significant other.  Apparently this person requested that she no longer wanted to be the surrogate decision maker.  Social worker to help conduct search for alternative family members/next of kin.  Discussed the situation with critical care MD, Dr. Tacy Learn.  In view of patient's current clinical status and guarded prognosis it was felt that if patient were to have cardiac arrest any chance of meaningful recovery would be extremely low with continued poor quality of life.  So resuscitation including CPR and defibrillation would not offer benefit.  Both of [place] concurred in this matter.  Will change patient to DNR based on two-physician documentation once Dr. Tacy Learn also writes a note to reflect this..   DVT Prophylaxis: Subcutaneous heparin Code Status: Full code Family Communication: None at bedside Disposition Plan: Unclear disposition at this time.  Status is: Inpatient  Remains inpatient appropriate because: Tracheostomy, altered mental status     Medications: Scheduled:  albuterol  2.5 mg Nebulization TID    amLODipine  10 mg Per Tube Daily   aspirin  81 mg Per Tube Daily   atorvastatin  40 mg Per Tube Daily   carvedilol  12.5 mg Per Tube BID WC   chlorhexidine  15 mL Mouth Rinse BID   Chlorhexidine Gluconate Cloth  6 each Topical Daily   cloNIDine  0.1 mg Per Tube TID   collagenase   Topical BID   feeding supplement (PROSource TF)  45 mL Per Tube TID   feeding supplement (VITAL 1.5 CAL)  1,000 mL Per Tube L79G   folic acid  1 mg Per Tube Daily   free water  300 mL Per Tube Q8H   heparin  5,000 Units Subcutaneous Q8H   insulin aspart  0-15 Units Subcutaneous Q4H   irbesartan  75 mg Per Tube Daily   lacosamide  200 mg Per Tube BID   levETIRAcetam  500 mg Per Tube BID   mouth rinse  15 mL Mouth Rinse q12n4p   multivitamin with minerals  1 tablet Per Tube Daily   nutrition supplement (JUVEN)  1 packet Per Tube BID BM   pantoprazole sodium  40 mg Per Tube QHS   scopolamine  1 patch Transdermal Q72H   sodium chloride flush  10-40 mL Intracatheter Q12H   thiamine  100 mg Per Tube Daily   Continuous:  sodium chloride Stopped (11/07/21 0016)   sodium  chloride 5 mL/hr at 11/19/21 0358   piperacillin-tazobactam (ZOSYN)  IV 3.375 g (11/19/21 0549)   vancomycin Stopped (11/19/21 0231)   QZE:SPQZRA chloride, acetaminophen (TYLENOL) oral liquid 160 mg/5 mL, docusate, fentaNYL (SUBLIMAZE) injection, guaiFENesin, hydrALAZINE, ipratropium-albuterol, labetalol, loperamide HCl, polyethylene glycol, sodium chloride flush  Antibiotics: Anti-infectives (From admission, onward)    Start     Dose/Rate Route Frequency Ordered Stop   11/19/21 0000  vancomycin (VANCOREADY) IVPB 750 mg/150 mL        750 mg 150 mL/hr over 60 Minutes Intravenous Every 12 hours 11/18/21 1335 11/20/21 2359   11/15/21 0000  vancomycin (VANCOREADY) IVPB 1250 mg/250 mL  Status:  Discontinued        1,250 mg 166.7 mL/hr over 90 Minutes Intravenous Every 12 hours 11/14/21 1116 11/18/21 1335   11/14/21 1400  piperacillin-tazobactam  (ZOSYN) IVPB 3.375 g        3.375 g 12.5 mL/hr over 240 Minutes Intravenous Every 8 hours 11/14/21 1245 11/20/21 2359   11/14/21 1215  piperacillin-tazobactam (ZOSYN) IVPB 3.375 g  Status:  Discontinued        3.375 g 12.5 mL/hr over 240 Minutes Intravenous Every 8 hours 11/14/21 1116 11/14/21 1245   11/14/21 1215  vancomycin (VANCOREADY) IVPB 1750 mg/350 mL        1,750 mg 175 mL/hr over 120 Minutes Intravenous  Once 11/14/21 1116 11/14/21 1424   11/11/21 2000  ceFAZolin (ANCEF) IVPB 2g/100 mL premix  Status:  Discontinued        2 g 200 mL/hr over 30 Minutes Intravenous Every 8 hours 11/11/21 1404 11/14/21 1116   11/09/21 2330  piperacillin-tazobactam (ZOSYN) IVPB 3.375 g  Status:  Discontinued        3.375 g 12.5 mL/hr over 240 Minutes Intravenous Every 8 hours 11/09/21 1659 11/11/21 1404   11/09/21 1745  piperacillin-tazobactam (ZOSYN) IVPB 3.375 g        3.375 g 100 mL/hr over 30 Minutes Intravenous  Once 11/09/21 1659 11/09/21 1819   11/03/21 2100  cefTRIAXone (ROCEPHIN) 2 g in sodium chloride 0.9 % 100 mL IVPB  Status:  Discontinued        2 g 200 mL/hr over 30 Minutes Intravenous Every 24 hours 11/03/21 1533 11/05/21 1056   10/31/21 0930  Ampicillin-Sulbactam (UNASYN) 3 g in sodium chloride 0.9 % 100 mL IVPB  Status:  Discontinued        3 g 200 mL/hr over 30 Minutes Intravenous Every 6 hours 10/31/21 0837 11/03/21 1533   10/27/21 2200  linezolid (ZYVOX) IVPB 600 mg  Status:  Discontinued        600 mg 300 mL/hr over 60 Minutes Intravenous Every 12 hours 10/27/21 1650 10/28/21 1635   10/27/21 1045  cefTRIAXone (ROCEPHIN) 2 g in sodium chloride 0.9 % 100 mL IVPB  Status:  Discontinued        2 g 200 mL/hr over 30 Minutes Intravenous Every 24 hours 10/27/21 0957 10/31/21 0837   10/27/21 1045  azithromycin (ZITHROMAX) 500 mg in sodium chloride 0.9 % 250 mL IVPB  Status:  Discontinued        500 mg 250 mL/hr over 60 Minutes Intravenous Every 24 hours 10/27/21 0957 10/30/21 1121    10/26/21 1000  acyclovir (ZOVIRAX) 795 mg in dextrose 5 % 150 mL IVPB  Status:  Discontinued        10 mg/kg  79.3 kg 165.9 mL/hr over 60 Minutes Intravenous Every 12 hours 10/26/21 0825 10/26/21 0900   10/26/21 1000  acyclovir (ZOVIRAX) 800 mg in dextrose 5 % 150 mL IVPB  Status:  Discontinued        800 mg 166 mL/hr over 60 Minutes Intravenous Every 12 hours 10/26/21 0900 10/28/21 0722   10/24/21 1445  acyclovir (ZOVIRAX) 795 mg in dextrose 5 % 150 mL IVPB  Status:  Discontinued        10 mg/kg  79.3 kg 165.9 mL/hr over 60 Minutes Intravenous Every 8 hours 10/24/21 1345 10/26/21 0825       Objective:  Vital Signs  Vitals:   11/19/21 0438 11/19/21 0446 11/19/21 0718 11/19/21 0738  BP:   (!) 165/84 (!) 165/84  Pulse: (!) 107 (!) 104 (!) 105 (!) 103  Resp: (!) _0 Temp:      TempSrc:      SpO2:  98%  99%  Weight:      Height:        Intake/Output Summary (Last 24 hours) at 11/19/2021 0931 Last data filed at 11/19/2021 0653 Gross per 24 hour  Intake 907.09 ml  Output 2350 ml  Net -1442.91 ml   Filed Weights   11/16/21 0500 11/17/21 0408 11/18/21 0423  Weight: 82.8 kg 77.3 kg 73.7 kg    General appearance: Awake alert.  Confused.  Distracted. Tracheostomy noted.  NG tube is noted. Resp: Coarse breath sounds bilaterally.  Mildly tachypneic.  Few crackles at the bases.  No wheezing or rhonchi. Cardio: S1-S2 is normal regular.  No S3-S4.  No rubs murmurs or bruit GI: Abdomen is soft.  Nontender nondistended.  Bowel sounds are present normal.  No masses organomegaly Extremities: No edema.  Noted to be moving all of his extremities Neurologic: Disoriented.  No obvious focal deficits noted.   Lab Results:  Data Reviewed: I have personally reviewed following labs and imaging studies  CBC: Recent Labs  Lab 11/13/21 0757 11/14/21 0331 11/15/21 0339 11/16/21 0234 11/17/21 0253  WBC 7.6 9.0 10.8* 10.9* 10.3  NEUTROABS 5.3  --   --   --   --   HGB 7.5* 7.6*  8.1* 8.2* 8.5*  HCT 24.4* 23.5* 25.4* 26.3* 26.5*  MCV 90.0 87.0 87.6 88.3 88.6  PLT 393 396 443* 475* 454*    Basic Metabolic Panel: Recent Labs  Lab 11/14/21 0331 11/15/21 0339 11/16/21 0234 11/17/21 0253 11/18/21 0302 11/19/21 0146  NA 145 147* 146* 147*  148* 147* 151*  K 3.9 3.7 3.5 3.7  3.8 3.5 3.6  CL 110 112* 112* 115*  115* 114* 119*  CO2 _1 GLUCOSE 158* 151* 149* 166*  165* 137* 151*  BUN 25* 28* 32* 32*  32* 30* 32*  CREATININE 0.86 0.90 0.94 0.96  0.94 0.77 0.97  CALCIUM 9.4 9.1 9.2 9.0  9.0 9.4 9.4  MG 2.2  --   --   --   --   --   PHOS 3.4 3.5 3.6 4.0 3.7 3.5    GFR: Estimated Creatinine Clearance: 76.3 mL/min (by C-G formula based on SCr of 0.97 mg/dL).  Liver Function Tests: Recent Labs  Lab 11/14/21 0331 11/15/21 0339 11/16/21 0234 11/17/21 0253 11/18/21 0302 11/19/21 0146  AST 94*  --   --  122*  --   --   ALT 87*  --   --  119*  --   --   ALKPHOS 371*  --   --  330*  --   --   BILITOT 0.4  --   --  0.7  --   --   PROT 7.3  --   --  7.6  --   --   ALBUMIN 2.0*  2.0* 2.0* 2.1* 2.2*  2.2* 2.3* 2.4*     CBG: Recent Labs  Lab 11/18/21 1142 11/18/21 1540 11/19/21 0014 11/19/21 0415 11/19/21 0818  GLUCAP 161* 156* 155* 155* 146*     Recent Results (from the past 240 hour(s))  MRSA Next Gen by PCR, Nasal     Status: None   Collection Time: 11/09/21  5:53 PM   Specimen: Nasal Mucosa; Nasal Swab  Result Value Ref Range Status   MRSA by PCR Next Gen NOT DETECTED NOT DETECTED Final    Comment: (NOTE) The GeneXpert MRSA Assay (FDA approved for NASAL specimens only), is one component of a comprehensive MRSA colonization surveillance program. It is not intended to diagnose MRSA infection nor to guide or monitor treatment for MRSA infections. Test performance is not FDA approved in patients less than 5 years old. Performed at [facility], [place] [address] [place], [place] [address]   Culture,  Respiratory w Gram Stain     Status: None   Collection Time: 11/14/21 11:45 AM   Specimen: Tracheal Aspirate; Respiratory  Result Value Ref Range Status   Specimen Description TRACHEAL ASPIRATE  Final   Special Requests NONE  Final   Gram Stain   Final    RARE WBC PRESENT, PREDOMINANTLY PMN RARE GRAM POSITIVE COCCI IN PAIRS Performed at [facility], [place] [address], [place], [place] [address]    Culture FEW STAPHYLOCOCCUS AUREUS  Final   Report Status 11/16/2021 FINAL  Final   Organism ID, Bacteria STAPHYLOCOCCUS AUREUS  Final      Susceptibility   Staphylococcus aureus - MIC*    CIPROFLOXACIN <=0.5 SENSITIVE Sensitive     ERYTHROMYCIN >=8 RESISTANT Resistant     GENTAMICIN <=0.5 SENSITIVE Sensitive     OXACILLIN 0.5 SENSITIVE Sensitive     TETRACYCLINE <=1 SENSITIVE Sensitive     VANCOMYCIN 1 SENSITIVE Sensitive     TRIMETH/SULFA <=10 SENSITIVE Sensitive     CLINDAMYCIN RESISTANT Resistant     RIFAMPIN <=0.5 SENSITIVE Sensitive     Inducible Clindamycin POSITIVE Resistant     * FEW STAPHYLOCOCCUS AUREUS  MRSA Next Gen by PCR, Nasal     Status: None   Collection Time: 11/15/21  8:00 AM   Specimen: Nasal Mucosa; Nasal Swab  Result Value Ref Range Status   MRSA by PCR Next Gen NOT DETECTED NOT DETECTED Final    Comment: (NOTE) The GeneXpert MRSA Assay (FDA approved for NASAL specimens only), is one component of a comprehensive MRSA colonization surveillance program. It is not intended to diagnose MRSA infection nor to guide or monitor treatment for MRSA infections. Test performance is not FDA approved in patients less than 70 years old. Performed at [facility], [place] [address], [place],  [address]       Radiology Studies: VAS [place] UPPER EXTREMITY VENOUS DUPLEX  Result Date: 11/18/2021 UPPER VENOUS STUDY  Patient Name:  Jeffrey Mullen  Date of Exam:   11/17/2021 Medical Rec #: [id number]          Accession #:    [id number] Date of Birth: 06/16/1951          Patient Gender: M Patient Age:   43 years Exam Location:  [facility] Procedure:      VAS [place] UPPER EXTREMITY VENOUS DUPLEX  Referring Phys: Noemi Chapel --------------------------------------------------------------------------------  Indications: S/P PICC left arm Limitations: Constant patient movement, collar, bandaging. Comparison Study: 11-02-2021 Bilateral upper extremity venous was negative. Performing Technologist: Darlin Coco RDMS, RVT  Examination Guidelines: A complete evaluation includes B-mode imaging, spectral Doppler, color Doppler, and power Doppler as needed of all accessible portions of each vessel. Bilateral testing is considered an integral part of a complete examination. Limited examinations for reoccurring indications may be performed as noted.  Right Findings: +----------+------------+---------+-----------+----------+---------------------+ RIGHT     CompressiblePhasicitySpontaneousProperties       Summary        +----------+------------+---------+-----------+----------+---------------------+ Subclavian                                            Constant patient                                                         movement, unable to                                                             obtain         +----------+------------+---------+-----------+----------+---------------------+  Left Findings: +----------+------------+---------+-----------+----------+---------------------+ LEFT      CompressiblePhasicitySpontaneousProperties       Summary        +----------+------------+---------+-----------+----------+---------------------+ IJV           Full       Yes       Yes                                    +----------+------------+---------+-----------+----------+---------------------+ Subclavian    Full       Yes       Yes                                     +----------+------------+---------+-----------+----------+---------------------+ Axillary      Full       Yes       Yes                                    +----------+------------+---------+-----------+----------+---------------------+ Brachial      Full                                                        +----------+------------+---------+-----------+----------+---------------------+ Radial        Full                                                        +----------+------------+---------+-----------+----------+---------------------+  Ulnar         Full                                                        +----------+------------+---------+-----------+----------+---------------------+ Cephalic      None       No        No                 Age Indeterminate   +----------+------------+---------+-----------+----------+---------------------+ Basilic       Full                                   Not well visualized                                                         due to patient                                                         positioning/movement  +----------+------------+---------+-----------+----------+---------------------+  Summary:  Left: No evidence of deep vein thrombosis in the upper extremity. Findings consistent with age indeterminate superficial vein thrombosis involving the left cephalic vein.  *See table(s) above for measurements and observations.  Diagnosing physician: Orlie Pollen Electronically signed by Orlie Pollen on 11/18/2021 at 10:39:30 AM.    Final        LOS: 82 days   Bonnielee Haff  [facility] Pager on [email or web address]  11/19/2021, 9:31 AM

## 2021-11-19 NOTE — Care Plan (Signed)
It is my professional opinion, with a very high degree of medical certainty, that the degree of debility and current physical condition patient's quality of life will not change, he will remain bed bound despite any intervention with a guarded prognosis  I recommend the following:   Place and maintain DNR order, in the event of physiologic death and cardiac arrest any attempt at resuscitation would not be medically indicated, or in line with medical ethics standards of care and only cause pain and suffering that the end of her life.     Jacky Kindle MD [facility] See Amion for pager If no response to pager, please call [phone] until 7pm After 7pm, Please call E-link [phone]

## 2021-11-19 NOTE — Plan of Care (Signed)

## 2021-11-20 DIAGNOSIS — J9601 Acute respiratory failure with hypoxia: Secondary | ICD-10-CM | POA: Diagnosis not present

## 2021-11-20 DIAGNOSIS — Z930 Tracheostomy status: Secondary | ICD-10-CM | POA: Diagnosis not present

## 2021-11-20 DIAGNOSIS — E870 Hyperosmolality and hypernatremia: Secondary | ICD-10-CM | POA: Diagnosis not present

## 2021-11-20 DIAGNOSIS — J0410 Acute tracheitis without obstruction: Secondary | ICD-10-CM | POA: Diagnosis not present

## 2021-11-20 LAB — GLUCOSE, CAPILLARY
Glucose-Capillary: 100 mg/dL — ABNORMAL HIGH (ref 70–99)
Glucose-Capillary: 126 mg/dL — ABNORMAL HIGH (ref 70–99)
Glucose-Capillary: 126 mg/dL — ABNORMAL HIGH (ref 70–99)
Glucose-Capillary: 126 mg/dL — ABNORMAL HIGH (ref 70–99)
Glucose-Capillary: 156 mg/dL — ABNORMAL HIGH (ref 70–99)
Glucose-Capillary: 156 mg/dL — ABNORMAL HIGH (ref 70–99)
Glucose-Capillary: 159 mg/dL — ABNORMAL HIGH (ref 70–99)
Glucose-Capillary: 169 mg/dL — ABNORMAL HIGH (ref 70–99)

## 2021-11-20 LAB — COMPREHENSIVE METABOLIC PANEL
ALT: 168 U/L — ABNORMAL HIGH (ref 0–44)
AST: 117 U/L — ABNORMAL HIGH (ref 15–41)
Albumin: 2.5 g/dL — ABNORMAL LOW (ref 3.5–5.0)
Alkaline Phosphatase: 321 U/L — ABNORMAL HIGH (ref 38–126)
Anion gap: 9 (ref 5–15)
BUN: 27 mg/dL — ABNORMAL HIGH (ref 8–23)
CO2: 21 mmol/L — ABNORMAL LOW (ref 22–32)
Calcium: 9.3 mg/dL (ref 8.9–10.3)
Chloride: 122 mmol/L — ABNORMAL HIGH (ref 98–111)
Creatinine, Ser: 0.84 mg/dL (ref 0.61–1.24)
GFR, Estimated: >60 mL/min (ref >60)
Glucose, Bld: 128 mg/dL — ABNORMAL HIGH (ref 70–99)
Potassium: 4.3 mmol/L (ref 3.5–5.1)
Sodium: 152 mmol/L — ABNORMAL HIGH (ref 135–145)
Total Bilirubin: 0.7 mg/dL (ref 0.3–1.2)
Total Protein: 8 g/dL (ref 6.5–8.1)

## 2021-11-20 LAB — CBC
HCT: 34.7 % — ABNORMAL LOW (ref 39.0–52.0)
Hemoglobin: 10.7 g/dL — ABNORMAL LOW (ref 13.0–17.0)
MCH: 28 pg (ref 26.0–34.0)
MCHC: 30.8 g/dL (ref 30.0–36.0)
MCV: 90.8 fL (ref 80.0–100.0)
Platelets: 421 10*3/uL — ABNORMAL HIGH (ref 150–400)
RBC: 3.82 MIL/uL — ABNORMAL LOW (ref 4.22–5.81)
RDW: 18.8 % — ABNORMAL HIGH (ref 11.5–15.5)
WBC: 11.5 10*3/uL — ABNORMAL HIGH (ref 4.0–10.5)
nRBC: 0 % (ref 0.0–0.2)

## 2021-11-20 LAB — MAGNESIUM: Magnesium: 2.3 mg/dL (ref 1.7–2.4)

## 2021-11-20 MED ORDER — FREE WATER
250.0000 mL | Status: DC
  Administered 2021-11-20 – 2021-11-21 (×6): 250 mL

## 2021-11-20 NOTE — Progress Notes (Signed)
Physical Therapy Treatment Patient Details Name: Jeffrey Mullen MRN: [id number] DOB: 05-17-1951 Today's Date: 11/20/2021   History of Present Illness 70 y/o male presented to ED on 10/23/21 after bystanders witnessed patient collapse with associated seizure. Another seizure witnessed in ED. CT head negative. CT C-spine negative. LTM EEG on 11/1 showed 2 seizures from R anterior temporal region. Last seizure noted on EEG on 11/3. Intubated 10/31-11/11. Trach placed 11/11. 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 Pt did participate in bilat LE exercises but then became fatigued and fell asleep. Pt continues to require maxAX2 for all mobility and demo's impaired cognition as well. Continue to recommend LTAC due to medical, physical, and cognitive deficits. Acute PT to cont to follow.    Recommendations for follow up therapy are one component of a multi-disciplinary discharge planning process, led by the attending physician.  Recommendations may be updated based on patient status, additional functional criteria and insurance authorization.  Follow Up Recommendations  PT at Long-term acute care hospital     Assistance Recommended at Discharge Frequent or constant Supervision/Assistance  Equipment Recommendations   (TBD at next venue)    Recommendations for Other Services       Precautions / Restrictions Precautions Precautions: Fall Precaution Comments: trach, cortrak, rectal tube, condom cath Restrictions Weight Bearing Restrictions: No     Mobility  Bed Mobility Overal bed mobility: Needs Assistance Bed Mobility:  Rolling;Supine to Sit;Sit to Supine Rolling: Max assist;+2 for physical assistance   Supine to sit: Max assist;+2 for physical assistance     General bed mobility comments: with max tactile and verbal cues pt did assist with LEs to EOB, maxA for trunk elevation, pt with L lateral bias requiring maxA to maintain EOB balance    Transfers                   General transfer comment: not appropriate    Ambulation/Gait               General Gait Details: not appropriate   Stairs             Wheelchair Mobility    Modified Rankin (Stroke Patients Only) Modified Rankin (Stroke Patients Only) Pre-Morbid Rankin Score: No symptoms Modified Rankin: Severe disability     Balance Overall balance assessment: Needs assistance Sitting-balance support: Feet supported Sitting balance-Leahy Scale: Poor Sitting balance - Comments: pt occasionally modA but majority of time max/total assist to maintain EOB balance Postural control: Left lateral lean;Posterior lean                                  Cognition Arousal/Alertness: Awake/alert Behavior During Therapy: Flat affect Overall Cognitive Status: No family/caregiver present to determine baseline cognitive functioning                                 General Comments: pt repeatedly asking for water even though he was educated he can't have any, pt with delayed processing and very slow to follow  simple commands requiring tactile cues as well, pt was awake/alert and then became fatigued/sleepy during therapy        Exercises General Exercises - Lower Extremity Long Arc Quad: AAROM;Both;10 reps;Seated Hip Flexion/Marching: AAROM;Both;10 reps;Seated Other Exercises Other Exercises: assist pt into full LAQ and had pt hold it, pt able to hold for 2 sec prior to lowering LE bilaterally    General Comments General comments (skin integrity, edema, etc.): pt's L forearm wrapped in gauze, pt  rolled for pad change and noted to have poor skin integrity at the bottom      Pertinent Vitals/Pain Pain Assessment: Faces Faces Pain Scale: No hurt    Home Living                          Prior Function            PT Goals (current goals can now be found in the care plan section) Acute Rehab PT Goals PT Goal Formulation: With patient Time For Goal Achievement: 12/04/21 Potential to Achieve Goals: Fair Progress towards PT goals: Not progressing toward goals - comment    Frequency    Min 3X/week      PT Plan Current plan remains appropriate    Co-evaluation              AM-PAC PT "6 Clicks" Mobility   Outcome Measure  Help needed turning from your back to your side while in a flat bed without using bedrails?: A Lot Help needed moving from lying on your back to sitting on the side of a flat bed without using bedrails?: A Lot Help needed moving to and from a bed to a chair (including a wheelchair)?: Total Help needed standing up from a chair using your arms (e.g., wheelchair or bedside chair)?: Total Help needed to walk in hospital room?: Total Help needed climbing 3-5 steps with a railing? : Total 6 Click Score: 8    End of Session Equipment Utilized During Treatment: Oxygen (via trach collar) Activity Tolerance: Patient tolerated treatment well Patient left: in bed;with call bell/phone within reach;with bed alarm set Nurse Communication: Mobility status PT Visit Diagnosis: Muscle weakness (generalized) (M62.81);Difficulty in walking, not elsewhere classified (R26.2);Other symptoms and signs involving the nervous system (R29.898)     Time: 1002-1030 PT Time Calculation (min) (ACUTE ONLY): 28 min  Charges:  $Therapeutic Exercise: 8-22 mins $Therapeutic Activity: 8-22 mins                     Kittie Plater, PT, DPT Acute Rehabilitation Services Pager #: [phone] Office #: [phone]    Berline Lopes 11/20/2021, 12:29 PM

## 2021-11-20 NOTE — Consult Note (Signed)
[place] Nurse wound follow up Patient receiving care in [facility] 3W30 Follow up visit from original assessments on 10/30/21, 11/06/21, 63,89,37.  Sacral wounds are scattered with a total measuring area of 7.5 cm x 9.2 cm from intergluteal fold down to the anus and from left buttock to right buttock. (Butterfly shape all connected together) The proximal side of the intergluteal fold is 40% yellow, 50% pink with 10% in center of the wound black. Right buttock is 75% pink and 25% yellow. Left buttock is 90% yellow and 10% pink. Scant drainage. I would recommending continuation of the Santyl followed by moistened saline gauze and secured with foam dressing. Change daily.  The left elbow wound has healed but has some pink scar tissue. Left heel is stable with 2 areas of purple deep tissue PI. Right heel is pink and dry. Continue heel foam dressings and Prevalon heel lift boots. These were not on the patient but attached to the foot of the bed. I placed them back on the patients feet. Left arm outer layer of skin has completely sloughed off and the wound is now measuring 15 cm x 5 cm, has a yellow border, with 25% pink tissue and 75% black eschar. Will have PT to evaluate for hydrotherapy to get the adhered eschar off the surface.   WOC will follow weekly.  Please re-consult if needed.  Cathlean Marseilles Tamala Julian, MSN, RN, [place], Lysle Pearl, [facility] Wound Treatment Associate Pager [phone]

## 2021-11-20 NOTE — Care Management Important Message (Signed)
Important Message  Patient Details  Name: Jeffrey Mullen MRN: [id number] Date of Birth: [birth year]   Medicare Important Message Given:  Yes     Gentry Seeber Montine Circle 11/20/2021, 3:01 PM

## 2021-11-20 NOTE — Progress Notes (Addendum)
NAME:  Jeffrey Mullen, MRN:  [id number], DOB:  1951-04-06, LOS: 45 ADMISSION DATE:  10/23/2021, CONSULTATION DATE:  10/31 REFERRING MD:  Dr. Dina Rich, CHIEF COMPLAINT:  AMS   History of Present Illness:  70 y/o M who presented to [facility] ER on 10/31 via EMS with reports of altered mental status.  Given  right sided gaze, altered and left sided weakness, code stoke called. The patient was taken emergently to CT for imaging which was negative for LVO.  While in the [place], he had a witnessed seizure.    PCCM called for ICU admission.   Chart review shows he was seen in the ER on 10/4 after passing out at the [employer].  Physical exam was negative at the time.  Labs negative.  No further imaging obtained.   Pertinent  Medical History  Alcohol Use - unclear quantity Anxiety / Depression  Congenital blindness of left eye, cataract on R Hepatitis C+, treated, better in 2016 Seizures   Significant Hospital Events: Including procedures, antibiotic start and stop dates in addition to other pertinent events   10/31 Admit with AMS, seizure, hypertensive emergency, AKI 11/1 MRI brain no acute intracranial process. No etiology is seen for the patient's seizure. 11/02 bedside LP attempted but unsuccessful  11/03  No seizures seen on EEG overnight  11/04 weaning sedation to assess for seizure reoccurrence  11/9 bronchoscopy with noted thick secretions in LLL 11/10 Left PICC placed 11/11 Trach,  Cor Track placed, New Embolic Strokes per MRI 35/36 Post Trach Day 1, some oozing from site, Echo results noted below 11/13 Low grade fever, tachy, LFT's remain elevated with ALT and Alk Phos continuing to rise.  S/p lasix  11/14 weaned PSV 10/5, no changes, foley removed 11/15 on ATC, TEE attempted- unable to pass probe, briefly on Neo, cortrak removed fort TEE, tmax 102.3 11/17 afebrile, NG on cultures, continues to have copious secretions 11/20 fever trend is down, on Ancef 11/22: only tolerating  PSV trials for 3 hours a day; broadened antibiotics to zosyn/vanc due to fever spike; recultured   Last Blood Cx 11/8 Neg Last sputum Cx ( BAL) >> 11/9 >> No growth Tracheal Aspirate 11/8>> Normal [place] Trach asp 11/13 > ngtd 11/13 BC x1 >> ngtd 11/16 trach asp >> GPC and GNR; staph Aureus 11/16 Bcx 2 >> ngtd 11/22-trach culture>>MSSA. Resistant to erythro, clinda, 11/22-MRSA PCR>>neg  MRI Brain 11/11 Interval development of multiple scattered acute ischemic infarcts involving the bilateral cerebral hemispheres and left pons, largest measuring 4.9 cm at the right occipital lobe. A central thromboembolic etiology is suspected given the various vascular distributions involved.  Echo 11/11 ECHO reviewed  - Left ventricle: The cavity size was normal. There was moderate    concentric hypertrophy. Systolic function was normal. The    estimated ejection fraction was in the range of 60% to 65%. Wall    motion was normal; there were no regional wall motion    abnormalities. Features are consistent with a pseudonormal left    ventricular filling pattern, with concomitant abnormal relaxation    and increased filling pressure (grade 2 diastolic dysfunction).  - Aortic valve: There was no significant regurgitation.  - Mitral valve: There was no significant regurgitation.  - Left atrium: The atrium was mildly dilated.  - Right ventricle: Systolic function was normal.  - Atrial septum: No defect or patent foramen ovale was identified.  - Tricuspid valve: There was no significant regurgitation.  - Pulmonic valve: There was  no significant regurgitation.  - Inferior vena cava: The vessel was normal in size. The    respirophasic diameter changes were in the normal range (>= 50%),    consistent with normal central venous pressure.   RUQ limited US 11/13 > Heterogeneous increased hepatic parenchymal echogenicity typical of steatosis or other intrinsic hepatocellular disease. Nodular hepatic  contours suggesting cirrhosis.  Unremarkable sonographic appearance of the gallbladder and biliary tree.  Interim History / Subjective:  Appears comfortable   Objective   Blood pressure (Abnormal) 172/88, pulse (Abnormal) 109, temperature 98.2 F (36.8 C), temperature source Oral, resp. rate 20, height _0  (1.778 m), weight 73.7 kg, SpO2 97 %.    FiO2 (%):  [21 %] 21 %   Intake/Output Summary (Last 24 hours) at 11/20/2021 1159 Last data filed at 11/20/2021 0443 Gross per 24 hour  Intake 194.01 ml  Output 2700 ml  Net -2505.99 ml   Physical Exam: General this is a 70 year old male unresponsive Hent NCAT trach unremarkable  Pulm scattered rhonchi Card rrr Abd soft  Ext warm  Neuro now awake and interactive. Very weak. Confused. Will follow commands. Earlier I could not get him to interact with me with loud verbal stimulus. He is now getting wound care w/ PT and fully awake.   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 He remains very weak. His cough mechanics are poor. His mental status fluctuates from inattentive to confused Now awake and says he's "eating a ham" when I saw him earlier I could not get him to interact.   -not candidate for decannulation  Plan Cont routine trach care Pulse ox  Will need SNF   Erick Colace ACNP-BC [place] Pager # [phone] OR # [phone] if no answer

## 2021-11-20 NOTE — TOC Progression Note (Signed)
Transition of Care [facility]) - Progression Note    Patient Details  Name: Jeffrey Mullen MRN: [id number] Date of Birth: 10/17/1951  Transition of Care [facility]) CM/SW [place], [place] Phone Number: 11/20/2021, 1:45 PM  Clinical Narrative:     CSW followed up with APS worker, Marsh Dolly, to determine if pt has has had a Guardianship court date established yet. Marsh Dolly stated they do not, but will follow up when they do. TOC will continue to follow.   Expected Discharge Plan: Long Term Acute Care (LTAC) Barriers to Discharge: Continued Medical Work up  Expected Discharge Plan and Services Expected Discharge Plan: [place] (LTAC)   Discharge Planning Services: CM Consult   Living arrangements for the past 2 months: Apartment                                       Social Determinants of Health (SDOH) Interventions    Readmission Risk Interventions No flowsheet data found.

## 2021-11-20 NOTE — Progress Notes (Signed)
Physical Therapy Wound Treatment Patient Details  Name: Jeffrey Mullen MRN: [id number] Date of Birth: 12-07-1951  Today's Date: 11/20/2021 Time: 7035-0093 Time Calculation (min): 25 min  Subjective  Subjective Assessment Subjective: "i'm eating the whole ham" Patient and Family Stated Goals: pt unable to participate Date of Onset:  (unknown) Prior Treatments:  (unknown)  Pain Score:  0/10 with premedication  Wound Assessment  Wound / Incision (Open or Dehisced) 10/27/21 Skin tear Arm Left;Lower;Posterior torn blisters with open skin (Active)  Wound Image   11/20/21 1447  Dressing Type Gauze (Comment);Impregnated gauze (bismuth);Moist to dry;Non adherent 11/20/21 1447  Dressing Changed Changed 11/20/21 1447  Dressing Status Clean;Dry;Intact 11/20/21 1447  Dressing Change Frequency Daily 11/20/21 1447  Site / Wound Assessment Yellow;Black;Brown 11/20/21 1447  % Wound base Red or Granulating 5% 11/20/21 1447  % Wound base Yellow/Fibrinous Exudate 50% 11/20/21 1447  % Wound base Black/Eschar 45% 11/20/21 1447  % Wound base Other/Granulation Tissue (Comment) 0% 11/20/21 1447  Peri-wound Assessment Erythema (blanchable) 11/20/21 1447  Wound Length (cm) 15 cm 11/20/21 1447  Wound Width (cm) 6 cm 11/20/21 1447  Wound Depth (cm) 0 cm 11/20/21 1447  Wound Volume (cm^3) 0 cm^3 11/20/21 1447  Wound Surface Area (cm^2) 90 cm^2 11/20/21 1447  Margins Unattached edges (unapproximated) 11/20/21 1447  Closure None 11/17/21 0944  Drainage Amount Minimal 11/20/21 1447  Drainage Description Serous;Sanguineous 11/20/21 1447  Treatment Cleansed;Debridement (Selective);Hydrotherapy (Pulse lavage);Packing (Saline gauze) 11/20/21 1447      Hydrotherapy Pulsed lavage therapy - wound location: L dorsal forearm Pulsed Lavage with Suction (psi): 8 psi (4-12) Pulsed Lavage with Suction - Normal Saline Used: 1000 mL Pulsed Lavage Tip: Tip with splash shield Selective Debridement Selective  Debridement - Location: left dorsal forearm Selective Debridement - Tools Used: Forceps, Scalpel Selective Debridement - Tissue Removed: black to yellow eschar.    Wound Assessment and Plan  Wound Therapy - Assess/Plan/Recommendations Wound Therapy - Clinical Statement: pt will likely benefit well from pulsed lavage to soften the eschar for selective debridement and then cleanse the wound and decrease the bioburden Wound Therapy - Functional Problem List: significant debilitation and low mobility Factors Delaying/Impairing Wound Healing: Altered sensation, Immobility, Substance abuse, Tobacco use Hydrotherapy Plan: Debridement, Dressing change, Patient/family education, Pulsatile lavage with suction Wound Therapy - Frequency: 6X / week (then decrease to 3x/wk) Wound Therapy - Current Recommendations: WOC nurse Wound Therapy - Follow Up Recommendations: dressing changes by RN, Other (comment)  Wound Therapy Goals- Improve the function of patient's integumentary system by progressing the wound(s) through the phases of wound healing (inflammation - proliferation - remodeling) by: Wound Therapy Goals - Improve the function of patient's integumentary system by progressing the wound(s) through the phases of wound healing by: Decrease Necrotic Tissue to: 25% Decrease Necrotic Tissue - Progress: Goal set today Increase Granulation Tissue to: 75% Increase Granulation Tissue - Progress: Goal set today Goals/treatment plan/discharge plan were made with and agreed upon by patient/family: No, Patient unable to participate in goals/treatment/discharge plan and family unavailable Time For Goal Achievement: 7 days Wound Therapy - Potential for Goals: Good  Goals will be updated until maximal potential achieved or discharge criteria met.  Discharge criteria: when goals achieved, discharge from hospital, MD decision/surgical intervention, no progress towards goals, refusal/missing three consecutive  treatments without notification or medical reason.  GP     Charges PT Wound Care Charges $Wound Debridement up to 20 cm: < or equal to 20 cm $ Wound Debridement each add'l 20 sqcm: 4 $  PT PLS Gun and Tip: 1 Supply $PT Hydrotherapy Visit: 1 Visit       Tessie Fass Keila Turan 11/20/2021, 3:06 PM 11/20/2021  Ginger Carne., PT Acute Rehabilitation Services [phone]  (pager) [phone]  (office)

## 2021-11-20 NOTE — Progress Notes (Signed)
[facility] PROGRESS NOTE   Jeffrey Mullen WGN:[id number] DOB: [birth year] DOA: 10/23/2021  PCP: [name], NP  Brief History/Interval Summary: 70 y/o M with past medical history of hypertension, glaucoma who presented to [facility] ER on 10/31 via EMS with reports of altered mental status.  Given  right sided gaze, altered and left sided weakness, code stoke called. The patient was taken emergently to CT for imaging which was negative for LVO.  While in the [place], he had a witnessed seizure.  PCCM called for ICU admission. Chart review shows he was seen in the ER on 10/4 after passing out at the [employer].  Physical exam was negative at the time.  Labs negative.  No further imaging obtained.    Consultants: Neurology.  Critical care medicine  Significant Hospital Events: Including procedures, antibiotic start and stop dates in addition to other pertinent events   10/31 Admit with AMS, seizure, hypertensive emergency, AKI 11/1 MRI brain no acute intracranial process. No etiology is seen for the patient's seizure. 11/02 bedside LP attempted but unsuccessful  11/03  No seizures seen on EEG overnight  11/04 weaning sedation to assess for seizure reoccurrence  11/9 bronchoscopy with noted thick secretions in LLL 11/10 Left PICC placed 11/11 Trach,  Cor Track placed, New Embolic Strokes per MRI 78/46 Post Trach Day 1, some oozing from site, Echo results noted below 11/13 Low grade fever, tachy, LFT's remain elevated with ALT and Alk Phos continuing to rise.  S/p lasix  11/14 weaned PSV 10/5, no changes, foley removed 11/15 on ATC, TEE attempted- unable to pass probe, briefly on Neo, cortrak removed fort TEE, tmax 102.3 11/17 afebrile, NG on cultures, continues to have copious secretions 11/20 fever trend is down, on Ancef 11/22: only tolerating PSV trials for 3 hours a day; broadened antibiotics to zosyn/vanc due to fever spike; recultured     Last Blood Cx 11/8 Neg Last  sputum Cx ( BAL) >> 11/9 >> No growth Tracheal Aspirate 11/8>> Normal Flora Trach asp 11/13 > ngtd 11/13 BC x1 >> ngtd 11/16 trach asp >> GPC and GNR; staph Aureus 11/16 Bcx 2 >> ngtd 11/22-trach culture>>MSSA. Resistant to erythro, clinda, 11/22-MRSA PCR>>neg   Subjective/Interval History: Patient noted to be more awake and alert this morning.  Responding appropriately to commands and questions.  Has difficulty communicating due to tracheostomy.     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  Patient was seen by neurology.   Transthoracic echocardiogram showed normal systolic function.  Grade 2 diastolic dysfunction was noted.  No embolic source was identified.  TEE was attempted but could not be performed successfully. LDL noted to be 66.  HbA1c 6.0.  Patient is on statin. Currently on aspirin.   Noted to be quite deconditioned.  PT and OT.  Status epilepticus Resolved.  No seizure activity noted on EEG.  Patient remains on Keppra and lacosamide.  Continue to monitor.  Acute metabolic encephalopathy Encephalopathy is likely secondary to all of his acute issues including epilepsy and stroke and acute respiratory failure. Patient was noted to be confused yesterday.  But noted to be better this morning.  Responding appropriately to commands.  Trying to answer some questions.  Continue to monitor for now.   Sepsis likely secondary to tracheitis Trach cultures were positive for MSSA.  Patient was started initially on Zosyn then changed over to Ancef and then changed back to vancomycin and Zosyn due to development of  fever.   Sepsis physiology appears to be improving.  Plan is to treat him with vancomycin and Zosyn for 7 days.  Last date will be 11/29.  Left cephalic vein superficial thrombophlebitis Noted to have upper extremity superficial vein thrombophlebitis which could have also contributed to fever.  Warm compresses.  Aspirin.  Acute  respiratory failure with hypoxia requiring mechanical ventilation/status post tracheostomy Underwent tracheostomy on 11/11.  Required mechanical ventilation.  Currently saturating reasonably well on trach collar.  Pulmonary toilet. Trach care per PCCM.  Hypernatremia Likely due to free water deficit.  Sodium level now up to 152.  Free water supplementation was increased to every 6 hours yesterday.  We will increase to every 4 hours and recheck labs tomorrow.   Previously has required D5 infusion.  Would like to try enteral correction for now  Essential hypertension Currently on multiple antihypertensives including amlodipine, carvedilol, clonidine and irbesartan.   Blood pressure reasonably well controlled for the most part.  Occasional high readings noted.    Anemia of critical illness Hemoglobin stable.  No evidence of overt bleeding.  Hypokalemia Stable.  Left arm extravasation injury Status post phentolamine and nitroglycerin application.  Wound care.  Liver cirrhosis LFTs abnormal but stable.  Right upper quadrant ultrasound showed evidence for liver cirrhosis.  Unremarkable appearance of gallbladder and biliary tree was noted.  Acute kidney injury due to contrast-induced ATN Resolved.  Monitor urine output.  Nontraumatic rhabdomyolysis Resolved  Goals of care Palliative medicine has been following.  They had been communicating with patient's former significant other.  Apparently this person requested that she no longer wanted to be the surrogate decision maker.  Previously his biological daughter has also mentioned that she does not want to be the primary decision maker.  Social worker to help conduct search for alternative family members/next of kin. After discussions with critical care medicine and with two-physician documentation patient's CODE STATUS was changed to DNR.  See note from 11/27.   DVT Prophylaxis: Subcutaneous heparin Code Status: Full code Family  Communication: None at bedside Disposition Plan: Unclear disposition at this time.  Status is: Inpatient  Remains inpatient appropriate because: Tracheostomy, altered mental status     Medications: Scheduled:  albuterol  2.5 mg Nebulization TID   amLODipine  10 mg Per Tube Daily   aspirin  81 mg Per Tube Daily   atorvastatin  40 mg Per Tube Daily   carvedilol  12.5 mg Per Tube BID WC   chlorhexidine  15 mL Mouth Rinse BID   Chlorhexidine Gluconate Cloth  6 each Topical Daily   cloNIDine  0.1 mg Per Tube TID   collagenase   Topical BID   dorzolamide-timolol  1 drop Right Eye BID   feeding supplement (PROSource TF)  45 mL Per Tube TID   feeding supplement (VITAL 1.5 CAL)  1,000 mL Per Tube V78H   folic acid  1 mg Per Tube Daily   free water  300 mL Per Tube Q6H   heparin  5,000 Units Subcutaneous Q8H   insulin aspart  0-15 Units Subcutaneous Q4H   irbesartan  75 mg Per Tube Daily   lacosamide  200 mg Per Tube BID   latanoprost  1 drop Right Eye QHS   levETIRAcetam  500 mg Per Tube BID   mouth rinse  15 mL Mouth Rinse q12n4p   multivitamin with minerals  1 tablet Per Tube Daily   nutrition supplement (JUVEN)  1 packet Per Tube BID BM  pantoprazole sodium  40 mg Per Tube QHS   scopolamine  1 patch Transdermal Q72H   sodium chloride flush  10-40 mL Intracatheter Q12H   thiamine  100 mg Per Tube Daily   Continuous:  sodium chloride Stopped (11/07/21 0016)   sodium chloride 5 mL/hr at 11/19/21 0949   piperacillin-tazobactam (ZOSYN)  IV 3.375 g (11/20/21 0541)   vancomycin 150 mL/hr at 11/20/21 0029   XYI:AXKPVV chloride, acetaminophen (TYLENOL) oral liquid 160 mg/5 mL, docusate, fentaNYL (SUBLIMAZE) injection, guaiFENesin, hydrALAZINE, ipratropium-albuterol, labetalol, loperamide HCl, polyethylene glycol, sodium chloride flush  Antibiotics: Anti-infectives (From admission, onward)    Start     Dose/Rate Route Frequency Ordered Stop   11/19/21 0000  vancomycin (VANCOREADY)  IVPB 750 mg/150 mL        750 mg 150 mL/hr over 60 Minutes Intravenous Every 12 hours 11/18/21 1335 11/20/21 2359   11/15/21 0000  vancomycin (VANCOREADY) IVPB 1250 mg/250 mL  Status:  Discontinued        1,250 mg 166.7 mL/hr over 90 Minutes Intravenous Every 12 hours 11/14/21 1116 11/18/21 1335   11/14/21 1400  piperacillin-tazobactam (ZOSYN) IVPB 3.375 g        3.375 g 12.5 mL/hr over 240 Minutes Intravenous Every 8 hours 11/14/21 1245 11/20/21 2359   11/14/21 1215  piperacillin-tazobactam (ZOSYN) IVPB 3.375 g  Status:  Discontinued        3.375 g 12.5 mL/hr over 240 Minutes Intravenous Every 8 hours 11/14/21 1116 11/14/21 1245   11/14/21 1215  vancomycin (VANCOREADY) IVPB 1750 mg/350 mL        1,750 mg 175 mL/hr over 120 Minutes Intravenous  Once 11/14/21 1116 11/14/21 1424   11/11/21 2000  ceFAZolin (ANCEF) IVPB 2g/100 mL premix  Status:  Discontinued        2 g 200 mL/hr over 30 Minutes Intravenous Every 8 hours 11/11/21 1404 11/14/21 1116   11/09/21 2330  piperacillin-tazobactam (ZOSYN) IVPB 3.375 g  Status:  Discontinued        3.375 g 12.5 mL/hr over 240 Minutes Intravenous Every 8 hours 11/09/21 1659 11/11/21 1404   11/09/21 1745  piperacillin-tazobactam (ZOSYN) IVPB 3.375 g        3.375 g 100 mL/hr over 30 Minutes Intravenous  Once 11/09/21 1659 11/09/21 1819   11/03/21 2100  cefTRIAXone (ROCEPHIN) 2 g in sodium chloride 0.9 % 100 mL IVPB  Status:  Discontinued        2 g 200 mL/hr over 30 Minutes Intravenous Every 24 hours 11/03/21 1533 11/05/21 1056   10/31/21 0930  Ampicillin-Sulbactam (UNASYN) 3 g in sodium chloride 0.9 % 100 mL IVPB  Status:  Discontinued        3 g 200 mL/hr over 30 Minutes Intravenous Every 6 hours 10/31/21 0837 11/03/21 1533   10/27/21 2200  linezolid (ZYVOX) IVPB 600 mg  Status:  Discontinued        600 mg 300 mL/hr over 60 Minutes Intravenous Every 12 hours 10/27/21 1650 10/28/21 1635   10/27/21 1045  cefTRIAXone (ROCEPHIN) 2 g in sodium chloride  0.9 % 100 mL IVPB  Status:  Discontinued        2 g 200 mL/hr over 30 Minutes Intravenous Every 24 hours 10/27/21 0957 10/31/21 0837   10/27/21 1045  azithromycin (ZITHROMAX) 500 mg in sodium chloride 0.9 % 250 mL IVPB  Status:  Discontinued        500 mg 250 mL/hr over 60 Minutes Intravenous Every 24 hours 10/27/21 0957 10/30/21 1121  10/26/21 1000  acyclovir (ZOVIRAX) 795 mg in dextrose 5 % 150 mL IVPB  Status:  Discontinued        10 mg/kg  79.3 kg 165.9 mL/hr over 60 Minutes Intravenous Every 12 hours 10/26/21 0825 10/26/21 0900   10/26/21 1000  acyclovir (ZOVIRAX) 800 mg in dextrose 5 % 150 mL IVPB  Status:  Discontinued        800 mg 166 mL/hr over 60 Minutes Intravenous Every 12 hours 10/26/21 0900 10/28/21 0722   10/24/21 1445  acyclovir (ZOVIRAX) 795 mg in dextrose 5 % 150 mL IVPB  Status:  Discontinued        10 mg/kg  79.3 kg 165.9 mL/hr over 60 Minutes Intravenous Every 8 hours 10/24/21 1345 10/26/21 0825       Objective:  Vital Signs  Vitals:   11/20/21 0400 11/20/21 0408 11/20/21 0718 11/20/21 0845  BP:  (!) 155/86 (!) 172/88   Pulse: 94 95 (!) 101 (!) 109  Resp: _0 Temp:  98 F (36.7 C) 98.2 F (36.8 C)   TempSrc:  Axillary Oral   SpO2: 97% 98% 96% 97%  Weight:      Height:        Intake/Output Summary (Last 24 hours) at 11/20/2021 0901 Last data filed at 11/20/2021 0443 Gross per 24 hour  Intake 194.01 ml  Output 2700 ml  Net -2505.99 ml    Filed Weights   11/16/21 0500 11/17/21 0408 11/18/21 0423  Weight: 82.8 kg 77.3 kg 73.7 kg    General appearance: Awake alert.  In no distress Tracheostomy and NG tube noted. Resp: Normal effort at rest.  Coarse breath sounds bilaterally.  Few crackles at the bases. Cardio: S1-S2 is normal regular.  No S3-S4.  No rubs murmurs or bruit GI: Abdomen is soft.  Nontender nondistended.  Bowel sounds are present normal.  No masses organomegaly Extremities: No edema.  Significant physical deconditioning  is noted. Neurologic:  No focal neurological deficits.     Lab Results:  Data Reviewed: I have personally reviewed following labs and imaging studies  CBC: Recent Labs  Lab 11/14/21 0331 11/15/21 0339 11/16/21 0234 11/17/21 0253 11/20/21 0147  WBC 9.0 10.8* 10.9* 10.3 11.5*  HGB 7.6* 8.1* 8.2* 8.5* 10.7*  HCT 23.5* 25.4* 26.3* 26.5* 34.7*  MCV 87.0 87.6 88.3 88.6 90.8  PLT 396 443* 475* 454* 421*     Basic Metabolic Panel: Recent Labs  Lab 11/14/21 0331 11/15/21 0339 11/16/21 0234 11/17/21 0253 11/18/21 0302 11/19/21 0146 11/20/21 0147  NA 145 147* 146* 147*  148* 147* 151* 152*  K 3.9 3.7 3.5 3.7  3.8 3.5 3.6 4.3  CL 110 112* 112* 115*  115* 114* 119* 122*  CO2 _1 21*  GLUCOSE 158* 151* 149* 166*  165* 137* 151* 128*  BUN 25* 28* 32* 32*  32* 30* 32* 27*  CREATININE 0.86 0.90 0.94 0.96  0.94 0.77 0.97 0.84  CALCIUM 9.4 9.1 9.2 9.0  9.0 9.4 9.4 9.3  MG 2.2  --   --   --   --   --  2.3  PHOS 3.4 3.5 3.6 4.0 3.7 3.5  --      GFR: Estimated Creatinine Clearance: 88.1 mL/min (by C-G formula based on SCr of 0.84 mg/dL).  Liver Function Tests: Recent Labs  Lab 11/14/21 0331 11/15/21 0339 11/16/21 0234 11/17/21 0253 11/18/21 0302 11/19/21 0146 11/20/21 0147  AST 94*  --   --  122*  --   --  117*  ALT 87*  --   --  119*  --   --  168*  ALKPHOS 371*  --   --  330*  --   --  321*  BILITOT 0.4  --   --  0.7  --   --  0.7  PROT 7.3  --   --  7.6  --   --  8.0  ALBUMIN 2.0*  2.0*   < > 2.1* 2.2*  2.2* 2.3* 2.4* 2.5*   < > = values in this interval not displayed.      CBG: Recent Labs  Lab 11/19/21 1614 11/19/21 2028 11/20/21 0009 11/20/21 0352 11/20/21 0838  GLUCAP 159* 177* 126* 156* 100*      Recent Results (from the past 240 hour(s))  Culture, Respiratory w Gram Stain     Status: None   Collection Time: 11/14/21 11:45 AM   Specimen: Tracheal Aspirate; Respiratory  Result Value Ref Range Status   Specimen  Description TRACHEAL ASPIRATE  Final   Special Requests NONE  Final   Gram Stain   Final    RARE WBC PRESENT, PREDOMINANTLY PMN RARE GRAM POSITIVE COCCI IN PAIRS Performed at [facility], [place] [address], [place], [place] [address]    Culture FEW STAPHYLOCOCCUS AUREUS  Final   Report Status 11/16/2021 FINAL  Final   Organism ID, Bacteria STAPHYLOCOCCUS AUREUS  Final      Susceptibility   Staphylococcus aureus - MIC*    CIPROFLOXACIN <=0.5 SENSITIVE Sensitive     ERYTHROMYCIN >=8 RESISTANT Resistant     GENTAMICIN <=0.5 SENSITIVE Sensitive     OXACILLIN 0.5 SENSITIVE Sensitive     TETRACYCLINE <=1 SENSITIVE Sensitive     VANCOMYCIN 1 SENSITIVE Sensitive     TRIMETH/SULFA <=10 SENSITIVE Sensitive     CLINDAMYCIN RESISTANT Resistant     RIFAMPIN <=0.5 SENSITIVE Sensitive     Inducible Clindamycin POSITIVE Resistant     * FEW STAPHYLOCOCCUS AUREUS  MRSA Next Gen by PCR, Nasal     Status: None   Collection Time: 11/15/21  8:00 AM   Specimen: Nasal Mucosa; Nasal Swab  Result Value Ref Range Status   MRSA by PCR Next Gen NOT DETECTED NOT DETECTED Final    Comment: (NOTE) The GeneXpert MRSA Assay (FDA approved for NASAL specimens only), is one component of a comprehensive MRSA colonization surveillance program. It is not intended to diagnose MRSA infection nor to guide or monitor treatment for MRSA infections. Test performance is not FDA approved in patients less than 73 years old. Performed at [facility], [place] [address] [place], [place] [address]        Radiology Studies: No results found.     LOS: 28 days   Breyah Akhter [employer] on [email or web address]  11/20/2021, 9:01 AM

## 2021-11-21 ENCOUNTER — Telehealth: Payer: Self-pay | Admitting: Nurse Practitioner

## 2021-11-21 DIAGNOSIS — Z930 Tracheostomy status: Secondary | ICD-10-CM | POA: Diagnosis not present

## 2021-11-21 DIAGNOSIS — J9601 Acute respiratory failure with hypoxia: Secondary | ICD-10-CM | POA: Diagnosis not present

## 2021-11-21 LAB — BASIC METABOLIC PANEL
Anion gap: 10 (ref 5–15)
BUN: 36 mg/dL — ABNORMAL HIGH (ref 8–23)
CO2: 21 mmol/L — ABNORMAL LOW (ref 22–32)
Calcium: 9.6 mg/dL (ref 8.9–10.3)
Chloride: 124 mmol/L — ABNORMAL HIGH (ref 98–111)
Creatinine, Ser: 0.93 mg/dL (ref 0.61–1.24)
GFR, Estimated: >60 mL/min (ref >60)
Glucose, Bld: 130 mg/dL — ABNORMAL HIGH (ref 70–99)
Potassium: 4.2 mmol/L (ref 3.5–5.1)
Sodium: 155 mmol/L — ABNORMAL HIGH (ref 135–145)

## 2021-11-21 LAB — GLUCOSE, CAPILLARY
Glucose-Capillary: 116 mg/dL — ABNORMAL HIGH (ref 70–99)
Glucose-Capillary: 131 mg/dL — ABNORMAL HIGH (ref 70–99)
Glucose-Capillary: 133 mg/dL — ABNORMAL HIGH (ref 70–99)
Glucose-Capillary: 141 mg/dL — ABNORMAL HIGH (ref 70–99)
Glucose-Capillary: 143 mg/dL — ABNORMAL HIGH (ref 70–99)
Glucose-Capillary: 146 mg/dL — ABNORMAL HIGH (ref 70–99)
Glucose-Capillary: 151 mg/dL — ABNORMAL HIGH (ref 70–99)

## 2021-11-21 MED ORDER — NUTRISOURCE FIBER PO PACK
1.0000 | PACK | Freq: Two times a day (BID) | ORAL | Status: DC
  Administered 2021-11-21 – 2021-12-04 (×28): 1
  Filled 2021-11-21 (×29): qty 1

## 2021-11-21 MED ORDER — DEXTROSE 5 % IV SOLN
INTRAVENOUS | Status: AC
Start: 2021-11-21 — End: 2021-11-22

## 2021-11-21 MED ORDER — FREE WATER
300.0000 mL | Status: DC
  Administered 2021-11-21 – 2021-12-03 (×72): 300 mL

## 2021-11-21 MED ORDER — SACCHAROMYCES BOULARDII 250 MG PO CAPS
250.0000 mg | ORAL_CAPSULE | Freq: Two times a day (BID) | ORAL | Status: DC
  Administered 2021-11-21 – 2021-12-04 (×28): 250 mg
  Filled 2021-11-21 (×28): qty 1

## 2021-11-21 MED ORDER — NICOTINE 14 MG/24HR TD PT24
14.0000 mg | MEDICATED_PATCH | Freq: Every day | TRANSDERMAL | Status: DC
  Administered 2021-11-22 – 2021-12-14 (×20): 14 mg via TRANSDERMAL
  Filled 2021-11-21 (×23): qty 1

## 2021-11-21 NOTE — Progress Notes (Signed)
CSW spoke with [name] at [facility] to inform her of patient's transition to DTP Team. As of now, Jeffrey Mullen has not filed for guardianship as she is attempting to reach relatives of the patient to determine if they would be willing to accept responsibility of decision making.  Madilyn Fireman, MSW, LCSW Transitions of Care  Clinical Social Worker II [phone]

## 2021-11-21 NOTE — Telephone Encounter (Signed)
Copied from [place] [phone]. Topic: General - Other >> Nov 13, 2021  1:28 PM Loma [place] wrote: Young Harris, notifying that pt is still in [facility] been in since 10/31 Caller did not wish to share any other info [phone]

## 2021-11-21 NOTE — Progress Notes (Signed)
Physical Therapy Wound Treatment Patient Details  Name: Jeffrey Mullen MRN: [id number] Date of Birth: 04/11/1951  Today's Date: 11/21/2021 Time: 1005-1045 Time Calculation (min): 40 min  Subjective  Subjective Assessment Subjective: "i'm eating the whole ham" Patient and Family Stated Goals: pt unable to participate Date of Onset:  (unknown) Prior Treatments:  (unknown)  Pain Score:  1/10  premedication  Wound Assessment     Wound / Incision (Open or Dehisced) 10/27/21 Skin tear Arm Left;Lower;Posterior torn blisters with open skin (Active)  Wound Image   11/20/21 1447  Dressing Type Gauze (Comment);Moist to dry;Non adherent;Normal saline moist dressing;Santyl 11/21/21 1230  Dressing Changed Changed 11/21/21 1230  Dressing Status Clean;Dry;Intact 11/21/21 1230  Dressing Change Frequency Daily 11/21/21 1230  Site / Wound Assessment Clean;Red;Purple;Friable 11/21/21 1230  % Wound base Red or Granulating 15% 11/21/21 1230  % Wound base Yellow/Fibrinous Exudate 30% 11/21/21 1230  % Wound base Black/Eschar 55% 11/21/21 1230  % Wound base Other/Granulation Tissue (Comment) 0% 11/21/21 1230  Peri-wound Assessment Erythema (blanchable);Purple 11/21/21 1230  Wound Length (cm) 15 cm 11/20/21 1447  Wound Width (cm) 6 cm 11/20/21 1447  Wound Depth (cm) 0 cm 11/20/21 1447  Wound Volume (cm^3) 0 cm^3 11/20/21 1447  Wound Surface Area (cm^2) 90 cm^2 11/20/21 1447  Margins Unattached edges (unapproximated) 11/21/21 1230  Closure None 11/21/21 1230  Drainage Amount Scant 11/21/21 1230  Drainage Description Serous;Sanguineous 11/21/21 1230  Treatment Cleansed 11/21/21 1230      Hydrotherapy Pulsed lavage therapy - wound location: L dorsal forearm Pulsed Lavage with Suction (psi): 8 psi (4-12) Pulsed Lavage with Suction - Normal Saline Used: 1000 mL Pulsed Lavage Tip: Tip with splash shield Selective Debridement Selective Debridement - Location: left dorsal forearm Selective  Debridement - Tools Used: Forceps, Scalpel Selective Debridement - Tissue Removed: black to yellow eschar.    Wound Assessment and Plan  Wound Therapy - Assess/Plan/Recommendations Wound Therapy - Clinical Statement: pt will likely benefit well from pulsed lavage to soften the eschar for selective debridement and then cleanse the wound and decrease the bioburden Wound Therapy - Functional Problem List: significant debilitation and low mobility Factors Delaying/Impairing Wound Healing: Altered sensation, Immobility, Substance abuse, Tobacco use Hydrotherapy Plan: Debridement, Dressing change, Patient/family education, Pulsatile lavage with suction Wound Therapy - Frequency: 6X / week (then decrease to 3x/wk) Wound Therapy - Current Recommendations: WOC nurse Wound Therapy - Follow Up Recommendations: dressing changes by RN, Other (comment)  Wound Therapy Goals- Improve the function of patient's integumentary system by progressing the wound(s) through the phases of wound healing (inflammation - proliferation - remodeling) by: Wound Therapy Goals - Improve the function of patient's integumentary system by progressing the wound(s) through the phases of wound healing by: Decrease Necrotic Tissue to: 25% Decrease Necrotic Tissue - Progress: Progressing toward goal Increase Granulation Tissue to: 75% Increase Granulation Tissue - Progress: Progressing toward goal Goals/treatment plan/discharge plan were made with and agreed upon by patient/family: No, Patient unable to participate in goals/treatment/discharge plan and family unavailable Time For Goal Achievement: 7 days Wound Therapy - Potential for Goals: Good  Goals will be updated until maximal potential achieved or discharge criteria met.  Discharge criteria: when goals achieved, discharge from hospital, MD decision/surgical intervention, no progress towards goals, refusal/missing three consecutive treatments without notification or medical  reason.  GP     Charges PT Wound Care Charges $Wound Debridement up to 20 cm: < or equal to 20 cm $ Wound Debridement each add'l 20 sqcm: 4 $PT   PLS Gun and Tip: 1 Supply $PT Hydrotherapy Visit: 1 Visit       Kenneth V Mottinger 11/21/2021, 12:35 PM   

## 2021-11-21 NOTE — Progress Notes (Signed)
Speech Language Pathology Treatment: Dysphagia;Cognitive-Linquistic;Passy Muir Speaking valve  Patient Details Name: Jeffrey Mullen MRN: [id number] DOB: 03/22/1951 Today's Date: 11/21/2021 Time: 0263-7858 SLP Time Calculation (min) (ACUTE ONLY): 26 min  Assessment / Plan / Recommendation Clinical Impression  Pt was seen for therapy targeting PMV speech intelligibility, cognition, and swallowing goals. Pt awake and alert; cuff deflated upon arrival and SLP donned PMV, noting no significant changes in sats (HR 93-94, SpO2 100, RR 14-22) throughout session. Pt tolerated PMV for 26 minutes with no evidence of back pressure. With PMV donned, pt speech intelligibility and vocal intensity improved from last targeted session, further benefiting from verbal cues to increase volume. Pt oriented to person and elements of place, however required education to orient to time and situation. Pt recalled education at end of session after ~10 minutes independently. Pt awareness improving, increasingly incorporating speech intelligibility strategies independently throughout session. Pt made several confused comments re: environment.   SLP completed oral care and trialed ice chips, thin liquid from teaspoon and cup, and puree solids. Pt exhibited no oral phase impairments across consistencies. Pharyngeally, pt exhibited immediate cough with thin liquid from cup but no overt s/s of penetration or aspiration across all other presentations. SLP plan to follow with FEES tomorrow to objectively view swallowing mechanism d/t deconditioning and pharyngeal symptoms with thin liquids. Recommend NPO, administering medications via alternate means, until results from instrumental study are rendered.   HPI HPI: 70 y/o male presented to ED on 10/23/21 after bystanders witnessed patient collapse with associated seizure. Another seizure witnessed in ED. CT head negative. CT C-spine negative. LTM EEG on 11/1 showed 2 seizures from R  anterior temporal region. Last seizure noted on EEG on 11/3. Intubated 10/31-11/11. Trach placed 11/11. 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  Recommendations may be updated based on patient status, additional functional criteria and insurance authorization.    Recommendations  Diet recommendations: NPO Medication Administration: Via alternative means      Patient may use Passy-Muir Speech Valve: During all therapies with supervision PMSV Supervision: Full MD: Please consider changing trach tube to : Cuffless         General recommendations: Rehab consult Oral Care Recommendations: Oral care QID Follow Up Recommendations: Skilled nursing-short term rehab (<3 hours/day) Assistance recommended at discharge: Frequent or constant Supervision/Assistance SLP Visit Diagnosis: Aphonia (R49.1);Cognitive communication deficit [phone]) Plan: Continue with current plan of care       GO              [name], SLP-Student   [name]  11/21/2021, 10:42 AM

## 2021-11-21 NOTE — Progress Notes (Signed)
[facility] PROGRESS NOTE  Jeffrey Mullen ION:[id number] DOB: May 07, 1951 DOA: 10/23/2021 PCP: [name], NP  Status: Remains inpatient appropriate because:  Unsafe discharge plan; currently not medically stable  Barriers to discharge: Social: Patient unable to independently make decisions regarding medical care-guardianship process pending  Clinical: Core track in place, trach tube in place, significantly impaired mobility secondary to right hemiparesis  Level of care:  Progressive   Code Status: DNR Family Communication: Has no family available to assist.  Girlfriend also unable to assist with patient after discharge. DVT prophylaxis: Subcutaneous heparin COVID vaccination status: 02/18/2020, 04/25/2020 and 11/11/2020  HPI: 70 y/o M with past medical history of hypertension, glaucoma who presented to [facility] ER on 10/31 via EMS with reports of altered mental status.  Given  right sided gaze, altered and left sided weakness, code stoke called. The patient was taken emergently to CT for imaging which was negative for LVO.  While in the [place], he had a witnessed seizure.  PCCM called for ICU admission. Chart review shows he was seen in the ER on 10/4 after passing out at the [employer].  Physical exam was negative at the time.  Labs negative.  No further imaging obtained.    Subjective: Alert and working with therapy.  Requesting ice cream.  Pleasant affect.  Objective: Vitals:   11/21/21 0500 11/21/21 0757  BP: (!) 142/70 138/80  Pulse: 92 99  Resp: 14 19  Temp: 98.6 F (37 C) 99.2 F (37.3 C)  SpO2: 97% 99%    Intake/Output Summary (Last 24 hours) at 11/21/2021 0805 Last data filed at 11/21/2021 0216 Gross per 24 hour  Intake --  Output 750 ml  Net -750 ml   Filed Weights   11/16/21 0500 11/17/21 0408 11/18/21 0423  Weight: 82.8 kg 77.3 kg 73.7 kg    Exam:  Constitutional: NAD, calm, comfortable Respiratory: 6.0 cuffed trach 5 L/room air with  100% O2 saturation, clear to auscultation bilaterally, no wheezing, no crackles. Normal respiratory effort. No accessory muscle use.  Strong cough noted Cardiovascular: Regular rate and rhythm, no murmurs / rubs / gallops. No extremity edema.  Normotensive Abdomen: no tenderness, no masses palpated. Bowel sounds positive.  Cortrack tube in place.  Rectal tube in place with high volume liquid brown stool  Skin: Left arm wound currently covered with dressing which is clean dry and intact Neurologic: CN 2-12 grossly intact. Sensation intact, Strength 3-4/5 on left, marked decrease strength on right: Arm is 2/5 and leg is 3/5 Psychiatric: Alert and appropriately interactive.  Able to vocalize and follow simple commands  Assessment/Plan: Acute problems: Acute stroke Found to have multifocal bilateral embolic stroke.  Patient was seen by neurology.   Transthoracic echocardiogram showed normal systolic function.  Grade 2 diastolic dysfunction was noted.  No embolic source was identified.  TEE was attempted but could not be performed successfully. LDL noted to be 66.  HbA1c 6.0.  Patient is on statin. Currently on aspirin.   Noted to be quite deconditioned.  PT and OT.   Status epilepticus Resolved.  No seizure activity noted on EEG.  Patient remains on Keppra and lacosamide.  Continue to monitor.   Acute metabolic encephalopathy Encephalopathy is likely secondary to all of his acute issues including epilepsy and stroke and acute respiratory failure. Patient was noted to be confused yesterday.  But noted to be better this morning.  Responding appropriately to commands.  Trying to answer some questions.  Continue to monitor  for now.    Sepsis likely secondary to tracheitis Trach cultures were positive for MSSA.   Treated with Zosyn >> Ancef followed by vancomycin and Zosyn due to development of fever.   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   Continue local wound care.      10/23/2021                  11/20/2021   Acute respiratory failure with hypoxia requiring mechanical ventilation/status post tracheostomy Underwent tracheostomy on 11/11.  Required mechanical ventilation.  Currently saturating reasonably well on trach collar.  Pulmonary toilet. Trach care per PCCM.   Hypernatremia/high volume stool output 2/2 free H20 deficit   11/29: Na+ 155 so free H2O increased to300 cc q 4hrs Likely from large volume diarrhea so have added florastor and fiber per tube May need Imodium as well No fevers and only mild leukocytosis in context of acute MSSA tracheitis.  Has been on vancomycin and Zosyn so if develops abdominal pain, fevers and/or worsening leukocytosis may need to rule out C. difficile   Essential hypertension Continue amlodipine, carvedilol, clonidine and irbesartan.   Blood pressure essentially controlled  Anemia of critical illness Hemoglobin 10.7 on 11/28   Hypokalemia Resolved with potassium 4.2 as of 39/03   Alcoholic liver cirrhosis PCP notes document history of alcohol abuse and known cirrhosis LFTs stable.  RUQ ultrasound c/w cirrhosis.  Unremarkable appearance of gallbladder and biliary tree was noted. Check ammonia level-given current high-volume diarrhea be a candidate for lactulose at this juncture  Acute kidney injury due to contrast-induced ATN Resolved.   Current creatinine 0.93  Nontraumatic rhabdomyolysis Resolved   Goals of care Palliative medicine has been following.  They had been communicating with patient's former significant other.  Apparently this person requested that she no longer wanted to be  the surrogate decision maker.  Previously his biological daughter has also mentioned that she does not want to be the primary decision maker.  Social worker to help conduct search for alternative family members/next of kin. After discussions with critical care medicine and with two-physician documentation patient's CODE STATUS was changed to DNR.  See note from 11/27.    Data Reviewed: Basic Metabolic Panel: Recent Labs  Lab 11/15/21 0339 11/16/21 0234 11/17/21 0253 11/18/21 0302 11/19/21 0146 11/20/21 0147 11/21/21 0149  NA 147* 146* 147*  148* 147* 151* 152* 155*  K 3.7 3.5 3.7  3.8 3.5 3.6 4.3 4.2  CL 112* 112* 115*  115* 114* 119* 122* 124*  CO2 27 26 24  24 24 23  21* 21*  GLUCOSE 151* 149* 166*  165* 137* 151* 128* 130*  BUN 28* 32* 32*  32* 30* 32* 27* 36*  CREATININE 0.90 0.94 0.96  0.94 0.77 0.97 0.84 0.93  CALCIUM 9.1 9.2 9.0  9.0 9.4 9.4 9.3 9.6  MG  --   --   --   --   --  2.3  --   PHOS 3.5 3.6 4.0 3.7 3.5  --   --    Liver Function Tests: Recent Labs  Lab 11/16/21 0234 11/17/21 0253 11/18/21 0302 11/19/21 0146 11/20/21 0147  AST  --  122*  --   --  117*  ALT  --  119*  --   --  168*  ALKPHOS  --  330*  --   --  321*  BILITOT  --  0.7  --   --  0.7  PROT  --  7.6  --   --  8.0  ALBUMIN 2.1* 2.2*  2.2* 2.3* 2.4* 2.5*    CBC: Recent Labs  Lab 11/15/21 0339 11/16/21 0234 11/17/21 0253 11/20/21 0147  WBC 10.8* 10.9* 10.3 11.5*  HGB 8.1* 8.2* 8.5* 10.7*  HCT 25.4* 26.3* 26.5* 34.7*  MCV 87.6 88.3 88.6 90.8  PLT 443* 475* 454* 421*    CBG: Recent Labs  Lab 11/20/21 1201 11/20/21 1708 11/20/21 2203 11/21/21 0000 11/21/21 0522  GLUCAP 126* 156* 126* 133* 143*    Recent Results (from the past 240 hour(s))  Culture, Respiratory w Gram Stain     Status: None   Collection Time: 11/14/21 11:45 AM   Specimen: Tracheal Aspirate; Respiratory  Result Value Ref Range Status   Specimen Description TRACHEAL ASPIRATE  Final   Special Requests  NONE  Final   Gram Stain   Final    RARE WBC PRESENT, PREDOMINANTLY PMN RARE GRAM POSITIVE COCCI IN PAIRS Performed at [facility], [place] [address], [place], [place] [address]    Culture FEW STAPHYLOCOCCUS AUREUS  Final   Report Status 11/16/2021 FINAL  Final   Organism ID, Bacteria STAPHYLOCOCCUS AUREUS  Final      Susceptibility   Staphylococcus aureus - MIC*    CIPROFLOXACIN <=0.5 SENSITIVE Sensitive     ERYTHROMYCIN >=8 RESISTANT Resistant     GENTAMICIN <=0.5 SENSITIVE Sensitive     OXACILLIN 0.5 SENSITIVE Sensitive     TETRACYCLINE <=1 SENSITIVE Sensitive     VANCOMYCIN 1 SENSITIVE Sensitive     TRIMETH/SULFA <=10 SENSITIVE Sensitive     CLINDAMYCIN RESISTANT Resistant     RIFAMPIN <=0.5 SENSITIVE Sensitive     Inducible Clindamycin POSITIVE Resistant     * FEW STAPHYLOCOCCUS AUREUS  MRSA Next Gen by PCR, Nasal     Status: None   Collection Time: 11/15/21  8:00 AM   Specimen: Nasal Mucosa; Nasal Swab  Result Value Ref Range Status   MRSA by PCR Next Gen NOT DETECTED NOT DETECTED Final    Comment: (NOTE) The GeneXpert MRSA Assay (FDA approved for NASAL specimens only), is one component of a comprehensive MRSA colonization surveillance program. It is not intended to diagnose MRSA infection nor to guide or monitor treatment for MRSA infections. Test performance is not FDA approved in patients less than 34 years old. Performed at [facility], [place] [address] [place], [place] [address]       Scheduled Meds:  amLODipine  10 mg Per Tube Daily   aspirin  81 mg Per Tube Daily   atorvastatin  40 mg Per Tube Daily   carvedilol  12.5 mg Per Tube BID WC   chlorhexidine  15 mL Mouth Rinse BID   Chlorhexidine Gluconate Cloth  6 each Topical Daily   cloNIDine  0.1 mg Per Tube TID   collagenase   Topical BID   dorzolamide-timolol  1 drop Right Eye BID   feeding supplement (PROSource TF)  45 mL Per Tube TID   feeding supplement (VITAL 1.5 CAL)  1,000 mL Per Tube  V61Y   folic acid  1 mg Per Tube Daily   free water  250 mL  Per Tube Q4H   heparin  5,000 Units Subcutaneous Q8H   insulin aspart  0-15 Units Subcutaneous Q4H   irbesartan  75 mg Per Tube Daily   lacosamide  200 mg Per Tube BID   latanoprost  1 drop Right Eye QHS   levETIRAcetam  500 mg Per Tube BID   mouth rinse  15 mL Mouth Rinse q12n4p   multivitamin with minerals  1 tablet Per Tube Daily   nutrition supplement (JUVEN)  1 packet Per Tube BID BM   pantoprazole sodium  40 mg Per Tube QHS   scopolamine  1 patch Transdermal Q72H   sodium chloride flush  10-40 mL Intracatheter Q12H   thiamine  100 mg Per Tube Daily   Continuous Infusions:  sodium chloride Stopped (11/07/21 0016)   sodium chloride 5 mL/hr at 11/19/21 0949    Active Problems:   Status epilepticus ([place])   Acute respiratory failure with hypercapnia ([facility])   Acute encephalopathy   Cryptogenic stroke (H[facility])   Pressure injury of skin   AKI (acute kidney injury) ([place])   Seizure ([place])   Acute respiratory failure with hypoxia ([place])   Status post tracheostomy ([place])   Hypokalemia   Consultants: Neurology PCCM Nephrology  Procedures: EEG Echocardiogram a bubble study Core track Tracheostomy Lumbar puncture in interventional radiology  Antibiotics: Acyclovir 11/1 through 11/4 Azithromycin 11/4 through 11/7 Ceftriaxone 11/4 through 11/7 Linezolid 11/4 through 11/5 Unasyn 11/8 through 11/11 Ceftriaxone 11/11 through 11/12 Zosyn 11/17 through 11/19 Cefazolin 11/19 through 11/21 Zosyn 11/22 through 11/29 Vancomycin 11/22 through 11/29   Time spent: 45 minutes    Erin Hearing ANP  [facility] 7 am - 330 pm/M-F for direct patient care and secure chat Please refer to Amion for contact info 29  days

## 2021-11-21 NOTE — Progress Notes (Signed)
Occupational Therapy Treatment Patient Details Name: Jeffrey Mullen MRN: [id number] DOB: 1951/07/17 Today's Date: 11/21/2021   History of present illness 70 y/o male presented to ED on 10/23/21 after bystanders witnessed patient collapse with associated seizure. Another seizure witnessed in ED. CT head negative. CT C-spine negative. LTM EEG on 11/1 showed 2 seizures from R anterior temporal region. Last seizure noted on EEG on 11/3. Intubated 10/31-11/11. Trach placed 11/11. 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 Pt engaged in bed mobility and repositioning. Pt lifting leg off bed surface and sustaining at the sight of a sock without any verbal cues. Pt smiling and giving high five with R hand. Pt will require two person (A) for EOB attempts. Recommendation SNf at this time with trach care.    Recommendations for follow up therapy are one component of a multi-disciplinary discharge planning process, led by the attending physician.  Recommendations may be updated based on patient status, additional functional criteria and insurance authorization.    Follow Up Recommendations  Skilled nursing-short term rehab (<3 hours/day) (trach care required)    Assistance Recommended at Discharge Frequent or constant Supervision/Assistance  Equipment Recommendations  Wheelchair (measurements OT);Wheelchair cushion (measurements OT);Hospital bed;Other (comment)    Recommendations for Other Services      Precautions / Restrictions Precautions Precautions: Fall Precaution Comments: trach, cortrak, rectal tube, condom cath       Mobility Bed Mobility Overal bed mobility: Needs Assistance Bed Mobility: Rolling Rolling:  Mod assist         General bed mobility comments: pt reaching with arm toward bed rail with cues. pt turning head toward bed rail with cues. pt bending knee and sustaining with cues. pt  helping pull on bed rail for rolling. pt sustained side positiong for change of linen    Transfers                   General transfer comment: no appropriate with 1 person (A)     Balance                                           ADL either performed or assessed with clinical judgement   ADL Overall ADL's : Needs assistance/impaired Eating/Feeding: NPO   Grooming: Wash/dry face;Minimal assistance;Supervision/safety Grooming Details (indicate cue type and reason): pt washing face and needing some cues for detail and terminating quickly. pt reports "feels much better" Upper Body Bathing: Maximal assistance   Lower Body Bathing: Maximal assistance   Upper Body Dressing : Maximal assistance   Lower Body Dressing: Maximal assistance                 General ADL Comments: pt initiated bed mobility with cues. pt pleased to engage in task. pt could benefit from repositioning frequently to ensure rectal tube is moved for sacral wounds    Extremity/Trunk Assessment Upper Extremity Assessment Upper Extremity Assessment: Generalized weakness;LUE deficits/detail;RUE deficits/detail RUE Deficits / Details: actively helping L UE LUE Deficits / Details: washing face with L UE            Vision   Additional Comments: R gaze preference  Perception     Praxis      Cognition Arousal/Alertness: Awake/alert Behavior During Therapy: Flat affect Overall Cognitive Status: Impaired/Different from baseline Area of Impairment: Orientation;Attention;Memory;Awareness;Safety/judgement;Following commands                 Orientation Level: Disoriented to;Place;Time;Situation (reports at across the street from store) Current Attention Level: Sustained Memory: Decreased  recall of precautions;Decreased short-term memory Following Commands: Follows multi-step commands inconsistently Safety/Judgement: Decreased awareness of deficits;Decreased awareness of safety Awareness: Intellectual   General Comments: pt communicating over trach collar this session. pt unaware of hospitalize or current medical needs. pt pleasant and agreeable. pt following two step command and smiling when told to give high five. pt does so with R UE.          Exercises Other Exercises Other Exercises: using functional bed level task to work on UB ROM shoulder elbow digits wrist bil Other Exercises: using functional bed mobiliyt to work on cervical ROM, knee flexion extension and trunk rotation   Shoulder Instructions       General Comments      Pertinent Vitals/ Pain       Pain Assessment: No/denies pain Faces Pain Scale: Hurts little more Pain Location: while coughing and touching trach collar site Pain Descriptors / Indicators: Discomfort;Grimacing Pain Intervention(s): Monitored during session  Home Living                                          Prior Functioning/Environment              Frequency  Min 2X/week        Progress Toward Goals  OT Goals(current goals can now be found in the care plan section)  Progress towards OT goals: Progressing toward goals  Acute Rehab OT Goals OT Goal Formulation: Patient unable to participate in goal setting Time For Goal Achievement: 11/28/21 Potential to Achieve Goals: Fair ADL Goals Additional ADL Goal #1: pt will follow 2 step command 75% of session Additional ADL Goal #2: pt will initiate ADL task on L side 50% of attempts Additional ADL Goal #3: pt will tolerate EOB static sitting max (A) as precursor to adls.  Plan Discharge plan needs to be updated;Frequency needs to be updated    Co-evaluation                 AM-PAC OT "6 Clicks" Daily Activity     Outcome Measure   Help from  another person eating meals?: A Lot Help from another person taking care of personal grooming?: A Lot Help from another person toileting, which includes using toliet, bedpan, or urinal?: A Lot Help from another person bathing (including washing, rinsing, drying)?: A Lot Help from another person to put on and taking off regular upper body clothing?: A Lot Help from another person to put on and taking off regular lower body clothing?: A Lot 6 Click Score: 12    End of Session Equipment Utilized During Treatment: Oxygen  OT Visit Diagnosis: Unsteadiness on feet (R26.81);Muscle weakness (generalized) (M62.81)   Activity Tolerance Patient tolerated treatment well   Patient Left in bed;with call bell/phone within reach;with bed alarm set;with nursing/sitter in room   Nurse Communication Mobility status;Precautions        Time: 1255-1310 OT Time Calculation (min): 15 min  Charges: OT General Charges $OT Visit: 1 Visit OT Treatments $Self Care/Home  Management : 8-22 mins   Brynn, OTR/L  Acute Rehabilitation Services Pager: [phone] Office: [phone] .   Jeri Modena 11/21/2021, 2:11 PM

## 2021-11-22 ENCOUNTER — Inpatient Hospital Stay (HOSPITAL_COMMUNITY): Payer: Medicare Other

## 2021-11-22 DIAGNOSIS — Z930 Tracheostomy status: Secondary | ICD-10-CM | POA: Diagnosis not present

## 2021-11-22 DIAGNOSIS — G40901 Epilepsy, unspecified, not intractable, with status epilepticus: Secondary | ICD-10-CM | POA: Diagnosis not present

## 2021-11-22 DIAGNOSIS — G9340 Encephalopathy, unspecified: Secondary | ICD-10-CM | POA: Diagnosis not present

## 2021-11-22 LAB — BASIC METABOLIC PANEL
Anion gap: 8 (ref 5–15)
BUN: 26 mg/dL — ABNORMAL HIGH (ref 8–23)
CO2: 20 mmol/L — ABNORMAL LOW (ref 22–32)
Calcium: 9.4 mg/dL (ref 8.9–10.3)
Chloride: 120 mmol/L — ABNORMAL HIGH (ref 98–111)
Creatinine, Ser: 0.79 mg/dL (ref 0.61–1.24)
GFR, Estimated: >60 mL/min (ref >60)
Glucose, Bld: 134 mg/dL — ABNORMAL HIGH (ref 70–99)
Potassium: 4 mmol/L (ref 3.5–5.1)
Sodium: 148 mmol/L — ABNORMAL HIGH (ref 135–145)

## 2021-11-22 LAB — GLUCOSE, CAPILLARY
Glucose-Capillary: 113 mg/dL — ABNORMAL HIGH (ref 70–99)
Glucose-Capillary: 132 mg/dL — ABNORMAL HIGH (ref 70–99)
Glucose-Capillary: 123 mg/dL — ABNORMAL HIGH (ref 70–99)
Glucose-Capillary: 125 mg/dL — ABNORMAL HIGH (ref 70–99)
Glucose-Capillary: 149 mg/dL — ABNORMAL HIGH (ref 70–99)
Glucose-Capillary: 118 mg/dL — ABNORMAL HIGH (ref 70–99)

## 2021-11-22 LAB — AMMONIA: Ammonia: 44 umol/L — ABNORMAL HIGH (ref 9–35)

## 2021-11-22 NOTE — Progress Notes (Signed)
Occupational Therapy Treatment Patient Details Name: Jeffrey Mullen MRN: [id number] DOB: 09-Sep-1951 Today's Date: 11/22/2021   History of present illness 70 y/o male presented to ED on 10/23/21 after bystanders witnessed patient collapse with associated seizure. Another seizure witnessed in ED. CT head negative. CT C-spine negative. LTM EEG on 11/1 showed 2 seizures from R anterior temporal region. Last seizure noted on EEG on 11/3. Intubated 10/31-11/11. Trach placed 11/11. 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 Pt was very motivated to participate in tasks. He is requiring min-mod A for sitting balance, with some periods of min guard when cuing him to maintain midline posture. Additionally, with set up, and min A moving BUE, pt able to complete ADL's seated EOB. OT will continue to follow acutely.    Recommendations for follow up therapy are one component of a multi-disciplinary discharge planning process, led by the attending physician.  Recommendations may be updated based on patient status, additional functional criteria and insurance authorization.    Follow Up Recommendations  Skilled nursing-short term rehab (<3 hours/day)    Assistance Recommended at Discharge Frequent or constant Supervision/Assistance  Equipment Recommendations  Wheelchair (measurements OT);Wheelchair cushion (measurements OT);Hospital bed;Other (comment)    Recommendations for Other Services      Precautions / Restrictions Precautions Precautions: Fall Precaution Comments: trach, cortrak, rectal tube, condom cath Restrictions Weight Bearing Restrictions: No       Mobility Bed  Mobility Overal bed mobility: Needs Assistance Bed Mobility: Rolling;Supine to Sit;Sit to Supine Rolling: Mod assist   Supine to sit: Max assist Sit to supine: Mod assist   General bed mobility comments: Pt assisting with pushing up and supporting himself back down.    Transfers                   General transfer comment: no appropriate with 1 person (A)     Balance Overall balance assessment: Needs assistance Sitting-balance support: Feet supported Sitting balance-Leahy Scale: Poor Sitting balance - Comments: Pt requiring min-mod A forbalance, at times, able to maintain midline with min guard                                   ADL either performed or assessed with clinical judgement   ADL Overall ADL's : Needs assistance/impaired Eating/Feeding: NPO   Grooming: Wash/dry hands;Wash/dry face;Oral care;Minimal assistance Grooming Details (indicate cue type and reason): Assist for postural control                               General ADL Comments: pt initiated bed mobility with cues. pt pleased to engage in task. pt could benefit from repositioning frequently to ensure rectal tube is moved for sacral wounds    Extremity/Trunk Assessment              Vision   Additional Comments: R gaze preference   Perception     Praxis      Cognition Arousal/Alertness: Awake/alert Behavior During Therapy: Flat affect Overall Cognitive Status: Impaired/Different from baseline Area of Impairment: Orientation;Attention;Memory;Awareness;Safety/judgement;Following commands  Orientation Level: Disoriented to;Place;Situation;Time Current Attention Level: Sustained Memory: Decreased recall of precautions;Decreased short-term memory Following Commands: Follows multi-step commands inconsistently Safety/Judgement: Decreased awareness of deficits;Decreased awareness of safety Awareness: Intellectual   General Comments: Pt focused  on needing a cup of water. Talking a lot this session, unsure what all he was saying, due to it being over his trach. Functional Status Assessment: Patient has had a recent decline in their functional status and demonstrates the ability to make significant improvements in function in a reasonable and predictable amount of time.        Exercises Exercises: Other exercises Other Exercises Other Exercises: Reaching forward, BUE, x10 Other Exercises: Leaning to each side, working on holding balance outside of his base of support Other Exercises: Lateral scoots/chair push up position on bed, using BLE and BUE   Shoulder Instructions       General Comments VSS, motivated to work with therapies    Pertinent Vitals/ Pain       Pain Assessment: No/denies pain  Home Living                                          Prior Functioning/Environment              Frequency  Min 2X/week        Progress Toward Goals  OT Goals(current goals can now be found in the care plan section)  Progress towards OT goals: Progressing toward goals  Acute Rehab OT Goals OT Goal Formulation: Patient unable to participate in goal setting Time For Goal Achievement: 11/28/21 Potential to Achieve Goals: Fair ADL Goals Additional ADL Goal #1: pt will follow 2 step command 75% of session Additional ADL Goal #2: pt will initiate ADL task on L side 50% of attempts Additional ADL Goal #3: pt will tolerate EOB static sitting max (A) as precursor to adls.  Plan Discharge plan remains appropriate;Frequency remains appropriate    Co-evaluation                 AM-PAC OT "6 Clicks" Daily Activity     Outcome Measure   Help from another person eating meals?: A Lot Help from another person taking care of personal grooming?: A Little Help from another person toileting, which includes using toliet, bedpan, or urinal?: A Lot Help from another person bathing (including washing, rinsing,  drying)?: A Lot Help from another person to put on and taking off regular upper body clothing?: A Lot Help from another person to put on and taking off regular lower body clothing?: A Lot 6 Click Score: 13    End of Session Equipment Utilized During Treatment: Oxygen  OT Visit Diagnosis: Unsteadiness on feet (R26.81);Muscle weakness (generalized) (M62.81)   Activity Tolerance Patient tolerated treatment well   Patient Left in bed;with call bell/phone within reach;with bed alarm set   Nurse Communication Mobility status        Time: 7026-3785 OT Time Calculation (min): 21 min  Charges: OT General Charges $OT Visit: 1 Visit OT Treatments $Therapeutic Activity: 8-22 mins  Andrae Claunch H., OTR/L Acute Rehabilitation  Venita Seng Elane Shelly Shoultz 11/22/2021, 3:24 PM

## 2021-11-22 NOTE — Progress Notes (Signed)
Physical Therapy Wound Treatment Patient Details  Name: Jeffrey Mullen MRN: [id number] Date of Birth: Aug 14, 1951  Today's Date: 11/22/2021 Time: 6270-3500 Time Calculation (min): 30 min  Subjective  Subjective Assessment Subjective: "i'm eating the whole ham" Patient and Family Stated Goals: pt unable to participate Date of Onset:  (unknown) Prior Treatments:  (unknown)  Pain Score:  1-2/10 premedication late  Wound Assessment     Wound / Incision (Open or Dehisced) 10/27/21 Skin tear Arm Left;Lower;Posterior torn blisters with open skin (Active)  Wound Image   11/20/21 1447  Dressing Type Gauze (Comment);Impregnated gauze (bismuth);Moist to dry;Normal saline moist dressing;Santyl 11/22/21 1224  Dressing Changed Changed 11/22/21 1224  Dressing Status Clean;Dry;Intact 11/22/21 1224  Dressing Change Frequency Daily 11/22/21 1224  Site / Wound Assessment Yellow;Red;Brown;Purple 11/22/21 1224  % Wound base Red or Granulating 15% 11/22/21 1224  % Wound base Yellow/Fibrinous Exudate 30% 11/22/21 1224  % Wound base Black/Eschar 55% 11/22/21 1224  % Wound base Other/Granulation Tissue (Comment) 0% 11/22/21 1224  Peri-wound Assessment Erythema (blanchable);Purple 11/22/21 1224  Wound Length (cm) 15 cm 11/20/21 1447  Wound Width (cm) 6 cm 11/20/21 1447  Wound Depth (cm) 0 cm 11/20/21 1447  Wound Volume (cm^3) 0 cm^3 11/20/21 1447  Wound Surface Area (cm^2) 90 cm^2 11/20/21 1447  Margins Unattached edges (unapproximated) 11/22/21 1224  Closure None 11/21/21 1230  Drainage Amount Minimal 11/22/21 1224  Drainage Description Serous;Sanguineous 11/22/21 1224  Treatment Cleansed;Debridement (Selective);Hydrotherapy (Pulse lavage);Packing (Saline gauze) 11/22/21 1224      Hydrotherapy Pulsed lavage therapy - wound location: L dorsal forearm Pulsed Lavage with Suction (psi): 8 psi (4-12) Pulsed Lavage with Suction - Normal Saline Used: 1000 mL Pulsed Lavage Tip: Tip with splash  shield Selective Debridement Selective Debridement - Location: left dorsal forearm Selective Debridement - Tools Used: Forceps, Scalpel Selective Debridement - Tissue Removed: black to yellow eschar.    Wound Assessment and Plan  Wound Therapy - Assess/Plan/Recommendations Wound Therapy - Clinical Statement: pt will likely benefit well from pulsed lavage to soften the eschar for selective debridement and then cleanse the wound and decrease the bioburden Wound Therapy - Functional Problem List: significant debilitation and low mobility Factors Delaying/Impairing Wound Healing: Altered sensation, Immobility, Substance abuse, Tobacco use Hydrotherapy Plan: Debridement, Dressing change, Patient/family education, Pulsatile lavage with suction Wound Therapy - Frequency: 6X / week (then decrease to 3x/wk) Wound Therapy - Current Recommendations: WOC nurse Wound Therapy - Follow Up Recommendations: dressing changes by RN, Other (comment)  Wound Therapy Goals- Improve the function of patient's integumentary system by progressing the wound(s) through the phases of wound healing (inflammation - proliferation - remodeling) by: Wound Therapy Goals - Improve the function of patient's integumentary system by progressing the wound(s) through the phases of wound healing by: Decrease Necrotic Tissue to: 25% Decrease Necrotic Tissue - Progress: Progressing toward goal Increase Granulation Tissue to: 75% Increase Granulation Tissue - Progress: Progressing toward goal Goals/treatment plan/discharge plan were made with and agreed upon by patient/family: No, Patient unable to participate in goals/treatment/discharge plan and family unavailable Time For Goal Achievement: 7 days Wound Therapy - Potential for Goals: Good  Goals will be updated until maximal potential achieved or discharge criteria met.  Discharge criteria: when goals achieved, discharge from hospital, MD decision/surgical intervention, no progress  towards goals, refusal/missing three consecutive treatments without notification or medical reason.  GP     Charges PT Wound Care Charges $Wound Debridement up to 20 cm: < or equal to 20 cm $ Wound Debridement  each add'l 20 sqcm: 4 $PT PLS Gun and Tip: 1 Supply $PT Hydrotherapy Visit: 1 Visit       Jeffrey Mullen 11/22/2021, 12:26 PM 11/22/2021  Jeffrey Carne., PT Acute Rehabilitation Services [phone]  (pager) [phone]  (office)

## 2021-11-22 NOTE — Progress Notes (Addendum)
Nutrition Follow-up  DOCUMENTATION CODES:  Not applicable  INTERVENTION:  -Transition to nocturnal TF via Cortrak:  -Jevity 1.5 @ 62ml/hr x 12 hours (959ml), to be administered from 1800-0600  -41ml Prosource TF QID  -Free water flush per MD/NP, currently 318ml Q4H  Provides 1510 kcals, 101 grams protein, 669ml free water (2438ml total free water)  Meets ~72% and ~85% of pt's minimum estimated calorie and protein needs, respectively   -Continue Juven BID per tube for wound healing -Continue MVI with minerals daily -Vital Cuisine Shake po BID, each supplement provides 520 kcal and 22 grams of protein  NUTRITION DIAGNOSIS:  Inadequate oral intake related to inability to eat as evidenced by NPO status. -- Progressing, pt on dysphagia 1 diet with nectar thick liquids  GOAL:  Patient will meet greater than or equal to 90% of their needs -- Addressing with TF  MONITOR:  PO intake, Supplement acceptance, Diet advancement, Skin, TF tolerance, Weight trends, Labs, I & O's   REASON FOR ASSESSMENT:  Ventilator    ASSESSMENT:  Pt with PMH of hepatitis C, alcohol abuse and seizures admitted with with status epilepticus.  11/11 cortrak placed 11/15 cortrak removed 11/16 cortrak replaced (gastric tip per xray)  Per NP, pt unable to independently make decisions regarding medical care -- guardianship process is pending.   Pt has cuffed trach in place and is stable on room air per NP; trach team following. Hopeful for future decannulation.   Pt underwent MBSS yesterday with SLP recommending dysphagia 1 diet with nectar thick liquids. No PO intake documented since diet advancement. Pt currently receiving TF via Cortrak. Current TF orders are Vital 1.5 cal @ 51ml/hr with 44ml Prosource TF TID and 357ml free water Q4H. Will transition pt to nocturnal TF in hopes of stimulating appetite.   Pt noted to have been experiencing large volume diarrhea. NP added florastor and fiber. Stool output  improving.   UOP: 931ml x24 hours Stool output: 41ml x24 hours I/O: +5537ml since admit  Admit weight: 79.2 kg Current weight: 73.7 kg   Non-pitting edema noted to BUE and BLE per RN assessment  Medications: Scheduled Meds:  amLODipine  10 mg Per Tube Daily   aspirin  81 mg Per Tube Daily   atorvastatin  40 mg Per Tube Daily   carvedilol  12.5 mg Per Tube BID WC   chlorhexidine  15 mL Mouth Rinse BID   Chlorhexidine Gluconate Cloth  6 each Topical Daily   cloNIDine  0.1 mg Per Tube TID   collagenase   Topical BID   dorzolamide-timolol  1 drop Right Eye BID   feeding supplement (JEVITY 1.5 CAL/FIBER)  900 mL Per Tube Q24H   feeding supplement (PROSource TF)  45 mL Per Tube QID   fiber  1 packet Per Tube BID   folic acid  1 mg Per Tube Daily   free water  300 mL Per Tube Q4H   heparin  5,000 Units Subcutaneous Q8H   insulin aspart  0-15 Units Subcutaneous Q4H   irbesartan  75 mg Per Tube Daily   lacosamide  200 mg Per Tube BID   latanoprost  1 drop Right Eye QHS   levETIRAcetam  500 mg Per Tube BID   mouth rinse  15 mL Mouth Rinse q12n4p   multivitamin with minerals  1 tablet Per Tube Daily   nicotine  14 mg Transdermal Daily   nutrition supplement (JUVEN)  1 packet Per Tube BID BM   pantoprazole sodium  40 mg Per Tube QHS   saccharomyces boulardii  250 mg Per Tube BID   scopolamine  1 patch Transdermal Q72H   sodium chloride flush  10-40 mL Intracatheter Q12H   thiamine  100 mg Per Tube Daily  Continuous Infusions:  sodium chloride Stopped (11/07/21 0016)   sodium chloride 5 mL/hr at 11/19/21 0949   Labs: Recent Labs  Lab 11/17/21 0253 11/18/21 0302 11/19/21 0146 11/20/21 0147 11/21/21 0149 11/22/21 0356 11/23/21 0045  NA 147*  148* 147* 151* 152* 155* 148* 148*  K 3.7  3.8 3.5 3.6 4.3 4.2 4.0 4.3  CL 115*  115* 114* 119* 122* 124* 120* 118*  CO2 24  24 24 23  21* 21* 20* 22  BUN 32*  32* 30* 32* 27* 36* 26* 27*  CREATININE 0.96  0.94 0.77 0.97 0.84  0.93 0.79 0.89  CALCIUM 9.0  9.0 9.4 9.4 9.3 9.6 9.4 9.1  MG  --   --   --  2.3  --   --   --   PHOS 4.0 3.7 3.5  --   --   --   --   GLUCOSE 166*  165* 137* 151* 128* 130* 134* 116*  CBGs: 118-170 x24 hours  Diet Order:   Diet Order             DIET - DYS 1 Room service appropriate? No; Fluid consistency: Nectar Thick  Diet effective now                  EDUCATION NEEDS:  Not appropriate for education at this time  Skin:  Skin Assessment: Skin Integrity Issues: Skin Integrity Issues:: Stage II, DTI, Unstageable, Other (Comment) DTI: L heel Stage II: R heel, L elbow, anus Unstageable: L/R buttocks Other: skin tears to penis, back, arm  Last BM:  11/30 via rectal tube/pouch  Height:  Ht Readings from Last 1 Encounters:  11/02/21 5\' 10"  (1.778 m)   Weight:  Wt Readings from Last 1 Encounters:  11/18/21 73.7 kg   Ideal Body Weight:  75.5 kg  BMI:  Body mass index is 23.31 kg/m.  Estimated Nutritional Needs:  Kcal:  2100-2300 Protein:  120-140 grams Fluid:  > 2 /day   Theone Stanley., MS, RD, LDN (she/her/hers) RD pager number and weekend/on-call pager number located in [place].

## 2021-11-22 NOTE — Progress Notes (Signed)
Modified Barium Swallow Progress Note  Patient Details  Name: Jeffrey Mullen MRN: [id number] Date of Birth: 10-31-1951  Today's Date: 11/22/2021  Modified Barium Swallow completed.  Full report located under Chart Review in the Imaging Section.  Brief recommendations include the following:  Clinical Impression  Pt was seen for an MBS. Overall, pt exhibits moderate oropharyngeal dysphagia due to oral weakness and incoordination and impaired timing of epiglottic inversion resulting in aspiration of thin liquids and penetration of nectar thick liquid. Pt exhibited no oral phase impairments with thin liquid from cup or straw, however demonstrated weak lingual manipulation, lingual pumping, and delayed oral transit of nectar thick and puree with additional impaired mastication of regular solids. Pharyngeally, pt exhibited penetration to above the level of the vocal cords that did not clear (PAS 3) with small sips of thin liquid from straw and aspiration not clearing despite attempt (PAS 7) with large/consecutive sips from straw. Large/consecutive sips of nectar thick liquid from straw resulted in trace amount of penetration above the level of the vocal folds that did not clear (PAS 3). Pt exhibited no penetration/aspiration with puree or regular solids. Across all consistencies, pt exhibited mild-moderate vallecular residue and backflow to the cricopharyngeal segment after original clearance.  Scanned the esophagus which is not diagnostic however revealing retrograde movement to proximal esophagus. Recommend Dys1/nectar thick liquid diet, administering medications crushed in puree. Wear PMV with all meals and meds. Pt requires full supervision to implement small sips/bites and slow rate strategies. SLP will continue to follow pt for management of diet tolerance and upgraded PO trials as appropriate.   Swallow Evaluation Recommendations   Recommended Consults: Consider GI evaluation   SLP Diet  Recommendations: Dysphagia 1 (Puree) solids;Nectar thick liquid   Liquid Administration via: Cup;Straw   Medication Administration: Crushed with puree   Supervision: Full assist for feeding;Full supervision/cueing for compensatory strategies   Compensations: Slow rate;Small sips/bites;Minimize environmental distractions;Lingual sweep for clearance of pocketing (wear PMV with meals)   Postural Changes: Remain semi-upright after after feeds/meals (Comment);Seated upright at 90 degrees   Oral Care Recommendations: Oral care BID   Other Recommendations: Order thickener from pharmacy    Houston Siren 11/22/2021,3:14 PM

## 2021-11-22 NOTE — Progress Notes (Signed)
[facility] PROGRESS NOTE  Jeffrey Mullen MVE:[id number] DOB: 1951/11/07 DOA: 10/23/2021 PCP: [name], NP  Status: Remains inpatient appropriate because:  Unsafe discharge plan; currently not medically stable  Barriers to discharge: Social: Patient unable to independently make decisions regarding medical care-guardianship process pending  Clinical: Core track in place, trach tube in place, significantly impaired mobility secondary to right hemiparesis  Level of care:  Progressive   Code Status: DNR Family Communication: Has no family available to assist.  Girlfriend also unable to assist with patient after discharge. DVT prophylaxis: Subcutaneous heparin COVID vaccination status: 02/18/2020, 04/25/2020 and 11/11/2020  HPI: 70 y/o M with past medical history of hypertension, glaucoma who presented to [facility] ER on 10/31 via EMS with reports of altered mental status.  Given  right sided gaze, altered and left sided weakness, code stoke called. The patient was taken emergently to CT for imaging which was negative for LVO.  While in the [place], he had a witnessed seizure.  PCCM called for ICU admission. Chart review shows he was seen in the ER on 10/4 after passing out at the [employer].  Physical exam was negative at the time.  Labs negative.  No further imaging obtained.    Subjective: Sleeping soundly and did not awaken during physical exam.  Objective: Vitals:   11/22/21 0412 11/22/21 0414  BP:  (!) 141/80  Pulse:  92  Resp: 16 16  Temp:  98.3 F (36.8 C)  SpO2: 98% 99%    Intake/Output Summary (Last 24 hours) at 11/22/2021 0743 Last data filed at 11/22/2021 0618 Gross per 24 hour  Intake 100 ml  Output 2650 ml  Net -2550 ml   Filed Weights   11/16/21 0500 11/17/21 0408 11/18/21 0423  Weight: 82.8 kg 77.3 kg 73.7 kg    Exam:  Constitutional: NAD, calm, comfortable Respiratory: 6.0 cuffed trach 5 L/room air with 100% O2 saturation, clear to  auscultation bilaterally. Normal respiratory effort.  Cardiovascular: Regular rate and rhythm, no murmurs / rubs / gallops. No extremity edema.  Normotensive Abdomen: no tenderness, no masses palpated. Bowel sounds positive.  Cortrack tube in place.  Rectal tube in place with high volume liquid brown stool  Skin: Left arm wound currently covered with dressing which is clean dry and intact Neurologic: Sleeping but at baseline CN 2-12 grossly intact. Sensation intact, Strength 3-4/5 on left, marked decrease strength on right: Arm is 2/5 and leg is 3/5 Psychiatric: Sleeping but at baseline alert and appropriately interactive.  Able to vocalize and follow simple commands  Assessment/Plan: Acute problems: Acute stroke Found to have multifocal bilateral embolic stroke. TTE: normal systolic function.  Grade 2 diastolic dysfunction was noted.  No embolic source was identified.  TEE was attempted but could not be performed successfully. LDL noted to be 66.  HbA1c 6.0.  Patient is on statin. Currently on aspirin.   Noted to be quite deconditioned.  PT and OT.   Status epilepticus Resolved.   No seizure activity noted on EEG.   Continue Keppra and lacosamide.     Acute metabolic encephalopathy Encephalopathy is likely secondary to all of his acute issues including epilepsy and stroke and acute respiratory failure.   Sepsis likely secondary to tracheitis Trach cultures were positive for MSSA.   Treated with Zosyn >> Ancef followed by vancomycin and Zosyn due to development of fever.   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   Continue local wound care.       10/23/2021                  11/20/2021   Acute respiratory failure with hypoxia requiring mechanical ventilation/status post tracheostomy Underwent tracheostomy on 11/11.  Required mechanical ventilation.  Currently saturating reasonably well on trach collar.  Pulmonary toilet. Trach care per PCCM.   Hypernatremia/high volume stool output 2/2 free H20 deficit   11/29: Na+ 155 so free H2O increased to300 cc q 4hrs Likely from large volume diarrhea so have added florastor and fiber per tube Stool output over the past 24 hours down to 300 cc No fevers and only mild leukocytosis in context of acute MSSA tracheitis and arm wound.     Essential hypertension Continue amlodipine, carvedilol, clonidine and irbesartan.   Blood pressure essentially controlled  Anemia of critical illness Hemoglobin 10.7 on 11/28   Hypokalemia Resolved with potassium 4.2 as of 00/93   Alcoholic liver cirrhosis PCP notes document history of alcohol abuse and known cirrhosis LFTs stable.  RUQ ultrasound c/w cirrhosis.  Unremarkable appearance of gallbladder and biliary tree was noted. Ammonia level 44 slightly elevated BUN-given current high-volume diarrhea be a candidate for lactulose at this juncture  Acute kidney injury due to contrast-induced ATN Resolved.   Current creatinine 0.93  Nontraumatic rhabdomyolysis Resolved   Goals of care Palliative medicine has been following.  They had been communicating with patient's former significant other.  Apparently this person requested that she no longer wanted to be the surrogate decision maker.  Previously his biological daughter has also mentioned that she does not want to be the primary decision maker.  Social worker to help conduct search for alternative family members/next of kin. After discussions with critical care medicine and with two-physician documentation patient's CODE STATUS was changed to DNR.  See note from 11/27.    Data Reviewed: Basic  Metabolic Panel: Recent Labs  Lab 11/16/21 0234 11/17/21 0253 11/18/21 0302 11/19/21 0146 11/20/21 0147 11/21/21 0149 11/22/21 0356  NA 146* 147*  148* 147* 151* 152* 155* 148*  K 3.5 3.7  3.8 3.5 3.6 4.3 4.2 4.0  CL 112* 115*  115* 114* 119* 122* 124* 120*  CO2 26 24  24 24 23  21* 21* 20*  GLUCOSE 149* 166*  165* 137* 151* 128* 130* 134*  BUN 32* 32*  32* 30* 32* 27* 36* 26*  CREATININE 0.94 0.96  0.94 0.77 0.97 0.84 0.93 0.79  CALCIUM 9.2 9.0  9.0 9.4 9.4 9.3 9.6 9.4  MG  --   --   --   --  2.3  --   --   PHOS 3.6 4.0 3.7 3.5  --   --   --    Liver Function Tests: Recent Labs  Lab 11/16/21 0234 11/17/21 0253 11/18/21 0302 11/19/21 0146 11/20/21 0147  AST  --  122*  --   --  117*  ALT  --  119*  --   --  168*  ALKPHOS  --  330*  --   --  321*  BILITOT  --  0.7  --   --  0.7  PROT  --  7.6  --   --  8.0  ALBUMIN 2.1* 2.2*  2.2* 2.3* 2.4* 2.5*    CBC: Recent Labs  Lab 11/16/21 0234 11/17/21 0253 11/20/21 0147  WBC 10.9* 10.3 11.5*  HGB 8.2* 8.5* 10.7*  HCT 26.3* 26.5* 34.7*  MCV 88.3 88.6 90.8  PLT 475* 454* 421*    CBG: Recent Labs  Lab 11/21/21 1134 11/21/21 1542 11/21/21 1941 11/21/21 2345 11/22/21 0411  GLUCAP 151* 116* 131* 146* 113*    Recent Results (from the past 240 hour(s))  Culture, Respiratory w Gram Stain     Status: None   Collection Time: 11/14/21 11:45 AM   Specimen: Tracheal Aspirate; Respiratory  Result Value Ref Range Status   Specimen Description TRACHEAL ASPIRATE  Final   Special Requests NONE  Final   Gram Stain   Final    RARE WBC PRESENT, PREDOMINANTLY PMN RARE GRAM POSITIVE COCCI IN PAIRS Performed at [facility], [place] [address] [place], [place] [address]    Culture FEW STAPHYLOCOCCUS AUREUS  Final   Report Status 11/16/2021 FINAL  Final   Organism ID, Bacteria STAPHYLOCOCCUS AUREUS  Final      Susceptibility   Staphylococcus aureus - MIC*    CIPROFLOXACIN <=0.5 SENSITIVE Sensitive     ERYTHROMYCIN  >=8 RESISTANT Resistant     GENTAMICIN <=0.5 SENSITIVE Sensitive     OXACILLIN 0.5 SENSITIVE Sensitive     TETRACYCLINE <=1 SENSITIVE Sensitive     VANCOMYCIN 1 SENSITIVE Sensitive     TRIMETH/SULFA <=10 SENSITIVE Sensitive     CLINDAMYCIN RESISTANT Resistant     RIFAMPIN <=0.5 SENSITIVE Sensitive     Inducible Clindamycin POSITIVE Resistant     * FEW STAPHYLOCOCCUS AUREUS  MRSA Next Gen by PCR, Nasal     Status: None   Collection Time: 11/15/21  8:00 AM   Specimen: Nasal Mucosa; Nasal Swab  Result Value Ref Range Status   MRSA by PCR Next Gen NOT DETECTED NOT DETECTED Final    Comment: (NOTE) The GeneXpert MRSA Assay (FDA approved for NASAL specimens only), is one component of a comprehensive MRSA colonization surveillance program. It is not intended to diagnose MRSA infection nor to guide or monitor treatment for MRSA infections. Test performance is not FDA approved in patients less than 39 years old. Performed at [facility], [place] [address] [place], [place] [address]       Scheduled Meds:  amLODipine  10 mg Per Tube Daily   aspirin  81 mg Per Tube Daily   atorvastatin  40 mg Per Tube Daily   carvedilol  12.5 mg Per Tube BID WC   chlorhexidine  15 mL Mouth Rinse BID   Chlorhexidine Gluconate Cloth  6 each Topical Daily   cloNIDine  0.1 mg Per Tube TID   collagenase   Topical BID   dorzolamide-timolol  1 drop Right Eye BID   feeding supplement (PROSource TF)  45 mL Per Tube TID   feeding supplement (VITAL 1.5 CAL)  1,000 mL Per Tube Q24H   fiber  1 packet Per Tube BID   folic acid  1 mg Per Tube Daily   free water  300 mL Per Tube Q4H   heparin  5,000 Units Subcutaneous Q8H   insulin aspart  0-15 Units Subcutaneous Q4H   irbesartan  75 mg Per Tube Daily   lacosamide  200 mg Per Tube BID   latanoprost  1 drop Right Eye QHS   levETIRAcetam  500 mg Per Tube BID   mouth rinse  15 mL Mouth Rinse q12n4p   multivitamin with minerals  1 tablet Per Tube Daily    nicotine  14 mg Transdermal Daily   nutrition supplement (JUVEN)  1 packet Per Tube BID BM   pantoprazole sodium  40 mg Per Tube QHS   saccharomyces boulardii  250 mg Per Tube BID   scopolamine  1 patch Transdermal Q72H   sodium chloride flush  10-40 mL Intracatheter Q12H   thiamine  100 mg Per Tube Daily   Continuous Infusions:  sodium chloride Stopped (11/07/21 0016)   sodium chloride 5 mL/hr at 11/19/21 0949   dextrose      Active Problems:   Status epilepticus (HCC)   Acute respiratory failure with hypercapnia (HCC)   Acute encephalopathy   Cryptogenic stroke (HCC)   Pressure injury of skin   AKI (acute kidney injury) ([place])   Seizure ([place])   Acute respiratory failure with hypoxia ([place])   Status post tracheostomy ([place])   Hypokalemia   Consultants: Neurology PCCM Nephrology  Procedures: EEG Echocardiogram a bubble study Core track Tracheostomy Lumbar puncture in interventional radiology  Antibiotics: Acyclovir 11/1 through 11/4 Azithromycin 11/4 through 11/7 Ceftriaxone 11/4 through 11/7 Linezolid 11/4 through 11/5 Unasyn 11/8 through 11/11 Ceftriaxone 11/11 through 11/12 Zosyn 11/17 through 11/19 Cefazolin 11/19 through 11/21 Zosyn 11/22 through 11/28 Vancomycin 11/22 through 11/28   Time spent: 45 minutes    Erin Hearing ANP  [facility] 7 am - 330 pm/M-F for direct patient care and secure chat Please refer to Amion for contact info 30  days

## 2021-11-22 NOTE — Progress Notes (Signed)
Speech Language Pathology Treatment:    Patient Details Name: Jeffrey Mullen MRN: [id number] DOB: 1951-12-22 Today's Date: 11/22/2021 Time:  -     SLP changed pt's swallow assessment to an MBS which is scheduled today at 1:30.    Recommendations for follow up therapy are one component of a multi-disciplinary discharge planning process, led by the attending physician.  Recommendations may be updated based on patient status, additional functional criteria and insurance authorization.    Recommendations                           GO                Houston Siren  11/22/2021, 8:54 AM  Orbie Pyo Colvin Caroli.Ed Risk analyst [phone] Office [phone]

## 2021-11-23 DIAGNOSIS — T8089XA Other complications following infusion, transfusion and therapeutic injection, initial encounter: Secondary | ICD-10-CM

## 2021-11-23 DIAGNOSIS — T8089XD Other complications following infusion, transfusion and therapeutic injection, subsequent encounter: Secondary | ICD-10-CM

## 2021-11-23 DIAGNOSIS — G40901 Epilepsy, unspecified, not intractable, with status epilepticus: Secondary | ICD-10-CM | POA: Diagnosis not present

## 2021-11-23 DIAGNOSIS — Z930 Tracheostomy status: Secondary | ICD-10-CM | POA: Diagnosis not present

## 2021-11-23 DIAGNOSIS — I96 Gangrene, not elsewhere classified: Secondary | ICD-10-CM

## 2021-11-23 DIAGNOSIS — I639 Cerebral infarction, unspecified: Secondary | ICD-10-CM | POA: Diagnosis not present

## 2021-11-23 DIAGNOSIS — J9601 Acute respiratory failure with hypoxia: Secondary | ICD-10-CM | POA: Diagnosis not present

## 2021-11-23 LAB — BASIC METABOLIC PANEL
Anion gap: 8 (ref 5–15)
BUN: 27 mg/dL — ABNORMAL HIGH (ref 8–23)
CO2: 22 mmol/L (ref 22–32)
Calcium: 9.1 mg/dL (ref 8.9–10.3)
Chloride: 118 mmol/L — ABNORMAL HIGH (ref 98–111)
Creatinine, Ser: 0.89 mg/dL (ref 0.61–1.24)
GFR, Estimated: >60 mL/min (ref >60)
Glucose, Bld: 116 mg/dL — ABNORMAL HIGH (ref 70–99)
Potassium: 4.3 mmol/L (ref 3.5–5.1)
Sodium: 148 mmol/L — ABNORMAL HIGH (ref 135–145)

## 2021-11-23 LAB — GLUCOSE, CAPILLARY
Glucose-Capillary: 109 mg/dL — ABNORMAL HIGH (ref 70–99)
Glucose-Capillary: 116 mg/dL — ABNORMAL HIGH (ref 70–99)
Glucose-Capillary: 133 mg/dL — ABNORMAL HIGH (ref 70–99)
Glucose-Capillary: 134 mg/dL — ABNORMAL HIGH (ref 70–99)
Glucose-Capillary: 152 mg/dL — ABNORMAL HIGH (ref 70–99)
Glucose-Capillary: 170 mg/dL — ABNORMAL HIGH (ref 70–99)
Glucose-Capillary: 208 mg/dL — ABNORMAL HIGH (ref 70–99)

## 2021-11-23 MED ORDER — JEVITY 1.5 CAL/FIBER PO LIQD
900.0000 mL | ORAL | Status: DC
Start: 2021-11-23 — End: 2021-12-04
  Administered 2021-11-23 – 2021-12-03 (×11): 900 mL
  Filled 2021-11-23 (×12): qty 948

## 2021-11-23 MED ORDER — PROSOURCE TF PO LIQD
45.0000 mL | Freq: Four times a day (QID) | ORAL | Status: DC
  Administered 2021-11-23 – 2021-12-04 (×43): 45 mL
  Filled 2021-11-23 (×42): qty 45

## 2021-11-23 NOTE — Progress Notes (Addendum)
Physical Therapy Treatment Patient Details Name: Jeffrey Mullen MRN: [id number] DOB: 23-Jun-1951 Today's Date: 11/23/2021   History of Present Illness 70 y/o male presented to ED on 10/23/21 after bystanders witnessed patient collapse with associated seizure. Another seizure witnessed in ED. CT head negative. CT C-spine negative. LTM EEG on 11/1 showed 2 seizures from R anterior temporal region. Last seizure noted on EEG on 11/3. Intubated 10/31-11/11. Trach placed 11/11. 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 Upon entry, PTA noticed that condom cath was not properly attached and bed pad soaked in urine. Pt also noted to be attached to yellow Medical Air input on wall with trach and RN supervisor notified by PTA and subsequently RN attached it back to green O2 wall input. Later, supervisor RN spoke with Respiratory Therapist that set-up listed above is correct for this patient. Defer EOB transfer for safety due to pt lethargy, emphasis on rolling to change to a clean pad and long sitting to challenge balance with up to maxA+2 to maintain long sit due to heavy posterior lean. LTACH remains appropriate upon DC. Pt continues to benefit from PT services to progress toward functional mobility goals.       Recommendations for follow up therapy are one component of a multi-disciplinary discharge planning process, led by the attending physician.  Recommendations may be updated based on patient status, additional functional criteria and insurance authorization.  Follow Up Recommendations  PT at Long-term acute care hospital     Assistance Recommended at Discharge Frequent or constant Supervision/Assistance   Equipment Recommendations  Other (comment) (TBD at next venue)    Recommendations for Other Services       Precautions / Restrictions Precautions Precautions: Fall Precaution Comments: trach, cortrak, rectal tube, condom cath Restrictions Weight Bearing Restrictions: No     Mobility  Bed Mobility Overal bed mobility: Needs Assistance Bed Mobility: Rolling Rolling: Max assist;+2 for physical assistance   General bed mobility comments: with max tactile and verbal cues pt did assist rolling and reaching for side rails; pt sat in long sitting for approx 1 min holding both side rails and maxA +2 due to posterior lean    Transfers  General transfer comment: not appropriate    Ambulation/Gait  General Gait Details: not appropriate   Modified Rankin (Stroke Patients Only) Modified Rankin (Stroke Patients Only) Pre-Morbid Rankin Score: No symptoms Modified Rankin: Severe disability     Balance Overall balance assessment: Needs assistance Sitting-balance support: Bilateral upper extremity supported (in long sitting) Sitting balance-Leahy Scale: Poor Sitting balance - Comments: maxA +2 for sitting balance in long sitting due to heavy posterior lean, frequent cues to grip side rails and use arms to pull. Pt tolerated ~1 minute in long sit. Postural control: Posterior lean      Cognition Arousal/Alertness: Awake/alert Behavior During Therapy: Flat affect Overall Cognitive Status: No family/caregiver present to determine baseline cognitive functioning Area of Impairment: Orientation;Attention;Memory;Awareness;Safety/judgement;Following commands    Orientation Level: Disoriented to;Place;Situation;Time Current Attention Level: Sustained Memory: Decreased recall of precautions;Decreased short-term memory Following Commands: Follows one step commands inconsistently Safety/Judgement: Decreased awareness of deficits;Decreased awareness of safety Awareness: Intellectual    General Comments: pt repeatedly asking for cola even though he was educated he  can't have any, pt agreed to nectar thick cranberry juice, pt with delayed processing and very slow to follow simple commands requiring tactile cues as well RN notified pt requesting nectar cranberry juice.       Exercises General Exercises - Upper Extremity Shoulder Flexion: AAROM;Both;5 reps;Seated General Exercises - Lower Extremity Long Arc Quad: AAROM;Both;Seated;5 reps    General Comments        Pertinent Vitals/Pain Pain Assessment: No/denies pain Faces Pain Scale: No hurt     PT Goals (current goals can now be found in the care plan section) Acute Rehab PT Goals PT Goal Formulation: With patient Time For Goal Achievement: 12/04/21 Progress towards PT goals: Progressing toward goals    Frequency    Min 3X/week      PT Plan Current plan remains appropriate       AM-PAC PT "6 Clicks" Mobility   Outcome Measure  Help needed turning from your back to your side while in a flat bed without using bedrails?: A Lot Help needed moving from lying on your back to sitting on the side of a flat bed without using bedrails?: A Lot Help needed moving to and from a bed to a chair (including a wheelchair)?: Total Help needed standing up from a chair using your arms (e.g., wheelchair or bedside chair)?: Total Help needed to walk in hospital room?: Total Help needed climbing 3-5 steps with a railing? : Total 6 Click Score: 8    End of Session Equipment Utilized During Treatment: Oxygen (via trach collar) Activity Tolerance: Patient tolerated treatment well Patient left: in bed;with call bell/phone within reach;with bed alarm set Nurse Communication: Mobility status PT Visit Diagnosis: Muscle weakness (generalized) (M62.81);Difficulty in walking, not elsewhere classified (R26.2);Other symptoms and signs involving the nervous system (R29.898)     Time: 4270-6237 PT Time Calculation (min) (ACUTE  ONLY): 30 min  Charges:  $Therapeutic Exercise: 8-22 mins $Therapeutic Activity: 8-22 mins                     Evelene Croon, Student PTA CI: Carly P., PTA  Carly M Poff 11/23/2021, 5:26 PM

## 2021-11-23 NOTE — Progress Notes (Addendum)
[facility] PROGRESS NOTE  Jeffrey Mullen NID:[id number] DOB: [birth year] DOA: 10/23/2021 PCP: [name], NP  Status: Remains inpatient appropriate because:  Unsafe discharge plan; currently not medically stable  Barriers to discharge: Social: Patient unable to independently make decisions regarding medical care-guardianship process pending  Clinical: Core track in place, trach tube in place, significantly impaired mobility secondary to right hemiparesis  Level of care:  Progressive   Code Status: DNR Family Communication: Has no family available to assist.  Girlfriend also unable to assist with patient after discharge. DVT prophylaxis: Subcutaneous heparin COVID vaccination status: 02/18/2020, 04/25/2020 and 11/11/2020  HPI: 70 y/o M with past medical history of hypertension, glaucoma who presented to [facility] ER on 10/31 via EMS with reports of altered mental status.  Given  right sided gaze, altered and left sided weakness, code stoke called. The patient was taken emergently to CT for imaging which was negative for LVO.  While in the [place], he had a witnessed seizure.  PCCM called for ICU admission. Chart review shows he was seen in the ER on 10/4 after passing out at the [employer].  Physical exam was negative at the time.  Labs negative.  No further imaging obtained.    Subjective: Awake.  Interactive but confused.  No complaints.  Objective: Vitals:   11/22/21 2348 11/23/21 0349  BP: 122/67 109/62  Pulse: 89 88  Resp: 15 16  Temp: 98.1 F (36.7 C) 97.9 F (36.6 C)  SpO2: 99% 98%    Intake/Output Summary (Last 24 hours) at 11/23/2021 0739 Last data filed at 11/23/2021 0349 Gross per 24 hour  Intake 6447 ml  Output 1350 ml  Net 5097 ml   Filed Weights   11/16/21 0500 11/17/21 0408 11/18/21 0423  Weight: 82.8 kg 77.3 kg 73.7 kg    Exam:  Constitutional: NAD, calm, comfortable Respiratory: 6.0 cuffed trach 5 L/room air with 100% O2 saturation,  clear to auscultation bilaterally. Normal respiratory effort.  Thick green secretions noted on trach dressing and oozing out of trach tubing Cardiovascular: Regular rate and rhythm, no murmurs / rubs / gallops. No extremity edema.  Normotensive Abdomen: no tenderness, no masses palpated. Bowel sounds positive.  Cortrack tube in place.  Rectal tube in place with high volume liquid brown stool  Skin: Left arm wound covered with dressing which is clean dry and intact Neurologic: CN 2-12 grossly intact. Sensation intact, Strength 3-4/5 on left, marked decrease strength on right: Arm is 2/5 and leg is 3/5 Psychiatric:  alert and appropriately interactive.  Able to vocalize and follow simple commands-oriented to 2-year only.  He stated that he was in [place] and that he came in because he was "sick"..  Reoriented to place and reason for hospitalization  Assessment/Plan: Acute problems: Acute multifocal bilateral embolic stroke TTE: normal systolic function.  Grade 2 diastolic dysfunction  No embolic source was identified.  TEE was attempted but could not be performed successfully. LDL noted to be 66.  HbA1c 6.0.  Patient is on statin. Currently on aspirin.   Noted to be quite deconditioned.  PT and OT.   Status epilepticus Resolved.   EEG was negative Continue Keppra and lacosamide.     Acute metabolic encephalopathy Encephalopathy is likely secondary to all of his acute issues including epilepsy and stroke and acute respiratory failure.   Sepsis likely secondary to tracheitis Trach cultures were positive for MSSA.   Treated with Zosyn >> Ancef followed by vancomycin and Zosyn due to  development of fever.   11/28 LD Zosyn and vancomycin   Tracheostomy dependent Has cuffed trach in place and stable on room air [place] team following On 11/29 noted with strong cough and is doing well with SLP Hopefully will be a candidate for decannulation in the future Currently noted with significant  thick green secretions on tracheal dressing and oozing out of trach therefore will obtain respiratory culture.  Remains afebrile   Left cephalic vein superficial thrombophlebitis/left arm extravasation injury Status post phentolamine and nitroglycerin application.   Continue local wound care to include hydrotherapy-had 3 sessions thus far.  Consider surgical consultation if no improvement after 1 week of hydrotherapy      10/23/2021                  11/20/2021   Acute respiratory failure with hypoxia requiring mechanical ventilation/status post tracheostomy Underwent tracheostomy on 11/11.  Required mechanical ventilation.  Currently saturating reasonably well on trach collar.  Pulmonary toilet. Trach care per PCCM.   Hypernatremia/high volume stool output 2/2 free H20 deficit   11/29: Na+ 155 so free H2O increased to300 cc q 4hrs-sodium 148 on 12/1 Due to volume has decreased dramatically over the past 48 hours with the addition of the above medications.  Likely can discontinue rectal tube soon   Essential hypertension Continue amlodipine, carvedilol, clonidine and irbesartan.   Blood pressure essentially controlled  Anemia of critical illness Hemoglobin 10.7 on 11/28   Hypokalemia Resolved with potassium 4.2 as of 62/22   Alcoholic liver cirrhosis PCP notes document history of alcohol abuse and known cirrhosis LFTs stable.  RUQ ultrasound c/w cirrhosis.  Unremarkable appearance of gallbladder and biliary tree was noted. Ammonia level 44 slightly elevated BUN-given current high-volume diarrhea be a candidate for lactulose at this juncture  Acute kidney injury due to contrast-induced ATN Resolved.   Current creatinine 0.93  Nontraumatic rhabdomyolysis Resolved   Goals of care Palliative medicine has been following.  They had been communicating with patient's former significant other.  Apparently this person requested that she no longer wanted to be the surrogate decision  maker.  Previously his biological daughter has also mentioned that she does not want to be the primary decision maker.  Social worker to help conduct search for alternative family members/next of kin. After discussions with critical care medicine and with two-physician documentation patient's CODE STATUS was changed to DNR.  See note from 11/27.    Data Reviewed: Basic Metabolic Panel: Recent Labs  Lab 11/17/21 0253 11/18/21 0302 11/19/21 0146 11/20/21 0147 11/21/21 0149 11/22/21 0356 11/23/21 0045  NA 147*  148* 147* 151* 152* 155* 148* 148*  K 3.7  3.8 3.5 3.6 4.3 4.2 4.0 4.3  CL 115*  115* 114* 119* 122* 124* 120* 118*  CO2 24  24 24 23  21* 21* 20* 22  GLUCOSE 166*  165* 137* 151* 128* 130* 134* 116*  BUN 32*  32* 30* 32* 27* 36* 26* 27*  CREATININE 0.96  0.94 0.77 0.97 0.84 0.93 0.79 0.89  CALCIUM 9.0  9.0 9.4 9.4 9.3 9.6 9.4 9.1  MG  --   --   --  2.3  --   --   --   PHOS 4.0 3.7 3.5  --   --   --   --    Liver Function Tests: Recent Labs  Lab 11/17/21 0253 11/18/21 0302 11/19/21 0146 11/20/21 0147  AST 122*  --   --  117*  ALT 119*  --   --  168*  ALKPHOS 330*  --   --  321*  BILITOT 0.7  --   --  0.7  PROT 7.6  --   --  8.0  ALBUMIN 2.2*  2.2* 2.3* 2.4* 2.5*    CBC: Recent Labs  Lab 11/17/21 0253 11/20/21 0147  WBC 10.3 11.5*  HGB 8.5* 10.7*  HCT 26.5* 34.7*  MCV 88.6 90.8  PLT 454* 421*    CBG: Recent Labs  Lab 11/22/21 1548 11/22/21 1957 11/22/21 2327 11/23/21 0302 11/23/21 0731  GLUCAP 125* 149* 118* 134* 152*    Recent Results (from the past 240 hour(s))  Culture, Respiratory w Gram Stain     Status: None   Collection Time: 11/14/21 11:45 AM   Specimen: Tracheal Aspirate; Respiratory  Result Value Ref Range Status   Specimen Description TRACHEAL ASPIRATE  Final   Special Requests NONE  Final   Gram Stain   Final    RARE WBC PRESENT, PREDOMINANTLY PMN RARE GRAM POSITIVE COCCI IN PAIRS Performed at [facility],  [place] [address] [place], [place] [address]    Culture FEW STAPHYLOCOCCUS AUREUS  Final   Report Status 11/16/2021 FINAL  Final   Organism ID, Bacteria STAPHYLOCOCCUS AUREUS  Final      Susceptibility   Staphylococcus aureus - MIC*    CIPROFLOXACIN <=0.5 SENSITIVE Sensitive     ERYTHROMYCIN >=8 RESISTANT Resistant     GENTAMICIN <=0.5 SENSITIVE Sensitive     OXACILLIN 0.5 SENSITIVE Sensitive     TETRACYCLINE <=1 SENSITIVE Sensitive     VANCOMYCIN 1 SENSITIVE Sensitive     TRIMETH/SULFA <=10 SENSITIVE Sensitive     CLINDAMYCIN RESISTANT Resistant     RIFAMPIN <=0.5 SENSITIVE Sensitive     Inducible Clindamycin POSITIVE Resistant     * FEW STAPHYLOCOCCUS AUREUS  MRSA Next Gen by PCR, Nasal     Status: None   Collection Time: 11/15/21  8:00 AM   Specimen: Nasal Mucosa; Nasal Swab  Result Value Ref Range Status   MRSA by PCR Next Gen NOT DETECTED NOT DETECTED Final    Comment: (NOTE) The GeneXpert MRSA Assay (FDA approved for NASAL specimens only), is one component of a comprehensive MRSA colonization surveillance program. It is not intended to diagnose MRSA infection nor to guide or monitor treatment for MRSA infections. Test performance is not FDA approved in patients less than 84 years old. Performed at [facility], [place] [address], [place], [place] [address]       Scheduled Meds:  amLODipine  10 mg Per Tube Daily   aspirin  81 mg Per Tube Daily   atorvastatin  40 mg Per Tube Daily   carvedilol  12.5 mg Per Tube BID WC   chlorhexidine  15 mL Mouth Rinse BID   Chlorhexidine Gluconate Cloth  6 each Topical Daily   cloNIDine  0.1 mg Per Tube TID   collagenase   Topical BID   dorzolamide-timolol  1 drop Right Eye BID   feeding supplement (PROSource TF)  45 mL Per Tube TID   feeding supplement (VITAL 1.5 CAL)  1,000 mL Per Tube Q24H   fiber  1 packet Per Tube BID   folic acid  1 mg Per Tube Daily   free water  300 mL Per Tube Q4H   heparin  5,000 Units Subcutaneous Q8H    insulin aspart  0-15 Units Subcutaneous Q4H   irbesartan  75 mg Per Tube Daily   lacosamide  200 mg  Per Tube BID   latanoprost  1 drop Right Eye QHS   levETIRAcetam  500 mg Per Tube BID   mouth rinse  15 mL Mouth Rinse q12n4p   multivitamin with minerals  1 tablet Per Tube Daily   nicotine  14 mg Transdermal Daily   nutrition supplement (JUVEN)  1 packet Per Tube BID BM   pantoprazole sodium  40 mg Per Tube QHS   saccharomyces boulardii  250 mg Per Tube BID   scopolamine  1 patch Transdermal Q72H   sodium chloride flush  10-40 mL Intracatheter Q12H   thiamine  100 mg Per Tube Daily   Continuous Infusions:  sodium chloride Stopped (11/07/21 0016)   sodium chloride 5 mL/hr at 11/19/21 0949    Active Problems:   Status epilepticus (HCC)   Acute respiratory failure with hypercapnia (HCC)   Acute encephalopathy   Cryptogenic stroke (HCC)   Pressure injury of skin   AKI (acute kidney injury) ([place])   Seizure ([place])   Acute respiratory failure with hypoxia ([place])   Status post tracheostomy ([place])   Hypokalemia   Consultants: Neurology PCCM Nephrology  Procedures: EEG Echocardiogram a bubble study Core track Tracheostomy Lumbar puncture in interventional radiology  Antibiotics: Acyclovir 11/1 through 11/4 Azithromycin 11/4 through 11/7 Ceftriaxone 11/4 through 11/7 Linezolid 11/4 through 11/5 Unasyn 11/8 through 11/11 Ceftriaxone 11/11 through 11/12 Zosyn 11/17 through 11/19 Cefazolin 11/19 through 11/21 Zosyn 11/22 through 11/28 Vancomycin 11/22 through 11/28   Time spent: 45 minutes    Erin Hearing ANP  [facility] 7 am - 330 pm/M-F for direct patient care and secure chat Please refer to Amion for contact info 31  days

## 2021-11-23 NOTE — Progress Notes (Signed)
Physical Therapy Treatment Patient Details Name: Jeffrey Mullen MRN: [id number] DOB: [birth year] Today's Date: 11/23/2021   History of Present Illness 70 y/o male presented to ED on 10/23/21 after bystanders witnessed patient collapse with associated seizure. Another seizure witnessed in ED. CT head negative. CT C-spine negative. LTM EEG on 11/1 showed 2 seizures from R anterior temporal region. Last seizure noted on EEG on 11/3. Intubated 10/31-11/11. Trach placed 11/11. 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  Seems more motivated after initiating mobility though eager for liquids and able to drink full cup of nectar juice during session as well.  Patient remains appropriate for LTACH at d/c.   Recommendations for follow up therapy are one component of a multi-disciplinary discharge planning process, led by the attending physician.  Recommendations may be updated based on patient status, additional functional criteria and insurance authorization.  Follow Up Recommendations  PT at Long-term acute care hospital     Assistance Recommended at Discharge Frequent or constant Supervision/Assistance  Equipment Recommendations  Other (comment) (TBA)    Recommendations for Other Services       Precautions / Restrictions Precautions Precautions: Fall Precaution Comments: trach, cortrak, rectal tube, condom cath Restrictions Weight Bearing Restrictions: No     Mobility  Bed Mobility Overal bed mobility: Needs Assistance Bed Mobility: Rolling Rolling: Mod assist;+2 for physical assistance   Supine to sit: Max  assist Sit to supine: Mod assist;+2 for physical assistance   General bed mobility comments: rolling in bed for hygiene due to soiled after return to supine; assist for legs off bed and to lift trunk, scooted to EOB on sheet under him, then back to supine with assist for legs and trunk, slid to [facility] with +2 A    Transfers Overall transfer level: Needs assistance Equipment used: Rolling walker (2 wheels) Transfers: Sit to/from Stand Sit to Stand: Mod assist;+2 physical assistance;Max assist           General transfer comment: stood to RW x 2 with lifting help and placing hands on RW    Ambulation/Gait               General Gait Details: unable   Stairs             Wheelchair Mobility    Modified Rankin (Stroke Patients Only) Modified Rankin (Stroke Patients Only) Pre-Morbid Rankin Score: No symptoms Modified Rankin: Severe disability     Balance Overall balance assessment: Needs assistance Sitting-balance support: Feet supported;Bilateral upper extremity supported Sitting balance-Leahy Scale: Poor Sitting balance - Comments: mod A controlling L lateral lean keeping head in midline and cues for forward gaze, upright posture, sitting EOB about 10 minutes Postural control: Posterior lean Standing balance support: Bilateral upper extremity supported Standing balance-Leahy Scale: Poor Standing balance comment: mod A of 2 for standing up to about 15 sec                            Cognition Arousal/Alertness: Awake/alert Behavior During Therapy: Flat affect Overall Cognitive Status: No family/caregiver present to determine baseline cognitive functioning Area of Impairment: Orientation;Attention;Memory;Awareness;Safety/judgement;Following commands  Orientation Level: Disoriented to;Time;Situation Current Attention Level: Sustained Memory: Decreased short-term memory;Decreased recall of precautions Following Commands: Follows  one step commands consistently;Follows one step commands with increased time Safety/Judgement: Decreased awareness of safety;Decreased awareness of deficits Awareness: Intellectual   General Comments: perseverates on liquids        Exercises General Exercises - Upper Extremity Shoulder Flexion: AAROM;Both;5 reps;Seated General Exercises - Lower Extremity Long Arc Quad: AAROM;Both;Seated;5 reps Other Exercises Other Exercises: sitting marches at EOB    General Comments General comments (skin integrity, edema, etc.): rolled in bed for hygiene when back to bed and RN aware to change sacral dressing due to soiled despite flexiseal      Pertinent Vitals/Pain Pain Assessment: No/denies pain Faces Pain Scale: Hurts little more Pain Location: bottom in sitting Pain Descriptors / Indicators: Discomfort;Grimacing Pain Intervention(s): Limited activity within patient's tolerance;Repositioned;Monitored during session    Home Living                          Prior Function            PT Goals (current goals can now be found in the care plan section) Acute Rehab PT Goals PT Goal Formulation: With patient Time For Goal Achievement: 12/04/21 Progress towards PT goals: Progressing toward goals    Frequency    Min 3X/week      PT Plan Current plan remains appropriate    Co-evaluation              AM-PAC PT "6 Clicks" Mobility   Outcome Measure  Help needed turning from your back to your side while in a flat bed without using bedrails?: A Lot Help needed moving from lying on your back to sitting on the side of a flat bed without using bedrails?: Total Help needed moving to and from a bed to a chair (including a wheelchair)?: Total Help needed standing up from a chair using your arms (e.g., wheelchair or bedside chair)?: Total Help needed to walk in hospital room?: Total Help needed climbing 3-5 steps with a railing? : Total 6 Click Score: 7    End of  Session Equipment Utilized During Treatment: Gait belt;Oxygen (trach collar) Activity Tolerance: Patient tolerated treatment well Patient left: in bed;with call bell/phone within reach;with bed alarm set Nurse Communication: Mobility status PT Visit Diagnosis: Other abnormalities of gait and mobility (R26.89);Other symptoms and signs involving the nervous system (R29.898);Muscle weakness (generalized) (M62.81)     Time: 7078-6754 PT Time Calculation (min) (ACUTE ONLY): 24 min  Charges:  $Therapeutic Exercise: 8-22 mins $Therapeutic Activity: 23-37 mins                     Magda Kiel, PT Acute Rehabilitation Services GBEEF:[phone] Office:[phone] 11/23/2021    Reginia Naas 11/23/2021, 6:37 PM

## 2021-11-23 NOTE — Progress Notes (Signed)
   11/23/21 1137  Assess: MEWS Score  Temp 98.5 F (36.9 C)  BP (!) 107/59  Pulse Rate 98  ECG Heart Rate 97  Resp (!) 28  Level of Consciousness Alert  SpO2 95 %  O2 Device Tracheostomy Collar  O2 Flow Rate (L/min) 5 L/min  FiO2 (%) 21 %  Assess: MEWS Score  MEWS Temp 0  MEWS Systolic 0  MEWS Pulse 0  MEWS RR 2  MEWS LOC 0  MEWS Score 2  MEWS Score Color Yellow  Assess: if the MEWS score is Yellow or Red  Were vital signs taken at a resting state? Yes  Focused Assessment No change from prior assessment  Early Detection of Sepsis Score *See Row Information* Medium  MEWS guidelines implemented *See Row Information* Yes  Treat  MEWS Interventions Administered scheduled meds/treatments  Pain Scale 0-10  Pain Score 0  Take Vital Signs  Increase Vital Sign Frequency  Yellow: Q 2hr X 2 then Q 4hr X 2, if remains yellow, continue Q 4hrs  Escalate  MEWS: Escalate Yellow: discuss with charge nurse/RN and consider discussing with provider and RRT  Notify: Charge Nurse/RN  Name of Charge Nurse/RN Notified Janique  Date Charge Nurse/RN Notified 11/23/21  Time Charge Nurse/RN Notified 1150  Document  Patient Outcome Stabilized after interventions

## 2021-11-23 NOTE — Progress Notes (Signed)
Physical Therapy Wound Treatment Patient Details  Name: Jeffrey Mullen MRN: [id number] Date of Birth: [birth year]  Today's Date: 11/23/2021 Time: 4920-1007 Time Calculation (min): 24 min  Subjective  Subjective Assessment Subjective: Pt did not attempt any verbalizations Patient and Family Stated Goals: pt unable to participate Date of Onset:  (unknown) Prior Treatments: dressing changes  Pain Score:  Premedicated. Non signs of pain.  Wound Assessment  Wound / Incision (Open or Dehisced) 10/27/21 Skin tear Arm Left;Lower;Posterior torn blisters with open skin (Active)  Dressing Type Impregnated gauze (bismuth);Gauze (Comment);Normal saline moist dressing 11/23/21 1137  Dressing Changed Changed 11/23/21 1137  Dressing Status Clean;Dry;Intact 11/23/21 1137  Dressing Change Frequency Daily 11/23/21 1137  Site / Wound Assessment Yellow;Red;Brown 11/23/21 1137  % Wound base Red or Granulating 15% 11/23/21 1137  % Wound base Yellow/Fibrinous Exudate 30% 11/23/21 1137  % Wound base Black/Eschar 55% 11/23/21 1137  % Wound base Other/Granulation Tissue (Comment) 0% 11/23/21 1137  Peri-wound Assessment Erythema (blanchable);Purple 11/23/21 1137  Wound Length (cm) 15 cm 11/20/21 1447  Wound Width (cm) 6 cm 11/20/21 1447  Wound Depth (cm) 0 cm 11/20/21 1447  Wound Volume (cm^3) 0 cm^3 11/20/21 1447  Wound Surface Area (cm^2) 90 cm^2 11/20/21 1447  Margins Unattached edges (unapproximated) 11/23/21 1137  Closure None 11/23/21 1137  Drainage Amount Minimal 11/23/21 1137  Drainage Description Serous 11/23/21 1137  Treatment Debridement (Selective);Hydrotherapy (Pulse lavage);Packing (Saline gauze) 11/23/21 1137      Hydrotherapy Pulsed lavage therapy - wound location: L dorsal forearm Pulsed Lavage with Suction (psi): 8 psi (4-12) Pulsed Lavage with Suction - Normal Saline Used: 1000 mL Pulsed Lavage Tip: Tip with splash shield Selective Debridement Selective Debridement - Location:  left dorsal forearm Selective Debridement - Tools Used: Forceps, Scissors Selective Debridement - Tissue Removed: brown/yellow nonviable tissue    Wound Assessment and Plan  Wound Therapy - Assess/Plan/Recommendations Wound Therapy - Clinical Statement: Continue with removal of nonviable tissue through pulsatile lavage and selective debridement. Wound Therapy - Functional Problem List: significant debilitation and low mobility Factors Delaying/Impairing Wound Healing: Altered sensation, Immobility, Substance abuse, Tobacco use Hydrotherapy Plan: Debridement, Dressing change, Patient/family education, Pulsatile lavage with suction Wound Therapy - Frequency: 6X / week (then decrease to 3x/wk) Wound Therapy - Current Recommendations: WOC nurse Wound Therapy - Follow Up Recommendations: dressing changes by RN, Other (comment)  Wound Therapy Goals- Improve the function of patient's integumentary system by progressing the wound(s) through the phases of wound healing (inflammation - proliferation - remodeling) by: Wound Therapy Goals - Improve the function of patient's integumentary system by progressing the wound(s) through the phases of wound healing by: Decrease Necrotic Tissue to: 25% Decrease Necrotic Tissue - Progress: Progressing toward goal Increase Granulation Tissue to: 75% Increase Granulation Tissue - Progress: Progressing toward goal  Goals will be updated until maximal potential achieved or discharge criteria met.  Discharge criteria: when goals achieved, discharge from hospital, MD decision/surgical intervention, no progress towards goals, refusal/missing three consecutive treatments without notification or medical reason.  GP     Charges PT Wound Care Charges $Wound Debridement up to 20 cm: < or equal to 20 cm $ Wound Debridement each add'l 20 sqcm: 3 $PT PLS Gun and Tip: 1 Supply $PT Hydrotherapy Visit: 1 Visit       [name] [facility] 11/23/2021, 11:44 AM [place] Pager [phone] Office [phone]

## 2021-11-23 NOTE — Progress Notes (Signed)
Speech Language Pathology Treatment: Dysphagia  Patient Details Name: Jeffrey Mullen MRN: [id number] DOB: [birth year] Today's Date: 11/23/2021 Time: 6761-9509 SLP Time Calculation (min) (ACUTE ONLY): 15 min  Assessment / Plan / Recommendation Clinical Impression  Pt seen with nectar thick liquids following MBS yesterday recommending puree and nectar thick. Therapist donned speaking valve allowing longer words per breath verbalizing in hoarse quality. Making needs known requesting soda which was thickened to nectar. Hand held steadying assist and volume control provided with immediate subtle throat clear on 20% trials. RN reported coughing some during meals with liquids due to impulsivity. If volume is small there were no coughing episodes. Recommend continue Dys 2,nectar thick, pills crushed. Continue swallow, cognitive and intervention with PMV. Can pt have cuffless trach?    HPI HPI: 70 y/o male presented to ED on 10/23/21 after bystanders witnessed patient collapse with associated seizure. Another seizure witnessed in ED. CT head negative. CT C-spine negative. LTM EEG on 11/1 showed 2 seizures from R anterior temporal region. Last seizure noted on EEG on 11/3. Intubated 10/31-11/11. Trach placed 11/11. 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  Recommendations may be updated based on patient status, additional functional criteria and insurance authorization.    Recommendations  Diet recommendations: Nectar-thick liquid;Dysphagia 1 (puree) Liquids provided via:  Cup;Straw Medication Administration: Crushed with puree Supervision: Staff to assist with self feeding;Full supervision/cueing for compensatory strategies Compensations: Slow rate;Small sips/bites;Minimize environmental distractions;Lingual sweep for clearance of pocketing Postural Changes and/or Swallow Maneuvers: Seated upright 90 degrees;Upright 30-60 min after meal      Patient may use Passy-Muir Speech Valve: During all therapies with supervision PMSV Supervision: Full MD: Please consider changing trach tube to : Cuffless         Oral Care Recommendations: Oral care BID SLP Visit Diagnosis: Aphonia (R49.1);Dysphagia, oropharyngeal phase (R13.12) Plan: Continue with current plan of care       Mullen, Jeffrey Beegle Willis  11/23/2021, 4:09 PM

## 2021-11-23 NOTE — Progress Notes (Signed)
RT called to bedside by RN due to pts trach collar being attached to air flow meter.  RT found pt on 5L 28% and attached to oxygen flow meter. RT placed pt back on 5L 21% and attached to air flow meter. Pt tolerating well and Vitals stable. RT educated RN on pt needing to to be placed on humidified Room air via Trach collar. RT will continue to monitor.

## 2021-11-24 DIAGNOSIS — J9601 Acute respiratory failure with hypoxia: Secondary | ICD-10-CM | POA: Diagnosis not present

## 2021-11-24 DIAGNOSIS — G9340 Encephalopathy, unspecified: Secondary | ICD-10-CM | POA: Diagnosis not present

## 2021-11-24 DIAGNOSIS — T8089XD Other complications following infusion, transfusion and therapeutic injection, subsequent encounter: Secondary | ICD-10-CM | POA: Diagnosis not present

## 2021-11-24 DIAGNOSIS — G40901 Epilepsy, unspecified, not intractable, with status epilepticus: Secondary | ICD-10-CM | POA: Diagnosis not present

## 2021-11-24 LAB — BASIC METABOLIC PANEL
Anion gap: 6 (ref 5–15)
BUN: 29 mg/dL — ABNORMAL HIGH (ref 8–23)
CO2: 24 mmol/L (ref 22–32)
Calcium: 9.5 mg/dL (ref 8.9–10.3)
Chloride: 116 mmol/L — ABNORMAL HIGH (ref 98–111)
Creatinine, Ser: 0.84 mg/dL (ref 0.61–1.24)
GFR, Estimated: >60 mL/min (ref >60)
Glucose, Bld: 126 mg/dL — ABNORMAL HIGH (ref 70–99)
Potassium: 4 mmol/L (ref 3.5–5.1)
Sodium: 146 mmol/L — ABNORMAL HIGH (ref 135–145)

## 2021-11-24 LAB — GLUCOSE, CAPILLARY
Glucose-Capillary: 116 mg/dL — ABNORMAL HIGH (ref 70–99)
Glucose-Capillary: 111 mg/dL — ABNORMAL HIGH (ref 70–99)
Glucose-Capillary: 141 mg/dL — ABNORMAL HIGH (ref 70–99)
Glucose-Capillary: 127 mg/dL — ABNORMAL HIGH (ref 70–99)

## 2021-11-24 MED ORDER — LOPERAMIDE HCL 1 MG/7.5ML PO SUSP
1.0000 mg | Freq: Every day | ORAL | Status: DC
  Administered 2021-11-24 – 2021-12-04 (×11): 1 mg
  Filled 2021-11-24 (×12): qty 7.5

## 2021-11-24 NOTE — Plan of Care (Signed)
  Problem: Clinical Measurements: Goal: Respiratory complications will improve Outcome: Progressing   Problem: Activity: Goal: Risk for activity intolerance will decrease Outcome: Progressing   Problem: Nutrition: Goal: Adequate nutrition will be maintained Outcome: Progressing   

## 2021-11-24 NOTE — Progress Notes (Addendum)
Physical Therapy Treatment Patient Details Name: Jeffrey Mullen MRN: [id number] DOB: January 19, 1951 Today's Date: 11/24/2021   History of Present Illness 70 y/o male presented to ED on 10/23/21 after bystanders witnessed patient collapse with associated seizure. Another seizure witnessed in ED. CT head negative. CT C-spine negative. LTM EEG on 11/1 showed 2 seizures from R anterior temporal region. Last seizure noted on EEG on 11/3. Intubated 10/31-11/11. Trach placed 11/11. 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 Pt performed sitting balance ~5 minutes and STS x2 with maxA+2, emphasis on upright posture and lifting head. Pt requesting thickened cranberry juice at end of session although also fatigued/falling asleep, unit secretary notified as RN/NT in other rooms and pt will need full supervision to drink. Continue to recommend LTACH upon DC. Pt continues to benefit from PT services to progress toward functional mobility goals.       Recommendations for follow up therapy are one component of a multi-disciplinary discharge planning process, led by the attending physician.  Recommendations may be updated based on patient status, additional functional criteria and insurance authorization.  Follow Up Recommendations  PT at Long-term acute care hospital     Assistance Recommended at Discharge Frequent or constant Supervision/Assistance  Equipment Recommendations  Other (comment) (TBD)    Recommendations for Other Services       Precautions / Restrictions Precautions Precautions: Fall Precaution Comments: trach, cortrak, rectal tube, condom cath Restrictions Weight Bearing  Restrictions: No     Mobility  Bed Mobility Overal bed mobility: Needs Assistance Bed Mobility: Rolling Rolling: +2 for physical assistance;Max assist   Supine to sit: Max assist;+2 for physical assistance Sit to supine: +2 for physical assistance;Max assist   General bed mobility comments: rolling in bed and assist for legs off bed and to lift trunk, scooted to EOB on pad under him, then back to supine with assist for legs and trunk    Transfers Overall transfer level: Needs assistance Equipment used: 2 person hand held assist Transfers: Sit to/from Stand Sit to Stand: +2 physical assistance;Max assist   General transfer comment: Stood x2 with +2 HHA with up to maxA for boost and steadying and cues for upright posture, patient with posterior lean in standing, unable to take side steps to [facility] due to unable to weight shift onto R leg to step L leg over, R knee buckles    Ambulation/Gait   General Gait Details: unable   Modified Rankin (Stroke Patients Only) Modified Rankin (Stroke Patients Only) Pre-Morbid Rankin Score: No symptoms Modified Rankin: Severe disability     Balance Overall balance assessment: Needs assistance Sitting-balance support: Feet supported;Bilateral upper extremity supported Sitting balance-Leahy Scale: Poor Sitting balance - Comments: modA to control L lateral lean, upright posture, and lifting head; 5 minutes Postural control: Left lateral lean;Posterior lean Standing balance support: Bilateral upper extremity supported Standing balance-Leahy Scale: Poor Standing balance comment: maxA +2 to stand for 30 sec on first trial and 15 sec on second trial      Cognition Arousal/Alertness: Awake/alert Behavior During Therapy: Flat affect Overall Cognitive Status: No family/caregiver present to determine baseline cognitive functioning Area of Impairment: Orientation;Attention;Memory;Awareness;Safety/judgement;Following commands;Problem solving     Orientation Level: Disoriented to;Time;Situation Current Attention  Level: Sustained Memory: Decreased short-term memory;Decreased recall of precautions Following Commands: Follows one step commands consistently;Follows one step commands with increased time Safety/Judgement: Decreased awareness of safety;Decreased awareness of deficits Awareness: Intellectual Problem Solving: Slow processing General Comments: perseverates on liquids       Exercises Other Exercises Other Exercises: Supine: heel slides x5    General Comments General comments (skin integrity, edema, etc.): VSS on RA      Pertinent Vitals/Pain Pain Assessment: Faces Faces Pain Scale: Hurts little more Pain Location: bottom in sitting Pain Descriptors / Indicators: Discomfort;Grimacing Pain Intervention(s): Monitored during session;Repositioned     PT Goals (current goals can now be found in the care plan section) Acute Rehab PT Goals Patient Stated Goal: unable to state PT Goal Formulation: With patient Time For Goal Achievement: 12/04/21 Progress towards PT goals: Progressing toward goals    Frequency    Min 3X/week      PT Plan Current plan remains appropriate       AM-PAC PT "6 Clicks" Mobility   Outcome Measure  Help needed turning from your back to your side while in a flat bed without using bedrails?: Total Help needed moving from lying on your back to sitting on the side of a flat bed without using bedrails?: Total Help needed moving to and from a bed to a chair (including a wheelchair)?: Total Help needed standing up from a chair using your arms (e.g., wheelchair or bedside chair)?: Total Help needed to walk in hospital room?: Total Help needed climbing 3-5 steps with a railing? : Total 6 Click Score: 6    End of Session Equipment Utilized During Treatment: Gait belt;Other (comment) (trach collar on RA) Activity Tolerance: Patient tolerated treatment well;Patient limited by  lethargy Patient left: in bed;with call bell/phone within reach;with bed alarm set Nurse Communication: Mobility status;Other (comment) (Pt requesting thickened cranberry juice) PT Visit Diagnosis: Other abnormalities of gait and mobility (R26.89);Other symptoms and signs involving the nervous system (R29.898);Muscle weakness (generalized) (M62.81)     Time: 3976-7341 PT Time Calculation (min) (ACUTE ONLY): 31 min  Charges:  $Therapeutic Activity: 8-22 mins $Neuromuscular Re-education: 8-22 mins                     Evelene Croon, Student PTA CI: Carly P., PTA  Carly M Poff 11/24/2021, 5:12 PM

## 2021-11-24 NOTE — Care Management Important Message (Signed)
Important Message  Patient Details  Name: Jeffrey Mullen MRN: [id number] Date of Birth: 1951-11-09   Medicare Important Message Given:  Yes     Hannah Beat 11/24/2021, 3:25 PM

## 2021-11-24 NOTE — Progress Notes (Signed)
[facility] PROGRESS NOTE  Jeffrey Mullen WUJ:[id number] DOB: 1951/05/16 DOA: 10/23/2021 PCP: [name], NP  Status: Remains inpatient appropriate because:  Unsafe discharge plan; currently not medically stable  Barriers to discharge: Social: Patient unable to independently make decisions regarding medical care-guardianship process pending  Clinical: Core track in place, trach tube in place, significantly impaired mobility secondary to right hemiparesis  Level of care:  Progressive   Code Status: DNR Family Communication: Has no family available to assist.  Girlfriend also unable to assist with patient after discharge. DVT prophylaxis: Subcutaneous heparin COVID vaccination status: 02/18/2020, 04/25/2020 and 11/11/2020  HPI: 70 y/o M with past medical history of hypertension, glaucoma who presented to [facility] ER on 10/31 via EMS with reports of altered mental status.  Given  right sided gaze, altered and left sided weakness, code stoke called. The patient was taken emergently to CT for imaging which was negative for LVO.  While in the [place], he had a witnessed seizure.  PCCM called for ICU admission. Chart review shows he was seen in the ER on 10/4 after passing out at the [employer].  Physical exam was negative at the time.  Labs negative.  No further imaging obtained.    Subjective: Alert and begging for something to drink.  Noted to be drinking too fast and with larger volumes and recommended patient.  Had delayed coughing with chocolate beverage drink noted being expectorated from trach.  Objective: Vitals:   11/24/21 0305 11/24/21 0430  BP: 134/72   Pulse: 96   Resp: (!) 22 17  Temp: 98.1 F (36.7 C)   SpO2: 96% 98%    Intake/Output Summary (Last 24 hours) at 11/24/2021 0746 Last data filed at 11/24/2021 0534 Gross per 24 hour  Intake 1433 ml  Output 1700 ml  Net -267 ml   Filed Weights   11/16/21 0500 11/17/21 0408 11/18/21 0423  Weight: 82.8 kg  77.3 kg 73.7 kg    Exam:  Constitutional: NAD, calm, comfortable Respiratory: 6.0 cuffed trach 5 L/room air with 100% O2 saturation, clear to auscultation bilaterally. Normal respiratory effort.   Cardiovascular: Regular rate, S1-S2.  No extremity edema.  Normotensive Abdomen: no tenderness, no masses palpated. Bowel sounds positive.  Cortrack tube in place.  Rectal tube in place with thicker stool with markedly decreased volume Skin: Left arm wound covered with dressing which is clean dry and intact Neurologic: CN 2-12 grossly intact. Sensation intact, Strength 3-4/5 on left, marked decrease strength on right: Arm is 2/5 and leg is 3/5 Psychiatric:  alert and appropriately interactive.  Able to vocalize and follow simple commands-oriented to 2-year only.  He stated that he was in [place] and that he came in because he was "sick"..  Reoriented to place and reason for hospitalization  Assessment/Plan: Acute problems: Acute multifocal bilateral embolic stroke TTE: normal systolic function.  Grade 2 diastolic dysfunction  No embolic source was identified.  TEE was attempted but could not be performed successfully. LDL noted to be 66.  HbA1c 6.0.  Patient is on statin. Currently on aspirin.   Noted to be quite deconditioned.  PT and OT.   Status epilepticus Resolved.   EEG negative Continue Keppra and lacosamide.     Acute metabolic encephalopathy Encephalopathy is likely secondary to all of his acute issues including epilepsy and stroke and acute respiratory failure.   Sepsis likely secondary to tracheitis Trach cultures were positive for MSSA.   Treated with Zosyn >> Ancef followed by  vancomycin and Zosyn due to development of fever.   11/28 LD Zosyn and vancomycin  Thick green secretions per trach on 12/1 so sputum culture ordered and obtained by RT-preliminary culture with rare gram-positive cocci  Tracheostomy dependent Has cuffed trach in place and stable on room air [place]  team following On 11/29 noted with strong cough and is doing well with SLP Hopefully will be a candidate for decannulation in the future   Left cephalic vein superficial thrombophlebitis/left arm extravasation injury Status post phentolamine and nitroglycerin application.   Continue local wound care to include hydrotherapy-had 3 sessions thus far.  Consider surgical consultation if no improvement after 1 week of hydrotherapy      10/23/2021                  11/20/2021   Acute respiratory failure with hypoxia requiring mechanical ventilation/status post tracheostomy Underwent tracheostomy on 11/11.  Required mechanical ventilation.  Currently saturating reasonably well on trach collar.  Pulmonary toilet. Trach care per PCCM.   Hypernatremia/high volume stool output 2/2 free H20 deficit   11/29: Na+ 155 so free H2O increased to300 cc q 4hrs-sodium 146 on 12/2 12/2 initiated daily Imodium to regimen and hopefully rectal tube can be discontinued soon.  Continue Neutra source fiber and Florastor   Essential hypertension Continue amlodipine, carvedilol, clonidine and irbesartan.   Blood pressure essentially controlled  Anemia of critical illness Hemoglobin 10.7 on 11/28   Hypokalemia Resolved with potassium 4.2 as of 49/44   Alcoholic liver cirrhosis PCP notes document history of alcohol abuse and known cirrhosis LFTs stable.  RUQ ultrasound c/w cirrhosis.  Unremarkable appearance of gallbladder and biliary tree was noted. Ammonia level 44 slightly elevated BUN-given current high-volume diarrhea be a candidate for lactulose at this juncture  Acute kidney injury due to contrast-induced ATN Resolved.   Current creatinine 0.93  Nontraumatic rhabdomyolysis Resolved   Goals of care Palliative medicine has been following.  They had been communicating with patient's former significant other.  Apparently this person requested that she no longer wanted to be the surrogate decision maker.   Previously his biological daughter has also mentioned that she does not want to be the primary decision maker.  Social worker to help conduct search for alternative family members/next of kin. After discussions with critical care medicine and with two-physician documentation patient's CODE STATUS was changed to DNR.  See note from 11/27.    Data Reviewed: Basic Metabolic Panel: Recent Labs  Lab 11/18/21 0302 11/19/21 0146 11/20/21 0147 11/21/21 0149 11/22/21 0356 11/23/21 0045 11/24/21 0317  NA 147* 151* 152* 155* 148* 148* 146*  K 3.5 3.6 4.3 4.2 4.0 4.3 4.0  CL 114* 119* 122* 124* 120* 118* 116*  CO2 24 23 21* 21* 20* 22 24  GLUCOSE 137* 151* 128* 130* 134* 116* 126*  BUN 30* 32* 27* 36* 26* 27* 29*  CREATININE 0.77 0.97 0.84 0.93 0.79 0.89 0.84  CALCIUM 9.4 9.4 9.3 9.6 9.4 9.1 9.5  MG  --   --  2.3  --   --   --   --   PHOS 3.7 3.5  --   --   --   --   --    Liver Function Tests: Recent Labs  Lab 11/18/21 0302 11/19/21 0146 11/20/21 0147  AST  --   --  117*  ALT  --   --  168*  ALKPHOS  --   --  321*  BILITOT  --   --  0.7  PROT  --   --  8.0  ALBUMIN 2.3* 2.4* 2.5*    CBC: Recent Labs  Lab 11/20/21 0147  WBC 11.5*  HGB 10.7*  HCT 34.7*  MCV 90.8  PLT 421*    CBG: Recent Labs  Lab 11/23/21 1146 11/23/21 1646 11/23/21 1953 11/23/21 2331 11/24/21 0321  GLUCAP 116* 109* 208* 133* 116*    Recent Results (from the past 240 hour(s))  Culture, Respiratory w Gram Stain     Status: None   Collection Time: 11/14/21 11:45 AM   Specimen: Tracheal Aspirate; Respiratory  Result Value Ref Range Status   Specimen Description TRACHEAL ASPIRATE  Final   Special Requests NONE  Final   Gram Stain   Final    RARE WBC PRESENT, PREDOMINANTLY PMN RARE GRAM POSITIVE COCCI IN PAIRS Performed at [facility], [place] [address] [place], [place] [address]    Culture FEW STAPHYLOCOCCUS AUREUS  Final   Report Status 11/16/2021 FINAL  Final   Organism ID, Bacteria  STAPHYLOCOCCUS AUREUS  Final      Susceptibility   Staphylococcus aureus - MIC*    CIPROFLOXACIN <=0.5 SENSITIVE Sensitive     ERYTHROMYCIN >=8 RESISTANT Resistant     GENTAMICIN <=0.5 SENSITIVE Sensitive     OXACILLIN 0.5 SENSITIVE Sensitive     TETRACYCLINE <=1 SENSITIVE Sensitive     VANCOMYCIN 1 SENSITIVE Sensitive     TRIMETH/SULFA <=10 SENSITIVE Sensitive     CLINDAMYCIN RESISTANT Resistant     RIFAMPIN <=0.5 SENSITIVE Sensitive     Inducible Clindamycin POSITIVE Resistant     * FEW STAPHYLOCOCCUS AUREUS  MRSA Next Gen by PCR, Nasal     Status: None   Collection Time: 11/15/21  8:00 AM   Specimen: Nasal Mucosa; Nasal Swab  Result Value Ref Range Status   MRSA by PCR Next Gen NOT DETECTED NOT DETECTED Final    Comment: (NOTE) The GeneXpert MRSA Assay (FDA approved for NASAL specimens only), is one component of a comprehensive MRSA colonization surveillance program. It is not intended to diagnose MRSA infection nor to guide or monitor treatment for MRSA infections. Test performance is not FDA approved in patients less than 63 years old. Performed at [facility], [place] [address], [place], [place] [address]   Culture, Respiratory w Gram Stain     Status: None (Preliminary result)   Collection Time: 11/23/21 11:50 AM   Specimen: Tracheal Aspirate; Respiratory  Result Value Ref Range Status   Specimen Description TRACHEAL ASPIRATE  Final   Special Requests NONE  Final   Gram Stain   Final    WBC PRESENT,BOTH PMN AND MONONUCLEAR RARE GRAM POSITIVE COCCI Performed at [facility], [place] [address] [place], [place] [address]    Culture PENDING  Incomplete   Report Status PENDING  Incomplete      Scheduled Meds:  amLODipine  10 mg Per Tube Daily   aspirin  81 mg Per Tube Daily   atorvastatin  40 mg Per Tube Daily   carvedilol  12.5 mg Per Tube BID WC   chlorhexidine  15 mL Mouth Rinse BID   cloNIDine  0.1 mg Per Tube TID   collagenase   Topical BID    dorzolamide-timolol  1 drop Right Eye BID   feeding supplement (JEVITY 1.5 CAL/FIBER)  900 mL Per Tube Q24H   feeding supplement (PROSource TF)  45 mL Per Tube QID   fiber  1 packet Per Tube  BID   folic acid  1 mg Per Tube Daily   free water  300 mL Per Tube Q4H   heparin  5,000 Units Subcutaneous Q8H   insulin aspart  0-15 Units Subcutaneous Q4H   irbesartan  75 mg Per Tube Daily   lacosamide  200 mg Per Tube BID   latanoprost  1 drop Right Eye QHS   levETIRAcetam  500 mg Per Tube BID   mouth rinse  15 mL Mouth Rinse q12n4p   multivitamin with minerals  1 tablet Per Tube Daily   nicotine  14 mg Transdermal Daily   nutrition supplement (JUVEN)  1 packet Per Tube BID BM   pantoprazole sodium  40 mg Per Tube QHS   saccharomyces boulardii  250 mg Per Tube BID   scopolamine  1 patch Transdermal Q72H   sodium chloride flush  10-40 mL Intracatheter Q12H   thiamine  100 mg Per Tube Daily   Continuous Infusions:  sodium chloride Stopped (11/07/21 0016)   sodium chloride 5 mL/hr at 11/19/21 0949    Active Problems:   Status epilepticus ([place])   Acute respiratory failure with hypercapnia (H[facility])   Acute encephalopathy   Cryptogenic stroke (H[facility])   Pressure injury of skin   AKI (acute kidney injury) ([place])   Seizure ([place])   Acute respiratory failure with hypoxia ([place])   Status post tracheostomy ([place])   Hypokalemia   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 [facility])   Consultants: Neurology PCCM Nephrology  Procedures: EEG Echocardiogram a bubble study Core track Tracheostomy Lumbar puncture in interventional radiology  Antibiotics: Acyclovir 11/1 through 11/4 Azithromycin 11/4 through 11/7 Ceftriaxone 11/4 through 11/7 Linezolid 11/4 through 11/5 Unasyn 11/8 through 11/11 Ceftriaxone 11/11 through 11/12 Zosyn 11/17 through 11/19 Cefazolin 11/19 through 11/21 Zosyn 11/22 through 11/28 Vancomycin 11/22 through 11/28   Time spent: 45 minutes    Erin Hearing ANP  [facility] 7 am - 330 pm/M-F for direct patient care and secure chat Please refer to Amion for contact info 32  days

## 2021-11-24 NOTE — Plan of Care (Signed)

## 2021-11-24 NOTE — Progress Notes (Addendum)
Physical Therapy Wound Treatment Patient Details  Name: Jeffrey Mullen MRN: [id number] Date of Birth: Feb 15, 1951  Today's Date: 11/24/2021 Time: 4259-5638 Time Calculation (min): 18 min  Subjective  Subjective Assessment Subjective: Pt verbalize something about boots Patient and Family Stated Goals: pt unable to participate Date of Onset:  (unknown) Prior Treatments: dressing changes  Pain Score:  No signs of pain  Wound Assessment  Wound / Incision (Open or Dehisced) 10/27/21 Skin tear Arm Left;Lower;Posterior torn blisters with open skin (Active)  Dressing Type Impregnated gauze (bismuth);Gauze (Comment);Normal saline moist dressing 11/24/21 1148  Dressing Changed Changed 11/24/21 1148  Dressing Status Clean;Dry;Intact 11/24/21 1148  Dressing Change Frequency Daily 11/24/21 1148  Site / Wound Assessment Yellow;Red;Black;Brown 11/24/21 1148  % Wound base Red or Granulating 25% 11/24/21 1148  % Wound base Yellow/Fibrinous Exudate 45% 11/24/21 1148  % Wound base Black/Eschar 30% 11/24/21 1148  % Wound base Other/Granulation Tissue (Comment) 0% 11/24/21 1148  Peri-wound Assessment Erythema (blanchable);Purple 11/24/21 1148  Wound Length (cm) 15 cm 11/20/21 1447  Wound Width (cm) 6 cm 11/20/21 1447  Wound Depth (cm) 0 cm 11/20/21 1447  Wound Volume (cm^3) 0 cm^3 11/20/21 1447  Wound Surface Area (cm^2) 90 cm^2 11/20/21 1447  Margins Attached edges (approximated) 11/24/21 1148  Closure None 11/24/21 1148  Drainage Amount Minimal 11/24/21 1148  Drainage Description Serous 11/24/21 1148  Treatment Debridement (Selective);Hydrotherapy (Pulse lavage);Packing (Saline gauze) 11/24/21 1148      Hydrotherapy Pulsed lavage therapy - wound location: L dorsal forearm Pulsed Lavage with Suction (psi): 8 psi (4-12) Pulsed Lavage with Suction - Normal Saline Used: 1000 mL Pulsed Lavage Tip: Tip with splash shield Selective Debridement Selective Debridement - Location: left dorsal  forearm Selective Debridement - Tools Used: Forceps, Scissors Selective Debridement - Tissue Removed: brown/yellow nonviable tissue    Wound Assessment and Plan  Wound Therapy - Assess/Plan/Recommendations Wound Therapy - Clinical Statement: Pt making steady progress with removal of nonviable tissue of forearm wound. Wound Therapy - Functional Problem List: significant debilitation and low mobility Factors Delaying/Impairing Wound Healing: Altered sensation, Immobility, Substance abuse, Tobacco use Hydrotherapy Plan: Debridement, Dressing change, Patient/family education, Pulsatile lavage with suction Wound Therapy - Frequency: 3X / week Wound Therapy - Current Recommendations: WOC nurse Wound Therapy - Follow Up Recommendations: dressing changes by RN, Other (comment)  Wound Therapy Goals- Improve the function of patient's integumentary system by progressing the wound(s) through the phases of wound healing (inflammation - proliferation - remodeling) by: Wound Therapy Goals - Improve the function of patient's integumentary system by progressing the wound(s) through the phases of wound healing by: Decrease Necrotic Tissue to: 25% Decrease Necrotic Tissue - Progress: Progressing toward goal Increase Granulation Tissue to: 75% Increase Granulation Tissue - Progress: Progressing toward goal  Goals will be updated until maximal potential achieved or discharge criteria met.  Discharge criteria: when goals achieved, discharge from hospital, MD decision/surgical intervention, no progress towards goals, refusal/missing three consecutive treatments without notification or medical reason.  GP     Charges PT Wound Care Charges $Wound Debridement up to 20 cm: < or equal to 20 cm $ Wound Debridement each add'l 20 sqcm: 3 $PT PLS Gun and Tip: 1 Supply $PT Hydrotherapy Visit: 1 Visit       [name] [facility] 11/24/2021, 12:46 PM [name] [place] Pager  [phone] Office [phone]

## 2021-11-24 NOTE — TOC Progression Note (Addendum)
Transition of Care [facility]) - Progression Note    Patient Details  Name: Jeffrey Mullen MRN: [id number] Date of Birth: Aug 04, 1951  Transition of Care [facility]) CM/SW [place], RN Phone Number: 11/24/2021, 10:21 AM  Clinical Narrative:    CM called and spoke with Burgess Estelle, CM at [facility] to have her review the patient's clinicals for LTAC versus SNF placement.  Titus Mould, APS SW continues to follow the patient for needed medical decision power of attorney.  The patient at this time has Cortrak in place with ST and Nutritionist follow the patient for swallowing and transition to puree/nectar-thick diet.    CCM is continuing to follow the patient for tracheostomy/airway needs.  Patient currently has 6.0 cuffed trach with  21% Room air trach collar with use of PMV.  Current barriers to SNF placement include cuffed trach / Cortrak and pending medical healthcare POA for placement.  TOC Leadership is aware and will assist once appropriate LTAC/SNF facility is established for admission.  CM and MSW with DTP Team will continue to follow the patient for LTAC versus SNF placement.  11/24/2021 1215 - CM spoke with Burgess Estelle, CM at [facility] and she states that SNF placement may be more appropriate at [facility] and will alert the [facility] SN[facility] liaison to review the patient's clinicals.  The patient's trach would need to be established for 30 days and needed medical decision-maker would have to be established.  Ralene Bathe, APS SW is continuing to follow the patient for needed guardian.   Expected Discharge Plan: Long Term Acute Care (LTAC) Barriers to Discharge: Continued Medical Work up  Expected Discharge Plan and Services Expected Discharge Plan: Long Term Acute Care (LTAC)   Discharge Planning Services: CM Consult Post Acute Care Choice: Long Term Acute Care (LTAC), [place] (LTAC versus SNF placement) Living arrangements for the past 2  months: Apartment                                       Social Determinants of Health (SDOH) Interventions    Readmission Risk Interventions No flowsheet data found.

## 2021-11-25 DIAGNOSIS — N179 Acute kidney failure, unspecified: Secondary | ICD-10-CM | POA: Diagnosis not present

## 2021-11-25 DIAGNOSIS — R569 Unspecified convulsions: Secondary | ICD-10-CM | POA: Diagnosis not present

## 2021-11-25 LAB — CBC WITH DIFFERENTIAL/PLATELET
Abs Immature Granulocytes: 0.03 10*3/uL (ref 0.00–0.07)
Basophils Absolute: 0 10*3/uL (ref 0.0–0.1)
Basophils Relative: 0 %
Eosinophils Absolute: 0.2 10*3/uL (ref 0.0–0.5)
Eosinophils Relative: 2 %
HCT: 32.4 % — ABNORMAL LOW (ref 39.0–52.0)
Hemoglobin: 10.3 g/dL — ABNORMAL LOW (ref 13.0–17.0)
Immature Granulocytes: 0 %
Lymphocytes Relative: 25 %
Lymphs Abs: 2.1 10*3/uL (ref 0.7–4.0)
MCH: 28.5 pg (ref 26.0–34.0)
MCHC: 31.8 g/dL (ref 30.0–36.0)
MCV: 89.5 fL (ref 80.0–100.0)
Monocytes Absolute: 0.7 10*3/uL (ref 0.1–1.0)
Monocytes Relative: 9 %
Neutro Abs: 5.3 10*3/uL (ref 1.7–7.7)
Neutrophils Relative %: 64 %
Platelets: 322 10*3/uL (ref 150–400)
RBC: 3.62 MIL/uL — ABNORMAL LOW (ref 4.22–5.81)
RDW: 18.5 % — ABNORMAL HIGH (ref 11.5–15.5)
WBC: 8.4 10*3/uL (ref 4.0–10.5)
nRBC: 0 % (ref 0.0–0.2)

## 2021-11-25 LAB — GLUCOSE, CAPILLARY
Glucose-Capillary: 80 mg/dL (ref 70–99)
Glucose-Capillary: 214 mg/dL — ABNORMAL HIGH (ref 70–99)
Glucose-Capillary: 87 mg/dL (ref 70–99)

## 2021-11-25 LAB — COMPREHENSIVE METABOLIC PANEL
ALT: 137 U/L — ABNORMAL HIGH (ref 0–44)
AST: 78 U/L — ABNORMAL HIGH (ref 15–41)
Albumin: 2.5 g/dL — ABNORMAL LOW (ref 3.5–5.0)
Alkaline Phosphatase: 262 U/L — ABNORMAL HIGH (ref 38–126)
Anion gap: 10 (ref 5–15)
BUN: 31 mg/dL — ABNORMAL HIGH (ref 8–23)
CO2: 23 mmol/L (ref 22–32)
Calcium: 9.4 mg/dL (ref 8.9–10.3)
Chloride: 111 mmol/L (ref 98–111)
Creatinine, Ser: 0.86 mg/dL (ref 0.61–1.24)
GFR, Estimated: >60 mL/min (ref >60)
Glucose, Bld: 169 mg/dL — ABNORMAL HIGH (ref 70–99)
Potassium: 4 mmol/L (ref 3.5–5.1)
Sodium: 144 mmol/L (ref 135–145)
Total Bilirubin: 0.5 mg/dL (ref 0.3–1.2)
Total Protein: 8 g/dL (ref 6.5–8.1)

## 2021-11-25 NOTE — Plan of Care (Signed)
  Problem: Education: Goal: Knowledge of General Education information will improve Description: Including pain rating scale, medication(s)/side effects and non-pharmacologic comfort measures 11/25/2021 0801 by Bess Harvest, RN Outcome: Progressing 11/24/2021 1953 by Bess Harvest, RN Outcome: Progressing   Problem: Health Behavior/Discharge Planning: Goal: Ability to manage health-related needs will improve 11/25/2021 0801 by Bess Harvest, RN Outcome: Progressing 11/24/2021 1953 by Bess Harvest, RN Outcome: Progressing   Problem: Clinical Measurements: Goal: Ability to maintain clinical measurements within normal limits will improve 11/25/2021 0801 by Bess Harvest, RN Outcome: Progressing 11/24/2021 1953 by Bess Harvest, RN Outcome: Progressing Goal: Will remain free from infection 11/25/2021 0801 by Bess Harvest, RN Outcome: Progressing 11/24/2021 1953 by Bess Harvest, RN Outcome: Progressing Goal: Diagnostic test results will improve 11/25/2021 0801 by Bess Harvest, RN Outcome: Progressing 11/24/2021 1953 by Bess Harvest, RN Outcome: Progressing Goal: Respiratory complications will improve 11/25/2021 0801 by Bess Harvest, RN Outcome: Progressing 11/24/2021 1953 by Bess Harvest, RN Outcome: Progressing Goal: Cardiovascular complication will be avoided 11/25/2021 0801 by Bess Harvest, RN Outcome: Progressing 11/24/2021 1953 by Bess Harvest, RN Outcome: Progressing   Problem: Activity: Goal: Risk for activity intolerance will decrease 11/25/2021 0801 by Bess Harvest, RN Outcome: Progressing 11/24/2021 1953 by Bess Harvest, RN Outcome: Progressing   Problem: Nutrition: Goal: Adequate nutrition will be maintained 11/25/2021 0801 by Bess Harvest, RN Outcome: Progressing 11/24/2021 1953 by Bess Harvest, RN Outcome: Progressing   Problem: Coping: Goal: Level of anxiety will decrease 11/25/2021 0801 by Bess Harvest, RN Outcome: Progressing 11/24/2021 1953 by Bess Harvest, RN Outcome: Progressing   Problem: Elimination: Goal: Will not experience complications related to bowel motility 11/25/2021 0801 by Bess Harvest, RN Outcome: Progressing 11/24/2021 1953 by Bess Harvest, RN Outcome: Progressing Goal: Will not experience complications related to urinary retention 11/25/2021 0801 by Bess Harvest, RN Outcome: Progressing 11/24/2021 1953 by Bess Harvest, RN Outcome: Progressing   Problem: Pain Managment: Goal: General experience of comfort will improve 11/25/2021 0801 by Bess Harvest, RN Outcome: Progressing 11/24/2021 1953 by Bess Harvest, RN Outcome: Progressing   Problem: Safety: Goal: Ability to remain free from injury will improve 11/25/2021 0801 by Bess Harvest, RN Outcome: Progressing 11/24/2021 1953 by Bess Harvest, RN Outcome: Progressing   Problem: Skin Integrity: Goal: Risk for impaired skin integrity will decrease 11/25/2021 0801 by Bess Harvest, RN Outcome: Progressing 11/24/2021 1953 by Bess Harvest, RN Outcome: Progressing   Problem: Safety: Goal: Non-violent Restraint(s) 11/25/2021 0801 by Bess Harvest, RN Outcome: Progressing 11/24/2021 1953 by Bess Harvest, RN Outcome: Progressing

## 2021-11-25 NOTE — Progress Notes (Signed)
[facility] PROGRESS NOTE  Jeffrey Mullen BPZ:[id number] DOB: 10-19-1951 DOA: 10/23/2021 PCP: [name], NP  Status: Remains inpatient appropriate because:  Unsafe discharge plan; currently not medically stable  Barriers to discharge: Social: Patient unable to independently make decisions regarding medical care-guardianship process pending  Clinical: Core track in place, trach tube in place, significantly impaired mobility secondary to right hemiparesis  Level of care:  Progressive   Code Status: DNR Family Communication: Has no family available to assist.  Girlfriend also unable to assist with patient after discharge. DVT prophylaxis: Subcutaneous heparin COVID vaccination status: 02/18/2020, 04/25/2020 and 11/11/2020  HPI: 70 y/o M with past medical history of hypertension, glaucoma who presented to [facility] ER on 10/31 via EMS with reports of altered mental status.  Given  right sided gaze, altered and left sided weakness, code stoke called. The patient was taken emergently to CT for imaging which was negative for LVO.  While in the [place], he had a witnessed seizure.  PCCM called for ICU admission. Chart review shows he was seen in the ER on 10/4 after passing out at the [employer].  Physical exam was negative at the time.  Labs negative.  No further imaging obtained.    Subjective: Alert and begging for something to drink.  Noted to be drinking too fast and with larger volumes and recommended patient.  Had delayed coughing with chocolate beverage drink noted being expectorated from trach.  Objective: Vitals:   11/25/21 0833 11/25/21 0853  BP: (!) 143/76   Pulse: 97   Resp: 14   Temp: 98.3 F (36.8 C)   SpO2: 100% 100%    Intake/Output Summary (Last 24 hours) at 11/25/2021 0934 Last data filed at 11/25/2021 0350 Gross per 24 hour  Intake 100 ml  Output 2050 ml  Net -1950 ml    Filed Weights   11/16/21 0500 11/17/21 0408 11/18/21 0423  Weight: 82.8 kg  77.3 kg 73.7 kg    Exam:  Constitutional: NAD, calm, comfortable Respiratory: 6.0 cuffed trach 5 L/room air with 100% O2 saturation, clear to auscultation bilaterally. Normal respiratory effort.   Cardiovascular: Regular rate, S1-S2.  No extremity edema.  Normotensive Abdomen: no tenderness, no masses palpated. Bowel sounds positive.  Cortrack tube in place.  Rectal tube in place with thicker stool with markedly decreased volume Skin: Left arm wound covered with dressing which is clean dry and intact Neurologic: CN 2-12 grossly intact. Sensation intact, Strength 3-4/5 on left, marked decrease strength on right: Arm is 2/5 and leg is 3/5 Psychiatric:  alert and appropriately interactive.  Able to vocalize and follow simple commands-oriented to 70-year only.  He stated that he was in [place] and that he came in because he was "sick"..  Reoriented to place and reason for hospitalization  Assessment/Plan: Acute problems: Acute multifocal bilateral embolic stroke TTE: normal systolic function.  Grade 2 diastolic dysfunction  No embolic source was identified.  TEE was attempted but could not be performed successfully. LDL noted to be 66.  HbA1c 6.0.  Patient is on statin. Currently on aspirin.   Noted to be quite deconditioned.  PT and OT.   Status epilepticus Resolved.   EEG negative Continue Keppra and lacosamide.     Acute metabolic encephalopathy Encephalopathy is likely secondary to all of his acute issues including epilepsy and stroke and acute respiratory failure.   Sepsis likely secondary to tracheitis Trach cultures were positive for MSSA.   Treated with Zosyn >> Ancef followed  by vancomycin and Zosyn due to development of fever.   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   Continue local wound care to include hydrotherapy-had 3 sessions thus far.  Consider surgical consultation if no improvement after 1 week of hydrotherapy      10/23/2021                  11/20/2021   Acute respiratory failure with hypoxia requiring mechanical ventilation/status post tracheostomy Underwent tracheostomy on 11/11.  Required mechanical ventilation.  Currently saturating reasonably well on trach collar.  Pulmonary toilet. Trach care per PCCM.   Hypernatremia/high volume stool output 2/2 free H20 deficit   11/29: Na+ 155 so free H2O increased to300 cc q 4hrs-sodium 146 on 12/2 12/2 initiated daily Imodium to regimen and hopefully rectal tube can be discontinued soon.  Continue Neutra source fiber and Florastor   Essential hypertension Continue amlodipine, carvedilol, clonidine and irbesartan.   Blood pressure essentially controlled  Anemia of critical illness Hemoglobin 10.7 on 11/28   Hypokalemia Resolved with potassium 4.2 as of 85/27   Alcoholic liver cirrhosis PCP notes document history of alcohol abuse and known cirrhosis LFTs stable.  RUQ ultrasound c/w cirrhosis.  Unremarkable appearance of gallbladder and biliary tree was noted. Ammonia level 44 slightly elevated BUN-given current high-volume diarrhea be a candidate for lactulose at this juncture  Acute kidney injury due to contrast-induced ATN Resolved.   Current creatinine 0.93  Nontraumatic rhabdomyolysis Resolved   Goals of care Palliative medicine has been following.  They had been communicating with patient's former significant other.  Apparently this person requested that she no longer wanted to be the surrogate decision maker.   Previously his biological daughter has also mentioned that she does not want to be the primary decision maker.  Social worker to help conduct search for alternative family members/next of kin. After discussions with critical care medicine and with two-physician documentation patient's CODE STATUS was changed to DNR.  See note from 11/27.    Data Reviewed: Basic Metabolic Panel: Recent Labs  Lab 11/19/21 0146 11/20/21 0147 11/21/21 0149 11/22/21 0356 11/23/21 0045 11/24/21 0317 11/25/21 0420  NA 151* 152* 155* 148* 148* 146* 144  K 3.6 4.3 4.2 4.0 4.3 4.0 4.0  CL 119* 122* 124* 120* 118* 116* 111  CO2 23 21* 21* 20* 22 24 23   GLUCOSE 151* 128* 130* 134* 116* 126* 169*  BUN 32* 27* 36* 26* 27* 29* 31*  CREATININE 0.97 0.84 0.93 0.79 0.89 0.84 0.86  CALCIUM 9.4 9.3 9.6 9.4 9.1 9.5 9.4  MG  --  2.3  --   --   --   --   --   PHOS 3.5  --   --   --   --   --   --     Liver Function Tests: Recent Labs  Lab 11/19/21 0146 11/20/21 0147 11/25/21 0420  AST  --  117* 78*  ALT  --  168* 137*  ALKPHOS  --  321* 262*  BILITOT  --  0.7 0.5  PROT  --  8.0 8.0  ALBUMIN 2.4* 2.5* 2.5*     CBC: Recent Labs  Lab 11/20/21 0147 11/25/21 0420  WBC 11.5* 8.4  NEUTROABS  --  5.3  HGB 10.7* 10.3*  HCT 34.7* 32.4*  MCV 90.8 89.5  PLT 421* 322     CBG: Recent Labs  Lab 11/23/21 2331 11/24/21 0321 11/24/21 0752 11/24/21 1207 11/24/21 1658  GLUCAP 133* 116* 111* 141* 127*     Recent Results (from the past 240 hour(s))  Culture, Respiratory w Gram Stain     Status: None (Preliminary result)   Collection Time: 11/23/21 11:50 AM   Specimen: Tracheal Aspirate; Respiratory  Result Value Ref Range Status   Specimen Description TRACHEAL ASPIRATE  Final   Special Requests NONE  Final   Gram Stain   Final    WBC PRESENT,BOTH PMN AND MONONUCLEAR RARE GRAM POSITIVE COCCI Performed at [facility], [place] [address] [place], [place] [address]    Culture FEW STAPHYLOCOCCUS  AUREUS  Final   Report Status PENDING  Incomplete       Scheduled Meds:  amLODipine  10 mg Per Tube Daily   aspirin  81 mg Per Tube Daily   atorvastatin  40 mg Per Tube Daily   carvedilol  12.5 mg Per Tube BID WC   chlorhexidine  15 mL Mouth Rinse BID   cloNIDine  0.1 mg Per Tube TID   collagenase   Topical BID   dorzolamide-timolol  1 drop Right Eye BID   feeding supplement (JEVITY 1.5 CAL/FIBER)  900 mL Per Tube Q24H   feeding supplement (PROSource TF)  45 mL Per Tube QID   fiber  1 packet Per Tube BID   folic acid  1 mg Per Tube Daily   free water  300 mL Per Tube Q4H   heparin  5,000 Units Subcutaneous Q8H   insulin aspart  0-15 Units Subcutaneous Q4H   irbesartan  75 mg Per Tube Daily   lacosamide  200 mg Per Tube BID   latanoprost  1 drop Right Eye QHS   levETIRAcetam  500 mg Per Tube BID   loperamide HCl  1 mg Per Tube Daily   mouth rinse  15 mL Mouth Rinse q12n4p   multivitamin with minerals  1 tablet Per Tube Daily   nicotine  14 mg Transdermal Daily   nutrition supplement (JUVEN)  1 packet Per Tube BID BM   pantoprazole sodium  40 mg Per Tube QHS   saccharomyces boulardii  250 mg Per Tube BID   scopolamine  1 patch Transdermal Q72H   sodium chloride flush  10-40 mL Intracatheter Q12H   thiamine  100 mg Per Tube Daily   Continuous Infusions:  sodium chloride Stopped (11/07/21 0016)   sodium chloride 5 mL/hr at 11/19/21 0949    Active Problems:   Status epilepticus ([place])   Acute respiratory failure with hypercapnia (HCC)   Acute encephalopathy   Cryptogenic stroke (HCC)   Pressure injury of skin   AKI (acute kidney injury) ([place])   Seizure ([place])   Acute respiratory failure with hypoxia (HCC)   Status post tracheostomy ([place])   Hypokalemia   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 [facility])   Consultants: Neurology PCCM Nephrology  Procedures: EEG Echocardiogram a bubble study Core track Tracheostomy Lumbar puncture in interventional  radiology  Antibiotics: Acyclovir 11/1 through 11/4 Azithromycin 11/4 through 11/7 Ceftriaxone 11/4 through 11/7 Linezolid 11/4 through 11/5 Unasyn 11/8 through 11/11 Ceftriaxone  11/11 through 11/12 Zosyn 11/17 through 11/19 Cefazolin 11/19 through 11/21 Zosyn 11/22 through 11/28 Vancomycin 11/22 through 11/28   Time spent: 45 minutes    Charlynne Cousins ANP  [facility] 7 am - 330 pm/M-F for direct patient care and secure chat Please refer to Amion for contact info 33  days

## 2021-11-26 DIAGNOSIS — R569 Unspecified convulsions: Secondary | ICD-10-CM | POA: Diagnosis not present

## 2021-11-26 DIAGNOSIS — N179 Acute kidney failure, unspecified: Secondary | ICD-10-CM | POA: Diagnosis not present

## 2021-11-26 LAB — CULTURE, RESPIRATORY W GRAM STAIN

## 2021-11-26 LAB — GLUCOSE, CAPILLARY
Glucose-Capillary: 140 mg/dL — ABNORMAL HIGH (ref 70–99)
Glucose-Capillary: 99 mg/dL (ref 70–99)
Glucose-Capillary: 108 mg/dL — ABNORMAL HIGH (ref 70–99)
Glucose-Capillary: 113 mg/dL — ABNORMAL HIGH (ref 70–99)
Glucose-Capillary: 156 mg/dL — ABNORMAL HIGH (ref 70–99)
Glucose-Capillary: 124 mg/dL — ABNORMAL HIGH (ref 70–99)

## 2021-11-26 NOTE — Progress Notes (Signed)
Rectal tube checked and flushed, no issues, minimal draining around rectum, 200cc output on my shift.

## 2021-11-26 NOTE — Plan of Care (Signed)

## 2021-11-26 NOTE — Progress Notes (Signed)
Wound care completed and dressings changed to left arm and sacral area per order, patient tolerated well. Pt bathed, full linens changed.

## 2021-11-26 NOTE — Progress Notes (Signed)
[facility] PROGRESS NOTE  Jeffrey Mullen JOI:[id number] DOB: [birth year] DOA: 10/23/2021 PCP: [name], NP  Status: Remains inpatient appropriate because:  Unsafe discharge plan; currently not medically stable  Barriers to discharge: Social: Patient unable to independently make decisions regarding medical care-guardianship process pending  Clinical: Core track in place, trach tube in place, significantly impaired mobility secondary to right hemiparesis  Level of care:  Progressive   Code Status: DNR Family Communication: Has no family available to assist.  Girlfriend also unable to assist with patient after discharge. DVT prophylaxis: Subcutaneous heparin COVID vaccination status: 02/18/2020, 04/25/2020 and 11/11/2020  HPI: 70 y/o M with past medical history of hypertension, glaucoma who presented to [facility] ER on 10/31 via EMS with reports of altered mental status.  Given  right sided gaze, altered and left sided weakness, code stoke called. The patient was taken emergently to CT for imaging which was negative for LVO.  While in the [place], he had a witnessed seizure.  PCCM called for ICU admission. Chart review shows he was seen in the ER on 10/4 after passing out at the [employer].  Physical exam was negative at the time.  Labs negative.  No further imaging obtained.    Subjective: Alert and begging for something to drink.  Noted to be drinking too fast and with larger volumes and recommended patient.  Had delayed coughing with chocolate beverage drink noted being expectorated from trach.  Objective: Vitals:   11/26/21 0800 11/26/21 0854  BP: 139/70 129/72  Pulse: 100 (!) 103  Resp: 16 18  Temp: 98.6 F (37 C)   SpO2: 94% 97%    Intake/Output Summary (Last 24 hours) at 11/26/2021 1002 Last data filed at 11/26/2021 0610 Gross per 24 hour  Intake 1400 ml  Output 3100 ml  Net -1700 ml    Filed Weights   11/16/21 0500 11/17/21 0408 11/18/21 0423   Weight: 82.8 kg 77.3 kg 73.7 kg    Exam:  Constitutional: NAD, calm, comfortable Respiratory: 6.0 cuffed trach 5 L/room air with 100% O2 saturation, clear to auscultation bilaterally. Normal respiratory effort.   Cardiovascular: Regular rate, S1-S2.  No extremity edema.  Normotensive Abdomen: no tenderness, no masses palpated. Bowel sounds positive.  Cortrack tube in place.  Rectal tube in place with thicker stool with markedly decreased volume Skin: Left arm wound covered with dressing which is clean dry and intact Neurologic: CN 2-12 grossly intact. Sensation intact, Strength 3-4/5 on left, marked decrease strength on right: Arm is 2/5 and leg is 3/5 Psychiatric:  alert and appropriately interactive.  Able to vocalize and follow simple commands-oriented to 2-year only.  He stated that he was in [place] and that he came in because he was "sick"..  Reoriented to place and reason for hospitalization  Assessment/Plan: Acute problems: Acute multifocal bilateral embolic stroke TTE: normal systolic function.  Grade 2 diastolic dysfunction  No embolic source was identified.  TEE was attempted but could not be performed successfully. LDL noted to be 66.  HbA1c 6.0.  Patient is on statin. Currently on aspirin.   Noted to be quite deconditioned.  PT and OT.   Status epilepticus Resolved.   EEG negative Continue Keppra and lacosamide.     Acute metabolic encephalopathy Encephalopathy is likely secondary to all of his acute issues including epilepsy and stroke and acute respiratory failure.   Sepsis likely secondary to tracheitis Trach cultures were positive for MSSA.   Treated with Zosyn >> Ancef followed  by vancomycin and Zosyn due to development of fever.   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   Continue local wound care to include hydrotherapy-had 3 sessions thus far.  Consider surgical consultation if no improvement after 1 week of hydrotherapy      10/23/2021                  11/20/2021   Acute respiratory failure with hypoxia requiring mechanical ventilation/status post tracheostomy Underwent tracheostomy on 11/11.  Required mechanical ventilation.  Currently saturating reasonably well on trach collar.  Pulmonary toilet. Trach care per PCCM.   Hypernatremia/high volume stool output 2/2 free H20 deficit   11/29: Na+ 155 so free H2O increased to300 cc q 4hrs-sodium 146 on 12/2 12/2 initiated daily Imodium to regimen and hopefully rectal tube can be discontinued soon.  Continue Neutra source fiber and Florastor   Essential hypertension Continue amlodipine, carvedilol, clonidine and irbesartan.   Blood pressure essentially controlled  Anemia of critical illness Hemoglobin 10.7 on 11/28   Hypokalemia Resolved with potassium 4.2 as of 59/93   Alcoholic liver cirrhosis PCP notes document history of alcohol abuse and known cirrhosis LFTs stable.  RUQ ultrasound c/w cirrhosis.  Unremarkable appearance of gallbladder and biliary tree was noted. Ammonia level 44 slightly elevated BUN-given current high-volume diarrhea be a candidate for lactulose at this juncture  Acute kidney injury due to contrast-induced ATN Resolved.   Current creatinine 0.93  Nontraumatic rhabdomyolysis Resolved   New onset of Fever: Check a CBC with Dif., cont to monitor fever curve. Vitals sign stable. Having liq stool.  Goals of care Palliative medicine has been following.  They had been communicating  with patient's former significant other.  Apparently this person requested that she no longer wanted to be the surrogate decision maker.  Previously his biological daughter has also mentioned that she does not want to be the primary decision maker.  Social worker to help conduct search for alternative family members/next of kin. After discussions with critical care medicine and with two-physician documentation patient's CODE STATUS was changed to DNR.  See note from 11/27.    Data Reviewed: Basic Metabolic Panel: Recent Labs  Lab 11/20/21 0147 11/21/21 0149 11/22/21 0356 11/23/21 0045 11/24/21 0317 11/25/21 0420  NA 152* 155* 148* 148* 146* 144  K 4.3 4.2 4.0 4.3 4.0 4.0  CL 122* 124* 120* 118* 116* 111  CO2 21* 21* 20* 22 24 23   GLUCOSE 128* 130* 134* 116* 126* 169*  BUN 27* 36* 26* 27* 29* 31*  CREATININE 0.84 0.93 0.79 0.89 0.84 0.86  CALCIUM 9.3 9.6 9.4 9.1 9.5 9.4  MG 2.3  --   --   --   --   --     Liver Function Tests: Recent Labs  Lab 11/20/21 0147 11/25/21 0420  AST 117* 78*  ALT 168* 137*  ALKPHOS 321* 262*  BILITOT 0.7 0.5  PROT 8.0 8.0  ALBUMIN 2.5* 2.5*     CBC: Recent Labs  Lab 11/20/21 0147 11/25/21 0420  WBC 11.5* 8.4  NEUTROABS  --  5.3  HGB 10.7* 10.3*  HCT 34.7* 32.4*  MCV 90.8 89.5  PLT 421* 322     CBG: Recent Labs  Lab 11/25/21 1721 11/25/21 2019 11/25/21 2324 11/26/21 0345 11/26/21 0928  GLUCAP 80 214* 87 140* 99     Recent Results (from the past 240 hour(s))  Culture, Respiratory w Gram Stain     Status: None   Collection Time: 11/23/21 11:50 AM   Specimen: Tracheal Aspirate; Respiratory  Result Value Ref Range Status   Specimen Description TRACHEAL ASPIRATE  Final   Special Requests NONE  Final   Gram Stain   Final    WBC PRESENT,BOTH PMN AND MONONUCLEAR RARE GRAM POSITIVE COCCI Performed at [facility], [address] [place], [place] [address]    Culture FEW STAPHYLOCOCCUS AUREUS  Final   Report Status  11/26/2021 FINAL  Final   Organism ID, Bacteria STAPHYLOCOCCUS AUREUS  Final      Susceptibility   Staphylococcus aureus - MIC*    CIPROFLOXACIN <=0.5 SENSITIVE Sensitive     ERYTHROMYCIN >=8 RESISTANT Resistant     GENTAMICIN <=0.5 SENSITIVE Sensitive     OXACILLIN 0.5 SENSITIVE Sensitive     TETRACYCLINE <=1 SENSITIVE Sensitive     VANCOMYCIN 1 SENSITIVE Sensitive     TRIMETH/SULFA <=10 SENSITIVE Sensitive     CLINDAMYCIN RESISTANT Resistant     RIFAMPIN <=0.5 SENSITIVE Sensitive     Inducible Clindamycin POSITIVE Resistant     * FEW STAPHYLOCOCCUS AUREUS       Scheduled Meds:  amLODipine  10 mg Per Tube Daily   aspirin  81 mg Per Tube Daily   atorvastatin  40 mg Per Tube Daily   carvedilol  12.5 mg Per Tube BID WC   chlorhexidine  15 mL Mouth Rinse BID   cloNIDine  0.1 mg Per Tube TID   collagenase   Topical BID   dorzolamide-timolol  1 drop Right Eye BID   feeding supplement (JEVITY 1.5 CAL/FIBER)  900 mL Per Tube Q24H   feeding supplement (PROSource TF)  45 mL Per Tube QID   fiber  1 packet Per Tube BID   folic acid  1 mg Per Tube Daily   free water  300 mL Per Tube Q4H   heparin  5,000 Units Subcutaneous Q8H   insulin aspart  0-15 Units Subcutaneous Q4H   irbesartan  75 mg Per Tube Daily   lacosamide  200 mg Per Tube BID   latanoprost  1 drop Right Eye QHS   levETIRAcetam  500 mg Per Tube BID   loperamide HCl  1 mg Per Tube Daily   mouth rinse  15 mL Mouth Rinse q12n4p   multivitamin with minerals  1 tablet Per Tube Daily   nicotine  14 mg Transdermal Daily   nutrition supplement (JUVEN)  1 packet Per Tube BID BM   pantoprazole sodium  40 mg Per Tube QHS   saccharomyces boulardii  250 mg Per Tube BID   scopolamine  1 patch Transdermal Q72H   sodium chloride flush  10-40 mL Intracatheter Q12H   thiamine  100 mg Per Tube Daily   Continuous Infusions:  sodium chloride Stopped (11/07/21 0016)   sodium chloride 5 mL/hr at 11/19/21 0949    Active Problems:    Status epilepticus ([place])   Acute respiratory failure with hypercapnia (HCC)   Acute encephalopathy   Cryptogenic stroke (HCC)   Pressure injury of skin   AKI (acute kidney injury) ([place])  Seizure ([place])   Acute respiratory failure with hypoxia ([place])   Status post tracheostomy ([place])   Hypokalemia   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 [facility])   Consultants: Neurology PCCM Nephrology  Procedures: EEG Echocardiogram a bubble study Core track Tracheostomy Lumbar puncture in interventional radiology  Antibiotics: Acyclovir 11/1 through 11/4 Azithromycin 11/4 through 11/7 Ceftriaxone 11/4 through 11/7 Linezolid 11/4 through 11/5 Unasyn 11/8 through 11/11 Ceftriaxone 11/11 through 11/12 Zosyn 11/17 through 11/19 Cefazolin 11/19 through 11/21 Zosyn 11/22 through 11/28 Vancomycin 11/22 through 11/28   Time spent: 45 minutes    Charlynne Cousins ANP  [facility] 7 am - 330 pm/M-F for direct patient care and secure chat Please refer to Amion for contact info 34  days

## 2021-11-27 ENCOUNTER — Inpatient Hospital Stay (HOSPITAL_COMMUNITY): Payer: Medicare Other

## 2021-11-27 DIAGNOSIS — J9601 Acute respiratory failure with hypoxia: Secondary | ICD-10-CM | POA: Diagnosis not present

## 2021-11-27 DIAGNOSIS — G40901 Epilepsy, unspecified, not intractable, with status epilepticus: Secondary | ICD-10-CM | POA: Diagnosis not present

## 2021-11-27 DIAGNOSIS — T8089XD Other complications following infusion, transfusion and therapeutic injection, subsequent encounter: Secondary | ICD-10-CM | POA: Diagnosis not present

## 2021-11-27 DIAGNOSIS — Z930 Tracheostomy status: Secondary | ICD-10-CM | POA: Diagnosis not present

## 2021-11-27 LAB — GLUCOSE, CAPILLARY
Glucose-Capillary: 175 mg/dL — ABNORMAL HIGH (ref 70–99)
Glucose-Capillary: 88 mg/dL (ref 70–99)
Glucose-Capillary: 126 mg/dL — ABNORMAL HIGH (ref 70–99)
Glucose-Capillary: 140 mg/dL — ABNORMAL HIGH (ref 70–99)
Glucose-Capillary: 170 mg/dL — ABNORMAL HIGH (ref 70–99)
Glucose-Capillary: 141 mg/dL — ABNORMAL HIGH (ref 70–99)
Glucose-Capillary: 141 mg/dL — ABNORMAL HIGH (ref 70–99)
Glucose-Capillary: 173 mg/dL — ABNORMAL HIGH (ref 70–99)
Glucose-Capillary: 134 mg/dL — ABNORMAL HIGH (ref 70–99)
Glucose-Capillary: 130 mg/dL — ABNORMAL HIGH (ref 70–99)
Glucose-Capillary: 113 mg/dL — ABNORMAL HIGH (ref 70–99)

## 2021-11-27 LAB — CBC WITH DIFFERENTIAL/PLATELET
Abs Immature Granulocytes: 0.05 10*3/uL (ref 0.00–0.07)
Basophils Absolute: 0 10*3/uL (ref 0.0–0.1)
Basophils Relative: 0 %
Eosinophils Absolute: 0.2 10*3/uL (ref 0.0–0.5)
Eosinophils Relative: 2 %
HCT: 33.2 % — ABNORMAL LOW (ref 39.0–52.0)
Hemoglobin: 10.8 g/dL — ABNORMAL LOW (ref 13.0–17.0)
Immature Granulocytes: 1 %
Lymphocytes Relative: 33 %
Lymphs Abs: 3 10*3/uL (ref 0.7–4.0)
MCH: 28.7 pg (ref 26.0–34.0)
MCHC: 32.5 g/dL (ref 30.0–36.0)
MCV: 88.3 fL (ref 80.0–100.0)
Monocytes Absolute: 1.1 10*3/uL — ABNORMAL HIGH (ref 0.1–1.0)
Monocytes Relative: 12 %
Neutro Abs: 5 10*3/uL (ref 1.7–7.7)
Neutrophils Relative %: 52 %
Platelets: 242 10*3/uL (ref 150–400)
RBC: 3.76 MIL/uL — ABNORMAL LOW (ref 4.22–5.81)
RDW: 17.6 % — ABNORMAL HIGH (ref 11.5–15.5)
WBC: 9.4 10*3/uL (ref 4.0–10.5)
nRBC: 0 % (ref 0.0–0.2)

## 2021-11-27 LAB — PROCALCITONIN: Procalcitonin: 0.14 ng/mL

## 2021-11-27 NOTE — Progress Notes (Signed)
SLP Cancellation Note  Patient Details Name: Jeffrey Mullen MRN: [id number] DOB: 06/23/1951   Cancelled treatment:       Reason Eval/Treat Not Completed: Patient's level of consciousness. Unable to arouse patient for safe PMV placement/PO intake. SLP to attempt next date schedule permitting.   Sonia Baller, MA, CCC-SLP Speech Therapy

## 2021-11-27 NOTE — Plan of Care (Signed)

## 2021-11-27 NOTE — Consult Note (Signed)
[place] Nurse wound follow up Patient receiving care in [facility] 3W30 Sacral wounds are improving with measurements of 7 cm x 9 cm from the intergluteal fold down to the anus and from left to right buttock. Right buttock is healing and decreasing in size. Left buttock, most of the wound connecting to the intergluteal  fold has healed, still a circular wound that is 50% pink and 50% yellow. Intergluteal fold is 75% yellow, 25% pink. Continue the Santyl twice daily to the buttock wounds and into the intergluteal fold for now. May be able to decrease down to once daily next week. The left heel wound is stable purple measuring 2 x 2. Prevalon boots found in the chair in the room. Placed back on the patients feet. Left arm not assessed today as PT had just completed hydrotherapy and I did not want to remove the dressing. PT will keep [place] updated of any changes recommended in therapy.   WOC will follow weekly  Jocelyn Lamer L. Tamala Julian, MSN, RN, [place], [place], [facility] Wound Treatment Associate Pager [phone]

## 2021-11-27 NOTE — Progress Notes (Signed)
NAME:  Jeffrey Mullen, MRN:  [id number], DOB:  12/06/1951, LOS: 18 ADMISSION DATE:  10/23/2021, CONSULTATION DATE:  10/31 REFERRING MD:  Dr. Dina Rich, CHIEF COMPLAINT:  AMS   History of Present Illness:  70 y/o M who presented to [facility] ER on 10/31 via EMS with reports of altered mental status.  Given  right sided gaze, altered and left sided weakness, code stoke called. The patient was taken emergently to CT for imaging which was negative for LVO.  While in the [place], he had a witnessed seizure.    PCCM called for ICU admission.   Chart review shows he was seen in the ER on 10/4 after passing out at the [employer].  Physical exam was negative at the time.  Labs negative.  No further imaging obtained.   Pertinent  Medical History  Alcohol Use - unclear quantity Anxiety / Depression  Congenital blindness of left eye, cataract on R Hepatitis C+, treated, better in 2016 Seizures   Significant Hospital Events: Including procedures, antibiotic start and stop dates in addition to other pertinent events   10/31 Admit with AMS, seizure, hypertensive emergency, AKI 11/1 MRI brain no acute intracranial process. No etiology is seen for the patient's seizure. 11/02 bedside LP attempted but unsuccessful  11/03  No seizures seen on EEG overnight  11/04 weaning sedation to assess for seizure reoccurrence  11/9 bronchoscopy with noted thick secretions in LLL 11/10 Left PICC placed 11/11 Trach,  Cor Track placed, New Embolic Strokes per MRI 43/32 Post Trach Day 1, some oozing from site, Echo results noted below 11/13 Low grade fever, tachy, LFT's remain elevated with ALT and Alk Phos continuing to rise.  S/p lasix  11/14 weaned PSV 10/5, no changes, foley removed 11/15 on ATC, TEE attempted- unable to pass probe, briefly on Neo, cortrak removed fort TEE, tmax 102.3 11/17 afebrile, NG on cultures, continues to have copious secretions 11/20 fever trend is down, on Ancef 11/22: only tolerating  PSV trials for 3 hours a day; broadened antibiotics to zosyn/vanc due to fever spike; recultured   Last Blood Cx 11/8 Neg Last sputum Cx ( BAL) >> 11/9 >> No growth Tracheal Aspirate 11/8>> Normal [place] Trach asp 11/13 > ngtd 11/13 BC x1 >> ngtd 11/16 trach asp >> GPC and GNR; staph Aureus 11/16 Bcx 2 >> ngtd 11/22-trach culture>>MSSA. Resistant to erythro, clinda, 11/22-MRSA PCR>>neg  MRI Brain 11/11 Interval development of multiple scattered acute ischemic infarcts involving the bilateral cerebral hemispheres and left pons, largest measuring 4.9 cm at the right occipital lobe. A central thromboembolic etiology is suspected given the various vascular distributions involved.  Echo 11/11 ECHO reviewed  - Left ventricle: The cavity size was normal. There was moderate    concentric hypertrophy. Systolic function was normal. The    estimated ejection fraction was in the range of 60% to 65%. Wall    motion was normal; there were no regional wall motion    abnormalities. Features are consistent with a pseudonormal left    ventricular filling pattern, with concomitant abnormal relaxation    and increased filling pressure (grade 2 diastolic dysfunction).  - Aortic valve: There was no significant regurgitation.  - Mitral valve: There was no significant regurgitation.  - Left atrium: The atrium was mildly dilated.  - Right ventricle: Systolic function was normal.  - Atrial septum: No defect or patent foramen ovale was identified.  - Tricuspid valve: There was no significant regurgitation.  - Pulmonic valve: There was  no significant regurgitation.  - Inferior vena cava: The vessel was normal in size. The    respirophasic diameter changes were in the normal range (>= 50%),    consistent with normal central venous pressure.   RUQ limited US 11/13 > Heterogeneous increased hepatic parenchymal echogenicity typical of steatosis or other intrinsic hepatocellular disease. Nodular hepatic  contours suggesting cirrhosis.  Unremarkable sonographic appearance of the gallbladder and biliary tree.  Interim History / Subjective:  Complains of being thirsty; per NA, this is an ongoing issue.  Objective   Blood pressure 124/77, pulse 95, temperature 98.5 F (36.9 C), temperature source Axillary, resp. rate 18, height _0  (1.778 m), weight 73.7 kg, SpO2 100 %.    FiO2 (%):  [21 %] 21 %   Intake/Output Summary (Last 24 hours) at 11/27/2021 1314 Last data filed at 11/27/2021 0600 Gross per 24 hour  Intake 680 ml  Output 1750 ml  Net -1070 ml    Physical Exam: General: chronically ill appearing man lying in bed in NAD HEENT:  [place]/AT, eyes anicteric.  Cataract in the left eye. Neck:  Trach in place w/ bleeding or drainage.  PMV in use. Pulm: Breathing comfortably on trach collar, referred right upper lobe breath sounds, no rales or wheezing.  Very weak cough when prompted.  No spontaneous coughing observed. Cardio: S1-S2, regular rate and rhythm. Abd: Soft, nontender, nondistended. Extremities: No edema, no cyanosis. Neuro: Awake, sluggish with verbal responses, quiet voice. Not moving around much in bed.  Weak cough. Derm: Warm, dry, no rashes.  CXR personally reviewed> no infiltrates, trach in appropriate position  Resolved Hospital Problem list   AKI due to contrast-induced ATN, resolved Nontraumatic rhabdomyolysis, resolved Non-anion Gap Metabolic Acidosis, resolved Assessment & Plan:   Suspected multifocal bilateral emboic stroke 11/11: TEE difficult and Alcohol abuse with prior SDH Status epilepticus-resolved; no seizures on EEG; Sepsis:  Acute hypoxemic respiratory failure, on mechanical ventilation Status post tracheostomy 11/11 Hypernatremia Hypokalemia, resolved Cirrhosis Prediabetes HTN, better controlled now Anemia of critical illness Goals of care: DNR    Pulmonary problem list: Tracheostomy dependence 2/2 encephalopathy s/p acute stroke and status  epilepticus. Slowly neurologically improving, still with very weak cough.  MSSA in sputum culture on 12/1 cx, now febrile.  Concern for possible tracheitis versus less likely pneumonia -Checking procalcitonin chest x-ray today - If persistent fevers, low threshold to start coverage for MSSA. -Continue trach care per protocol.  Continue working with SLP to advance diet. -Due to poor cough mechanics, not a candidate for decannulation at this time. -Anticipate that he will require SNF at discharge   Julian Hy, DO 11/27/21 2:39 PM  Pulmonary & Critical Care

## 2021-11-27 NOTE — TOC Progression Note (Signed)
Transition of Care [facility]) - Progression Note    Patient Details  Name: Jeffrey Mullen MRN: [id number] Date of Birth: 1951-08-19  Transition of Care [facility]) CM/SW [place], RN Phone Number: 11/27/2021, 2:48 PM  Clinical Narrative:    CM called and left a secure voicemail message with Marsh Dolly, SW at [facility] to follow up regarding need for family medical decision-maker versus guardianship.  The patient continues require Cortrak tube for tube feedings and medications.  ST is following for diet progression.  Lissa Merlin, NP is aware and G-tube placement has not been recommended at this point.  CM and MSW with DTP Team continue to follow the patient for discharge needs and SNF placement.   Expected Discharge Plan: [place] Barriers to Discharge: Continued Medical Work up  Expected Discharge Plan and Services Expected Discharge Plan: [place]   Discharge Planning Services: CM Consult Post Acute Care Choice: [place] Living arrangements for the past 2 months: Apartment                                       Social Determinants of Health (SDOH) Interventions    Readmission Risk Interventions No flowsheet data found.

## 2021-11-27 NOTE — Progress Notes (Signed)
Physical Therapy Wound Treatment Patient Details  Name: Jeffrey Mullen MRN: [id number] Date of Birth: January 20, 1951  Today's Date: 11/27/2021 Time: 3419-6222 Time Calculation (min): 40 min  Subjective  Subjective Assessment Subjective: Pt verbalize something about boots Patient and Family Stated Goals: pt unable to participate Date of Onset:  (unknown) Prior Treatments: dressing changes  Pain Score:  2/10 premedicated with Fentanyl  Wound Assessment     Wound / Incision (Open or Dehisced) 10/27/21 Skin tear Arm Left;Lower;Posterior torn blisters with open skin (Active)  Wound Image   11/20/21 1447  Dressing Type Gauze (Comment);Impregnated gauze (bismuth);Moist to moist;Normal saline moist dressing;Santyl 11/27/21 1058  Dressing Changed Changed 11/27/21 1058  Dressing Status Clean;Dry;Intact 11/27/21 1058  Dressing Change Frequency Daily 11/27/21 1058  Site / Wound Assessment Pink;Red;Yellow;Bleeding 11/27/21 1058  % Wound base Red or Granulating 30% 11/27/21 1058  % Wound base Yellow/Fibrinous Exudate 50% 11/27/21 1058  % Wound base Black/Eschar 20% 11/27/21 1058  % Wound base Other/Granulation Tissue (Comment) 0% 11/27/21 1058  Peri-wound Assessment Erythema (blanchable);Intact 11/27/21 1058  Wound Length (cm) 15 cm 11/20/21 1447  Wound Width (cm) 6 cm 11/20/21 1447  Wound Depth (cm) 0 cm 11/20/21 1447  Wound Volume (cm^3) 0 cm^3 11/20/21 1447  Wound Surface Area (cm^2) 90 cm^2 11/20/21 1447  Margins Unattached edges (unapproximated) 11/27/21 1058  Closure None 11/26/21 1200  Drainage Amount Moderate 11/27/21 1058  Drainage Description Serous;Sanguineous 11/27/21 1058  Treatment Cleansed;Debridement (Selective);Hydrotherapy (Pulse lavage);Packing (Saline gauze) 11/27/21 1058      Hydrotherapy Pulsed lavage therapy - wound location: L dorsal forearm Pulsed Lavage with Suction (psi): 8 psi (4-12) Pulsed Lavage with Suction - Normal Saline Used: 1000 mL Pulsed Lavage Tip:  Tip with splash shield Selective Debridement Selective Debridement - Location: left dorsal forearm Selective Debridement - Tools Used: Forceps, Scissors Selective Debridement - Tissue Removed: brown/yellow nonviable tissue    Wound Assessment and Plan  Wound Therapy - Assess/Plan/Recommendations Wound Therapy - Clinical Statement: Pt making steady progress with removal of nonviable tissue of forearm wound. Wound Therapy - Functional Problem List: significant debilitation and low mobility Factors Delaying/Impairing Wound Healing: Altered sensation, Immobility, Substance abuse, Tobacco use Hydrotherapy Plan: Debridement, Dressing change, Patient/family education, Pulsatile lavage with suction Wound Therapy - Frequency: 3X / week Wound Therapy - Current Recommendations: WOC nurse Wound Therapy - Follow Up Recommendations: dressing changes by RN, Other (comment)  Wound Therapy Goals- Improve the function of patient's integumentary system by progressing the wound(s) through the phases of wound healing (inflammation - proliferation - remodeling) by: Wound Therapy Goals - Improve the function of patient's integumentary system by progressing the wound(s) through the phases of wound healing by: Decrease Necrotic Tissue to: 25% Decrease Necrotic Tissue - Progress: Progressing toward goal Increase Granulation Tissue to: 75% Increase Granulation Tissue - Progress: Progressing toward goal Goals/treatment plan/discharge plan were made with and agreed upon by patient/family: No, Patient unable to participate in goals/treatment/discharge plan and family unavailable Time For Goal Achievement: 7 days Wound Therapy - Potential for Goals: Good  Goals will be updated until maximal potential achieved or discharge criteria met.  Discharge criteria: when goals achieved, discharge from hospital, MD decision/surgical intervention, no progress towards goals, refusal/missing three consecutive treatments without  notification or medical reason.  GP     Charges PT Wound Care Charges $ Wound Debridement each add'l 20 sqcm: 3 $PT PLS Gun and Tip: 1 Supply $PT Hydrotherapy Visit: 1 Visit       Tessie Fass [place] 11/27/2021, 11:01  AM 11/27/2021  Ginger Carne., PT Acute Rehabilitation Services [phone]  (pager) [phone]  (office)

## 2021-11-27 NOTE — Progress Notes (Signed)
Physical Therapy Treatment Patient Details Name: Jeffrey Mullen MRN: [id number] DOB: March 16, 1951 Today's Date: 11/27/2021   History of Present Illness 70 y/o male presented to ED on 10/23/21 after bystanders witnessed patient collapse with associated seizure. Another seizure witnessed in ED. CT head negative. CT C-spine negative. LTM EEG on 11/1 showed 2 seizures from R anterior temporal region. Last seizure noted on EEG on 11/3. Intubated 10/31-11/11. Trach placed 11/11. 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 Pt with new awareness of deficits stating "I can't believe I'm this weak." Pt with improved sitting EOB balance and endurance. PT began gait training with side steps to [facility] today. Pt remains unsafe to transfer to chair as pt constantly is sliding down in the bed and would also in the chair posing significant falls risk. Acute PT to cont to follow.    Recommendations for follow up therapy are one component of a multi-disciplinary discharge planning process, led by the attending physician.  Recommendations may be updated based on patient status, additional functional criteria and insurance authorization.  Follow Up Recommendations  PT at Long-term acute care hospital     Assistance Recommended at Discharge Frequent or constant Supervision/Assistance  Equipment Recommendations  Other (comment) (TBD)    Recommendations for Other Services       Precautions / Restrictions Precautions Precautions: Fall Precaution Comments: trach, cortrak, rectal tube, condom cath Restrictions Weight Bearing Restrictions: No     Mobility  Bed Mobility Overal bed  mobility: Needs Assistance Bed Mobility: Rolling Rolling: Max assist   Supine to sit: Max assist;+2 for physical assistance Sit to supine: +2 for physical assistance;Max assist   General bed mobility comments: with tactile cues and max encouragement pt initiated LE movement and use of R UE to aide in transfer, ultiamtely still required maxA for trunk elevation and LE mangement off EOB    Transfers Overall transfer level: Needs assistance Equipment used: 2 person hand held assist Transfers: Sit to/from Stand Sit to Stand: +2 physical assistance;Max assist           General transfer comment: Stood x2 with use of bed pad under bottom and face to face with pt using gait belt as well. pt requiring assist from pad to promote trunk extension, bilat knees blocked, verbal cues to lift head up, pt was able to take 4 steps to [facility] with maxA to weight shift and modA to advance LEs    Ambulation/Gait               General Gait Details: pt was able to complete 4 side steps to [facility] with modA to advance LEs and maxA to promote weightshifting   Stairs             Wheelchair Mobility    Modified Rankin (Stroke Patients Only) Modified Rankin (Stroke Patients Only) Pre-Morbid Rankin Score: No symptoms Modified Rankin: Severe disability     Balance Overall balance assessment: Needs assistance Sitting-balance support: Feet supported;Bilateral upper extremity supported Sitting balance-Leahy Scale: Poor Sitting balance - Comments: modA to control L lateral lean initially then progress to closee min guard, upright posture, and lifting head; 6 minutes Postural control: Left lateral lean;Posterior lean Standing balance  support: Bilateral upper extremity supported Standing balance-Leahy Scale: Poor Standing balance comment: maxA +2 to stand for 30 sec on first trial and 15 sec on second trial                            Cognition Arousal/Alertness: Awake/alert Behavior  During Therapy: Flat affect Overall Cognitive Status: No family/caregiver present to determine baseline cognitive functioning Area of Impairment: Attention;Memory;Awareness;Safety/judgement;Following commands;Problem solving                   Current Attention Level: Sustained Memory: Decreased short-term memory;Decreased recall of precautions Following Commands: Follows one step commands consistently;Follows one step commands with increased time Safety/Judgement: Decreased awareness of safety;Decreased awareness of deficits Awareness: Intellectual Problem Solving: Slow processing;Difficulty sequencing;Requires verbal cues;Requires tactile cues General Comments: perseverates on liquids, pt more interactive today, answered question appropriately and followed commands consistently        Exercises      General Comments General comments (skin integrity, edema, etc.): VSS      Pertinent Vitals/Pain Pain Assessment: Faces Faces Pain Scale: No hurt    Home Living                          Prior Function            PT Goals (current goals can now be found in the care plan section) Acute Rehab PT Goals Patient Stated Goal: unable to state PT Goal Formulation: With patient Time For Goal Achievement: 12/04/21 Potential to Achieve Goals: Fair Progress towards PT goals: Progressing toward goals    Frequency    Min 3X/week      PT Plan Current plan remains appropriate    Co-evaluation              AM-PAC PT "6 Clicks" Mobility   Outcome Measure  Help needed turning from your back to your side while in a flat bed without using bedrails?: Total Help needed moving from lying on your back to sitting on the side of a flat bed without using bedrails?: Total Help needed moving to and from a bed to a chair (including a wheelchair)?: Total Help needed standing up from a chair using your arms (e.g., wheelchair or bedside chair)?: Total Help needed to walk in  hospital room?: Total Help needed climbing 3-5 steps with a railing? : Total 6 Click Score: 6    End of Session Equipment Utilized During Treatment: Gait belt;Other (comment) (trach collar on RA) Activity Tolerance: Patient tolerated treatment well;Patient limited by fatigue Patient left: in bed;with call bell/phone within reach;with bed alarm set Nurse Communication: Mobility status PT Visit Diagnosis: Other abnormalities of gait and mobility (R26.89);Other symptoms and signs involving the nervous system (R29.898);Muscle weakness (generalized) (M62.81)     Time: 7902-4097 PT Time Calculation (min) (ACUTE ONLY): 21 min  Charges:  $Therapeutic Activity: 8-22 mins                     Kittie Plater, PT, DPT Acute Rehabilitation Services Pager #: [phone] Office #: [phone]    Berline Lopes 11/27/2021, 2:46 PM

## 2021-11-27 NOTE — Progress Notes (Addendum)
[facility] PROGRESS NOTE  Jeffrey Mullen CVE:[id number] DOB: 27-Apr-1951 DOA: 10/23/2021 PCP: [name], NP  Status: Remains inpatient appropriate because:  Unsafe discharge plan; currently not medically stable  Barriers to discharge: Social: Patient unable to independently make decisions regarding medical care-guardianship process pending  Clinical: Core track in place, trach tube in place, significantly impaired mobility secondary to right hemiparesis  Level of care:  Progressive   Code Status: DNR Family Communication: Has no family available to assist.  Girlfriend also unable to assist with patient after discharge. DVT prophylaxis: Subcutaneous heparin COVID vaccination status: 02/18/2020, 04/25/2020 and 11/11/2020  HPI: 70 y/o M with past medical history of hypertension, glaucoma who presented to [facility] ER on 10/31 via EMS with reports of altered mental status.  Given  right sided gaze, altered and left sided weakness, code stoke called. The patient was taken emergently to CT for imaging which was negative for LVO.  While in the [place], he had a witnessed seizure.  PCCM called for ICU admission. Chart review shows he was seen in the ER on 10/4 after passing out at the [employer].  Physical exam was negative at the time.  Labs negative.  No further imaging obtained.    Subjective: Awake and alert.  Requesting ginger ale.  Sounds congested in upper airways.  Note thick yellow secretions surrounding trach insertion site on the dressing  Objective: Vitals:   11/27/21 0024 11/27/21 0347  BP: 128/60 (!) 148/72  Pulse: 89 95  Resp: (!) 25 (!) 21  Temp: 99.5 F (37.5 C) 98.7 F (37.1 C)  SpO2: 100% 100%    Intake/Output Summary (Last 24 hours) at 11/27/2021 0736 Last data filed at 11/27/2021 0600 Gross per 24 hour  Intake 690 ml  Output 2050 ml  Net -1360 ml   Filed Weights   11/16/21 0500 11/17/21 0408 11/18/21 0423  Weight: 82.8 kg 77.3 kg 73.7 kg     Exam:  Constitutional: NAD, calm, comfortable Respiratory: 6.0 cuffed trach 5 L/room air with 100% O2 saturation, clear to auscultation bilaterally. Normal respiratory effort.  Thick yellow secretions noted. Cardiovascular: Regular rate, S1-S2.  No extremity edema.  Normotensive Abdomen: Soft and nontender with normoactive bowel sounds.  Cortrack tube in place.  Rectal tube in place with thicker stool with markedly decreased volume Skin: Left arm wound covered with dressing which is clean dry and intact Neurologic: CN 2-12 grossly intact. Sensation intact, Strength 3-4/5 on left, marked decrease strength on right: Arm is 2/5 and leg is 3/5 Psychiatric:  alert and appropriately interactive.  Able to vocalize and follow simple commands-oriented to name and place  Assessment/Plan: Acute problems: Acute multifocal bilateral embolic stroke TTE: normal systolic function.  Grade 2 diastolic dysfunction  No embolic source was identified.  TEE was attempted but could not be performed successfully. LDL noted to be 66.  HbA1c 6.0.  Patient is on statin. Currently on aspirin.   Noted to be quite deconditioned.  PT and OT.   Status epilepticus Resolved.   Continue Keppra and lacosamide.     Acute metabolic encephalopathy Encephalopathy is likely secondary to all of his acute issues including epilepsy and stroke and acute respiratory failure.   Sepsis likely secondary to tracheitis Trach cultures were positive for MSSA.   Treated with Zosyn >> Ancef followed by vancomycin and Zosyn due to development of fever.   11/28 LD Zosyn and vancomycin  Repeat sputum culture on 12 1 with few MSSA resistant to inducible  clindamycin, erythromycin and clindamycin  Tracheostomy dependent Has cuffed trach in place and stable on room air Humphreys team following On 11/29 noted with strong cough and is doing well with SLP Currently secretions are too thick and too frequent to be considered for decannulation    Left cephalic vein superficial thrombophlebitis/left arm extravasation injury Status post phentolamine and nitroglycerin application.   Continue local wound care to include hydrotherapy-had 3 sessions thus far.  Consider surgical consultation if no improvement after 1 week of hydrotherapy      10/23/2021                  11/20/2021   Acute respiratory failure with hypoxia requiring mechanical ventilation/status post tracheostomy Underwent tracheostomy on 11/11.  Required mechanical ventilation.  Currently saturating reasonably well on trach collar.  Pulmonary toilet. Trach care per PCCM.   Hypernatremia/high volume stool output 2/2 free H20 deficit   As of 12/3 sodium had improved to 144 Continue free water per tube as well as fiber supplement/Imodium for diarrhea   Essential hypertension Continue amlodipine, carvedilol 12/5: SBP dipped to 80s after am meds- similar issues oast 48 hrs prompting hold of clonidine-will dc clonidine- since irbesartan at lowest available dose will dc this as well  Blood pressure controlled  Anemia of critical illness Hemoglobin 10.7 on 11/28   Hypokalemia Resolved with potassium 4.2 as of 98/92   Alcoholic liver cirrhosis PCP notes document history of alcohol abuse and known cirrhosis LFTs stable.  RUQ ultrasound c/w cirrhosis.  Unremarkable appearance of gallbladder and biliary tree was noted. Ammonia level 44 in context of slightly elevated BUN-given current high-volume diarrhea not a candidate for lactulose at this juncture  Acute kidney injury due to contrast-induced ATN Resolved.   Current creatinine 0.86  Nontraumatic rhabdomyolysis Resolved   Goals of care Palliative medicine has been following.  They had been communicating with patient's former significant other.  Apparently this person requested that she no longer wanted to be the surrogate decision maker.  Previously his biological daughter has also mentioned that she does not want to  be the primary decision maker.  Social worker to help conduct search for alternative family members/next of kin. After discussions with critical care medicine and with two-physician documentation patient's CODE STATUS was changed to DNR.  See note from 11/27.    Data Reviewed: Basic Metabolic Panel: Recent Labs  Lab 11/21/21 0149 11/22/21 0356 11/23/21 0045 11/24/21 0317 11/25/21 0420  NA 155* 148* 148* 146* 144  K 4.2 4.0 4.3 4.0 4.0  CL 124* 120* 118* 116* 111  CO2 21* 20* 22 24 23   GLUCOSE 130* 134* 116* 126* 169*  BUN 36* 26* 27* 29* 31*  CREATININE 0.93 0.79 0.89 0.84 0.86  CALCIUM 9.6 9.4 9.1 9.5 9.4   Liver Function Tests: Recent Labs  Lab 11/25/21 0420  AST 78*  ALT 137*  ALKPHOS 262*  BILITOT 0.5  PROT 8.0  ALBUMIN 2.5*    CBC: Recent Labs  Lab 11/25/21 0420  WBC 8.4  NEUTROABS 5.3  HGB 10.3*  HCT 32.4*  MCV 89.5  PLT 322    CBG: Recent Labs  Lab 11/26/21 1227 11/26/21 1746 11/26/21 1924 11/26/21 2306 11/27/21 0501  GLUCAP 108* 113* 156* 124* 175*    Recent Results (from the past 240 hour(s))  Culture, Respiratory w Gram Stain     Status: None   Collection Time: 11/23/21 11:50 AM   Specimen: Tracheal Aspirate; Respiratory  Result Value Ref Range Status  Specimen Description TRACHEAL ASPIRATE  Final   Special Requests NONE  Final   Gram Stain   Final    WBC PRESENT,BOTH PMN AND MONONUCLEAR RARE GRAM POSITIVE COCCI Performed at [facility], [place] [address], [place], [place] [address]    Culture FEW STAPHYLOCOCCUS AUREUS  Final   Report Status 11/26/2021 FINAL  Final   Organism ID, Bacteria STAPHYLOCOCCUS AUREUS  Final      Susceptibility   Staphylococcus aureus - MIC*    CIPROFLOXACIN <=0.5 SENSITIVE Sensitive     ERYTHROMYCIN >=8 RESISTANT Resistant     GENTAMICIN <=0.5 SENSITIVE Sensitive     OXACILLIN 0.5 SENSITIVE Sensitive     TETRACYCLINE <=1 SENSITIVE Sensitive     VANCOMYCIN 1 SENSITIVE Sensitive     TRIMETH/SULFA  <=10 SENSITIVE Sensitive     CLINDAMYCIN RESISTANT Resistant     RIFAMPIN <=0.5 SENSITIVE Sensitive     Inducible Clindamycin POSITIVE Resistant     * FEW STAPHYLOCOCCUS AUREUS      Scheduled Meds:  amLODipine  10 mg Per Tube Daily   aspirin  81 mg Per Tube Daily   atorvastatin  40 mg Per Tube Daily   carvedilol  12.5 mg Per Tube BID WC   chlorhexidine  15 mL Mouth Rinse BID   cloNIDine  0.1 mg Per Tube TID   collagenase   Topical BID   dorzolamide-timolol  1 drop Right Eye BID   feeding supplement (JEVITY 1.5 CAL/FIBER)  900 mL Per Tube Q24H   feeding supplement (PROSource TF)  45 mL Per Tube QID   fiber  1 packet Per Tube BID   folic acid  1 mg Per Tube Daily   free water  300 mL Per Tube Q4H   heparin  5,000 Units Subcutaneous Q8H   insulin aspart  0-15 Units Subcutaneous Q4H   irbesartan  75 mg Per Tube Daily   lacosamide  200 mg Per Tube BID   latanoprost  1 drop Right Eye QHS   levETIRAcetam  500 mg Per Tube BID   loperamide HCl  1 mg Per Tube Daily   mouth rinse  15 mL Mouth Rinse q12n4p   multivitamin with minerals  1 tablet Per Tube Daily   nicotine  14 mg Transdermal Daily   nutrition supplement (JUVEN)  1 packet Per Tube BID BM   pantoprazole sodium  40 mg Per Tube QHS   saccharomyces boulardii  250 mg Per Tube BID   scopolamine  1 patch Transdermal Q72H   sodium chloride flush  10-40 mL Intracatheter Q12H   thiamine  100 mg Per Tube Daily   Continuous Infusions:  sodium chloride Stopped (11/07/21 0016)   sodium chloride 5 mL/hr at 11/19/21 0949    Active Problems:   Status epilepticus (HCC)   Acute respiratory failure with hypercapnia (HCC)   Acute encephalopathy   Cryptogenic stroke (HCC)   Pressure injury of skin   AKI (acute kidney injury) ([place])   Seizure (HCC)   Acute respiratory failure with hypoxia (HCC)   Status post tracheostomy ([place])   Hypokalemia   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  [facility])   Consultants: Neurology PCCM Nephrology  Procedures: EEG Echocardiogram a bubble study Core track Tracheostomy Lumbar puncture in interventional radiology  Antibiotics: Acyclovir 11/1 through 11/4 Azithromycin 11/4 through 11/7 Ceftriaxone 11/4 through 11/7 Linezolid 11/4 through 11/5 Unasyn 11/8 through 11/11 Ceftriaxone 11/11 through 11/12 Zosyn 11/17 through 11/19 Cefazolin 11/19 through 11/21 Zosyn 11/22 through 11/28  Vancomycin 11/22 through 11/28   Time spent: 45 minutes    Erin Hearing ANP  [facility] 7 am - 330 pm/M-F for direct patient care and secure chat Please refer to Amion for contact info 35  days

## 2021-11-28 DIAGNOSIS — T8089XD Other complications following infusion, transfusion and therapeutic injection, subsequent encounter: Secondary | ICD-10-CM | POA: Diagnosis not present

## 2021-11-28 DIAGNOSIS — I639 Cerebral infarction, unspecified: Secondary | ICD-10-CM | POA: Diagnosis not present

## 2021-11-28 DIAGNOSIS — Z930 Tracheostomy status: Secondary | ICD-10-CM | POA: Diagnosis not present

## 2021-11-28 DIAGNOSIS — G40901 Epilepsy, unspecified, not intractable, with status epilepticus: Secondary | ICD-10-CM | POA: Diagnosis not present

## 2021-11-28 LAB — GLUCOSE, CAPILLARY
Glucose-Capillary: 107 mg/dL — ABNORMAL HIGH (ref 70–99)
Glucose-Capillary: 114 mg/dL — ABNORMAL HIGH (ref 70–99)
Glucose-Capillary: 156 mg/dL — ABNORMAL HIGH (ref 70–99)
Glucose-Capillary: 161 mg/dL — ABNORMAL HIGH (ref 70–99)
Glucose-Capillary: 179 mg/dL — ABNORMAL HIGH (ref 70–99)
Glucose-Capillary: 96 mg/dL (ref 70–99)

## 2021-11-28 LAB — PROCALCITONIN: Procalcitonin: 0.12 ng/mL

## 2021-11-28 MED ORDER — SULFAMETHOXAZOLE-TRIMETHOPRIM 200-40 MG/5ML PO SUSP
20.0000 mL | Freq: Two times a day (BID) | ORAL | Status: DC
  Administered 2021-11-28 – 2021-12-03 (×12): 20 mL via ORAL
  Filled 2021-11-28 (×15): qty 20

## 2021-11-28 NOTE — Progress Notes (Signed)
Occupational Therapy Treatment Patient Details Name: Jeffrey Mullen MRN: [id number] DOB: 06-20-1951 Today's Date: 11/28/2021   History of present illness 69 y/o male presented to ED on 10/23/21 after bystanders witnessed patient collapse with associated seizure. Another seizure witnessed in ED. CT head negative. CT C-spine negative. LTM EEG on 11/1 showed 2 seizures from R anterior temporal region. Last seizure noted on EEG on 11/3. Intubated 10/31-11/11. Trach placed 11/11. 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 Patient received in bed and required max assist to roll on left to clean following BM and max assist x2 to get to EOB. Patient was able to assist with sitting balance requiring mod assist for balance. Patient performed 2 stands from EOB with max assist +2, and side stepped towards HOB with max assist +2.  Patient was returned to supine and AAROM performed to address cervical ROM.  Acute OT to continue to follow.    Recommendations for follow up therapy are one component of a multi-disciplinary discharge planning process, led by the attending physician.  Recommendations may be updated based on patient status, additional functional criteria and insurance authorization.    Follow Up Recommendations  Skilled nursing-short term rehab (<3 hours/day)    Assistance Recommended at Discharge Frequent or constant Supervision/Assistance  Equipment Recommendations  Wheelchair (measurements OT);Wheelchair cushion (measurements OT);Hospital bed;Other (comment)    Recommendations for Other Services      Precautions / Restrictions Precautions Precautions: Fall Precaution Comments: trach, cortrak, rectal  tube, condom cath       Mobility Bed Mobility Overal bed mobility: Needs Assistance Bed Mobility: Rolling;Sidelying to Sit;Sit to Supine Rolling: Max assist Sidelying to sit: Max assist;+2 for physical assistance   Sit to supine: +2 for physical assistance;Max assist   General bed mobility comments: verbal cues for patient participation with patient intiating but continues to require assistance    Transfers Overall transfer level: Needs assistance Equipment used: 2 person hand held assist Transfers: Sit to/from Stand Sit to Stand: +2 physical assistance;Max assist           General transfer comment: stood x2 with max assist. Verbal cues to correct posture and to straighten legs     Balance Overall balance assessment: Needs assistance Sitting-balance support: Feet supported;Bilateral upper extremity supported Sitting balance-Leahy Scale: Poor Sitting balance - Comments: mod assist for sitting balance due to left lateral lean Postural control: Left lateral lean;Posterior lean Standing balance support: Bilateral upper extremity supported Standing balance-Leahy Scale: Poor Standing balance comment: max Assist +2 to stand from EOB                           ADL either performed or assessed with clinical judgement   ADL Overall ADL's : Needs assistance/impaired             Lower Body Bathing: Maximal assistance Lower Body Bathing Details (indicate cue type and reason): patient's bottom cleaned at bed level                       General ADL Comments: patient rolled to left side to clean bottom    Extremity/Trunk Assessment Upper Extremity Assessment RUE Deficits / Details: actively helping L  UE RUE Sensation: decreased light touch;decreased proprioception RUE Coordination: decreased fine motor;decreased gross motor LUE Sensation: decreased light touch            Vision       Perception     Praxis      Cognition Arousal/Alertness:  Awake/alert Behavior During Therapy: Flat affect Overall Cognitive Status: No family/caregiver present to determine baseline cognitive functioning Area of Impairment: Attention;Memory;Awareness;Safety/judgement;Following commands;Problem solving                 Orientation Level: Disoriented to;Time;Situation Current Attention Level: Sustained Memory: Decreased short-term memory;Decreased recall of precautions Following Commands: Follows one step commands consistently;Follows one step commands with increased time Safety/Judgement: Decreased awareness of safety;Decreased awareness of deficits Awareness: Intellectual Problem Solving: Slow processing;Difficulty sequencing;Requires verbal cues;Requires tactile cues General Comments: states "feels good to sit up"          Exercises Exercises: Other exercises Other Exercises Other Exercises: shoulder shrugs x5 at bed level Other Exercises: AAROM for cervical rotation and left lateral flexion   Shoulder Instructions       General Comments      Pertinent Vitals/ Pain       Pain Assessment: Faces Pain Score: 4  Faces Pain Scale: Hurts little more Pain Location: headache when sitting on EOB, neck pain during ROM exercises Pain Descriptors / Indicators: Discomfort;Grimacing Pain Intervention(s): Monitored during session;Repositioned  Home Living                                          Prior Functioning/Environment              Frequency  Min 2X/week        Progress Toward Goals  OT Goals(current goals can now be found in the care plan section)  Progress towards OT goals: Progressing toward goals  Acute Rehab OT Goals OT Goal Formulation: Patient unable to participate in goal setting Time For Goal Achievement: 12/12/21 Potential to Achieve Goals: Fair ADL Goals Pt Will Perform Grooming: with min assist;sitting Pt Will Perform Upper Body Bathing: with min assist;sitting Additional ADL Goal  #1: Pt will follow 3 step command 25% of session attempts Additional ADL Goal #2: Pt will initate ADL task on L side 50% of attempts Additional ADL Goal #3: Pt will tolerate EOB static sitting min (A) as precursor to adls  Plan Discharge plan remains appropriate;Frequency remains appropriate    Co-evaluation                 AM-PAC OT "6 Clicks" Daily Activity     Outcome Measure   Help from another person eating meals?: A Lot Help from another person taking care of personal grooming?: A Little Help from another person toileting, which includes using toliet, bedpan, or urinal?: A Lot Help from another person bathing (including washing, rinsing, drying)?: A Lot Help from another person to put on and taking off regular upper body clothing?: A Lot Help from another person to put on and taking off regular lower body clothing?: A Lot 6 Click Score: 13    End of Session Equipment Utilized During Treatment: Gait belt;Oxygen  OT Visit Diagnosis: Unsteadiness on feet (R26.81);Muscle weakness (generalized) (M62.81)   Activity Tolerance Patient tolerated treatment well   Patient Left in bed;with call bell/phone within reach;with bed alarm set   Nurse Communication Mobility status  Time: 1278-7183 OT Time Calculation (min): 33 min  Charges: OT General Charges $OT Visit: 1 Visit OT Treatments $Therapeutic Activity: 8-22 mins  Lodema Hong, OTA Acute Rehabilitation Services  Pager [phone] Office [phone]   Trixie Dredge 11/28/2021, 10:16 AM

## 2021-11-28 NOTE — Progress Notes (Addendum)
[facility] PROGRESS NOTE  Jeffrey Mullen MMN:[id number] DOB: December 09, 1951 DOA: 10/23/2021 PCP: [name], NP  Status: Remains inpatient appropriate because:  Unsafe discharge plan; currently not medically stable  Barriers to discharge: Social: Patient unable to independently make decisions regarding medical care-guardianship process pending  Clinical: Core track in place, trach tube in place, significantly impaired mobility secondary to right hemiparesis with therapies rec LTAC  Level of care:  Progressive   Code Status: DNR Family Communication: Has no family available to assist.  Girlfriend also unable to assist with patient after discharge. DVT prophylaxis: Subcutaneous heparin COVID vaccination status: 02/18/2020, 04/25/2020 and 11/11/2020  HPI: 69 y/o M with past medical history of hypertension, glaucoma who presented to [facility] ER on 10/31 via EMS with reports of altered mental status.  Given  right sided gaze, altered and left sided weakness, code stoke called. The patient was taken emergently to CT for imaging which was negative for LVO.  While in the [place], he had a witnessed seizure.  PCCM called for ICU admission. Chart review shows he was seen in the ER on 10/4 after passing out at the [employer].  Physical exam was negative at the time.  Labs negative.  No further imaging obtained.    Subjective: Awakens not as interactive as previous.  Denies difficulty breathing.  Discussed reintroduction of antibiotics to assist with early tracheitis and encourage patient to continue to try to take an oral diet since we are hopeful temporary feeding tube can be discontinued.  Objective: Vitals:   11/28/21 0405 11/28/21 0408  BP: 125/87 127/65  Pulse: 97 96  Resp: (!) 22 20  Temp:  99.7 F (37.6 C)  SpO2: 96% 95%    Intake/Output Summary (Last 24 hours) at 11/28/2021 0744 Last data filed at 11/28/2021 0414 Gross per 24 hour  Intake --  Output 2550 ml  Net  -2550 ml   Filed Weights   11/16/21 0500 11/17/21 0408 11/18/21 0423  Weight: 82.8 kg 77.3 kg 73.7 kg    Exam:  Constitutional: NAD, calm, sleeping but easily awakened Respiratory: 6.0 cuffed trach 5 L/room air with 100% O2 saturation, clear to auscultation bilaterally. Normal respiratory effort.   Cardiovascular: Regular rate, S1-S2.  No extremity edema.  Normotensive Abdomen: Soft and nontender with normoactive bowel sounds.  Cortrack tube in place.  Rectal tube in place with thicker stool with markedly decreased volume Skin: Left arm wound covered with dressing which is clean dry and intact Neurologic: CN 2-12 grossly intact. Sensation intact, Strength 3-4/5 on left, marked decrease strength on right: Arm is 2/5 and leg is 3/5 Psychiatric:  alert and appropriately interactive.  Able to vocalize and follow simple commands-oriented to name and place  Assessment/Plan: Acute problems: Acute multifocal bilateral embolic stroke TTE: normal systolic function.  Grade 2 diastolic dysfunction  No embolic source was identified.  TEE was attempted but could not be performed successfully. LDL noted to be 66.  HbA1c 6.0.  Patient is on statin. Currently on aspirin.   Noted to be quite deconditioned.  PT and OT.   Status epilepticus Resolved.   Continue Keppra and lacosamide.     Acute metabolic encephalopathy Encephalopathy is likely secondary to all of his acute issues including epilepsy and stroke and acute respiratory failure.   Sepsis likely secondary to tracheitis Trach cultures were positive for MSSA.   Treated with Zosyn >> Ancef followed by vancomycin and Zosyn due to development of fever.   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   Continue local wound care to include hydrotherapy-had 3 sessions thus far.  Consider surgical consultation if no improvement after 1 week of hydrotherapy      10/23/2021                  11/20/2021   Acute respiratory failure with hypoxia requiring mechanical ventilation/status post tracheostomy Underwent tracheostomy on 11/11.  Required mechanical ventilation.  Currently saturating reasonably well on trach collar.  Pulmonary toilet. Trach care per PCCM.   Hypernatremia/high volume stool output 2/2 free H20 deficit   As of 12/3 sodium had improved to 144 Continue free water per tube as well as fiber supplement/Imodium for diarrhea  Physical deconditioning Finally aware of how debilitated he is Rec is for LTAC vs trach capable SNF   Essential hypertension Continue amlodipine, carvedilol Blood pressure has been suboptimal over the weekend and on Monday 12/5 but as of today better after adjustments in medications. Blood pressure controlled  Anemia of critical illness Hemoglobin 10.7 on 11/28   Hypokalemia Resolved with potassium 4.2 as of 99/24   Alcoholic liver cirrhosis PCP notes document history of alcohol abuse and known cirrhosis LFTs stable.  RUQ ultrasound c/w cirrhosis.  Unremarkable appearance of gallbladder and biliary tree was noted. Ammonia level 44 in context of slightly elevated BUN-given current high-volume diarrhea not a candidate for lactulose at this juncture  Acute kidney injury  due to contrast-induced ATN Resolved.   Current creatinine 0.86  Nontraumatic rhabdomyolysis Resolved   Goals of care Palliative medicine has been following.  They had been communicating with patient's former significant other.  Apparently this person requested that she no longer wanted to be the surrogate decision maker.  Previously his biological daughter has also mentioned that she does not want to be the primary decision maker.  Social worker to help conduct search for alternative family members/next of kin. After discussions with critical care medicine and with two-physician documentation patient's CODE STATUS was changed to DNR.  See note from 11/27.    Data Reviewed: Basic Metabolic Panel: Recent Labs  Lab 11/22/21 0356 11/23/21 0045 11/24/21 0317 11/25/21 0420  NA 148* 148* 146* 144  K 4.0 4.3 4.0 4.0  CL 120* 118* 116* 111  CO2 20* 22 24 23   GLUCOSE 134* 116* 126* 169*  BUN 26* 27* 29* 31*  CREATININE 0.79 0.89 0.84 0.86  CALCIUM 9.4 9.1 9.5 9.4   Liver Function Tests: Recent Labs  Lab 11/25/21 0420  AST 78*  ALT 137*  ALKPHOS 262*  BILITOT 0.5  PROT 8.0  ALBUMIN 2.5*    CBC: Recent Labs  Lab 11/25/21 0420 11/27/21 0831  WBC 8.4 9.4  NEUTROABS 5.3 5.0  HGB 10.3* 10.8*  HCT 32.4* 33.2*  MCV 89.5 88.3  PLT 322 242  CBG: Recent Labs  Lab 11/27/21 1145 11/27/21 1607 11/27/21 2021 11/28/21 0043 11/28/21 0406  GLUCAP 134* 130* 113* 96 156*    Recent Results (from the past 240 hour(s))  Culture, Respiratory w Gram Stain     Status: None   Collection Time: 11/23/21 11:50 AM   Specimen: Tracheal Aspirate; Respiratory  Result Value Ref Range Status   Specimen Description TRACHEAL ASPIRATE  Final   Special Requests NONE  Final   Gram Stain   Final    WBC PRESENT,BOTH PMN AND MONONUCLEAR RARE GRAM POSITIVE COCCI Performed at [facility], [place] [address] [place], [place] [address]    Culture FEW STAPHYLOCOCCUS AUREUS  Final   Report  Status 11/26/2021 FINAL  Final   Organism ID, Bacteria STAPHYLOCOCCUS AUREUS  Final      Susceptibility   Staphylococcus aureus - MIC*    CIPROFLOXACIN <=0.5 SENSITIVE Sensitive     ERYTHROMYCIN >=8 RESISTANT Resistant     GENTAMICIN <=0.5 SENSITIVE Sensitive     OXACILLIN 0.5 SENSITIVE Sensitive     TETRACYCLINE <=1 SENSITIVE Sensitive     VANCOMYCIN 1 SENSITIVE Sensitive     TRIMETH/SULFA <=10 SENSITIVE Sensitive     CLINDAMYCIN RESISTANT Resistant     RIFAMPIN <=0.5 SENSITIVE Sensitive     Inducible Clindamycin POSITIVE Resistant     * FEW STAPHYLOCOCCUS AUREUS  Culture, blood (Routine X 2) w Reflex to ID Panel     Status: None (Preliminary result)   Collection Time: 11/26/21 10:10 AM   Specimen: BLOOD RIGHT HAND  Result Value Ref Range Status   Specimen Description BLOOD RIGHT HAND  Final   Special Requests   Final    BOTTLES DRAWN AEROBIC AND ANAEROBIC Blood Culture adequate volume   Culture   Final    NO GROWTH < 24 HOURS Performed at [facility], [address] [place], [place] [address]    Report Status PENDING  Incomplete  Culture, blood (Routine X 2) w Reflex to ID Panel     Status: None (Preliminary result)   Collection Time: 11/26/21 10:25 AM   Specimen: BLOOD RIGHT HAND  Result Value Ref Range Status   Specimen Description BLOOD RIGHT HAND  Final   Special Requests   Final    BOTTLES DRAWN AEROBIC AND ANAEROBIC Blood Culture adequate volume   Culture   Final    NO GROWTH < 24 HOURS Performed at [facility], [place] [address], [place], [place] [address]    Report Status PENDING  Incomplete      Scheduled Meds:  amLODipine  10 mg Per Tube Daily   aspirin  81 mg Per Tube Daily   atorvastatin  40 mg Per Tube Daily   carvedilol  12.5 mg Per Tube BID WC   chlorhexidine  15 mL Mouth Rinse BID   collagenase   Topical BID   dorzolamide-timolol  1 drop Right Eye BID   feeding supplement (JEVITY 1.5 CAL/FIBER)  900 mL Per Tube Q24H   feeding supplement  (PROSource TF)  45 mL Per Tube QID   fiber  1 packet Per Tube BID   folic acid  1 mg Per Tube Daily   free water  300 mL Per Tube Q4H   heparin  5,000 Units Subcutaneous Q8H   insulin aspart  0-15 Units Subcutaneous Q4H   lacosamide  200 mg Per Tube BID   latanoprost  1 drop Right Eye QHS   levETIRAcetam  500  mg Per Tube BID   loperamide HCl  1 mg Per Tube Daily   mouth rinse  15 mL Mouth Rinse q12n4p   multivitamin with minerals  1 tablet Per Tube Daily   nicotine  14 mg Transdermal Daily   nutrition supplement (JUVEN)  1 packet Per Tube BID BM   pantoprazole sodium  40 mg Per Tube QHS   saccharomyces boulardii  250 mg Per Tube BID   scopolamine  1 patch Transdermal Q72H   sodium chloride flush  10-40 mL Intracatheter Q12H   sulfamethoxazole-trimethoprim  20 mL Oral Q12H   thiamine  100 mg Per Tube Daily   Continuous Infusions:  sodium chloride Stopped (11/07/21 0016)   sodium chloride 5 mL/hr at 11/19/21 0949    Active Problems:   Status epilepticus (HCC)   Acute respiratory failure with hypercapnia (HCC)   Acute encephalopathy   Cryptogenic stroke (HCC)   Pressure injury of skin   AKI (acute kidney injury) ([place])   Seizure ([place])   Acute respiratory failure with hypoxia ([place])   Status post tracheostomy ([place])   Hypokalemia   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 [facility])   Consultants: Neurology PCCM Nephrology  Procedures: EEG Echocardiogram a bubble study Core track Tracheostomy Lumbar puncture in interventional radiology  Antibiotics: Acyclovir 11/1 through 11/4 Azithromycin 11/4 through 11/7 Ceftriaxone 11/4 through 11/7 Linezolid 11/4 through 11/5 Unasyn 11/8 through 11/11 Ceftriaxone 11/11 through 11/12 Zosyn 11/17 through 11/19 Cefazolin 11/19 through 11/21 Zosyn 11/22 through 11/28 Vancomycin 11/22 through 11/28   Time spent: 30 minutes    Erin Hearing ANP  [facility] 7 am - 330 pm/M-F for direct patient care and secure  chat Please refer to Amion for contact info 36  days

## 2021-11-28 NOTE — Plan of Care (Signed)
  Problem: Education: Goal: Knowledge of General Education information will improve Description Including pain rating scale, medication(s)/side effects and non-pharmacologic comfort measures Outcome: Progressing   

## 2021-11-28 NOTE — TOC Progression Note (Addendum)
Transition of Care [facility]) - Progression Note    Patient Details  Name: Jeffrey Mullen MRN: [id number] Date of Birth: 03/30/1951  Transition [facility] CM/SW [place], RN Phone Number: 11/28/2021, 9:14 AM  Clinical Narrative:    CM called and spoke with Titus Mould, SW with [facility] [phone] and she states that she will continue to explore options for family availability for medical decision maker and will follow up with TOC.  The patient will need LTAC versus SNF placement at this time.  The patient has a Cortrak in place at this time.  The patient will need an available medical decision-maker before patient can be transfer to an accepting SNF versus LTAC facility.  I called and left a message with Arley Phenix, CM with [facility] to continue to follow the patient for placement needs.  Message was also left with Cassandra, CM at [facility] to check on bed availability at [facility].  Expected Discharge Plan: [place] Barriers to Discharge: Continued Medical Work up  Expected Discharge Plan and Services Expected Discharge Plan: [place]   Discharge Planning Services: CM Consult Post Acute Care Choice: [place] Living arrangements for the past 2 months: Apartment                                       Social Determinants of Health (SDOH) Interventions    Readmission Risk Interventions No flowsheet data found.

## 2021-11-29 DIAGNOSIS — R1312 Dysphagia, oropharyngeal phase: Secondary | ICD-10-CM

## 2021-11-29 DIAGNOSIS — Z930 Tracheostomy status: Secondary | ICD-10-CM | POA: Diagnosis not present

## 2021-11-29 DIAGNOSIS — T8089XD Other complications following infusion, transfusion and therapeutic injection, subsequent encounter: Secondary | ICD-10-CM | POA: Diagnosis not present

## 2021-11-29 DIAGNOSIS — G40901 Epilepsy, unspecified, not intractable, with status epilepticus: Secondary | ICD-10-CM | POA: Diagnosis not present

## 2021-11-29 DIAGNOSIS — J9601 Acute respiratory failure with hypoxia: Secondary | ICD-10-CM | POA: Diagnosis not present

## 2021-11-29 LAB — GLUCOSE, CAPILLARY
Glucose-Capillary: 129 mg/dL — ABNORMAL HIGH (ref 70–99)
Glucose-Capillary: 138 mg/dL — ABNORMAL HIGH (ref 70–99)
Glucose-Capillary: 138 mg/dL — ABNORMAL HIGH (ref 70–99)
Glucose-Capillary: 141 mg/dL — ABNORMAL HIGH (ref 70–99)
Glucose-Capillary: 183 mg/dL — ABNORMAL HIGH (ref 70–99)
Glucose-Capillary: 89 mg/dL (ref 70–99)

## 2021-11-29 LAB — PROCALCITONIN: Procalcitonin: 0.13 ng/mL

## 2021-11-29 NOTE — Progress Notes (Signed)
[facility] PROGRESS NOTE  Jeffrey Mullen MPN:[id number] DOB: September 22, 1951 DOA: 10/23/2021 PCP: [name], NP  Status: Remains inpatient appropriate because:  Unsafe discharge plan; currently not medically stable  Barriers to discharge: Social: Patient unable to independently make decisions regarding medical care-guardianship process pending  Clinical: Core track in place, trach tube in place, significantly impaired mobility secondary to right hemiparesis with therapies rec LTAC  Level of care:  Progressive   Code Status: DNR Family Communication: Has no family available to assist.  Girlfriend also unable to assist with patient after discharge. DVT prophylaxis: Subcutaneous heparin COVID vaccination status: 02/18/2020, 04/25/2020 and 11/11/2020  HPI: 70 y/o M with past medical history of hypertension, glaucoma who presented to [facility] ER on 10/31 via EMS with reports of altered mental status.  Given  right sided gaze, altered and left sided weakness, code stoke called. The patient was taken emergently to CT for imaging which was negative for LVO.  While in the [employer], he had a witnessed seizure.  PCCM called for ICU admission. Chart review shows he was seen in the ER on 10/4 after passing out at the [employer].  Physical exam was negative at the time.  Labs negative.  No further imaging obtained.    Subjective: Awake but seems more confused today telling me he needs to have a bowel movement despite having had rectal tube in for multiple days.  Also asking to have his "slippers" taken off referring to the [employer] offloading boots.  States he feels like his breathing is better and no coughing.  Objective: Vitals:   11/29/21 0326 11/29/21 0352  BP:  (!) 119/55  Pulse: 97 92  Resp: (!) 24 19  Temp:  99.2 F (37.3 C)  SpO2: 98% 97%    Intake/Output Summary (Last 24 hours) at 11/29/2021 0086 Last data filed at 11/29/2021 0600 Gross per 24 hour  Intake 240 ml  Output  2100 ml  Net -1860 ml   Filed Weights   11/16/21 0500 11/17/21 0408 11/18/21 0423  Weight: 82.8 kg 77.3 kg 73.7 kg    Exam:  Constitutional: NAD, calm Respiratory: 6.0 cuffed trach 5 L/room air with 100% O2 saturation, clear to auscultation bilaterally. Normal respiratory effort.  No tracheal secretions noted Cardiovascular: Regular rate, S1-S2.  No extremity edema.  Normotensive Abdomen: Soft and nontender with normoactive bowel sounds.  Cortrack tube in place.  Rectal tube in place with thicker stool with markedly decreased volume Skin: Left arm wound covered with dressing which is clean dry and intact Neurologic: CN 2-12 grossly intact. Sensation intact, Strength 3-4/5 on left, marked decrease strength on right: Arm is 2/5 and leg is 3/5 Psychiatric:  alert and appropriately interactive.  Able to vocalize and follow simple commands-oriented to name and place  Assessment/Plan: Acute problems: Acute multifocal bilateral embolic stroke TTE: normal systolic function.  Grade 2 diastolic dysfunction  No embolic source was identified.  TEE was attempted but could not be performed successfully. LDL noted to be 66.  HbA1c 6.0.  Patient is on statin. Currently on aspirin.   Noted to be quite deconditioned.  PT and OT.   Status epilepticus Resolved.   Continue Keppra and lacosamide.     Acute metabolic encephalopathy Encephalopathy is likely secondary to all of his acute issues including epilepsy and stroke and acute respiratory failure.   Sepsis likely secondary to tracheitis Trach cultures were positive for MSSA.   Treated with Zosyn >> Ancef followed by vancomycin and Zosyn  due to development of fever.  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   Continue local wound /hydrotherapy noting significant improvement in appearance of the wound as of 12/7      10/23/2021                  11/20/2021   11/29/2021   Acute respiratory failure with hypoxia requiring mechanical ventilation/status post tracheostomy Underwent tracheostomy on 11/11.  Required mechanical ventilation.  Currently saturating reasonably well on trach collar.  Pulmonary toilet. Trach care per PCCM.   Hypernatremia/high volume stool output 2/2 free H20 deficit   As of 12/3 sodium had improved to 144 Continue free water per tube as well as fiber supplement/Imodium for diarrhea  Physical deconditioning Finally aware of how debilitated he is Rec is for LTAC vs trach capable SNF   Essential hypertension Continue amlodipine, carvedilol Blood pressure has been suboptimal over the weekend and on Monday 12/5 but as of today better after adjustments in medications. Blood pressure controlled  Anemia of critical illness Hemoglobin 10.7 on 11/28   Hypokalemia Resolved with potassium 4.2 as of 41/66   Alcoholic liver cirrhosis PCP notes document history of alcohol abuse and known cirrhosis LFTs stable.  RUQ ultrasound c/w cirrhosis.  Unremarkable appearance of gallbladder and biliary tree was noted. Ammonia level 44 in context of slightly elevated BUN-given current high-volume diarrhea not a candidate for lactulose at this juncture  Acute kidney injury due to contrast-induced ATN Resolved.   Current creatinine 0.86  Nontraumatic rhabdomyolysis Resolved   Goals of care Palliative medicine  has been following.  They had been communicating with patient's former significant other.  Apparently this person requested that she no longer wanted to be the surrogate decision maker.  Previously his biological daughter has also mentioned that she does not want to be the primary decision maker.  Social worker to help conduct search for alternative family members/next of kin. After discussions with critical care medicine and with two-physician documentation patient's CODE STATUS was changed to DNR.  See note from 11/27.    Data Reviewed: Basic Metabolic Panel: Recent Labs  Lab 11/23/21 0045 11/24/21 0317 11/25/21 0420  NA 148* 146* 144  K 4.3 4.0 4.0  CL 118* 116* 111  CO2 22 24 23   GLUCOSE 116* 126* 169*  BUN 27* 29* 31*  CREATININE 0.89 0.84 0.86  CALCIUM 9.1 9.5 9.4   Liver Function Tests: Recent Labs  Lab 11/25/21 0420  AST 78*  ALT 137*  ALKPHOS 262*  BILITOT 0.5  PROT 8.0  ALBUMIN 2.5*    CBC: Recent Labs  Lab 11/25/21 0420 11/27/21 0831  WBC 8.4 9.4  NEUTROABS 5.3 5.0  HGB 10.3* 10.8*  HCT 32.4* 33.2*  MCV 89.5 88.3  PLT 322 242    CBG: Recent Labs  Lab 11/28/21 1334 11/28/21 1626 11/28/21 2007 11/29/21 0002 11/29/21 0343  GLUCAP 161* 107* 179* 141* 138*    Recent  Results (from the past 240 hour(s))  Culture, Respiratory w Gram Stain     Status: None   Collection Time: 11/23/21 11:50 AM   Specimen: Tracheal Aspirate; Respiratory  Result Value Ref Range Status   Specimen Description TRACHEAL ASPIRATE  Final   Special Requests NONE  Final   Gram Stain   Final    WBC PRESENT,BOTH PMN AND MONONUCLEAR RARE GRAM POSITIVE COCCI Performed at [facility], [place] [address], [place], [place] [address]    Culture FEW STAPHYLOCOCCUS AUREUS  Final   Report Status 11/26/2021 FINAL  Final   Organism ID, Bacteria STAPHYLOCOCCUS AUREUS  Final      Susceptibility   Staphylococcus aureus - MIC*    CIPROFLOXACIN <=0.5 SENSITIVE Sensitive      ERYTHROMYCIN >=8 RESISTANT Resistant     GENTAMICIN <=0.5 SENSITIVE Sensitive     OXACILLIN 0.5 SENSITIVE Sensitive     TETRACYCLINE <=1 SENSITIVE Sensitive     VANCOMYCIN 1 SENSITIVE Sensitive     TRIMETH/SULFA <=10 SENSITIVE Sensitive     CLINDAMYCIN RESISTANT Resistant     RIFAMPIN <=0.5 SENSITIVE Sensitive     Inducible Clindamycin POSITIVE Resistant     * FEW STAPHYLOCOCCUS AUREUS  Culture, blood (Routine X 2) w Reflex to ID Panel     Status: None (Preliminary result)   Collection Time: 11/26/21 10:10 AM   Specimen: BLOOD RIGHT HAND  Result Value Ref Range Status   Specimen Description BLOOD RIGHT HAND  Final   Special Requests   Final    BOTTLES DRAWN AEROBIC AND ANAEROBIC Blood Culture adequate volume   Culture   Final    NO GROWTH 2 DAYS Performed at [facility], [address], [place], [place] [address]    Report Status PENDING  Incomplete  Culture, blood (Routine X 2) w Reflex to ID Panel     Status: None (Preliminary result)   Collection Time: 11/26/21 10:25 AM   Specimen: BLOOD RIGHT HAND  Result Value Ref Range Status   Specimen Description BLOOD RIGHT HAND  Final   Special Requests   Final    BOTTLES DRAWN AEROBIC AND ANAEROBIC Blood Culture adequate volume   Culture   Final    NO GROWTH 2 DAYS Performed at [facility], [place] [address], [place], [place] [address]    Report Status PENDING  Incomplete      Scheduled Meds:  amLODipine  10 mg Per Tube Daily   aspirin  81 mg Per Tube Daily   atorvastatin  40 mg Per Tube Daily   carvedilol  12.5 mg Per Tube BID WC   chlorhexidine  15 mL Mouth Rinse BID   collagenase   Topical BID   dorzolamide-timolol  1 drop Right Eye BID   feeding supplement (JEVITY 1.5 CAL/FIBER)  900 mL Per Tube Q24H   feeding supplement (PROSource TF)  45 mL Per Tube QID   fiber  1 packet Per Tube BID   folic acid  1 mg Per Tube Daily   free water  300 mL Per Tube Q4H   heparin  5,000 Units Subcutaneous Q8H   insulin  aspart  0-15 Units Subcutaneous Q4H   lacosamide  200 mg Per Tube BID   latanoprost  1 drop Right Eye QHS   levETIRAcetam  500 mg Per Tube BID   loperamide HCl  1 mg Per Tube Daily   mouth rinse  15 mL Mouth Rinse q12n4p   multivitamin with  minerals  1 tablet Per Tube Daily   nicotine  14 mg Transdermal Daily   nutrition supplement (JUVEN)  1 packet Per Tube BID BM   pantoprazole sodium  40 mg Per Tube QHS   saccharomyces boulardii  250 mg Per Tube BID   scopolamine  1 patch Transdermal Q72H   sodium chloride flush  10-40 mL Intracatheter Q12H   sulfamethoxazole-trimethoprim  20 mL Oral Q12H   thiamine  100 mg Per Tube Daily   Continuous Infusions:  sodium chloride Stopped (11/07/21 0016)   sodium chloride 5 mL/hr at 11/19/21 0949    Active Problems:   Status epilepticus (HCC)   Acute respiratory failure with hypercapnia (HCC)   Acute encephalopathy   Cryptogenic stroke (HCC)   Pressure injury of skin   AKI (acute kidney injury) ([place])   Seizure ([place])   Acute respiratory failure with hypoxia (HCC)   Status post tracheostomy ([place])   Hypokalemia   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 [facility])   Consultants: Neurology PCCM Nephrology  Procedures: EEG Echocardiogram a bubble study Core track Tracheostomy Lumbar puncture in interventional radiology  Antibiotics: Acyclovir 11/1 through 11/4 Azithromycin 11/4 through 11/7 Ceftriaxone 11/4 through 11/7 Linezolid 11/4 through 11/5 Unasyn 11/8 through 11/11 Ceftriaxone 11/11 through 11/12 Zosyn 11/17 through 11/19 Cefazolin 11/19 through 11/21 Zosyn 11/22 through 11/28 Vancomycin 11/22 through 11/28   Time spent: 30 minutes    Erin Hearing ANP  [facility] 7 am - 330 pm/M-F for direct patient care and secure chat Please refer to Amion for contact info 37  days

## 2021-11-29 NOTE — Progress Notes (Signed)
Physical Therapy Wound Treatment Patient Details  Name: Jeffrey Mullen MRN: [id number] Date of Birth: June 26, 1951  Today's Date: 11/29/2021 Time: 1016-1053 Time Calculation (min): 37 min  Subjective  Subjective Assessment Subjective: Pt verbalize something about boots Patient and Family Stated Goals: pt unable to participate Date of Onset:  (unknown) Prior Treatments: dressing changes  Pain Score:  0/10  after premedication  Wound Assessment     Wound / Incision (Open or Dehisced) 10/27/21 Skin tear Arm Left;Lower;Posterior torn blisters with open skin (Active)  Wound Image   11/29/21 1212  Dressing Type Gauze (Comment);Impregnated gauze (bismuth);Moist to dry;Normal saline moist dressing;Santyl 11/29/21 1212  Dressing Changed Changed 11/29/21 1212  Dressing Status Clean;Dry;Intact 11/29/21 1212  Dressing Change Frequency Daily 11/29/21 1212  Site / Wound Assessment Bleeding;Pink;Red;Yellow 11/29/21 1212  % Wound base Red or Granulating 80% 11/29/21 1212  % Wound base Yellow/Fibrinous Exudate 20% 11/29/21 1212  % Wound base Black/Eschar 0% 11/29/21 1212  % Wound base Other/Granulation Tissue (Comment) 0% 11/29/21 1212  Peri-wound Assessment Intact 11/29/21 1212  Wound Length (cm) 15 cm 11/29/21 1212  Wound Width (cm) 8 cm 11/29/21 1212  Wound Depth (cm) 0 cm 11/29/21 1212  Wound Volume (cm^3) 0 cm^3 11/29/21 1212  Wound Surface Area (cm^2) 120 cm^2 11/29/21 1212  Margins Unattached edges (unapproximated) 11/29/21 1212  Closure None 11/26/21 1200  Drainage Amount Minimal 11/29/21 1212  Drainage Description Serous;Sanguineous 11/29/21 1212  Treatment Cleansed;Debridement (Selective);Hydrotherapy (Pulse lavage);Packing (Saline gauze) 11/29/21 1212      Hydrotherapy Pulsed lavage therapy - wound location: L dorsal forearm Pulsed Lavage with Suction (psi): 8 psi (4-12) Pulsed Lavage with Suction - Normal Saline Used: 1000 mL Pulsed Lavage Tip: Tip with splash  shield Selective Debridement Selective Debridement - Location: left dorsal forearm Selective Debridement - Tools Used: Forceps, Scissors Selective Debridement - Tissue Removed: brown/yellow nonviable tissue    Wound Assessment and Plan  Wound Therapy - Assess/Plan/Recommendations Wound Therapy - Clinical Statement: Pt making steady progress with removal of nonviable tissue of forearm wound. Wound Therapy - Functional Problem List: significant debilitation and low mobility Factors Delaying/Impairing Wound Healing: Altered sensation, Immobility, Substance abuse, Tobacco use Hydrotherapy Plan: Debridement, Dressing change, Patient/family education, Pulsatile lavage with suction Wound Therapy - Frequency: 3X / week Wound Therapy - Current Recommendations: WOC nurse Wound Therapy - Follow Up Recommendations: dressing changes by RN, Other (comment)  Wound Therapy Goals- Improve the function of patient's integumentary system by progressing the wound(s) through the phases of wound healing (inflammation - proliferation - remodeling) by: Wound Therapy Goals - Improve the function of patient's integumentary system by progressing the wound(s) through the phases of wound healing by: Decrease Necrotic Tissue to: 15 Decrease Necrotic Tissue - Progress: Progressing toward goal Increase Granulation Tissue to: 85 Increase Granulation Tissue - Progress: Progressing toward goal Goals/treatment plan/discharge plan were made with and agreed upon by patient/family: No, Patient unable to participate in goals/treatment/discharge plan and family unavailable Time For Goal Achievement: 7 days Wound Therapy - Potential for Goals: Good  Goals will be updated until maximal potential achieved or discharge criteria met.  Discharge criteria: when goals achieved, discharge from hospital, MD decision/surgical intervention, no progress towards goals, refusal/missing three consecutive treatments without notification or  medical reason.  GP     Charges PT Wound Care Charges $Wound Debridement up to 20 cm: < or equal to 20 cm $ Wound Debridement each add'l 20 sqcm: 1 $PT PLS Gun and Tip: 1 Supply $PT Hydrotherapy Visit: 1 Visit  Tessie Fass Ashwath Lasch 11/29/2021, 12:16 PM 11/29/2021  Ginger Carne., PT Acute Rehabilitation Services [phone]  (pager) [phone]  (office)

## 2021-11-30 DIAGNOSIS — Z930 Tracheostomy status: Secondary | ICD-10-CM | POA: Diagnosis not present

## 2021-11-30 DIAGNOSIS — T8089XD Other complications following infusion, transfusion and therapeutic injection, subsequent encounter: Secondary | ICD-10-CM | POA: Diagnosis not present

## 2021-11-30 DIAGNOSIS — I639 Cerebral infarction, unspecified: Secondary | ICD-10-CM | POA: Diagnosis not present

## 2021-11-30 DIAGNOSIS — G40901 Epilepsy, unspecified, not intractable, with status epilepticus: Secondary | ICD-10-CM | POA: Diagnosis not present

## 2021-11-30 LAB — GLUCOSE, CAPILLARY
Glucose-Capillary: 197 mg/dL — ABNORMAL HIGH (ref 70–99)
Glucose-Capillary: 77 mg/dL (ref 70–99)
Glucose-Capillary: 121 mg/dL — ABNORMAL HIGH (ref 70–99)
Glucose-Capillary: 167 mg/dL — ABNORMAL HIGH (ref 70–99)

## 2021-11-30 NOTE — Progress Notes (Addendum)
[facility] PROGRESS NOTE  Jeffrey Mullen IOX:[id number] DOB: 10-16-1951 DOA: 10/23/2021 PCP: [name], NP  Status: Remains inpatient appropriate because:  Unsafe discharge plan; currently not medically stable  Barriers to discharge: Social: Patient unable to independently make decisions regarding medical care-guardianship process pending  Clinical: Core track in place, trach tube in place, significantly impaired mobility secondary to right hemiparesis with therapies rec LTAC  Level of care:  Progressive   Code Status: DNR Family Communication: Has no family available to assist.  Girlfriend also unable to assist with patient after discharge. DVT prophylaxis: Subcutaneous heparin COVID vaccination status: 02/18/2020, 04/25/2020 and 11/11/2020  HPI: 70 y/o M with past medical history of hypertension, glaucoma who presented to [facility] ER on 10/31 via EMS with reports of altered mental status.  Given  right sided gaze, altered and left sided weakness, code stoke called. The patient was taken emergently to CT for imaging which was negative for LVO.  While in the [place], he had a witnessed seizure.  PCCM called for ICU admission. Chart review shows he was seen in the ER on 10/4 after passing out at the [employer].  Physical exam was negative at the time.  Labs negative.  No further imaging obtained.    Subjective: Alert.  Denies shortness of breath, chest discomfort or nausea.  Asked me to lower the head of his bed.  Objective: Vitals:   11/30/21 0302 11/30/21 0344  BP:  112/64  Pulse: 91 93  Resp: (!) 22 20  Temp:  97.6 F (36.4 C)  SpO2: 97% 98%    Intake/Output Summary (Last 24 hours) at 11/30/2021 0735 Last data filed at 11/30/2021 0344 Gross per 24 hour  Intake 240 ml  Output 2075 ml  Net -1835 ml   Filed Weights   11/16/21 0500 11/17/21 0408 11/18/21 0423  Weight: 82.8 kg 77.3 kg 73.7 kg    Exam:  Constitutional: NAD, calm Respiratory: 6.0 cuffed  trach 5 L, trach site unremarkable with some yellow-green secretions noted, no increased work of breathing and lungs otherwise clear and coarse to auscultation Cardiovascular: Regular rate, S1-S2.  No extremity edema.  Normotensive Abdomen: Soft and nontender with normoactive bowel sounds.  Cortrack tube.  Rectal tube in place with nonwatery brown stool Skin: Left arm wound covered with dressing which is clean dry and intact Neurologic: CN 2-12 grossly intact. Sensation intact, Strength 3-4/5 on left, marked decrease strength on right: Arm is 2/5 and leg is 3/5 Psychiatric:  alert and appropriately interactive.  Able to vocalize and follow simple commands-oriented to name and place  Assessment/Plan: Acute problems: Acute multifocal bilateral embolic stroke TTE: No embolic source was identified.  TEE was attempted but could not be performed successfully. LDL 66.  HbA1c 6.0.  Continue aspirin and statin Noted to be quite deconditioned.  PT and OT.   Status epilepticus Resolved.   Continue Keppra and lacosamide.     Acute metabolic encephalopathy Encephalopathy is likely secondary to all of his acute issues including epilepsy and stroke and acute respiratory failure.   Sepsis likely secondary to tracheitis Trach cultures were positive for MSSA and completed all antibiotics by 11/28 Repeat sputum culture on 12 /1 with few MSSA resistant to inducible clindamycin, erythromycin and clindamycin-Since fevers are low-grade Bactrim initiated  Tracheostomy dependent Has cuffed trach in place and stable on room air Trach team following-tolerating longer periods of PMSV Currently secretions are too thick and cough effort to weak to be considered for decannulation  Dysphagia Still requiring Cortrak and on D1 diet but slow progress-oral intake variable but over the past several days has been between 50 and 75% Reevaluated by speech therapy on 12/8 and was able to tolerate PMSV entire session.   Unfortunately still unable to advance to D2 diet May end up requiring PEG if no significant improvements in swallowing by next week   Left cephalic vein superficial thrombophlebitis/left arm extravasation injury Status post phentolamine and nitroglycerin application.   Continue local wound /hydrotherapy noting significant improvement in appearance of the wound as of 12/7      10/23/2021                  11/20/2021   11/29/2021  Hypernatremia/high volume stool output 2/2 free H20 deficit   As of 12/3 sodium had improved to 144-repeat in a.m. 12/9 Continue free water per tube as well as fiber supplement/Imodium for diarrhea  Physical deconditioning Finally aware of how debilitated he is Rec is for LTAC vs trach capable SNF   Essential hypertension/grade 2 diastolic dysfunction Continue amlodipine, carvedilol Blood pressure controlled patient remains  Anemia of critical illness Hemoglobin 10.7 on 11/28   Hypokalemia Resolved with potassium 4.2 as of 37/62   Alcoholic liver cirrhosis PCP notes document history of alcohol abuse and known cirrhosis LFTs stable.  RUQ ultrasound c/w cirrhosis.  Unremarkable appearance of gallbladder and biliary tree was noted. Ammonia level 44 in context of slightly elevated BUN-given current high-volume diarrhea not a candidate for lactulose at this juncture  Acute kidney injury due to contrast-induced ATN Resolved.   Current creatinine 0.86  Nontraumatic rhabdomyolysis Resolved   Goals of care Palliative medicine has been following.  They had been communicating with patient's former significant other.  Apparently this person requested that she no longer wanted to be the surrogate decision maker.  Previously his biological daughter has also mentioned that she does not want to be the primary decision maker.  Social worker to help conduct search for alternative family members/next of kin. After discussions with critical care medicine and with  two-physician documentation patient's CODE STATUS was changed to DNR.  See note from 11/27.    Data Reviewed: Basic Metabolic Panel: Recent Labs  Lab 11/24/21 0317 11/25/21 0420  NA 146* 144  K 4.0 4.0  CL 116* 111  CO2 24 23  GLUCOSE 126* 169*  BUN 29* 31*  CREATININE 0.84 0.86  CALCIUM 9.5 9.4   Liver Function Tests: Recent Labs  Lab 11/25/21 0420  AST 78*  ALT 137*  ALKPHOS 262*  BILITOT 0.5  PROT 8.0  ALBUMIN 2.5*    CBC: Recent Labs  Lab 11/25/21 0420 11/27/21 0831  WBC 8.4 9.4  NEUTROABS 5.3 5.0  HGB 10.3* 10.8*  HCT 32.4* 33.2*  MCV 89.5 88.3  PLT 322 242    CBG: Recent Labs  Lab 11/29/21 1516 11/29/21 2039 11/29/21 2351 11/30/21 0342 11/30/21 0731  GLUCAP 129* 183* 138* 197* 77    Recent Results (from the past 240 hour(s))  Culture, Respiratory w Gram Stain     Status: None   Collection Time: 11/23/21 11:50 AM   Specimen: Tracheal Aspirate; Respiratory  Result Value Ref Range Status   Specimen Description TRACHEAL ASPIRATE  Final   Special Requests NONE  Final   Gram Stain   Final    WBC PRESENT,BOTH PMN AND MONONUCLEAR RARE GRAM POSITIVE COCCI Performed at [facility], [place] [address], [place], [place] [address]    Culture  FEW STAPHYLOCOCCUS AUREUS  Final   Report Status 11/26/2021 FINAL  Final   Organism ID, Bacteria STAPHYLOCOCCUS AUREUS  Final      Susceptibility   Staphylococcus aureus - MIC*    CIPROFLOXACIN <=0.5 SENSITIVE Sensitive     ERYTHROMYCIN >=8 RESISTANT Resistant     GENTAMICIN <=0.5 SENSITIVE Sensitive     OXACILLIN 0.5 SENSITIVE Sensitive     TETRACYCLINE <=1 SENSITIVE Sensitive     VANCOMYCIN 1 SENSITIVE Sensitive     TRIMETH/SULFA <=10 SENSITIVE Sensitive     CLINDAMYCIN RESISTANT Resistant     RIFAMPIN <=0.5 SENSITIVE Sensitive     Inducible Clindamycin POSITIVE Resistant     * FEW STAPHYLOCOCCUS AUREUS  Culture, blood (Routine X 2) w Reflex to ID Panel     Status: None (Preliminary result)    Collection Time: 11/26/21 10:10 AM   Specimen: BLOOD RIGHT HAND  Result Value Ref Range Status   Specimen Description BLOOD RIGHT HAND  Final   Special Requests   Final    BOTTLES DRAWN AEROBIC AND ANAEROBIC Blood Culture adequate volume   Culture   Final    NO GROWTH 3 DAYS Performed at [facility], [address], [place], [place] [address]    Report Status PENDING  Incomplete  Culture, blood (Routine X 2) w Reflex to ID Panel     Status: None (Preliminary result)   Collection Time: 11/26/21 10:25 AM   Specimen: BLOOD RIGHT HAND  Result Value Ref Range Status   Specimen Description BLOOD RIGHT HAND  Final   Special Requests   Final    BOTTLES DRAWN AEROBIC AND ANAEROBIC Blood Culture adequate volume   Culture   Final    NO GROWTH 3 DAYS Performed at [facility], [place] [address] [place], [place] [address]    Report Status PENDING  Incomplete      Scheduled Meds:  amLODipine  10 mg Per Tube Daily   aspirin  81 mg Per Tube Daily   atorvastatin  40 mg Per Tube Daily   carvedilol  12.5 mg Per Tube BID WC   chlorhexidine  15 mL Mouth Rinse BID   collagenase   Topical BID   dorzolamide-timolol  1 drop Right Eye BID   feeding supplement (JEVITY 1.5 CAL/FIBER)  900 mL Per Tube Q24H   feeding supplement (PROSource TF)  45 mL Per Tube QID   fiber  1 packet Per Tube BID   folic acid  1 mg Per Tube Daily   free water  300 mL Per Tube Q4H   heparin  5,000 Units Subcutaneous Q8H   insulin aspart  0-15 Units Subcutaneous Q4H   lacosamide  200 mg Per Tube BID   latanoprost  1 drop Right Eye QHS   levETIRAcetam  500 mg Per Tube BID   loperamide HCl  1 mg Per Tube Daily   mouth rinse  15 mL Mouth Rinse q12n4p   multivitamin with minerals  1 tablet Per Tube Daily   nicotine  14 mg Transdermal Daily   nutrition supplement (JUVEN)  1 packet Per Tube BID BM   pantoprazole sodium  40 mg Per Tube QHS   saccharomyces boulardii  250 mg Per Tube BID   scopolamine  1 patch  Transdermal Q72H   sodium chloride flush  10-40 mL Intracatheter Q12H   sulfamethoxazole-trimethoprim  20 mL Oral Q12H   thiamine  100 mg Per Tube Daily   Continuous Infusions:  sodium  chloride Stopped (11/07/21 0016)   sodium chloride 5 mL/hr at 11/19/21 5909    Active Problems:   Status epilepticus ([place])   Acute respiratory failure with hypercapnia (HCC)   Acute encephalopathy   Cryptogenic stroke (HCC)   Pressure injury of skin   AKI (acute kidney injury) ([place])   Seizure ([place])   Acute respiratory failure with hypoxia (HCC)   Status post tracheostomy ([place])   Hypokalemia   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 ([place])   Oropharyngeal dysphagia   Consultants: Neurology PCCM Nephrology  Procedures: EEG Echocardiogram a bubble study Core track Tracheostomy Lumbar puncture in interventional radiology  Antibiotics: Acyclovir 11/1 through 11/4 Azithromycin 11/4 through 11/7 Ceftriaxone 11/4 through 11/7 Linezolid 11/4 through 11/5 Unasyn 11/8 through 11/11 Ceftriaxone 11/11 through 11/12 Zosyn 11/17 through 11/19 Cefazolin 11/19 through 11/21 Zosyn 11/22 through 11/28 Vancomycin 11/22 through 11/28   Time spent: 30 minutes    Erin Hearing ANP  [facility] 7 am - 330 pm/M-F for direct patient care and secure chat Please refer to Amion for contact info 38  days

## 2021-11-30 NOTE — Progress Notes (Signed)
Nocturnal tube feed stopped at 0600 per MD order. Flushed w/300 ml prior to stoppage. Pt tolerated well.

## 2021-11-30 NOTE — NC FL2 (Signed)
[place] LEVEL OF CARE SCREENING TOOL     IDENTIFICATION  Patient Name: Jeffrey Mullen Birthdate: September 12, 1951 Sex: male Admission Date (Current Location): 10/23/2021  [place] and [place] Number:  Kathleen Argue [id number] [place] and Address:  [facility] [place], [place] [address], [place], [place] [address]      Provider Number: [id number]  Attending Physician Name and Address:  Caren Griffins, MD  Relative Name and Phone Number:       Current Level of Care: Hospital Recommended Level of Care: [place] Prior Approval Number:    Date Approved/Denied:   PASRR Number: [id number]  Discharge Plan: SNF    Current Diagnoses: Patient Active Problem List   Diagnosis Date Noted   Oropharyngeal dysphagia    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 ([place])    Acute respiratory failure with hypoxia [facility])    Status post tracheostomy ([place])    Hypokalemia    Pressure injury of skin 11/08/2021   AKI (acute kidney injury) ([place])    Seizure ([place])    Cryptogenic stroke ([place])    Acute respiratory failure with hypercapnia ([place])    Acute encephalopathy    Status epilepticus ([place]) 10/23/2021   Cataract Right eye 12/12/2018   Glaucoma, congenital, blind Left eye  12/12/2018   Lives in homeless shelter 12/12/2018   Shoulder pain, right 12/12/2018   Tobacco abuse    ETOH abuse    Benign essential HTN    Hepatic cirrhosis ([employer]) 02/10/2015   Chronic hepatitis C without hepatic coma ([place]) 01/05/2015    Orientation RESPIRATION BLADDER Height & Weight     Self, Place  Other (Comment), Tracheostomy (Trach collar on 21% room air humidified) External catheter Weight: 73.7 kg Height:  5\' 10"  (177.8 cm)  BEHAVIORAL SYMPTOMS/MOOD NEUROLOGICAL BOWEL NUTRITION STATUS    Convulsions/Seizures (Last seizure documented 10/26/21) Incontinent Feeding tube (Temporary Cortrak present -)  AMBULATORY STATUS COMMUNICATION OF NEEDS Skin   Total Care Verbally  (using PMV -) Hydro Therapy                       Personal Care Assistance Level of Assistance  Bathing, Feeding, Dressing Bathing Assistance: Maximum assistance Feeding assistance: Maximum assistance (Cortrak present - possible G-tube placement if bed offer determined for placement) Dressing Assistance: Maximum assistance     Functional Limitations Info  Sight, Hearing, Speech Sight Info: Impaired (Blind in left eye) Hearing Info: Adequate Speech Info: Impaired (trach present since 11/03/2021 - PMV use, trach collar)    SPECIAL CARE FACTORS FREQUENCY  PT (By licensed PT), OT (By licensed OT), Speech therapy     PT Frequency: 3-5 x per week OT Frequency: 3-5 times per week     Speech Therapy Frequency: 1-2 x per week      Contractures Contractures Info: Not present    Additional Factors Info  Code Status, Insulin Sliding Scale, Allergies Code Status Info: DNR Allergies Info: NKDA   Insulin Sliding Scale Info: See discharge summary       Current Medications (11/30/2021):  This is the current hospital active medication list Current Facility-Administered Medications  Medication Dose Route Frequency Provider Last Rate Last Admin   0.9 %  sodium chloride infusion   Intravenous PRN Candee Furbish, MD   Stopped at 11/07/21 0016   0.9 %  sodium chloride infusion  250 mL Intravenous Continuous Frederik Pear, MD 5 mL/hr at 11/19/21 0949 Restarted at 11/19/21 0949   acetaminophen (  TYLENOL) 160 MG/5ML solution 650 mg  650 mg Per Tube Q8H PRN Jacky Kindle, MD   650 mg at 11/29/21 0945   amLODipine (NORVASC) tablet 10 mg  10 mg Per Tube Daily Mick Sell, PA-C   10 mg at 11/30/21 3790   aspirin chewable tablet 81 mg  81 mg Per Tube Daily Lora Havens, MD   81 mg at 11/30/21 0834   atorvastatin (LIPITOR) tablet 40 mg  40 mg Per Tube Daily Noemi Chapel P, DO   40 mg at 11/30/21 0834   carvedilol (COREG) tablet 12.5 mg  12.5 mg Per Tube BID WC Andres Labrum D, PA-C   12.5  mg at 11/30/21 0834   chlorhexidine (PERIDEX) 0.12 % solution 15 mL  15 mL Mouth Rinse BID Jacky Kindle, MD   15 mL at 11/30/21 2409   collagenase (SANTYL) ointment   Topical BID Julian Hy, DO   Given at 11/30/21 0835   docusate (COLACE) 50 MG/5ML liquid 100 mg  100 mg Per Tube BID PRN Candee Furbish, MD   100 mg at 11/26/21 2151   dorzolamide-timolol (COSOPT) 22.3-6.8 MG/ML ophthalmic solution 1 drop  1 drop Right Eye BID Bonnielee Haff, MD   1 drop at 11/30/21 0835   feeding supplement (JEVITY 1.5 CAL/FIBER) liquid 900 mL  900 mL Per Tube Q24H Charlynne Cousins, MD   900 mL at 11/29/21 1750   feeding supplement (PROSource TF) liquid 45 mL  45 mL Per Tube QID Charlynne Cousins, MD   45 mL at 11/30/21 7353   fentaNYL (SUBLIMAZE) injection 25 mcg  25 mcg Intravenous Q4H PRN Jacky Kindle, MD   25 mcg at 11/29/21 1011   fiber (NUTRISOURCE FIBER) 1 packet  1 packet Per Tube BID Samella Parr, NP   1 packet at 29/92/42 6834   folic acid (FOLVITE) tablet 1 mg  1 mg Per Tube Daily Jacky Kindle, MD   1 mg at 11/30/21 0834   free water 300 mL  300 mL Per Tube Q4H Bonnielee Haff, MD   300 mL at 11/30/21 0834   guaiFENesin (ROBITUSSIN) 100 MG/5ML liquid 10 mL  10 mL Per Tube Q4H PRN Magdalen Spatz, NP   10 mL at 11/26/21 2151   heparin injection 5,000 Units  5,000 Units Subcutaneous Q8H Freda Jackson B, MD   5,000 Units at 11/30/21 0551   hydrALAZINE (APRESOLINE) injection 5-10 mg  5-10 mg Intravenous Q4H PRN Ogan, Kerry Kass, MD       insulin aspart (novoLOG) injection 0-15 Units  0-15 Units Subcutaneous Q4H Jennelle Human B, NP   3 Units at 11/30/21 0352   ipratropium-albuterol (DUONEB) 0.5-2.5 (3) MG/3ML nebulizer solution 3 mL  3 mL Nebulization Q4H PRN Jacky Kindle, MD       labetalol (NORMODYNE) injection 10-20 mg  10-20 mg Intravenous Q2H PRN Jacky Kindle, MD   20 mg at 11/16/21 0747   lacosamide (VIMPAT) tablet 200 mg  200 mg Per Tube BID Christian, Rylee, MD   200 mg at  11/30/21 0834   latanoprost (XALATAN) 0.005 % ophthalmic solution 1 drop  1 drop Right Eye QHS Bonnielee Haff, MD   1 drop at 11/29/21 2047   levETIRAcetam (KEPPRA) 100 MG/ML solution 500 mg  500 mg Per Tube BID Darrick Meigs, Rylee, MD   500 mg at 11/30/21 1962   loperamide HCl (IMODIUM) 1 MG/7.5ML suspension 1 mg  1 mg Per Tube Daily Samella Parr, NP  1 mg at 11/30/21 1607   loperamide HCl (IMODIUM) 1 MG/7.5ML suspension 2 mg  2 mg Per Tube PRN Jacky Kindle, MD   2 mg at 11/18/21 1018   MEDLINE mouth rinse  15 mL Mouth Rinse q12n4p Jacky Kindle, MD   15 mL at 11/29/21 1600   multivitamin with minerals tablet 1 tablet  1 tablet Per Tube Daily Freddi Starr, MD   1 tablet at 11/30/21 0834   nicotine (NICODERM CQ - dosed in mg/24 hours) patch 14 mg  14 mg Transdermal Daily Bonnielee Haff, MD   14 mg at 11/30/21 0831   nutrition supplement (JUVEN) (JUVEN) powder packet 1 packet  1 packet Per Tube BID BM Collene Gobble, MD   1 packet at 11/30/21 0821   pantoprazole sodium (PROTONIX) 40 mg/20 mL oral suspension 40 mg  40 mg Per Tube QHS Collene Gobble, MD   40 mg at 11/29/21 2045   polyethylene glycol (MIRALAX / GLYCOLAX) packet 17 g  17 g Per Tube Daily PRN Candee Furbish, MD   17 g at 10/27/21 1547   saccharomyces boulardii (FLORASTOR) capsule 250 mg  250 mg Per Tube BID Samella Parr, NP   250 mg at 11/30/21 0834   scopolamine (TRANSDERM-SCOP) 1 MG/3DAYS 1.5 mg  1 patch Transdermal Q72H Chand, Currie Paris, MD   1.5 mg at 11/29/21 0953   sodium chloride flush (NS) 0.9 % injection 10-40 mL  10-40 mL Intracatheter Q12H Chand, Currie Paris, MD   10 mL at 11/29/21 2047   sodium chloride flush (NS) 0.9 % injection 10-40 mL  10-40 mL Intracatheter PRN Jacky Kindle, MD       sulfamethoxazole-trimethoprim (BACTRIM) 200-40 MG/5ML suspension 20 mL  20 mL Oral Q12H Samella Parr, NP   20 mL at 11/29/21 2050   thiamine tablet 100 mg  100 mg Per Tube Daily Margaretha Seeds, MD   100 mg at 11/30/21 3710      Discharge Medications: Please see discharge summary for a list of discharge medications.  Relevant Imaging Results:  Relevant Lab Results:   Additional Information SSN: [id number], COVID vaccines 02/18/2020, 04/25/2020, 11/11/2020  Curlene Labrum, RN

## 2021-11-30 NOTE — Progress Notes (Signed)
Physical Therapy Treatment Patient Details Name: Jeffrey Mullen MRN: [id number] DOB: 04/21/1951 Today's Date: 11/30/2021   History of Present Illness 70 y/o male presented to ED on 10/23/21 after bystanders witnessed patient collapse with associated seizure. Another seizure witnessed in ED. CT head negative. CT C-spine negative. LTM EEG on 11/1 showed 2 seizures from R anterior temporal region. Last seizure noted on EEG on 11/3. Intubated 10/31-11/11. Trach placed 11/11. 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 RN called PTA back in to room to assist due to pt deconditioning and complexity of transfer with lines/leads. Pt making good progress toward goals, able to stand with modA +2 for return transfer with [facility] and able to roll with min/modA, less assist than previous session. Pt continues to benefit from PT services to progress toward functional mobility goals. Continue to recommend LTACH however if pt does not qualify, he will likely benefit from PT at skilled nursing facility.  Recommendations for follow up therapy are one component of a multi-disciplinary discharge planning process, led by the attending physician.  Recommendations may be updated based on patient status, additional functional criteria and insurance authorization.  Follow Up Recommendations  PT at Long-term acute care hospital (long term SNF if he cannot get LTACH placement)     Assistance Recommended at Discharge Frequent or constant Supervision/Assistance  Equipment Recommendations  Other (comment) (TBD)    Recommendations for  Other Services       Precautions / Restrictions Precautions Precautions: Fall Precaution Comments: trach, cortrak, rectal tube, condom cath Restrictions Weight Bearing Restrictions: No     Mobility  Bed Mobility Overal bed mobility: Needs Assistance Bed Mobility: Rolling;Sit to Sidelying Rolling: Mod assist;Min assist Sidelying to sit: Max assist;+2 for physical assistance;+2 for safety/equipment     Sit to sidelying: +2 for physical assistance;Max assist General bed mobility comments: hand over hand assist for UE placement and maxA for BLE lifting with reverse log roll to supine. Pt able to initiate roll from sidelying to supine and also from supine to R with cues/increased time.    Transfers Overall transfer level: Needs assistance Equipment used: Ambulation equipment used Transfers: Sit to/from Stand;Bed to chair/wheelchair/BSC Sit to Stand: +2 physical assistance;Mod assist           General transfer comment: posterior leaning while standing in stedy, required assistance with trunk and to maintain LUE on handle. Pt states "7/10" when asked if he was dizzy while standing, unclear if he misunderstood question or not. Able to stand from reclinder to [facility] then from [place] to EOB with +2 modA Transfer via Lift Equipment: Stedy  Ambulation/Gait             Pre-gait activities: pt too fatigued to attempt after sitting in chair       Modified Rankin (Stroke Patients Only) Modified Rankin (Stroke Patients Only) Pre-Morbid Rankin Score: No symptoms Modified Rankin: Severe disability     Balance Overall balance assessment: Needs assistance Sitting-balance support: Feet supported;Bilateral upper extremity supported Sitting balance-Leahy Scale: Poor Sitting balance - Comments: mod assist for sitting balance due to left lateral lean at EOB and in [place]  Postural control: Left lateral lean;Posterior lean Standing balance support: Bilateral upper extremity  supported Standing balance-Leahy Scale: Poor Standing balance comment: reliant on Stedy for support with balance in stance, able to sit with minA at EOB with 1-2 UE support but needs external assist while seated          Cognition Arousal/Alertness: Awake/alert Behavior During Therapy: Flat affect Overall Cognitive Status: No family/caregiver present to determine baseline cognitive functioning Area of Impairment: Attention;Memory;Awareness;Safety/judgement;Following commands;Problem solving       Orientation Level: Disoriented to;Time;Situation Current Attention Level: Sustained Memory: Decreased short-term memory;Decreased recall of precautions Following Commands: Follows one step commands consistently;Follows one step commands with increased time Safety/Judgement: Decreased awareness of safety;Decreased awareness of deficits Awareness: Intellectual Problem Solving: Slow processing;Difficulty sequencing;Requires verbal cues;Requires tactile cues General Comments: responded to commands correctly, asked to repeat commands due to [facility]. He did well with PMSV during PT session, increased secretions once back in supine so PTA removed valve/rinsed in cup and air dried and RN present to suction outer secretions.        Exercises General Exercises - Lower Extremity Ankle Circles/Pumps: AROM;Both;10 reps;Supine Other Exercises Other Exercises: STS x2 trials    General Comments General comments (skin integrity, edema, etc.): 7/10 RPE (fatigue) reported at end of session      Pertinent Vitals/Pain Pain Assessment: Faces Faces Pain Scale: Hurts whole lot Pain Location: neck pain Pain Descriptors / Indicators: Discomfort;Grimacing Pain Intervention(s): Monitored during session;Repositioned;Patient requesting pain meds-RN notified     PT Goals (current goals can now be found in the care plan section) Acute Rehab PT Goals Patient Stated Goal: unable to state PT Goal Formulation: With  patient Time For Goal Achievement: 12/04/21 Progress towards PT goals: Progressing toward goals    Frequency    Min 3X/week      PT Plan Current plan remains appropriate    Co-evaluation PT/OT/SLP Co-Evaluation/Treatment: Yes Reason for Co-Treatment: Complexity of the patient's impairments (multi-system involvement);Necessary to address cognition/behavior during functional activity;For patient/therapist safety;To address functional/ADL transfers PT goals addressed during session: Mobility/safety with mobility;Balance;Proper use of DME;Strengthening/ROM OT goals addressed during session: Other (comment) (EOB balance and transfers)      AM-PAC PT "6 Clicks" Mobility   Outcome Measure  Help needed turning from your back to your side while in a flat bed without using bedrails?: A Lot Help needed moving from lying on your back to sitting on the side of a flat bed without using bedrails?: Total Help needed moving to and from a bed to a chair (including a wheelchair)?: Total Help needed standing up from a chair using your arms (e.g., wheelchair or bedside chair)?: A Lot Help needed to walk in hospital room?: Total Help needed climbing 3-5 steps with a railing? : Total 6 Click Score: 8    End of Session Equipment Utilized During Treatment: Gait belt;Other (comment) (trach collar on humidified air) Activity Tolerance: Patient tolerated treatment well Patient left: with call bell/phone within reach;in bed (heels floated) Nurse Communication: Mobility status;Need for lift equipment;Other (comment);Patient requests pain meds (he requests water (nectar thick, needs full Supervision)) PT Visit Diagnosis: Other abnormalities of gait and mobility (R26.89);Other symptoms and signs involving the nervous system (R29.898);Muscle weakness (generalized) (M62.81)     Time: 0867-6195 PT Time Calculation (min) (ACUTE ONLY): 22 min  Charges:  $Therapeutic Activity: 8-22 mins                      Raima Geathers P., PTA Acute Rehabilitation Services  Pager: [phone] Office: [place] 11/30/2021, 3:07 PM

## 2021-11-30 NOTE — Progress Notes (Signed)
Nutrition Follow-up  DOCUMENTATION CODES:  Not applicable  INTERVENTION:  -Initiate calorie count -Continue nocturnal TF via Cortrak:  -Jevity 1.5 @ 32ml/hr x 12 hours (948ml), to be administered from 1800-0600  -14ml Prosource TF QID  -Free water flush per MD/NP, currently 339ml Q4H  Provides 1510 kcals, 101 grams protein, 650ml free water (2459ml total free water)  Meets ~72% and ~85% of pt's minimum estimated calorie and protein needs, respectively   -Continue Juven BID per tube for wound healing -Continue MVI with minerals daily -Continue Vital Cuisine Shake po BID, each supplement provides 520 kcal and 22 grams of protein  NUTRITION DIAGNOSIS:  Inadequate oral intake related to inability to eat as evidenced by NPO status. -- Progressing, pt on dysphagia 1 diet with nectar thick liquids  GOAL:  Patient will meet greater than or equal to 90% of their needs -- Addressing with TF  MONITOR:  PO intake, Supplement acceptance, Diet advancement, Skin, TF tolerance, Weight trends, Labs, I & O's   REASON FOR ASSESSMENT:  Ventilator    ASSESSMENT:  Pt with PMH of hepatitis C, alcohol abuse and seizures admitted with with status epilepticus.  11/11 cortrak placed 11/15 cortrak removed 11/16 cortrak replaced (gastric tip per xray)  Pt slowly improving and likely needing SNF at discharge per NP, though pt still has Cortrak in place which will be a barrier to placement. Cortrak remains in place for nocturnal TF as pt's PO intake had not been adequate to meet his nutritional needs, though intake appears to be somewhat improving. RD to initiate calorie count to monitor adequacy of PO intake and to determine if pt still needs Cortrak or if it can be removed.   PO intake: 0-75% x last 8 recorded meals (25% avg meal intake)  Current TF: Jevity 1.5 @ 45ml/hr x 12 hours (961ml) w/ 1ml Prosource TF QID and free water flush 325ml Q4H  UOP: 1790ml x24 hours Stool output: 325ml x24  hours I/O: -5520ml since admit  Medications: Scheduled Meds:  amLODipine  10 mg Per Tube Daily   aspirin  81 mg Per Tube Daily   atorvastatin  40 mg Per Tube Daily   carvedilol  12.5 mg Per Tube BID WC   chlorhexidine  15 mL Mouth Rinse BID   collagenase   Topical BID   dorzolamide-timolol  1 drop Right Eye BID   feeding supplement (JEVITY 1.5 CAL/FIBER)  900 mL Per Tube Q24H   feeding supplement (PROSource TF)  45 mL Per Tube QID   fiber  1 packet Per Tube BID   folic acid  1 mg Per Tube Daily   free water  300 mL Per Tube Q4H   heparin  5,000 Units Subcutaneous Q8H   insulin aspart  0-15 Units Subcutaneous Q4H   lacosamide  200 mg Per Tube BID   latanoprost  1 drop Right Eye QHS   levETIRAcetam  500 mg Per Tube BID   loperamide HCl  1 mg Per Tube Daily   mouth rinse  15 mL Mouth Rinse q12n4p   multivitamin with minerals  1 tablet Per Tube Daily   nicotine  14 mg Transdermal Daily   nutrition supplement (JUVEN)  1 packet Per Tube BID BM   pantoprazole sodium  40 mg Per Tube QHS   saccharomyces boulardii  250 mg Per Tube BID   scopolamine  1 patch Transdermal Q72H   sodium chloride flush  10-40 mL Intracatheter Q12H   sulfamethoxazole-trimethoprim  20 mL Oral  Q12H   thiamine  100 mg Per Tube Daily  Continuous Infusions:  sodium chloride Stopped (11/07/21 0016)   sodium chloride 5 mL/hr at 11/19/21 0949   Labs: Recent Labs  Lab 11/24/21 0317 11/25/21 0420  NA 146* 144  K 4.0 4.0  CL 116* 111  CO2 24 23  BUN 29* 31*  CREATININE 0.84 0.86  CALCIUM 9.5 9.4  GLUCOSE 126* 169*  CBGs: 77-197 x24 hours  Diet Order:   Diet Order             DIET - DYS 1 Room service appropriate? No; Fluid consistency: Nectar Thick  Diet effective now                  EDUCATION NEEDS:  Not appropriate for education at this time  Skin:  Skin Assessment: Skin Integrity Issues: Skin Integrity Issues:: Stage II, DTI, Unstageable, Other (Comment) DTI: L heel Stage II: R heel, L  elbow, anus Unstageable: L/R buttocks Other: skin tears to penis, back, arm  Last BM:  12/7 via rectal tube/pouch  Height:  Ht Readings from Last 1 Encounters:  11/02/21 5\' 10"  (1.778 m)   Weight:  Wt Readings from Last 1 Encounters:  11/18/21 73.7 kg   Ideal Body Weight:  75.5 kg  BMI:  Body mass index is 23.31 kg/m.  Estimated Nutritional Needs:  Kcal:  2100-2300 Protein:  120-140 grams Fluid:  > 2 /day   Theone Stanley., MS, RD, LDN (she/her/hers) RD pager number and weekend/on-call pager number located in [place].

## 2021-11-30 NOTE — Progress Notes (Signed)
Occupational Therapy Treatment Patient Details Name: Jeffrey Mullen MRN: [id number] DOB: 1951-11-13 Today's Date: 11/30/2021   History of present illness 70 y/o male presented to ED on 10/23/21 after bystanders witnessed patient collapse with associated seizure. Another seizure witnessed in ED. CT head negative. CT C-spine negative. LTM EEG on 11/1 showed 2 seizures from R anterior temporal region. Last seizure noted on EEG on 11/3. Intubated 10/31-11/11. Trach placed 11/11. 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 Patient received in bed and agreeable to PT/OT treatment. Patient required max assist  +2 to get to EOB and mod assist for sitting balance due to left lateral lean.  Patient was instructed on Stedy use and stood with max assist +2 and assistance with balance while in stedy.  Patient was transferred to recliner and positioned in chair to address left lateral lean and comfort. Patient making gains with transfers and Acute OT to continue to follow.    Recommendations for follow up therapy are one component of a multi-disciplinary discharge planning process, led by the attending physician.  Recommendations may be updated based on patient status, additional functional criteria and insurance authorization.    Follow Up Recommendations  Skilled nursing-short term rehab (<3 hours/day)    Assistance Recommended at Discharge Frequent or constant Supervision/Assistance  Equipment Recommendations  Wheelchair (measurements OT);Wheelchair cushion (measurements OT);Hospital bed;Other (comment)    Recommendations for Other Services      Precautions / Restrictions Precautions Precautions:  Fall Precaution Comments: trach, cortrak, rectal tube, condom cath Restrictions Weight Bearing Restrictions: No       Mobility Bed Mobility Overal bed mobility: Needs Assistance Bed Mobility: Rolling;Sidelying to Sit Rolling: Max assist;Mod assist Sidelying to sit: Max assist;+2 for physical assistance;+2 for safety/equipment       General bed mobility comments: able to move BLEs towards EOB but was unable to complete. Required assistance for trunk    Transfers Overall transfer level: Needs assistance Equipment used: Ambulation equipment used Transfers: Sit to/from Stand;Bed to chair/wheelchair/BSC Sit to Stand: +2 physical assistance;Max assist           General transfer comment: posterior leaning while standing in stedy, required assistance with trunk Transfer via Lift Equipment: Stedy   Balance Overall balance assessment: Needs assistance Sitting-balance support: Feet supported;Bilateral upper extremity supported Sitting balance-Leahy Scale: Poor Sitting balance - Comments: mod assist for sitting balance due to left lateral lean Postural control: Left lateral lean;Posterior lean Standing balance support: Bilateral upper extremity supported Standing balance-Leahy Scale: Poor Standing balance comment: reliant on Stedy for support with balance                           ADL either performed or assessed with clinical judgement   ADL                                              Extremity/Trunk Assessment Upper Extremity Assessment RUE Deficits / Details: actively helping L UE RUE Sensation: decreased light touch;decreased proprioception RUE Coordination: decreased fine motor;decreased gross motor LUE Sensation: decreased light touch LUE Coordination: decreased fine motor;decreased  gross motor            Vision       Perception     Praxis      Cognition Arousal/Alertness: Awake/alert Behavior During Therapy: Flat  affect Overall Cognitive Status: No family/caregiver present to determine baseline cognitive functioning Area of Impairment: Attention;Memory;Awareness;Safety/judgement;Following commands;Problem solving                 Orientation Level: Disoriented to;Time;Situation Current Attention Level: Sustained Memory: Decreased short-term memory;Decreased recall of precautions Following Commands: Follows one step commands consistently;Follows one step commands with increased time Safety/Judgement: Decreased awareness of safety;Decreased awareness of deficits Awareness: Intellectual Problem Solving: Slow processing;Difficulty sequencing;Requires verbal cues;Requires tactile cues General Comments: responded to commands correctly, asked to repeat commands due to [facility]          Exercises     Shoulder Instructions       General Comments      Pertinent Vitals/ Pain       Pain Assessment: Faces Pain Score: 7  Faces Pain Scale: Hurts little more Pain Location: neck pain Pain Descriptors / Indicators: Discomfort;Grimacing Pain Intervention(s): Monitored during session;Repositioned  Home Living                                          Prior Functioning/Environment              Frequency  Min 2X/week        Progress Toward Goals  OT Goals(current goals can now be found in the care plan section)  Progress towards OT goals: Progressing toward goals  Acute Rehab OT Goals OT Goal Formulation: Patient unable to participate in goal setting Time For Goal Achievement: 12/12/21 Potential to Achieve Goals: Fair ADL Goals Pt Will Perform Grooming: with min assist;sitting Pt Will Perform Upper Body Bathing: with min assist;sitting Additional ADL Goal #1: Pt will follow 3 step command 25% of session attempts Additional ADL Goal #2: Pt will initate ADL task on L side 50% of attempts Additional ADL Goal #3: Pt will tolerate EOB static sitting min (A) as precursor to  adls  Plan Discharge plan remains appropriate;Frequency remains appropriate    Co-evaluation    PT/OT/SLP Co-Evaluation/Treatment: Yes Reason for Co-Treatment: Complexity of the patient's impairments (multi-system involvement);For patient/therapist safety;To address functional/ADL transfers   OT goals addressed during session: Other (comment) (EOB balance and transfers)      AM-PAC OT "6 Clicks" Daily Activity     Outcome Measure   Help from another person eating meals?: A Lot Help from another person taking care of personal grooming?: A Little Help from another person toileting, which includes using toliet, bedpan, or urinal?: A Lot Help from another person bathing (including washing, rinsing, drying)?: A Lot Help from another person to put on and taking off regular upper body clothing?: A Lot Help from another person to put on and taking off regular lower body clothing?: A Lot 6 Click Score: 13    End of Session Equipment Utilized During Treatment: Gait belt;Oxygen;Other (comment) Charlaine Dalton)  OT Visit Diagnosis: Unsteadiness on feet (R26.81);Muscle weakness (generalized) (M62.81)   Activity Tolerance Patient tolerated treatment well   Patient Left in chair;with call bell/phone within reach;with chair alarm set   Nurse Communication Mobility status;Need for lift equipment        Time: 1137-1206 OT Time Calculation (min): 29 min  Charges: OT General  Charges $OT Visit: 1 Visit OT Treatments $Therapeutic Activity: 8-22 mins  Lodema Hong, [place]  Pager [phone] Office [phone]   Trixie Dredge 11/30/2021, 12:34 PM

## 2021-11-30 NOTE — TOC Transition Note (Addendum)
Transition of Care [facility]) - CM/SW Discharge Note   Patient Details  Name: SLAYTON LUBITZ MRN: [id number] Date of Birth: 19-Oct-1951  Transition of [facility] CM/SW Contact:  Curlene Labrum, RN Phone Number: 11/30/2021, 7:52 AM   Clinical Narrative:    CM spoke with Chauncy Lean, SW with APS and she states that she spoke with the patient's friend, Jyl Heinz and received contact information for the patient's brother, in hopes of speaking with him to coordinate guardianship for the patient.  Laurance Flatten, SW states that if the patient's brother is unwilling to assume guardianship of the patient than she will file for guardianship with DSS.  The patient will need SNF placement and Cortrak removed before he is able to transfer to an accepting SNF facility.  11/30/2021 1021 - I called and spoke with Doyle Askew, CM at [facility] and a  FL2 was created for her to review for potential admission/bed offer.  The facility would not be able to accept the patient until the Cortrak is discontinued, hydrotherapy not needed and trach is cuff-less and 30 days since placement.  CM called and left a message with Vara Guardian, CM with [facility] to explore options for SNF placement for accepting facilities for tracheostomy capability.   Final next level of care: Skilled Nursing Facility Barriers to Discharge: Continued Medical Work up   Patient Goals and CMS Choice   CMS Medicare.gov Compare Post Acute Care list provided to:: Patient Represenative (must comment) (message left with Marsh Dolly, APS) Choice offered to / list presented to :  (APS - Titus Mould, SW finding family to be [place])  Discharge Placement                       Discharge Plan and Services   Discharge Planning Services: CM Consult Post Acute Care Choice: [place]                               Social Determinants of Health (SDOH) Interventions     Readmission Risk Interventions No  flowsheet data found.

## 2021-11-30 NOTE — Progress Notes (Signed)
Physical Therapy Treatment Patient Details Name: Jeffrey Mullen MRN: [id number] DOB: 01-Dec-1951 Today's Date: 11/30/2021   History of Present Illness 70 y/o male presented to ED on 10/23/21 after bystanders witnessed patient collapse with associated seizure. Another seizure witnessed in ED. CT head negative. CT C-spine negative. LTM EEG on 11/1 showed 2 seizures from R anterior temporal region. Last seizure noted on EEG on 11/3. Intubated 10/31-11/11. Trach placed 11/11. 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 Pt able to assist better for rolling to L side, needing up to modA and needing increased assist maxA +2 for log roll to EOB with bed rail and increased time. Pt needing min/modA for seated balance with support of bed rail. Pt agreeable to attempt to sit up in recliner chair for 1 hour at end of session, RN notified and encouraged to use Stedy with him for return transfer. Pt continues to benefit from PT services to progress toward functional mobility goals. Continue to recommend SNF.  Recommendations for follow up therapy are one component of a multi-disciplinary discharge planning process, led by the attending physician.  Recommendations may be updated based on patient status, additional functional criteria and insurance authorization.  Follow Up Recommendations  PT at Long-term acute care hospital     Assistance Recommended at Discharge Frequent or constant Supervision/Assistance  Equipment Recommendations  Other (comment) (TBD)    Recommendations for Other Services       Precautions / Restrictions  Precautions Precautions: Fall Precaution Comments: trach, cortrak, rectal tube, condom cath Restrictions Weight Bearing Restrictions: No     Mobility  Bed Mobility Overal bed mobility: Needs Assistance Bed Mobility: Rolling;Sidelying to Sit Rolling: Mod assist Sidelying to sit: Max assist;+2 for physical assistance;+2 for safety/equipment       General bed mobility comments: able to move BLEs towards EOB but was unable to complete. Required assistance for trunk from nearly flat HOB    Transfers Overall transfer level: Needs assistance Equipment used: Ambulation equipment used Transfers: Sit to/from Stand;Bed to chair/wheelchair/BSC Sit to Stand: +2 physical assistance;Max assist    General transfer comment: posterior leaning while standing in stedy, required assistance with trunk Transfer via Lift Equipment: Stedy  Ambulation/Gait    Pre-gait activities: able to minimally shift weight from foot to foot a few times while standing in stedy but too fatigued and heavy posterior lean to perform step pivot tf  He endorses mild dizziness when standing in [place] but unable to stand long enough for orthostatic BP reading.    Modified Rankin (Stroke Patients Only) Modified Rankin (Stroke Patients Only) Pre-Morbid Rankin Score: No symptoms Modified Rankin: Severe disability     Balance Overall balance assessment: Needs assistance Sitting-balance support: Feet supported;Bilateral upper extremity supported Sitting balance-Leahy Scale: Poor Sitting balance - Comments: mod assist for sitting balance due to left lateral lean Postural control: Left lateral lean;Posterior lean Standing balance support: Bilateral upper extremity supported Standing balance-Leahy Scale: Poor Standing balance comment: reliant on Stedy for support with balance in stance, able to sit with minA at EOB with 1-2 UE support but needs external assist while seated  Cognition Arousal/Alertness:  Awake/alert Behavior During Therapy: Flat affect Overall Cognitive Status: No family/caregiver present to determine baseline cognitive functioning Area of Impairment: Attention;Memory;Awareness;Safety/judgement;Following commands;Problem solving          Orientation Level: Disoriented to;Time;Situation Current Attention Level: Sustained Memory: Decreased short-term memory;Decreased recall of precautions Following Commands: Follows one step commands consistently;Follows one step commands with increased time Safety/Judgement: Decreased awareness of safety;Decreased awareness of deficits Awareness: Intellectual Problem Solving: Slow processing;Difficulty sequencing;Requires verbal cues;Requires tactile cues General Comments: responded to commands correctly, asked to repeat commands due to [place] Exercises - Lower Extremity Ankle Circles/Pumps: AROM;Both;10 reps;Supine Other Exercises Other Exercises: STS x2 trials    General Comments General comments (skin integrity, edema, etc.): small amount of secretions after coughing but VSS on RA throughout. Pt states 6/10 "tiredness" (RPE) after transferring      Pertinent Vitals/Pain Pain Assessment: Faces Pain Score: 7  Faces Pain Scale: Hurts even more Pain Location: neck pain Pain Descriptors / Indicators: Discomfort;Grimacing Pain Intervention(s): Monitored during session;Repositioned;Patient requesting pain meds-RN notified     PT Goals (current goals can now be found in the care plan section) Acute Rehab PT Goals Patient Stated Goal: unable to state PT Goal Formulation: With patient Time For Goal Achievement: 12/04/21 Progress towards PT goals: Progressing toward goals    Frequency    Min 3X/week      PT Plan Current plan remains appropriate    Co-evaluation PT/OT/SLP Co-Evaluation/Treatment: Yes Reason for Co-Treatment: Complexity of the patient's impairments (multi-system  involvement);Necessary to address cognition/behavior during functional activity;For patient/therapist safety;To address functional/ADL transfers PT goals addressed during session: Mobility/safety with mobility;Balance;Proper use of DME;Strengthening/ROM OT goals addressed during session: Other (comment) (EOB balance and transfers)      AM-PAC PT "6 Clicks" Mobility   Outcome Measure  Help needed turning from your back to your side while in a flat bed without using bedrails?: A Lot Help needed moving from lying on your back to sitting on the side of a flat bed without using bedrails?: Total Help needed moving to and from a bed to a chair (including a wheelchair)?: Total Help needed standing up from a chair using your arms (e.g., wheelchair or bedside chair)?: Total Help needed to walk in hospital room?: Total Help needed climbing 3-5 steps with a railing? : Total 6 Click Score: 7    End of Session Equipment Utilized During Treatment: Gait belt;Other (comment) (trach collar on humidified air) Activity Tolerance: Patient tolerated treatment well Patient left: in chair;with call bell/phone within reach;with chair alarm set (heels floated) Nurse Communication: Mobility status;Need for lift equipment;Other (comment) (use Stedy and +2 for return transfer) PT Visit Diagnosis: Other abnormalities of gait and mobility (R26.89);Other symptoms and signs involving the nervous system (R29.898);Muscle weakness (generalized) (M62.81)     Time: 2831-5176 PT Time Calculation (min) (ACUTE ONLY): 29 min  Charges:  $Therapeutic Activity: 8-22 mins                     Marylou Wages P., PTA Acute Rehabilitation Services Pager: [phone] Office: [place] 11/30/2021, 1:21 PM

## 2021-11-30 NOTE — Progress Notes (Signed)
Speech Language Pathology Treatment: Dysphagia;Passy Muir Speaking valve  Patient Details Name: Jeffrey Mullen MRN: [id number] DOB: [birth year] Today's Date: 11/30/2021 Time: 9826-4158 SLP Time Calculation (min) (ACUTE ONLY): 20 min  Assessment / Plan / Recommendation Clinical Impression  Pt seen at bedside to assess tolerance of current diet (dys/1/NTL) and determine readiness to advance textures and/or repeat instrumental study. Pt was observed during breakfast with PMSV in place. Pt appears to tolerate current diet without difficulty. He accepted few bites/sips of advanced textures, including softened graham cracker and thin liquids. No overt s/s aspiration observed on either advanced consistency, however, the risk for silent aspiration is greater in trach patients. NT reports pt accepts minimal amounts of PO intake at meals. Recommend repeating MBS Friday 12/01/21 to determine appropriateness for advanced consistencies, which will hopefully facilitate increased PO intake.   Pt tolerated PMSV placement during entire session with low intensity but clear voice quality. Cuff deflation was checked prior to donning valve. Pt is total assist for donning and doffing valve. He was receptive to education regarding removal of PMSV when sleeping and during breathing treatments. Upon removal of valve, secretions noted on the inside of the valve. PMSV was cleaned and placed in aqua cup to air dry. Safe swallow and PMSV precautions posted at [facility].    HPI HPI: 70 y/o male presented to ED on 10/23/21 after bystanders witnessed patient collapse with associated seizure. Another seizure witnessed in ED. CT head negative. CT C-spine negative. LTM EEG on 11/1 showed 2 seizures from R anterior temporal region. Last seizure noted on EEG on 11/3. Intubated 10/31-11/11. Trach placed 11/11. MRI on 11/10 showed multiple scattered acute ischemic infarcts involving bilateral cerebral hemispheres and L pons, largest measuring 4.9  cm at R occipital lobe. 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  Recommendations may be updated based on patient status, additional functional criteria and insurance authorization.    Recommendations  Diet recommendations: Dysphagia 1 (puree);Nectar-thick liquid Liquids provided via: Cup;Straw Medication Administration: Crushed with puree Supervision: Staff to assist with self feeding;Full supervision/cueing for compensatory strategies Compensations: Slow rate;Small sips/bites;Minimize environmental distractions;Lingual sweep for clearance of pocketing Postural Changes and/or Swallow Maneuvers: Seated upright 90 degrees;Upright 30-60 min after meal      Patient may use Passy-Muir Speech Valve: During all therapies with supervision PMSV Supervision: Full MD: Please consider changing trach tube to : Cuffless         General recommendations: Rehab consult Oral Care Recommendations: Oral care BID Follow Up Recommendations: Skilled nursing-short term rehab (<3 hours/day) Assistance recommended at discharge: Frequent or constant Supervision/Assistance SLP Visit Diagnosis: Aphonia (R49.1);Dysphagia, oropharyngeal phase (R13.12) Plan: Continue with current plan of care       [place] B. Quentin Ore, [facility], [place] Speech Language Pathologist Office: [phone]  Shonna Chock  11/30/2021, 9:45 AM

## 2021-12-01 ENCOUNTER — Inpatient Hospital Stay (HOSPITAL_COMMUNITY): Payer: Medicare Other

## 2021-12-01 DIAGNOSIS — I639 Cerebral infarction, unspecified: Secondary | ICD-10-CM | POA: Diagnosis not present

## 2021-12-01 DIAGNOSIS — G40901 Epilepsy, unspecified, not intractable, with status epilepticus: Secondary | ICD-10-CM | POA: Diagnosis not present

## 2021-12-01 DIAGNOSIS — Z930 Tracheostomy status: Secondary | ICD-10-CM | POA: Diagnosis not present

## 2021-12-01 DIAGNOSIS — T8089XD Other complications following infusion, transfusion and therapeutic injection, subsequent encounter: Secondary | ICD-10-CM | POA: Diagnosis not present

## 2021-12-01 LAB — GLUCOSE, CAPILLARY
Glucose-Capillary: 124 mg/dL — ABNORMAL HIGH (ref 70–99)
Glucose-Capillary: 149 mg/dL — ABNORMAL HIGH (ref 70–99)
Glucose-Capillary: 151 mg/dL — ABNORMAL HIGH (ref 70–99)
Glucose-Capillary: 155 mg/dL — ABNORMAL HIGH (ref 70–99)
Glucose-Capillary: 186 mg/dL — ABNORMAL HIGH (ref 70–99)

## 2021-12-01 LAB — CULTURE, BLOOD (ROUTINE X 2)
Culture: NO GROWTH
Culture: NO GROWTH
Special Requests: ADEQUATE
Special Requests: ADEQUATE

## 2021-12-01 MED ORDER — ENSURE ENLIVE PO LIQD
237.0000 mL | Freq: Two times a day (BID) | ORAL | Status: DC
Start: 2021-12-01 — End: 2021-12-04
  Administered 2021-12-01: 237 mL via ORAL

## 2021-12-01 NOTE — Procedures (Signed)
Tracheostomy Exchange Procedure Note  Jeffrey Mullen  [id number]  Aug 23, 1951  Date:12/01/21  Time:2:21 PM   Provider Performing:Pete E Kary Kos   Procedure: Tracheostomy Exchange Through Immature Stoma [phone])  Indication(s) Working towards decannulation   Consent Risks of the procedure as well as the alternatives and risks of each were explained to the patient and/or caregiver.  Consent for the procedure was obtained and is signed in the bedside chart  Anesthesia None   Time Out Verified patient identification, verified procedure, site/side was marked, verified correct patient position, special equipment/implants available, medications/allergies/relevant history reviewed, required imaging and test results available.   Sterile Technique Hand hygiene, gloves   Procedure Description Size 6 cuffed existing Shiley removed and size 6 uncuffed Shiley placed through stoma.   Complications/Tolerance None; patient tolerated the procedure well..   EBL Minimal Erick Colace ACNP-BC [place] Pager # [phone] OR # [phone] if no answer

## 2021-12-01 NOTE — Progress Notes (Signed)
NAME:  Jeffrey Mullen, MRN:  [id number], DOB:  [birth year], LOS: 52 ADMISSION DATE:  10/23/2021, CONSULTATION DATE:  10/31 REFERRING MD:  Dr. Dina Rich, CHIEF COMPLAINT:  AMS   History of Present Illness:  70 y/o M who presented to [facility] ER on 10/31 via EMS with reports of altered mental status.  Given  right sided gaze, altered and left sided weakness, code stoke called. The patient was taken emergently to CT for imaging which was negative for LVO.  While in the [place], he had a witnessed seizure.    PCCM called for ICU admission.   Chart review shows he was seen in the ER on 10/4 after passing out at the [employer].  Physical exam was negative at the time.  Labs negative.  No further imaging obtained.   Pertinent  Medical History  Alcohol Use - unclear quantity Anxiety / Depression  Congenital blindness of left eye, cataract on R Hepatitis C+, treated, better in 2016 Seizures   Significant Hospital Events: Including procedures, antibiotic start and stop dates in addition to other pertinent events   10/31 Admit with AMS, seizure, hypertensive emergency, AKI 11/1 MRI brain no acute intracranial process. No etiology is seen for the patient's seizure. 11/02 bedside LP attempted but unsuccessful  11/03  No seizures seen on EEG overnight  11/04 weaning sedation to assess for seizure reoccurrence  11/9 bronchoscopy with noted thick secretions in LLL 11/10 Left PICC placed 11/11 Trach,  Cor Track placed, New Embolic Strokes per MRI 50/53 Post Trach Day 1, some oozing from site, Echo results noted below 11/13 Low grade fever, tachy, LFT's remain elevated with ALT and Alk Phos continuing to rise.  S/p lasix  11/14 weaned PSV 10/5, no changes, foley removed 11/15 on ATC, TEE attempted- unable to pass probe, briefly on Neo, cortrak removed fort TEE, tmax 102.3 11/17 afebrile, NG on cultures, continues to have copious secretions 11/20 fever trend is down, on Ancef 11/22: only tolerating  PSV trials for 3 hours a day; broadened antibiotics to zosyn/vanc due to fever spike; recultured 12/9 MBS. Solid pharyngeal residue clears with a liquid wash, thin residue is mild except for one instance of sensed aspiration with thin and pill just before and after (due to backflow) the swallow. Pt had PMSV in place during testing and vocal quality and cough strength adequate to protect airway. He did have frequent coughing throughout testing not related to aspiration. Regular solids;Thin liquid.  12/9 changed trach to 6 cuffless. Starting capping trials    Interim History / Subjective:  Looking much better   Objective   Blood pressure 123/66, pulse 94, temperature 99.1 F (37.3 C), temperature source Oral, resp. rate 19, height _0  (1.778 m), weight 73.7 kg, SpO2 99 %.    FiO2 (%):  [21 %] 21 %   Intake/Output Summary (Last 24 hours) at 12/01/2021 1328 Last data filed at 12/01/2021 1000 Gross per 24 hour  Intake 1440 ml  Output 2950 ml  Net -1510 ml   Physical Exam: General 70 year old male. He is resting in bed. Not in any distress HENT NCAT no JVD has 6 cuffed trach w/ cuff deflated. Able to phonate w/ PMV in place. Tolerated finger occlusion but did feel a little more short of breath. Of note he did have some tracheal secretions and was able to phonate w//PMV off Pulm some scattered rhonchi Card rrr Abd soft  Neuro awake alert. Follows commands still hse right sided weakness.  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   -his mental status is improving. He is now on a diet. His speech quality is fairly good even with a deflated cuffed trach. He has improved to the point I wonder if he really still needs trach.   Plan Will change to cuffless and initiate capping trials over the weekend.  Place on [place] and cont Pulse ox  If he has no issues over the weekend then I think we can decannulate Monday   Erick Colace ACNP-BC [place] Pager # [phone] OR # [phone] if no answer

## 2021-12-01 NOTE — Plan of Care (Signed)
  Problem: Skin Integrity: Goal: Risk for impaired skin integrity will decrease Outcome: Not Progressing   Problem: Safety: Goal: Ability to remain free from injury will improve Outcome: Not Progressing   Problem: Elimination: Goal: Will not experience complications related to bowel motility Outcome: Not Progressing   Problem: Nutrition: Goal: Adequate nutrition will be maintained Outcome: Not Progressing   Problem: Clinical Measurements: Goal: Respiratory complications will improve Outcome: Not Progressing

## 2021-12-01 NOTE — Progress Notes (Signed)
Pt went down to IR for a swallowing function

## 2021-12-01 NOTE — Progress Notes (Signed)
Physical Therapy Treatment Patient Details Name: Jeffrey Mullen MRN: [id number] DOB: 07-11-1951 Today's Date: 12/01/2021   History of Present Illness 70 y/o male presented to ED on 10/23/21 after bystanders witnessed patient collapse with associated seizure. Another seizure witnessed in ED. CT head negative. CT C-spine negative. LTM EEG on 11/1 showed 2 seizures from R anterior temporal region. Last seizure noted on EEG on 11/3. Intubated 10/31-11/11. Trach placed 11/11. 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 Pt agreeable to therapy session however once beginning bed mobility/rolling pt noted to be incontinent of bowels and seeming to have malfunctioning rectal tube, RN notified and pt will need sheet change/bed bath prior to seated/standing mobility tasks. Pt needing min to modA for rolling to L/R sides with increased time and rail support. Pt motivated to progress mobility later after bath. Pt continues to benefit from PT services to progress toward functional mobility goals.    Recommendations for follow up therapy are one component of a multi-disciplinary discharge planning process, led by the attending physician.  Recommendations may be updated based on patient status, additional functional criteria and insurance authorization.  Follow Up Recommendations  PT at Long-term acute care hospital (long term SNF if he cannot get LTACH placement)     Assistance Recommended at Discharge Frequent or constant Supervision/Assistance  Equipment Recommendations  Other (comment) (TBD)    Recommendations for Other Services       Precautions / Restrictions Precautions Precautions:  Fall Precaution Comments: trach, cortrak, rectal tube, condom cath Restrictions Weight Bearing Restrictions: No     Mobility  Bed Mobility Overal bed mobility: Needs Assistance Bed Mobility: Rolling Rolling: Min assist         General bed mobility comments: hand over hand assist for UE placement and BLE placement for rolling. Once rolled to R side, noted he had significant BM on bed pad/sheets and may need new rectal tube placed so RN/NT notified    Transfers                         Modified Rankin (Stroke Patients Only) Modified Rankin (Stroke Patients Only) Pre-Morbid Rankin Score: No symptoms Modified Rankin: Severe disability        Cognition Arousal/Alertness: Awake/alert Behavior During Therapy: Flat affect Overall Cognitive Status: No family/caregiver present to determine baseline cognitive functioning Area of Impairment: Attention;Memory;Awareness;Safety/judgement;Following commands;Problem solving                 Orientation Level: Disoriented to;Time;Situation Current Attention Level: Sustained Memory: Decreased short-term memory;Decreased recall of precautions Following Commands: Follows one step commands consistently;Follows one step commands with increased time Safety/Judgement: Decreased awareness of safety;Decreased awareness of deficits Awareness: Intellectual Problem Solving: Slow processing;Difficulty sequencing;Requires verbal cues;Requires tactile cues General Comments: Pt very drowsy initially but able to be awoken with effort/increased time. pt cooperative with rolling/warm-up exercise but noted to be incontinent of bowels prior to EOB transfer so limited session due to RN/NT coming to room to bathe him prior to further mobility        Exercises General Exercises - Lower Extremity Ankle Circles/Pumps: AROM;Both;10 reps;Supine Heel Slides: AROM;Both;5 reps;Supine    General Comments General comments (skin integrity, edema,  etc.): VSS on 21% FiO2 via yellow medical air to trach mask      Pertinent Vitals/Pain Pain Assessment: 0-10 Pain Score: 7  Pain Location: neck pain Pain Descriptors / Indicators: Discomfort;Grimacing Pain Intervention(s): Limited activity within patient's tolerance;Monitored during session;Repositioned;Patient requesting pain meds-RN notified     PT Goals (current goals can now be found in the care plan section) Acute Rehab PT Goals Patient Stated Goal: unable to state PT Goal Formulation: With patient Time For Goal Achievement: 12/04/21 Progress towards PT goals: Progressing toward goals    Frequency    Min 3X/week      PT Plan Current plan remains appropriate       AM-PAC PT "6 Clicks" Mobility   Outcome Measure  Help needed turning from your back to your side while in a flat bed without using bedrails?: A Lot Help needed moving from lying on your back to sitting on the side of a flat bed without using bedrails?: Total Help needed moving to and from a bed to a chair (including a wheelchair)?: Total Help needed standing up from a chair using your arms (e.g., wheelchair or bedside chair)?: A Lot Help needed to walk in hospital room?: Total Help needed climbing 3-5 steps with a railing? : Total 6 Click Score: 8    End of Session Equipment Utilized During Treatment: Other (comment) (trach collar on humidified air) Activity Tolerance: Patient limited by lethargy;Other (comment) (limited due to rectal tube malfunction/drowsiness, will reassess after he has a bed bath and sheet change) Patient left: with call bell/phone within reach;in bed (heels floated) Nurse Communication: Mobility status;Need for lift equipment;Other (comment);Patient requests pain meds (he requests water (nectar thick, needs full Supervision)) PT Visit Diagnosis: Other abnormalities of gait and mobility (R26.89);Other symptoms and signs involving the nervous system (R29.898);Muscle weakness  (generalized) (M62.81)     Time: 5038-8828 PT Time Calculation (min) (ACUTE ONLY): 8 min  Charges:  $Therapeutic Activity: 8-22 mins                     Loa Idler P., PTA Acute Rehabilitation Services Pager: [phone] Office: [place] 12/01/2021, 5:32 PM

## 2021-12-01 NOTE — Progress Notes (Addendum)
Physical Therapy Treatment Patient Details Name: Jeffrey Mullen MRN: [id number] DOB: [birth year] Today's Date: 12/01/2021   History of Present Illness 70 y/o male presented to ED on 10/23/21 after bystanders witnessed patient collapse with associated seizure. Another seizure witnessed in ED. CT head negative. CT C-spine negative. LTM EEG on 11/1 showed 2 seizures from R anterior temporal region. Last seizure noted on EEG on 11/3. Intubated 10/31-11/11. Trach placed 11/11. 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 Pt able to stand to [facility] with modA and performed bed mobility with +1 maxA and improved initiation this session. Pt motivated by his progress and expressed optimism regarding his recovery. VSS with trach mask and PMV in place prior to PTA entry with good tolerance throughout. Pt continues to benefit from PT services to progress toward functional mobility goals. Continue to recommend post-acute rehab at a low intensity.   Recommendations for follow up therapy are one component of a multi-disciplinary discharge planning process, led by the attending physician.  Recommendations may be updated based on patient status, additional functional criteria and insurance authorization.  Follow Up Recommendations  PT at Long-term acute care hospital (long term SNF if he cannot get LTACH placement)     Assistance Recommended at Discharge Frequent or constant Supervision/Assistance  Equipment Recommendations  Other (comment) (TBD)    Recommendations for Other Services        Precautions / Restrictions Precautions Precautions: Fall Precaution Comments: trach, cortrak, condom cath Restrictions Weight Bearing Restrictions: No     Mobility  Bed Mobility Overal bed mobility: Needs Assistance Bed Mobility: Rolling;Sidelying to Sit Rolling: Min assist Sidelying to sit: Max assist;HOB elevated       General bed mobility comments: hand over hand assist for UE placement and trunk lifting with log roll to seated posture, he was able to prop up on RUE while lifting trunk today. Pt able to initiate roll from supine to R side with increased time.    Transfers Overall transfer level: Needs assistance Equipment used: Ambulation equipment used Transfers: Sit to/from Stand;Bed to chair/wheelchair/BSC Sit to Stand: Mod assist;From elevated surface           General transfer comment: Cues for sequencing with good carryover, pt does not endorse dizziness today while standing in [place], no buckling or LOB able to stand ~3 mins prior to sitting. Seated scoot with minimal propulsion achieved and maxA using bed pad Transfer via Lift Equipment: [place]  Ambulation/Gait             Pre-gait activities: standing RLE marching in [place] and standing heel raises (only one at a time 2/2 BLE weakness). he could not lift LLE due to RLE weakness     Stairs             Wheelchair Mobility    Modified Rankin (Stroke Patients Only) Modified Rankin (Stroke Patients Only) Pre-Morbid Rankin Score: No symptoms Modified Rankin: Severe disability     Balance Overall balance assessment: Needs assistance Sitting-balance support: Feet supported;Bilateral upper extremity supported Sitting balance-Leahy Scale: Poor Sitting balance - Comments: mod assist for sitting balance due to left lateral  lean at EOB and in [place] Postural control: Left lateral lean;Posterior lean Standing balance support: Bilateral upper extremity supported Standing balance-Leahy Scale:  Poor Standing balance comment: reliant on [place] for support with balance in stance and modA for standing exercise                            Cognition Arousal/Alertness: Awake/alert Behavior During Therapy: Flat affect Overall Cognitive Status: No family/caregiver present to determine baseline cognitive functioning Area of Impairment: Attention;Memory;Awareness;Safety/judgement;Following commands;Problem solving                 Orientation Level: Disoriented to;Time;Situation Current Attention Level: Sustained Memory: Decreased short-term memory;Decreased recall of precautions Following Commands: Follows one step commands consistently;Follows one step commands with increased time Safety/Judgement: Decreased awareness of safety;Decreased awareness of deficits Awareness: Intellectual Problem Solving: Slow processing;Difficulty sequencing;Requires verbal cues;Requires tactile cues General Comments: cooperative, motivated; some possible delirium or other confusion as he states "where are the football players?" once sitting EOB however had been discussing his favorite football team prior to this statement. Otherwise following 1-step commands well.        Exercises General Exercises - Lower Extremity Ankle Circles/Pumps: AROM;Both;10 reps;Supine Heel Slides: AROM;Both;5 reps;Supine Heel Raises: AROM;Right;Left;10 reps;Standing (one heel at a time in [place]) Other Exercises Other Exercises: STS x2 trials from EOB with [place] Other Exercises: static standing in [place] for BLE strengthening 1 to 3 minutes at a time    General Comments General comments (skin integrity, edema, etc.): VSS on 21% FiO2 via yellow medical air to trach mask      Pertinent Vitals/Pain Pain Assessment: Faces Pain Score: 7  Faces Pain Scale: Hurts little more Pain Location: neck pain Pain Descriptors / Indicators: Discomfort;Grimacing Pain Intervention(s): Limited activity within patient's  tolerance;Monitored during session;Repositioned;Premedicated before session     PT Goals (current goals can now be found in the care plan section) Acute Rehab PT Goals Patient Stated Goal: to get stronger and walk again PT Goal Formulation: With patient Time For Goal Achievement: 12/04/21 Progress towards PT goals: Progressing toward goals    Frequency    Min 3X/week      PT Plan Current plan remains appropriate       AM-PAC PT "6 Clicks" Mobility   Outcome Measure  Help needed turning from your back to your side while in a flat bed without using bedrails?: A Lot Help needed moving from lying on your back to sitting on the side of a flat bed without using bedrails?: A Lot Help needed moving to and from a bed to a chair (including a wheelchair)?: Total Help needed standing up from a chair using your arms (e.g., wheelchair or bedside chair)?: A Lot Help needed to walk in hospital room?: Total Help needed climbing 3-5 steps with a railing? : Total 6 Click Score: 9    End of Session Equipment Utilized During Treatment: Gait belt;Other (comment) (trach collar on humidified air) Activity Tolerance: Patient tolerated treatment well Patient left: with call bell/phone within reach;in bed (HOB elevated >40 degrees as pt wanting to drink water) Nurse Communication: Mobility status;Need for lift equipment;Other (comment);Patient requests pain meds (he requests water (nectar thick, needs full Supervision)) PT Visit Diagnosis: Other abnormalities of gait and mobility (R26.89);Other symptoms and signs involving the nervous system (R29.898);Muscle weakness (generalized) (M62.81)     Time: 1937-9024 PT Time Calculation (min) (ACUTE ONLY): 23 min  Charges:  $Therapeutic Exercise: 8-22 mins  Houston [place]., PTA Acute Rehabilitation Services Pager: [phone] Office: [place] 12/01/2021, 5:45 PM

## 2021-12-01 NOTE — Progress Notes (Signed)
Modified Barium Swallow Progress Note  Patient Details  Name: Jeffrey Mullen MRN: [id number] Date of Birth: 1951/07/01  Today's Date: 12/01/2021  Modified Barium Swallow completed.  Full report located under Chart Review in the Imaging Section.  Brief recommendations include the following:  Clinical Impression  Pt demonstrates a mild persistent oral dysphagia with slow oral preparatory phase with some lingual rocking and pumping prior to masticated bolus transit. Mastication however is adequate and complete. There is an ongoing primary esoaphgeal dysphagia with mild backflow of thin and solids to pyriform sinuses and signs of retrograde flow during esophageal sweep. Solid pharyngeal residue clears with a liquid wash, thin residue is mild except for one instance of sensed aspiration with thin and pill just before and after (due to backflow) the swallow. Pt had PMSV in place during testing and vocal quality and cough strength adequate to protect airway. He did have frequent coughing throughout testing not related to aspriation. Suspect there may be sensation of esophageal dysphagia. Recommend pt resume a regular diet and thin liquids with esophageal precautions and PMSV in place for meals. Pills to be given crushed in puree. Risk of postprandial aspiration still present. Will trial diet to determine pts tolerance.    Swallow Evaluation Recommendations       SLP Diet Recommendations: Regular solids;Thin liquid   Liquid Administration via: Cup;Straw   Medication Administration: Crushed with puree   Supervision: Staff to assist with self feeding;Intermittent supervision to cue for compensatory strategies   Compensations: Slow rate;Small sips/bites;Minimize environmental distractions;Follow solids with liquid   Postural Changes: Seated upright at 90 degrees;Remain semi-upright after after feeds/meals (Comment)   Oral Care Recommendations: Oral care BID        Angelice Piech, Katherene Ponto 12/01/2021,10:32 AM

## 2021-12-01 NOTE — Progress Notes (Signed)
[facility] PROGRESS NOTE  DORR PERROT QBH:[id number] DOB: August 20, 1951 DOA: 10/23/2021 PCP: [name], NP  Status: Remains inpatient appropriate because:  Unsafe discharge plan; currently not medically stable  Barriers to discharge: Social: Patient unable to independently make decisions regarding medical care-guardianship process pending  Clinical: Core track in place, trach tube in place, significantly impaired mobility secondary to right hemiparesis with therapies rec LTAC  Level of care:  Progressive   Code Status: DNR Family Communication: Has no family available to assist.  Girlfriend also unable to assist with patient after discharge. DVT prophylaxis: Subcutaneous heparin COVID vaccination status: 02/18/2020, 04/25/2020 and 11/11/2020  HPI: 70 y/o M with past medical history of hypertension, glaucoma who presented to [facility] ER on 10/31 via EMS with reports of altered mental status.  Given  right sided gaze, altered and left sided weakness, code stoke called. The patient was taken emergently to CT for imaging which was negative for LVO.  While in the [place], he had a witnessed seizure.  PCCM called for ICU admission. Chart review shows he was seen in the ER on 10/4 after passing out at the [employer].  Physical exam was negative at the time.  Labs negative.  No further imaging obtained.    Subjective: Sleeping soundly and did not awaken for exam.  Objective: Vitals:   12/01/21 0358 12/01/21 0727  BP: 135/64 135/75  Pulse: 83 83  Resp: 20 16  Temp:  98.6 F (37 C)  SpO2: 100% 96%    Intake/Output Summary (Last 24 hours) at 12/01/2021 0736 Last data filed at 12/01/2021 0973 Gross per 24 hour  Intake 1440 ml  Output 2950 ml  Net -1510 ml   Filed Weights   11/16/21 0500 11/17/21 0408 11/18/21 0423  Weight: 82.8 kg 77.3 kg 73.7 kg    Exam:  Constitutional: NAD, calm Respiratory: 6.0 cuffed trach 5 L, trach site unremarkable with some yellow-no  increased work of breathing while sleeping and lungs otherwise coarse to auscultation Cardiovascular: Regular rate, S1-S2.  No extremity edema.  Normotensive Abdomen: Soft and nontender with normoactive bowel sounds.  Cortrack tube.  Rectal tube in place with nonwatery brown stool Skin: Left arm wound covered with dressing which is clean dry and intact Neurologic: CN 2-12 grossly intact. Sensation intact, Strength 3-4/5 on left, marked decrease strength on right: Arm is 2/5 and leg is 3/5 Psychiatric:  alert and appropriately interactive.  Able to vocalize and follow simple commands-oriented to name and place  Assessment/Plan: Acute problems: Acute multifocal bilateral embolic stroke TTE: No embolic source was identified.  TEE was attempted but could not be performed successfully. LDL 66.  HbA1c 6.0.  Continue aspirin and statin Noted to be quite deconditioned.  PT and OT.   Status epilepticus Resolved.   Continue Keppra and lacosamide.     Acute metabolic encephalopathy Encephalopathy is likely secondary to all of his acute issues including epilepsy and stroke and acute respiratory failure.   Sepsis likely secondary to tracheitis Trach cultures were positive for MSSA and completed all antibiotics by 11/28 Repeat sputum culture on 12 /1 with few MSSA resistant to inducible clindamycin, erythromycin and clindamycin-Since fevers are low-grade Bactrim initiated  Tracheostomy dependent Has cuffed trach in place and stable on room air Trach team following-tolerating longer periods of PMSV Currently secretions are too thick and cough effort to weak to be considered for decannulation  Dysphagia 12/9 reevaluated by speech therapy and now recommending regular solids with thin liquids to  be in place with meds crushed in pure Continue nocturnal tube feedings via Cortrak left intake at least 50% for most meals over the weekend likely can remove core track to   Left cephalic vein superficial  thrombophlebitis/left arm extravasation injury Status post phentolamine and nitroglycerin application.   Continue local wound /hydrotherapy noting significant improvement in appearance of the wound as of 12/7      10/23/2021                  11/20/2021     11/29/2021  Hypernatremia/high volume stool output 2/2 free H20 deficit   As of 12/3 sodium had improved to 144-repeat in a.m. 12/9 Continue free water per tube as well as fiber supplement/Imodium for diarrhea  Physical deconditioning Finally aware of how debilitated he is Rec is for LTAC vs trach capable SNF   Essential hypertension/grade 2 diastolic dysfunction Continue amlodipine, carvedilol Blood pressure controlled patient remains  Anemia of critical illness Hemoglobin 10.7 on 11/28   Hypokalemia Resolved with potassium 4.2 as of 32/20   Alcoholic liver cirrhosis PCP notes document history of alcohol abuse and known cirrhosis LFTs stable.  RUQ ultrasound c/w cirrhosis.  Unremarkable appearance of gallbladder and biliary tree was noted. Ammonia level 44 in context of slightly elevated BUN-given current high-volume diarrhea not a candidate for lactulose at this juncture  Acute kidney injury due to contrast-induced ATN Resolved.   Current creatinine 0.86  Nontraumatic rhabdomyolysis Resolved   Goals of care Palliative medicine has been following.  They had been communicating with patient's former significant other.  Apparently this person requested that she no longer wanted to be the surrogate decision maker.  Previously his biological daughter has also mentioned that she does not want to be the primary decision maker.  Social worker to help conduct search for alternative family members/next of kin. After discussions with critical care medicine and with two-physician documentation patient's CODE STATUS was changed to DNR.  See note from 11/27.    Data Reviewed: Basic Metabolic Panel: Recent Labs  Lab  11/25/21 0420  NA 144  K 4.0  CL 111  CO2 23  GLUCOSE 169*  BUN 31*  CREATININE 0.86  CALCIUM 9.4   Liver Function Tests: Recent Labs  Lab 11/25/21 0420  AST 78*  ALT 137*  ALKPHOS 262*  BILITOT 0.5  PROT 8.0  ALBUMIN 2.5*    CBC: Recent Labs  Lab 11/25/21 0420 11/27/21 0831  WBC 8.4 9.4  NEUTROABS 5.3 5.0  HGB 10.3* 10.8*  HCT 32.4* 33.2*  MCV 89.5 88.3  PLT 322 242    CBG: Recent Labs  Lab 11/30/21 0731 11/30/21 1222 11/30/21 2040 12/01/21 0023 12/01/21 0346  GLUCAP 77 121* 167* 149* 155*    Recent Results (from the past 240 hour(s))  Culture, Respiratory w Gram Stain     Status: None   Collection Time: 11/23/21 11:50 AM   Specimen: Tracheal Aspirate; Respiratory  Result Value Ref Range Status   Specimen Description TRACHEAL ASPIRATE  Final   Special Requests NONE  Final   Gram Stain   Final    WBC PRESENT,BOTH PMN AND MONONUCLEAR RARE GRAM POSITIVE COCCI Performed at [facility], [place] [address] [place], [place] [address]    Culture FEW STAPHYLOCOCCUS AUREUS  Final   Report Status 11/26/2021 FINAL  Final   Organism ID, Bacteria STAPHYLOCOCCUS AUREUS  Final      Susceptibility   Staphylococcus aureus - MIC*    CIPROFLOXACIN <=0.5  SENSITIVE Sensitive     ERYTHROMYCIN >=8 RESISTANT Resistant     GENTAMICIN <=0.5 SENSITIVE Sensitive     OXACILLIN 0.5 SENSITIVE Sensitive     TETRACYCLINE <=1 SENSITIVE Sensitive     VANCOMYCIN 1 SENSITIVE Sensitive     TRIMETH/SULFA <=10 SENSITIVE Sensitive     CLINDAMYCIN RESISTANT Resistant     RIFAMPIN <=0.5 SENSITIVE Sensitive     Inducible Clindamycin POSITIVE Resistant     * FEW STAPHYLOCOCCUS AUREUS  Culture, blood (Routine X 2) w Reflex to ID Panel     Status: None (Preliminary result)   Collection Time: 11/26/21 10:10 AM   Specimen: BLOOD RIGHT HAND  Result Value Ref Range Status   Specimen Description BLOOD RIGHT HAND  Final   Special Requests   Final    BOTTLES DRAWN AEROBIC AND ANAEROBIC  Blood Culture adequate volume   Culture   Final    NO GROWTH 4 DAYS Performed at [facility], [address] [place], [place] [address]    Report Status PENDING  Incomplete  Culture, blood (Routine X 2) w Reflex to ID Panel     Status: None (Preliminary result)   Collection Time: 11/26/21 10:25 AM   Specimen: BLOOD RIGHT HAND  Result Value Ref Range Status   Specimen Description BLOOD RIGHT HAND  Final   Special Requests   Final    BOTTLES DRAWN AEROBIC AND ANAEROBIC Blood Culture adequate volume   Culture   Final    NO GROWTH 4 DAYS Performed at [facility], [place] [address] [place], [place] [address]    Report Status PENDING  Incomplete      Scheduled Meds:  amLODipine  10 mg Per Tube Daily   aspirin  81 mg Per Tube Daily   atorvastatin  40 mg Per Tube Daily   carvedilol  12.5 mg Per Tube BID WC   chlorhexidine  15 mL Mouth Rinse BID   collagenase   Topical BID   dorzolamide-timolol  1 drop Right Eye BID   feeding supplement (JEVITY 1.5 CAL/FIBER)  900 mL Per Tube Q24H   feeding supplement (PROSource TF)  45 mL Per Tube QID   fiber  1 packet Per Tube BID   folic acid  1 mg Per Tube Daily   free water  300 mL Per Tube Q4H   heparin  5,000 Units Subcutaneous Q8H   insulin aspart  0-15 Units Subcutaneous Q4H   lacosamide  200 mg Per Tube BID   latanoprost  1 drop Right Eye QHS   levETIRAcetam  500 mg Per Tube BID   loperamide HCl  1 mg Per Tube Daily   mouth rinse  15 mL Mouth Rinse q12n4p   multivitamin with minerals  1 tablet Per Tube Daily   nicotine  14 mg Transdermal Daily   nutrition supplement (JUVEN)  1 packet Per Tube BID BM   pantoprazole sodium  40 mg Per Tube QHS   saccharomyces boulardii  250 mg Per Tube BID   scopolamine  1 patch Transdermal Q72H   sodium chloride flush  10-40 mL Intracatheter Q12H   sulfamethoxazole-trimethoprim  20 mL Oral Q12H   thiamine  100 mg Per Tube Daily   Continuous Infusions:  sodium chloride Stopped (11/07/21  0016)   sodium chloride 5 mL/hr at 11/19/21 0949    Active Problems:   Status epilepticus ([place])   Acute respiratory failure with hypercapnia (HCC)   Acute encephalopathy   Cryptogenic stroke (  [place])   Pressure injury of skin   AKI (acute kidney injury) ([place])   Seizure ([place])   Acute respiratory failure with hypoxia ([place])   Status post tracheostomy ([place])   Hypokalemia   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 ([place])   Oropharyngeal dysphagia   Consultants: Neurology PCCM Nephrology  Procedures: EEG Echocardiogram a bubble study Core track Tracheostomy Lumbar puncture in interventional radiology  Antibiotics: Acyclovir 11/1 through 11/4 Azithromycin 11/4 through 11/7 Ceftriaxone 11/4 through 11/7 Linezolid 11/4 through 11/5 Unasyn 11/8 through 11/11 Ceftriaxone 11/11 through 11/12 Zosyn 11/17 through 11/19 Cefazolin 11/19 through 11/21 Zosyn 11/22 through 11/28 Vancomycin 11/22 through 11/28   Time spent: 30 minutes    Erin Hearing ANP  [facility] 7 am - 330 pm/M-F for direct patient care and secure chat Please refer to [place] for contact info 39  days

## 2021-12-01 NOTE — Progress Notes (Signed)
Calorie Count Note  48-72 hour calorie count ordered.  Diet: previously dysphagia 2 with nectar thick liquids, now advanced to regular diet with thin liquids Supplements: Vital Cuisine Shake po BID, each supplement provides 520 kcal and 22 grams of protein  Pt with minimal PO intake over 12/08. Pt ate 0% of entrees offered. Over course of entire day, pt consumed 50% of 1 Magic Cup (145 kcals, 4.5 grams protein), 10% of a vital cuisine shake (52 kcals, 2 grams protein), and 1 bite of puree peaches with nectar thick sweet tea (calories/protein negligible)  Total intake: 197 kcal (9.4% of minimum estimated needs)  6.5 grams protein (5.4% of minimum estimated needs)  NUTRITION DIAGNOSIS:  Inadequate oral intake related to inability to eat as evidenced by NPO status. -- Progressing, pt on regular diet   GOAL:  Patient will meet greater than or equal to 90% of their needs -- Addressing with TF  INTERVENTION:  -Continue calorie count -- RD will follow-up with results on Monday, 12/12 -Continue nocturnal TF via Cortrak:             -Jevity 1.5 @ 56ml/hr x 12 hours (967ml), to be administered from 1800-0600             -52ml Prosource TF QID             -Free water flush per MD/NP, currently 360ml Q4H             Provides 1510 kcals, 101 grams protein, 630ml free water (2453ml total free water)             Meets ~72% and ~85% of pt's minimum estimated calorie and protein needs, respectively    -Continue Juven BID per tube for wound healing -Continue MVI with minerals daily -d/c Vital Cuisine -Ensure Enlive po BID, each supplement provides 350 kcal and 20 grams of protein   Estill Bamberg A., MS, RD, LDN (she/her/hers) RD pager number and weekend/on-call pager number located in [place].

## 2021-12-01 NOTE — Progress Notes (Signed)
Physical Therapy Wound Treatment Patient Details  Name: Jeffrey Mullen MRN: [id number] Date of Birth: 05/21/1951  Today's Date: 12/01/2021 Time:  -     Subjective  Subjective Assessment Subjective: Pt verbalize something about boots Patient and Family Stated Goals: pt unable to participate Date of Onset:  (unknown) Prior Treatments: dressing changes  Pain Score:  5/10  less meds today  Wound Assessment     Wound / Incision (Open or Dehisced) 10/27/21 Skin tear Arm Left;Lower;Posterior torn blisters with open skin (Active)  Wound Image   11/29/21 1212  Dressing Type Gauze (Comment) 12/01/21 0800  Dressing Changed Changed 11/29/21 1212  Dressing Status Clean;Dry;Intact 12/01/21 0800  Dressing Change Frequency Daily 12/01/21 0800  Site / Wound Assessment Bleeding;Pink;Red;Yellow 11/29/21 1212  % Wound base Red or Granulating 80% 11/29/21 1212  % Wound base Yellow/Fibrinous Exudate 20% 11/29/21 1212  % Wound base Black/Eschar 0% 11/29/21 1212  % Wound base Other/Granulation Tissue (Comment) 0% 11/29/21 1212  Peri-wound Assessment Intact 12/01/21 0800  Wound Length (cm) 15 cm 11/29/21 1212  Wound Width (cm) 8 cm 11/29/21 1212  Wound Depth (cm) 0 cm 11/29/21 1212  Wound Volume (cm^3) 0 cm^3 11/29/21 1212  Wound Surface Area (cm^2) 120 cm^2 11/29/21 1212  Margins Unattached edges (unapproximated) 12/01/21 0800  Closure None 12/01/21 0800  Drainage Amount Minimal 12/01/21 0800  Drainage Description Serosanguineous 12/01/21 0800  Treatment Cleansed;Debridement (Selective);Hydrotherapy (Pulse lavage);Packing (Saline gauze) 11/29/21 1212      Hydrotherapy Pulsed lavage therapy - wound location: L dorsal forearm Pulsed Lavage with Suction (psi): 8 psi (4-12) Pulsed Lavage with Suction - Normal Saline Used: 1000 mL Pulsed Lavage Tip: Tip with splash shield Selective Debridement Selective Debridement - Location: left dorsal forearm Selective Debridement - Tools Used: Forceps,  Scissors Selective Debridement - Tissue Removed: brown/yellow nonviable tissue    Wound Assessment and Plan  Wound Therapy - Assess/Plan/Recommendations Wound Therapy - Clinical Statement: Pt making steady progress with removal of nonviable tissue of forearm wound. Wound Therapy - Functional Problem List: significant debilitation and low mobility Factors Delaying/Impairing Wound Healing: Altered sensation, Immobility, Substance abuse, Tobacco use Hydrotherapy Plan: Debridement, Dressing change, Patient/family education, Pulsatile lavage with suction Wound Therapy - Frequency: 3X / week Wound Therapy - Current Recommendations: WOC nurse Wound Therapy - Follow Up Recommendations: dressing changes by RN, Other (comment)  Wound Therapy Goals- Improve the function of patient's integumentary system by progressing the wound(s) through the phases of wound healing (inflammation - proliferation - remodeling) by: Wound Therapy Goals - Improve the function of patient's integumentary system by progressing the wound(s) through the phases of wound healing by: Decrease Necrotic Tissue to: 15 Decrease Necrotic Tissue - Progress: Progressing toward goal Increase Granulation Tissue to: 85 Increase Granulation Tissue - Progress: Progressing toward goal Goals/treatment plan/discharge plan were made with and agreed upon by patient/family: No, Patient unable to participate in goals/treatment/discharge plan and family unavailable Time For Goal Achievement: 7 days Wound Therapy - Potential for Goals: Good  Goals will be updated until maximal potential achieved or discharge criteria met.  Discharge criteria: when goals achieved, discharge from hospital, MD decision/surgical intervention, no progress towards goals, refusal/missing three consecutive treatments without notification or medical reason.  GP     Charges         Kenneth V Mottinger 12/01/2021, 12:42 PM12/08/2021  Ken M., PT Acute Rehabilitation  Services [phone]  (pager) [phone]  (office)   

## 2021-12-02 DIAGNOSIS — G9340 Encephalopathy, unspecified: Secondary | ICD-10-CM | POA: Diagnosis not present

## 2021-12-02 DIAGNOSIS — J9601 Acute respiratory failure with hypoxia: Secondary | ICD-10-CM | POA: Diagnosis not present

## 2021-12-02 DIAGNOSIS — J9602 Acute respiratory failure with hypercapnia: Secondary | ICD-10-CM | POA: Diagnosis not present

## 2021-12-02 LAB — GLUCOSE, CAPILLARY
Glucose-Capillary: 105 mg/dL — ABNORMAL HIGH (ref 70–99)
Glucose-Capillary: 116 mg/dL — ABNORMAL HIGH (ref 70–99)
Glucose-Capillary: 119 mg/dL — ABNORMAL HIGH (ref 70–99)
Glucose-Capillary: 127 mg/dL — ABNORMAL HIGH (ref 70–99)
Glucose-Capillary: 148 mg/dL — ABNORMAL HIGH (ref 70–99)
Glucose-Capillary: 150 mg/dL — ABNORMAL HIGH (ref 70–99)
Glucose-Capillary: 154 mg/dL — ABNORMAL HIGH (ref 70–99)

## 2021-12-02 NOTE — Progress Notes (Signed)
PROGRESS NOTE  Jeffrey Mullen JAS:[id number] DOB: [birth year] DOA: 10/23/2021 PCP: [name], NP   LOS: 40 days   Brief Narrative / Interim history: 70 y/o M with past medical history of hypertension, glaucoma who presented to [facility] ER on 10/31 via EMS with reports of altered mental status.  Given  right sided gaze, altered and left sided weakness, code stoke called. The patient was taken emergently to CT for imaging which was negative for LVO.  While in the [place], he had a witnessed seizure.  PCCM called for ICU admission. Chart review shows he was seen in the ER on 10/4 after passing out at the [employer].  Physical exam was negative at the time.  Labs negative.  No further imaging obtained.   Subjective / 24h Interval events: Complains of a cough as well as back pain  Assessment & Plan: Principal Problem Acute multifocal bilateral embolic stroke -TTE: No embolic source was identified.  TEE was attempted but could not be performed successfully. LDL 66.  HbA1c 6.0.  Continue aspirin and statin. Noted to be quite deconditioned.  PT and OT.   Active problems Status epilepticus -Resolved.   Continue Keppra and lacosamide.     Acute metabolic encephalopathy -Encephalopathy is likely secondary to all of his acute issues including epilepsy and stroke and acute respiratory failure.  Overall improving, alert, able to communicate clearly   Sepsis likely secondary to tracheitis -Trach cultures were positive for MSSA and completed all antibiotics by 11/28. 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  Stable   Dysphagia -12/9 reevaluated by speech therapy and now recommending regular solids with thin liquids to be in place with meds crushed in pure. Continue nocturnal tube feedings via Cortrak left intake at  least 50% for most meals over the weekend likely can remove core track to   Left cephalic vein superficial thrombophlebitis/left arm extravasation injury -Status post phentolamine and nitroglycerin application.  Continue local wound /hydrotherapy noting significant improvement in appearance of the wound as of 12/7  Hypernatremia/high volume stool output -2/2 free H20 deficit .  Recheck sodium tomorrow morning. As of 12/3 sodium had improved to 144-repeat in a.m. 12/9   Physical deconditioning -Finally aware of how debilitated he is. Rec is for LTAC vs trach capable SNF   Essential hypertension/grade 2 diastolic dysfunction -Continue amlodipine, carvedilol. Blood pressure controlled patient remains  Anemia of critical illness -Hemoglobin 10.7 on 11/28   Hypokalemia -Resolved   Alcoholic liver cirrhosis -PCP notes document history of alcohol abuse and known cirrhosis LFTs stable.  RUQ ultrasound c/w cirrhosis.  Unremarkable appearance of gallbladder and biliary tree was noted.  Acute kidney injury due to contrast-induced ATN -Resolved.    Nontraumatic rhabdomyolysis -Resolved   Goals of care -Palliative medicine has been following.  They had been communicating with patient's former significant other.  Apparently this person requested that she no longer wanted to be the surrogate decision maker.  Previously his biological daughter has also mentioned that she does not want to be the primary decision maker.  Social worker to help conduct search for alternative family members/next of kin. After discussions with critical care medicine and with two-physician documentation patient's CODE STATUS was changed to DNR.  See note from 11/27.  Scheduled Meds:  amLODipine  10 mg Per Tube Daily   aspirin  81 mg Per Tube Daily   atorvastatin  40 mg Per Tube Daily   carvedilol  12.5 mg Per Tube BID WC   chlorhexidine  15 mL Mouth Rinse BID   collagenase   Topical BID   dorzolamide-timolol  1 drop Right  Eye BID   feeding supplement  237 mL Oral BID BM   feeding supplement (JEVITY 1.5 CAL/FIBER)  900 mL Per Tube Q24H   feeding supplement (PROSource TF)  45 mL Per Tube QID   fiber  1 packet Per Tube BID   folic acid  1 mg Per Tube Daily   free water  300 mL Per Tube Q4H   heparin  5,000 Units Subcutaneous Q8H   insulin aspart  0-15 Units Subcutaneous Q4H   lacosamide  200 mg Per Tube BID   latanoprost  1 drop Right Eye QHS   levETIRAcetam  500 mg Per Tube BID   loperamide HCl  1 mg Per Tube Daily   mouth rinse  15 mL Mouth Rinse q12n4p   multivitamin with minerals  1 tablet Per Tube Daily   nicotine  14 mg Transdermal Daily   nutrition supplement (JUVEN)  1 packet Per Tube BID BM   pantoprazole sodium  40 mg Per Tube QHS   saccharomyces boulardii  250 mg Per Tube BID   scopolamine  1 patch Transdermal Q72H   sodium chloride flush  10-40 mL Intracatheter Q12H   sulfamethoxazole-trimethoprim  20 mL Oral Q12H   thiamine  100 mg Per Tube Daily   Continuous Infusions:  sodium chloride Stopped (11/07/21 0016)   sodium chloride 5 mL/hr at 11/19/21 0949   PRN Meds:.sodium chloride, acetaminophen (TYLENOL) oral liquid 160 mg/5 mL, docusate, fentaNYL (SUBLIMAZE) injection, guaiFENesin, hydrALAZINE, ipratropium-albuterol, labetalol, loperamide HCl, polyethylene glycol, sodium chloride flush  Diet Orders (From admission, onward)     Start     Ordered   12/01/21 1014  Diet regular Room service appropriate? Yes; Fluid consistency: Thin  Diet effective now       Question Answer Comment  Room service appropriate? Yes   Fluid consistency: Thin      12/01/21 1014            DVT prophylaxis: heparin injection 5,000 Units Start: 10/28/21 1400 SCDs Start: 10/23/21 1639     Code Status: DNR  Family Communication: no family at bedside   Status is: Inpatient  Remains inpatient appropriate because: trach, placement  Level of care: Progressive  Consultants:   PCCM  Objective: Vitals:   12/01/21 2341 12/02/21 0355 12/02/21 0717 12/02/21 0814  BP: 135/71 114/68 131/75   Pulse: 83 88 91 85  Resp: 16 (!) 21 20 18   Temp: 98.9 F (37.2 C) 99.1 F (37.3 C) 98.8 F (37.1 C)   TempSrc: Oral Oral Oral   SpO2: 100% 100% 100% 100%  Weight:      Height:        Intake/Output Summary (Last 24 hours) at 12/02/2021 1040 Last data filed at 12/02/2021 0500 Gross per 24 hour  Intake --  Output 2400 ml  Net -2400 ml   Filed Weights   11/16/21 0500 11/17/21 0408 11/18/21 0423  Weight: 82.8 kg 77.3 kg 73.7 kg    Examination:  Constitutional: NAD Eyes: no scleral icterus ENMT: Mucous membranes are moist.  Trach in place Neck: normal, supple Respiratory: Coarse breath sounds bilaterally Cardiovascular: Regular rate and rhythm, no murmurs / rubs / gallops. No LE edema.  Abdomen: non distended, no  tenderness. Bowel sounds positive.  Musculoskeletal: no clubbing / cyanosis.  Skin: no new rashes Neurologic: Grossly nonfocal  Data Reviewed: I have independently reviewed following labs and imaging studies   CBC: Recent Labs  Lab 11/27/21 0831  WBC 9.4  NEUTROABS 5.0  HGB 10.8*  HCT 33.2*  MCV 88.3  PLT 938   Basic Metabolic Panel: No results for input(s): NA, K, CL, CO2, GLUCOSE, BUN, CREATININE, CALCIUM, MG, PHOS in the last 168 hours. Liver Function Tests: No results for input(s): AST, ALT, ALKPHOS, BILITOT, PROT, ALBUMIN in the last 168 hours. Coagulation Profile: No results for input(s): INR, PROTIME in the last 168 hours. HbA1C: No results for input(s): HGBA1C in the last 72 hours. CBG: Recent Labs  Lab 12/01/21 1113 12/01/21 2047 12/02/21 0024 12/02/21 0349 12/02/21 0744  GLUCAP 186* 151* 116* 148* 154*    Recent Results (from the past 240 hour(s))  Culture, Respiratory w Gram Stain     Status: None   Collection Time: 11/23/21 11:50 AM   Specimen: Tracheal Aspirate; Respiratory  Result Value Ref Range Status    Specimen Description TRACHEAL ASPIRATE  Final   Special Requests NONE  Final   Gram Stain   Final    WBC PRESENT,BOTH PMN AND MONONUCLEAR RARE GRAM POSITIVE COCCI Performed at [facility], [place] [address] [place], [place] [address]    Culture FEW STAPHYLOCOCCUS AUREUS  Final   Report Status 11/26/2021 FINAL  Final   Organism ID, Bacteria STAPHYLOCOCCUS AUREUS  Final      Susceptibility   Staphylococcus aureus - MIC*    CIPROFLOXACIN <=0.5 SENSITIVE Sensitive     ERYTHROMYCIN >=8 RESISTANT Resistant     GENTAMICIN <=0.5 SENSITIVE Sensitive     OXACILLIN 0.5 SENSITIVE Sensitive     TETRACYCLINE <=1 SENSITIVE Sensitive     VANCOMYCIN 1 SENSITIVE Sensitive     TRIMETH/SULFA <=10 SENSITIVE Sensitive     CLINDAMYCIN RESISTANT Resistant     RIFAMPIN <=0.5 SENSITIVE Sensitive     Inducible Clindamycin POSITIVE Resistant     * FEW STAPHYLOCOCCUS AUREUS  Culture, blood (Routine X 2) w Reflex to ID Panel     Status: None   Collection Time: 11/26/21 10:10 AM   Specimen: BLOOD RIGHT HAND  Result Value Ref Range Status   Specimen Description BLOOD RIGHT HAND  Final   Special Requests   Final    BOTTLES DRAWN AEROBIC AND ANAEROBIC Blood Culture adequate volume   Culture   Final    NO GROWTH 5 DAYS Performed at [facility], [address] [place], [place] [address]    Report Status 12/01/2021 FINAL  Final  Culture, blood (Routine X 2) w Reflex to ID Panel     Status: None   Collection Time: 11/26/21 10:25 AM   Specimen: BLOOD RIGHT HAND  Result Value Ref Range Status   Specimen Description BLOOD RIGHT HAND  Final   Special Requests   Final    BOTTLES DRAWN AEROBIC AND ANAEROBIC Blood Culture adequate volume   Culture   Final    NO GROWTH 5 DAYS Performed at [facility], [place] [address], [place],  [address]    Report Status 12/01/2021 FINAL  Final     Radiology Studies: No results found.   Marzetta Board, MD, PhD [facility]  Between 7 am - 7  pm I am available, please contact me via Amion (for emergencies) or Securechat (non urgent messages)  Between 7 pm -  7 am I am not available, please contact night coverage MD/APP via Amion

## 2021-12-02 NOTE — Plan of Care (Signed)
  Problem: Activity: Goal: Risk for activity intolerance will decrease Outcome: Progressing   Problem: Nutrition: Goal: Adequate nutrition will be maintained Outcome: Progressing   Problem: Coping: Goal: Level of anxiety will decrease Outcome: Progressing   

## 2021-12-03 DIAGNOSIS — J9602 Acute respiratory failure with hypercapnia: Secondary | ICD-10-CM | POA: Diagnosis not present

## 2021-12-03 DIAGNOSIS — J9601 Acute respiratory failure with hypoxia: Secondary | ICD-10-CM | POA: Diagnosis not present

## 2021-12-03 DIAGNOSIS — G9340 Encephalopathy, unspecified: Secondary | ICD-10-CM | POA: Diagnosis not present

## 2021-12-03 LAB — GLUCOSE, CAPILLARY
Glucose-Capillary: 98 mg/dL (ref 70–99)
Glucose-Capillary: 135 mg/dL — ABNORMAL HIGH (ref 70–99)
Glucose-Capillary: 226 mg/dL — ABNORMAL HIGH (ref 70–99)
Glucose-Capillary: 102 mg/dL — ABNORMAL HIGH (ref 70–99)
Glucose-Capillary: 160 mg/dL — ABNORMAL HIGH (ref 70–99)
Glucose-Capillary: 119 mg/dL — ABNORMAL HIGH (ref 70–99)

## 2021-12-03 LAB — COMPREHENSIVE METABOLIC PANEL
ALT: 150 U/L — ABNORMAL HIGH (ref 0–44)
AST: 82 U/L — ABNORMAL HIGH (ref 15–41)
Albumin: 2.6 g/dL — ABNORMAL LOW (ref 3.5–5.0)
Alkaline Phosphatase: 242 U/L — ABNORMAL HIGH (ref 38–126)
Anion gap: 10 (ref 5–15)
BUN: 21 mg/dL (ref 8–23)
CO2: 22 mmol/L (ref 22–32)
Calcium: 9.2 mg/dL (ref 8.9–10.3)
Chloride: 97 mmol/L — ABNORMAL LOW (ref 98–111)
Creatinine, Ser: 0.79 mg/dL (ref 0.61–1.24)
GFR, Estimated: >60 mL/min (ref >60)
Glucose, Bld: 151 mg/dL — ABNORMAL HIGH (ref 70–99)
Potassium: 4.3 mmol/L (ref 3.5–5.1)
Sodium: 129 mmol/L — ABNORMAL LOW (ref 135–145)
Total Bilirubin: 0.1 mg/dL — ABNORMAL LOW (ref 0.3–1.2)
Total Protein: 8.4 g/dL — ABNORMAL HIGH (ref 6.5–8.1)

## 2021-12-03 LAB — CBC
HCT: 34.1 % — ABNORMAL LOW (ref 39.0–52.0)
Hemoglobin: 11.1 g/dL — ABNORMAL LOW (ref 13.0–17.0)
MCH: 28 pg (ref 26.0–34.0)
MCHC: 32.6 g/dL (ref 30.0–36.0)
MCV: 86.1 fL (ref 80.0–100.0)
Platelets: 265 10*3/uL (ref 150–400)
RBC: 3.96 MIL/uL — ABNORMAL LOW (ref 4.22–5.81)
RDW: 16.1 % — ABNORMAL HIGH (ref 11.5–15.5)
WBC: 4.6 10*3/uL (ref 4.0–10.5)
nRBC: 0 % (ref 0.0–0.2)

## 2021-12-03 MED ORDER — FREE WATER
250.0000 mL | Status: DC
  Administered 2021-12-03 – 2021-12-04 (×5): 250 mL

## 2021-12-03 NOTE — Progress Notes (Signed)
PROGRESS NOTE  Jeffrey Mullen BDZ:[id number] DOB: 1951/06/05 DOA: 10/23/2021 PCP: [name], NP   LOS: 41 days   Brief Narrative / Interim history: 70 y/o M with past medical history of hypertension, glaucoma who presented to [facility] ER on 10/31 via EMS with reports of altered mental status.  Given  right sided gaze, altered and left sided weakness, code stoke called. The patient was taken emergently to CT for imaging which was negative for LVO.  While in the [place], he had a witnessed seizure.  PCCM called for ICU admission. Chart review shows he was seen in the ER on 10/4 after passing out at the [employer].  Physical exam was negative at the time.  Labs negative.  No further imaging obtained.   Subjective / 24h Interval events: Continues to cough  Assessment & Plan: Principal Problem Acute multifocal bilateral embolic stroke -TTE: No embolic source was identified.  TEE was attempted but could not be performed successfully. LDL 66.  HbA1c 6.0.  Continue aspirin and statin. Noted to be quite deconditioned.  PT and OT.   Active problems Status epilepticus -Resolved.   Continue Keppra and lacosamide.     Acute metabolic encephalopathy -Encephalopathy is likely secondary to all of his acute issues including epilepsy and stroke and acute respiratory failure.  Overall improving, alert, able to communicate clearly   Sepsis likely secondary to tracheitis -Trach cultures were positive for MSSA and completed all antibiotics by 11/28. 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  Stable   Dysphagia -12/9 reevaluated by speech therapy and now recommending regular solids with thin liquids to be in place with meds crushed in pure. Continue nocturnal tube feedings via Cortrak left intake at least 50% for most meals  over the weekend likely can remove core track to   Left cephalic vein superficial thrombophlebitis/left arm extravasation injury -Status post phentolamine and nitroglycerin application.  Continue local wound /hydrotherapy noting significant improvement in appearance of the wound as of 12/7  Hypernatremia/high volume stool output -2/2 free H20 deficit .  Hyponatremic this morning sodium 129.  Decrease free water volume   Physical deconditioning -Finally aware of how debilitated he is. Rec is for LTAC vs trach capable SNF   Essential hypertension/grade 2 diastolic dysfunction -Continue amlodipine, carvedilol. Blood pressure controlled patient remains  Anemia of critical illness -Hemoglobin 10.7 on 11/28   Hypokalemia -Resolved   Alcoholic liver cirrhosis -PCP notes document history of alcohol abuse and known cirrhosis.  RUQ ultrasound c/w cirrhosis.  Unremarkable appearance of gallbladder and biliary tree was noted.  LFTs elevated but overall stable  Acute kidney injury due to contrast-induced ATN -Resolved.    Nontraumatic rhabdomyolysis -Resolved   Goals of care -Palliative medicine has been following.  They had been communicating with patient's former significant other.  Apparently this person requested that she no longer wanted to be the surrogate decision maker.  Previously his biological daughter has also mentioned that she does not want to be the primary decision maker.  Social worker to help conduct search for alternative family members/next of kin. After discussions with critical care medicine and with two-physician documentation patient's CODE STATUS was changed to DNR.  See note from 11/27.  Scheduled Meds:  amLODipine  10 mg Per Tube Daily   aspirin  81 mg Per Tube Daily  atorvastatin  40 mg Per Tube Daily   carvedilol  12.5 mg Per Tube BID WC   chlorhexidine  15 mL Mouth Rinse BID   collagenase   Topical BID   dorzolamide-timolol  1 drop Right Eye BID   feeding supplement   237 mL Oral BID BM   feeding supplement (JEVITY 1.5 CAL/FIBER)  900 mL Per Tube Q24H   feeding supplement (PROSource TF)  45 mL Per Tube QID   fiber  1 packet Per Tube BID   folic acid  1 mg Per Tube Daily   free water  300 mL Per Tube Q4H   heparin  5,000 Units Subcutaneous Q8H   insulin aspart  0-15 Units Subcutaneous Q4H   lacosamide  200 mg Per Tube BID   latanoprost  1 drop Right Eye QHS   levETIRAcetam  500 mg Per Tube BID   loperamide HCl  1 mg Per Tube Daily   mouth rinse  15 mL Mouth Rinse q12n4p   multivitamin with minerals  1 tablet Per Tube Daily   nicotine  14 mg Transdermal Daily   nutrition supplement (JUVEN)  1 packet Per Tube BID BM   pantoprazole sodium  40 mg Per Tube QHS   saccharomyces boulardii  250 mg Per Tube BID   scopolamine  1 patch Transdermal Q72H   sodium chloride flush  10-40 mL Intracatheter Q12H   sulfamethoxazole-trimethoprim  20 mL Oral Q12H   thiamine  100 mg Per Tube Daily   Continuous Infusions:  sodium chloride Stopped (11/07/21 0016)   sodium chloride 5 mL/hr at 11/19/21 0949   PRN Meds:.sodium chloride, acetaminophen (TYLENOL) oral liquid 160 mg/5 mL, docusate, fentaNYL (SUBLIMAZE) injection, guaiFENesin, hydrALAZINE, ipratropium-albuterol, labetalol, loperamide HCl, polyethylene glycol, sodium chloride flush  Diet Orders (From admission, onward)     Start     Ordered   12/01/21 1014  Diet regular Room service appropriate? Yes; Fluid consistency: Thin  Diet effective now       Question Answer Comment  Room service appropriate? Yes   Fluid consistency: Thin      12/01/21 1014            DVT prophylaxis: heparin injection 5,000 Units Start: 10/28/21 1400 SCDs Start: 10/23/21 1639     Code Status: DNR  Family Communication: no family at bedside   Status is: Inpatient  Remains inpatient appropriate because: trach, placement  Level of care: Progressive  Consultants:  PCCM  Objective: Vitals:   12/03/21 0300 12/03/21  0340 12/03/21 0735 12/03/21 0800  BP: 139/70 (!) 141/77 123/61   Pulse: 81 81 81 80  Resp: 20 14 18 15   Temp:  98.9 F (37.2 C) 98 F (36.7 C)   TempSrc:  Oral    SpO2: 100% 100% 100% 100%  Weight:      Height:        Intake/Output Summary (Last 24 hours) at 12/03/2021 1030 Last data filed at 12/02/2021 1556 Gross per 24 hour  Intake --  Output 1175 ml  Net -1175 ml    Filed Weights   11/16/21 0500 11/17/21 0408 11/18/21 0423  Weight: 82.8 kg 77.3 kg 73.7 kg    Examination:  Constitutional: NAD Eyes: Anicteric ENMT: Moist mucous membranes.  Trach in place Neck: normal, supple Respiratory: Coarse breath sounds bilaterally Cardiovascular: Regular rate and rhythm, no murmurs, no peripheral edema Abdomen: Soft, NT, ND, bowel sounds positive Musculoskeletal: no clubbing / cyanosis.  Skin: No rashes seen Neurologic: No focal deficits  Data Reviewed: I have independently reviewed following labs and imaging studies   CBC: Recent Labs  Lab 11/27/21 0831 12/03/21 0237  WBC 9.4 4.6  NEUTROABS 5.0  --   HGB 10.8* 11.1*  HCT 33.2* 34.1*  MCV 88.3 86.1  PLT 242 644    Basic Metabolic Panel: Recent Labs  Lab 12/03/21 0237  NA 129*  K 4.3  CL 97*  CO2 22  GLUCOSE 151*  BUN 21  CREATININE 0.79  CALCIUM 9.2   Liver Function Tests: Recent Labs  Lab 12/03/21 0237  AST 82*  ALT 150*  ALKPHOS 242*  BILITOT 0.1*  PROT 8.4*  ALBUMIN 2.6*   Coagulation Profile: No results for input(s): INR, PROTIME in the last 168 hours. HbA1C: No results for input(s): HGBA1C in the last 72 hours. CBG: Recent Labs  Lab 12/02/21 1529 12/02/21 2012 12/02/21 2320 12/03/21 0337 12/03/21 0753  GLUCAP 127* 119* 150* 98 135*     Recent Results (from the past 240 hour(s))  Culture, Respiratory w Gram Stain     Status: None   Collection Time: 11/23/21 11:50 AM   Specimen: Tracheal Aspirate; Respiratory  Result Value Ref Range Status   Specimen Description TRACHEAL  ASPIRATE  Final   Special Requests NONE  Final   Gram Stain   Final    WBC PRESENT,BOTH PMN AND MONONUCLEAR RARE GRAM POSITIVE COCCI Performed at [facility], [place] [address], [place], [place] [address]    Culture FEW STAPHYLOCOCCUS AUREUS  Final   Report Status 11/26/2021 FINAL  Final   Organism ID, Bacteria STAPHYLOCOCCUS AUREUS  Final      Susceptibility   Staphylococcus aureus - MIC*    CIPROFLOXACIN <=0.5 SENSITIVE Sensitive     ERYTHROMYCIN >=8 RESISTANT Resistant     GENTAMICIN <=0.5 SENSITIVE Sensitive     OXACILLIN 0.5 SENSITIVE Sensitive     TETRACYCLINE <=1 SENSITIVE Sensitive     VANCOMYCIN 1 SENSITIVE Sensitive     TRIMETH/SULFA <=10 SENSITIVE Sensitive     CLINDAMYCIN RESISTANT Resistant     RIFAMPIN <=0.5 SENSITIVE Sensitive     Inducible Clindamycin POSITIVE Resistant     * FEW STAPHYLOCOCCUS AUREUS  Culture, blood (Routine X 2) w Reflex to ID Panel     Status: None   Collection Time: 11/26/21 10:10 AM   Specimen: BLOOD RIGHT HAND  Result Value Ref Range Status   Specimen Description BLOOD RIGHT HAND  Final   Special Requests   Final    BOTTLES DRAWN AEROBIC AND ANAEROBIC Blood Culture adequate volume   Culture   Final    NO GROWTH 5 DAYS Performed at [facility], [address], [place], [place] [address]    Report Status 12/01/2021 FINAL  Final  Culture, blood (Routine X 2) w Reflex to ID Panel     Status: None   Collection Time: 11/26/21 10:25 AM   Specimen: BLOOD RIGHT HAND  Result Value Ref Range Status   Specimen Description BLOOD RIGHT HAND  Final   Special Requests   Final    BOTTLES DRAWN AEROBIC AND ANAEROBIC Blood Culture adequate volume   Culture   Final    NO GROWTH 5 DAYS Performed at [facility], [place] [address] [place], [place] [address]    Report Status 12/01/2021 FINAL  Final      Radiology Studies: No results found.   Marzetta Board, MD, PhD [facility]  Between 7 am - 7 pm I am  available, please  contact me via Amion (for emergencies) or Securechat (non urgent messages)  Between 7 pm - 7 am I am not available, please contact night coverage MD/APP via Amion

## 2021-12-03 NOTE — Plan of Care (Signed)
  Problem: Clinical Measurements: Goal: Will remain free from infection Outcome: Progressing Goal: Respiratory complications will improve Outcome: Progressing Goal: Cardiovascular complication will be avoided Outcome: Progressing   

## 2021-12-04 DIAGNOSIS — T8089XD Other complications following infusion, transfusion and therapeutic injection, subsequent encounter: Secondary | ICD-10-CM | POA: Diagnosis not present

## 2021-12-04 DIAGNOSIS — Z430 Encounter for attention to tracheostomy: Secondary | ICD-10-CM

## 2021-12-04 DIAGNOSIS — G40901 Epilepsy, unspecified, not intractable, with status epilepticus: Secondary | ICD-10-CM | POA: Diagnosis not present

## 2021-12-04 DIAGNOSIS — Z930 Tracheostomy status: Secondary | ICD-10-CM | POA: Diagnosis not present

## 2021-12-04 DIAGNOSIS — R7401 Elevation of levels of liver transaminase levels: Secondary | ICD-10-CM

## 2021-12-04 DIAGNOSIS — I639 Cerebral infarction, unspecified: Secondary | ICD-10-CM | POA: Diagnosis not present

## 2021-12-04 LAB — GLUCOSE, CAPILLARY
Glucose-Capillary: 94 mg/dL (ref 70–99)
Glucose-Capillary: 145 mg/dL — ABNORMAL HIGH (ref 70–99)
Glucose-Capillary: 86 mg/dL (ref 70–99)
Glucose-Capillary: 141 mg/dL — ABNORMAL HIGH (ref 70–99)
Glucose-Capillary: 109 mg/dL — ABNORMAL HIGH (ref 70–99)
Glucose-Capillary: 124 mg/dL — ABNORMAL HIGH (ref 70–99)
Glucose-Capillary: 99 mg/dL (ref 70–99)

## 2021-12-04 MED ORDER — ADULT MULTIVITAMIN W/MINERALS CH
1.0000 | ORAL_TABLET | Freq: Every day | ORAL | Status: DC
  Administered 2021-12-05 – 2021-12-14 (×10): 1 via ORAL
  Filled 2021-12-04 (×10): qty 1

## 2021-12-04 MED ORDER — BOOST / RESOURCE BREEZE PO LIQD CUSTOM
1.0000 | Freq: Three times a day (TID) | ORAL | Status: DC
  Administered 2021-12-04 – 2021-12-14 (×22): 1 via ORAL

## 2021-12-04 MED ORDER — JUVEN PO PACK
1.0000 | PACK | Freq: Two times a day (BID) | ORAL | Status: DC
  Administered 2021-12-05: 1 via ORAL
  Filled 2021-12-04: qty 1

## 2021-12-04 NOTE — Progress Notes (Signed)
SLP Cancellation Note  Patient Details Name: Jeffrey Mullen MRN: [id number] DOB: 18-Apr-1951   Cancelled treatment:       Reason Eval/Treat Not Completed: Patient at procedure or test/unavailable  Unable to assess diet tolerance and continue PMSV education, as pt is currently with PT. Will continue efforts.  Umeka Wrench B. Quentin Ore, [facility], [name]  Shonna Chock 12/04/2021, 11:19 AM

## 2021-12-04 NOTE — Progress Notes (Signed)
Nutrition Follow-up  DOCUMENTATION CODES:   Not applicable  INTERVENTION:  -d/c calorie count -d/c Cortrak and TF orders per MD -d/c vital cuisine shakes -d/c Ensure -Boost Breeze po TID, each supplement provides 250 kcal and 9 grams of protein  NUTRITION DIAGNOSIS:   Inadequate oral intake related to inability to eat as evidenced by NPO status.  Progressing, pt on regular diet with thin liquids  GOAL:   Patient will meet greater than or equal to 90% of their needs  progressing  MONITOR:   PO intake, Supplement acceptance, Diet advancement, Skin, TF tolerance, Weight trends, Labs, I & O's  REASON FOR ASSESSMENT:   Ventilator    ASSESSMENT:   Pt with PMH of hepatitis C, alcohol abuse and seizures admitted with with status epilepticus.  11/11 cortrak placed 11/15 cortrak removed 11/16 cortrak replaced (gastric tip per xray) 12/09 diet advanced to regular with thin liquids   Pt alert with PMSV in place.   RD went to follow-up on remaining half of calorie count. Only 1 meal ticket in envelope which stated 100% meal completion. No other meal documentation available nor documentation regarding supplement completion. NP would like Cortrak to be removed today; pt is extremely happy with this news. RD to monitor for adequacy of intake and to provide oral nutrition supplementation. Pt has orders for Ensure but, per RN, has been consistently refusing. Will trial boost breeze.   UOP: 2x unmeasured occurrences x24 hours I/O: -10,04ml since admit  Medications:  amLODipine  10 mg Per Tube Daily   aspirin  81 mg Per Tube Daily   carvedilol  12.5 mg Per Tube BID WC   chlorhexidine  15 mL Mouth Rinse BID   collagenase   Topical BID   dorzolamide-timolol  1 drop Right Eye BID   feeding supplement  237 mL Oral BID BM   fiber  1 packet Per Tube BID   folic acid  1 mg Per Tube Daily   heparin  5,000 Units Subcutaneous Q8H   insulin aspart  0-15 Units Subcutaneous Q4H    lacosamide  200 mg Per Tube BID   latanoprost  1 drop Right Eye QHS   levETIRAcetam  500 mg Per Tube BID   loperamide HCl  1 mg Per Tube Daily   mouth rinse  15 mL Mouth Rinse q12n4p   multivitamin with minerals  1 tablet Per Tube Daily   nicotine  14 mg Transdermal Daily   nutrition supplement (JUVEN)  1 packet Per Tube BID BM   pantoprazole sodium  40 mg Per Tube QHS   saccharomyces boulardii  250 mg Per Tube BID   scopolamine  1 patch Transdermal Q72H   sodium chloride flush  10-40 mL Intracatheter Q12H   thiamine  100 mg Per Tube Daily   Labs: Recent Labs  Lab 12/03/21 0237  NA 129*  K 4.3  CL 97*  CO2 22  BUN 21  CREATININE 0.79  CALCIUM 9.2  GLUCOSE 151*  CBGs: 86-160 x24 hours   Diet Order:   Diet Order             Diet regular Room service appropriate? Yes; Fluid consistency: Thin  Diet effective now                   EDUCATION NEEDS:   Not appropriate for education at this time  Skin:  Skin Assessment: Skin Integrity Issues: Skin Integrity Issues:: Stage II, DTI, Unstageable, Other (Comment) DTI: L heel Stage  II: R heel, L elbow, anus Unstageable: L/R buttocks Other: skin tears to penis, back, arm  Last BM:  12/11  Height:   Ht Readings from Last 1 Encounters:  11/02/21 5\' 10"  (1.778 m)    Weight:   Wt Readings from Last 1 Encounters:  11/18/21 73.7 kg    Ideal Body Weight:  75.5 kg  BMI:  Body mass index is 23.31 kg/m.  Estimated Nutritional Needs:   Kcal:  2100-2300  Protein:  120-140 grams  Fluid:  > 2 /day     Theone Stanley., MS, RD, LDN (she/her/hers) RD pager number and weekend/on-call pager number located in [place].

## 2021-12-04 NOTE — Progress Notes (Signed)
[facility] PROGRESS NOTE  Jeffrey Mullen FWY:[id number] DOB: [birth year] DOA: 10/23/2021 PCP: [name], NP  Status: Remains inpatient appropriate because:  Unsafe discharge plan; currently not medically stable  Barriers to discharge: Social: Patient unable to independently make decisions regarding medical care-guardianship process pending  Clinical: Core track in place, trach tube in place, significantly impaired mobility secondary to right hemiparesis with therapies rec LTAC  Level of care:  Progressive   Code Status: DNR Family Communication: Has no family available to assist.  Girlfriend also unable to assist with patient after discharge. DVT prophylaxis: Subcutaneous heparin COVID vaccination status: 02/18/2020, 04/25/2020 and 11/11/2020  HPI: 70 y/o M with past medical history of hypertension, glaucoma who presented to [facility] ER on 10/31 via EMS with reports of altered mental status.  Given  right sided gaze, altered and left sided weakness, code stoke called. The patient was taken emergently to CT for imaging which was negative for LVO.  While in the [place], he had a witnessed seizure.  PCCM called for ICU admission. Chart review shows he was seen in the ER on 10/4 after passing out at the [employer].  Physical exam was negative at the time.  Labs negative.  No further imaging obtained.    Subjective: Alert.  PMSV in place.  Patient able to verbalize easily without any respiratory symptoms.  When informed that feeding tube would be removed today he was very thankful to have this out.  He wants the pulse oximetry removed from his ear  Objective: Vitals:   12/04/21 0358 12/04/21 0733  BP:    Pulse:  78  Resp:  20  Temp: 98.1 F (36.7 C)   SpO2:     No intake or output data in the 24 hours ending 12/04/21 0821  Filed Weights   11/16/21 0500 11/17/21 0408 11/18/21 0423  Weight: 82.8 kg 77.3 kg 73.7 kg    Exam:  Constitutional: NAD,  calm Respiratory: 4.0 cuffless trach trach 5 L, trach site unremarkable, lung sounds are coarse but clear, normal respiratory effort Cardiovascular: Regular rate, S1-S2.  No extremity edema.  Normotensive Abdomen: Soft and nontender with normoactive bowel sounds.  Cortrack tube.  Rectal tube in place with nonwatery brown stool Skin: Left arm wound covered with dressing which is clean dry and intact Neurologic: CN 2-12 grossly intact. Sensation intact, Strength 3-4/5 on left, marked decrease strength on right: Arm is 2/5 and leg is 3/5 Psychiatric:  alert and appropriately interactive.  Able to vocalize and follow simple commands-oriented to name and place  Assessment/Plan: Acute problems: Acute multifocal bilateral embolic stroke TTE: No embolic source was identified.  TEE was attempted but could not be performed successfully. LDL 66.  HbA1c 6.0.  Continue aspirin and statin Noted to be quite deconditioned.  PT and OT.   Status epilepticus Resolved.   Continue Keppra and lacosamide.     Acute metabolic encephalopathy Resolved   Sepsis likely secondary to tracheitis Trach cultures were positive for MSSA and completed all antibiotics by 11/28 Repeat sputum culture on 12 /1 with few MSSA resistant to inducible clindamycin, erythromycin and clindamycin-Since fevers are low-grade Bactrim initiated but dc'd on D # 6 (12/12) 2/2 transaminitis  Tracheostomy dependent Has been transitioned to a 4.0 cuffless trach and if continues to improve plan is to place PMSV continuously with subsequent capping trials and hopeful decannulation 7  Dysphagia Resolved Now on regular diet with thin liquid Remove cortrack tube 27/74   Left cephalic vein  superficial thrombophlebitis/left arm extravasation injury Status post phentolamine and nitroglycerin application.   Continue local wound /hydrotherapy noting significant improvement in appearance of the wound as of 12/7      10/23/2021                   11/20/2021     11/29/2021  Hypernatremia/high volume stool output 2/2 free H20 deficit   As of 12/3 sodium had improved to 144-follow-up on 12/10 reveals sodium now low at 129 with only minimally elevated glucose of 151 12/12 feeding tube removed so can discontinue free water flushes Repeat electrolyte panel by the end of the week post discontinuation of free water flushes  Physical deconditioning Finally aware of how debilitated he is Rec is for LTAC vs SNF (hopefully will be decannulated soon which will allow for more SNF bed choices)   Essential hypertension/grade 2 diastolic dysfunction Continue amlodipine, carvedilol Blood pressure controlled patient remains  Anemia of critical illness Hemoglobin 10.7 on 11/28   Hypokalemia Resolved with potassium 4.2 as of 40/34   Alcoholic liver cirrhosis PCP notes document history of alcohol abuse and known cirrhosis LFTS variable but typically normal to only mildly elevated w/ normal TB Past two readings AST 70-80 range and ALT in the 150s- will dc statin and dc Bactrim Follow LFTs  Acute kidney injury due to contrast-induced ATN Resolved.   Current creatinine 0.86  Nontraumatic rhabdomyolysis Resolved   Goals of care Palliative medicine has been following.  They had been communicating with patient's former significant other.  Apparently this person requested that she no longer wanted to be the surrogate decision maker.  Previously his biological daughter has also mentioned that she does not want to be the primary decision maker.  Social worker to help conduct search for alternative family members/next of kin. After discussions with critical care medicine and with two-physician documentation patient's CODE STATUS was changed to DNR.  See note from 11/27.    Data Reviewed: Basic Metabolic Panel: Recent Labs  Lab 12/03/21 0237  NA 129*  K 4.3  CL 97*  CO2 22  GLUCOSE 151*  BUN 21  CREATININE 0.79  CALCIUM 9.2   Liver  Function Tests: Recent Labs  Lab 12/03/21 0237  AST 82*  ALT 150*  ALKPHOS 242*  BILITOT 0.1*  PROT 8.4*  ALBUMIN 2.6*    CBC: Recent Labs  Lab 11/27/21 0831 12/03/21 0237  WBC 9.4 4.6  NEUTROABS 5.0  --   HGB 10.8* 11.1*  HCT 33.2* 34.1*  MCV 88.3 86.1  PLT 242 265    CBG: Recent Labs  Lab 12/03/21 1209 12/03/21 1619 12/03/21 1955 12/03/21 2323 12/04/21 0359  GLUCAP 226* 102* 160* 119* 145*    Recent Results (from the past 240 hour(s))  Culture, blood (Routine X 2) w Reflex to ID Panel     Status: None   Collection Time: 11/26/21 10:10 AM   Specimen: BLOOD RIGHT HAND  Result Value Ref Range Status   Specimen Description BLOOD RIGHT HAND  Final   Special Requests   Final    BOTTLES DRAWN AEROBIC AND ANAEROBIC Blood Culture adequate volume   Culture   Final    NO GROWTH 5 DAYS Performed at [facility], [place] [address], [place],  [address]    Report Status 12/01/2021 FINAL  Final  Culture, blood (Routine X 2) w Reflex to ID Panel     Status: None   Collection Time: 11/26/21 10:25 AM  Specimen: BLOOD RIGHT HAND  Result Value Ref Range Status   Specimen Description BLOOD RIGHT HAND  Final   Special Requests   Final    BOTTLES DRAWN AEROBIC AND ANAEROBIC Blood Culture adequate volume   Culture   Final    NO GROWTH 5 DAYS Performed at [facility], [address], [place], [place] [address]    Report Status 12/01/2021 FINAL  Final      Scheduled Meds:  amLODipine  10 mg Per Tube Daily   aspirin  81 mg Per Tube Daily   carvedilol  12.5 mg Per Tube BID WC   chlorhexidine  15 mL Mouth Rinse BID   collagenase   Topical BID   dorzolamide-timolol  1 drop Right Eye BID   feeding supplement  237 mL Oral BID BM   feeding supplement (JEVITY 1.5 CAL/FIBER)  900 mL Per Tube Q24H   feeding supplement (PROSource TF)  45 mL Per Tube QID   fiber  1 packet Per Tube BID   folic acid  1 mg Per Tube Daily   free water  250 mL Per Tube Q4H    heparin  5,000 Units Subcutaneous Q8H   insulin aspart  0-15 Units Subcutaneous Q4H   lacosamide  200 mg Per Tube BID   latanoprost  1 drop Right Eye QHS   levETIRAcetam  500 mg Per Tube BID   loperamide HCl  1 mg Per Tube Daily   mouth rinse  15 mL Mouth Rinse q12n4p   multivitamin with minerals  1 tablet Per Tube Daily   nicotine  14 mg Transdermal Daily   nutrition supplement (JUVEN)  1 packet Per Tube BID BM   pantoprazole sodium  40 mg Per Tube QHS   saccharomyces boulardii  250 mg Per Tube BID   scopolamine  1 patch Transdermal Q72H   sodium chloride flush  10-40 mL Intracatheter Q12H   thiamine  100 mg Per Tube Daily   Continuous Infusions:  sodium chloride Stopped (11/07/21 0016)   sodium chloride 5 mL/hr at 11/19/21 0949    Active Problems:   Status epilepticus ([place])   Acute respiratory failure with hypercapnia (HCC)   Acute encephalopathy   Cryptogenic stroke (HCC)   Pressure injury of skin   AKI (acute kidney injury) ([place])   Seizure ([place])   Acute respiratory failure with hypoxia ([place])   Status post tracheostomy ([place])   Hypokalemia   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 ([place])   Oropharyngeal dysphagia   Consultants: Neurology PCCM Nephrology  Procedures: EEG Echocardiogram a bubble study Core track Tracheostomy Lumbar puncture in interventional radiology  Antibiotics: Acyclovir 11/1 through 11/4 Azithromycin 11/4 through 11/7 Ceftriaxone 11/4 through 11/7 Linezolid 11/4 through 11/5 Unasyn 11/8 through 11/11 Ceftriaxone 11/11 through 11/12 Zosyn 11/17 through 11/19 Cefazolin 11/19 through 11/21 Zosyn 11/22 through 11/28 Vancomycin 11/22 through 11/28   Time spent: 30 minutes    Erin Hearing ANP  [facility] 7 am - 330 pm/M-F for direct patient care and secure chat Please refer to Amion for contact info 42  days

## 2021-12-04 NOTE — Progress Notes (Signed)
Physical Therapy Wound Treatment Patient Details  Name: Jeffrey Mullen MRN: [id number] Date of Birth: 09-02-1951  Today's Date: 12/04/2021 Time: 1106-1130 Time Calculation (min): 24 min  Subjective  Subjective Assessment Subjective: When I here that motor, I know it's on... Patient and Family Stated Goals: I hope this (wound) will heal up nice. Date of Onset:  (unknown) Prior Treatments: dressing changes  Pain Score:  5-7/10  Found to not be premedicated, asked RN for fentanyl  Wound Assessment     Wound / Incision (Open or Dehisced) 10/27/21 Skin tear Arm Left;Lower;Posterior torn blisters with open skin (Active)  Wound Image   11/29/21 1212  Dressing Type Foam - Lift dressing to assess site every shift;Gauze (Comment);Impregnated gauze (bismuth);Moist to dry;Normal saline moist dressing;Santyl 12/04/21 1138  Dressing Changed Changed 12/04/21 1138  Dressing Status Clean;Dry;Intact 12/04/21 1138  Dressing Change Frequency Daily 12/04/21 1138  Site / Wound Assessment Red;Yellow 12/04/21 1138  % Wound base Red or Granulating 90% 12/04/21 1138  % Wound base Yellow/Fibrinous Exudate 10% 12/04/21 1138  % Wound base Black/Eschar 0% 12/04/21 1138  % Wound base Other/Granulation Tissue (Comment) 0% 12/04/21 1138  Peri-wound Assessment Intact;Erythema (blanchable);Pink 12/04/21 1138  Wound Length (cm) 15 cm 11/29/21 1212  Wound Width (cm) 8 cm 11/29/21 1212  Wound Depth (cm) 0 cm 11/29/21 1212  Wound Volume (cm^3) 0 cm^3 11/29/21 1212  Wound Surface Area (cm^2) 120 cm^2 11/29/21 1212  Margins Unattached edges (unapproximated) 12/04/21 1138  Closure None 12/01/21 0800  Drainage Amount Minimal 12/04/21 1138  Drainage Description Serous 12/04/21 1138  Treatment Cleansed;Debridement (Selective);Hydrotherapy (Pulse lavage);Packing (Saline gauze) 12/04/21 1138      Hydrotherapy Pulsed lavage therapy - wound location: L dorsal forearm Pulsed Lavage with Suction (psi): 8 psi  (4-12) Pulsed Lavage with Suction - Normal Saline Used: 1000 mL Pulsed Lavage Tip: Tip with splash shield Selective Debridement Selective Debridement - Location: left dorsal forearm Selective Debridement - Tools Used: Forceps, Scissors Selective Debridement - Tissue Removed: yellow slough    Wound Assessment and Plan  Wound Therapy - Assess/Plan/Recommendations Wound Therapy - Clinical Statement: Pt about to meet goals, now will make sure that he doesn't get hypergranulation at the site. Wound Therapy - Functional Problem List: significant debilitation and low mobility Factors Delaying/Impairing Wound Healing: Altered sensation, Immobility, Substance abuse, Tobacco use Hydrotherapy Plan: Debridement, Dressing change, Patient/family education, Pulsatile lavage with suction Wound Therapy - Frequency: 3X / week Wound Therapy - Current Recommendations: WOC nurse Wound Therapy - Follow Up Recommendations: dressing changes by RN, Other (comment)  Wound Therapy Goals- Improve the function of patient's integumentary system by progressing the wound(s) through the phases of wound healing (inflammation - proliferation - remodeling) by: Wound Therapy Goals - Improve the function of patient's integumentary system by progressing the wound(s) through the phases of wound healing by: Decrease Necrotic Tissue to: 0 Decrease Necrotic Tissue - Progress: Goal set today Increase Granulation Tissue to: 100 Increase Granulation Tissue - Progress: Goal set today Goals/treatment plan/discharge plan were made with and agreed upon by patient/family: No, Patient unable to participate in goals/treatment/discharge plan and family unavailable Time For Goal Achievement: 7 days Wound Therapy - Potential for Goals: Good  Goals will be updated until maximal potential achieved or discharge criteria met.  Discharge criteria: when goals achieved, discharge from hospital, MD decision/surgical intervention, no progress towards  goals, refusal/missing three consecutive treatments without notification or medical reason.  GP     Charges PT Wound Care Charges $Wound Debridement up to  20 cm: < or equal to 20 cm $PT Hydrotherapy Dressing: 1 dressing $PT PLS Gun and Tip: 1 Supply $PT Hydrotherapy Visit: 1 Visit       Kenneth V Mottinger 12/04/2021, 11:46 AM 12/04/2021  Ken M., PT Acute Rehabilitation Services [phone]  (pager) [phone]  (office)  

## 2021-12-04 NOTE — Progress Notes (Signed)
PT Cancellation Note  Patient Details Name: Jeffrey Mullen MRN: [id number] DOB: 08/26/1951   Cancelled Treatment:    Reason Eval/Treat Not Completed: (P) Patient at procedure or test/unavailable (pt having hydrotherapy) Will continue efforts per PT plan of care as schedule permits.    Kara Pacer Robbi Spells 12/04/2021, 4:24 PM *delayed entry

## 2021-12-04 NOTE — TOC Progression Note (Addendum)
Transition of Care [facility]) - Progression Note    Patient Details  Name: Jeffrey Mullen MRN: [id number] Date of Birth: 04-26-1951  Transition of Care [facility]) CM/SW [place], RN Phone Number: 12/04/2021, 2:11 PM  Clinical Narrative:    CM and MSW with DTP Team is continuing to follow the patient for [facility] needs - including SNF placement.  The patient is currently not medically ready for discharge but continues to make improvements - including transition to #4 cuffless trach with likely plan for capping trials and decannulation.  The patient's Cortrak was removed today and speech is continuing to work with the patient with progression with ordered diet.  Titus Mould, APS SW continues to reach out to family for needed guardianship for SNF placement.  12/04/2021 1417 - CM spoke with Prince Rome, APS SW on the phone and she states that she sent a letter to the patient's brother last Thursday, 11/30/2021 requesting brother to take initiative to be the patient's guardian but has not received communication back in return.  Laurance Flatten, APS SW states that if she does not hear back from the family - that she will file petition for interim guardianship so that we can find SNF placement for the patient and have DSS follow accordingly. I updated her on the patient progress and that he continues to need guardianship since he is only oriented to person and place at this time.  CM and MSW with DTP Team will continue to follow the patient for needed SNF placement and likely DSS interim guardianship and court date pending.   Expected Discharge Plan: [place] Barriers to Discharge: Continued Medical Work up  Expected Discharge Plan and Services Expected Discharge Plan: [place]   Discharge Planning Services: CM Consult Post Acute Care Choice: [place] Living arrangements for the past 2 months: Apartment                                        Social Determinants of Health (SDOH) Interventions    Readmission Risk Interventions No flowsheet data found.

## 2021-12-04 NOTE — Progress Notes (Addendum)
NAME:  Jeffrey Mullen, MRN:  [id number], DOB:  1951/03/12, LOS: 38 ADMISSION DATE:  10/23/2021, CONSULTATION DATE:  10/31 REFERRING MD:  Dr. Dina Rich, CHIEF COMPLAINT:  AMS   History of Present Illness:  70 y/o M who presented to [facility] ER on 10/31 via EMS with reports of altered mental status.  Given  right sided gaze, altered and left sided weakness, code stoke called. The patient was taken emergently to CT for imaging which was negative for LVO.  While in the [place], he had a witnessed seizure.    PCCM called for ICU admission.   Chart review shows he was seen in the ER on 10/4 after passing out at the [employer].  Physical exam was negative at the time.  Labs negative.  No further imaging obtained.   Pertinent  Medical History  Alcohol Use - unclear quantity Anxiety / Depression  Congenital blindness of left eye, cataract on R Hepatitis C+, treated, better in 2016 Seizures   Significant Hospital Events: Including procedures, antibiotic start and stop dates in addition to other pertinent events   10/31 Admit with AMS, seizure, hypertensive emergency, AKI 11/1 MRI brain no acute intracranial process. No etiology is seen for the patient's seizure. 11/02 bedside LP attempted but unsuccessful  11/03  No seizures seen on EEG overnight  11/04 weaning sedation to assess for seizure reoccurrence  11/9 bronchoscopy with noted thick secretions in LLL 11/10 Left PICC placed 11/11 Trach,  Cor Track placed, New Embolic Strokes per MRI 42/87 Post Trach Day 1, some oozing from site, Echo results noted below 11/13 Low grade fever, tachy, LFT's remain elevated with ALT and Alk Phos continuing to rise.  S/p lasix  11/14 weaned PSV 10/5, no changes, foley removed 11/15 on ATC, TEE attempted- unable to pass probe, briefly on Neo, cortrak removed fort TEE, tmax 102.3 11/17 afebrile, NG on cultures, continues to have copious secretions 11/20 fever trend is down, on Ancef 11/22: only tolerating  PSV trials for 3 hours a day; broadened antibiotics to zosyn/vanc due to fever spike; recultured 12/9 MBS. Solid pharyngeal residue clears with a liquid wash, thin residue is mild except for one instance of sensed aspiration with thin and pill just before and after (due to backflow) the swallow. Pt had PMSV in place during testing and vocal quality and cough strength adequate to protect airway. He did have frequent coughing throughout testing not related to aspiration. Regular solids;Thin liquid.  12/9 changed trach to 6 cuffless. Starting capping trials    Interim History / Subjective:  No acute events over the weekend. Tolerating PMV well.  Objective   Blood pressure 136/71, pulse 71, temperature 98 F (36.7 C), temperature source Oral, resp. rate 15, height '5\' 10"'  (1.778 m), weight 73.7 kg, SpO2 100 %.    FiO2 (%):  [21 %] 21 %  No intake or output data in the 24 hours ending 12/04/21 1706  Physical Exam: General 70 year old male. He is resting in bed. Not in any distress HENT NCAT no JVD has 6 uncuffed. Able to phonate w/ PMV in place.  Pulm clear to auscultation Card rrr Abd soft  Neuro awake alert. Follows commands still hse right sided weakness.   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   -his mental status is improving. He is now on a diet. His  speech quality is good and is tolerating PMV.  Changed to cuffless trach on 12/9.  Plan Will initiate capping trials tomorrow 12/13   Freda Jackson, MD [facility] Office: [phone]   See Amion for personal pager PCCM on call pager [phone] until 7pm. Please call Elink 7p-7a. [phone]

## 2021-12-05 LAB — GLUCOSE, CAPILLARY
Glucose-Capillary: 139 mg/dL — ABNORMAL HIGH (ref 70–99)
Glucose-Capillary: 152 mg/dL — ABNORMAL HIGH (ref 70–99)
Glucose-Capillary: 152 mg/dL — ABNORMAL HIGH (ref 70–99)
Glucose-Capillary: 162 mg/dL — ABNORMAL HIGH (ref 70–99)
Glucose-Capillary: 98 mg/dL (ref 70–99)

## 2021-12-05 MED ORDER — LACOSAMIDE 200 MG PO TABS
200.0000 mg | ORAL_TABLET | Freq: Two times a day (BID) | ORAL | Status: DC
  Administered 2021-12-05 – 2021-12-14 (×19): 200 mg via ORAL
  Filled 2021-12-05 (×20): qty 1

## 2021-12-05 MED ORDER — LEVETIRACETAM 500 MG PO TABS
500.0000 mg | ORAL_TABLET | Freq: Two times a day (BID) | ORAL | Status: DC
  Administered 2021-12-05 – 2021-12-06 (×3): 500 mg via ORAL
  Filled 2021-12-05 (×3): qty 1

## 2021-12-05 MED ORDER — CARVEDILOL 12.5 MG PO TABS
12.5000 mg | ORAL_TABLET | Freq: Two times a day (BID) | ORAL | Status: DC
  Administered 2021-12-05 – 2021-12-14 (×18): 12.5 mg via ORAL
  Filled 2021-12-05 (×19): qty 1

## 2021-12-05 MED ORDER — LOPERAMIDE HCL 2 MG PO CAPS: 4.0000 mg | ORAL_CAPSULE | ORAL | Status: DC | PRN

## 2021-12-05 MED ORDER — SACCHAROMYCES BOULARDII 250 MG PO CAPS
250.0000 mg | ORAL_CAPSULE | Freq: Two times a day (BID) | ORAL | Status: DC
  Administered 2021-12-05 – 2021-12-14 (×19): 250 mg via ORAL
  Filled 2021-12-05 (×19): qty 1

## 2021-12-05 MED ORDER — AMLODIPINE BESYLATE 10 MG PO TABS
10.0000 mg | ORAL_TABLET | Freq: Every day | ORAL | Status: DC
  Administered 2021-12-05 – 2021-12-14 (×10): 10 mg via ORAL
  Filled 2021-12-05 (×10): qty 1

## 2021-12-05 MED ORDER — DOCUSATE SODIUM 100 MG PO CAPS: 100.0000 mg | ORAL_CAPSULE | Freq: Every day | ORAL | Status: DC | PRN

## 2021-12-05 MED ORDER — NUTRISOURCE FIBER PO PACK
1.0000 | PACK | Freq: Two times a day (BID) | ORAL | Status: DC
  Administered 2021-12-05 – 2021-12-14 (×18): 1 via ORAL
  Filled 2021-12-05 (×20): qty 1

## 2021-12-05 MED ORDER — ACETAMINOPHEN 325 MG PO TABS
650.0000 mg | ORAL_TABLET | ORAL | Status: DC | PRN
  Administered 2021-12-06 – 2021-12-14 (×15): 650 mg via ORAL
  Filled 2021-12-05 (×16): qty 2

## 2021-12-05 MED ORDER — FOLIC ACID 1 MG PO TABS
1.0000 mg | ORAL_TABLET | Freq: Every day | ORAL | Status: DC
  Administered 2021-12-05 – 2021-12-14 (×10): 1 mg via ORAL
  Filled 2021-12-05 (×10): qty 1

## 2021-12-05 MED ORDER — PANTOPRAZOLE SODIUM 40 MG PO TBEC
40.0000 mg | DELAYED_RELEASE_TABLET | Freq: Every day | ORAL | Status: DC
  Administered 2021-12-05 – 2021-12-14 (×10): 40 mg via ORAL
  Filled 2021-12-05 (×10): qty 1

## 2021-12-05 MED ORDER — THIAMINE HCL 100 MG PO TABS
100.0000 mg | ORAL_TABLET | Freq: Every day | ORAL | Status: DC
  Administered 2021-12-05 – 2021-12-14 (×10): 100 mg via ORAL
  Filled 2021-12-05 (×10): qty 1

## 2021-12-05 MED ORDER — ASPIRIN 81 MG PO CHEW
81.0000 mg | CHEWABLE_TABLET | Freq: Every day | ORAL | Status: DC
  Administered 2021-12-05 – 2021-12-14 (×10): 81 mg via ORAL
  Filled 2021-12-05 (×10): qty 1

## 2021-12-05 NOTE — Progress Notes (Addendum)
Physical Therapy Treatment Patient Details Name: Jeffrey Mullen MRN: [id number] DOB: 04/27/1951 Today's Date: 12/05/2021   History of Present Illness 70 y/o male presented to ED on 10/23/21 after bystanders witnessed patient collapse with associated seizure. Another seizure witnessed in ED. CT head negative. CT C-spine negative. LTM EEG on 11/1 showed 2 seizures from R anterior temporal region. Last seizure noted on EEG on 11/3. Intubated 10/31-11/11. Trach placed 11/11. 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 PMH: alcohol abuse, congenital blindness in L eye, bilateral glaucoma, depression, anxiety, HTN, seizures    PT Comments    Pt received in supine, agreeable to therapy session and with good participation and tolerance for transfer training. Pt diaphoretic with standing today, noted to be orthostatic and BLE quick to fatigue so limited to stand pivot transfers for safety, but this is an improvement as he was totalA for pivot transfers using Holt prior. Pt needing up to modA for physical and RW management during step pivot transfers and stand>sit due to fatigue/decreased eccentric control to sit. Pt seen in conjunction with OT due to multidisciplinary therapy needs, decreased activity tolerance/strength needing +2 physical assist and multiple lines/leads. Pt continues to benefit from PT services to progress toward functional mobility goals. Disposition and DME updated below after discussion with supervising PT Ashly C. per pt progress.   Orthostatic BPs Sitting EOB 144/82 (98)  Sitting after 3 min  137/78 (97)  Standing (pt diaphoretic) 118/78 (91)  Sitting in recliner 134/82 (97)     Recommendations for follow up therapy are one component of a multi-disciplinary discharge planning process, led by the attending physician.  Recommendations may be updated based on  patient status, additional functional criteria and insurance authorization.  Follow Up Recommendations  Other (comment) (Long term SNF with PT services)     Assistance Recommended at Discharge Frequent or constant Supervision/Assistance  Equipment Recommendations  Rolling walker (2 wheels);BSC/3in1;Other (comment) (may benefit from wheelchair/cushion)    Recommendations for Other Services Other (comment) (monitor for tolerance for STAR program or consider AIR if able to reach modI status.)     Precautions / Restrictions Precautions Precautions: Fall Precaution Comments: trach (capped as of 12/13), cortrak, condom cath, L eye blindness Restrictions Weight Bearing Restrictions: No     Mobility  Bed Mobility Overal bed mobility: Needs Assistance Bed Mobility: Rolling;Sidelying to Sit Rolling: Min assist Sidelying to sit: Max assist;HOB elevated;+2 for safety/equipment       General bed mobility comments: required assistance wtih BLEs and trunk but able to assist with raising trunk using UE/bed rail    Transfers Overall transfer level: Needs assistance Equipment used: Rolling walker (2 wheels) Transfers: Sit to/from Stand;Bed to chair/wheelchair/BSC Sit to Stand: Mod assist;From elevated surface;+2 physical assistance     Step pivot transfers: Mod assist;+2 physical assistance;+2 safety/equipment     General transfer comment: progressed from stedy to RW for transfer into recliner, needs max cues for step sequencing and manual assist to manage RW; diaphoretic with standing tasks but able to stand x3 trials from various surfaces (bed, recliner)    Ambulation/Gait             Pre-gait activities: standing BLE marching, needs mod/maxA for lateral weight shift to offload opposite LE, does better on RLE than on LLE     Chief Strategy Officer  Modified Rankin (Stroke Patients Only) Modified Rankin (Stroke Patients Only) Pre-Morbid Rankin  Score: No symptoms Modified Rankin: Severe disability     Balance Overall balance assessment: Needs assistance Sitting-balance support: Feet supported;Bilateral upper extremity supported Sitting balance-Leahy Scale: Poor Sitting balance - Comments: mod assist due to posterior leaning Postural control: Left lateral lean;Posterior lean Standing balance support: Bilateral upper extremity supported Standing balance-Leahy Scale: Poor Standing balance comment: reliant on RW for balance, dizzy/diaphoretic                            Cognition Arousal/Alertness: Awake/alert Behavior During Therapy: WFL for tasks assessed/performed (somewhat flat) Overall Cognitive Status: No family/caregiver present to determine baseline cognitive functioning Area of Impairment: Attention;Memory;Awareness;Safety/judgement;Following commands;Problem solving                 Orientation Level: Disoriented to;Time;Situation Current Attention Level: Sustained Memory: Decreased short-term memory;Decreased recall of precautions Following Commands: Follows one step commands consistently;Follows one step commands with increased time Safety/Judgement: Decreased awareness of safety;Decreased awareness of deficits Awareness: Intellectual Problem Solving: Slow processing;Difficulty sequencing;Requires verbal cues;Requires tactile cues General Comments: increased time to follow directions, pt with decreased recall of safety/hand placement cues from one transfer to another        Exercises General Exercises - Lower Extremity Ankle Circles/Pumps: AROM;Both;10 reps;Supine Hip ABduction/ADduction: AAROM;Both;Supine (reclined in chair, 1 rep ea for teachback) Hip Flexion/Marching: AROM;AAROM;Both;5 reps;Seated    General Comments General comments (skin integrity, edema, etc.): dizzy and diaphoretic standing, see BP in comments above; instruction on pressure relief strategy/frequency in chair and bed, pt  able to return demo technique in chair      Pertinent Vitals/Pain Pain Assessment: Faces Faces Pain Scale: Hurts little more Pain Location: neck pain Pain Descriptors / Indicators: Discomfort;Grimacing Pain Intervention(s): Monitored during session;Repositioned    Home Living                          Prior Function            PT Goals (current goals can now be found in the care plan section) Acute Rehab PT Goals Patient Stated Goal: to get stronger and walk again PT Goal Formulation: With patient Time For Goal Achievement: 12/11/21 (goals updated by [facility] on 12/5) Progress towards PT goals: Progressing toward goals (goals updated by supervising PT on 12/5)    Frequency    Min 3X/week      PT Plan Discharge plan needs to be updated    Co-evaluation PT/OT/SLP Co-Evaluation/Treatment: Yes Reason for Co-Treatment: Complexity of the patient's impairments (multi-system involvement);Necessary to address cognition/behavior during functional activity;For patient/therapist safety;To address functional/ADL transfers PT goals addressed during session: Mobility/safety with mobility;Balance;Proper use of DME;Strengthening/ROM OT goals addressed during session: ADL's and self-care      AM-PAC PT "6 Clicks" Mobility   Outcome Measure  Help needed turning from your back to your side while in a flat bed without using bedrails?: A Little Help needed moving from lying on your back to sitting on the side of a flat bed without using bedrails?: A Lot Help needed moving to and from a bed to a chair (including a wheelchair)?: A Lot Help needed standing up from a chair using your arms (e.g., wheelchair or bedside chair)?: A Lot Help needed to walk in hospital room?: Total Help needed climbing 3-5 steps with a railing? : Total 6 Click Score: 11    End of  Session Equipment Utilized During Treatment: Gait belt Activity Tolerance: Patient tolerated treatment well Patient left: in  chair;with call bell/phone within reach;with chair alarm set;Other (comment) (heels floated, pt refusing prevalon boots) Nurse Communication: Mobility status PT Visit Diagnosis: Other abnormalities of gait and mobility (R26.89);Other symptoms and signs involving the nervous system (R29.898);Muscle weakness (generalized) (M62.81)     Time: 8280-0349 PT Time Calculation (min) (ACUTE ONLY): 26 min  Charges:  $Therapeutic Activity: 8-22 mins                     Yarethzi Branan P., PTA Acute Rehabilitation Services Pager: [phone] Office: [place] 12/05/2021, 2:24 PM

## 2021-12-05 NOTE — Progress Notes (Signed)
Occupational Therapy Treatment Patient Details Name: Jeffrey Mullen MRN: [id number] DOB: 1951-12-14 Today's Date: 12/05/2021   History of present illness 70 y/o male presented to ED on 10/23/21 after bystanders witnessed patient collapse with associated seizure. Another seizure witnessed in ED. CT head negative. CT C-spine negative. LTM EEG on 11/1 showed 2 seizures from R anterior temporal region. Last seizure noted on EEG on 11/3. Intubated 10/31-11/11. Trach placed 11/11. 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 PMH: alcohol abuse, congenital blindness in L eye, bilateral glaucoma, depression, anxiety, HTN, seizures   OT comments  Patient  received in bed and eager to participate with PT/OT treatment. Patient required max assist to sit EOB and required assistance for sitting balance due to posterior and left lateral leaning. Patient stood from EOB to RW and tolerated weight shifting.  Patient transferred into recliner with RW and +2 assist. Patient making good gains with transfers and therapy participation. Acute OT to continue to follow.    Recommendations for follow up therapy are one component of a multi-disciplinary discharge planning process, led by the attending physician.  Recommendations may be updated based on patient status, additional functional criteria and insurance authorization.    Follow Up Recommendations  Skilled nursing-short term rehab (<3 hours/day)    Assistance Recommended at Discharge Frequent or constant Supervision/Assistance  Equipment Recommendations  Wheelchair (measurements OT);Wheelchair cushion (measurements OT);Hospital bed;Other (comment)    Recommendations for Other Services      Precautions / Restrictions Precautions Precautions: Fall Precaution Comments: trach, cortrak, condom cath       Mobility Bed Mobility Overal bed  mobility: Needs Assistance Bed Mobility: Rolling;Sidelying to Sit Rolling: Min assist Sidelying to sit: Max assist;HOB elevated       General bed mobility comments: required assistance wtih BLEs and trunk    Transfers Overall transfer level: Needs assistance Equipment used: Rolling walker (2 wheels) Transfers: Sit to/from Stand;Bed to chair/wheelchair/BSC Sit to Stand: Mod assist;From elevated surface   Step pivot transfers: Mod assist;+2 physical assistance;+2 safety/equipment       General transfer comment: progressed from stedy to RW for transfer into recliner     Balance Overall balance assessment: Needs assistance Sitting-balance support: Feet supported;Bilateral upper extremity supported Sitting balance-Leahy Scale: Poor Sitting balance - Comments: mod assist due to posterior leaning Postural control: Left lateral lean;Posterior lean Standing balance support: Bilateral upper extremity supported Standing balance-Leahy Scale: Poor Standing balance comment: reliant on RW for balance                           ADL either performed or assessed with clinical judgement   ADL Overall ADL's : Needs assistance/impaired     Grooming: Wash/dry hands;Wash/dry face;Supervision/safety;Sitting Grooming Details (indicate cue type and reason): performed sitting on EOB                               General ADL Comments: washed face and hands seated on EOB and in recliner    Extremity/Trunk Assessment              Vision       Perception     Praxis      Cognition Arousal/Alertness: Awake/alert Behavior During Therapy: Flat affect Overall Cognitive Status: No family/caregiver present to determine baseline cognitive functioning Area of Impairment: Attention;Memory;Awareness;Safety/judgement;Following  commands;Problem solving                 Orientation Level: Disoriented to;Time;Situation Current Attention Level: Sustained Memory:  Decreased short-term memory;Decreased recall of precautions Following Commands: Follows one step commands consistently;Follows one step commands with increased time Safety/Judgement: Decreased awareness of safety;Decreased awareness of deficits Awareness: Intellectual Problem Solving: Slow processing;Difficulty sequencing;Requires verbal cues;Requires tactile cues General Comments: increased time to follow directions          Exercises     Shoulder Instructions       General Comments      Pertinent Vitals/ Pain       Pain Assessment: Faces Faces Pain Scale: Hurts little more Pain Location: neck pain Pain Descriptors / Indicators: Discomfort;Grimacing Pain Intervention(s): Monitored during session;Repositioned  Home Living                                          Prior Functioning/Environment              Frequency  Min 2X/week        Progress Toward Goals  OT Goals(current goals can now be found in the care plan section)  Progress towards OT goals: Progressing toward goals  Acute Rehab OT Goals OT Goal Formulation: Patient unable to participate in goal setting Time For Goal Achievement: 12/12/21 Potential to Achieve Goals: Fair ADL Goals Pt Will Perform Grooming: with min assist;sitting Pt Will Perform Upper Body Bathing: with min assist;sitting Additional ADL Goal #1: Pt will follow 3 step command 25% of session attempts Additional ADL Goal #2: Pt will initate ADL task on L side 50% of attempts Additional ADL Goal #3: Pt will tolerate EOB static sitting min (A) as precursor to adls  Plan Discharge plan remains appropriate;Frequency remains appropriate    Co-evaluation    PT/OT/SLP Co-Evaluation/Treatment: Yes Reason for Co-Treatment: Complexity of the patient's impairments (multi-system involvement);For patient/therapist safety;To address functional/ADL transfers   OT goals addressed during session: ADL's and self-care      AM-PAC  OT "6 Clicks" Daily Activity     Outcome Measure   Help from another person eating meals?: A Lot Help from another person taking care of personal grooming?: A Little Help from another person toileting, which includes using toliet, bedpan, or urinal?: A Lot Help from another person bathing (including washing, rinsing, drying)?: A Lot Help from another person to put on and taking off regular upper body clothing?: A Lot Help from another person to put on and taking off regular lower body clothing?: A Lot 6 Click Score: 13    End of Session Equipment Utilized During Treatment: Gait belt;Rolling walker (2 wheels)  OT Visit Diagnosis: Unsteadiness on feet (R26.81);Muscle weakness (generalized) (M62.81)   Activity Tolerance Patient tolerated treatment well   Patient Left in chair;with call bell/phone within reach;with chair alarm set   Nurse Communication Mobility status        Time: 4481-8563 OT Time Calculation (min): 26 min  Charges: OT General Charges $OT Visit: 1 Visit OT Treatments $Self Care/Home Management : 8-22 mins  Lodema Hong, [place]  Pager [phone] Office [place] 12/05/2021, 1:07 PM

## 2021-12-05 NOTE — Consult Note (Addendum)
[facility] Nurse wound follow up Patient receiving care in [facility] 3W30 Sacral wounds are still in need of the Santyl. I would continue this but with twice daily dressing changes. His heels are looking very good/stable. Refuses to put Prevalon heel lift boots on. States they are too tight on his feet. 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 Hydrotherapy continued for Wednesday and Friday of this week. I cleaned the wound on the arm, placed a piece of Xeroform gauze over the wound, 4 x 4s and wrapped with Kerlix. I do not feel that plastics is needed at this time.   WOC will continue to follow weekly. Please re-consult if needed.  Cathlean Marseilles Tamala Julian, MSN, RN, [place], Lysle Pearl, [facility] Wound Treatment Associate Pager [phone]

## 2021-12-05 NOTE — Progress Notes (Signed)
NAME:  Jeffrey Mullen, MRN:  [id number], DOB:  11-Sep-1951, LOS: 53 ADMISSION DATE:  10/23/2021, CONSULTATION DATE:  10/31 REFERRING MD:  Dr. Dina Rich, CHIEF COMPLAINT:  AMS   History of Present Illness:  70 y/o M who presented to [facility] ER on 10/31 via EMS with reports of altered mental status.  Given  right sided gaze, altered and left sided weakness, code stoke called. The patient was taken emergently to CT for imaging which was negative for LVO.  While in the [place], he had a witnessed seizure.    PCCM called for ICU admission.   Chart review shows he was seen in the ER on 10/4 after passing out at the [employer].  Physical exam was negative at the time.  Labs negative.  No further imaging obtained.   Pertinent  Medical History  Alcohol Use - unclear quantity Anxiety / Depression  Congenital blindness of left eye, cataract on R Hepatitis C+, treated, better in 2016 Seizures   Significant Hospital Events: Including procedures, antibiotic start and stop dates in addition to other pertinent events   10/31 Admit with AMS, seizure, hypertensive emergency, AKI 11/1 MRI brain no acute intracranial process. No etiology is seen for the patient's seizure. 11/02 bedside LP attempted but unsuccessful  11/03  No seizures seen on EEG overnight  11/04 weaning sedation to assess for seizure reoccurrence  11/9 bronchoscopy with noted thick secretions in LLL 11/10 Left PICC placed 11/11 Trach,  Cor Track placed, New Embolic Strokes per MRI 53/97 Post Trach Day 1, some oozing from site, Echo results noted below 11/13 Low grade fever, tachy, LFT's remain elevated with ALT and Alk Phos continuing to rise.  S/p lasix  11/14 weaned PSV 10/5, no changes, foley removed 11/15 on ATC, TEE attempted- unable to pass probe, briefly on Neo, cortrak removed fort TEE, tmax 102.3 11/17 afebrile, NG on cultures, continues to have copious secretions 11/20 fever trend is down, on Ancef 11/22: only tolerating  PSV trials for 3 hours a day; broadened antibiotics to zosyn/vanc due to fever spike; recultured 12/9 MBS. Solid pharyngeal residue clears with a liquid wash, thin residue is mild except for one instance of sensed aspiration with thin and pill just before and after (due to backflow) the swallow. Pt had PMSV in place during testing and vocal quality and cough strength adequate to protect airway. He did have frequent coughing throughout testing not related to aspiration. Regular solids;Thin liquid.  12/9 changed trach to 6 cuffless. Starting capping trials    Interim History / Subjective:   Transitioned to red cap this morning. Tolerating it well.   Objective   Blood pressure 125/74, pulse 84, temperature 99.1 F (37.3 C), temperature source Oral, resp. rate 18, height '5\' 10"'  (1.778 m), weight 73.7 kg, SpO2 100 %.    FiO2 (%):  [21 %] 21 %   Intake/Output Summary (Last 24 hours) at 12/05/2021 1143 Last data filed at 12/05/2021 0636 Gross per 24 hour  Intake 960 ml  Output 2750 ml  Net -1790 ml    Physical Exam: General 70 year old male. He is resting in bed. Not in any distress HENT NCAT no JVD has 6 uncuffed. Able to phonate w/ PMV in place.  Pulm clear to auscultation Card rrr Abd soft  Neuro awake alert. Follows commands still hse right sided weakness.   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   -his mental status is improved. He is now on a diet. His speech quality is good and is tolerating PMV.  Changed to cuffless trach on 12/9. Capping trial started 12/13.  Plan If capping trial goes well, will plan to remove trach in the coming days.   Freda Jackson, MD [facility] Office: [phone]   See Amion for personal pager PCCM on call pager [phone] until 7pm. Please call Elink 7p-7a. [phone]

## 2021-12-05 NOTE — Progress Notes (Signed)
[facility] PROGRESS NOTE  Jeffrey Mullen [id number]:[id number] DOB: 18-Jan-1951 DOA: 10/23/2021 PCP: [name], NP  Status: Remains inpatient appropriate because:  Unsafe discharge plan; currently not medically stable  Barriers to discharge: Social: Patient unable to independently make decisions regarding medical care-guardianship process pending  Clinical: Core track in place, trach tube in place, significantly impaired mobility secondary to right hemiparesis with therapies rec LTAC  Level of care:  Progressive   Code Status: DNR Family Communication: Has no family available to assist.  Girlfriend also unable to assist with patient after discharge. DVT prophylaxis: Subcutaneous heparin COVID vaccination status: 02/18/2020, 04/25/2020 and 11/11/2020  HPI: 70 y/o M with past medical history of hypertension, glaucoma who presented to [facility] ER on 10/31 via EMS with reports of altered mental status.  Given  right sided gaze, altered and left sided weakness, code stoke called. The patient was taken emergently to CT for imaging which was negative for LVO.  While in the [place], he had a witnessed seizure.  PCCM called for ICU admission. Chart review shows he was seen in the ER on 10/4 after passing out at the [employer].  Physical exam was negative at the time.  Labs negative.  No further imaging obtained.    Subjective: No complaints  Objective: Vitals:   12/05/21 0400 12/05/21 0754  BP: 117/69 125/74  Pulse: 79 81  Resp: 19 18  Temp: 98.5 F (36.9 C) 99.1 F (37.3 C)  SpO2: 98% 97%    Intake/Output Summary (Last 24 hours) at 12/05/2021 0808 Last data filed at 12/05/2021 0636 Gross per 24 hour  Intake 1440 ml  Output 2750 ml  Net -1310 ml    Filed Weights   11/16/21 0500 11/17/21 0408 11/18/21 0423  Weight: 82.8 kg 77.3 kg 73.7 kg    Exam:  Constitutional: NAD, calm Respiratory: 4.0 cuffless trach trach 5 L, trach site unremarkable, lung sounds are coarse  but clear, normal respiratory effort Cardiovascular: Regular rate, S1-S2.  No extremity edema.  Normotensive Abdomen: Soft and nontender with normoactive bowel sounds.  Cortrack tube.  Rectal tube in place with nonwatery brown stool Skin: Left arm wound covered with dressing which is clean dry and intact Neurologic: CN 2-12 grossly intact. Sensation intact, Strength 3-4/5 on left, marked decrease strength on right: Arm is 2/5 and leg is 3/5 Psychiatric:  alert and appropriately interactive.  Able to vocalize and follow simple commands-oriented to name and place  Assessment/Plan: Acute problems: Acute multifocal bilateral embolic stroke TTE: No embolic source was identified.  TEE was attempted but could not be performed successfully. LDL 66.  HbA1c 6.0.  Continue aspirin and statin Noted to be quite deconditioned.  PT and OT.   Status epilepticus Resolved.   Continue Keppra and lacosamide.     Acute metabolic encephalopathy Resolved   Sepsis likely secondary to tracheitis Trach cultures were positive for MSSA and completed all antibiotics by 11/28 Repeat sputum culture on 12 /1 with few MSSA resistant to inducible clindamycin, erythromycin and clindamycin-Since fevers are low-grade Bactrim initiated but dc'd on D # 6 (12/12) 2/2 transaminitis  Tracheostomy dependent Has been transitioned to a 4.0 cuffless trach and PCCM starting capping trials 12/13  Dysphagia Resolved Now on regular diet with thin liquid Remove cortrack tube 32/20   Left cephalic vein superficial thrombophlebitis/left arm extravasation injury Status post phentolamine and nitroglycerin application.   Continue local wound /hydrotherapy noting significant improvement in appearance of the wound as of 12/7  10/23/2021                  11/20/2021     11/29/2021  Hypernatremia/high volume stool output 2/2 free H20 deficit   As of 12/3 sodium had improved to 144-follow-up on 12/10 reveals sodium now low at 129  with only minimally elevated glucose of 151 12/12 feeding tube removed so can discontinue free water flushes Repeat electrolyte panel by the end of the week post discontinuation of free water flushes  Physical deconditioning Finally aware of how debilitated he is Rec is for LTAC vs SNF (hopefully will be decannulated soon which will allow for more SNF bed choices)   Essential hypertension/grade 2 diastolic dysfunction Continue amlodipine, carvedilol Blood pressure controlled patient remains  Anemia of critical illness Hemoglobin 10.7 on 11/28   Hypokalemia Resolved with potassium 4.2 as of 73/41   Alcoholic liver cirrhosis PCP notes document history of alcohol abuse and known cirrhosis LFTS variable but typically normal to only mildly elevated w/ normal TB Past two readings AST 70-80 range and ALT in the 150s- will dc statin and dc Bactrim Follow LFTs  Acute kidney injury due to contrast-induced ATN Resolved.   Current creatinine 0.86  Nontraumatic rhabdomyolysis Resolved   Goals of care Palliative medicine has been following.  They had been communicating with patient's former significant other.  Apparently this person requested that she no longer wanted to be the surrogate decision maker.  Previously his biological daughter has also mentioned that she does not want to be the primary decision maker.  Social worker to help conduct search for alternative family members/next of kin. After discussions with critical care medicine and with two-physician documentation patient's CODE STATUS was changed to DNR.  See note from 11/27.    Data Reviewed: Basic Metabolic Panel: Recent Labs  Lab 12/03/21 0237  NA 129*  K 4.3  CL 97*  CO2 22  GLUCOSE 151*  BUN 21  CREATININE 0.79  CALCIUM 9.2   Liver Function Tests: Recent Labs  Lab 12/03/21 0237  AST 82*  ALT 150*  ALKPHOS 242*  BILITOT 0.1*  PROT 8.4*  ALBUMIN 2.6*    CBC: Recent Labs  Lab 12/03/21 0237  WBC 4.6   HGB 11.1*  HCT 34.1*  MCV 86.1  PLT 265    CBG: Recent Labs  Lab 12/04/21 1701 12/04/21 2038 12/04/21 2325 12/05/21 0406 12/05/21 0800  GLUCAP 109* 124* 99 98 139*    Recent Results (from the past 240 hour(s))  Culture, blood (Routine X 2) w Reflex to ID Panel     Status: None   Collection Time: 11/26/21 10:10 AM   Specimen: BLOOD RIGHT HAND  Result Value Ref Range Status   Specimen Description BLOOD RIGHT HAND  Final   Special Requests   Final    BOTTLES DRAWN AEROBIC AND ANAEROBIC Blood Culture adequate volume   Culture   Final    NO GROWTH 5 DAYS Performed at [facility], [place] [address], [place], [place] [address]    Report Status 12/01/2021 FINAL  Final  Culture, blood (Routine X 2) w Reflex to ID Panel     Status: None   Collection Time: 11/26/21 10:25 AM   Specimen: BLOOD RIGHT HAND  Result Value Ref Range Status   Specimen Description BLOOD RIGHT HAND  Final   Special Requests   Final    BOTTLES DRAWN AEROBIC AND ANAEROBIC Blood Culture adequate volume   Culture   Final  NO GROWTH 5 DAYS Performed at [facility], [place] [address] [place], [place] [address]    Report Status 12/01/2021 FINAL  Final      Scheduled Meds:  amLODipine  10 mg Per Tube Daily   aspirin  81 mg Per Tube Daily   carvedilol  12.5 mg Per Tube BID WC   chlorhexidine  15 mL Mouth Rinse BID   collagenase   Topical BID   dorzolamide-timolol  1 drop Right Eye BID   feeding supplement  1 Container Oral TID BM   fiber  1 packet Per Tube BID   folic acid  1 mg Per Tube Daily   heparin  5,000 Units Subcutaneous Q8H   insulin aspart  0-15 Units Subcutaneous Q4H   lacosamide  200 mg Per Tube BID   latanoprost  1 drop Right Eye QHS   levETIRAcetam  500 mg Per Tube BID   loperamide HCl  1 mg Per Tube Daily   mouth rinse  15 mL Mouth Rinse q12n4p   multivitamin with minerals  1 tablet Oral Daily   nicotine  14 mg Transdermal Daily   nutrition supplement (JUVEN)  1 packet  Oral BID WC   pantoprazole sodium  40 mg Per Tube QHS   saccharomyces boulardii  250 mg Per Tube BID   scopolamine  1 patch Transdermal Q72H   sodium chloride flush  10-40 mL Intracatheter Q12H   thiamine  100 mg Per Tube Daily   Continuous Infusions:  sodium chloride Stopped (11/07/21 0016)   sodium chloride 5 mL/hr at 11/19/21 0949    Active Problems:   Status epilepticus ([place])   Acute respiratory failure with hypercapnia (HCC)   Acute encephalopathy   Cryptogenic stroke (HCC)   Pressure injury of skin   AKI (acute kidney injury) ([place])   Seizure ([place])   Acute respiratory failure with hypoxia (HCC)   Status post tracheostomy ([place])   Hypokalemia   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 ([place])   Oropharyngeal dysphagia   Transaminitis   Tracheostomy care [facility])   Consultants: Neurology PCCM Nephrology  Procedures: EEG Echocardiogram a bubble study Core track Tracheostomy Lumbar puncture in interventional radiology  Antibiotics: Acyclovir 11/1 through 11/4 Azithromycin 11/4 through 11/7 Ceftriaxone 11/4 through 11/7 Linezolid 11/4 through 11/5 Unasyn 11/8 through 11/11 Ceftriaxone 11/11 through 11/12 Zosyn 11/17 through 11/19 Cefazolin 11/19 through 11/21 Zosyn 11/22 through 11/28 Vancomycin 11/22 through 11/28   Time spent: 30 minutes    Erin Hearing ANP  [facility] 7 am - 330 pm/M-F for direct patient care and secure chat Please refer to Amion for contact info 43  days

## 2021-12-05 NOTE — Progress Notes (Signed)
Trach capped per CCM order. Patient tolerating well, CCM MD at bedside.

## 2021-12-05 NOTE — Progress Notes (Signed)
[facility] PROGRESS NOTE  TRE SANKER [id number] DOB: 1951/05/27 DOA: 10/23/2021 PCP: [name], NP  Status: Remains inpatient appropriate because:  Unsafe discharge plan; currently not medically stable  Barriers to discharge: Social: Patient unable to independently make decisions regarding medical care-guardianship process pending  Clinical: Core track in place, trach tube in place, significantly impaired mobility secondary to right hemiparesis with therapies rec LTAC  Level of care:  Progressive   Code Status: DNR Family Communication: Has no family available to assist.  Girlfriend also unable to assist with patient after discharge. DVT prophylaxis: Subcutaneous heparin COVID vaccination status: 02/18/2020, 04/25/2020 and 11/11/2020  HPI: 70 y/o M with past medical history of hypertension, glaucoma who presented to [facility] ER on 10/31 via EMS with reports of altered mental status.  Given  right sided gaze, altered and left sided weakness, code stoke called. The patient was taken emergently to CT for imaging which was negative for LVO.  While in the [place], he had a witnessed seizure.  PCCM called for ICU admission. Chart review shows he was seen in the ER on 10/4 after passing out at the [employer].  Physical exam was negative at the time.  Labs negative.  No further imaging obtained.    Subjective: Alert.  Wanting to eat breakfast but having some trouble with object identification.  Patient was assisted to the upright position in the bed and tray was set up for patient.  It is noted that patient attempted to drink coffee with a lid in place.  Lid removed and patient was able to eat and drink otherwise without difficulty  Objective: Vitals:   12/05/21 0400 12/05/21 0754  BP: 117/69 125/74  Pulse: 79 81  Resp: 19 18  Temp: 98.5 F (36.9 C) 99.1 F (37.3 C)  SpO2: 98% 97%    Intake/Output Summary (Last 24 hours) at 12/05/2021 7209 Last data filed at  12/05/2021 0636 Gross per 24 hour  Intake 1200 ml  Output 2750 ml  Net -1550 ml    Filed Weights   11/16/21 0500 11/17/21 0408 11/18/21 0423  Weight: 82.8 kg 77.3 kg 73.7 kg    Exam:  Constitutional: NAD, calm Respiratory: 4.0 cuffless trach trach 5 L, trach site unremarkable, lung sounds are coarse but clear, normal respiratory effort-trach capped today at 11:28 AM Cardiovascular: Regular rate, S1-S2.  No extremity edema.  Normotensive Abdomen: Soft and nontender with normoactive bowel sounds.   Skin: Left arm wound covered with dressing which is clean dry and intact Neurologic: CN 2-12 grossly intact. Sensation intact, Strength 3-4/5 on left, marked decrease strength on right: Arm is 2/5 and leg is 3/5 Psychiatric:  alert and appropriately interactive.  To verbally communicate with PMSV in place.  Has subsequently been changed to Trach cap  Assessment/Plan: Acute problems: Acute multifocal bilateral embolic stroke TTE: No embolic source was identified.  TEE was attempted but could not be performed successfully. LDL 66.  HbA1c 6.0.  Continue aspirin and statin Noted to be quite deconditioned.  PT and OT.   Status epilepticus Resolved.   Continue Keppra and lacosamide.     Acute metabolic encephalopathy Resolved   Sepsis likely secondary to tracheitis Trach cultures were positive for MSSA and completed all antibiotics by 11/28 Repeat sputum culture on 12 /1 with few MSSA resistant to inducible clindamycin, erythromycin and clindamycin-Since fevers are low-grade Bactrim initiated but dc'd on D # 6 (12/12) 2/2 transaminitis  Tracheostomy dependent Capping trials initiated at 11:28 AM  on 12/13  Dysphagia Resolved Now on regular diet with thin liquid Remove cortrack tube 72/53   Left cephalic vein superficial thrombophlebitis/left arm extravasation injury Status post phentolamine and nitroglycerin application.   Continue local wound /hydrotherapy noting significant  improvement in appearance of the wound as of 12/7 -12/13 discussed with wound care RN and PT performing hydrotherapy.  We all agree wound is improving significantly and you days patient will be able to transition to wound care without hydrotherapy.  No indication to consult plastic surgery in regards to evaluation for skin graft      10/23/2021                  11/20/2021     11/29/2021  Hypernatremia/high volume stool output Resolved and actually became mildly hyponatremic Free water has been stopped and core track removed Labs in a.m. Rectal tube discontinued 12/13  Physical deconditioning SNF for rehab   Essential hypertension/grade 2 diastolic dysfunction Continue amlodipine, carvedilol  Anemia of critical illness Hemoglobin 10.7 on 11/28   Hypokalemia Resolved with potassium 4.2 as of 66/44   Alcoholic liver cirrhosis PCP notes document history of alcohol abuse and known cirrhosis LFTS variable but typically normal to only mildly elevated w/ normal TB Past two readings AST 70-80 range and ALT in the 150s Statin and and Bactrim discontinued Follow LFTs  Acute kidney injury due to contrast-induced ATN Resolved.   Current creatinine 0.86  Nontraumatic rhabdomyolysis Resolved   Goals of care Former significant other requested that she no longer wanted to be the surrogate decision maker.  Previously his biological daughter has also mentioned that she does not want to be the primary decision maker.  Social worker to help conduct search for alternative family members/next of kin. After discussions with critical care medicine and with two-physician documentation patient's CODE STATUS was changed to DNR.  See note from 11/27.    Data Reviewed: Basic Metabolic Panel: Recent Labs  Lab 12/03/21 0237  NA 129*  K 4.3  CL 97*  CO2 22  GLUCOSE 151*  BUN 21  CREATININE 0.79  CALCIUM 9.2   Liver Function Tests: Recent Labs  Lab 12/03/21 0237  AST 82*  ALT 150*   ALKPHOS 242*  BILITOT 0.1*  PROT 8.4*  ALBUMIN 2.6*    CBC: Recent Labs  Lab 12/03/21 0237  WBC 4.6  HGB 11.1*  HCT 34.1*  MCV 86.1  PLT 265    CBG: Recent Labs  Lab 12/04/21 1701 12/04/21 2038 12/04/21 2325 12/05/21 0406 12/05/21 0800  GLUCAP 109* 124* 99 98 139*    Recent Results (from the past 240 hour(s))  Culture, blood (Routine X 2) w Reflex to ID Panel     Status: None   Collection Time: 11/26/21 10:10 AM   Specimen: BLOOD RIGHT HAND  Result Value Ref Range Status   Specimen Description BLOOD RIGHT HAND  Final   Special Requests   Final    BOTTLES DRAWN AEROBIC AND ANAEROBIC Blood Culture adequate volume   Culture   Final    NO GROWTH 5 DAYS Performed at [facility], [place] [address] [place], [place] [address]    Report Status 12/01/2021 FINAL  Final  Culture, blood (Routine X 2) w Reflex to ID Panel     Status: None   Collection Time: 11/26/21 10:25 AM   Specimen: BLOOD RIGHT HAND  Result Value Ref Range Status   Specimen Description BLOOD RIGHT HAND  Final   Special Requests  Final    BOTTLES DRAWN AEROBIC AND ANAEROBIC Blood Culture adequate volume   Culture   Final    NO GROWTH 5 DAYS Performed at [facility], [place] [address] [place], [place] [address]    Report Status 12/01/2021 FINAL  Final      Scheduled Meds:  amLODipine  10 mg Oral Daily   aspirin  81 mg Oral Daily   carvedilol  12.5 mg Oral BID WC   chlorhexidine  15 mL Mouth Rinse BID   collagenase   Topical BID   dorzolamide-timolol  1 drop Right Eye BID   feeding supplement  1 Container Oral TID BM   fiber  1 packet Oral BID   folic acid  1 mg Oral Daily   heparin  5,000 Units Subcutaneous Q8H   insulin aspart  0-15 Units Subcutaneous Q4H   lacosamide  200 mg Oral BID   latanoprost  1 drop Right Eye QHS   levETIRAcetam  500 mg Oral BID   mouth rinse  15 mL Mouth Rinse q12n4p   multivitamin with minerals  1 tablet Oral Daily   nicotine  14 mg Transdermal Daily    pantoprazole  40 mg Oral Daily   saccharomyces boulardii  250 mg Oral BID   scopolamine  1 patch Transdermal Q72H   sodium chloride flush  10-40 mL Intracatheter Q12H   thiamine  100 mg Oral Daily   Continuous Infusions:  sodium chloride Stopped (11/07/21 0016)   sodium chloride 5 mL/hr at 11/19/21 3491    Active Problems:   Status epilepticus ([place])   Acute respiratory failure with hypercapnia (HCC)   Acute encephalopathy   Cryptogenic stroke (HCC)   Pressure injury of skin   AKI (acute kidney injury) ([place])   Seizure ([place])   Acute respiratory failure with hypoxia (HCC)   Status post tracheostomy ([place])   Hypokalemia   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 ([place])   Oropharyngeal dysphagia   Transaminitis   Tracheostomy care [facility])   Consultants: Neurology PCCM Nephrology  Procedures: EEG Echocardiogram a bubble study Core track Tracheostomy Lumbar puncture in interventional radiology  Antibiotics: Acyclovir 11/1 through 11/4 Azithromycin 11/4 through 11/7 Ceftriaxone 11/4 through 11/7 Linezolid 11/4 through 11/5 Unasyn 11/8 through 11/11 Ceftriaxone 11/11 through 11/12 Zosyn 11/17 through 11/19 Cefazolin 11/19 through 11/21 Zosyn 11/22 through 11/28 Vancomycin 11/22 through 11/28   Time spent: 30 minutes    Erin Hearing ANP  [facility] 7 am - 330 pm/M-F for direct patient care and secure chat Please refer to Amion for contact info 43  days

## 2021-12-06 ENCOUNTER — Inpatient Hospital Stay (HOSPITAL_COMMUNITY): Payer: Medicare Other

## 2021-12-06 LAB — GLUCOSE, CAPILLARY
Glucose-Capillary: 104 mg/dL — ABNORMAL HIGH (ref 70–99)
Glucose-Capillary: 183 mg/dL — ABNORMAL HIGH (ref 70–99)
Glucose-Capillary: 123 mg/dL — ABNORMAL HIGH (ref 70–99)
Glucose-Capillary: 124 mg/dL — ABNORMAL HIGH (ref 70–99)
Glucose-Capillary: 168 mg/dL — ABNORMAL HIGH (ref 70–99)

## 2021-12-06 MED ORDER — LEVETIRACETAM 100 MG/ML PO SOLN
500.0000 mg | Freq: Two times a day (BID) | ORAL | Status: DC
  Administered 2021-12-06 – 2021-12-14 (×16): 500 mg via ORAL
  Filled 2021-12-06 (×17): qty 5

## 2021-12-06 MED ORDER — ARFORMOTEROL TARTRATE 15 MCG/2ML IN NEBU
15.0000 ug | INHALATION_SOLUTION | Freq: Two times a day (BID) | RESPIRATORY_TRACT | Status: DC
  Administered 2021-12-06 – 2021-12-14 (×17): 15 ug via RESPIRATORY_TRACT
  Filled 2021-12-06 (×17): qty 2

## 2021-12-06 MED ORDER — REVEFENACIN 175 MCG/3ML IN SOLN
175.0000 ug | Freq: Every day | RESPIRATORY_TRACT | Status: DC
  Administered 2021-12-06 – 2021-12-14 (×9): 175 ug via RESPIRATORY_TRACT
  Filled 2021-12-06 (×9): qty 3

## 2021-12-06 NOTE — Progress Notes (Signed)
[facility] PROGRESS NOTE  ANNIE ROSEBOOM XBM:[id number] DOB: May 14, 1951 DOA: 10/23/2021 PCP: [name], NP  Status: Remains inpatient appropriate because:  Unsafe discharge plan; currently not medically stable  Barriers to discharge: Social: Patient unable to independently make decisions regarding medical care-guardianship process pending  Clinical: Core track in place, trach tube in place, significantly impaired mobility secondary to right hemiparesis with therapies rec LTAC  Level of care:  Progressive   Code Status: DNR Family Communication: Has no family available to assist.  Girlfriend also unable to assist with patient after discharge. DVT prophylaxis: Subcutaneous heparin COVID vaccination status: 02/18/2020, 04/25/2020 and 11/11/2020  HPI: 70 y/o M with past medical history of hypertension, glaucoma who presented to [facility] ER on 10/31 via EMS with reports of altered mental status.  Given  right sided gaze, altered and left sided weakness, code stoke called. The patient was taken emergently to CT for imaging which was negative for LVO.  While in the [place], he had a witnessed seizure.  PCCM called for ICU admission. Chart review shows he was seen in the ER on 10/4 after passing out at the [employer].  Physical exam was negative at the time.  Labs negative.  No further imaging obtained.    Subjective: Awake but very confused.  Has PMSV in place and he tells me that he Was removed because he was having trouble breathing.  No complaints verbalized otherwise  Objective: Vitals:   12/06/21 0403 12/06/21 0409  BP:  136/81  Pulse: 84 72  Resp: 18 15  Temp:  97.8 F (36.6 C)  SpO2: 100% 97%    Intake/Output Summary (Last 24 hours) at 12/06/2021 0736 Last data filed at 12/06/2021 0600 Gross per 24 hour  Intake 600 ml  Output 1800 ml  Net -1200 ml    Filed Weights   11/16/21 0500 11/17/21 0408 11/18/21 0423  Weight: 82.8 kg 77.3 kg 73.7 kg     Exam:  Constitutional: NAD, calm Respiratory: 4.0 cuffless trach trach 5 L, trach site unremarkable, lung sounds are coarse but clear, normal respiratory effort-trach capped on 12/13 but this morning patient has PMSV in place. Cardiovascular: Regular rate, S1-S2.  No extremity edema.  Normotensive Abdomen: Soft and nontender with normoactive bowel sounds.   Skin: Left arm wound covered with dressing which is clean dry and intact Neurologic: CN 2-12 grossly intact. Sensation intact, Strength 3-4/5 on left, marked decrease strength on right: Arm is 2/5 and leg is 3/5 Psychiatric:  alert and appropriately interactive.  To verbally communicate with PMSV in place.  Has subsequently been changed to Trach cap  Assessment/Plan: Acute problems: Acute multifocal bilateral embolic stroke TTE: No embolic source was identified.  TEE was attempted but could not be performed successfully. LDL 66.  HbA1c 6.0.  Continue aspirin and statin Noted to be quite deconditioned.  PT and OT.   Status epilepticus Resolved.   Continue Keppra and lacosamide.     Acute metabolic encephalopathy Resolved   Sepsis likely secondary to tracheitis Trach cultures were positive for MSSA and completed all antibiotics by 11/28 Repeat sputum culture on 12 /1 with few MSSA resistant to inducible clindamycin, erythromycin and clindamycin-Since fevers are low-grade Bactrim initiated but dc'd on D # 6 (12/12) 2/2 transaminitis  Tracheostomy dependent Capping trials initiated at 11:28 AM on 12/13 but as of this morning 12/14 PMSV back in place.  No documentation in chart to explain why cath was removed and PMSV placed  Dysphagia Resolved  Now on regular diet with thin liquid Remove cortrack tube 16/10   Left cephalic vein superficial thrombophlebitis/left arm extravasation injury Status post phentolamine and nitroglycerin application.   Continue local wound /hydrotherapy noting significant improvement in appearance of  the wound as of 12/7 -12/13 discussed with wound care RN and PT performing hydrotherapy.  We all agree wound is improving significantly and you days patient will be able to transition to wound care without hydrotherapy.  No indication to consult plastic surgery in regards to evaluation for skin graft      10/23/2021                  11/20/2021     11/29/2021  Hypernatremia/high volume stool output Resolved and actually became mildly hyponatremic Free water has been stopped and core track removed Labs in a.m. Rectal tube discontinued 12/13  Physical deconditioning SNF for rehab   Essential hypertension/grade 2 diastolic dysfunction Continue amlodipine, carvedilol  Anemia of critical illness Hemoglobin 10.7 on 11/28   Hypokalemia Resolved with potassium 4.2 as of 96/04   Alcoholic liver cirrhosis PCP notes document history of alcohol abuse and known cirrhosis LFTS variable but typically normal to only mildly elevated w/ normal TB Past two readings AST 70-80 range and ALT in the 150s Statin and and Bactrim discontinued Follow LFTs  Acute kidney injury due to contrast-induced ATN Resolved.   Current creatinine 0.86  Nontraumatic rhabdomyolysis Resolved   Goals of care Former significant other requested that she no longer wanted to be the surrogate decision maker.  Previously his biological daughter has also mentioned that she does not want to be the primary decision maker.  Social worker to help conduct search for alternative family members/next of kin. After discussions with critical care medicine and with two-physician documentation patient's CODE STATUS was changed to DNR.  See note from 11/27.    Data Reviewed: Basic Metabolic Panel: Recent Labs  Lab 12/03/21 0237  NA 129*  K 4.3  CL 97*  CO2 22  GLUCOSE 151*  BUN 21  CREATININE 0.79  CALCIUM 9.2   Liver Function Tests: Recent Labs  Lab 12/03/21 0237  AST 82*  ALT 150*  ALKPHOS 242*  BILITOT 0.1*   PROT 8.4*  ALBUMIN 2.6*    CBC: Recent Labs  Lab 12/03/21 0237  WBC 4.6  HGB 11.1*  HCT 34.1*  MCV 86.1  PLT 265    CBG: Recent Labs  Lab 12/05/21 0800 12/05/21 1231 12/05/21 1648 12/05/21 2339 12/06/21 0410  GLUCAP 139* 162* 152* 152* 104*    Recent Results (from the past 240 hour(s))  Culture, blood (Routine X 2) w Reflex to ID Panel     Status: None   Collection Time: 11/26/21 10:10 AM   Specimen: BLOOD RIGHT HAND  Result Value Ref Range Status   Specimen Description BLOOD RIGHT HAND  Final   Special Requests   Final    BOTTLES DRAWN AEROBIC AND ANAEROBIC Blood Culture adequate volume   Culture   Final    NO GROWTH 5 DAYS Performed at [facility], [place] [address] [place], [place] [address]    Report Status 12/01/2021 FINAL  Final  Culture, blood (Routine X 2) w Reflex to ID Panel     Status: None   Collection Time: 11/26/21 10:25 AM   Specimen: BLOOD RIGHT HAND  Result Value Ref Range Status   Specimen Description BLOOD RIGHT HAND  Final   Special Requests   Final  BOTTLES DRAWN AEROBIC AND ANAEROBIC Blood Culture adequate volume   Culture   Final    NO GROWTH 5 DAYS Performed at [facility], [place] [address] [place], [place] [address]    Report Status 12/01/2021 FINAL  Final      Scheduled Meds:  amLODipine  10 mg Oral Daily   aspirin  81 mg Oral Daily   carvedilol  12.5 mg Oral BID WC   chlorhexidine  15 mL Mouth Rinse BID   collagenase   Topical BID   dorzolamide-timolol  1 drop Right Eye BID   feeding supplement  1 Container Oral TID BM   fiber  1 packet Oral BID   folic acid  1 mg Oral Daily   heparin  5,000 Units Subcutaneous Q8H   insulin aspart  0-15 Units Subcutaneous Q4H   lacosamide  200 mg Oral BID   latanoprost  1 drop Right Eye QHS   levETIRAcetam  500 mg Oral BID   mouth rinse  15 mL Mouth Rinse q12n4p   multivitamin with minerals  1 tablet Oral Daily   nicotine  14 mg Transdermal Daily   pantoprazole  40 mg  Oral Daily   saccharomyces boulardii  250 mg Oral BID   scopolamine  1 patch Transdermal Q72H   sodium chloride flush  10-40 mL Intracatheter Q12H   thiamine  100 mg Oral Daily   Continuous Infusions:  sodium chloride Stopped (11/07/21 0016)   sodium chloride 5 mL/hr at 11/19/21 7322    Active Problems:   Status epilepticus ([place])   Acute respiratory failure with hypercapnia (HCC)   Acute encephalopathy   Cryptogenic stroke (HCC)   Pressure injury of skin   AKI (acute kidney injury) ([place])   Seizure ([place])   Acute respiratory failure with hypoxia (HCC)   Status post tracheostomy ([place])   Hypokalemia   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 ([place])   Oropharyngeal dysphagia   Transaminitis   Tracheostomy care [facility])   Consultants: Neurology PCCM Nephrology  Procedures: EEG Echocardiogram a bubble study Core track Tracheostomy Lumbar puncture in interventional radiology  Antibiotics: Acyclovir 11/1 through 11/4 Azithromycin 11/4 through 11/7 Ceftriaxone 11/4 through 11/7 Linezolid 11/4 through 11/5 Unasyn 11/8 through 11/11 Ceftriaxone 11/11 through 11/12 Zosyn 11/17 through 11/19 Cefazolin 11/19 through 11/21 Zosyn 11/22 through 11/28 Vancomycin 11/22 through 11/28   Time spent: 30 minutes    Erin Hearing ANP  [facility] 7 am - 330 pm/M-F for direct patient care and secure chat Please refer to Amion for contact info 44  days

## 2021-12-06 NOTE — Progress Notes (Signed)
Patient coughing after a few bites of spaghetti, RT made aware and one of the speech therapist made aware, and will let her coworker know. Patient O2 sats 100% on Room Air.Will continue to monitor.

## 2021-12-06 NOTE — Consult Note (Signed)
[place] Nurse wound follow up Hydrotherapy discontinued on the LFA. We will continue with the following treatment: Clean the LFA gently with NS. Apply Xeroform gauze over the wound, followed by a thin layer of 4 x 4's and wrap with Kerlix. This should be changed twice daily.  Cathlean Marseilles Tamala Julian, MSN, RN, [place], Lysle Pearl, [facility] Wound Treatment Associate Pager [phone]

## 2021-12-06 NOTE — Progress Notes (Signed)
Speech Language Pathology Treatment: Dysphagia;[place] Speaking valve  Patient Details Name: Jeffrey Mullen MRN: [id number] DOB: [birth year] Today's Date: 12/06/2021 Time: 1941-7408 SLP Time Calculation (min) (ACUTE ONLY): 18 min  Assessment / Plan / Recommendation Clinical Impression  Pt seen at bedside for follow up after MBS completed 12/01/2021. Pt was speaking well without valve in place, however, significant tracheal air leakage was bothersome to pt. PMSV was placed to facilitate effective communication. Pt was encouraged to have PMSV in place during meals. He may also use it during waking hours. Pt was educated to remove valve during breathing treatments and when sleeping. Pt was observed drinking thin liquids via straw. He was noted to take very large consecutive boluses, and a delayed cough response was elicited. Review of MBS results indicated pt demonstrated frequent coughing throughout testing not related to aspiration. Pt was educated regarding the importance of upright position during and for 30 minutes after meals due to esophageal issues. SLP will continue to follow to assess diet tolerance, continue education, and educate regarding PMSV donning and doffing.    HPI HPI: 70 y/o male presented to ED on 10/23/21 after bystanders witnessed patient collapse with associated seizure. Another seizure witnessed in ED. CT head negative. CT C-spine negative. LTM EEG on 11/1 showed 2 seizures from R anterior temporal region. Last seizure noted on EEG on 11/3. Intubated 10/31-11/11. Trach placed 11/11. 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  Recommendations may be updated based on patient status, additional functional criteria and insurance authorization.    Recommendations  Diet recommendations: Regular;Thin liquid Liquids provided via: Cup;Straw Medication Administration: Crushed with puree Supervision: Staff to assist with self feeding;Full supervision/cueing for compensatory strategies Compensations: Slow rate;Small sips/bites;Minimize environmental distractions;Follow solids with liquid Postural Changes and/or Swallow Maneuvers: Seated upright 90 degrees;Upright 30-60 min after meal      Patient may use Passy-Muir Speech Valve: During all waking hours (remove during sleep);During PO intake/meals PMSV Supervision: Intermittent MD: Please consider changing trach tube to : Cuffless         Oral Care Recommendations: Oral care BID Follow Up Recommendations: Skilled nursing-short term rehab (<3 hours/day) Assistance recommended at discharge: Frequent or constant Supervision/Assistance SLP Visit Diagnosis: Dysphagia, pharyngoesophageal phase (R13.14) Plan: Continue with current plan of care          Zaevion Parke B. Quentin Ore, [place], [place] Speech Language Pathologist Office: [phone]  Shonna Chock  12/06/2021, 9:27 AM

## 2021-12-06 NOTE — Progress Notes (Signed)
Physical Therapy Treatment Patient Details Name: Jeffrey Mullen MRN: [id number] DOB: Mar 29, 1951 Today's Date: 12/06/2021   History of Present Illness 70 y/o male presented to ED on 10/23/21 after bystanders witnessed patient collapse with associated seizure. Another seizure witnessed in ED. CT head negative. CT C-spine negative. LTM EEG on 11/1 showed 2 seizures from R anterior temporal region. Last seizure noted on EEG on 11/3. Intubated 10/31-11/11. Trach placed 11/11. 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    PT Comments    Pt received in supine, sleeping but easily awoken and agreeable to therapy session with encouragement. Pt c/o fatigue after recent bout of coughing while trying to eat his spaghetti, RN aware. Pt coughing frequently throughout session but reports small sips of ice water seem to help and not notably coughing after taking sips. Emphasis on self-assist for bed mobility, safe body mechanics for sit<>stand transfers from elevated EOB <>RW and standing tolerance/side steps toward HOB. Pt needing up to maxA for bed mobility and up to [facility] for transfers and side steps with RW. Pt participatory despite fatigue and hopeful to participate again tomorrow, stating "I want to do more tomorrow." Plan to perform initial gait trial away from bed tomorrow with chair follow for safety. Pt continues to benefit from PT services to progress toward functional mobility goals.   Recommendations for follow up therapy are one component of a multi-disciplinary discharge planning process, led by the attending physician.  Recommendations may be updated based on patient status, additional functional criteria and insurance authorization.  Follow Up Recommendations  Other (comment)  (Likely will need long term SNF with PT services)     Assistance Recommended at Discharge Frequent or constant Supervision/Assistance  Equipment Recommendations  Rolling walker (2 wheels);BSC/3in1;Other (comment) (may benefit from wheelchair/cushion)    Recommendations for Other Services Other (comment) (monitor for tolerance for STAR program or consider AIR if able to reach modI status.)     Precautions / Restrictions Precautions Precautions: Fall Precaution Comments: trach (capped as of 12/13), condom cath, L eye blindness Restrictions Weight Bearing Restrictions: No     Mobility  Bed Mobility Overal bed mobility: Needs Assistance Bed Mobility: Rolling;Sidelying to Sit;Sit to Sidelying Rolling: Min assist Sidelying to sit: Max assist;HOB elevated;+2 for safety/equipment     Sit to sidelying: Mod assist General bed mobility comments: required heavy assist and multimodal cues to raise trunk and move BLEs but able to assist with lowering trunk using UE/bed rail for return to supine    Transfers Overall transfer level: Needs assistance Equipment used: Rolling walker (2 wheels) Transfers: Sit to/from Stand;Bed to chair/wheelchair/BSC Sit to Stand: Mod assist;From elevated surface           General transfer comment: from elevated bed to RW with heavy ModA, pt able to take 2 sidesteps toward [facility] before fatiguing. Pt reports increased dizziness/fatigue after initial stand requesting to return to supine but reports he wants to do more next session.       Modified Rankin (Stroke Patients Only) Modified Rankin (Stroke Patients Only) Pre-Morbid Rankin Score: No symptoms Modified Rankin: Severe disability     Balance Overall balance assessment: Needs assistance Sitting-balance support: Feet supported;Bilateral upper extremity supported Sitting balance-Leahy Scale: Fair Sitting balance - Comments: able to use BUE for support and supervision/min guard for safety Postural  control: Left lateral lean;Posterior lean Standing balance support: Bilateral upper extremity supported Standing balance-Leahy Scale: Poor Standing balance comment: reliant on RW for balance, dizzy with standing trial        Cognition Arousal/Alertness: Awake/alert Behavior During Therapy: WFL for tasks assessed/performed (somewhat flat) Overall Cognitive Status: No family/caregiver present to determine baseline cognitive functioning Area of Impairment: Attention;Memory;Awareness;Safety/judgement;Following commands;Problem solving       Orientation Level: Disoriented to;Time;Situation Current Attention Level: Sustained Memory: Decreased short-term memory;Decreased recall of precautions Following Commands: Follows one step commands consistently;Follows one step commands with increased time Safety/Judgement: Decreased awareness of safety;Decreased awareness of deficits Awareness: Intellectual Problem Solving: Slow processing;Difficulty sequencing;Requires verbal cues;Requires tactile cues General Comments: Increased time to follow directions, pt with decreased recall of safety/hand placement cues from previous session but participatory as able.        Exercises      General Comments General comments (skin integrity, edema, etc.): SpO2 100% on RA trach capped; HR 103 bpm; pt coughing frequently, per RN he had difficulty swallowing his spaghetti earlier      Pertinent Vitals/Pain Pain Assessment: Faces Faces Pain Scale: Hurts little more Pain Location: neck pain/throat after coughing after eating spaghetti for lunch Pain Descriptors / Indicators: Discomfort;Grimacing Pain Intervention(s): Limited activity within patient's tolerance;Monitored during session;Repositioned           PT Goals (current goals can now be found in the care plan section) Acute Rehab PT Goals Patient Stated Goal: to get stronger and walk again PT Goal Formulation: With patient Time For Goal  Achievement: 12/11/21 (goals updated by [facility] on 12/5) Progress towards PT goals: Progressing toward goals    Frequency    Min 3X/week      PT Plan Discharge plan needs to be updated       AM-PAC PT "6 Clicks" Mobility   Outcome Measure  Help needed turning from your back to your side while in a flat bed without using bedrails?: A Little Help needed moving from lying on your back to sitting on the side of a flat bed without using bedrails?: A Lot Help needed moving to and from a bed to a chair (including a wheelchair)?: A Lot Help needed standing up from a chair using your arms (e.g., wheelchair or bedside chair)?: A Lot Help needed to walk in hospital room?: Total Help needed climbing 3-5 steps with a railing? : Total 6 Click Score: 11    End of Session Equipment Utilized During Treatment: Gait belt Activity Tolerance: Patient tolerated treatment well Patient left: with call bell/phone within reach;Other (comment);in bed (pt refusing prevalon boots) Nurse Communication: Mobility status PT Visit Diagnosis: Other abnormalities of gait and mobility (R26.89);Other symptoms and signs involving the nervous system (R29.898);Muscle weakness (generalized) (M62.81)     Time: 4818-5631 PT Time Calculation (min) (ACUTE ONLY): 19 min  Charges:  $Therapeutic Activity: 8-22 mins                     Faiz Weber P., PTA Acute Rehabilitation Services Pager: [phone] Office: [place] 12/06/2021, 4:52 PM

## 2021-12-06 NOTE — Progress Notes (Signed)
NAME:  Jeffrey Mullen, MRN:  [id number], DOB:  Apr 19, 1951, LOS: 88 ADMISSION DATE:  10/23/2021, CONSULTATION DATE:  10/31 REFERRING MD:  Dr. Dina Rich, CHIEF COMPLAINT:  AMS   History of Present Illness:  69 y/o M who presented to [facility] ER on 10/31 via EMS with reports of altered mental status.  Given  right sided gaze, altered and left sided weakness, code stoke called. The patient was taken emergently to CT for imaging which was negative for LVO.  While in the [place], he had a witnessed seizure.    PCCM called for ICU admission.   Chart review shows he was seen in the ER on 10/4 after passing out at the [employer].  Physical exam was negative at the time.  Labs negative.  No further imaging obtained.   Pertinent  Medical History  Alcohol Use - unclear quantity Anxiety / Depression  Congenital blindness of left eye, cataract on R Hepatitis C+, treated, better in 2016 Seizures   Significant Hospital Events: Including procedures, antibiotic start and stop dates in addition to other pertinent events   10/31 Admit with AMS, seizure, hypertensive emergency, AKI 11/1 MRI brain no acute intracranial process. No etiology is seen for the patient's seizure. 11/02 bedside LP attempted but unsuccessful  11/03  No seizures seen on EEG overnight  11/04 weaning sedation to assess for seizure reoccurrence  11/9 bronchoscopy with noted thick secretions in LLL 11/10 Left PICC placed 11/11 Trach,  Cor Track placed, New Embolic Strokes per MRI 86/76 Post Trach Day 1, some oozing from site, Echo results noted below 11/13 Low grade fever, tachy, LFT's remain elevated with ALT and Alk Phos continuing to rise.  S/p lasix  11/14 weaned PSV 10/5, no changes, foley removed 11/15 on ATC, TEE attempted- unable to pass probe, briefly on Neo, cortrak removed fort TEE, tmax 102.3 11/17 afebrile, NG on cultures, continues to have copious secretions 11/20 fever trend is down, on Ancef 11/22: only tolerating  PSV trials for 3 hours a day; broadened antibiotics to zosyn/vanc due to fever spike; recultured 12/9 MBS. Solid pharyngeal residue clears with a liquid wash, thin residue is mild except for one instance of sensed aspiration with thin and pill just before and after (due to backflow) the swallow. Pt had PMSV in place during testing and vocal quality and cough strength adequate to protect airway. He did have frequent coughing throughout testing not related to aspiration. Regular solids;Thin liquid.  12/9 changed trach to 6 cuffless. Starting capping trials    Interim History / Subjective:   Tolerated cap trial throughout yesterday but PMV put back on due to shortness of breath last evening.   Continues to have cough with mucous production.  Objective   Blood pressure 132/76, pulse 80, temperature 97.8 F (36.6 C), temperature source Oral, resp. rate 18, height _0  (1.778 m), weight 73.7 kg, SpO2 97 %.    FiO2 (%):  [21 %] 21 %   Intake/Output Summary (Last 24 hours) at 12/06/2021 1143 Last data filed at 12/06/2021 0600 Gross per 24 hour  Intake 600 ml  Output 1800 ml  Net -1200 ml    Physical Exam: General 70 year old male. He is resting in bed. Not in any distress HENT NCAT no JVD has 6 uncuffed. Able to phonate w/ PMV in place.  Pulm clear to auscultation Card rrr Abd soft  Neuro awake alert. Follows commands still hse right sided weakness.   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   -his mental status is improved. He is now on a diet. His speech quality is good and is tolerating PMV.  Changed to cuffless trach on 12/9. Capping trial started 12/13.  Plan Continue capping trials.  Add nebulizer treatments for dyspnea and cough Will hopefully decanulate in near future   Freda Jackson, MD [place] Office: [phone]   See Amion for personal pager PCCM on call pager [phone] until 7pm. Please call [place] 7p-7a. [phone]

## 2021-12-06 NOTE — Progress Notes (Addendum)
No charge progress note  Is being actually patient actively followed by Ms. Erin Hearing, NP in the [facility] team.  Chart briefly reviewed.  Subjective  interviewed and examined patient with RN at bedside.  Patient denies complaints.  Appears to be pleasantly confused.  As per RN, overnight his tracheostomy Was dislodged after bouts of coughing.  She plans to suction him shortly.  Otherwise no active events.  He consumed all of his breakfast.  Objective Middle-age male, lying comfortably propped up in bed.  Vital signs stable.  Not hypoxic on room air.  Tracheostomy with PMV valve. RS: Clear to auscultation. Eyes: Corneal opacity of left eye with blindness.  Patient reports from childhood glaucoma. CVS: S1 and S2 heard, RRR.  No JVD, murmurs or pedal edema.  Telemetry personally reviewed: Sinus rhythm. CNS: Alert and oriented x2.  No focal neurological deficits. Extremities: Moves all limbs symmetrically.  Bilateral heel floaters.   Labs CBGs reasonably controlled.  I personally reviewed chest x-ray while radiology tech was in the room on her portable machine and did not see any acute findings.  Lung fields appeared clear.  No recent lab work since 12/11.  Assessment and plan:  Acute embolic stroke Status epilepticus Acute metabolic encephalopathy Tracheostomy dependent  Other medical problems as per Ms. Erin Hearing progress note.  Briefly discussed with her this morning.  Appeared to be waiting on safe disposition, guardianship pending.  Recommend following BMP/LFTs periodically.  Vernell Leep, MD,  FACP, [facility], [facility], [facility] (Care Management Physician Certified) [facility] [place]  To contact the attending provider between 7A-7P or the covering provider during after hours 7P-7A, please log into the web site [email or web address] and access using universal [place] password for that web site. If you do not have the password, please call the hospital  operator.

## 2021-12-06 NOTE — Progress Notes (Signed)
Physical Therapy Wound Treatment Patient Details  Name: Jeffrey Mullen MRN: [id number] Date of Birth: 10/25/1951  Today's Date: 12/06/2021 Time: 5053-9767 Time Calculation (min): 22 min  Subjective  Subjective Assessment Subjective: When I here that motor, I know it's on... Patient and Family Stated Goals: I hope this (wound) will heal up nice. Date of Onset:  (unknown) Prior Treatments: dressing changes  Pain Score:  4/10 premedicated  Wound Assessment  Wound / Incision (Open or Dehisced) 10/27/21 Skin tear Arm Left;Lower;Posterior torn blisters with open skin (Active)  Wound Image   12/06/21 1141  Dressing Type Gauze (Comment);Impregnated gauze (bismuth);Moist to moist;Normal saline moist dressing 12/06/21 1141  Dressing Changed Changed 12/06/21 1141  Dressing Status Clean;Dry;Intact 12/06/21 1141  Dressing Change Frequency Daily 12/06/21 1141  Site / Wound Assessment Red 12/06/21 1141  % Wound base Red or Granulating 100% 12/06/21 1141  % Wound base Yellow/Fibrinous Exudate 0% 12/06/21 1141  % Wound base Black/Eschar 0% 12/06/21 1141  % Wound base Other/Granulation Tissue (Comment) 0% 12/06/21 1141  Peri-wound Assessment Intact 12/06/21 1141  Wound Length (cm) 15 cm 12/06/21 1141  Wound Width (cm) 5.5 cm 12/06/21 1141  Wound Depth (cm) 0 cm 12/06/21 1141  Wound Volume (cm^3) 0 cm^3 12/06/21 1141  Wound Surface Area (cm^2) 82.5 cm^2 12/06/21 1141  Margins Unattached edges (unapproximated) 12/06/21 1141  Closure None 12/01/21 0800  Drainage Amount Scant 12/06/21 1141  Drainage Description Serous 12/06/21 1141  Treatment Cleansed;Debridement (Selective);Hydrotherapy (Pulse lavage);Packing (Saline gauze);Other (Comment) 12/06/21 1141      Hydrotherapy Pulsed lavage therapy - wound location: L dorsal forearm Pulsed Lavage with Suction (psi): 4 psi Pulsed Lavage with Suction - Normal Saline Used: 1000 mL Pulsed Lavage Tip: Tip with splash shield Selective  Debridement Selective Debridement - Location: left dorsal forearm Selective Debridement - Tools Used: Forceps Selective Debridement - Tissue Removed: yellow slough    Wound Assessment and Plan  Wound Therapy - Assess/Plan/Recommendations Wound Therapy - Clinical Statement: Pt has met goals for discontinue, very minimal slough remains that will come off with dressing changes. Wound Therapy - Functional Problem List: significant debilitation and low mobility Factors Delaying/Impairing Wound Healing: Altered sensation, Immobility, Substance abuse, Tobacco use Hydrotherapy Plan: Debridement, Dressing change, Patient/family education, Pulsatile lavage with suction Wound Therapy - Frequency: 3X / week Wound Therapy - Current Recommendations: WOC nurse Wound Therapy - Follow Up Recommendations: dressing changes by RN, Other (comment)  Wound Therapy Goals- Improve the function of patient's integumentary system by progressing the wound(s) through the phases of wound healing (inflammation - proliferation - remodeling) by: Wound Therapy Goals - Improve the function of patient's integumentary system by progressing the wound(s) through the phases of wound healing by: Decrease Necrotic Tissue to: 0 Decrease Necrotic Tissue - Progress: Met Increase Granulation Tissue to: 100 Increase Granulation Tissue - Progress: Met Goals/treatment plan/discharge plan were made with and agreed upon by patient/family: No, Patient unable to participate in goals/treatment/discharge plan and family unavailable Time For Goal Achievement: 7 days Wound Therapy - Potential for Goals: Good  Goals will be updated until maximal potential achieved or discharge criteria met.  Discharge criteria: when goals achieved, discharge from hospital, MD decision/surgical intervention, no progress towards goals, refusal/missing three consecutive treatments without notification or medical reason.  GP     Charges PT Wound Care  Charges $Wound Debridement up to 20 cm: < or equal to 20 cm $PT PLS Gun and Tip: 1 Supply $PT Hydrotherapy Visit: 1 Visit  Jeffrey Mullen Jeffrey Mullen 12/06/2021, 11:49 AM

## 2021-12-07 LAB — GLUCOSE, CAPILLARY
Glucose-Capillary: 101 mg/dL — ABNORMAL HIGH (ref 70–99)
Glucose-Capillary: 106 mg/dL — ABNORMAL HIGH (ref 70–99)
Glucose-Capillary: 113 mg/dL — ABNORMAL HIGH (ref 70–99)
Glucose-Capillary: 116 mg/dL — ABNORMAL HIGH (ref 70–99)
Glucose-Capillary: 117 mg/dL — ABNORMAL HIGH (ref 70–99)
Glucose-Capillary: 119 mg/dL — ABNORMAL HIGH (ref 70–99)
Glucose-Capillary: 120 mg/dL — ABNORMAL HIGH (ref 70–99)
Glucose-Capillary: 158 mg/dL — ABNORMAL HIGH (ref 70–99)
Glucose-Capillary: 190 mg/dL — ABNORMAL HIGH (ref 70–99)

## 2021-12-07 MED ORDER — DOXYCYCLINE HYCLATE 100 MG PO TABS
100.0000 mg | ORAL_TABLET | Freq: Two times a day (BID) | ORAL | Status: AC
  Administered 2021-12-07 – 2021-12-09 (×5): 100 mg via ORAL
  Filled 2021-12-07 (×5): qty 1

## 2021-12-07 MED ORDER — GUAIFENESIN ER 600 MG PO TB12
1200.0000 mg | ORAL_TABLET | Freq: Two times a day (BID) | ORAL | Status: DC
  Administered 2021-12-07 – 2021-12-14 (×13): 1200 mg via ORAL
  Filled 2021-12-07 (×13): qty 2

## 2021-12-07 NOTE — Progress Notes (Signed)
Physical Therapy Treatment Patient Details Name: Jeffrey Mullen MRN: [id number] DOB: March 05, 1951 Today's Date: 12/07/2021   History of Present Illness 70 y/o male presented to ED on 10/23/21 after bystanders witnessed patient collapse with associated seizure. Another seizure witnessed in ED. CT head negative. CT C-spine negative. LTM EEG on 11/1 showed 2 seizures from R anterior temporal region. Last seizure noted on EEG on 11/3. Intubated 10/31-11/11. Trach placed 11/11. 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    PT Comments    Pt received in supine, agreeable to therapy session and with good participation and improved standing tolerance. He was able to initiate gait trial with RW and chair follow up to 42ft with min/modA and did c/o dizziness and BLE fatigue, needing 1-2 minutes seated rest between bouts. He was able to stand 4 times with modA. Encouraged him to remain up in chair not more than 2 hours at a time and to frequently reposition/alert nursing team if skin or chair gets damp. Pt continues to benefit from PT services to progress toward functional mobility goals and discussed pt progress with evaluating PT Chesley Noon W, who added new ambulatory/exercise goals.  Recommendations for follow up therapy are one component of a multi-disciplinary discharge planning process, led by the attending physician.  Recommendations may be updated based on patient status, additional functional criteria and insurance authorization.  Follow Up Recommendations  Other (comment) (Likely will need long term SNF with PT services)     Assistance Recommended at Discharge Frequent or constant Supervision/Assistance  Equipment Recommendations  Rolling walker (2 wheels);BSC/3in1;Other  (comment) (may benefit from wheelchair/cushion)    Recommendations for Other Services Other (comment) (monitor for tolerance for STAR program or consider AIR if able to reach modI status.)     Precautions / Restrictions Precautions Precautions: Fall Precaution Comments: trach (capped as of 12/13), condom cath, L eye blindness Restrictions Weight Bearing Restrictions: No     Mobility  Bed Mobility Overal bed mobility: Needs Assistance Bed Mobility: Rolling;Sidelying to Sit Rolling: Min assist Sidelying to sit: Max assist;HOB elevated;+2 for safety/equipment       General bed mobility comments: required heavy assist and multimodal cues to raise trunk and move BLEs, drowsy initially and more alert once EOB    Transfers Overall transfer level: Needs assistance Equipment used: Rolling walker (2 wheels) Transfers: Sit to/from Stand;Bed to chair/wheelchair/BSC Sit to Stand: Mod assist;From elevated surface;Min assist           General transfer comment: from elevated bed to RW with minA, then modA from lower chair height to RW and +2 for safety    Ambulation/Gait Ambulation/Gait assistance: Mod assist;+2 safety/equipment Gait Distance (Feet): 10 Feet (x2 with seated break between) Assistive device: Rolling walker (2 wheels) Gait Pattern/deviations: Step-to pattern;Decreased stride length;Shuffle;Decreased dorsiflexion - right;Decreased dorsiflexion - left;Narrow base of support;Drifts right/left       General Gait Details: close chair follow for safety, pt needs assist to manage RW and for step sequencing    Modified Rankin (Stroke Patients Only) Modified Rankin (Stroke Patients Only) Pre-Morbid Rankin Score: No symptoms Modified Rankin: Severe disability     Balance Overall balance assessment: Needs assistance Sitting-balance support: Feet supported;Bilateral upper extremity supported Sitting balance-Leahy Scale: Fair Sitting balance - Comments: able to use BUE  for support and supervision/min guard for safety  Postural control: Left lateral lean;Posterior lean Standing balance support: Bilateral upper extremity supported Standing balance-Leahy Scale: Poor Standing balance comment: reliant on RW for balance, dizzy with standing trial        Cognition Arousal/Alertness: Awake/alert Behavior During Therapy: WFL for tasks assessed/performed (somewhat flat) Overall Cognitive Status: No family/caregiver present to determine baseline cognitive functioning Area of Impairment: Attention;Memory;Awareness;Safety/judgement;Following commands;Problem solving     Orientation Level: Disoriented to;Time;Situation Current Attention Level: Sustained Memory: Decreased short-term memory;Decreased recall of precautions Following Commands: Follows one step commands consistently;Follows one step commands with increased time Safety/Judgement: Decreased awareness of safety;Decreased awareness of deficits Awareness: Intellectual Problem Solving: Slow processing;Difficulty sequencing;Requires verbal cues;Requires tactile cues General Comments: Increased time to follow directions, pt needs reminder for safe hand placement and did not recall from previous session but good participation.        Exercises      General Comments General comments (skin integrity, edema, etc.): VSS on RA      Pertinent Vitals/Pain Faces Pain Scale: Hurts little more Pain Location: neck pain/throat after coughing after eating spaghetti for lunch Pain Descriptors / Indicators: Discomfort;Grimacing Pain Intervention(s): Monitored during session;Repositioned     PT Goals (current goals can now be found in the care plan section) Acute Rehab PT Goals Patient Stated Goal: to get stronger and walk again PT Goal Formulation: With patient Time For Goal Achievement: 12/11/21 (goals updated by [facility] on 12/5) Progress towards PT goals: Progressing toward goals    Frequency    Min  4X/week      PT Plan Current plan remains appropriate    Co-evaluation PT/OT/SLP Co-Evaluation/Treatment: Yes Reason for Co-Treatment: Complexity of the patient's impairments (multi-system involvement);Necessary to address cognition/behavior during functional activity;For patient/therapist safety;To address functional/ADL transfers PT goals addressed during session: Mobility/safety with mobility;Balance;Proper use of DME;Strengthening/ROM OT goals addressed during session: ADL's and self-care      AM-PAC PT "6 Clicks" Mobility   Outcome Measure  Help needed turning from your back to your side while in a flat bed without using bedrails?: A Little Help needed moving from lying on your back to sitting on the side of a flat bed without using bedrails?: A Lot Help needed moving to and from a bed to a chair (including a wheelchair)?: A Lot Help needed standing up from a chair using your arms (e.g., wheelchair or bedside chair)?: A Lot Help needed to walk in hospital room?: Total Help needed climbing 3-5 steps with a railing? : Total 6 Click Score: 11    End of Session Equipment Utilized During Treatment: Gait belt Activity Tolerance: Patient tolerated treatment well Patient left: with call bell/phone within reach;Other (comment);in bed (pt refusing prevalon boots) Nurse Communication: Mobility status PT Visit Diagnosis: Other abnormalities of gait and mobility (R26.89);Other symptoms and signs involving the nervous system (R29.898);Muscle weakness (generalized) (M62.81)     Time: 1610-9604 PT Time Calculation (min) (ACUTE ONLY): 28 min  Charges:  $Gait Training: 8-22 mins                     Burlin Mcnair P., PTA Acute Rehabilitation Services Pager: [phone] Office: [place] 12/07/2021, 2:05 PM

## 2021-12-07 NOTE — Progress Notes (Signed)
Occupational Therapy Treatment Patient Details Name: Jeffrey Mullen MRN: [id number] DOB: 03/29/1951 Today's Date: 12/07/2021   History of present illness 70 y/o male presented to ED on 10/23/21 after bystanders witnessed patient collapse with associated seizure. Another seizure witnessed in ED. CT head negative. CT C-spine negative. LTM EEG on 11/1 showed 2 seizures from R anterior temporal region. Last seizure noted on EEG on 11/3. Intubated 10/31-11/11. Trach placed 11/11. 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   OT comments  Patient received in bed and agreeable to PT/OT session. Patient required max assist for bed mobility and mod assist for transfers with patient quickly fatigued. Patient performed grooming standing at sink but had to complete seated due to fatigue. Patient continues to make good gains with therapy.  Acute OT to continue to follow.    Recommendations for follow up therapy are one component of a multi-disciplinary discharge planning process, led by the attending physician.  Recommendations may be updated based on patient status, additional functional criteria and insurance authorization.    Follow Up Recommendations  Skilled nursing-short term rehab (<3 hours/day)    Assistance Recommended at Discharge Frequent or constant Supervision/Assistance  Equipment Recommendations  Wheelchair (measurements OT);Wheelchair cushion (measurements OT);Hospital bed;Other (comment)    Recommendations for Other Services      Precautions / Restrictions Precautions Precautions: Fall Precaution Comments: trach (capped as of 12/13), condom cath, L eye blindness Restrictions Weight Bearing Restrictions: No       Mobility Bed Mobility Overal bed mobility:  Needs Assistance Bed Mobility: Rolling;Sidelying to Sit Rolling: Min assist Sidelying to sit: Max assist;HOB elevated;+2 for safety/equipment       General bed mobility comments: required heavy assist and multimodal cues to raise trunk and move BLEs, drowsy initially and more alert once EOB    Transfers Overall transfer level: Needs assistance Equipment used: Rolling walker (2 wheels) Transfers: Sit to/from Stand;Bed to chair/wheelchair/BSC Sit to Stand: Mod assist;From elevated surface;Min assist           General transfer comment: from elevated bed to RW with minA, then modA from lower chair height to RW and +2 for safety     Balance Overall balance assessment: Needs assistance Sitting-balance support: Feet supported;Bilateral upper extremity supported Sitting balance-Leahy Scale: Fair Sitting balance - Comments: able to use BUE for support and supervision/min guard for safety Postural control: Left lateral lean;Posterior lean Standing balance support: Bilateral upper extremity supported Standing balance-Leahy Scale: Poor Standing balance comment: reliant on RW for balance, dizzy with standing trial                           ADL either performed or assessed with clinical judgement   ADL Overall ADL's : Needs assistance/impaired     Grooming: Brushing hair;Standing;Sitting;Minimal assistance                                 General ADL Comments: performed brushing hair while standing with assistance for balance but asked to complete seated due to fatigue    Extremity/Trunk Assessment Upper Extremity Assessment RUE Sensation: decreased light touch;decreased proprioception RUE Coordination: decreased fine motor;decreased gross motor LUE Sensation: decreased light touch LUE Coordination: decreased fine motor;decreased gross motor  Vision       Perception     Praxis      Cognition Arousal/Alertness: Awake/alert Behavior  During Therapy: WFL for tasks assessed/performed (somewhat flat) Overall Cognitive Status: No family/caregiver present to determine baseline cognitive functioning Area of Impairment: Attention;Memory;Awareness;Safety/judgement;Following commands;Problem solving                 Orientation Level: Disoriented to;Time;Situation Current Attention Level: Sustained Memory: Decreased short-term memory;Decreased recall of precautions Following Commands: Follows one step commands consistently;Follows one step commands with increased time Safety/Judgement: Decreased awareness of safety;Decreased awareness of deficits Awareness: Intellectual Problem Solving: Slow processing;Difficulty sequencing;Requires verbal cues;Requires tactile cues General Comments: Increased time to follow directions, pt needs reminder for safe hand placement and did not recall from previous session but good participation.          Exercises     Shoulder Instructions       General Comments VSS on RA    Pertinent Vitals/ Pain       Faces Pain Scale: Hurts little more Pain Location: neck pain/throat after coughing after eating spaghetti for lunch Pain Descriptors / Indicators: Discomfort;Grimacing Pain Intervention(s): Monitored during session;Repositioned  Home Living                                          Prior Functioning/Environment              Frequency  Min 2X/week        Progress Toward Goals  OT Goals(current goals can now be found in the care plan section)  Progress towards OT goals: Progressing toward goals  Acute Rehab OT Goals OT Goal Formulation: Patient unable to participate in goal setting Time For Goal Achievement: 12/12/21 Potential to Achieve Goals: Fair ADL Goals Pt Will Perform Grooming: with min assist;sitting Pt Will Perform Upper Body Bathing: with min assist;sitting Additional ADL Goal #1: Pt will follow 3 step command 25% of session  attempts Additional ADL Goal #2: Pt will initate ADL task on L side 50% of attempts Additional ADL Goal #3: Pt will tolerate EOB static sitting min (A) as precursor to adls  Plan Discharge plan remains appropriate;Frequency remains appropriate    Co-evaluation    PT/OT/SLP Co-Evaluation/Treatment: Yes Reason for Co-Treatment: Complexity of the patient's impairments (multi-system involvement);Necessary to address cognition/behavior during functional activity;For patient/therapist safety;To address functional/ADL transfers PT goals addressed during session: Mobility/safety with mobility;Balance;Proper use of DME;Strengthening/ROM OT goals addressed during session: ADL's and self-care      AM-PAC OT "6 Clicks" Daily Activity     Outcome Measure   Help from another person eating meals?: A Lot Help from another person taking care of personal grooming?: A Little Help from another person toileting, which includes using toliet, bedpan, or urinal?: A Lot Help from another person bathing (including washing, rinsing, drying)?: A Lot Help from another person to put on and taking off regular upper body clothing?: A Lot Help from another person to put on and taking off regular lower body clothing?: A Lot 6 Click Score: 13    End of Session Equipment Utilized During Treatment: Gait belt;Rolling walker (2 wheels)  OT Visit Diagnosis: Unsteadiness on feet (R26.81);Muscle weakness (generalized) (M62.81)   Activity Tolerance Patient tolerated treatment well   Patient Left in chair;with call bell/phone within reach;with chair alarm set   Nurse Communication Mobility status  Time: 0148-4039 OT Time Calculation (min): 28 min  Charges: OT General Charges $OT Visit: 1 Visit OT Treatments $Self Care/Home Management : 8-22 mins  Lodema Hong, [place]  Pager [phone] Office [place] 12/07/2021, 1:35 PM

## 2021-12-07 NOTE — Progress Notes (Signed)
NAME:  Jeffrey Mullen, MRN:  [id number], DOB:  1951/11/28, LOS: 69 ADMISSION DATE:  10/23/2021, CONSULTATION DATE:  10/31 REFERRING MD:  Dr. Dina Rich, CHIEF COMPLAINT:  AMS   History of Present Illness:  70 y/o M who presented to [facility] ER on 10/31 via EMS with reports of altered mental status.  Given  right sided gaze, altered and left sided weakness, code stoke called. The patient was taken emergently to CT for imaging which was negative for LVO.  While in the [place], he had a witnessed seizure.    PCCM called for ICU admission.   Chart review shows he was seen in the ER on 10/4 after passing out at the [employer].  Physical exam was negative at the time.  Labs negative.  No further imaging obtained.   Pertinent  Medical History  Alcohol Use - unclear quantity Anxiety / Depression  Congenital blindness of left eye, cataract on R Hepatitis C+, treated, better in 2016 Seizures   Significant Hospital Events: Including procedures, antibiotic start and stop dates in addition to other pertinent events   10/31 Admit with AMS, seizure, hypertensive emergency, AKI 11/1 MRI brain no acute intracranial process. No etiology is seen for the patient's seizure. 11/02 bedside LP attempted but unsuccessful  11/03  No seizures seen on EEG overnight  11/04 weaning sedation to assess for seizure reoccurrence  11/9 bronchoscopy with noted thick secretions in LLL 11/10 Left PICC placed 11/11 Trach,  Cor Track placed, New Embolic Strokes per MRI 27/25 Post Trach Day 1, some oozing from site, Echo results noted below 11/13 Low grade fever, tachy, LFT's remain elevated with ALT and Alk Phos continuing to rise.  S/p lasix  11/14 weaned PSV 10/5, no changes, foley removed 11/15 on ATC, TEE attempted- unable to pass probe, briefly on Neo, cortrak removed fort TEE, tmax 102.3 11/17 afebrile, NG on cultures, continues to have copious secretions 11/20 fever trend is down, on Ancef 11/22: only tolerating  PSV trials for 3 hours a day; broadened antibiotics to zosyn/vanc due to fever spike; recultured 12/9 MBS. Solid pharyngeal residue clears with a liquid wash, thin residue is mild except for one instance of sensed aspiration with thin and pill just before and after (due to backflow) the swallow. Pt had PMSV in place during testing and vocal quality and cough strength adequate to protect airway. He did have frequent coughing throughout testing not related to aspiration. Regular solids;Thin liquid.  12/9 changed trach to 6 cuffless. Starting capping trials  12/15 cap removed, suctioned several times overnight because he can't clear thick secretions  Interim History / Subjective:   12/15 cap removed, suctioned several times overnight because he can't clear thick secretions He says the secretions have worsened over the last few days   Objective   Blood pressure 112/85, pulse 88, temperature 98.2 F (36.8 C), temperature source Oral, resp. rate (!) 24, height '5\' 10"'  (1.778 m), weight 73.7 kg, SpO2 96 %.        Intake/Output Summary (Last 24 hours) at 12/07/2021 1348 Last data filed at 12/07/2021 0600 Gross per 24 hour  Intake 720 ml  Output 1800 ml  Net -1080 ml    Physical Exam:  General:  coughing spells in bed HENT: NCAT OP clear tracheostomy in place PULM: CTA B, frequent cough with high pitched upper airway wheeze CV: RRR, no mgr GI: BS+, soft, nontender MSK: normal bulk and tone Neuro: awake, alert, no distress, [facility]  Problem list   AKI due to contrast-induced ATN, resolved Nontraumatic rhabdomyolysis, resolved Non-anion Gap Metabolic Acidosis, resolved Assessment & Plan:   Suspected multifocal bilateral emboic stroke 11/11: TEE difficult and Alcohol abuse with prior SDH Status epilepticus-resolved; no seizures on EEG; Sepsis:  Acute hypoxemic respiratory failure, on mechanical ventilation Status post tracheostomy 11/11 Hypernatremia Hypokalemia,  resolved Cirrhosis Prediabetes HTN, better controlled now Anemia of critical illness Goals of care: DNR  Presumed COPD  Pulmonary problem list: 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  Suspect tracheobronchitis   He has developed tracheobronchitis as he is now producing more mucus in the last few days and cannot clear them past the tracheostomy.  I'm not sure that the tracheostomy is needed at this point but it is hard to remove it when we are still suctioning his trachea several times a day.    Plan Add doxycycline for 5 days Add guaifenesin Stop capping trial for now, back to speaking valve Resume capping trial on 12/19 If he coughs the tracheostomy out I wouldn't replace it Continue brovana/yupelri When he is decannulated and out of the hospital he needs spirometry testing and pulmonary follow up  Roselie Awkward, MD [place] P[facility] Pager: [phone] Cell: [phone] After 7:00 pm call [place]  [phone]

## 2021-12-07 NOTE — Progress Notes (Signed)
Speech Language Pathology Treatment: Dysphagia  Patient Details Name: Jeffrey Mullen MRN: [id number] DOB: Sep 13, 1951 Today's Date: 12/07/2021 Time: 5997-7414 SLP Time Calculation (min) (ACUTE ONLY): 13 min  Assessment / Plan / Recommendation Clinical Impression  Pt was seen for skilled ST targeting diet tolerance.  Pt was encountered awake/alert in bed and was agreeable to this session.  Pt reported that he has had coughing in the presence and absence of PO intake.  Pt was seen with trials of thin liquid, puree, and regular solids.  He tolerated 5+ oz of thin liquid without overt s/sx of aspiration or difficulty.  Delayed, strong, prolonged coughing was noted following puree and particularly regular solid trials.  Of note, MBS reported coughing during study unrelated to aspiration.  Recommend diet change to Dysphagia 3 (soft) solids and thin liquids with medication administered crushed in puree secondary to post prandial aspiration risk.     HPI HPI: 70 y/o male presented to ED on 10/23/21 after bystanders witnessed patient collapse with associated seizure. Another seizure witnessed in ED. CT head negative. CT C-spine negative. LTM EEG on 11/1 showed 2 seizures from R anterior temporal region. Last seizure noted on EEG on 11/3. Intubated 10/31-11/11. Trach placed 11/11. 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  Recommendations may be updated based on patient status, additional functional criteria and insurance authorization.    Recommendations  Diet recommendations: Dysphagia  3 (mechanical soft);Thin liquid Liquids provided via: Cup;Straw Medication Administration: Crushed with puree Supervision: Staff to assist with self feeding;Full supervision/cueing for compensatory strategies Compensations: Slow rate;Small sips/bites;Minimize environmental distractions;Follow solids with liquid Postural Changes and/or Swallow Maneuvers: Seated upright 90 degrees;Upright 30-60 min after meal      Patient may use Passy-Muir Speech Valve: During all waking hours (remove during sleep);During PO intake/meals         Oral Care Recommendations: Oral care BID Follow Up Recommendations: Skilled nursing-short term rehab (<3 hours/day) Assistance recommended at discharge: Frequent or constant Supervision/Assistance SLP Visit Diagnosis: Dysphagia, pharyngoesophageal phase (R13.14) Plan: Continue with current plan of care          Colin Mulders M.S., [place] Office: [phone]  [place]  12/07/2021, 3:10 PM

## 2021-12-07 NOTE — Progress Notes (Addendum)
[facility] PROGRESS NOTE  Jeffrey Mullen:[id number] DOB: 11/12/1951 DOA: 10/23/2021 PCP: [name], NP  Status: Remains inpatient appropriate because:  Unsafe discharge plan; currently not medically stable  Barriers to discharge: Social: Patient unable to independently make decisions regarding medical care-guardianship process pending  Clinical: Core track in place, trach tube in place, significantly impaired mobility secondary to right hemiparesis with therapies rec LTAC  Level of care:  Progressive   Code Status: DNR Family Communication: Has no family available to assist.  Girlfriend also unable to assist with patient after discharge. DVT prophylaxis: Subcutaneous heparin COVID vaccination status: 02/18/2020, 04/25/2020 and 11/11/2020  HPI: 70 y/o M with past medical history of hypertension, glaucoma who presented to [facility] ER on 10/31 via EMS with reports of altered mental status.  Given  right sided gaze, altered and left sided weakness, code stoke called. The patient was taken emergently to CT for imaging which was negative for LVO.  While in the [place], he had a witnessed seizure.  PCCM called for ICU admission. Chart review shows he was seen in the ER on 10/4 after passing out at the [employer].  Physical exam was negative at the time.  Labs negative.  No further imaging obtained.    Subjective: Patient awake and complaining of being cold secondary to the environment.  Warm blanket applied.  Denies trouble breathing with cath in place over trach  Objective: Vitals:   12/06/21 2343 12/07/21 0350  BP: 122/70 (!) 111/57  Pulse: 77 74  Resp: 16 11  Temp: 97.7 F (36.5 C) 98.2 F (36.8 C)  SpO2: 100% 96%    Intake/Output Summary (Last 24 hours) at 12/07/2021 0740 Last data filed at 12/07/2021 0600 Gross per 24 hour  Intake 720 ml  Output 2600 ml  Net -1880 ml    Filed Weights   11/16/21 0500 11/17/21 0408 11/18/21 0423  Weight: 82.8 kg 77.3  kg 73.7 kg    Exam:  Constitutional: NAD, calm Respiratory: 4.0 cuffless trach trach 5 L, trach site unremarkable, lung sounds are clear upon anterior exam.  Red trach In place and tolerating Cardiovascular: Regular rate, S1-S2.  No extremity edema.  Normotensive Abdomen: Soft and nontender with normoactive bowel sounds.  LBM 12/14 Skin: Left arm wound covered with dressing which is clean dry and intact Neurologic: CN 2-12 grossly intact. Sensation intact, Strength 4/5 on left, marked decrease strength on right: Arm is 3/5 and leg is 3+/5 Psychiatric:  alert and appropriately interactive.  Oriented times name and place somewhat to year after redirected  Assessment/Plan: Acute problems: Acute multifocal bilateral embolic stroke TTE: No embolic source was identified.  TEE was attempted but could not be performed successfully. LDL 66.  HbA1c 6.0.  Continue aspirin and statin Noted to be quite deconditioned.  PT and OT.  Alcoholic liver cirrhosis PCP notes document history of alcohol abuse and known cirrhosis LFTS variable but typically normal to only mildly elevated w/ normal TB Past two readings AST 70-80 range and ALT in the 150s Statin and and Bactrim discontinued Follow LFTs-last checked 12/11 and recommend repeat no sooner than 12/18   Status epilepticus Resolved.   Continue Keppra and lacosamide.     Acute metabolic encephalopathy Resolved   Sepsis likely secondary to tracheitis Trach cultures were positive for MSSA and completed all antibiotics by 11/28 Repeat sputum culture on 12 /1 with few MSSA resistant to inducible clindamycin, erythromycin and clindamycin-Since fevers are low-grade Bactrim initiated but dc'd on  D # 6 (12/12) 2/2 transaminitis  Tracheostomy dependent Capping trials initiated at 11:28 AM on 12/13 but as of this morning 12/14 PMSV back in place.  No documentation in chart to explain why cath was removed and PMSV placed CXR 12/14  neg  Dysphagia Resolved Now on regular diet with thin liquid Remove cortrack tube 54/65   Left cephalic vein superficial thrombophlebitis/left arm extravasation injury (dorsal surface) Status post phentolamine and nitroglycerin application.   Continue local wound /hydrotherapy noting significant improvement in appearance of the wound as of 12/7 -12/13 discussed with wound care RN and PT performing hydrotherapy.  We all agree wound is improving significantly and you days patient will be able to transition to wound care without hydrotherapy.  No indication to consult plastic surgery in regards to evaluation for skin graft      10/23/2021                  11/20/2021     11/29/2021                  12/06/2021  Hypernatremia/high volume stool output Rectal tube discontinued 12/13 Sodium 129 on 12/11  Physical deconditioning SNF for rehab   Essential hypertension/grade 2 diastolic dysfunction Continue amlodipine, carvedilol  Anemia of critical illness Hemoglobin 10.7 on 11/28   Hypokalemia Resolved with potassium 4.2 as of 11/29  Acute kidney injury due to contrast-induced ATN Resolved.   Current creatinine 0.86  Nontraumatic rhabdomyolysis Resolved   Goals of care Former significant other requested that she no longer wanted to be the surrogate decision maker.  Previously his biological daughter has also mentioned that she does not want to be the primary decision maker.  Social worker to help conduct search for alternative family members/next of kin. After discussions with critical care medicine and with two-physician documentation patient's CODE STATUS was changed to DNR.  See note from 11/27.    Data Reviewed: Basic Metabolic Panel: Recent Labs  Lab 12/03/21 0237  NA 129*  K 4.3  CL 97*  CO2 22  GLUCOSE 151*  BUN 21  CREATININE 0.79  CALCIUM 9.2   Liver Function Tests: Recent Labs  Lab 12/03/21 0237  AST 82*  ALT 150*  ALKPHOS 242*  BILITOT 0.1*  PROT  8.4*  ALBUMIN 2.6*    CBC: Recent Labs  Lab 12/03/21 0237  WBC 4.6  HGB 11.1*  HCT 34.1*  MCV 86.1  PLT 265    CBG: Recent Labs  Lab 12/06/21 1641 12/06/21 2022 12/07/21 0008 12/07/21 0430 12/07/21 0737  GLUCAP 124* 168* 101* 106* 116*    No results found for this or any previous visit (from the past 240 hour(s)).     Scheduled Meds:  amLODipine  10 mg Oral Daily   arformoterol  15 mcg Nebulization BID   aspirin  81 mg Oral Daily   carvedilol  12.5 mg Oral BID WC   chlorhexidine  15 mL Mouth Rinse BID   collagenase   Topical BID   dorzolamide-timolol  1 drop Right Eye BID   feeding supplement  1 Container Oral TID BM   fiber  1 packet Oral BID   folic acid  1 mg Oral Daily   heparin  5,000 Units Subcutaneous Q8H   insulin aspart  0-15 Units Subcutaneous Q4H   lacosamide  200 mg Oral BID   latanoprost  1 drop Right Eye QHS   levETIRAcetam  500 mg Oral BID   mouth rinse  15  mL Mouth Rinse q12n4p   multivitamin with minerals  1 tablet Oral Daily   nicotine  14 mg Transdermal Daily   pantoprazole  40 mg Oral Daily   revefenacin  175 mcg Nebulization Daily   saccharomyces boulardii  250 mg Oral BID   scopolamine  1 patch Transdermal Q72H   sodium chloride flush  10-40 mL Intracatheter Q12H   thiamine  100 mg Oral Daily   Continuous Infusions:  sodium chloride Stopped (11/07/21 0016)   sodium chloride 5 mL/hr at 11/19/21 0949    Active Problems:   Status epilepticus ([place])   Acute respiratory failure with hypercapnia (HCC)   Acute encephalopathy   Cryptogenic stroke (HCC)   Pressure injury of skin   AKI (acute kidney injury) ([place])   Seizure ([place])   Acute respiratory failure with hypoxia (HCC)   Status post tracheostomy ([place])   Hypokalemia   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 ([place])   Oropharyngeal dysphagia   Transaminitis   Tracheostomy care [facility])   Consultants: Neurology PCCM Nephrology  Procedures: EEG Echocardiogram a bubble  study Core track Tracheostomy Lumbar puncture in interventional radiology  Antibiotics: Acyclovir 11/1 through 11/4 Azithromycin 11/4 through 11/7 Ceftriaxone 11/4 through 11/7 Linezolid 11/4 through 11/5 Unasyn 11/8 through 11/11 Ceftriaxone 11/11 through 11/12 Zosyn 11/17 through 11/19 Cefazolin 11/19 through 11/21 Zosyn 11/22 through 11/28 Vancomycin 11/22 through 11/28   Time spent: 30 minutes    Erin Hearing ANP  [facility] 7 am - 330 pm/M-F for direct patient care and secure chat Please refer to Amion for contact info 45  days

## 2021-12-08 DIAGNOSIS — J40 Bronchitis, not specified as acute or chronic: Secondary | ICD-10-CM

## 2021-12-08 LAB — GLUCOSE, CAPILLARY
Glucose-Capillary: 109 mg/dL — ABNORMAL HIGH (ref 70–99)
Glucose-Capillary: 141 mg/dL — ABNORMAL HIGH (ref 70–99)
Glucose-Capillary: 150 mg/dL — ABNORMAL HIGH (ref 70–99)
Glucose-Capillary: 160 mg/dL — ABNORMAL HIGH (ref 70–99)
Glucose-Capillary: 256 mg/dL — ABNORMAL HIGH (ref 70–99)
Glucose-Capillary: 93 mg/dL (ref 70–99)

## 2021-12-08 MED ORDER — INSULIN ASPART 100 UNIT/ML IJ SOLN
0.0000 [IU] | Freq: Three times a day (TID) | INTRAMUSCULAR | Status: DC
  Administered 2021-12-08: 2 [IU] via SUBCUTANEOUS
  Administered 2021-12-09: 3 [IU] via SUBCUTANEOUS
  Administered 2021-12-10 – 2021-12-14 (×6): 2 [IU] via SUBCUTANEOUS

## 2021-12-08 NOTE — TOC Progression Note (Signed)
Transition of Care [facility]) - Progression Note    Patient Details  Name: Jeffrey Mullen MRN: [id number] Date of Birth: 18-Apr-1951  Transition of Care [facility]) CM/SW [place], RN Phone Number: 12/08/2021, 11:42 AM  Clinical Narrative:    CM and MSW with DTP Team continue to follow the patient for discharge planning and needed SNF placement.  The patient was having difficulty clearing respiratory secretions during capping trails so capping trials will be resumed on 12/11/2021 and likely decannulation of tracheostomy at some point in time per MD notes.  CM and MSW will continue to follow the patient for SNF placement.   Expected Discharge Plan: [place] Barriers to Discharge: Continued Medical Work up  Expected Discharge Plan and Services Expected Discharge Plan: [place]   Discharge Planning Services: CM Consult Post Acute Care Choice: [place] Living arrangements for the past 2 months: Apartment                                       Social Determinants of Health (SDOH) Interventions    Readmission Risk Interventions No flowsheet data found.

## 2021-12-08 NOTE — Progress Notes (Addendum)
Physical Therapy Treatment Patient Details Name: Jeffrey Mullen MRN: [id number] DOB: Jan 16, 1951 Today's Date: 12/08/2021   History of Present Illness 70 y/o male presented to ED on 10/23/21 after bystanders witnessed patient collapse with associated seizure. Another seizure witnessed in ED. CT head negative. CT C-spine negative. LTM EEG on 11/1 showed 2 seizures from R anterior temporal region. Last seizure noted on EEG on 11/3. Intubated 10/31-11/11. Trach placed 11/11. 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    PT Comments    Pt received in supine, sleeping but easily awoken and agreeable to supine/seated exercises with encouragement. Pt c/o fatigue but able to perform UE/LE A/AAROM therapeutic exercises with good tolerance and pulling himself to long sit from bed in chair egress position. Pt needing increased back support in upright posture (maxA) today therefore defer EOB/standing tasks due to pt fatigue and lack of +2 assist. Pt continues to benefit from PT services to progress toward functional mobility goals. Continue to recommend SNF.  Recommendations for follow up therapy are one component of a multi-disciplinary discharge planning process, led by the attending physician.  Recommendations may be updated based on patient status, additional functional criteria and insurance authorization.  Follow Up Recommendations  Skilled nursing-short term rehab (<3 hours/day)     Assistance Recommended at Discharge Frequent or constant Supervision/Assistance  Equipment Recommendations  Rolling walker (2 wheels);BSC/3in1;Other (comment) (may benefit from wheelchair/cushion)    Recommendations for Other Services Other (comment) (monitor for tolerance for STAR program or  consider AIR if able to reach modI status.)     Precautions / Restrictions Precautions Precautions: Fall Precaution Comments: trach (back on PMSV with humidified medical air on 12/16), condom cath, L eye blindness, skin breakdown/bleeding in peri area Restrictions Weight Bearing Restrictions: No     Mobility  Bed Mobility Overal bed mobility: Needs Assistance Bed Mobility: Supine to Sit     Supine to sit: Max assist;HOB elevated Sit to supine: Max assist;HOB elevated   General bed mobility comments: with air bed in chair egress position, pt able to pull forward with BUE on side rails to long sit with maxA +1, tolerates sitting upright in long sit ~1 minute prior to return to upright HOB posture. Pt agreeble to sit up with bed in chair posture ~20 mins and verbalized understanding of bed remote to return bed to supine    Transfers                   General transfer comment: pt defers due to fatigue today    Ambulation/Gait                   Stairs             Wheelchair Mobility    Modified Rankin (Stroke Patients Only) Modified Rankin (Stroke Patients Only) Pre-Morbid Rankin Score: No symptoms Modified Rankin: Severe disability     Balance Overall balance assessment: Needs assistance Sitting-balance support: Feet supported;Bilateral upper extremity supported Sitting balance-Leahy Scale: Poor Sitting balance - Comments: long sitting in bed needs mod/maxA and BUE support of bed rails                                    Cognition Arousal/Alertness: Awake/alert Behavior  During Therapy: WFL for tasks assessed/performed Overall Cognitive Status: No family/caregiver present to determine baseline cognitive functioning Area of Impairment: Attention;Memory;Awareness;Safety/judgement;Following commands;Problem solving                 Orientation Level: Disoriented to;Time;Situation Current Attention Level: Sustained Memory:  Decreased short-term memory;Decreased recall of precautions Following Commands: Follows one step commands consistently;Follows one step commands with increased time Safety/Judgement: Decreased awareness of safety;Decreased awareness of deficits Awareness: Intellectual Problem Solving: Slow processing;Difficulty sequencing;Requires verbal cues;Requires tactile cues General Comments: Increased time to follow directions, pt initially sleeping but awoken easily and participatory with encouragement. Pt defers EOB due to fatigue but agreeable to trial long sitting in bed.        Exercises General Exercises - Upper Extremity Shoulder Flexion: AAROM;Both;10 reps;Supine General Exercises - Lower Extremity Ankle Circles/Pumps: AROM;Both;10 reps;Supine Short Arc Quad: AROM;Both;5 reps;Seated (bed in chair posture) Heel Slides: AROM;Both;Supine;10 reps;AAROM Hip ABduction/ADduction: AROM;Both;10 reps;Supine Straight Leg Raises: AROM;AAROM;Both;10 reps;Supine (AROM on L ;AA on RLE) Other Exercises Other Exercises: pulling forward to long sit with bed rails and 1 minute hold with emphasis on core activation and deep breaths    General Comments General comments (skin integrity, edema, etc.): VSS on 21% humidified medical air and PMSV donned throughout; pt took small sips of water and no apparent difficulty      Pertinent Vitals/Pain Pain Assessment: Faces Faces Pain Scale: Hurts little more Pain Location: peri-area after nursing techs gave him a bath earlier Pain Descriptors / Indicators: Discomfort;Grimacing Pain Intervention(s): Monitored during session;Repositioned;Limited activity within patient's tolerance     PT Goals (current goals can now be found in the care plan section) Acute Rehab PT Goals Patient Stated Goal: to get stronger and walk again PT Goal Formulation: With patient Time For Goal Achievement: 12/11/21 (goals updated by [facility] on 12/5) Progress towards PT goals: Progressing  toward goals (fatigue limiting today)    Frequency    Min 4X/week      PT Plan Current plan remains appropriate       AM-PAC PT "6 Clicks" Mobility   Outcome Measure  Help needed turning from your back to your side while in a flat bed without using bedrails?: A Little Help needed moving from lying on your back to sitting on the side of a flat bed without using bedrails?: A Lot Help needed moving to and from a bed to a chair (including a wheelchair)?: Total (too fatigued today) Help needed standing up from a chair using your arms (e.g., wheelchair or bedside chair)?: Total Help needed to walk in hospital room?: Total Help needed climbing 3-5 steps with a railing? : Total 6 Click Score: 9    End of Session Equipment Utilized During Treatment: Other (comment) (trach collar humidified air 21%) Activity Tolerance: Patient tolerated treatment well;Patient limited by fatigue Patient left: with call bell/phone within reach;Other (comment);in bed;with bed alarm set;with SCD's reapplied (pt refusing prevalon boots but agreeable to SCD w/encouragement; instructed on how to lower bed to supine but bed in chair posture after sipping his water when PTA left room) Nurse Communication: Mobility status;Other (comment) (L SCD not working? pt wants warm blanket and asking if he can eat his cookie (soft diet?)) PT Visit Diagnosis: Other abnormalities of gait and mobility (R26.89);Other symptoms and signs involving the nervous system (R29.898);Muscle weakness (generalized) (M62.81)     Time: 9449-6759 PT Time Calculation (min) (ACUTE ONLY): 24 min  Charges:  $Therapeutic Exercise: 23-37 mins  Houston Siren., PTA Acute Rehabilitation Services Pager: [phone] Office: [place] 12/08/2021, 4:50 PM

## 2021-12-08 NOTE — Progress Notes (Addendum)
[facility] PROGRESS NOTE  Jeffrey Mullen HFW:[id number] DOB: 1951/08/23 DOA: 10/23/2021 PCP: [name], NP  Status: Remains inpatient appropriate because:  Unsafe discharge plan; currently not medically stable  Barriers to discharge: Social: Patient unable to independently make decisions regarding medical care-guardianship process pending  Clinical: Core track in place, trach tube in place, significantly impaired mobility secondary to right hemiparesis with therapies rec LTAC  Level of care:  Progressive   Code Status: DNR Family Communication: Has no family available to assist.  Girlfriend also unable to assist with patient after discharge. DVT prophylaxis: Subcutaneous heparin COVID vaccination status: 02/18/2020, 04/25/2020 and 11/11/2020  HPI: 70 y/o M with past medical history of hypertension, glaucoma who presented to [facility] ER on 10/31 via EMS with reports of altered mental status.  Given  right sided gaze, altered and left sided weakness, code stoke called. The patient was taken emergently to CT for imaging which was negative for LVO.  While in the [place], he had a witnessed seizure.  PCCM called for ICU admission. Chart review shows he was seen in the ER on 10/4 after passing out at the [employer].  Physical exam was negative at the time.  Labs negative.  No further imaging obtained.    Subjective: Sleeping and did not awaken during exam  Objective: Vitals:   12/08/21 0318 12/08/21 0405  BP:  126/72  Pulse:  71  Resp:  16  Temp:    SpO2: 100% 96%    Intake/Output Summary (Last 24 hours) at 12/08/2021 0744 Last data filed at 12/08/2021 0600 Gross per 24 hour  Intake 1050 ml  Output 1700 ml  Net -650 ml    Filed Weights   11/16/21 0500 11/17/21 0408 11/18/21 0423  Weight: 82.8 kg 77.3 kg 73.7 kg    Exam:  Constitutional: NAD, calm Respiratory: 4.0 cuffless trach trach 5 L, trach site unremarkable, lung sounds are coarse-no tracheal  secretions noted  PMSV in place Cardiovascular: Regular rate, S1-S2.  No extremity edema.  Normotensive Abdomen: Soft and nontender with normoactive bowel sounds.  LBM 12/14 Skin: Left arm wound covered with dressing which is clean dry and intact Neurologic: Sleeping but at baseline CN 2-12 grossly intact. Sensation intact, Strength 4/5 on left, marked decrease strength on right: Arm is 3/5 and leg is 3+/5 Psychiatric:  Sleeping  Assessment/Plan: Acute problems: Acute multifocal bilateral embolic stroke TTE: No embolic source was identified.  TEE was attempted but could not be performed successfully. LDL 66.  HbA1c 6.0.  Continue aspirin and statin Noted to be quite deconditioned.   Alcoholic liver cirrhosis PCP notes document history of alcohol abuse and known cirrhosis LFTS variable but typically normal to only mildly elevated w/ normal TB Past two readings AST 70-80 range and ALT in the 150s Statin and and Bactrim discontinued Follow LFTs-last checked 12/11 and recommend repeat no sooner than 12/18   Status epilepticus Resolved.   Continue Keppra and lacosamide.     Acute metabolic encephalopathy Resolved   Sepsis likely secondary to tracheitis Trach cultures were positive for MSSA and completed all antibiotics by 11/28 Repeat sputum culture on 12 /1 with few MSSA resistant to inducible clindamycin, erythromycin and clindamycin-Since fevers are low-grade Bactrim initiated but dc'd on D # 6 (12/12) 2/2 transaminitis 12/15 PCCM documented patient w/ increased secretions requiring more frequent suctioning and suspects another episode of acute tracheobronchitis.  Doxycycline has been initiated for 5 days and guaifenesin has been added  Tracheostomy dependent Capping  trials initiated at 11:28 AM on 12/13 but as of this morning CXR 12/14 neg Because of acute tracheobronchitis capping trials have been ceased but will be resumed on 12/19 at recommendation of PCCM PCCM DOCUMENTS IF  COUGHS OUT TRACH DO NOT REPLACE!!! After decannulated and out of the hospital he will need spirometry testing and pulmonary follow-up  Dysphagia SLP transitioned to D3 diet on 29/52   Left cephalic vein superficial thrombophlebitis/left arm extravasation injury (dorsal surface) Status post phentolamine and nitroglycerin application.   Continue local wound /hydrotherapy noting significant improvement in appearance of the wound as of 12/7 Hydrotherapy discontinued on 12/14      10/23/2021                  11/20/2021     11/29/2021                  12/06/2021  Hypernatremia/high volume stool output Rectal tube discontinued 12/13 Sodium 129 on 12/11  Physical deconditioning SNF for rehab   Essential hypertension/grade 2 diastolic dysfunction Continue amlodipine, carvedilol  Anemia of critical illness Hemoglobin 10.7 on 11/28   Hypokalemia Resolved with potassium 4.2 as of 11/29  Acute kidney injury due to contrast-induced ATN Resolved.   Current creatinine 0.86  Nontraumatic rhabdomyolysis Resolved   Goals of care Former significant other requested that she no longer wanted to be the surrogate decision maker.  Previously his biological daughter has also mentioned that she does not want to be the primary decision maker.  Social worker to help conduct search for alternative family members/next of kin. After discussions with critical care medicine and with two-physician documentation patient's CODE STATUS was changed to DNR.  See note from 11/27.    Data Reviewed: Basic Metabolic Panel: Recent Labs  Lab 12/03/21 0237  NA 129*  K 4.3  CL 97*  CO2 22  GLUCOSE 151*  BUN 21  CREATININE 0.79  CALCIUM 9.2   Liver Function Tests: Recent Labs  Lab 12/03/21 0237  AST 82*  ALT 150*  ALKPHOS 242*  BILITOT 0.1*  PROT 8.4*  ALBUMIN 2.6*    CBC: Recent Labs  Lab 12/03/21 0237  WBC 4.6  HGB 11.1*  HCT 34.1*  MCV 86.1  PLT 265    CBG: Recent Labs  Lab  12/07/21 1652 12/07/21 1918 12/07/21 2016 12/08/21 0006 12/08/21 0357  GLUCAP 117* 113* 120* 160* 93    No results found for this or any previous visit (from the past 240 hour(s)).     Scheduled Meds:  amLODipine  10 mg Oral Daily   arformoterol  15 mcg Nebulization BID   aspirin  81 mg Oral Daily   carvedilol  12.5 mg Oral BID WC   chlorhexidine  15 mL Mouth Rinse BID   collagenase   Topical BID   dorzolamide-timolol  1 drop Right Eye BID   doxycycline  100 mg Oral Q12H   feeding supplement  1 Container Oral TID BM   fiber  1 packet Oral BID   folic acid  1 mg Oral Daily   guaiFENesin  1,200 mg Oral BID   heparin  5,000 Units Subcutaneous Q8H   insulin aspart  0-15 Units Subcutaneous Q4H   lacosamide  200 mg Oral BID   latanoprost  1 drop Right Eye QHS   levETIRAcetam  500 mg Oral BID   mouth rinse  15 mL Mouth Rinse q12n4p   multivitamin with minerals  1 tablet Oral Daily   nicotine  14 mg Transdermal Daily   pantoprazole  40 mg Oral Daily   revefenacin  175 mcg Nebulization Daily   saccharomyces boulardii  250 mg Oral BID   scopolamine  1 patch Transdermal Q72H   sodium chloride flush  10-40 mL Intracatheter Q12H   thiamine  100 mg Oral Daily   Continuous Infusions:  sodium chloride Stopped (11/07/21 0016)   sodium chloride 5 mL/hr at 11/19/21 0949    Active Problems:   Status epilepticus (HCC)   Acute respiratory failure with hypercapnia (HCC)   Acute encephalopathy   Cryptogenic stroke (HCC)   Pressure injury of skin   AKI (acute kidney injury) ([place])   Seizure ([place])   Acute respiratory failure with hypoxia (HCC)   Status post tracheostomy ([place])   Hypokalemia   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 ([place])   Oropharyngeal dysphagia   Transaminitis   Tracheostomy care [facility])    Consultants: Neurology PCCM Nephrology  Procedures: EEG Echocardiogram a bubble study Core track Tracheostomy Lumbar puncture in interventional  radiology  Antibiotics: Acyclovir 11/1 through 11/4 Azithromycin 11/4 through 11/7 Ceftriaxone 11/4 through 11/7 Linezolid 11/4 through 11/5 Unasyn 11/8 through 11/11 Ceftriaxone 11/11 through 11/12 Zosyn 11/17 through 11/19 Cefazolin 11/19 through 11/21 Zosyn 11/22 through 11/28 Vancomycin 11/22 through 11/28   Time spent: 30 minutes    Erin Hearing ANP  [facility] 7 am - 330 pm/M-F for direct patient care and secure chat Please refer to Amion for contact info 46  days

## 2021-12-09 LAB — GLUCOSE, CAPILLARY
Glucose-Capillary: 110 mg/dL — ABNORMAL HIGH (ref 70–99)
Glucose-Capillary: 99 mg/dL (ref 70–99)
Glucose-Capillary: 158 mg/dL — ABNORMAL HIGH (ref 70–99)
Glucose-Capillary: 108 mg/dL — ABNORMAL HIGH (ref 70–99)

## 2021-12-09 NOTE — Progress Notes (Signed)
Progress Note  This patient has a prolonged hospital admission.  Patient is medically optimized for discharge but does not have a safe disposition at this time.  Followed daily on weekdays by Ms. Erin Hearing, NP.  Chart briefly reviewed.  Subjective: Reported some right hip pain.  Denies any other complaints.  As per RN, no acute issues noted.  Objective: Vital signs reviewed and stable.  Saturating at 98% on trach collar at 5 L/min, 21% FiO2. Respiratory exam: Clear to auscultation.  No increased work of breathing.  Respiratory secretions seem to have diminished from a couple days ago. CVS: S1 and S2 heard, RRR.  No JVD, murmurs or pedal edema. Abdominal exam: Nondistended, soft and nontender.  Normal bowel sounds heard. CNS: Has eyes closed but answering appropriately to simple questions.  No focal neurological deficits. Extremities: Moving all extremities symmetrically.  Labs: Has only CBGs which look reasonably controlled.  Assessment and plan:  1.  Tracheostomy dependent.  Ongoing treatment for acute tracheobronchitis with doxycycline x5 days.  Capping trials currently on hold and to be resumed on 12/19.  PCCM following and as per their input, if patient coughs out the tracheostomy, it is not to be replaced. 2.  Other stable conditions as below, detailed in Ms. Ellison's note from 12/16: Acute bilateral embolic stroke: Work-up completed.  On aspirin and statin. Alcoholic liver cirrhosis Status epilepticus: On Keppra and lacosamide Acute metabolic encephalopathy: Reportedly resolved but poor baseline mental status currently. Sepsis likely secondary to tracheitis: Sepsis resolved. Dysphagia: Continue modified diet per SLP recommendations.  Since its been a week since labs, will recheck CBC and LFT in AM.  Vernell Leep, MD,  FACP, [facility], [facility], [facility] (Care Management Physician Certified) [place]  To contact the attending provider between  7A-7P or the covering provider during after hours 7P-7A, please log into the web site [email or web address] and access using universal [place] password for that web site. If you do not have the password, please call the hospital operator.

## 2021-12-10 LAB — GLUCOSE, CAPILLARY
Glucose-Capillary: 131 mg/dL — ABNORMAL HIGH (ref 70–99)
Glucose-Capillary: 113 mg/dL — ABNORMAL HIGH (ref 70–99)
Glucose-Capillary: 126 mg/dL — ABNORMAL HIGH (ref 70–99)
Glucose-Capillary: 137 mg/dL — ABNORMAL HIGH (ref 70–99)

## 2021-12-10 NOTE — Progress Notes (Signed)
Progress Note  This patient has a prolonged hospital admission.  Patient is medically optimized for discharge but does not have a safe disposition at this time.  Followed daily on weekdays by Ms. Erin Hearing, NP.  Chart briefly reviewed.  Subjective: Awake and alert.  Watching TV.  "I am good".  Reports right wrist pain and states that they have taken blood from that arm about a week ago.  Denies any other complaints.  Objective: Vital signs reviewed and stable.  Saturating up to 100% on trach collar at 5 L/min, 21% FiO2.  Small amount of thick creamy liquid and suction canister?  Airway secretions. Respiratory exam: Clear to auscultation.  No increased work of breathing. CVS: S1 and S2 heard, RRR.  No JVD, murmurs or pedal edema. Abdominal exam: Nondistended, soft and nontender.  Normal bowel sounds heard. CNS: Alert, oriented to person and place only.  Follows simple instructions.  No focal deficits Extremities: Moving all extremities symmetrically.  Right wrist examined and no acute findings noted.  No painful movements either.  Labs: Has only CBGs which look reasonably controlled.  I had ordered CBC and CMP for today but for what ever reason, the CMP was discontinued by someone in the CBC was not drawn.  Will reorder for tomorrow.  Assessment and plan:  1.  Tracheostomy dependent.  Ongoing treatment for acute tracheobronchitis with doxycycline x5 days.  Capping trials currently on hold and to be resumed on 12/19.  PCCM following and as per their input, if patient coughs out the tracheostomy, it is not to be replaced. 2.  Other stable conditions as below, detailed in Ms. Ellison's note from 12/16: Acute bilateral embolic stroke: Work-up completed.  On aspirin and statin. Alcoholic liver cirrhosis Status epilepticus: On Keppra and lacosamide Acute metabolic encephalopathy: Reportedly resolved but poor baseline mental status currently. Sepsis likely secondary to tracheitis: Sepsis  resolved. Dysphagia: Continue modified diet per SLP recommendations. 3.  Right wrist pain: Unclear etiology.  No acute findings on exam.  Monitor.  Since its been a week since labs, will recheck CBC and LFT in AM.  Vernell Leep, MD,  FACP, [facility], [facility], [facility] (Care Management Physician Certified) [place]  To contact the attending provider between 7A-7P or the covering provider during after hours 7P-7A, please log into the web site [email or web address] and access using universal [place] password for that web site. If you do not have the password, please call the hospital operator.

## 2021-12-11 ENCOUNTER — Inpatient Hospital Stay (HOSPITAL_COMMUNITY): Payer: Medicare Other

## 2021-12-11 DIAGNOSIS — M25631 Stiffness of right wrist, not elsewhere classified: Secondary | ICD-10-CM

## 2021-12-11 LAB — GLUCOSE, CAPILLARY
Glucose-Capillary: 121 mg/dL — ABNORMAL HIGH (ref 70–99)
Glucose-Capillary: 149 mg/dL — ABNORMAL HIGH (ref 70–99)
Glucose-Capillary: 123 mg/dL — ABNORMAL HIGH (ref 70–99)
Glucose-Capillary: 116 mg/dL — ABNORMAL HIGH (ref 70–99)

## 2021-12-11 NOTE — Progress Notes (Signed)
[facility] PROGRESS NOTE  Jeffrey Mullen OVF:[id number] DOB: December 11, 1951 DOA: 10/23/2021 PCP: [name], NP  Status: Remains inpatient appropriate because:  Unsafe discharge plan; currently not medically stable  Barriers to discharge: Social: Patient unable to independently make decisions regarding medical care-guardianship process pending  Clinical: Core track in place, trach tube in place, significantly impaired mobility secondary to right hemiparesis with therapies rec LTAC  Level of care:  Progressive   Code Status: DNR Family Communication: Has no family available to assist.  Girlfriend also unable to assist with patient after discharge. DVT prophylaxis: Subcutaneous heparin COVID vaccination status: 02/18/2020, 04/25/2020 and 11/11/2020  HPI: 70 y/o M with past medical history of hypertension, glaucoma who presented to [facility] ER on 10/31 via EMS with reports of altered mental status.  Given  right sided gaze, altered and left sided weakness, code stoke called. The patient was taken emergently to CT for imaging which was negative for LVO.  While in the [place], he had a witnessed seizure.  PCCM called for ICU admission. Chart review shows he was seen in the ER on 10/4 after passing out at the [employer].  Physical exam was negative at the time.  Labs negative.  No further imaging obtained.    Subjective: Continues to report right wrist pain that he attributes to prior IV.  Exam not consistent with venous issues such as swelling or cording.  Does have very limited range of motion in wrist especially with flexion and somewhat with hyperextension.  Patient denies prior remote or recent injury that he can recall.  It is noted that with attempts to flex wrist patient is reporting pain  Objective: Vitals:   12/11/21 0403 12/11/21 0450  BP:  131/72  Pulse: 68 65  Resp: 16 (!) 22  Temp:  97.6 F (36.4 C)  SpO2: 98% 100%    Intake/Output Summary (Last 24 hours) at  12/11/2021 0743 Last data filed at 12/11/2021 8416 Gross per 24 hour  Intake 960 ml  Output 750 ml  Net 210 ml    Filed Weights   11/17/21 0408 11/18/21 0423 12/09/21 0407  Weight: 77.3 kg 73.7 kg 121.1 kg    Exam:  Constitutional: NAD, calm Respiratory: 4.0 cuffless trach trach 5 L, trach site unremarkable, lung sounds are coarse-no tracheal secretions noted  PMSV in place but pulmonary medicine plans to transition to redcap later today. Cardiovascular: Regular rate, S1-S2.  No extremity edema.  Normotensive Abdomen: Soft and nontender with normoactive bowel sounds.  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 Sensation intact, Strength 4/5 on left, marked decrease strength on right: Arm is 3/5 and leg is 3+/5 Psychiatric: Alert and awake.  Oriented x3  Assessment/Plan: Acute problems: Acute multifocal bilateral embolic stroke TTE: No embolic source was identified.  TEE was attempted but could not be performed successfully. LDL 66.  HbA1c 6.0.  Continue aspirin and statin Noted to be quite deconditioned.   Acute right wrist pain No obvious injury but clearly has decreased range of motion especially with flexion and flexion elicits a pain response Obtain complete x-ray of wrist OT consultation Toradol IV x1 dose  Alcoholic liver cirrhosis PCP notes document history of alcohol abuse and known cirrhosis LFTS variable but typically normal to  only mildly elevated w/ normal TB Past two readings AST 70-80 range and ALT in the 150s Statin and and Bactrim discontinued Follow LFTs-last checked 12/11 -12/18 (CMET pending this morning but at patient request will not be drawn until lunchtime)   Status epilepticus Resolved.    Continue Keppra and lacosamide.     Acute metabolic encephalopathy Resolved   Sepsis likely secondary to tracheitis Trach cultures were positive for MSSA and completed all antibiotics by 11/28 Repeat sputum culture on 12 /1 with few MSSA resistant to inducible clindamycin, erythromycin and clindamycin-Since fevers are low-grade Bactrim initiated but dc'd on D # 6 (12/12) 2/2 transaminitis 12/15 PCCM documented patient w/ increased secretions requiring more frequent suctioning and suspects another episode of acute tracheobronchitis.  Doxycycline has been initiated for 5 days and guaifenesin has been added  Tracheostomy dependent Capping trials initiated at 11:28 AM on 12/13 but as of this morning CXR 12/14 neg Because of acute tracheobronchitis capping trials have been ceased but will be resumed on 12/19 at recommendation of PCCM PCCM DOCUMENTS IF COUGHS OUT TRACH DO NOT REPLACE!!! After decannulated and out of the hospital he will need spirometry testing and pulmonary follow-up  Dysphagia SLP transitioned to D3 diet on 01/02   Left cephalic vein superficial thrombophlebitis/left arm extravasation injury (dorsal surface) Status post phentolamine and nitroglycerin application.   Continue local wound /hydrotherapy noting significant improvement in appearance of the wound as of 12/7 Hydrotherapy discontinued on 12/14      10/23/2021                  11/20/2021     11/29/2021                  12/06/2021  Hypernatremia/high volume stool output Rectal tube discontinued 12/13 Sodium 129 on 12/11  Physical deconditioning SNF for rehab   Essential hypertension/grade 2 diastolic dysfunction Continue amlodipine, carvedilol  Anemia of critical illness Hemoglobin 10.7 on 11/28   Hypokalemia Resolved with potassium 4.2 as of 11/29  Acute kidney injury due to contrast-induced ATN Resolved.   Current creatinine 0.86  Nontraumatic rhabdomyolysis Resolved   Goals of care Former  significant other requested that she no longer wanted to be the surrogate decision maker.  Previously his biological daughter has also mentioned that she does not want to be the primary decision maker.  Social worker to help conduct search for alternative family members/next of kin. After discussions with critical care medicine and with two-physician documentation patient's CODE STATUS was changed to DNR.  See note from 11/27.    Data Reviewed: Basic Metabolic Panel: No results for input(s): NA, K, CL, CO2, GLUCOSE, BUN, CREATININE, CALCIUM, MG, PHOS in the last 168 hours.  Liver Function Tests: No results for input(s): AST, ALT, ALKPHOS, BILITOT, PROT, ALBUMIN in the last 168 hours.   CBC: No results for input(s): WBC, NEUTROABS, HGB, HCT, MCV, PLT in the last 168 hours.   CBG: Recent Labs  Lab 12/10/21 0642 12/10/21 1143 12/10/21 1548 12/10/21 2145 12/11/21 0628  GLUCAP 131* 113* 126* 137* 121*    No results found for this or any previous visit (from the past 240 hour(s)).     Scheduled Meds:  amLODipine  10 mg Oral Daily   arformoterol  15 mcg Nebulization BID   aspirin  81 mg Oral Daily   carvedilol  12.5 mg Oral BID WC   chlorhexidine  15 mL Mouth Rinse BID   collagenase   Topical BID  dorzolamide-timolol  1 drop Right Eye BID   feeding supplement  1 Container Oral TID BM   fiber  1 packet Oral BID   folic acid  1 mg Oral Daily   guaiFENesin  1,200 mg Oral BID   heparin  5,000 Units Subcutaneous Q8H   insulin aspart  0-15 Units Subcutaneous TID WC   lacosamide  200 mg Oral BID   latanoprost  1 drop Right Eye QHS   levETIRAcetam  500 mg Oral BID   mouth rinse  15 mL Mouth Rinse q12n4p   multivitamin with minerals  1 tablet Oral Daily   nicotine  14 mg Transdermal Daily   pantoprazole  40 mg Oral Daily   revefenacin  175 mcg Nebulization Daily   saccharomyces boulardii  250 mg Oral BID   scopolamine  1 patch Transdermal Q72H   sodium chloride flush  10-40  mL Intracatheter Q12H   thiamine  100 mg Oral Daily   Continuous Infusions:  sodium chloride Stopped (11/07/21 0016)   sodium chloride 5 mL/hr at 11/19/21 0949    Active Problems:   Status epilepticus ([place])   Acute respiratory failure with hypercapnia (HCC)   Acute encephalopathy   Cryptogenic stroke (HCC)   Pressure injury of skin   AKI (acute kidney injury) ([place])   Seizure ([place])   Acute respiratory failure with hypoxia (HCC)   Status post tracheostomy ([place])   Hypokalemia   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 ([place])   Oropharyngeal dysphagia   Transaminitis   Tracheostomy care [facility])   Tracheobronchitis    Consultants: Neurology PCCM Nephrology  Procedures: EEG Echocardiogram a bubble study Core track Tracheostomy Lumbar puncture in interventional radiology  Antibiotics: Acyclovir 11/1 through 11/4 Azithromycin 11/4 through 11/7 Ceftriaxone 11/4 through 11/7 Linezolid 11/4 through 11/5 Unasyn 11/8 through 11/11 Ceftriaxone 11/11 through 11/12 Zosyn 11/17 through 11/19 Cefazolin 11/19 through 11/21 Zosyn 11/22 through 11/28 Vancomycin 11/22 through 11/28   Time spent: 30 minutes    Erin Hearing ANP  [facility] 7 am - 330 pm/M-F for direct patient care and secure chat Please refer to Amion for contact info 49  days

## 2021-12-11 NOTE — Progress Notes (Signed)
Physical Therapy Treatment Patient Details Name: Jeffrey Mullen MRN: [id number] DOB: [birth year] Today's Date: 12/11/2021   History of Present Illness 70 y/o male presented to ED on 10/23/21 after bystanders witnessed patient collapse with associated seizure. Another seizure witnessed in ED. CT head negative. CT C-spine negative. LTM EEG on 11/1 showed 2 seizures from R anterior temporal region. Last seizure noted on EEG on 11/3. Intubated 10/31-11/11. Trach placed 11/11. 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    PT Comments    Pt received in supine, agreeable to therapy session and with good participation and tolerance for pre-gait and transfer training at bedside with RW support. Pt c/o sacral discomfort, RN notified and reviewed pressure relief strategies with him and turning q2H/floating heels. Pt BP stable seated EOB but he did complain of dizziness after standing, unable to safely assess standing BP plan to check orthostatics next session. Pt continues to benefit from PT services to progress toward functional mobility goals. Continue to recommend SNF.   Recommendations for follow up therapy are one component of a multi-disciplinary discharge planning process, led by the attending physician.  Recommendations may be updated based on patient status, additional functional criteria and insurance authorization.  Follow Up Recommendations  Skilled nursing-short term rehab (<3 hours/day)     Assistance Recommended at Discharge Frequent or constant Supervision/Assistance  Equipment Recommendations  Rolling walker (2 wheels);BSC/3in1;Other (comment) (may benefit from wheelchair/cushion)    Recommendations for Other Services Other (comment) (monitor for tolerance for  STAR program or consider AIR if able to reach modI status.)     Precautions / Restrictions Precautions Precautions: Fall Precaution Comments: trach (capped again 12/19), L eye blindness, skin breakdown in peri area/sacral wounds Restrictions Weight Bearing Restrictions: No     Mobility  Bed Mobility Overal bed mobility: Needs Assistance Bed Mobility: Supine to Sit Rolling: Min assist Sidelying to sit: Mod assist;HOB elevated     Sit to sidelying: Mod assist General bed mobility comments: multimodal cues for technique to log roll and sit up on L EOB; encouraged him not to push with RUE as x-ray results not yet interpreted by ortho MD.    Transfers Overall transfer level: Needs assistance Equipment used: Rolling walker (2 wheels) Transfers: Sit to/from Stand;Bed to chair/wheelchair/BSC Sit to Stand: Mod assist;From elevated surface;Min assist           General transfer comment: from EOB x2 trial, encouraged him to push from side rail with LUE and not to push through RUE due to prior wrist pain/x-ray pending    Ambulation/Gait Ambulation/Gait assistance: Mod assist   Assistive device: Rolling walker (2 wheels)       Pre-gait activities: sidesteps x3 along EOB and forward/retro at bedside, hip flexion x10 reps     Stairs             Wheelchair Mobility    Modified Rankin (Stroke Patients Only) Modified Rankin (Stroke Patients Only) Pre-Morbid Rankin Score: No symptoms Modified Rankin: Severe disability     Balance Overall balance assessment: Needs assistance Sitting-balance support: Feet supported;Bilateral upper extremity supported Sitting balance-Leahy Scale: Poor Sitting balance - Comments: min guard throughout for safety   Standing balance support: Bilateral upper extremity supported Standing balance-Leahy Scale: Poor Standing balance comment: reliant on RW for balance, dizzy with standing trial  Cognition Arousal/Alertness: Awake/alert Behavior During Therapy: WFL for tasks assessed/performed Overall Cognitive Status: No family/caregiver present to determine baseline cognitive functioning Area of Impairment: Attention;Memory;Awareness;Safety/judgement;Following commands;Problem solving                 Orientation Level: Disoriented to;Time;Situation Current Attention Level: Sustained Memory: Decreased short-term memory;Decreased recall of precautions Following Commands: Follows one step commands consistently;Follows one step commands with increased time Safety/Judgement: Decreased awareness of safety;Decreased awareness of deficits Awareness: Intellectual Problem Solving: Slow processing;Difficulty sequencing;Requires verbal cues;Requires tactile cues General Comments: Increased time to follow directions, pt initially sleeping but awoken easily and participatory with encouragement. Pt states "I want to impress you" re: mobility and motivated to get better.        Exercises Other Exercises Other Exercises: standing hip flexion x10 reps ea Other Exercises: STS x 2 trials for BLE strengthening Other Exercises: seated BLE AROM: ankle pumps, LAQ x10 reps ea    General Comments General comments (skin integrity, edema, etc.): BP 117/73 seated EOB (pt c/o dizziness), SpO2 97-100% on RA (trach capped today), HR 62-70 bpm      Pertinent Vitals/Pain Pain Assessment: Faces Faces Pain Scale: Hurts little more Pain Location: bottom while seated EOB and while in supine Pain Descriptors / Indicators: Discomfort;Grimacing Pain Intervention(s): Limited activity within patient's tolerance;Monitored during session;Repositioned (assisted him to semi-sidelying to L at end of session)    Home Living                          Prior Function            PT Goals (current goals can now be found in the care plan section) Acute Rehab PT Goals Patient Stated Goal: to get  stronger and walk again PT Goal Formulation: With patient Time For Goal Achievement: 12/11/21 Progress towards PT goals: Progressing toward goals    Frequency    Min 4X/week      PT Plan Current plan remains appropriate    Co-evaluation              AM-PAC PT "6 Clicks" Mobility   Outcome Measure  Help needed turning from your back to your side while in a flat bed without using bedrails?: A Little Help needed moving from lying on your back to sitting on the side of a flat bed without using bedrails?: A Lot Help needed moving to and from a bed to a chair (including a wheelchair)?: A Lot Help needed standing up from a chair using your arms (e.g., wheelchair or bedside chair)?: A Lot Help needed to walk in hospital room?: Total Help needed climbing 3-5 steps with a railing? : Total 6 Click Score: 11    End of Session Equipment Utilized During Treatment: Gait belt Activity Tolerance: Patient tolerated treatment well Patient left: with call bell/phone within reach;Other (comment);in bed;with bed alarm set;with SCD's reapplied (pt agreeable to prevalon boots w/encouragement and sidelying to L for comfort) Nurse Communication: Mobility status;Other (comment) (pt used urinal, needs it dumped/charted so he has it ready for next time) PT Visit Diagnosis: Other abnormalities of gait and mobility (R26.89);Other symptoms and signs involving the nervous system (R29.898);Muscle weakness (generalized) (M62.81)     Time: 1537-1600 PT Time Calculation (min) (ACUTE ONLY): 23 min  Charges:  $Therapeutic Exercise: 8-22 mins $Therapeutic Activity: 8-22 mins                     Retta Pitcher P., PTA Acute  Rehabilitation Services Pager: [phone] Office: [place] 12/11/2021, 4:19 PM

## 2021-12-11 NOTE — Plan of Care (Signed)

## 2021-12-11 NOTE — Progress Notes (Signed)
RT NOTE: Patient's trach capped with red cap per CCM. Patient tolerating well at this time. Vitals are stable and sats are 98%. RT will continue to monitor.

## 2021-12-11 NOTE — Progress Notes (Signed)
NAME:  Jeffrey Mullen, MRN:  [id number], DOB:  [birth year], LOS: 38 ADMISSION DATE:  10/23/2021, CONSULTATION DATE:  10/31 REFERRING MD:  Dr. Dina Rich, CHIEF COMPLAINT:  AMS   History of Present Illness:  70 y/o M who presented to [facility] ER on 10/31 via EMS with reports of altered mental status.  Given  right sided gaze, altered and left sided weakness, code stoke called. The patient was taken emergently to CT for imaging which was negative for LVO.  While in the [place], he had a witnessed seizure.    PCCM called for ICU admission.   Chart review shows he was seen in the ER on 10/4 after passing out at the [employer].  Physical exam was negative at the time.  Labs negative.  No further imaging obtained.   Pertinent  Medical History  Alcohol Use - unclear quantity Anxiety / Depression  Congenital blindness of left eye, cataract on R Hepatitis C+, treated, better in 2016 Seizures   Significant Hospital Events: Including procedures, antibiotic start and stop dates in addition to other pertinent events   10/31 Admit with AMS, seizure, hypertensive emergency, AKI 11/1 MRI brain no acute intracranial process. No etiology is seen for the patient's seizure. 11/02 bedside LP attempted but unsuccessful  11/03  No seizures seen on EEG overnight  11/04 weaning sedation to assess for seizure reoccurrence  11/9 bronchoscopy with noted thick secretions in LLL 11/10 Left PICC placed 11/11 Trach,  Cor Track placed, New Embolic Strokes per MRI 35/00 Post Trach Day 1, some oozing from site, Echo results noted below 11/13 Low grade fever, tachy, LFT's remain elevated with ALT and Alk Phos continuing to rise.  S/p lasix  11/14 weaned PSV 10/5, no changes, foley removed 11/15 on ATC, TEE attempted- unable to pass probe, briefly on Neo, cortrak removed fort TEE, tmax 102.3 11/17 afebrile, NG on cultures, continues to have copious secretions 11/20 fever trend is down, on Ancef 11/22: only tolerating  PSV trials for 3 hours a day; broadened antibiotics to zosyn/vanc due to fever spike; recultured 12/9 MBS. Solid pharyngeal residue clears with a liquid wash, thin residue is mild except for one instance of sensed aspiration with thin and pill just before and after (due to backflow) the swallow. Pt had PMSV in place during testing and vocal quality and cough strength adequate to protect airway. He did have frequent coughing throughout testing not related to aspiration. Regular solids;Thin liquid.  12/9 changed trach to 6 cuffless. Starting capping trials  12/15 cap removed, suctioned several times overnight because he can't clear thick secretions, started doxycycline 12/19 trach capped again  Interim History / Subjective:   Feels OK Trach capped this morning  Objective   Blood pressure 118/66, pulse 80, temperature 97.8 F (36.6 C), temperature source Axillary, resp. rate 18, height $RemoveBe'5\' 10"'FYLHExAVE$  (1.778 m), weight 121.1 kg, SpO2 98 %.    FiO2 (%):  [21 %] 21 %   Intake/Output Summary (Last 24 hours) at 12/11/2021 1210 Last data filed at 12/11/2021 0900 Gross per 24 hour  Intake 600 ml  Output 750 ml  Net -150 ml    Physical Exam:  General:  Resting comfortably in bed HENT: NCAT OP clear PULM: CTA B, normal effort CV: RRR, no mgr GI: BS+, soft, nontender MSK: normal bulk and tone Neuro: awake, alert, no distress, MAEW   Resolved Hospital Problem list   AKI due to contrast-induced ATN, resolved Nontraumatic rhabdomyolysis, resolved Non-anion Gap Metabolic Acidosis, resolved  Assessment & Plan:   Suspected multifocal bilateral emboic stroke 11/11: TEE difficult and Alcohol abuse with prior SDH Status epilepticus-resolved; no seizures on EEG; Sepsis:  Acute hypoxemic respiratory failure, on mechanical ventilation Status post tracheostomy 11/11 Hypernatremia Hypokalemia, resolved Cirrhosis Prediabetes HTN, better controlled now Anemia of critical illness Goals of care: DNR   Presumed COPD  Pulmonary problem list: 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  Tracheobronchitis > improved  12/19 after antibiotics   Plan Stop doxycycline  Cap the trach today If able to stay capped by 12/20 then we can remove Brovana/yupelri Will ultimately need PFT as outpatient  Roselie Awkward, MD [place] [facility] Pager: [phone] Cell: [phone] After 7:00 pm call [place]  [phone]

## 2021-12-11 NOTE — Plan of Care (Signed)
  Problem: Education: Goal: Knowledge of General Education information will improve Description Including pain rating scale, medication(s)/side effects and non-pharmacologic comfort measures Outcome: Progressing   Problem: Health Behavior/Discharge Planning: Goal: Ability to manage health-related needs will improve Outcome: Progressing   

## 2021-12-11 NOTE — TOC Progression Note (Signed)
Transition of Care [facility] - Progression Note    Patient Details  Name: Jeffrey Mullen MRN: [id number] Date of Birth: 1951/12/13  Transition of [facility] CM/SW [place], RN Phone Number: 12/11/2021, 12:42 PM  Clinical Narrative:    CM met with the patient at the bedside to discuss transitions of care for SNF facility once he has completed tracheostomy capping trials and is likely able to be de-cannulated.  The patient is agreeable to [facility] placement at this point.  The patient was alert and oriented this morning and might be able to give permission for admission to SNF facility at that point.  The patient has pending guardianship with [facility] for medical guardianship - but this may be cancelled at a later date if not needed for SNF placement.  CM and MSW with DTP Team will continue to follow the patient for needed SNF placement once tracheostomy decannulation occurs at a lter date.   Expected Discharge Plan: [place] Barriers to Discharge: Continued Medical Work up  Expected Discharge Plan and Services Expected Discharge Plan: [place]   Discharge Planning Services: CM Consult Post Acute Care Choice: [place] Living arrangements for the past 2 months: Apartment                                       Social Determinants of Health (SDOH) Interventions    Readmission Risk Interventions No flowsheet data found.

## 2021-12-11 NOTE — Progress Notes (Signed)
Patient refused AM labs patient stated "his arm hurt"  MD made aware  Eathon Valade, Tivis Ringer, RN

## 2021-12-11 NOTE — Consult Note (Addendum)
[facility] Nurse wound follow up Wound type: Refer to previous [facility] consult notes.  Re-assessment of sacrum/bilat buttocks/left heel wounds.  Left heel with previous deep tissue pressure injury which has evolved into Unstageable; 50% red/50% black, loose peeling skin to wound edges. 5X5cm Sacrum with Unstageable presure injury slowly improving and decreasing in size; 2X.8X.2cm, 80% red, 20% yellow, small amt tan drainage. Left buttock with Unstageable pressure injury; 2X2X.1cm, 80% red, 20% yellow, small amt tan drainage Right buttock with healing Stage 3 pressure injury; .3X.3X.1cm, pink and moist.  All 3 areas are surrounded with pink dry scar tissue and are no longer connected.  Dressing procedure/placement/frequency: Continue present plan of care as follows for bedside nurses to perform:  Float heels to reduce pressure.  Foam dressing to protect from further injury. Apply Santyl to left buttocks/sacrum daily and cover with moist gauze dressing, then foam dressing.  (Change foam dressing Q 3 days or PRN soiling.) WOC team will reassess the locations weekly to determine if a change in the plan of care is indicated at that time.  Julien Girt MSN, RN, [place], [place], [place]

## 2021-12-11 NOTE — Progress Notes (Signed)
Nutrition Follow-up  DOCUMENTATION CODES:   Not applicable  INTERVENTION:  -double protein portions all meals -Continue Boost Breeze po TID, each supplement provides 250 kcal and 9 grams of protein  NUTRITION DIAGNOSIS:   Inadequate oral intake related to inability to eat as evidenced by NPO status.  Progressing, pt on soft diet  GOAL:   Patient will meet greater than or equal to 90% of their needs  progressing  MONITOR:   PO intake, Supplement acceptance, Diet advancement, Skin, TF tolerance, Weight trends, Labs, I & O's  REASON FOR ASSESSMENT:   Ventilator    ASSESSMENT:   Pt with PMH of hepatitis C, alcohol abuse and seizures admitted with with status epilepticus.  11/11 cortrak placed 11/15 cortrak removed 11/16 cortrak replaced (gastric tip per xray) 12/09 diet advanced to regular with thin liquids  12/12 - cortrak removed 12/14 - diet downgraded to soft  Pt with trach capped again as of this morning and, per CCM, plan to remove 12/20 if able to stay capped.   Pt alert, awake, and oriented x3. C/O R wrist pain with no signs of obvious injury but notable decrease in ROM. Pt to have xray of wrist today per NP with OT c/s.   Pt has had excellent PO intake since last RD visit with 100% meal completion x last 8 recorded meals. Pt has orders for Boost Breeze TID and, per RN, has been doing fairly well with supplement. Recommend continue current nutrition plan of care given pt has refused other supplements.   UOP: 774ml x24 hours I/O: -14,887ml since admit  Medications: Scheduled Meds:  amLODipine  10 mg Oral Daily   arformoterol  15 mcg Nebulization BID   aspirin  81 mg Oral Daily   carvedilol  12.5 mg Oral BID WC   chlorhexidine  15 mL Mouth Rinse BID   collagenase   Topical BID   dorzolamide-timolol  1 drop Right Eye BID   feeding supplement  1 Container Oral TID BM   fiber  1 packet Oral BID   folic acid  1 mg Oral Daily   guaiFENesin  1,200 mg Oral  BID   heparin  5,000 Units Subcutaneous Q8H   insulin aspart  0-15 Units Subcutaneous TID WC   lacosamide  200 mg Oral BID   latanoprost  1 drop Right Eye QHS   levETIRAcetam  500 mg Oral BID   mouth rinse  15 mL Mouth Rinse q12n4p   multivitamin with minerals  1 tablet Oral Daily   nicotine  14 mg Transdermal Daily   pantoprazole  40 mg Oral Daily   revefenacin  175 mcg Nebulization Daily   saccharomyces boulardii  250 mg Oral BID   scopolamine  1 patch Transdermal Q72H   sodium chloride flush  10-40 mL Intracatheter Q12H   thiamine  100 mg Oral Daily  Continuous Infusions:  sodium chloride Stopped (11/07/21 0016)   sodium chloride 5 mL/hr at 11/19/21 0949   Labs not updated since 12/11.   CBGs: 113-149 x24 hours    Diet Order:   Diet Order             DIET SOFT Room service appropriate? Yes; Fluid consistency: Thin  Diet effective now                   EDUCATION NEEDS:   Not appropriate for education at this time  Skin:  Skin Assessment: Skin Integrity Issues: Skin Integrity Issues:: Stage II, DTI, Unstageable,  Other (Comment) DTI: L heel Stage II: R heel, L elbow, anus Unstageable: L/R buttocks Other: skin tears to penis, back, arm  Last BM:  12/18  Height:   Ht Readings from Last 1 Encounters:  11/02/21 5\' 10"  (1.778 m)    Weight:   Wt Readings from Last 1 Encounters:  12/09/21 121.1 kg    Ideal Body Weight:  75.5 kg  BMI:  Body mass index is 38.31 kg/m.  Estimated Nutritional Needs:   Kcal:  2100-2300  Protein:  120-140 grams  Fluid:  > 2 /day     Theone Stanley., MS, RD, LDN (she/her/hers) RD pager number and weekend/on-call pager number located in [place].

## 2021-12-12 LAB — GLUCOSE, CAPILLARY
Glucose-Capillary: 106 mg/dL — ABNORMAL HIGH (ref 70–99)
Glucose-Capillary: 113 mg/dL — ABNORMAL HIGH (ref 70–99)
Glucose-Capillary: 119 mg/dL — ABNORMAL HIGH (ref 70–99)
Glucose-Capillary: 150 mg/dL — ABNORMAL HIGH (ref 70–99)
Glucose-Capillary: 88 mg/dL (ref 70–99)

## 2021-12-12 NOTE — Plan of Care (Signed)
Pt is alert oriented x 4. Dressing to buttocks and left arm changed. Prn tylenol given per order for pain to right wrist and right hip. Pt had bowel movement.    Problem: Education: Goal: Knowledge of General Education information will improve Description: Including pain rating scale, medication(s)/side effects and non-pharmacologic comfort measures Outcome: Progressing   Problem: Education: Goal: Knowledge of General Education information will improve Description: Including pain rating scale, medication(s)/side effects and non-pharmacologic comfort measures Outcome: Progressing   Problem: Health Behavior/Discharge Planning: Goal: Ability to manage health-related needs will improve Outcome: Progressing   Problem: Clinical Measurements: Goal: Ability to maintain clinical measurements within normal limits will improve Outcome: Progressing Goal: Will remain free from infection Outcome: Progressing Goal: Diagnostic test results will improve Outcome: Progressing Goal: Respiratory complications will improve Outcome: Progressing Goal: Cardiovascular complication will be avoided Outcome: Progressing   Problem: Activity: Goal: Risk for activity intolerance will decrease Outcome: Progressing   Problem: Nutrition: Goal: Adequate nutrition will be maintained Outcome: Progressing   Problem: Nutrition: Goal: Adequate nutrition will be maintained Outcome: Progressing   Problem: Coping: Goal: Level of anxiety will decrease Outcome: Progressing   Problem: Elimination: Goal: Will not experience complications related to bowel motility Outcome: Progressing Goal: Will not experience complications related to urinary retention Outcome: Progressing   Problem: Pain Managment: Goal: General experience of comfort will improve Outcome: Progressing   Problem: Safety: Goal: Ability to remain free from injury will improve Outcome: Progressing   Problem: Skin Integrity: Goal: Risk for  impaired skin integrity will decrease Outcome: Progressing   Problem: Safety: Goal: Non-violent Restraint(s) Outcome: Progressing

## 2021-12-12 NOTE — NC FL2 (Signed)
[place] LEVEL OF CARE SCREENING TOOL     IDENTIFICATION  Patient Name: Jeffrey Mullen Birthdate: 02/23/1951 Sex: male Admission Date (Current Location): 10/23/2021  [place] and [place] Number:  Kathleen Argue [id number] [place] and Address:  [facility] [employer], [place] [address] [place], [place] [address]      Provider Number: [id number]  Attending Physician Name and Address:  Modena Jansky, MD  Relative Name and Phone Number:       Current Level of Care: Hospital Recommended Level of Care: [place] Prior Approval Number:    Date Approved/Denied:   PASRR Number: [id number]  Discharge Plan: SNF    Current Diagnoses: Patient Active Problem List   Diagnosis Date Noted   Decreased range of motion of right wrist    Tracheobronchitis    Transaminitis    Tracheostomy care [facility])    Oropharyngeal dysphagia    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)    Acute respiratory failure with hypoxia ([place])    Status post tracheostomy ([place])    Hypokalemia    Pressure injury of skin 11/08/2021   AKI (acute kidney injury) ([place])    Seizure ([place])    Cryptogenic stroke ([place])    Acute respiratory failure with hypercapnia ([place])    Acute encephalopathy    Status epilepticus ([place]) 10/23/2021   Cataract Right eye 12/12/2018   Glaucoma, congenital, blind Left eye  12/12/2018   Lives in homeless shelter 12/12/2018   Shoulder pain, right 12/12/2018   Tobacco abuse    ETOH abuse    Benign essential HTN    Hepatic cirrhosis ([place]) 02/10/2015   Chronic hepatitis C without hepatic coma ([place]) 01/05/2015    Orientation RESPIRATION BLADDER Height & Weight     Self, Situation, Time  Normal External catheter Weight: 266 lb 15.6 oz (121.1 kg) Height:  5\' 10"  (177.8 cm)  BEHAVIORAL SYMPTOMS/MOOD NEUROLOGICAL BOWEL NUTRITION STATUS    Convulsions/Seizures (Last seizure documented 10/26/21) Incontinent Diet (Normal)  AMBULATORY STATUS  COMMUNICATION OF NEEDS Skin   Extensive Assist Verbally Hydro Therapy                       Personal Care Assistance Level of Assistance  Bathing, Feeding, Dressing Bathing Assistance: Maximum assistance Feeding assistance: Limited assistance Dressing Assistance: Maximum assistance     Functional Limitations Info  Sight, Hearing, Speech Sight Info: Impaired (Blind in left eye) Hearing Info: Adequate Speech Info: Adequate    SPECIAL CARE FACTORS FREQUENCY  PT (By licensed PT), OT (By licensed OT)     PT Frequency: 5x weekly OT Frequency: 5x weekly     Speech Therapy Frequency: 1-2 x per week      Contractures Contractures Info: Not present    Additional Factors Info  Code Status, Allergies, Insulin Sliding Scale Code Status Info: DNR Allergies Info: No known allergies   Insulin Sliding Scale Info: See discharge summary       Current Medications (12/12/2021):  This is the current hospital active medication list Current Facility-Administered Medications  Medication Dose Route Frequency Provider Last Rate Last Admin   0.9 %  sodium chloride infusion   Intravenous PRN Candee Furbish, MD   Stopped at 11/07/21 0016   0.9 %  sodium chloride infusion  250 mL Intravenous Continuous Frederik Pear, MD 5 mL/hr at 11/19/21 0949 Restarted at 11/19/21 0949   acetaminophen (TYLENOL) tablet 650 mg  650 mg Oral Q4H PRN Erin Hearing  L, NP   650 mg at 12/12/21 1021   amLODipine (NORVASC) tablet 10 mg  10 mg Oral Daily Samella Parr, NP   10 mg at 12/12/21 1021   arformoterol (BROVANA) nebulizer solution 15 mcg  15 mcg Nebulization BID Freddi Starr, MD   15 mcg at 12/12/21 9357   aspirin chewable tablet 81 mg  81 mg Oral Daily Samella Parr, NP   81 mg at 12/12/21 1021   carvedilol (COREG) tablet 12.5 mg  12.5 mg Oral BID WC Samella Parr, NP   12.5 mg at 12/12/21 1021   chlorhexidine (PERIDEX) 0.12 % solution 15 mL  15 mL Mouth Rinse BID Jacky Kindle, MD   15  mL at 12/12/21 1021   collagenase (SANTYL) ointment   Topical BID Modena Jansky, MD   Given at 12/12/21 1023   docusate sodium (COLACE) capsule 100 mg  100 mg Oral Daily PRN Samella Parr, NP       dorzolamide-timolol (COSOPT) 22.3-6.8 MG/ML ophthalmic solution 1 drop  1 drop Right Eye BID Bonnielee Haff, MD   1 drop at 12/12/21 1024   feeding supplement (BOOST / RESOURCE BREEZE) liquid 1 Container  1 Container Oral TID BM Caren Griffins, MD   1 Container at 12/12/21 1024   fiber (NUTRISOURCE FIBER) 1 packet  1 packet Oral BID Samella Parr, NP   1 packet at 01/77/93 9030   folic acid (FOLVITE) tablet 1 mg  1 mg Oral Daily Samella Parr, NP   1 mg at 12/12/21 1021   guaiFENesin (MUCINEX) 12 hr tablet 1,200 mg  1,200 mg Oral BID Simonne Maffucci B, MD   1,200 mg at 12/12/21 1021   heparin injection 5,000 Units  5,000 Units Subcutaneous Q8H Freddi Starr, MD   5,000 Units at 12/12/21 0923   insulin aspart (novoLOG) injection 0-15 Units  0-15 Units Subcutaneous TID WC Samella Parr, NP   2 Units at 12/11/21 1713   ipratropium-albuterol (DUONEB) 0.5-2.5 (3) MG/3ML nebulizer solution 3 mL  3 mL Nebulization Q4H PRN Jacky Kindle, MD       lacosamide (VIMPAT) tablet 200 mg  200 mg Oral BID Samella Parr, NP   200 mg at 12/12/21 1021   latanoprost (XALATAN) 0.005 % ophthalmic solution 1 drop  1 drop Right Eye QHS Bonnielee Haff, MD   1 drop at 12/11/21 2228   levETIRAcetam (KEPPRA) 100 MG/ML solution 500 mg  500 mg Oral BID Vernell Leep D, MD   500 mg at 12/12/21 1021   loperamide (IMODIUM) capsule 4 mg  4 mg Oral PRN Samella Parr, NP       MEDLINE mouth rinse  15 mL Mouth Rinse q12n4p Jacky Kindle, MD   15 mL at 12/11/21 1529   multivitamin with minerals tablet 1 tablet  1 tablet Oral Daily Caren Griffins, MD   1 tablet at 12/12/21 1021   nicotine (NICODERM CQ - dosed in mg/24 hours) patch 14 mg  14 mg Transdermal Daily Bonnielee Haff, MD   14 mg at 12/12/21 1021    pantoprazole (PROTONIX) EC tablet 40 mg  40 mg Oral Daily Samella Parr, NP   40 mg at 12/12/21 1021   polyethylene glycol (MIRALAX / GLYCOLAX) packet 17 g  17 g Per Tube Daily PRN Candee Furbish, MD   17 g at 10/27/21 1547   revefenacin (YUPELRI) nebulizer solution 175 mcg  175 mcg Nebulization Daily  Freddi Starr, MD   175 mcg at 12/12/21 0312   saccharomyces boulardii (FLORASTOR) capsule 250 mg  250 mg Oral BID Samella Parr, NP   250 mg at 12/12/21 1021   scopolamine (TRANSDERM-SCOP) 1 MG/3DAYS 1.5 mg  1 patch Transdermal Q72H Chand, Currie Paris, MD   1.5 mg at 12/11/21 0904   sodium chloride flush (NS) 0.9 % injection 10-40 mL  10-40 mL Intracatheter Q12H Chand, Currie Paris, MD   10 mL at 12/11/21 0904   sodium chloride flush (NS) 0.9 % injection 10-40 mL  10-40 mL Intracatheter PRN Jacky Kindle, MD       thiamine tablet 100 mg  100 mg Oral Daily Samella Parr, NP   100 mg at 12/12/21 1021     Discharge Medications: Please see discharge summary for a list of discharge medications.  Relevant Imaging Results:  Relevant Lab Results:   Additional Information SSN: [id number], COVID vaccines 02/18/2020, 04/25/2020, 11/11/2020  Archie Endo, LCSW

## 2021-12-12 NOTE — Progress Notes (Signed)
[facility] PROGRESS NOTE  Jeffrey Mullen KPQ:[id number] DOB: June 09, 1951 DOA: 10/23/2021 PCP: [name], NP  Status: Remains inpatient appropriate because:  Unsafe discharge plan-rehabilitation facility  Barriers to discharge: Social: Patient unable to independently make decisions regarding medical care-guardianship process pending  Clinical: Decannulated on 12/20, currently tolerating oral diet although requires assistance with meal PT and OT continue to document significant physical deconditioning and requirement for physical therapy prior to being able to discharge to a private home environment  Level of care:  Progressive   Code Status: DNR Family Communication: Has no family available to assist.  Girlfriend also unable to assist with patient after discharge. DVT prophylaxis: Subcutaneous heparin COVID vaccination status: 02/18/2020, 04/25/2020 and 11/11/2020  HPI: 70 y/o M with past medical history of hypertension, glaucoma who presented to [facility] ER on 10/31 via EMS with reports of altered mental status.  Given  right sided gaze, altered and left sided weakness, code stoke called. The patient was taken emergently to CT for imaging which was negative for LVO.  While in the [place], he had a witnessed seizure.  PCCM called for ICU admission. Chart review shows he was seen in the ER on 10/4 after passing out at the [employer].  Physical exam was negative at the time.  Labs negative.  No further imaging obtained.    Subjective: Awakened.  Explained to him rationale for at least 1 more measurement of liver function noting he had refused labs this morning.  Explained that his recent hand and wrist pain was not related to recent IV stick but instead to a broken bone.  He currently has a splint in place as recommended by OT and orthopedic team.  Objective: Vitals:   12/12/21 0731 12/12/21 0800  BP:  124/69  Pulse:  81  Resp:  20  Temp:  98.5 F (36.9 C)  SpO2: 98%  100%    Intake/Output Summary (Last 24 hours) at 12/12/2021 0813 Last data filed at 12/12/2021 0645 Gross per 24 hour  Intake 480 ml  Output 1175 ml  Net -695 ml    Filed Weights   11/17/21 0408 11/18/21 0423 12/09/21 0407  Weight: 77.3 kg 73.7 kg 121.1 kg    Exam:  Constitutional: NAD, calm Respiratory: 4.0 cuffless trach trach 5 L, trach site unremarkable, lung sounds are coarse-no tracheal secretions noted red cap in place over trach opening during my exam and subsequently has been decannulated-O2 saturation 100% Cardiovascular: Regular rate, S1-S2.  No extremity edema.  Normotensive Abdomen: Soft and nontender with normoactive bowel sounds.  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 Sensation intact, Strength 4/5 on left, marked decrease strength on right: Arm is 3/5 and leg is 3+/5 Psychiatric: Alert and awake.  Oriented x3  Assessment/Plan: Acute problems: Acute multifocal bilateral embolic stroke TTE: No embolic source was identified.  TEE was attempted but could not be performed successfully. LDL 66.  HbA1c 6.0.  Continue aspirin and statin Noted to be quite deconditioned.   Acute right wrist pain 2/2 age-indeterminate fracture No obvious injury but clearly has decreased range of motion especially with flexion and flexion elicits a pain response X-ray wrist demonstrates age-indeterminate fracture-fragment lateral to the lunate and inferior to the triquetrum Orthopedics  consultation Toradol IV x1 dose given 16/10  Alcoholic liver cirrhosis PCP notes document history of alcohol abuse and known cirrhosis LFTS variable but typically normal to only mildly elevated w/ normal TB Past two  readings AST 70-80 range and ALT in the 150s Statin and and Bactrim discontinued Follow LFTs-last checked 12/11 -12/20 patient refusing labs that explained reason labs are needed at least 1 more time to follow his liver functions.  He has agreed to have labs drawn at least 1 more time   Status epilepticus Resolved.   Continue Keppra and lacosamide.     Acute metabolic encephalopathy Resolved   Sepsis likely secondary to tracheitis Trach cultures were positive for MSSA and completed all antibiotics by 11/28 Repeat sputum culture on 12 /1 with few MSSA resistant to inducible clindamycin, erythromycin and clindamycin-Since fevers are low-grade Bactrim initiated but dc'd on D # 6 (12/12) 2/2 transaminitis 12/15 PCCM documented patient w/ increased secretions requiring more frequent suctioning and suspects another episode of acute tracheobronchitis.  Doxycycline has been initiated for 5 days and guaifenesin has been added  Tracheostomy dependent Capping trials initiated at 11:28 AM on 12/13 but as of this morning CXR 12/14 neg 12/20 decannulated and stable on room air  Dysphagia SLP transitioned to D3 diet on 96/04   Left cephalic vein superficial thrombophlebitis/left arm extravasation injury (dorsal surface) Status post phentolamine and nitroglycerin application.   Continue local wound /hydrotherapy noting significant improvement in appearance of the wound as of 12/7 Hydrotherapy discontinued on 12/14      10/23/2021                  11/20/2021     11/29/2021                  12/06/2021  Hypernatremia/high volume stool output Rectal tube discontinued 12/13 Sodium 129 on 12/11  Physical deconditioning SNF for rehab Patient reporting dizziness with activity yesterday.  Today 12/20 PT plans on obtaining orthostatic vital signs   Essential hypertension/grade 2 diastolic dysfunction Continue amlodipine, carvedilol  Anemia of critical illness Hemoglobin 10.7 on 11/28    Hypokalemia Resolved with potassium 4.2 as of 11/29  Acute kidney injury due to contrast-induced ATN Resolved.   Current creatinine 0.86  Nontraumatic rhabdomyolysis Resolved   Goals of care Former significant other requested that she no longer wanted to be the surrogate decision maker.  Previously his biological daughter has also mentioned that she does not want to be the primary decision maker.  Social worker to help conduct search for alternative family members/next of kin. After discussions with critical care medicine and with two-physician documentation patient's CODE STATUS was changed to DNR.  See note from 11/27.    Data Reviewed: Basic Metabolic Panel: No results for input(s): NA, K, CL, CO2, GLUCOSE, BUN, CREATININE, CALCIUM, MG, PHOS in the last 168 hours.  Liver Function Tests: No results for input(s): AST, ALT, ALKPHOS, BILITOT, PROT, ALBUMIN in the last 168 hours.   CBC: No results for input(s): WBC, NEUTROABS, HGB, HCT, MCV, PLT in the last 168 hours.   CBG: Recent Labs  Lab 12/11/21 1240 12/11/21 1622 12/11/21 2146 12/12/21 0617 12/12/21 0806  GLUCAP 149* 123* 116* 88 119*    No results found for this or any previous visit (from the past 240 hour(s)).     Scheduled Meds:  amLODipine  10 mg Oral Daily   arformoterol  15 mcg Nebulization BID   aspirin  81 mg Oral Daily  carvedilol  12.5 mg Oral BID WC   chlorhexidine  15 mL Mouth Rinse BID   collagenase   Topical BID   dorzolamide-timolol  1 drop Right Eye BID   feeding supplement  1 Container Oral TID BM   fiber  1 packet Oral BID   folic acid  1 mg Oral Daily   guaiFENesin  1,200 mg Oral BID   heparin  5,000 Units Subcutaneous Q8H   insulin aspart  0-15 Units Subcutaneous TID WC   lacosamide  200 mg Oral BID   latanoprost  1 drop Right Eye QHS   levETIRAcetam  500 mg Oral BID   mouth rinse  15 mL Mouth Rinse q12n4p   multivitamin with minerals  1 tablet Oral Daily   nicotine  14 mg  Transdermal Daily   pantoprazole  40 mg Oral Daily   revefenacin  175 mcg Nebulization Daily   saccharomyces boulardii  250 mg Oral BID   scopolamine  1 patch Transdermal Q72H   sodium chloride flush  10-40 mL Intracatheter Q12H   thiamine  100 mg Oral Daily   Continuous Infusions:  sodium chloride Stopped (11/07/21 0016)   sodium chloride 5 mL/hr at 11/19/21 0949    Active Problems:   Status epilepticus ([place])   Acute respiratory failure with hypercapnia (HCC)   Acute encephalopathy   Cryptogenic stroke (HCC)   Pressure injury of skin   AKI (acute kidney injury) ([place])   Seizure ([place])   Acute respiratory failure with hypoxia (HCC)   Tracheostomy in place [facility])   Hypokalemia   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)   Oropharyngeal dysphagia   Transaminitis   Tracheostomy care ([place])   Tracheobronchitis   Decreased range of motion of right wrist    Consultants: Neurology PCCM Nephrology  Procedures: EEG Echocardiogram a bubble study Core track Tracheostomy Lumbar puncture in interventional radiology  Antibiotics: Acyclovir 11/1 through 11/4 Azithromycin 11/4 through 11/7 Ceftriaxone 11/4 through 11/7 Linezolid 11/4 through 11/5 Unasyn 11/8 through 11/11 Ceftriaxone 11/11 through 11/12 Zosyn 11/17 through 11/19 Cefazolin 11/19 through 11/21 Zosyn 11/22 through 11/28 Vancomycin 11/22 through 11/28   Time spent: 30 minutes    Erin Hearing ANP  [facility] 7 am - 330 pm/M-F for direct patient care and secure chat Please refer to Am[place] for contact info 50  days

## 2021-12-12 NOTE — Progress Notes (Signed)
Physical Therapy Treatment Patient Details Name: Jeffrey Mullen MRN: [id number] DOB: [birth year] Today's Date: 12/12/2021   History of Present Illness 70 y/o male presented to ED on 10/23/21 after bystanders witnessed patient collapse with associated seizure. Another seizure witnessed in ED. CT head negative. CT C-spine negative. LTM EEG on 11/1 showed 2 seizures from R anterior temporal region. Last seizure noted on EEG on 11/3. Intubated 10/31-11/11. Trach placed 11/11. Decannulated 12/20. 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    PT Comments    Pt was seen for mobility to get OOB to walk and then to the chair.  His tolerance for gait was better with RW and close guard of chair but made the decision to seat him before he was overly tired.  Pt is struggling with setting limits and will need to guide him to make safe decisions about movement.  Propped laterally with pillows in the chair to support UE issues and to give pt stability with balance.  Follow along for acute PT goals.   Recommendations for follow up therapy are one component of a multi-disciplinary discharge planning process, led by the attending physician.  Recommendations may be updated based on patient status, additional functional criteria and insurance authorization.  Follow Up Recommendations  Skilled nursing-short term rehab (<3 hours/day)     Assistance Recommended at Discharge Frequent or constant Supervision/Assistance  Equipment Recommendations  Rolling walker (2 wheels);BSC/3in1;Other (comment)    Recommendations for Other Services Other (comment)     Precautions / Restrictions Precautions Precautions: Fall Precaution Comments: trach (capped again 12/19; decannulated 12/20), L eye  blindness, skin breakdown in peri area/sacral wounds Restrictions Weight Bearing Restrictions: No     Mobility  Bed Mobility Overal bed mobility: Needs Assistance Bed Mobility: Supine to Sit     Supine to sit: Min assist     General bed mobility comments: used bed rail and cued to minimize use of R hand    Transfers Overall transfer level: Needs assistance Equipment used: Rolling walker (2 wheels) Transfers: Sit to/from Stand Sit to Stand: Min assist;+2 physical assistance;+2 safety/equipment           General transfer comment: minA +2 to power into standing, 2x    Ambulation/Gait Ambulation/Gait assistance: Min assist;+2 physical assistance;+2 safety/equipment Gait Distance (Feet): 25 Feet Assistive device: Rolling walker (2 wheels) Gait Pattern/deviations: Step-to pattern;Step-through pattern;Decreased stride length;Decreased weight shift to right     Pre-gait activities: weight shift General Gait Details: followed closely with recliner   Stairs             Wheelchair Mobility    Modified Rankin (Stroke Patients Only) Modified Rankin (Stroke Patients Only) Pre-Morbid Rankin Score: No symptoms Modified Rankin: Moderately severe disability     Balance Overall balance assessment: Needs assistance Sitting-balance support: Feet supported;No upper extremity supported Sitting balance-Leahy Scale: Fair Sitting balance - Comments: able to complete bimanual UE task in sitting wtihout LOB   Standing balance support: Single extremity supported;During functional activity Standing balance-Leahy Scale: Poor Standing balance comment: able to complete rear peri hygiene in standing with 1 UE support on RW. Benefits from therapist support for balance                            Cognition Arousal/Alertness: Awake/alert  Behavior During Therapy: WFL for tasks assessed/performed Overall Cognitive Status: No family/caregiver present to determine baseline  cognitive functioning Area of Impairment: Problem solving;Safety/judgement;Following commands                   Current Attention Level: Selective   Following Commands: Follows one step commands inconsistently;Follows one step commands with increased time Safety/Judgement: Decreased awareness of safety;Decreased awareness of deficits Awareness: Intellectual   General Comments: mildly sleepy and woke up quickly        Exercises      General Comments General comments (skin integrity, edema, etc.): sats and HR stable with room air      Pertinent Vitals/Pain Pain Assessment: Faces Faces Pain Scale: Hurts a little bit Breathing: normal Negative Vocalization: none Facial Expression: smiling or inexpressive Body Language: relaxed Consolability: no need to console PAINAD Score: 0 Pain Location: L ankle Pain Descriptors / Indicators: Discomfort Pain Intervention(s): Limited activity within patient's tolerance;Repositioned    Home Living                          Prior Function            PT Goals (current goals can now be found in the care plan section) Acute Rehab PT Goals Patient Stated Goal: wants to walk today    Frequency    Min 4X/week      PT Plan Current plan remains appropriate    Co-evaluation PT/OT/SLP Co-Evaluation/Treatment: Yes Reason for Co-Treatment: Complexity of the patient's impairments (multi-system involvement) PT goals addressed during session: Mobility/safety with mobility;Balance OT goals addressed during session: ADL's and self-care      AM-PAC PT "6 Clicks" Mobility   Outcome Measure  Help needed turning from your back to your side while in a flat bed without using bedrails?: A Little Help needed moving from lying on your back to sitting on the side of a flat bed without using bedrails?: A Lot Help needed moving to and from a bed to a chair (including a wheelchair)?: A Lot Help needed standing up from a chair using  your arms (e.g., wheelchair or bedside chair)?: A Lot Help needed to walk in hospital room?: A Lot Help needed climbing 3-5 steps with a railing? : Total 6 Click Score: 12    End of Session Equipment Utilized During Treatment: Gait belt Activity Tolerance: Patient tolerated treatment well Patient left: with call bell/phone within reach;Other (comment);in bed;with bed alarm set;with SCD's reapplied Nurse Communication: Mobility status;Other (comment) PT Visit Diagnosis: Other abnormalities of gait and mobility (R26.89);Other symptoms and signs involving the nervous system (R29.898);Muscle weakness (generalized) (M62.81)     Time: 8676-7209 PT Time Calculation (min) (ACUTE ONLY): 34 min  Charges:  $Gait Training: 8-22 mins          Ramond Dial 12/12/2021, 6:51 PM  Mee Hives, PT PhD Acute Rehab Dept. Number: [place] [facility] [place]

## 2021-12-12 NOTE — Progress Notes (Signed)
CSW updated FL2 and sent clinicals to facilities for review.  Madilyn Fireman, MSW, LCSW Transitions of Care   Clinical Social Worker II [phone]

## 2021-12-12 NOTE — Plan of Care (Signed)
Mod assist with adls 

## 2021-12-12 NOTE — Progress Notes (Signed)
NAME:  Jeffrey Mullen, MRN:  [id number], DOB:  [birth year], LOS: 54 ADMISSION DATE:  10/23/2021, CONSULTATION DATE:  10/31 REFERRING MD:  Dr. Dina Rich, CHIEF COMPLAINT:  AMS   History of Present Illness:  70 y/o M who presented to [facility] ER on 10/31 via EMS with reports of altered mental status.  Given  right sided gaze, altered and left sided weakness, code stoke called. The patient was taken emergently to CT for imaging which was negative for LVO.  While in the [place], he had a witnessed seizure.    PCCM called for ICU admission.   Chart review shows he was seen in the ER on 10/4 after passing out at the [employer].  Physical exam was negative at the time.  Labs negative.  No further imaging obtained.   Pertinent  Medical History  Alcohol Use - unclear quantity Anxiety / Depression  Congenital blindness of left eye, cataract on R Hepatitis C+, treated, better in 2016 Seizures   Significant Hospital Events: Including procedures, antibiotic start and stop dates in addition to other pertinent events   10/31 Admit with AMS, seizure, hypertensive emergency, AKI 11/1 MRI brain no acute intracranial process. No etiology is seen for the patient's seizure. 11/02 bedside LP attempted but unsuccessful  11/03  No seizures seen on EEG overnight  11/04 weaning sedation to assess for seizure reoccurrence  11/9 bronchoscopy with noted thick secretions in LLL 11/10 Left PICC placed 11/11 Trach,  Cor Track placed, New Embolic Strokes per MRI 56/81 Post Trach Day 1, some oozing from site, Echo results noted below 11/13 Low grade fever, tachy, LFT's remain elevated with ALT and Alk Phos continuing to rise.  S/p lasix  11/14 weaned PSV 10/5, no changes, foley removed 11/15 on ATC, TEE attempted- unable to pass probe, briefly on Neo, cortrak removed fort TEE, tmax 102.3 11/17 afebrile, NG on cultures, continues to have copious secretions 11/20 fever trend is down, on Ancef 11/22: only tolerating  PSV trials for 3 hours a day; broadened antibiotics to zosyn/vanc due to fever spike; recultured 12/9 MBS. Solid pharyngeal residue clears with a liquid wash, thin residue is mild except for one instance of sensed aspiration with thin and pill just before and after (due to backflow) the swallow. Pt had PMSV in place during testing and vocal quality and cough strength adequate to protect airway. He did have frequent coughing throughout testing not related to aspiration. Regular solids;Thin liquid.  12/9 changed trach to 6 cuffless. Starting capping trials  12/15 cap removed, suctioned several times overnight because he can't clear thick secretions, started doxycycline 12/19 trach capped again 12/20  decannulated   Interim History / Subjective:   Feeling well [name] has remained capped  Objective   Blood pressure 124/69, pulse 81, temperature 98.5 F (36.9 C), temperature source Oral, resp. rate 20, height 5' 10" (1.778 m), weight 121.1 kg, SpO2 100 %.    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  Resolved, passed capping trial 12/19 Tracheobronchitis > improved  12/19 after antibiotics   Plan Decannulate today Standard dressing/post decannulation management Continue brovana/yupelri Will follow this week Will need PFT as outpatient   Roselie Awkward, MD [place] [facility] Pager: [phone] Cell: [phone] After 7:00 pm call Elink  [phone]

## 2021-12-12 NOTE — Progress Notes (Signed)
Occupational Therapy Treatment Patient Details Name: Jeffrey Mullen MRN: [id number] DOB: [birth year] Today's Date: 12/12/2021   History of present illness 70 y/o male presented to ED on 10/23/21 after bystanders witnessed patient collapse with associated seizure. Another seizure witnessed in ED. CT head negative. CT C-spine negative. LTM EEG on 11/1 showed 2 seizures from R anterior temporal region. Last seizure noted on EEG on 11/3. Intubated 10/31-11/11. Trach placed 11/11. Decannulated 12/20. 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   OT comments  Ronalee Belts is making great progress, see updated goals in his care plan. Session completed in conjunction with PT to safely progress OOB functional activity. He completed bed mobility with supervision, increased time and verbal cues. He transferred sit<>stand with min A +2 several times, and was able to ambulate within the room with RW and min A +2 and close chair follow.  He was also able to complete peri care in standing with min A. Pt is now in a R Velcro wrist cock up splint, he denies pain with weight bearing or use of RUE during functional tasks. D/c plan remains appropriate and OT will continue to follow acutely.    Recommendations for follow up therapy are one component of a multi-disciplinary discharge planning process, led by the attending physician.  Recommendations may be updated based on patient status, additional functional criteria and insurance authorization.    Follow Up Recommendations  Skilled nursing-short term rehab (<3 hours/day)    Assistance Recommended at Discharge Frequent or constant Supervision/Assistance  Equipment Recommendations  BSC/3in1 (RW)    Recommendations for Other Services Other  (comment);Speech consult    Precautions / Restrictions Precautions Precautions: Fall Precaution Comments: trach (capped again 12/19; decannulated 12/20), L eye blindness, skin breakdown in peri area/sacral wounds Restrictions Weight Bearing Restrictions: No       Mobility Bed Mobility Overal bed mobility: Needs Assistance Bed Mobility: Supine to Sit     Supine to sit: Min assist     General bed mobility comments: no physical assist needed, verbal cues for Ue placement    Transfers Overall transfer level: Needs assistance Equipment used: Rolling walker (2 wheels) Transfers: Sit to/from Stand Sit to Stand: Min assist;+2 physical assistance;+2 safety/equipment           General transfer comment: minA +2 to power into standing, 2x     Balance Overall balance assessment: Needs assistance Sitting-balance support: Feet supported;No upper extremity supported Sitting balance-Leahy Scale: Fair Sitting balance - Comments: able to complete bimanual UE task in sitting wtihout LOB   Standing balance support: Single extremity supported;During functional activity Standing balance-Leahy Scale: Poor Standing balance comment: able to complete rear peri hygiene in standing with 1 UE support on RW. Benefits from therapist support for balance                           ADL either performed or assessed with clinical judgement   ADL Overall ADL's : Needs assistance/impaired Eating/Feeding: Set up;Sitting                       Toilet Transfer: Minimal assistance;+2 for safety/equipment;Ambulation;Rolling walker (2 wheels)   Toileting- Clothing Manipulation and Hygiene: Minimal assistance;Sit to/from stand Toileting - Clothing Manipulation Details (indicate cue type and reason): minA  for thoroughness, verbal cues     Functional mobility during ADLs: Minimal assistance;+2 for safety/equipment;Rolling walker (2 wheels) General ADL Comments: session focused on  incrasing activity tolerance for OOB tasks including LB hygiene in standing, gait progression with RW and completing tasks with RUE given new splint    Extremity/Trunk Assessment Upper Extremity Assessment Upper Extremity Assessment: RUE deficits/detail;LUE deficits/detail RUE Deficits / Details: new vs old fracture of lunate/triqutrum carpal bones. Pt now in velcro wrist cockup splint. No ROM or weight bearing restrictions. Pt declining any pain during functional tasks including ambulating with RW. LUE Deficits / Details: wound on dorsal aspect of hand otherwise usign functionally to wash rear peri area, wash face and use on RW LUE Coordination: decreased fine motor   Lower Extremity Assessment Lower Extremity Assessment: Defer to PT evaluation        Vision   Vision Assessment?: Vision impaired- to be further tested in functional context Additional Comments: L eye blind          Cognition Arousal/Alertness: Awake/alert Behavior During Therapy: WFL for tasks assessed/performed;Flat affect Overall Cognitive Status: No family/caregiver present to determine baseline cognitive functioning               General Comments: pt initally sleeping but easy to rouse and motivated to participate. Pt follows 100% of simple commands, increased time and cues for multistep commands. flat affect.                General Comments VSS on RA    Pertinent Vitals/ Pain       Pain Assessment: Faces Faces Pain Scale: Hurts a little bit Pain Location: L ankle Pain Descriptors / Indicators: Discomfort Pain Intervention(s): Monitored during session   Frequency  Min 2X/week        Progress Toward Goals  OT Goals(current goals can now be found in the care plan section)  Progress towards OT goals: Goals met and updated - see care plan  Acute Rehab OT Goals OT Goal Formulation: With patient Time For Goal Achievement: 12/26/21 Potential to Achieve Goals: Good ADL Goals Pt Will  Perform Grooming: with modified independence;standing Pt Will Perform Lower Body Bathing: with set-up;sit to/from stand Pt Will Perform Lower Body Dressing: with set-up;sit to/from stand Pt Will Transfer to Toilet: with supervision;ambulating Pt/caregiver will Perform Home Exercise Program: Increased ROM;Increased strength;Both right and left upper extremity;With written HEP provided Additional ADL Goal #1: Pt will tolerate at least 6 minutes of OOB functional activity wtih supervision A  Plan Discharge plan remains appropriate;Frequency remains appropriate    Co-evaluation    PT/OT/SLP Co-Evaluation/Treatment: Yes Reason for Co-Treatment: Complexity of the patient's impairments (multi-system involvement);For patient/therapist safety   OT goals addressed during session: ADL's and self-care      AM-PAC OT "6 Clicks" Daily Activity     Outcome Measure   Help from another person eating meals?: A Little Help from another person taking care of personal grooming?: A Little Help from another person toileting, which includes using toliet, bedpan, or urinal?: A Little Help from another person bathing (including washing, rinsing, drying)?: A Lot Help from another person to put on and taking off regular upper body clothing?: A Little Help from another person to put on and taking off regular lower body clothing?: A Lot 6 Click Score: 16    End of Session Equipment Utilized During Treatment: Rolling walker (2 wheels);Gait belt  OT Visit Diagnosis: Unsteadiness on feet (R26.81);Muscle weakness (generalized) (M62.81)   Activity Tolerance Patient tolerated  treatment well   Patient Left in chair;with call bell/phone within reach;with chair alarm set   Nurse Communication Mobility status        Time: 9539-6728 OT Time Calculation (min): 34 min  Charges: OT General Charges $OT Visit: 1 Visit OT Treatments $Self Care/Home Management : 8-22 mins   Layia Walla A Taro Hidrogo 12/12/2021, 3:40  PM

## 2021-12-12 NOTE — Progress Notes (Signed)
SLP Cancellation Note  Patient Details Name: Jeffrey Mullen MRN: [id number] DOB: 04/28/1951   Cancelled treatment:       Reason Eval/Treat Not Completed: Fatigue/lethargy limiting ability to participate  Pt has been decannulated, and is currently sleeping soundly, insufficiently arousable at this time. Will continue efforts.  Marshon Bangs B. Quentin Ore, [facility], [place] Speech Language Pathologist Office: [phone]  Shonna Chock 12/12/2021, 12:50 PM

## 2021-12-12 NOTE — Progress Notes (Signed)
Patient decannulated by RT. Protective barrier placed over stoma. Patient remains on room air. No complications. Patient tolerated well. RT will continue to monitor.

## 2021-12-12 NOTE — Progress Notes (Signed)
Orthopedic Tech Progress Note Patient Details:  Jeffrey Mullen 06-21-1951 [id number]  Ortho Devices Type of Ortho Device: Velcro wrist splint Ortho Device/Splint Location: RUE Ortho Device/Splint Interventions: Ordered, Application, Adjustment   Post Interventions Patient Tolerated: Well Instructions Provided: Care of device  Janit Pagan 12/12/2021, 10:10 AM

## 2021-12-12 NOTE — Consult Note (Signed)
Reason for Consult:Right wrist pain Referring Physician: Vernell Mullen Time called: 2263 Time at bedside: [name] is an 70 y.o. male.  HPI: Jeffrey Mullen has been having right wrist pain for about 10d. He associates the onset with having blood drawn on that side on 12/11. The pain is fairly constant and is actually improved today compared with yesterday. It mostly manifests when he's trying to use his RW. He is RHD. He denies any trauma.  Past Medical History:  Diagnosis Date   Alcohol abuse    Allergy    Anemia    Anxiety    Arthritis    Blind left eye    Cataract, right eye    Congenital blindness    LEFT EYE   Depression    Glaucoma, bilateral    Hepatic cirrhosis ([place])    Hepatitis    HEP C treated in 2016   HTN (hypertension)    Seizures ([place])    3 years ago    Past Surgical History:  Procedure Laterality Date   CATARACT EXTRACTION Left    GLAUCOMA SURGERY     Patient reports 36 or eleven surgeries for Glaucoma    Family History  Problem Relation Age of Onset   Hearing loss Other    Kidney disease Neg Hx    Colon cancer Neg Hx    Esophageal cancer Neg Hx    Rectal cancer Neg Hx    Stomach cancer Neg Hx     Social History:  reports that he has been smoking cigarettes. He has been smoking an average of .25 packs per day. He has never used smokeless tobacco. He reports current alcohol use of about 5.0 standard drinks per week. He reports that he does not use drugs.  Allergies: No Active Allergies  Medications: I have reviewed the patient's current medications.  Results for orders placed or performed during the hospital encounter of 10/23/21 (from the past 48 hour(s))  Glucose, capillary     Status: Abnormal   Collection Time: 12/10/21 11:43 AM  Result Value Ref Range   Glucose-Capillary 113 (H) 70 - 99 mg/dL    Comment: Glucose reference range applies only to samples taken after fasting for at least 8 hours.  Glucose, capillary     Status:  Abnormal   Collection Time: 12/10/21  3:48 PM  Result Value Ref Range   Glucose-Capillary 126 (H) 70 - 99 mg/dL    Comment: Glucose reference range applies only to samples taken after fasting for at least 8 hours.  Glucose, capillary     Status: Abnormal   Collection Time: 12/10/21  9:45 PM  Result Value Ref Range   Glucose-Capillary 137 (H) 70 - 99 mg/dL    Comment: Glucose reference range applies only to samples taken after fasting for at least 8 hours.  Glucose, capillary     Status: Abnormal   Collection Time: 12/11/21  6:28 AM  Result Value Ref Range   Glucose-Capillary 121 (H) 70 - 99 mg/dL    Comment: Glucose reference range applies only to samples taken after fasting for at least 8 hours.  Glucose, capillary     Status: Abnormal   Collection Time: 12/11/21 12:40 PM  Result Value Ref Range   Glucose-Capillary 149 (H) 70 - 99 mg/dL    Comment: Glucose reference range applies only to samples taken after fasting for at least 8 hours.  Glucose, capillary     Status: Abnormal   Collection Time: 12/11/21  4:22 PM  Result Value Ref Range   Glucose-Capillary 123 (H) 70 - 99 mg/dL    Comment: Glucose reference range applies only to samples taken after fasting for at least 8 hours.   Comment 1 Notify RN    Comment 2 Document in Chart   Glucose, capillary     Status: Abnormal   Collection Time: 12/11/21  9:46 PM  Result Value Ref Range   Glucose-Capillary 116 (H) 70 - 99 mg/dL    Comment: Glucose reference range applies only to samples taken after fasting for at least 8 hours.  Glucose, capillary     Status: None   Collection Time: 12/12/21  6:17 AM  Result Value Ref Range   Glucose-Capillary 88 70 - 99 mg/dL    Comment: Glucose reference range applies only to samples taken after fasting for at least 8 hours.  Glucose, capillary     Status: Abnormal   Collection Time: 12/12/21  8:06 AM  Result Value Ref Range   Glucose-Capillary 119 (H) 70 - 99 mg/dL    Comment: Glucose reference  range applies only to samples taken after fasting for at least 8 hours.   Comment 1 Notify RN    Comment 2 Document in Chart     DG Wrist Complete Right  Result Date: 12/11/2021 CLINICAL DATA:  decreased ROM on right wrist. Pt denies any pain on left wrist. EXAM: RIGHT WRIST - COMPLETE 3+ VIEW COMPARISON:  None. FINDINGS: Query age-indeterminate fracture fragment lateral to the lunate and inferior to the triquetrum on frontal view with no definite triquetral fracture noted on the lateral view. Otherwise no definite acute displaced fracture. No dislocation. Degenerative changes of the lunate bone. There is no evidence of severe arthropathy or other focal bone abnormality. Soft tissues are unremarkable. IMPRESSION: Query age-indeterminate fracture fragment lateral to the lunate and inferior to the triquetrum on frontal view with no definite triquetral fracture noted on the lateral view. Electronically Signed   By: Iven Finn M.D.   On: 12/11/2021 15:27    Review of Systems  Constitutional:  Negative for chills, diaphoresis and fever.  HENT:  Negative for ear discharge, ear pain, hearing loss and tinnitus.   Eyes:  Negative for photophobia and pain.  Respiratory:  Negative for cough and shortness of breath.   Cardiovascular:  Negative for chest pain.  Gastrointestinal:  Negative for abdominal pain, nausea and vomiting.  Genitourinary:  Negative for dysuria, flank pain, frequency and urgency.  Musculoskeletal:  Positive for arthralgias (Right wrist). Negative for back pain, myalgias and neck pain.  Neurological:  Negative for dizziness and headaches.  Hematological:  Does not bruise/bleed easily.  Psychiatric/Behavioral:  The patient is not nervous/anxious.   Blood pressure 124/69, pulse 81, temperature 98.5 F (36.9 C), temperature source Oral, resp. rate 20, height 5\' 10"  (1.778 m), weight 121.1 kg, SpO2 100 %. Physical Exam Constitutional:      General: He is not in acute distress.     Appearance: He is well-developed. He is not diaphoretic.  HENT:     Head: Normocephalic and atraumatic.  Eyes:     General: No scleral icterus.       Right eye: No discharge.        Left eye: No discharge.     Conjunctiva/sclera: Conjunctivae normal.  Cardiovascular:     Rate and Rhythm: Normal rate and regular rhythm.  Pulmonary:     Effort: Pulmonary effort is normal. No respiratory distress.  Musculoskeletal:  Cervical back: Normal range of motion.     Comments: Right shoulder, elbow, wrist, digits- no skin wounds, mild lunotriquetral TTP, Finkelsteins negative, no instability, no blocks to motion  Sens  Ax/R/M/U intact  Mot   Ax/ R/ PIN/ M/ AIN/ U intact  Rad 2+  Skin:    General: Skin is warm and dry.  Neurological:     Mental Status: He is alert.  Psychiatric:        Mood and Affect: Mood normal.        Behavior: Behavior normal.    Assessment/Plan: Right wrist pain -- I think radiographic finding is likely old but even if acute would not change initial plan. Not worried about septic or crystal arthropathy. Will place in removable wrist splint, no restrictions for motion or activity. If he can tolerate NSAID's would suggest Naproxen 500mg  bid for 2 weeks. He may f/u with Dr. Lenon Curt in office once he is discharged.    Jeffrey Abu, PA-C Orthopedic Surgery [phone] 12/12/2021, 9:17 AM

## 2021-12-13 ENCOUNTER — Inpatient Hospital Stay (HOSPITAL_COMMUNITY): Payer: Medicare Other

## 2021-12-13 DIAGNOSIS — M79661 Pain in right lower leg: Secondary | ICD-10-CM

## 2021-12-13 DIAGNOSIS — M79604 Pain in right leg: Secondary | ICD-10-CM

## 2021-12-13 LAB — GLUCOSE, CAPILLARY
Glucose-Capillary: 109 mg/dL — ABNORMAL HIGH (ref 70–99)
Glucose-Capillary: 111 mg/dL — ABNORMAL HIGH (ref 70–99)
Glucose-Capillary: 117 mg/dL — ABNORMAL HIGH (ref 70–99)
Glucose-Capillary: 133 mg/dL — ABNORMAL HIGH (ref 70–99)
Glucose-Capillary: 158 mg/dL — ABNORMAL HIGH (ref 70–99)
Glucose-Capillary: 70 mg/dL (ref 70–99)
Glucose-Capillary: 90 mg/dL (ref 70–99)

## 2021-12-13 NOTE — Progress Notes (Signed)
NAME:  Jeffrey Mullen, MRN:  [id number], DOB:  [birth year], LOS: 20 ADMISSION DATE:  10/23/2021, CONSULTATION DATE:  10/31 REFERRING MD:  Dr. Dina Rich, CHIEF COMPLAINT:  AMS   History of Present Illness:  70 y/o M who presented to [facility] ER on 10/31 via EMS with reports of altered mental status.  Given  right sided gaze, altered and left sided weakness, code stoke called. The patient was taken emergently to CT for imaging which was negative for LVO.  While in the [place], he had a witnessed seizure.    PCCM called for ICU admission.   Chart review shows he was seen in the ER on 10/4 after passing out at the [employer].  Physical exam was negative at the time.  Labs negative.  No further imaging obtained.   Pertinent  Medical History  Alcohol Use - unclear quantity Anxiety / Depression  Congenital blindness of left eye, cataract on R Hepatitis C+, treated, better in 2016 Seizures   Significant Hospital Events: Including procedures, antibiotic start and stop dates in addition to other pertinent events   10/31 Admit with AMS, seizure, hypertensive emergency, AKI 11/1 MRI brain no acute intracranial process. No etiology is seen for the patient's seizure. 11/02 bedside LP attempted but unsuccessful  11/03  No seizures seen on EEG overnight  11/04 weaning sedation to assess for seizure reoccurrence  11/9 bronchoscopy with noted thick secretions in LLL 11/10 Left PICC placed 11/11 Trach,  Cor Track placed, New Embolic Strokes per MRI 97/35 Post Trach Day 1, some oozing from site, Echo results noted below 11/13 Low grade fever, tachy, LFT's remain elevated with ALT and Alk Phos continuing to rise.  S/p lasix  11/14 weaned PSV 10/5, no changes, foley removed 11/15 on ATC, TEE attempted- unable to pass probe, briefly on Neo, cortrak removed fort TEE, tmax 102.3 11/17 afebrile, NG on cultures, continues to have copious secretions 11/20 fever trend is down, on Ancef 11/22: only tolerating  PSV trials for 3 hours a day; broadened antibiotics to zosyn/vanc due to fever spike; recultured 12/9 MBS. Solid pharyngeal residue clears with a liquid wash, thin residue is mild except for one instance of sensed aspiration with thin and pill just before and after (due to backflow) the swallow. Pt had PMSV in place during testing and vocal quality and cough strength adequate to protect airway. He did have frequent coughing throughout testing not related to aspiration. Regular solids;Thin liquid.  12/9 changed trach to 6 cuffless. Starting capping trials  12/15 cap removed, suctioned several times overnight because he can't clear thick secretions, started doxycycline 12/19 trach capped again 12/20  decannulated   Interim History / Subjective:   Feeling well [name] has remained capped  Objective   Blood pressure 118/66, pulse 69, temperature 97.8 F (36.6 C), temperature source Oral, resp. rate 14, height '5\' 10"'  (1.778 m), weight 121.1 kg, SpO2 100 %.        Intake/Output Summary (Last 24 hours) at 12/13/2021 1258 Last data filed at 12/13/2021 0345 Gross per 24 hour  Intake 240 ml  Output 1400 ml  Net -1160 ml    Physical Exam:  General:  Resting comfortably in bed HENT: NCAT OP clear tracheostomy capped PULM: CTA B, normal effort CV: RRR, no mgr GI: BS+, soft, nontender MSK: normal bulk and tone Neuro: awake, alert, no distress, MAEW   Resolved Hospital Problem list   AKI due to contrast-induced ATN, resolved Nontraumatic rhabdomyolysis, resolved Non-anion Gap Metabolic Acidosis,  resolved Assessment & Plan:   Multifocal bilateral embolic stroke History of alcohol abuse Status epilepticus Sepsis S/p tracheostomy 11/11 Cirrhosis Hypertension Anemia critical illness  -DNR status  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

## 2021-12-13 NOTE — Plan of Care (Signed)
Pt is alert oriented x 4. Pt c/o pain to right wrist and hip. Prn tylenol given per order. No distress noted.     Problem: Education: Goal: Knowledge of General Education information will improve Description: Including pain rating scale, medication(s)/side effects and non-pharmacologic comfort measures Outcome: Progressing   Problem: Health Behavior/Discharge Planning: Goal: Ability to manage health-related needs will improve Outcome: Progressing   Problem: Clinical Measurements: Goal: Ability to maintain clinical measurements within normal limits will improve Outcome: Progressing Goal: Will remain free from infection Outcome: Progressing Goal: Diagnostic test results will improve Outcome: Progressing Goal: Respiratory complications will improve Outcome: Progressing Goal: Cardiovascular complication will be avoided Outcome: Progressing   Problem: Activity: Goal: Risk for activity intolerance will decrease Outcome: Progressing   Problem: Nutrition: Goal: Adequate nutrition will be maintained Outcome: Progressing   Problem: Coping: Goal: Level of anxiety will decrease Outcome: Progressing   Problem: Elimination: Goal: Will not experience complications related to bowel motility Outcome: Progressing Goal: Will not experience complications related to urinary retention Outcome: Progressing   Problem: Pain Managment: Goal: General experience of comfort will improve Outcome: Progressing   Problem: Safety: Goal: Ability to remain free from injury will improve Outcome: Progressing   Problem: Skin Integrity: Goal: Risk for impaired skin integrity will decrease Outcome: Progressing   Problem: Safety: Goal: Non-violent Restraint(s) Outcome: Progressing

## 2021-12-13 NOTE — Progress Notes (Signed)
Lower extremity venous RT study completed.  Preliminary results relayed to Sloan Leiter, MD via secure chat.  See CV Proc for preliminary results report.   Darlin Coco, RDMS, RVT

## 2021-12-13 NOTE — Progress Notes (Signed)
Physical Therapy Treatment Patient Details Name: Jeffrey Mullen MRN: [id number] DOB: March 07, 1951 Today's Date: 12/13/2021   History of Present Illness 70 y/o male presented to ED on 10/23/21 after bystanders witnessed patient collapse with associated seizure. Another seizure witnessed in ED. CT head negative. CT C-spine negative. LTM EEG on 11/1 showed 2 seizures from R anterior temporal region. Last seizure noted on EEG on 11/3. Intubated 10/31-11/11. Trach placed 11/11. Decannulated 12/20. 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    PT Comments    Pt received in supine, agreeable to therapy session "as long as I don't have to sit in the chair afterward" due to pain in bottom. Pt making good progress toward standing mobility goals, able to progress gait distance to 61ft with up to +2 minA and RW support. He needed a close chair follow and tended to sit impulsively due to R calf pain and fatigue, RN notified of pt r/o calf pain feeling worse with PROM dorsiflexion and weight bearing. Pt continues to benefit from PT services to progress toward functional mobility goals. Continue to recommend SNF.   Recommendations for follow up therapy are one component of a multi-disciplinary discharge planning process, led by the attending physician.  Recommendations may be updated based on patient status, additional functional criteria and insurance authorization.  Follow Up Recommendations  Skilled nursing-short term rehab (<3 hours/day)     Assistance Recommended at Discharge Frequent or constant Supervision/Assistance  Equipment Recommendations  Rolling walker (2 wheels);BSC/3in1;Other (comment)    Recommendations for Other Services Other (comment)     Precautions /  Restrictions Precautions Precautions: Fall Precaution Comments: trach (capped again 12/19; decannulated 12/20), L eye blindness, skin breakdown in peri area/sacral wounds Restrictions Weight Bearing Restrictions: No     Mobility  Bed Mobility Overal bed mobility: Needs Assistance Bed Mobility: Rolling;Sidelying to Sit Rolling: Supervision   Supine to sit: Min assist   Sit to sidelying: Min assist General bed mobility comments: used bed rail, min trunk assist to rise and minA for BLE assist to sidelying    Transfers Overall transfer level: Needs assistance Equipment used: Rolling walker (2 wheels) Transfers: Sit to/from Stand Sit to Stand: Min assist;+2 physical assistance;+2 safety/equipment           General transfer comment: minA +2 to power into standing, 3x    Ambulation/Gait Ambulation/Gait assistance: Min assist;+2 safety/equipment Gait Distance (Feet): 50 Feet (51ft, 26ft, 77ft with seated breaks between) Assistive device: Rolling walker (2 wheels) Gait Pattern/deviations: Step-to pattern;Step-through pattern;Decreased stride length;Decreased weight shift to right;Antalgic       General Gait Details: followed closely with recliner, frequent seated breaks needed suddenly      Modified Rankin (Stroke Patients Only) Modified Rankin (Stroke Patients Only) Pre-Morbid Rankin Score: No symptoms Modified Rankin: Moderately severe disability     Balance Overall balance assessment: Needs assistance Sitting-balance support: Feet supported;No upper extremity supported Sitting balance-Leahy Scale: Fair     Standing balance support: Single extremity supported;During functional activity Standing balance-Leahy Scale: Poor Standing balance comment: reliant on RW              Cognition Arousal/Alertness: Awake/alert Behavior During Therapy: WFL for tasks assessed/performed Overall Cognitive Status: No family/caregiver present to determine baseline cognitive  functioning Area of Impairment: Problem solving;Safety/judgement;Following commands  Current Attention Level: Selective   Following Commands: Follows one step commands inconsistently;Follows one step commands with increased time Safety/Judgement: Decreased awareness of safety;Decreased awareness of deficits Awareness: Intellectual   General Comments: pleasantly cooperative        Exercises Other Exercises Other Exercises: supine bridges x5 reps Other Exercises: AROM BLE : supine hip abduction, heel slides, ankle pumps x5 reps    General Comments General comments (skin integrity, edema, etc.): VSS on RA, HR WFL      Pertinent Vitals/Pain Pain Assessment: Faces Faces Pain Scale: Hurts even more Breathing: normal Negative Vocalization: none Facial Expression: smiling or inexpressive Body Language: relaxed Consolability: no need to console PAINAD Score: 0 Pain Location: R calf during ambulation Pain Descriptors / Indicators: Discomfort;Cramping;Sore Pain Intervention(s): Limited activity within patient's tolerance;Monitored during session;Repositioned;Patient requesting pain meds-RN notified     PT Goals (current goals can now be found in the care plan section) Acute Rehab PT Goals Patient Stated Goal: wants to walk today PT Goal Formulation: With patient Time For Goal Achievement: 12/26/20 Progress towards PT goals: Progressing toward goals    Frequency    Min 4X/week      PT Plan Current plan remains appropriate       AM-PAC PT "6 Clicks" Mobility   Outcome Measure  Help needed turning from your back to your side while in a flat bed without using bedrails?: A Little Help needed moving from lying on your back to sitting on the side of a flat bed without using bedrails?: A Little Help needed moving to and from a bed to a chair (including a wheelchair)?: A Lot Help needed standing up from a chair using your arms (e.g., wheelchair or  bedside chair)?: A Lot Help needed to walk in hospital room?: A Lot Help needed climbing 3-5 steps with a railing? : Total 6 Click Score: 13    End of Session Equipment Utilized During Treatment: Gait belt Activity Tolerance: Patient tolerated treatment well Patient left: with call bell/phone within reach;Other (comment);in bed;with bed alarm set;with SCD's reapplied (prevalon boots on, sidelying to L) Nurse Communication: Mobility status;Other (comment);Patient requests pain meds (R calf pain) PT Visit Diagnosis: Other abnormalities of gait and mobility (R26.89);Other symptoms and signs involving the nervous system (R29.898);Muscle weakness (generalized) (M62.81)     Time: 2458-0998 PT Time Calculation (min) (ACUTE ONLY): 27 min  Charges:  $Gait Training: 8-22 mins $Therapeutic Activity: 8-22 mins                     Loanne Emery P., PTA Acute Rehabilitation Services Pager: [phone] Office: [place] 12/13/2021, 12:12 PM

## 2021-12-13 NOTE — Progress Notes (Signed)
Dressing to buttock/ sacral area and left forearm changed per order.

## 2021-12-13 NOTE — Progress Notes (Signed)
[facility] PROGRESS NOTE  Jeffrey Mullen SJG:[id number] DOB: [birth year] DOA: 10/23/2021 PCP: [name], NP  Status: Remains inpatient appropriate because:  Unsafe discharge plan-rehabilitation facility  Barriers to discharge: Social: Patient unable to independently make decisions regarding medical care-guardianship process pending  Clinical: Decannulated on 12/20, currently tolerating oral diet although requires assistance with meal PT and OT continue to document significant physical deconditioning and requirement for physical therapy prior to being able to discharge to a private home environment  Level of care:  Progressive   Code Status: DNR Family Communication: Has no family available to assist.  Girlfriend also unable to assist with patient after discharge. DVT prophylaxis: Subcutaneous heparin COVID vaccination status: 02/18/2020, 04/25/2020 and 11/11/2020  HPI: 70 y/o M with past medical history of hypertension, glaucoma who presented to [facility] ER on 10/31 via EMS with reports of altered mental status.  Given  right sided gaze, altered and left sided weakness, code stoke called. The patient was taken emergently to CT for imaging which was negative for LVO.  While in the [place], he had a witnessed seizure.  PCCM called for ICU admission. Chart review shows he was seen in the ER on 10/4 after passing out at the [employer].  Physical exam was negative at the time.  Labs negative.  No further imaging obtained.    Subjective: Awake.  States he is glad the trach is out.  Made aware that he is now appropriate for discharge once facility can be located.  Objective: Vitals:   12/13/21 0728 12/13/21 0751  BP:  125/76  Pulse:  69  Resp:  14  Temp:    SpO2: 96% 100%    Intake/Output Summary (Last 24 hours) at 12/13/2021 0805 Last data filed at 12/13/2021 0345 Gross per 24 hour  Intake 240 ml  Output 1700 ml  Net -1460 ml    Filed Weights   11/17/21 0408  11/18/21 0423 12/09/21 0407  Weight: 77.3 kg 73.7 kg 121.1 kg    Exam:  Constitutional: NAD, calm Respiratory: Decannulated 12/20.  Dressing over neck clean dry and intact and no air leak noted.  O2 sats 100% on room air Cardiovascular: Regular rate, S1-S2.  No extremity edema.  Normotensive Abdomen: Soft and nontender with normoactive bowel sounds.  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 Sensation intact, Strength 4/5 on left, marked decrease strength on right: Arm is 3/5 and leg is 3+/5 Psychiatric: Alert and awake.  Oriented x3  Assessment/Plan: Acute problems: Acute multifocal bilateral embolic stroke TTE: No embolic source was identified.  TEE was attempted but could not be performed successfully. LDL 66.  HbA1c 6.0.  Continue aspirin and statin Noted to be quite deconditioned.   Acute right wrist pain 2/2 age-indeterminate fracture No obvious injury but clearly has decreased range of motion especially with flexion and flexion elicits a pain response X-ray wrist demonstrates age-indeterminate fracture-fragment lateral to the lunate and inferior to the triquetrum Orthopedics consultation Toradol IV x1 dose given 12/19  Right calf pain Probably musculoskeletal in etiology in context of profound deconditioning and ongoing physical therapy needs As a precaution we will check venous duplex to rule out DVT although patient has been on DVT prophylaxis with heparin since admission  Alcoholic liver cirrhosis PCP notes document history of alcohol abuse and known cirrhosis LFTS variable but typically normal to only mildly elevated w/ normal TB Past two readings AST 70-80 range and ALT in the 150s Will  need to repeat LFTs prior to discharge Statin and and Bactrim discontinued   Status epilepticus Resolved.   Continue Keppra and lacosamide.     Acute metabolic encephalopathy Resolved   Sepsis likely secondary to tracheitis Trach cultures were positive for MSSA and completed all antibiotics by 11/28 Repeat sputum culture on 12 /1 MSSA -due to resistance pattern Bactrim initiated but dc'd on D # 6 (12/12) 2/2 transaminitis 12/15 PCCM documented patient w/ increased secretions requiring more frequent suctioning and suspected another episode of acute tracheobronchitis.  Doxycycline given x5 days  Tracheostomy dependent Decannulated as of 12/20  Dysphagia Stable on soft diet with thin liquids   Left cephalic vein superficial thrombophlebitis/left arm extravasation injury (dorsal surface) Status post phentolamine and nitroglycerin application.   Continue local wound /hydrotherapy noting significant improvement in appearance of the wound as of 12/7 Hydrotherapy discontinued on 12/14      10/23/2021                  11/20/2021     11/29/2021                  12/06/2021  Hypernatremia/high volume stool output Rectal tube discontinued 12/13 Sodium 129 on 12/11  Physical deconditioning SNF for rehab Patient reporting dizziness with activity yesterday.  Today 12/20 PT plans on obtaining orthostatic vital signs   Essential hypertension/grade 2 diastolic dysfunction Continue amlodipine, carvedilol  Anemia of critical illness Hemoglobin 10.7 on 11/28   Hypokalemia Resolved with potassium 4.2 as of 11/29  Acute kidney injury due to contrast-induced ATN Resolved.   Current creatinine 0.86  Nontraumatic rhabdomyolysis Resolved   Goals of care Former significant other requested that she no longer wanted to be the surrogate decision maker.  Previously his biological daughter has also mentioned that she does not want to be the primary decision maker.  Social worker to help conduct  search for alternative family members/next of kin. After discussions with critical care medicine and with two-physician documentation patient's CODE STATUS was changed to DNR.  See note from 11/27.    Data Reviewed: Basic Metabolic Panel: No results for input(s): NA, K, CL, CO2, GLUCOSE, BUN, CREATININE, CALCIUM, MG, PHOS in the last 168 hours.  Liver Function Tests: No results for input(s): AST, ALT, ALKPHOS, BILITOT, PROT, ALBUMIN in the last 168 hours.   CBC: No results for input(s): WBC, NEUTROABS, HGB, HCT, MCV, PLT in the last 168 hours.   CBG: Recent Labs  Lab 12/12/21 1142 12/12/21 1619 12/12/21 1944 12/12/21 2334 12/13/21 0335  GLUCAP 106* 109* 113* 150* 70    No results found for this or any previous visit (from the past 240 hour(s)).     Scheduled Meds:  amLODipine  10 mg Oral Daily   arformoterol  15 mcg Nebulization BID   aspirin  81 mg Oral Daily   carvedilol  12.5 mg Oral BID WC   chlorhexidine  15 mL Mouth Rinse BID   collagenase   Topical BID   dorzolamide-timolol  1 drop Right Eye BID   feeding supplement  1 Container Oral TID BM   fiber  1 packet Oral BID   folic acid  1 mg Oral Daily   guaiFENesin  1,200 mg Oral BID   heparin  5,000  Units Subcutaneous Q8H   insulin aspart  0-15 Units Subcutaneous TID WC   lacosamide  200 mg Oral BID   latanoprost  1 drop Right Eye QHS   levETIRAcetam  500 mg Oral BID   mouth rinse  15 mL Mouth Rinse q12n4p   multivitamin with minerals  1 tablet Oral Daily   nicotine  14 mg Transdermal Daily   pantoprazole  40 mg Oral Daily   revefenacin  175 mcg Nebulization Daily   saccharomyces boulardii  250 mg Oral BID   scopolamine  1 patch Transdermal Q72H   sodium chloride flush  10-40 mL Intracatheter Q12H   thiamine  100 mg Oral Daily   Continuous Infusions:  sodium chloride Stopped (11/07/21 0016)   sodium chloride 5 mL/hr at 11/19/21 0949    Active Problems:   Status epilepticus ([place])   Acute respiratory  failure with hypercapnia (HCC)   Acute encephalopathy   Cryptogenic stroke (HCC)   Pressure injury of skin   AKI (acute kidney injury) ([place])   Seizure ([place])   Acute respiratory failure with hypoxia (HCC)   Status post tracheostomy ([place])   Hypokalemia   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)   Oropharyngeal dysphagia   Transaminitis   Tracheostomy care ([place])   Tracheobronchitis   Decreased range of motion of right wrist    Consultants: Neurology PCCM Nephrology  Procedures: EEG Echocardiogram a bubble study Core track Tracheostomy Lumbar puncture in interventional radiology  Antibiotics: Acyclovir 11/1 through 11/4 Azithromycin 11/4 through 11/7 Ceftriaxone 11/4 through 11/7 Linezolid 11/4 through 11/5 Unasyn 11/8 through 11/11 Ceftriaxone 11/11 through 11/12 Zosyn 11/17 through 11/19 Cefazolin 11/19 through 11/21 Zosyn 11/22 through 11/28 Vancomycin 11/22 through 11/28   Time spent: 30 minutes    Erin Hearing ANP  [facility] 7 am - 330 pm/M-F for direct patient care and secure chat Please refer to [place] for contact info 51  days

## 2021-12-13 NOTE — TOC Progression Note (Addendum)
Transition of Care [facility]) - Progression Note    Patient Details  Name: Jeffrey Mullen MRN: [id number] Date of Birth: 1950/12/29  Transition [facility] CM/SW Marie, RN Phone Number: 12/13/2021, 2:22 PM  Clinical Narrative:    CM called and spoke with Jackelyn Poling, [place] at [place] facility and the facility is able to offer an admission bed to the patient at the facility tomorrow.  Insurance Authorization has been approved through [employer] and the patient has available Medicaid as well for placement.  COVID screen was ordered and bedside nursing will be notified to screen the patient today.  The patient will be set up for transport to [place] by [facility] tomorrow.  CM called [facility] and scheduled transportation for 1100 tomorrow via ambulance.  [facility] will be provided with a medical necessity form, discharge summary, AVS and DNR for transport to the facility.  Bedside nursing was notified.  CM called and left a message with Chauncy Lean, [place] with APS to notify that the patient is alert and oriented and should be removed from [facility] court docket schedule for Interim guardianship.  I left a message on her cell phone with my contact information for follow up.  CM and MSW with DTP Team will continue to follow the patient for discharge to [facility] tomorrow, 12/87/8676.     Expected Discharge Plan: [place] Barriers to Discharge: Continued Medical Work up  Expected Discharge Plan and Services Expected Discharge Plan: [place]   Discharge Planning Services: CM Consult Post Acute Care Choice: [place] Living arrangements for the past 2 months: Apartment                                       Social Determinants of Health (SDOH) Interventions    Readmission Risk Interventions No flowsheet data found.

## 2021-12-13 NOTE — Progress Notes (Addendum)
2pm: Patient was offered a bed at [facility] and can be discharged there tomorrow.  CSW call [employer] to update the facility on auth request.  10:45am: Per patient, he lost his apartment and has no where to return to at discharge so he will require LTC. There are no long term beds available at [facility].   Clinicals sent to additional facilities for review.  9:40am: Patient's insurance authorization request was approved. Next review date is 12/15/21.   Juliann Pulse will come visit patient prior to determining his room assignment.  8:45am: CSW spoke with patient at bedside to present him with bed offer from [facility]. He accepted bed offer and all questions were answered.  CSW spoke with Juliann Pulse at [employer] to inform her of information.   Per NP, patient is medically stable for discharge. CSW will initiate insurance authorization.  Madilyn Fireman, MSW, LCSW Transitions of Care   Clinical Social Worker II [phone]

## 2021-12-14 DIAGNOSIS — R262 Difficulty in walking, not elsewhere classified: Secondary | ICD-10-CM | POA: Diagnosis not present

## 2021-12-14 DIAGNOSIS — L89153 Pressure ulcer of sacral region, stage 3: Secondary | ICD-10-CM | POA: Diagnosis not present

## 2021-12-14 DIAGNOSIS — I70221 Atherosclerosis of native arteries of extremities with rest pain, right leg: Secondary | ICD-10-CM | POA: Diagnosis not present

## 2021-12-14 DIAGNOSIS — M19071 Primary osteoarthritis, right ankle and foot: Secondary | ICD-10-CM | POA: Diagnosis not present

## 2021-12-14 DIAGNOSIS — M19011 Primary osteoarthritis, right shoulder: Secondary | ICD-10-CM | POA: Diagnosis not present

## 2021-12-14 DIAGNOSIS — I10 Essential (primary) hypertension: Secondary | ICD-10-CM | POA: Diagnosis not present

## 2021-12-14 DIAGNOSIS — L89323 Pressure ulcer of left buttock, stage 3: Secondary | ICD-10-CM | POA: Diagnosis not present

## 2021-12-14 DIAGNOSIS — R6889 Other general symptoms and signs: Secondary | ICD-10-CM | POA: Diagnosis not present

## 2021-12-14 DIAGNOSIS — J40 Bronchitis, not specified as acute or chronic: Secondary | ICD-10-CM | POA: Diagnosis not present

## 2021-12-14 DIAGNOSIS — J9602 Acute respiratory failure with hypercapnia: Secondary | ICD-10-CM | POA: Diagnosis not present

## 2021-12-14 DIAGNOSIS — J302 Other seasonal allergic rhinitis: Secondary | ICD-10-CM | POA: Diagnosis not present

## 2021-12-14 DIAGNOSIS — I639 Cerebral infarction, unspecified: Secondary | ICD-10-CM | POA: Diagnosis not present

## 2021-12-14 DIAGNOSIS — M1711 Unilateral primary osteoarthritis, right knee: Secondary | ICD-10-CM | POA: Diagnosis not present

## 2021-12-14 DIAGNOSIS — Z430 Encounter for attention to tracheostomy: Secondary | ICD-10-CM | POA: Diagnosis not present

## 2021-12-14 DIAGNOSIS — R52 Pain, unspecified: Secondary | ICD-10-CM | POA: Diagnosis not present

## 2021-12-14 DIAGNOSIS — S61402A Unspecified open wound of left hand, initial encounter: Secondary | ICD-10-CM | POA: Diagnosis not present

## 2021-12-14 DIAGNOSIS — M6281 Muscle weakness (generalized): Secondary | ICD-10-CM | POA: Diagnosis not present

## 2021-12-14 DIAGNOSIS — Z7409 Other reduced mobility: Secondary | ICD-10-CM | POA: Diagnosis not present

## 2021-12-14 DIAGNOSIS — Z79899 Other long term (current) drug therapy: Secondary | ICD-10-CM | POA: Diagnosis not present

## 2021-12-14 DIAGNOSIS — I6340 Cerebral infarction due to embolism of unspecified cerebral artery: Secondary | ICD-10-CM | POA: Diagnosis not present

## 2021-12-14 DIAGNOSIS — R531 Weakness: Secondary | ICD-10-CM | POA: Diagnosis not present

## 2021-12-14 DIAGNOSIS — R1310 Dysphagia, unspecified: Secondary | ICD-10-CM | POA: Diagnosis not present

## 2021-12-14 DIAGNOSIS — E119 Type 2 diabetes mellitus without complications: Secondary | ICD-10-CM | POA: Diagnosis not present

## 2021-12-14 DIAGNOSIS — L89623 Pressure ulcer of left heel, stage 3: Secondary | ICD-10-CM | POA: Diagnosis not present

## 2021-12-14 DIAGNOSIS — M25511 Pain in right shoulder: Secondary | ICD-10-CM | POA: Diagnosis not present

## 2021-12-14 DIAGNOSIS — T80818A Extravasation of other vesicant agent, initial encounter: Secondary | ICD-10-CM | POA: Diagnosis not present

## 2021-12-14 DIAGNOSIS — R2689 Other abnormalities of gait and mobility: Secondary | ICD-10-CM | POA: Diagnosis not present

## 2021-12-14 DIAGNOSIS — W19XXXD Unspecified fall, subsequent encounter: Secondary | ICD-10-CM | POA: Diagnosis not present

## 2021-12-14 DIAGNOSIS — R5381 Other malaise: Secondary | ICD-10-CM | POA: Diagnosis not present

## 2021-12-14 DIAGNOSIS — G9340 Encephalopathy, unspecified: Secondary | ICD-10-CM | POA: Diagnosis not present

## 2021-12-14 DIAGNOSIS — Z743 Need for continuous supervision: Secondary | ICD-10-CM | POA: Diagnosis not present

## 2021-12-14 DIAGNOSIS — J9601 Acute respiratory failure with hypoxia: Secondary | ICD-10-CM | POA: Diagnosis not present

## 2021-12-14 DIAGNOSIS — N179 Acute kidney failure, unspecified: Secondary | ICD-10-CM | POA: Diagnosis not present

## 2021-12-14 DIAGNOSIS — L89622 Pressure ulcer of left heel, stage 2: Secondary | ICD-10-CM | POA: Diagnosis not present

## 2021-12-14 DIAGNOSIS — I739 Peripheral vascular disease, unspecified: Secondary | ICD-10-CM | POA: Diagnosis not present

## 2021-12-14 DIAGNOSIS — F32A Depression, unspecified: Secondary | ICD-10-CM | POA: Diagnosis not present

## 2021-12-14 DIAGNOSIS — K219 Gastro-esophageal reflux disease without esophagitis: Secondary | ICD-10-CM | POA: Diagnosis not present

## 2021-12-14 DIAGNOSIS — G40901 Epilepsy, unspecified, not intractable, with status epilepticus: Secondary | ICD-10-CM | POA: Diagnosis not present

## 2021-12-14 LAB — GLUCOSE, CAPILLARY
Glucose-Capillary: 156 mg/dL — ABNORMAL HIGH (ref 70–99)
Glucose-Capillary: 126 mg/dL — ABNORMAL HIGH (ref 70–99)
Glucose-Capillary: 85 mg/dL (ref 70–99)
Glucose-Capillary: 95 mg/dL (ref 70–99)
Glucose-Capillary: 121 mg/dL — ABNORMAL HIGH (ref 70–99)

## 2021-12-14 LAB — SARS CORONAVIRUS 2 (TAT 6-24 HRS): SARS Coronavirus 2: NEGATIVE

## 2021-12-14 MED ORDER — SACCHAROMYCES BOULARDII 250 MG PO CAPS: 250.0000 mg | ORAL_CAPSULE | Freq: Two times a day (BID) | ORAL | Status: DC

## 2021-12-14 MED ORDER — DOCUSATE SODIUM 100 MG PO CAPS: 100.0000 mg | ORAL_CAPSULE | Freq: Every day | ORAL | 0 refills | Status: DC | PRN

## 2021-12-14 MED ORDER — FOLIC ACID 1 MG PO TABS: 1.0000 mg | ORAL_TABLET | Freq: Every day | ORAL | Status: DC

## 2021-12-14 MED ORDER — ARFORMOTEROL TARTRATE 15 MCG/2ML IN NEBU: 15.0000 ug | INHALATION_SOLUTION | Freq: Two times a day (BID) | RESPIRATORY_TRACT | Status: DC

## 2021-12-14 MED ORDER — LOPERAMIDE HCL 2 MG PO CAPS
4.0000 mg | ORAL_CAPSULE | ORAL | 0 refills | Status: DC | PRN
Start: 2021-12-14 — End: 2022-04-13

## 2021-12-14 MED ORDER — NAPROXEN 500 MG PO TABS: 500.0000 mg | ORAL_TABLET | Freq: Two times a day (BID) | ORAL | Status: AC

## 2021-12-14 MED ORDER — INSULIN ASPART 100 UNIT/ML IJ SOLN
0.0000 [IU] | Freq: Three times a day (TID) | INTRAMUSCULAR | 11 refills | Status: DC
Start: 2021-12-14 — End: 2022-04-04

## 2021-12-14 MED ORDER — NICOTINE 14 MG/24HR TD PT24: 14.0000 mg | MEDICATED_PATCH | Freq: Every day | TRANSDERMAL | 0 refills | Status: DC

## 2021-12-14 MED ORDER — POLYETHYLENE GLYCOL 3350 17 G PO PACK: 17.0000 g | PACK | Freq: Every day | ORAL | 0 refills | Status: DC | PRN

## 2021-12-14 MED ORDER — IPRATROPIUM-ALBUTEROL 0.5-2.5 (3) MG/3ML IN SOLN
3.0000 mL | RESPIRATORY_TRACT | Status: DC | PRN
Start: 2021-12-14 — End: 2022-06-18

## 2021-12-14 MED ORDER — LEVETIRACETAM 100 MG/ML PO SOLN: 500.0000 mg | Freq: Two times a day (BID) | ORAL | 12 refills | Status: DC

## 2021-12-14 MED ORDER — NUTRISOURCE FIBER PO PACK
1.0000 | PACK | Freq: Two times a day (BID) | ORAL | Status: DC
Start: 2021-12-14 — End: 2022-06-18

## 2021-12-14 MED ORDER — REVEFENACIN 175 MCG/3ML IN SOLN: 175.0000 ug | Freq: Every day | RESPIRATORY_TRACT | Status: DC

## 2021-12-14 MED ORDER — ASPIRIN 81 MG PO CHEW: 81.0000 mg | CHEWABLE_TABLET | Freq: Every day | ORAL | Status: DC

## 2021-12-14 MED ORDER — NAPROXEN 250 MG PO TABS
500.0000 mg | ORAL_TABLET | Freq: Two times a day (BID) | ORAL | Status: DC
  Filled 2021-12-14: qty 2

## 2021-12-14 MED ORDER — ADULT MULTIVITAMIN W/MINERALS CH: 1.0000 | ORAL_TABLET | Freq: Every day | ORAL | Status: DC

## 2021-12-14 MED ORDER — PANTOPRAZOLE SODIUM 40 MG PO TBEC
40.0000 mg | DELAYED_RELEASE_TABLET | Freq: Every day | ORAL | Status: DC
Start: 2021-12-14 — End: 2022-04-04

## 2021-12-14 MED ORDER — ACETAMINOPHEN 325 MG PO TABS
650.0000 mg | ORAL_TABLET | ORAL | Status: DC | PRN
Start: 2021-12-14 — End: 2022-06-18

## 2021-12-14 MED ORDER — COLLAGENASE 250 UNIT/GM EX OINT: TOPICAL_OINTMENT | Freq: Two times a day (BID) | CUTANEOUS | 0 refills | Status: DC

## 2021-12-14 MED ORDER — LACOSAMIDE 200 MG PO TABS: 200.0000 mg | ORAL_TABLET | Freq: Two times a day (BID) | ORAL | Status: DC

## 2021-12-14 MED ORDER — GUAIFENESIN ER 600 MG PO TB12: 1200.0000 mg | ORAL_TABLET | Freq: Two times a day (BID) | ORAL | Status: DC

## 2021-12-14 MED ORDER — CARVEDILOL 12.5 MG PO TABS
12.5000 mg | ORAL_TABLET | Freq: Two times a day (BID) | ORAL | Status: DC
Start: 2021-12-14 — End: 2022-04-04

## 2021-12-14 MED ORDER — ASPIRIN 81 MG PO CHEW: 81.0000 mg | CHEWABLE_TABLET | Freq: Every day | ORAL | 3 refills | Status: DC

## 2021-12-14 MED ORDER — THIAMINE HCL 100 MG PO TABS: 100.0000 mg | ORAL_TABLET | Freq: Every day | ORAL | Status: DC

## 2021-12-14 NOTE — Progress Notes (Signed)
Report given to Jeffrey Mullen at [facility] reflecting patient's current status.

## 2021-12-14 NOTE — Progress Notes (Addendum)
Patient will go to [facility] via [facility] at [id number]. He will go to room B23, bed #1. The number to call for report is [phone].  Madilyn Fireman, MSW, LCSW Transitions of Care   Clinical Social Worker II [phone]

## 2021-12-14 NOTE — Discharge Summary (Addendum)
Physician Discharge Summary  Jeffrey Mullen XTG:[id number] DOB: 1951/09/28 DOA: 10/23/2021  PCP: [name], NP  Admit date: 10/23/2021 Discharge date: 12/14/2021  Time spent: 45 minutes  Recommendations for Outpatient Follow-up:  Patient will need to follow-up with neurology at the stroke clinic/Dr. Leonie Man.  Please call and arrange an outpatient appointment for 1 to 2 weeks after discharge to your facility.  [phone] Ambulatory referral has been sent to [facility] pulmonology group for outpatient follow-up.  Patient recently decannulated from having a tracheostomy tube and PFTs were recommended by the inpatient pulmonologist. Patient will need to follow-up with orthopedic group after discharge to your facility.  An ambulatory referral has been sent. Patient has known PVD and continues to have right lower extremity claudication with ambulation.  Ambulatory referral sent to vascular surgery. He will discharge to [facility] in [place] to the skilled nursing facility Patient has had issues with transaminitis which appears to have been worsened recently by side effects from Bactrim.  Patient will need follow-up LFTs as well as electrolyte panel in 2 weeks after arrival to your facility On date of discharge at recommendation of orthopedic team patient has been started on scheduled Naprosyn for total of 14 days   Discharge Diagnoses:  Active Problems:   Status epilepticus (HCC)   Acute respiratory failure with hypercapnia (HCC)   Acute encephalopathy   Cryptogenic stroke (HCC)   Pressure injury of skin   AKI (acute kidney injury) ([place])   Seizure (HCC)   Acute respiratory failure with hypoxia (HCC)   Status post tracheostomy ([place])   Hypokalemia   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)   Oropharyngeal dysphagia   Transaminitis   Tracheostomy care ([place])   Tracheobronchitis   Decreased range of motion of right wrist   Right calf pain   Discharge Condition:  Stable  Diet recommendation: Soft diet with thin liquids  Filed Weights   11/17/21 0408 11/18/21 0423 12/09/21 0407  Weight: 77.3 kg 73.7 kg 121.1 kg    History of present illness:  70 y/o M with past medical history of hypertension, glaucoma who presented to [facility] ER on 10/31 via EMS with reports of altered mental status.  Given  right sided gaze, altered and left sided weakness, code stoke called. The patient was taken emergently to CT for imaging which was negative for LVO.  While in the [facility], he had a witnessed seizure.  PCCM called for ICU admission. Chart review shows he was seen in the ER on 10/4 after passing out at the [employer].  Physical exam was negative at the time.  Labs negative.  No further imaging obtained.   Hospital Course:  Acute multifocal bilateral embolic stroke TTE: No embolic source was identified.  TEE was attempted but could not be performed successfully. LDL 66.  HbA1c 6.0.   Continue aspirin -statin on hold secondary to transaminitis Noted to be quite deconditioned.    Acute right wrist pain 2/2 age-indeterminate fracture No obvious injury but clearly has decreased range of motion especially with flexion and flexion elicits a pain response X-ray wrist demonstrates age-indeterminate fracture-fragment lateral to the lunate and inferior to the triquetrum Orthopedics consultation Toradol IV x1 dose given 12/19.  Therapeutic service recommending starting Naprosyn 500 mg BID 10 days.  This was initiated on date of discharge   Right calf pain/Known PVD Venous duplex unremarkable except for incidental finding of known SFA occlusion/significant stenosis with reconstitution at collaterals. Ambulatory referral has been sent to vascular  surgery for outpatient follow-up   Alcoholic liver cirrhosis PCP notes document history of alcohol abuse and known cirrhosis LFTS variable but typically normal to only mildly elevated w/ normal TB Past two readings AST 70-80 range  and ALT in the 150s-commend repeat LFTs 2 weeks after arrival to facility Statin and and Bactrim discontinued   Status epilepticus Resolved.   Continue Keppra and lacosamide.     Acute metabolic encephalopathy Resolved   Sepsis likely secondary to tracheitis Trach cultures were positive for MSSA and completed all antibiotics by 11/28 Repeat sputum culture on 12 /1 MSSA -due to resistance pattern Bactrim initiated but dc'd on D # 6 (12/12) 2/2 transaminitis 12/15 PCCM documented patient w/ increased secretions requiring more frequent suctioning and suspected another episode of acute tracheobronchitis.  Doxycycline given x 3 days per pulmonary medicine Has subsequently been decannulated   Tracheostomy dependent Decannulated as of 12/20   Dysphagia Stable on soft diet with thin liquids   Left cephalic vein superficial thrombophlebitis/left arm extravasation injury (dorsal surface) Status post phentolamine and nitroglycerin application.   Continue local wound /hydrotherapy noting significant improvement in appearance of the wound as of 12/7 Hydrotherapy discontinued on 12/14-see wound care orders       10/23/2021                  11/20/2021      11/29/2021                  12/06/2021   Hypernatremia/high volume stool output Rectal tube discontinued 12/13 Sodium 129 on 12/11   Physical deconditioning SNF for rehab   Essential hypertension/grade 2 diastolic dysfunction Continue amlodipine, carvedilol  Anemia of critical illness Hemoglobin 10.7 on 11/28   Hypokalemia Resolved with potassium 4.2 as of 11/29  Acute kidney injury due to contrast-induced ATN Resolved.   Current creatinine 0.86  Nontraumatic rhabdomyolysis Resolved   Goals of care Former significant other requested that she no longer wanted to be the surrogate decision maker.  Previously his biological daughter has also mentioned that she does not want to be the primary decision maker.  Social worker to  help conduct search for alternative family members/next of kin. After discussions with critical care medicine and with two-physician documentation patient's CODE STATUS was changed to DNR.  See note from 11/27.  Procedures: EEG Echocardiogram a bubble study Core track Tracheostomy Lumbar puncture in interventional radiology Venous duplex right lower extremity  Consultations: Neurology PCCM Nephrology  Antibiotics: Acyclovir 11/1 through 11/4 Azithromycin 11/4 through 11/7 Ceftriaxone 11/4 through 11/7 Linezolid 11/4 through 11/5 Unasyn 11/8 through 11/11 Ceftriaxone 11/11 through 11/12 Zosyn 11/17 through 11/19 Cefazolin 11/19 through 11/21 Zosyn 11/22 through 11/28 Vancomycin 11/22 through 11/28 Bactrim 12/6 through 12/11 Doxycycline 12/15 through 12/70  Discharge Exam: Vitals:   12/14/21 0822 12/14/21 1124  BP:  (!) 151/74  Pulse:  68  Resp:  14  Temp:  97.9 F (36.6 C)  SpO2: 97% 100%      Discharge Instructions   Discharge Instructions     AMB referral to orthopedics   Complete by: As directed    Seen in consultation by M. Dellis Filbert, PA-C with recommendation to FU with Dr Lenon Curt 2 weeks after DC. Pt will DC to [facility] 69/62   Ambulatory referral to Neurology   Complete by: As directed    Follow up with Dr. Leonie Man at [facility] in 4-6 weeks after discharge. Too complicated for RN to follow. Thanks.   Ambulatory referral to  Pulmonology   Complete by: As directed    Pt seen during hospitalization. Dr. Lake Bells recommended PFTs after DC. Pt dc'ng to [facility] 16/10   Reason for referral: Asthma/COPD   Ambulatory referral to Vascular Surgery   Complete by: As directed    Pt with known PVD and ongoing claudication RLE- dc to [facility] SNF 96/04   Diet general   Complete by: As directed    Soft diet with thin liquids   Discharge instructions   Complete by: As directed    Continue PT and OT to maximize mobility.  Patient severely deconditioned with a degree of  critical illness myopathy contributing.  Utilize previously recommended mobility/assistive devices such as rolling walker and/or wheelchair  Continue right Velcro cock up splint to right wrist until patient reevaluated by orthopedic team  See wound care   Patient will need follow-up electrolyte panel to include LFTs in 2 weeks  Continue protein supplemental beverage.  Patient was previously on resource beverage at the facility but bruised formula, Ensure or Glucerna can be substituted   Discharge wound care:   Complete by: As directed    1) apply Santyl to left buttock/sacrum and cover with foam dressing.  Change dressing every 3 days or as needed for soiling  2) left forearm wound care: Cleanse with normal saline.  Apply Xerofoam gauze followed by thin layer of 4 x 4's.  Wrap with Kerlix and change this dressing twice daily   Increase activity slowly   Complete by: As directed       Allergies as of 12/14/2021   No Active Allergies      Medication List     STOP taking these medications    atorvastatin 40 MG tablet Commonly known as: Lipitor   GLUCOSAMINE CHONDR COMPLEX PO   ibuprofen 800 MG tablet Commonly known as: ADVIL   lisinopril-hydrochlorothiazide 20-25 MG tablet Commonly known as: ZESTORETIC   loratadine 10 MG tablet Commonly known as: CLARITIN   mupirocin ointment 2 % Commonly known as: Garment/textile technologist   sildenafil 100 MG tablet Commonly known as: Viagra   vitamin C 500 MG tablet Commonly known as: ASCORBIC ACID       TAKE these medications    acetaminophen 325 MG tablet Commonly known as: TYLENOL Take 2 tablets (650 mg total) by mouth every 4 (four) hours as needed for fever or mild pain.   amLODipine 10 MG tablet Commonly known as: NORVASC Take 1 tablet (10 mg total) by mouth daily.   arformoterol 15 MCG/2ML Nebu Commonly known as: BROVANA Take 2 mLs (15 mcg total) by nebulization 2 (two) times daily.   aspirin 81 MG  chewable tablet Chew 1 tablet (81 mg total) by mouth daily. Start taking on: December 15, 2021   atropine 1 % ophthalmic solution Place 1 drop into the left eye 2 (two) times daily.   bimatoprost 0.01 % Soln Commonly known as: LUMIGAN Place 1 drop into the right eye at bedtime.   busPIRone 15 MG tablet Commonly known as: BUSPAR TAKE ONE TABLET BY MOUTH THREE TIMES A DAY What changed:  when to take this reasons to take this   carvedilol 12.5 MG tablet Commonly known as: COREG Take 1 tablet (12.5 mg total) by mouth 2 (two) times daily with a meal.   collagenase ointment Commonly known as: SANTYL Apply topically 2 (two) times daily.   docusate sodium 100 MG capsule Commonly known as: COLACE Take 1 capsule (100 mg  total) by mouth daily as needed for mild constipation.   dorzolamide-timolol 22.3-6.8 MG/ML ophthalmic solution Commonly known as: COSOPT Place 1 drop into the right eye 2 (two) times daily.   fiber Pack packet Take 1 packet by mouth 2 (two) times daily.   folic acid 1 MG tablet Commonly known as: FOLVITE Take 1 tablet (1 mg total) by mouth daily.   guaiFENesin 600 MG 12 hr tablet Commonly known as: MUCINEX Take 2 tablets (1,200 mg total) by mouth 2 (two) times daily.   insulin aspart 100 UNIT/ML injection Commonly known as: novoLOG Inject 0-15 Units into the skin 3 (three) times daily with meals. Do NOT hold insulin if patient is NPO. Moderate Scale.   ipratropium-albuterol 0.5-2.5 (3) MG/3ML Soln Commonly known as: DUONEB Take 3 mLs by nebulization every 4 (four) hours as needed.   lacosamide 200 MG Tabs tablet Commonly known as: VIMPAT Take 1 tablet (200 mg total) by mouth 2 (two) times daily.   levETIRAcetam 100 MG/ML solution Commonly known as: KEPPRA Take 5 mLs (500 mg total) by mouth 2 (two) times daily.   loperamide 2 MG capsule Commonly known as: IMODIUM Take 2 capsules (4 mg total) by mouth as needed for diarrhea or loose stools.    multivitamin with minerals Tabs tablet Take 1 tablet by mouth daily.   naproxen 500 MG tablet Commonly known as: NAPROSYN Take 1 tablet (500 mg total) by mouth 2 (two) times daily with a meal for 14 days.   nicotine 14 mg/24hr patch Commonly known as: NICODERM CQ - dosed in mg/24 hours Place 1 patch (14 mg total) onto the skin daily.   pantoprazole 40 MG tablet Commonly known as: PROTONIX Take 1 tablet (40 mg total) by mouth daily.   polyethylene glycol 17 g packet Commonly known as: MIRALAX / GLYCOLAX Place 17 g into feeding tube daily as needed for moderate constipation.   revefenacin 175 MCG/3ML nebulizer solution Commonly known as: YUPELRI Take 3 mLs (175 mcg total) by nebulization daily. Start taking on: December 15, 2021   saccharomyces boulardii 250 MG capsule Commonly known as: FLORASTOR Take 1 capsule (250 mg total) by mouth 2 (two) times daily.   tacrolimus 0.1 % ointment Commonly known as: PROTOPIC APPLY TO FACE NIGHTLY What changed: See the new instructions.   thiamine 100 MG tablet Take 1 tablet (100 mg total) by mouth daily.   triamcinolone 0.025 % ointment Commonly known as: KENALOG APPLY ONE APPLICATION TOPICALLY TWICE A DAY What changed: See the new instructions.   Vitamin D 50 MCG (2000 UT) Caps Take 2,000 Units by mouth daily.               Discharge Care Instructions  (From admission, onward)           Start     Ordered   12/14/21 0000  Discharge wound care:       Comments: 1) apply Santyl to left buttock/sacrum and cover with foam dressing.  Change dressing every 3 days or as needed for soiling  2) left forearm wound care: Cleanse with normal saline.  Apply Xerofoam gauze followed by thin layer of 4 x 4's.  Wrap with Kerlix and change this dressing twice daily   12/14/21 0843           No Active Allergies  Contact information for follow-up providers     Garvin Fila, MD. Schedule an appointment as soon as possible for  a visit in 1 month(s).  Specialties: Neurology, Radiology Why: stroke clinic Contact information: [address] [place] [address] [phone]         [name], NP. Schedule an appointment as soon as possible for a visit.   Specialty: Nurse Practitioner Why: Please follow up with primary care physician. Contact information: [place] [address] [phone]              Contact information for after-discharge care     [facility] Follow up.   Service: Skilled Nursing Contact information: [address] [place] [phone]                      The results of significant diagnostics from this hospitalization (including imaging, microbiology, ancillary and laboratory) are listed below for reference.    Significant Diagnostic Studies: DG Wrist Complete Right  Result Date: 12/11/2021 CLINICAL DATA:  decreased ROM on right wrist. Pt denies any pain on left wrist. EXAM: RIGHT WRIST - COMPLETE 3+ VIEW COMPARISON:  None. FINDINGS: Query age-indeterminate fracture fragment lateral to the lunate and inferior to the triquetrum on frontal view with no definite triquetral fracture noted on the lateral view. Otherwise no definite acute displaced fracture. No dislocation. Degenerative changes of the lunate bone. There is no evidence of severe arthropathy or other focal bone abnormality. Soft tissues are unremarkable. IMPRESSION: Query age-indeterminate fracture fragment lateral to the lunate and inferior to the triquetrum on frontal view with no definite triquetral fracture noted on the lateral view. Electronically Signed   By: Iven Finn M.D.   On: 12/11/2021 15:27   DG CHEST PORT 1 VIEW  Result Date: 12/06/2021 CLINICAL DATA:  Cough, shortness of breath, tracheostomy EXAM: PORTABLE CHEST 1 VIEW COMPARISON:  11/27/2021 FINDINGS: The heart size and  mediastinal contours are within normal limits. Tracheostomy. Both lungs are clear. The visualized skeletal structures are unremarkable. IMPRESSION: No acute abnormality of the lungs in AP portable projection. Electronically Signed   By: Delanna Ahmadi M.D.   On: 12/06/2021 13:16   DG CHEST PORT 1 VIEW  Result Date: 11/27/2021 CLINICAL DATA:  Fever. EXAM: PORTABLE CHEST 1 VIEW COMPARISON:  Chest x-ray 11/16/2021. FINDINGS: Tip of the tracheostomy is at the level of the clavicular heads, unchanged. Enteric tube extends below the diaphragm. There is no focal lung infiltrate, pleural effusion or pneumothorax. Cardiomediastinal silhouette is within normal limits. No acute fractures are seen. IMPRESSION: No active disease. Electronically Signed   By: Ronney Asters M.D.   On: 11/27/2021 15:37   DG Chest Port 1 View  Result Date: 11/16/2021 CLINICAL DATA:  Acute respiratory failure. EXAM: PORTABLE CHEST 1 VIEW COMPARISON:  Chest x-ray dated November 13, 2021. FINDINGS: Interval removal of the left upper extremity PICC line. Unchanged tracheostomy and feeding tubes. The heart size and mediastinal contours are within normal limits. Normal pulmonary vascularity. Unchanged atelectasis/scarring in both lungs. No focal consolidation, pleural effusion, or pneumothorax. No acute osseous abnormality. IMPRESSION: 1.  No active cardiopulmonary disease. Electronically Signed   By: Titus Dubin M.D.   On: 11/16/2021 09:05   DG Swallowing Func-Speech Pathology  Result Date: 12/01/2021 Table formatting from the original result was not included. Objective Swallowing Evaluation: Type of Study: MBS-Modified Barium Swallow Study  Patient Details Name: SOHAIB VEREEN MRN: [id number] Date of Birth: [birth year] Today's Date: 12/01/2021 Time: SLP Start Time (ACUTE ONLY): 2979 -SLP Stop Time (ACUTE ONLY): 8921  SLP Time Calculation (min) (ACUTE ONLY): 20 min Past Medical History: Past Medical History: Diagnosis Date  Alcohol abuse    Allergy   Anemia   Anxiety   Arthritis   Blind left eye   Cataract, right eye   Congenital blindness   LEFT EYE  Depression   Glaucoma, bilateral   Hepatic cirrhosis ([place])   Hepatitis   HEP C treated in 2016  HTN (hypertension)   Seizures ([place])   3 years ago Past Surgical History: Past Surgical History: Procedure Laterality Date  CATARACT EXTRACTION Left   GLAUCOMA SURGERY    Patient reports 10 or eleven surgeries for Glaucoma HPI: 70 y/o male presented to ED on 10/23/21 after bystanders witnessed patient collapse with associated seizure. Another seizure witnessed in ED. CT head negative. CT C-spine negative. LTM EEG on 11/1 showed 2 seizures from R anterior temporal region. Last seizure noted on EEG on 11/3. Intubated 10/31-11/11. Trach placed 11/11. 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  Recommendations may be updated based on patient status, additional functional criteria and insurance authorization. Assessment / Plan / Recommendation Clinical Impressions 12/01/2021 Clinical Impression Pt demonstrates a mild persistent oral dysphagia with slow oral preparatory phase with some lingual rocking and pumping prior to masticated bolus transit. Mastication however is adequate and complete. There is an ongoing primary esoaphgeal dysphagia with mild backflow of thin and solids to pyriform sinuses and signs of retrograde flow during esophageal sweep. Solid pharyngeal residue clears with a liquid wash, thin residue is mild except for one instance of sensed aspiration with thin and pill just before and after (due to  backflow) the swallow. Pt had PMSV in place during testing and vocal quality and cough strength adequate to protect airway. He did have frequent coughing throughout testing not related to aspriation. Suspect there may be sensation of esophageal dysphagia. Recommend pt resume a regular diet and thin liquids with esophageal precautions and PMSV in place for meals. Pills to be given crushed in puree. Risk of postprandial aspiration still present. Will trial diet to determine pts tolerance.  SLP Visit Diagnosis Dysphagia, pharyngoesophageal phase (R13.14) Attention and concentration deficit following -- Frontal lobe and executive function deficit following -- Impact on safety and function Mild aspiration risk   Treatment Recommendations 12/01/2021 Treatment Recommendations Therapy as outlined in treatment plan below   Prognosis 11/22/2021 Prognosis for Safe Diet Advancement Good Barriers to Reach Goals Severity of deficits Barriers/Prognosis Comment -- Diet Recommendations 12/01/2021 SLP Diet Recommendations Regular solids;Thin liquid Liquid Administration via Cup;Straw Medication Administration Crushed with puree Compensations Slow rate;Small sips/bites;Minimize environmental distractions;Follow solids with liquid Postural Changes Seated upright at 90 degrees;Remain semi-upright after after feeds/meals (Comment)   Other Recommendations 12/01/2021 Recommended Consults -- Oral Care Recommendations Oral care BID Other Recommendations -- Follow Up Recommendations -- Assistance recommended at discharge -- Functional Status Assessment -- Frequency and Duration  11/22/2021 Speech Therapy Frequency (ACUTE ONLY) min 2x/week Treatment Duration 2 weeks   Oral Phase 12/01/2021 Oral Phase Impaired Oral - Pudding Teaspoon -- Oral - Pudding Cup -- Oral - Honey Teaspoon -- Oral - Honey Cup -- Oral - Nectar Teaspoon -- Oral - Nectar  Cup -- Oral - Nectar Straw WFL Oral - Thin Teaspoon -- Oral - Thin Cup NT Oral - Thin Straw WFL Oral - Puree  Delayed oral transit Oral - Mech Soft -- Oral - Regular Delayed oral transit Oral - Multi-Consistency -- Oral - Pill Decreased bolus cohesion Oral Phase - Comment --  Pharyngeal Phase 12/01/2021 Pharyngeal Phase Impaired Pharyngeal- Pudding Teaspoon -- Pharyngeal -- Pharyngeal- Pudding Cup -- Pharyngeal -- Pharyngeal- Honey Teaspoon -- Pharyngeal -- Pharyngeal- Honey Cup -- Pharyngeal -- Pharyngeal- Nectar Teaspoon -- Pharyngeal -- Pharyngeal- Nectar Cup -- Pharyngeal -- Pharyngeal- Nectar Straw WFL Pharyngeal Material does not enter airway Pharyngeal- Thin Teaspoon -- Pharyngeal -- Pharyngeal- Thin Cup [facility] Pharyngeal Material does not enter airway Pharyngeal- Thin Straw WFL Pharyngeal Material does not enter airway Pharyngeal- Puree WFL Pharyngeal -- Pharyngeal- Mechanical Soft -- Pharyngeal -- Pharyngeal- Regular WFL Pharyngeal -- Pharyngeal- Multi-consistency -- Pharyngeal -- Pharyngeal- Pill Penetration/Apiration after swallow;Trace aspiration;Penetration/Aspiration before swallow Pharyngeal Material enters airway, passes BELOW cords then ejected out Pharyngeal Comment --  No flowsheet data found. DeBlois, Katherene Ponto 12/01/2021, 10:35 AM                     DG Swallowing Func-Speech Pathology  Result Date: 11/22/2021 Table formatting from the original result was not included. Objective Swallowing Evaluation: Type of Study: MBS-Modified Barium Swallow Study  Patient Details Name: KEMAURI MUSA MRN: [id number] Date of Birth: [birth year] Today's Date: 11/22/2021 Time: SLP Start Time (ACUTE ONLY): 1337 -SLP Stop Time (ACUTE ONLY): 1359 SLP Time Calculation (min) (ACUTE ONLY): 22 min Past Medical History: Past Medical History: Diagnosis Date  Alcohol abuse   Allergy   Anemia   Anxiety   Arthritis   Blind left eye   Cataract, right eye   Congenital blindness   LEFT EYE  Depression   Glaucoma, bilateral   Hepatic cirrhosis ([place])   Hepatitis   HEP C treated in 2016  HTN (hypertension)   Seizures ([place])   3  years ago Past Surgical History: Past Surgical History: Procedure Laterality Date  CATARACT EXTRACTION Left   GLAUCOMA SURGERY    Patient reports 10 or eleven surgeries for Glaucoma HPI: 70 y/o male presented to ED on 10/23/21 after bystanders witnessed patient collapse with associated seizure. Another seizure witnessed in ED. CT head negative. CT C-spine negative. LTM EEG on 11/1 showed 2 seizures from R anterior temporal region. Last seizure noted on EEG on 11/3. Intubated 10/31-11/11. Trach placed 11/11. 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  Recommendations may be updated based on patient status, additional functional criteria and insurance authorization. Assessment / Plan / Recommendation Clinical Impressions 11/22/2021 Clinical Impression Pt was seen for an MBS. Overall, pt exhibits moderate oropharyngeal dysphagia due to oral weakness and incoordination and impaired timing of epiglottic inversion resulting in aspiration of thin liquids and penetration of nectar thick liquid. Pt exhibited no oral phase impairments with thin liquid from cup or straw, however demonstrated weak lingual manipulation, lingual pumping, and delayed oral transit of nectar thick and puree with additional impaired mastication of regular solids. Pharyngeally, pt exhibited penetration to above the level of the vocal  cords that did not clear (PAS 3) with small sips of thin liquid from straw and aspiration not clearing despite attempt (PAS 7) with large/consecutive sips from straw. Large/consecutive sips of nectar thick liquid from  straw resulted in trace amount of penetration above the level of the vocal folds that did not clear (PAS 3). Pt exhibited no penetration/aspiration with puree or regular solids. Across all consistencies, pt exhibited mild-moderate vallecular residue and backflow to the cricopharyngeal segment after original clearance.  Scanned the esophagus which is not diagnostic however revealing retrograde movement to proximal esophagus. Recommend Dys1/nectar thick liquid diet, administering medications crushed in puree. Wear PMV with all meals and meds. Pt requires full supervision to implement small sips/bites and slow rate strategies. SLP will continue to follow pt for management of diet tolerance and upgraded PO trials as appropriate. SLP Visit Diagnosis Dysphagia, oropharyngeal phase (R13.12) Attention and concentration deficit following -- Frontal lobe and executive function deficit following -- Impact on safety and function Moderate aspiration risk   Treatment Recommendations 11/22/2021 Treatment Recommendations Therapy as outlined in treatment plan below   Prognosis 11/22/2021 Prognosis for Safe Diet Advancement Good Barriers to Reach Goals Severity of deficits Barriers/Prognosis Comment -- Diet Recommendations 11/22/2021 SLP Diet Recommendations Dysphagia 1 (Puree) solids;Nectar thick liquid Liquid Administration via Cup;Straw Medication Administration Crushed with puree Compensations Slow rate;Small sips/bites;Minimize environmental distractions;Lingual sweep for clearance of pocketing Postural Changes Remain semi-upright after after feeds/meals (Comment);Seated upright at 90 degrees   Other Recommendations 11/22/2021 Recommended Consults Consider GI evaluation Oral Care Recommendations Oral care BID Other Recommendations Order thickener from pharmacy Follow Up Recommendations Skilled nursing-short term rehab (<3 hours/day) Assistance recommended at discharge Frequent or constant Supervision/Assistance Functional Status  Assessment Patient has had a recent decline in their functional status and demonstrates the ability to make significant improvements in function in a reasonable and predictable amount of time. Frequency and Duration  11/22/2021 Speech Therapy Frequency (ACUTE ONLY) min 2x/week Treatment Duration 2 weeks   Oral Phase 11/22/2021 Oral Phase Impaired Oral - Pudding Teaspoon -- Oral - Pudding Cup -- Oral - Honey Teaspoon -- Oral - Honey Cup -- Oral - Nectar Teaspoon -- Oral - Nectar Cup -- Oral - Nectar Straw Lingual pumping;Delayed oral transit;Reduced posterior propulsion Oral - Thin Teaspoon -- Oral - Thin Cup WFL Oral - Thin Straw WFL Oral - Puree Lingual pumping;Weak lingual manipulation;Reduced posterior propulsion;Delayed oral transit Oral - Mech Soft -- Oral - Regular Lingual pumping;Weak lingual manipulation;Impaired mastication;Reduced posterior propulsion;Delayed oral transit Oral - Multi-Consistency -- Oral - Pill -- Oral Phase - Comment --  Pharyngeal Phase 11/22/2021 Pharyngeal Phase Impaired Pharyngeal- Pudding Teaspoon -- Pharyngeal -- Pharyngeal- Pudding Cup -- Pharyngeal -- Pharyngeal- Honey Teaspoon -- Pharyngeal -- Pharyngeal- Honey Cup -- Pharyngeal -- Pharyngeal- Nectar Teaspoon -- Pharyngeal -- Pharyngeal- Nectar Cup -- Pharyngeal -- Pharyngeal- Nectar Straw Penetration/Aspiration during swallow;Pharyngeal residue - valleculae;Pharyngeal residue - cp segment Pharyngeal Material enters airway, remains ABOVE vocal cords and not ejected out Pharyngeal- Thin Teaspoon -- Pharyngeal -- Pharyngeal- Thin Cup Penetration/Aspiration during swallow;Pharyngeal residue - valleculae;Pharyngeal residue - cp segment Pharyngeal Material enters airway, remains ABOVE vocal cords then ejected out Pharyngeal- Thin Straw Penetration/Aspiration during swallow;Pharyngeal residue - valleculae;Pharyngeal residue - cp segment;Trace aspiration Pharyngeal Material enters airway, remains ABOVE vocal cords and not ejected  out;Material enters airway, passes BELOW cords and not ejected out despite cough attempt by patient Pharyngeal- Puree Pharyngeal residue - valleculae;Pharyngeal residue - cp segment Pharyngeal Material does not enter airway Pharyngeal- Mechanical Soft --  Pharyngeal -- Pharyngeal- Regular Pharyngeal residue - valleculae;Pharyngeal residue - cp segment Pharyngeal Material does not enter airway Pharyngeal- Multi-consistency -- Pharyngeal -- Pharyngeal- Pill -- Pharyngeal -- Pharyngeal Comment --  No flowsheet data found. Houston Siren 11/22/2021, 3:12 PM                     VAS [place] LOWER EXTREMITY VENOUS (DVT)  Result Date: 12/13/2021  Lower Venous DVT Study Patient Name:  TAMARICK KOVALCIK  Date of Exam:   12/13/2021 Medical Rec #: [id number]          Accession #:    [id number] Date of Birth: [birth year]         Patient Gender: M Patient Age:   17 years Exam Location:  [facility] Procedure:      VAS [place] LOWER EXTREMITY VENOUS (DVT) Referring Phys: Erin Hearing --------------------------------------------------------------------------------  Indications: Pain.  Comparison Study: No prior venous studies. History of abnormal ABI. Performing Technologist: Darlin Coco RDMS, RVT  Examination Guidelines: A complete evaluation includes B-mode imaging, spectral Doppler, color Doppler, and power Doppler as needed of all accessible portions of each vessel. Bilateral testing is considered an integral part of a complete examination. Limited examinations for reoccurring indications may be performed as noted. The reflux portion of the exam is performed with the patient in reverse Trendelenburg.  +---------+---------------+---------+-----------+----------+--------------+  RIGHT     Compressibility Phasicity Spontaneity Properties Thrombus Aging  +---------+---------------+---------+-----------+----------+--------------+  CFV       Full            Yes       Yes                                     +---------+---------------+---------+-----------+----------+--------------+  SFJ       Full                                                             +---------+---------------+---------+-----------+----------+--------------+  FV Prox   Full                                                             +---------+---------------+---------+-----------+----------+--------------+  FV Mid    Full                                                             +---------+---------------+---------+-----------+----------+--------------+  FV Distal Full                                                             +---------+---------------+---------+-----------+----------+--------------+  PFV       Full                                                             +---------+---------------+---------+-----------+----------+--------------+  POP       Full            Yes       Yes                                    +---------+---------------+---------+-----------+----------+--------------+  PTV       Full                                                             +---------+---------------+---------+-----------+----------+--------------+  PERO      Full                                                             +---------+---------------+---------+-----------+----------+--------------+  Gastroc   Full                                                             +---------+---------------+---------+-----------+----------+--------------+   +----+---------------+---------+-----------+----------+--------------+  LEFT Compressibility Phasicity Spontaneity Properties Thrombus Aging  +----+---------------+---------+-----------+----------+--------------+  CFV  Full            Yes       Yes                                    +----+---------------+---------+-----------+----------+--------------+     Summary: RIGHT: - There is no evidence of deep vein thrombosis in the lower extremity.  - No cystic structure found in the popliteal fossa.  -  Incidental: Superficial femoral artery appears occluded. Reconstituted flow is noted in the posterior tibial/peroneal arteries.  LEFT: - No evidence of common femoral vein obstruction.  *See table(s) above for measurements and observations.    Preliminary    VAS [place] UPPER EXTREMITY VENOUS DUPLEX  Result Date: 11/18/2021 UPPER VENOUS STUDY  Patient Name:  [name]  Date of Exam:   11/17/2021 Medical Rec #: [id number]          Accession #:    [id number] Date of Birth: 14-Oct-1951         Patient Gender: M Patient Age:   89 years Exam Location:  [facility] Procedure:      VAS [place] UPPER EXTREMITY VENOUS DUPLEX Referring Phys: Noemi Chapel --------------------------------------------------------------------------------  Indications: S/P PICC left arm Limitations: Constant patient movement, collar, bandaging. Comparison Study: 11-02-2021 Bilateral upper extremity venous was negative. Performing Technologist: Darlin Coco RDMS, RVT  Examination Guidelines: A complete evaluation includes B-mode imaging, spectral Doppler, color Doppler, and power Doppler as needed of all accessible portions of each vessel. Bilateral testing is considered an integral part of a complete examination. Limited examinations for reoccurring indications may be performed as noted.  Right Findings: +----------+------------+---------+-----------+----------+---------------------+  RIGHT      Compressible Phasicity Spontaneous Properties        Summary         +----------+------------+---------+-----------+----------+---------------------+  Subclavian                                                 Constant patient                                                                movement, unable to                                                                    obtain          +----------+------------+---------+-----------+----------+---------------------+  Left Findings:  +----------+------------+---------+-----------+----------+---------------------+  LEFT       Compressible Phasicity Spontaneous Properties        Summary         +----------+------------+---------+-----------+----------+---------------------+  IJV            Full        Yes        Yes                                       +----------+------------+---------+-----------+----------+---------------------+  Subclavian     Full        Yes        Yes                                       +----------+------------+---------+-----------+----------+---------------------+  Axillary       Full        Yes        Yes                                       +----------+------------+---------+-----------+----------+---------------------+  Brachial       Full                                                             +----------+------------+---------+-----------+----------+---------------------+  Radial         Full                                                             +----------+------------+---------+-----------+----------+---------------------+  Ulnar          Full                                                             +----------+------------+---------+-----------+----------+---------------------+  Cephalic       None        No         No                   Age Indeterminate    +----------+------------+---------+-----------+----------+---------------------+  Basilic        Full                                       Not well visualized                                                                due to patient                                                                positioning/movement   +----------+------------+---------+-----------+----------+---------------------+  Summary:  Left: No evidence of deep vein thrombosis in the upper extremity. Findings consistent with age indeterminate superficial vein thrombosis involving the left cephalic vein.  *See table(s) above for measurements and observations.   Diagnosing physician: Orlie Pollen Electronically signed by Orlie Pollen on 11/18/2021 at 10:39:30 AM.    Final     Microbiology: Recent Results (from the past 240 hour(s))  SARS CORONAVIRUS 2 (TAT 6-24 HRS) Nasopharyngeal Nasopharyngeal Swab     Status: None   Collection Time: 12/13/21  2:22 PM   Specimen: Nasopharyngeal Swab  Result Value Ref Range Status   SARS Coronavirus 2 NEGATIVE NEGATIVE Final    Comment: (NOTE) SARS-CoV-2 target nucleic acids are NOT DETECTED.  The SARS-CoV-2 RNA is generally detectable in upper and lower respiratory specimens during the acute phase of infection. Negative results do not preclude SARS-CoV-2 infection, do not rule out co-infections with other pathogens, and should not be used as the sole basis for treatment or other patient management decisions. Negative results must be combined with clinical observations, patient history, and epidemiological information. The expected result is Negative.  Fact Sheet for Patients: SugarRoll.be  Fact Sheet for Healthcare Providers: https://www.woods-mathews.com/  This test is not yet approved or cleared by the [place] FDA and  has been authorized for detection and/or diagnosis of SARS-CoV-2 by FDA under an Emergency Use Authorization (EUA). This EUA will remain  in effect (meaning this test can be used) for the duration of the COVID-19 declaration under Se ction 564(b)(1) of the Act, 21 U.S.C. section 360bbb-3(b)(1), unless the authorization is terminated or revoked sooner.  Performed at [facility], [place] [address] [place], [place] [address]         CBG: Recent Labs  Lab 12/13/21 1944 12/13/21 2356 12/14/21 0344 12/14/21 0753 12/14/21 1125  GLUCAP 111* 158* 85 95 121*       Signed:  Jaeley Wiker ANP [facility] 12/14/2021, 11:55 AM

## 2021-12-14 NOTE — Plan of Care (Signed)
Pt is alert oriented x 4. No distress noted. Prn tylenol given for pain to right hip and right wrist. Pt currently resting with eyes closed. Dressings to left forearm and buttocks/ sacral area changed per order.    Problem: Education: Goal: Knowledge of General Education information will improve Description: Including pain rating scale, medication(s)/side effects and non-pharmacologic comfort measures Outcome: Progressing   Problem: Health Behavior/Discharge Planning: Goal: Ability to manage health-related needs will improve Outcome: Progressing   Problem: Clinical Measurements: Goal: Ability to maintain clinical measurements within normal limits will improve Outcome: Progressing Goal: Will remain free from infection Outcome: Progressing Goal: Diagnostic test results will improve Outcome: Progressing Goal: Respiratory complications will improve Outcome: Progressing Goal: Cardiovascular complication will be avoided Outcome: Progressing   Problem: Activity: Goal: Risk for activity intolerance will decrease Outcome: Progressing   Problem: Nutrition: Goal: Adequate nutrition will be maintained Outcome: Progressing   Problem: Coping: Goal: Level of anxiety will decrease Outcome: Progressing   Problem: Elimination: Goal: Will not experience complications related to bowel motility Outcome: Progressing Goal: Will not experience complications related to urinary retention Outcome: Progressing   Problem: Pain Managment: Goal: General experience of comfort will improve Outcome: Progressing   Problem: Safety: Goal: Ability to remain free from injury will improve Outcome: Progressing   Problem: Skin Integrity: Goal: Risk for impaired skin integrity will decrease Outcome: Progressing   Problem: Safety: Goal: Non-violent Restraint(s) Outcome: Progressing

## 2021-12-14 NOTE — Progress Notes (Signed)
NAME:  Jeffrey Mullen, MRN:  [id number], DOB:  1951-06-19, LOS: 70 ADMISSION DATE:  10/23/2021, CONSULTATION DATE:  10/31 REFERRING MD:  Dr. Dina Rich, CHIEF COMPLAINT:  AMS   History of Present Illness:  70 y/o M who presented to [facility] ER on 10/31 via EMS with reports of altered mental status.  Given  right sided gaze, altered and left sided weakness, code stoke called. The patient was taken emergently to CT for imaging which was negative for LVO.  While in the [place], he had a witnessed seizure.    PCCM called for ICU admission.   Chart review shows he was seen in the ER on 10/4 after passing out at the [employer].  Physical exam was negative at the time.  Labs negative.  No further imaging obtained.   Pertinent  Medical History  Alcohol Use - unclear quantity Anxiety / Depression  Congenital blindness of left eye, cataract on R Hepatitis C+, treated, better in 2016 Seizures   Significant Hospital Events: Including procedures, antibiotic start and stop dates in addition to other pertinent events   10/31 Admit with AMS, seizure, hypertensive emergency, AKI 11/1 MRI brain no acute intracranial process. No etiology is seen for the patient's seizure. 11/02 bedside LP attempted but unsuccessful  11/03  No seizures seen on EEG overnight  11/04 weaning sedation to assess for seizure reoccurrence  11/9 bronchoscopy with noted thick secretions in LLL 11/10 Left PICC placed 11/11 Trach,  Cor Track placed, New Embolic Strokes per MRI 40/98 Post Trach Day 1, some oozing from site, Echo results noted below 11/13 Low grade fever, tachy, LFT's remain elevated with ALT and Alk Phos continuing to rise.  S/p lasix  11/14 weaned PSV 10/5, no changes, foley removed 11/15 on ATC, TEE attempted- unable to pass probe, briefly on Neo, cortrak removed fort TEE, tmax 102.3 11/17 afebrile, NG on cultures, continues to have copious secretions 11/20 fever trend is down, on Ancef 11/22: only tolerating  PSV trials for 3 hours a day; broadened antibiotics to zosyn/vanc due to fever spike; recultured 12/9 MBS. Solid pharyngeal residue clears with a liquid wash, thin residue is mild except for one instance of sensed aspiration with thin and pill just before and after (due to backflow) the swallow. Pt had PMSV in place during testing and vocal quality and cough strength adequate to protect airway. He did have frequent coughing throughout testing not related to aspiration. Regular solids;Thin liquid.  12/9 changed trach to 6 cuffless. Starting capping trials  12/15 cap removed, suctioned several times overnight because he can't clear thick secretions, started doxycycline 12/19 trach capped again 12/20  decannulated   Interim History / Subjective:   Feeling well  Objective   Blood pressure 124/77, pulse 61, temperature 98.7 F (37.1 C), temperature source Oral, resp. rate 14, height _0  (1.778 m), weight 121.1 kg, SpO2 100 %.       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 P[facility]M Pager: See Shea Evans

## 2021-12-14 NOTE — Progress Notes (Signed)
Physical Therapy Treatment Patient Details Name: Jeffrey Mullen MRN: [id number] DOB: 10-Mar-1951 Today's Date: 12/14/2021   History of Present Illness 70 y/o male presented to ED on 10/23/21 after bystanders witnessed patient collapse with associated seizure. Another seizure witnessed in ED. CT head negative. CT C-spine negative. LTM EEG on 11/1 showed 2 seizures from R anterior temporal region. Last seizure noted on EEG on 11/3. Intubated 10/31-11/11. Trach placed 11/11. Decannulated 12/20. 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    PT Comments    Pt received in side lying to L side, agreeable to therapy session with encouragement and with good participation and fair tolerance for exercise and transfer training. Pt limited due to orthostatic symptoms/dizziness when standing >2 minutes. Pt BP 123/64 (82) seated EOB prior to standing and BP 67/41 (50) seated EOB after standing ~3 minutes. Standing BP (listed below in comments section) may be inaccurate due to pt moving Lt arm. Pt on bed pan in supine at end of session, RN present and BP stable 152/89 (108). He may benefit from compression wraps to BLE and abdomen to see if this helps with hemodynamics in future mobility attempts. Pt continues to benefit from PT services to progress toward functional mobility goals.   Recommendations for follow up therapy are one component of a multi-disciplinary discharge planning process, led by the attending physician.  Recommendations may be updated based on patient status, additional functional criteria and insurance authorization.  Follow Up Recommendations  Skilled nursing-short term rehab (<3 hours/day)     Assistance Recommended at Discharge Frequent or constant  Supervision/Assistance  Equipment Recommendations  Rolling walker (2 wheels);BSC/3in1;Other (comment)    Recommendations for Other Services Other (comment)     Precautions / Restrictions Precautions Precautions: Fall Precaution Comments: L eye blindness, skin breakdown in peri area/sacral wounds, orthostasis Restrictions Weight Bearing Restrictions: No     Mobility  Bed Mobility Overal bed mobility: Needs Assistance Bed Mobility: Rolling;Sidelying to Sit Rolling: Supervision   Supine to sit: Min assist   Sit to sidelying: Min assist General bed mobility comments: used bed rail, min trunk assist to rise and minA for BLE assist to sidelying, cues for technique needed    Transfers Overall transfer level: Needs assistance Equipment used: Rolling walker (2 wheels) Transfers: Sit to/from Stand Sit to Stand: Mod assist           General transfer comment: modA to stand and poor eccentric control for stand>sit as pt with orthostatic symptoms and very fatigued    Ambulation/Gait               General Gait Details: defer, pt with orthostatic symptoms and soft BP after standing, pt urgent to have BM unable to progress safely today   Stairs             Wheelchair Mobility    Modified Rankin (Stroke Patients Only) Modified Rankin (Stroke Patients Only) Pre-Morbid Rankin Score: No symptoms Modified Rankin: Moderately severe disability     Balance Overall balance assessment: Needs assistance Sitting-balance support: Feet supported;No upper extremity supported Sitting balance-Leahy Scale: Fair     Standing balance support: Single extremity supported;During functional activity Standing balance-Leahy Scale: Poor Standing balance comment: reliant on RW, can place single UE on RW while getting BP assessment at bedside and variable min to modA  due to sx dizziness at end of trial                            Cognition Arousal/Alertness:  Awake/alert Behavior During Therapy: WFL for tasks assessed/performed Overall Cognitive Status: No family/caregiver present to determine baseline cognitive functioning Area of Impairment: Problem solving;Safety/judgement;Following commands                   Current Attention Level: Selective   Following Commands: Follows one step commands inconsistently;Follows one step commands with increased time Safety/Judgement: Decreased awareness of safety;Decreased awareness of deficits Awareness: Intellectual   General Comments: participatory as able, limited by fatigue/orthostatic symptoms        Exercises General Exercises - Lower Extremity Long Arc Quad: AROM;Both;15 reps;Seated Hip Flexion/Marching: AROM;Both;Seated;Standing;20 reps (x10 seated, x10 reps standing) Other Exercises Other Exercises: STS and standing 3 mins for BLE strengthening    General Comments General comments (skin integrity, edema, etc.): BP inconsistent due to pt at times moving, BP 123/64 (82) initially sitting EOB, SBP 121 standing at bedside (dizzy), BP 106/95 (100) standing after 3 mins and marching activity, BP 67/41 seated EOB after stand>sit; BP 152/89 (108) after return to supine and on bed pan, head elevated >30 deg; HR not assessed as tele monitor not active      Pertinent Vitals/Pain Pain Assessment: Faces Faces Pain Scale: Hurts little more Breathing: normal Negative Vocalization: none Facial Expression: smiling or inexpressive Body Language: relaxed Consolability: no need to console PAINAD Score: 0 Pain Location: L arm with BP reading, R calf Pain Descriptors / Indicators: Discomfort;Cramping;Sore Pain Intervention(s): Limited activity within patient's tolerance;Monitored during session;Repositioned    Home Living                          Prior Function            PT Goals (current goals can now be found in the care plan section) Acute Rehab PT Goals Patient Stated Goal:  to get stronger PT Goal Formulation: With patient Time For Goal Achievement: 12/26/20 Progress towards PT goals: Progressing toward goals    Frequency    Min 4X/week      PT Plan Current plan remains appropriate    Co-evaluation              AM-PAC PT "6 Clicks" Mobility   Outcome Measure  Help needed turning from your back to your side while in a flat bed without using bedrails?: A Little Help needed moving from lying on your back to sitting on the side of a flat bed without using bedrails?: A Little Help needed moving to and from a bed to a chair (including a wheelchair)?: A Lot Help needed standing up from a chair using your arms (e.g., wheelchair or bedside chair)?: A Lot Help needed to walk in hospital room?: A Lot Help needed climbing 3-5 steps with a railing? : Total 6 Click Score: 13    End of Session Equipment Utilized During Treatment: Gait belt Activity Tolerance: Patient tolerated treatment well Patient left: with call bell/phone within reach;in bed;with nursing/sitter in room (on bed pan, heels floated) Nurse Communication: Mobility status;Other (comment);Precautions (LUE pain with BP reading, orthostatic symptoms) PT Visit Diagnosis: Other abnormalities of gait and mobility (R26.89);Other symptoms and signs involving the nervous system (R29.898);Muscle weakness (generalized) (M62.81)     Time: 1050-1110 PT Time Calculation (min) (ACUTE ONLY): 20 min  Charges:  $Therapeutic Exercise: 8-22 mins                     Haidar Muse P., PTA Acute Rehabilitation Services Pager: [phone] Office: [place] 12/14/2021, 11:23 AM

## 2021-12-14 NOTE — TOC Transition Note (Addendum)
Transition of Care [facility]) - CM/SW Discharge Note   Patient Details  Name: Jeffrey Mullen MRN: [id number] Date of Birth: 24-Jul-1951  [facility] of [facility] CM/SW Contact:  Curlene Labrum, RN Phone Number: 12/14/2021, 10:59 AM   Clinical Narrative:    The patient will be discharging to [facility] today by PTAR around 1200 noon.  I spoke with the patient and he is aware that [facility] was unable to provide a bed to the patient but [facility] facility had an available bed opening today for admission.  The patient is agreeable to admission at the facility and bedside nursing will call report to [facility].  PTAR packet was placed at the secretary's desk including DNR form.  Once the discharge summary and AVS are available - these documents will be placed in the PTAR packet for transport.  CM and MSW with DTP Team will continue to follow the patient for discharge to [facility] at noon today.  12/14/2021 1454-  CM spoke with PTAR and the patient should be the next patient transported to the facility.  Shon Baton, MSW spoke with Chauncy Lean, SW with APS and she is aware that the patient is being transported to [facility].  Final next level of care: Skilled Nursing Facility Barriers to Discharge: Continued Medical Work up   Patient Goals and CMS Choice   CMS Medicare.gov Compare Post Acute Care list provided to:: Patient Represenative (must comment) (message left with Marsh Dolly, APS) Choice offered to / list presented to :  (APS - Titus Mould, SW finding family to be [place])  Discharge Placement                       Discharge Plan and Services   Discharge Planning Services: CM Consult Post Acute Care Choice: [place]                               Social Determinants of Health (SDOH) Interventions     Readmission Risk Interventions No flowsheet data found.

## 2021-12-18 DIAGNOSIS — S61402A Unspecified open wound of left hand, initial encounter: Secondary | ICD-10-CM | POA: Diagnosis not present

## 2021-12-18 DIAGNOSIS — I639 Cerebral infarction, unspecified: Secondary | ICD-10-CM | POA: Diagnosis not present

## 2021-12-18 DIAGNOSIS — R5381 Other malaise: Secondary | ICD-10-CM | POA: Diagnosis not present

## 2021-12-18 DIAGNOSIS — Z7409 Other reduced mobility: Secondary | ICD-10-CM | POA: Diagnosis not present

## 2021-12-18 DIAGNOSIS — G9340 Encephalopathy, unspecified: Secondary | ICD-10-CM | POA: Diagnosis not present

## 2021-12-18 DIAGNOSIS — L89323 Pressure ulcer of left buttock, stage 3: Secondary | ICD-10-CM | POA: Diagnosis not present

## 2021-12-18 DIAGNOSIS — L89622 Pressure ulcer of left heel, stage 2: Secondary | ICD-10-CM | POA: Diagnosis not present

## 2021-12-18 DIAGNOSIS — R531 Weakness: Secondary | ICD-10-CM | POA: Diagnosis not present

## 2021-12-19 DIAGNOSIS — L89153 Pressure ulcer of sacral region, stage 3: Secondary | ICD-10-CM | POA: Diagnosis not present

## 2021-12-19 DIAGNOSIS — I10 Essential (primary) hypertension: Secondary | ICD-10-CM | POA: Diagnosis not present

## 2021-12-19 DIAGNOSIS — I639 Cerebral infarction, unspecified: Secondary | ICD-10-CM | POA: Diagnosis not present

## 2021-12-19 DIAGNOSIS — L89323 Pressure ulcer of left buttock, stage 3: Secondary | ICD-10-CM | POA: Diagnosis not present

## 2021-12-19 DIAGNOSIS — F32A Depression, unspecified: Secondary | ICD-10-CM | POA: Diagnosis not present

## 2021-12-19 DIAGNOSIS — E119 Type 2 diabetes mellitus without complications: Secondary | ICD-10-CM | POA: Diagnosis not present

## 2021-12-19 DIAGNOSIS — I739 Peripheral vascular disease, unspecified: Secondary | ICD-10-CM | POA: Diagnosis not present

## 2021-12-19 DIAGNOSIS — K219 Gastro-esophageal reflux disease without esophagitis: Secondary | ICD-10-CM | POA: Diagnosis not present

## 2021-12-20 DIAGNOSIS — L89153 Pressure ulcer of sacral region, stage 3: Secondary | ICD-10-CM | POA: Diagnosis not present

## 2021-12-20 DIAGNOSIS — T80818A Extravasation of other vesicant agent, initial encounter: Secondary | ICD-10-CM | POA: Diagnosis not present

## 2021-12-20 DIAGNOSIS — L89323 Pressure ulcer of left buttock, stage 3: Secondary | ICD-10-CM | POA: Diagnosis not present

## 2021-12-20 DIAGNOSIS — L89623 Pressure ulcer of left heel, stage 3: Secondary | ICD-10-CM | POA: Diagnosis not present

## 2021-12-21 DIAGNOSIS — R5381 Other malaise: Secondary | ICD-10-CM | POA: Diagnosis not present

## 2021-12-21 DIAGNOSIS — K219 Gastro-esophageal reflux disease without esophagitis: Secondary | ICD-10-CM | POA: Diagnosis not present

## 2021-12-21 DIAGNOSIS — E119 Type 2 diabetes mellitus without complications: Secondary | ICD-10-CM | POA: Diagnosis not present

## 2021-12-21 DIAGNOSIS — I10 Essential (primary) hypertension: Secondary | ICD-10-CM | POA: Diagnosis not present

## 2021-12-21 DIAGNOSIS — R1310 Dysphagia, unspecified: Secondary | ICD-10-CM | POA: Diagnosis not present

## 2021-12-21 DIAGNOSIS — Z7409 Other reduced mobility: Secondary | ICD-10-CM | POA: Diagnosis not present

## 2021-12-21 DIAGNOSIS — J302 Other seasonal allergic rhinitis: Secondary | ICD-10-CM | POA: Diagnosis not present

## 2021-12-29 ENCOUNTER — Ambulatory Visit: Payer: Medicare Other | Admitting: Orthopedic Surgery

## 2021-12-29 ENCOUNTER — Other Ambulatory Visit: Payer: Self-pay

## 2021-12-29 DIAGNOSIS — I739 Peripheral vascular disease, unspecified: Secondary | ICD-10-CM

## 2022-01-04 DIAGNOSIS — I10 Essential (primary) hypertension: Secondary | ICD-10-CM | POA: Diagnosis not present

## 2022-01-04 DIAGNOSIS — F32A Depression, unspecified: Secondary | ICD-10-CM | POA: Diagnosis not present

## 2022-01-04 DIAGNOSIS — E119 Type 2 diabetes mellitus without complications: Secondary | ICD-10-CM | POA: Diagnosis not present

## 2022-01-04 DIAGNOSIS — M25511 Pain in right shoulder: Secondary | ICD-10-CM | POA: Diagnosis not present

## 2022-01-04 DIAGNOSIS — R531 Weakness: Secondary | ICD-10-CM | POA: Diagnosis not present

## 2022-01-04 DIAGNOSIS — W19XXXD Unspecified fall, subsequent encounter: Secondary | ICD-10-CM | POA: Diagnosis not present

## 2022-01-04 DIAGNOSIS — I639 Cerebral infarction, unspecified: Secondary | ICD-10-CM | POA: Diagnosis not present

## 2022-01-08 DIAGNOSIS — I10 Essential (primary) hypertension: Secondary | ICD-10-CM | POA: Diagnosis not present

## 2022-01-08 DIAGNOSIS — K219 Gastro-esophageal reflux disease without esophagitis: Secondary | ICD-10-CM | POA: Diagnosis not present

## 2022-01-08 DIAGNOSIS — E119 Type 2 diabetes mellitus without complications: Secondary | ICD-10-CM | POA: Diagnosis not present

## 2022-01-08 DIAGNOSIS — R5381 Other malaise: Secondary | ICD-10-CM | POA: Diagnosis not present

## 2022-01-08 DIAGNOSIS — I639 Cerebral infarction, unspecified: Secondary | ICD-10-CM | POA: Diagnosis not present

## 2022-01-08 DIAGNOSIS — Z79899 Other long term (current) drug therapy: Secondary | ICD-10-CM | POA: Diagnosis not present

## 2022-01-08 DIAGNOSIS — R52 Pain, unspecified: Secondary | ICD-10-CM | POA: Diagnosis not present

## 2022-01-08 NOTE — Progress Notes (Signed)
ASSESSMENT & PLAN:  71 y.o. male with atherosclerosis of native arteries of bilateral lower extremities causing right lower extremity ischemic rest pain.  Recommend the following which can slow the progression of atherosclerosis and reduce the risk of major adverse cardiac / limb events:  Complete cessation from all tobacco products. Blood glucose control with goal A1c < 7%. Blood pressure control with goal blood pressure < 140/90 mmHg. Lipid reduction therapy with goal LDL-C <100 mg/dL (<70 if symptomatic from PAD).  Aspirin 81mg  PO QD.  Atorvastatin 40-80mg  PO QD (or other "high intensity" statin therapy).  Plan right lower extremity angiogram from left common femoral artery approach in cath lab 01/19/22.   CHIEF COMPLAINT:   right leg pain  HISTORY:  HISTORY OF PRESENT ILLNESS: Jeffrey Mullen is a 71 y.o. male referred to clinic for evaluation of bilateral discomfort in his legs when ambulating.  His symptoms are fairly classic for intermittent claudication.  He reports that 2 blocks he gets burning discomfort in his calves.  If he continues to walk this will progress to his thighs.  He also endorses erectile dysfunction.  He is a long-term smoker and is trying to cut down.  He is smoking 4 cigarettes a day.  Mostly concerned about his mobility deteriorating and requiring a wheelchair or other assistive device.  01/09/22: Patient returns to clinic for evaluation of progression of arterial disease.  Unfortunately he has suffered a stroke 10/23/2021.  He has had a very protracted course recovering (see Dr. Nena Alexander discharge summary 12/14/2021 for full detail).  He reports he is working hard with therapy to regain some function.  He is less ambulatory than previously, but is able to transfer and walk short distances.  Past Medical History:  Diagnosis Date   Alcohol abuse    Allergy    Anemia    Anxiety    Arthritis    Blind left eye    Cataract, right eye    Congenital  blindness    LEFT EYE   Depression    Glaucoma, bilateral    Hepatic cirrhosis ([place])    Hepatitis    HEP C treated in 2016   HTN (hypertension)    Seizures ([place])    3 years ago    Past Surgical History:  Procedure Laterality Date   CATARACT EXTRACTION Left    GLAUCOMA SURGERY     Patient reports 51 or eleven surgeries for Glaucoma    Family History  Problem Relation Age of Onset   Hearing loss Other    Kidney disease Neg Hx    Colon cancer Neg Hx    Esophageal cancer Neg Hx    Rectal cancer Neg Hx    Stomach cancer Neg Hx     Social History   Socioeconomic History   Marital status: Single    Spouse name: Not on file   Number of children: Not on file   Years of education: Not on file   Highest education level: Not on file  Occupational History   Not on file  Tobacco Use   Smoking status: Some Days    Packs/day: 0.25    Types: Cigarettes   Smokeless tobacco: Never  Vaping Use   Vaping Use: Never used  Substance and Sexual Activity   Alcohol use: Yes    Alcohol/week: 5.0 standard drinks    Types: 5 Cans of beer per week    Comment: twice a month   Drug use: No  Sexual activity: Not Currently  Other Topics Concern   Not on file  Social History Narrative   Not on file   Social Determinants of Health   Financial Resource Strain: Not on file  Food Insecurity: Not on file  Transportation Needs: Not on file  Physical Activity: Not on file  Stress: Not on file  Social Connections: Not on file  Intimate Partner Violence: Not on file    No Active Allergies   Current Outpatient Medications  Medication Sig Dispense Refill   acetaminophen (TYLENOL) 325 MG tablet Take 2 tablets (650 mg total) by mouth every 4 (four) hours as needed for fever or mild pain.     amLODipine (NORVASC) 10 MG tablet Take 1 tablet (10 mg total) by mouth daily. 90 tablet 1   arformoterol (BROVANA) 15 MCG/2ML NEBU Take 2 mLs (15 mcg total) by nebulization 2 (two) times daily. 120 mL     aspirin 81 MG chewable tablet Chew 1 tablet (81 mg total) by mouth daily. 30 tablet 3   atropine 1 % ophthalmic solution Place 1 drop into the left eye 2 (two) times daily. 15 mL 2   bimatoprost (LUMIGAN) 0.01 % SOLN Place 1 drop into the right eye at bedtime.     busPIRone (BUSPAR) 15 MG tablet TAKE ONE TABLET BY MOUTH THREE TIMES A DAY (Patient taking differently: Take 15 mg by mouth 2 (two) times daily as needed (anxiety).) 90 tablet 0   carvedilol (COREG) 12.5 MG tablet Take 1 tablet (12.5 mg total) by mouth 2 (two) times daily with a meal.     Cholecalciferol (VITAMIN D) 50 MCG (2000 UT) CAPS Take 2,000 Units by mouth daily.     collagenase (SANTYL) ointment Apply topically 2 (two) times daily. 15 g 0   docusate sodium (COLACE) 100 MG capsule Take 1 capsule (100 mg total) by mouth daily as needed for mild constipation. 10 capsule 0   dorzolamide-timolol (COSOPT) 22.3-6.8 MG/ML ophthalmic solution Place 1 drop into the right eye 2 (two) times daily.     fiber (NUTRISOURCE FIBER) PACK packet Take 1 packet by mouth 2 (two) times daily.     folic acid (FOLVITE) 1 MG tablet Take 1 tablet (1 mg total) by mouth daily.     guaiFENesin (MUCINEX) 600 MG 12 hr tablet Take 2 tablets (1,200 mg total) by mouth 2 (two) times daily.     insulin aspart (NOVOLOG) 100 UNIT/ML injection Inject 0-15 Units into the skin 3 (three) times daily with meals. Do NOT hold insulin if patient is NPO. Moderate Scale. 10 mL 11   ipratropium-albuterol (DUONEB) 0.5-2.5 (3) MG/3ML SOLN Take 3 mLs by nebulization every 4 (four) hours as needed. 360 mL    lacosamide (VIMPAT) 200 MG TABS tablet Take 1 tablet (200 mg total) by mouth 2 (two) times daily. 60 tablet    levETIRAcetam (KEPPRA) 100 MG/ML solution Take 5 mLs (500 mg total) by mouth 2 (two) times daily. 473 mL 12   loperamide (IMODIUM) 2 MG capsule Take 2 capsules (4 mg total) by mouth as needed for diarrhea or loose stools. 30 capsule 0   Multiple Vitamin (MULTIVITAMIN  WITH MINERALS) TABS tablet Take 1 tablet by mouth daily.     nicotine (NICODERM CQ - DOSED IN MG/24 HOURS) 14 mg/24hr patch Place 1 patch (14 mg total) onto the skin daily. 28 patch 0   pantoprazole (PROTONIX) 40 MG tablet Take 1 tablet (40 mg total) by mouth daily.  polyethylene glycol (MIRALAX / GLYCOLAX) 17 g packet Place 17 g into feeding tube daily as needed for moderate constipation. 14 each 0   revefenacin (YUPELRI) 175 MCG/3ML nebulizer solution Take 3 mLs (175 mcg total) by nebulization daily. 90 mL    saccharomyces boulardii (FLORASTOR) 250 MG capsule Take 1 capsule (250 mg total) by mouth 2 (two) times daily.     tacrolimus (PROTOPIC) 0.1 % ointment APPLY TO FACE NIGHTLY (Patient taking differently: Apply 1 application topically at bedtime.) 60 g 1   thiamine 100 MG tablet Take 1 tablet (100 mg total) by mouth daily.     triamcinolone (KENALOG) 0.025 % ointment APPLY ONE APPLICATION TOPICALLY TWICE A DAY (Patient taking differently: Apply 1 application topically 2 (two) times daily as needed (rash).) 30 g 0   No current facility-administered medications for this visit.    REVIEW OF SYSTEMS:  [X]  denotes positive finding, [ ]  denotes negative finding Cardiac  Comments:  Chest pain or chest pressure:    Shortness of breath upon exertion:    Short of breath when lying flat:    Irregular heart rhythm:        Vascular    Pain in calf, thigh, or hip brought on by ambulation: x   Pain in feet at night that wakes you up from your sleep:     Blood clot in your veins:    Leg swelling:         Pulmonary    Oxygen at home:    Productive cough:     Wheezing:         Neurologic    Sudden weakness in arms or legs:     Sudden numbness in arms or legs:     Sudden onset of difficulty speaking or slurred speech:    Temporary loss of vision in one eye:     Problems with dizziness:         Gastrointestinal    Blood in stool:     Vomited blood:         Genitourinary    Burning  when urinating:     Blood in urine:        Psychiatric    Major depression:         Hematologic    Bleeding problems:    Problems with blood clotting too easily:        Skin    Rashes or ulcers:        Constitutional    Fever or chills:     PHYSICAL EXAM:   There were no vitals filed for this visit.  Constitutional: well appearing in no distress. Appears well nourished.  Neurologic: CN intact. No focal findings. No sensory loss. Psychiatric: Mood and affect symmetric and appropriate. Eyes: No icterus. No conjunctival pallor. Ears, nose, throat: mucous membranes moist. Midline trachea.  Cardiac: regular rate and rhythm.  Respiratory: unlabored. Abdominal: soft, non-tender, non-distended.  Peripheral vascular:  1+ femoral pulses bilaterally  No palpable pedal pulses Extremity: No edema. No cyanosis. No pallor.  Skin: No gangrene. No ulceration.  Lymphatic: No Stemmer's sign. No palpable lymphadenopathy.    DATA REVIEW:    Most recent CBC CBC Latest Ref Rng & Units 12/03/2021 11/27/2021 11/25/2021  WBC 4.0 - 10.5 K/uL 4.6 9.4 8.4  Hemoglobin 13.0 - 17.0 g/dL 11.1(L) 10.8(L) 10.3(L)  Hematocrit 39.0 - 52.0 % 34.1(L) 33.2(L) 32.4(L)  Platelets 150 - 400 K/uL 265 242 322     Most recent CMP  CMP Latest Ref Rng & Units 12/03/2021 11/25/2021 11/24/2021  Glucose 70 - 99 mg/dL 151(H) 169(H) 126(H)  BUN 8 - 23 mg/dL 21 31(H) 29(H)  Creatinine 0.61 - 1.24 mg/dL 0.79 0.86 0.84  Sodium 135 - 145 mmol/L 129(L) 144 146(H)  Potassium 3.5 - 5.1 mmol/L 4.3 4.0 4.0  Chloride 98 - 111 mmol/L 97(L) 111 116(H)  CO2 22 - 32 mmol/L 22 23 24   Calcium 8.9 - 10.3 mg/dL 9.2 9.4 9.5  Total Protein 6.5 - 8.1 g/dL 8.4(H) 8.0 -  Total Bilirubin 0.3 - 1.2 mg/dL 0.1(L) 0.5 -  Alkaline Phos 38 - 126 U/L 242(H) 262(H) -  AST 15 - 41 U/L 82(H) 78(H) -  ALT 0 - 44 U/L 150(H) 137(H) -    Renal function CrCl cannot be calculated (Patient's most recent lab result is older than the maximum 21 days  allowed.).  Hgb A1c MFr Bld (%)  Date Value  10/26/2021 6.0 (H)    LDL Chol Calc (NIH)  Date Value Ref Range Status  05/12/2021 42 0 - 99 mg/dL Final   LDL Cholesterol  Date Value Ref Range Status  11/04/2021 66 0 - 99 mg/dL Final    Comment:           Total Cholesterol/HDL:CHD Risk Coronary Heart Disease Risk Table                     Men   Women  1/2 Average Risk   3.4   3.3  Average Risk       5.0   4.4  2 X Average Risk   9.6   7.1  3 X Average Risk  23.4   11.0        Use the calculated Patient Ratio above and the CHD Risk Table to determine the patient's CHD Risk.        ATP III CLASSIFICATION (LDL):  <100     mg/dL   Optimal  100-129  mg/dL   Near or Above                    Optimal  130-159  mg/dL   Borderline  160-189  mg/dL   High  >190     mg/dL   Very High Performed at [facility] [address], [place], [place] [address]      Vascular Imaging: +-------+-----------+-----------+------------+------------+   ABI/TBI Today's ABI Today's TBI Previous ABI Previous TBI   +-------+-----------+-----------+------------+------------+   Right   0.57        0.00                                    +-------+-----------+-----------+------------+------------+   Left    0.66        0.32                                    +-------+-----------+-----------+------------+------------+    Yevonne Aline. Stanford Breed, MD [facility] Phone Number: [phone] 01/08/2022 7:59 PM

## 2022-01-09 ENCOUNTER — Other Ambulatory Visit: Payer: Self-pay

## 2022-01-09 ENCOUNTER — Ambulatory Visit (HOSPITAL_COMMUNITY)
Admission: RE | Admit: 2022-01-09 | Discharge: 2022-01-09 | Disposition: A | Discharge status: Home / Self Care | Payer: Medicare Other | Source: Ambulatory Visit | Attending: Vascular Surgery | Admitting: Vascular Surgery

## 2022-01-09 ENCOUNTER — Encounter: Payer: Self-pay | Admitting: Vascular Surgery

## 2022-01-09 ENCOUNTER — Ambulatory Visit (INDEPENDENT_AMBULATORY_CARE_PROVIDER_SITE_OTHER): Payer: Medicare Other | Admitting: Vascular Surgery

## 2022-01-09 VITALS — BP 129/66 | HR 71 | Temp 98.1°F | Resp 20 | Ht 70.0 in

## 2022-01-09 DIAGNOSIS — I739 Peripheral vascular disease, unspecified: Secondary | ICD-10-CM | POA: Diagnosis not present

## 2022-01-09 DIAGNOSIS — I70221 Atherosclerosis of native arteries of extremities with rest pain, right leg: Secondary | ICD-10-CM | POA: Diagnosis not present

## 2022-01-10 DIAGNOSIS — L89623 Pressure ulcer of left heel, stage 3: Secondary | ICD-10-CM | POA: Diagnosis not present

## 2022-01-10 DIAGNOSIS — T80818A Extravasation of other vesicant agent, initial encounter: Secondary | ICD-10-CM | POA: Diagnosis not present

## 2022-01-10 DIAGNOSIS — L89323 Pressure ulcer of left buttock, stage 3: Secondary | ICD-10-CM | POA: Diagnosis not present

## 2022-01-10 DIAGNOSIS — L89153 Pressure ulcer of sacral region, stage 3: Secondary | ICD-10-CM | POA: Diagnosis not present

## 2022-01-11 DIAGNOSIS — I10 Essential (primary) hypertension: Secondary | ICD-10-CM | POA: Diagnosis not present

## 2022-01-11 DIAGNOSIS — R5381 Other malaise: Secondary | ICD-10-CM | POA: Diagnosis not present

## 2022-01-11 DIAGNOSIS — K219 Gastro-esophageal reflux disease without esophagitis: Secondary | ICD-10-CM | POA: Diagnosis not present

## 2022-01-11 DIAGNOSIS — E119 Type 2 diabetes mellitus without complications: Secondary | ICD-10-CM | POA: Diagnosis not present

## 2022-01-11 DIAGNOSIS — I739 Peripheral vascular disease, unspecified: Secondary | ICD-10-CM | POA: Diagnosis not present

## 2022-01-11 DIAGNOSIS — F32A Depression, unspecified: Secondary | ICD-10-CM | POA: Diagnosis not present

## 2022-01-15 DIAGNOSIS — I739 Peripheral vascular disease, unspecified: Secondary | ICD-10-CM | POA: Diagnosis not present

## 2022-01-15 DIAGNOSIS — K219 Gastro-esophageal reflux disease without esophagitis: Secondary | ICD-10-CM | POA: Diagnosis not present

## 2022-01-15 DIAGNOSIS — E119 Type 2 diabetes mellitus without complications: Secondary | ICD-10-CM | POA: Diagnosis not present

## 2022-01-15 DIAGNOSIS — I639 Cerebral infarction, unspecified: Secondary | ICD-10-CM | POA: Diagnosis not present

## 2022-01-15 DIAGNOSIS — I10 Essential (primary) hypertension: Secondary | ICD-10-CM | POA: Diagnosis not present

## 2022-01-15 DIAGNOSIS — M6281 Muscle weakness (generalized): Secondary | ICD-10-CM | POA: Diagnosis not present

## 2022-01-15 DIAGNOSIS — H5440 Blindness, one eye, unspecified eye: Secondary | ICD-10-CM | POA: Diagnosis not present

## 2022-01-15 DIAGNOSIS — I6340 Cerebral infarction due to embolism of unspecified cerebral artery: Secondary | ICD-10-CM | POA: Diagnosis not present

## 2022-01-15 DIAGNOSIS — R5381 Other malaise: Secondary | ICD-10-CM | POA: Diagnosis not present

## 2022-01-16 ENCOUNTER — Encounter: Payer: Self-pay | Admitting: Orthopedic Surgery

## 2022-01-16 ENCOUNTER — Ambulatory Visit: Payer: Self-pay

## 2022-01-16 ENCOUNTER — Other Ambulatory Visit: Payer: Self-pay

## 2022-01-16 ENCOUNTER — Ambulatory Visit (INDEPENDENT_AMBULATORY_CARE_PROVIDER_SITE_OTHER): Payer: Medicare Other | Admitting: Orthopedic Surgery

## 2022-01-16 VITALS — BP 133/72 | HR 69 | Ht 70.0 in

## 2022-01-16 DIAGNOSIS — M25531 Pain in right wrist: Secondary | ICD-10-CM

## 2022-01-16 DIAGNOSIS — S62101A Fracture of unspecified carpal bone, right wrist, initial encounter for closed fracture: Secondary | ICD-10-CM | POA: Diagnosis not present

## 2022-01-16 NOTE — Progress Notes (Signed)
Office Visit Note   Patient: Jeffrey Mullen           Date of Birth: 1951-03-25           MRN: [id number] Visit Date: 01/16/2022              Requested by: Samella Parr, NP [address] [place],  [place] [address] PCP: [name], NP   Assessment & Plan: Visit Diagnoses:  1. Pain in right wrist   2. Avulsion fracture of right wrist     Plan: Discussed with patient and the nurse from the facility that he because he has no pain in his right wrist, I would not offer any form of immobilization or other treatment.  He has reasonable range of motion of the wrist without pain.  He is actually unsure why he is seeing me here today.  Regarding the left wrist, he has a wound going from the distal aspect of the dorsal forearm across the wrist crease to the dorsal aspect of the hand.  There is been treated with wound care at the facility.  There is no exposed tendon or other deeper structure.  Wound is healing without complication.  We will continue wound care per the facility.  He can see me back as needed.  Follow-Up Instructions: No follow-ups on file.   Orders:  Orders Placed This Encounter  Procedures   XR Wrist Complete Right   No orders of the defined types were placed in this encounter.     Procedures: No procedures performed   Clinical Data: No additional findings.   Subjective: Chief Complaint  Patient presents with   Right Wrist - Fracture    RIGHT handed, DOI: 10/24/2021, Pain 8/10    Is a 71 year old right-hand-dominant male who presents for follow-up of a avulsion type fracture in the right wrist.  He says that this injury probably occurred sometime in November when he had a stroke and was transferred to the hospital.  He is not sure what happened but is worried that he was dropped or he otherwise fell during transport.  He has no pain in his wrist today.  He has no pain with range of motion or use of this wrist.  He is actually unsure why he is  seeing me today.  He describes a wound on the dorsal aspect of his hand and wrist from some complication of an IV placement.  He been getting wound care at the facility.  Dressing was taken down there is no exposed tendon or other deeper structure.  Wound actually healing well.   Review of Systems   Objective: Vital Signs: BP 133/72 (BP Location: Left Arm, Patient Position: Sitting)    Pulse 69    Ht 5\' 10"  (1.778 m)    BMI 38.31 kg/m   Physical Exam Constitutional:      Appearance: Normal appearance.  Cardiovascular:     Rate and Rhythm: Normal rate.     Pulses: Normal pulses.  Pulmonary:     Effort: Pulmonary effort is normal.  Skin:    General: Skin is warm and dry.     Capillary Refill: Capillary refill takes less than 2 seconds.  Neurological:     Mental Status: He is alert.    Right Hand Exam   Tenderness  The patient is experiencing no tenderness.   Other  Erythema: absent Sensation: normal Pulse: present  Comments:  Painless range of motion of the wrist  with 45 degrees of extension and 40 degrees of flexion.  He has no pain with full passive range of motion.  Able to make a complete fist.   Left Hand Exam   Tenderness  The patient is experiencing no tenderness.   Other  Sensation: normal Pulse: present  Comments:  Healing wound on dorsal aspect of the distal forearm across the wrist crease to the dorsal aspect of the hand.  There is dried skin in the area.  There is no exposed tendon or other deeper structure that would require wound coverage.     Specialty Comments:  No specialty comments available.  Imaging: No results found.   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[facility])    Hypokalemia    Pressure injury of skin 11/08/2021   AKI (acute kidney injury) ([facility])    Seizure ([facility])    Cryptogenic stroke ([place])    Acute respiratory failure with hypercapnia ([facility])    Acute encephalopathy    Status epilepticus ([facility]) 10/23/2021   Cataract Right eye 12/12/2018   Glaucoma, congenital, blind Left eye  12/12/2018   Shoulder pain, right 12/12/2018   Tobacco abuse    ETOH abuse    Benign essential HTN    Hepatic cirrhosis ([facility]) 02/10/2015   Chronic hepatitis C without hepatic coma ([facility]) 01/05/2015   Past Medical History:  Diagnosis Date   Alcohol abuse    Allergy    Anemia    Anxiety    Arthritis    Blind left eye    Cataract, right eye    Congenital blindness    LEFT EYE   Depression    Glaucoma, bilateral    Hepatic cirrhosis ([facility])    Hepatitis    HEP C treated in 2016   HTN (hypertension)    Seizures ([facility])    3 years ago    Family History  Problem Relation Age of Onset   Hearing loss Other    Kidney disease Neg Hx    Colon cancer Neg Hx    Esophageal cancer Neg Hx    Rectal cancer Neg Hx    Stomach cancer Neg Hx     Past Surgical History:  Procedure Laterality Date   CATARACT EXTRACTION Left    GLAUCOMA SURGERY     Patient reports 10 or eleven surgeries for Glaucoma   Social History   Occupational History   Not on file  Tobacco Use   Smoking status: Former    Packs/day: 0.25    Types: Cigarettes    Quit date: 08/24/2021    Years since quitting: 0.3   Smokeless tobacco: Never  Vaping Use   Vaping Use: Never used  Substance and Sexual Activity   Alcohol use: Yes    Alcohol/week: 5.0 standard drinks    Types: 5 Cans of beer per week    Comment: twice a month   Drug use: No   Sexual activity: Not Currently

## 2022-01-19 ENCOUNTER — Encounter (HOSPITAL_COMMUNITY): Admission: RE | Payer: Self-pay | Source: Ambulatory Visit

## 2022-01-19 ENCOUNTER — Ambulatory Visit (HOSPITAL_COMMUNITY): Admission: RE | Admit: 2022-01-19 | Payer: Medicare Other | Source: Ambulatory Visit | Admitting: Vascular Surgery

## 2022-01-19 SURGERY — ABDOMINAL AORTOGRAM W/LOWER EXTREMITY
Anesthesia: LOCAL

## 2022-01-19 NOTE — Progress Notes (Signed)
Unable to reach patient.  Nursing home states he was discharged on 01/16/22.   Called next of kin list she has not heard from patient either.  Spoke with Social worker at the nursing home, she states they have been trying to contact patient as well regarding the outpatient services that were set up. A welfare check was done to the [employer].  [employer] called back said Jeffrey Mullen was fine, he is having issues with his phone, he is unable to make calls on it or receive calls.

## 2022-01-22 ENCOUNTER — Telehealth: Payer: Self-pay | Admitting: Nurse Practitioner

## 2022-01-22 NOTE — Telephone Encounter (Signed)
Copied from [place] [id number]. Topic: Quick Communication - Home Health Verbal Orders >> Jan 22, 2022 12:57 PM Leward Quan A wrote: Caller/Agency: [facility] Number: [phone] [place] [facility]  Requesting OT/PT/Skilled Nursing/Social Work/Speech Therapy: Skilled Nursing, PT, OT  Frequency: To start on 01/24/22

## 2022-01-22 NOTE — Progress Notes (Deleted)
Synopsis: Referred for post-hospitalization follow up of acute hypoxic respiratory failure with prolonged mechanical ventilation by Samella Parr, NP  Subjective:   PATIENT ID: Jeffrey Mullen GENDER: male DOB: 15-Jun-1951, MRN: [id number]  No chief complaint on file.  72yM with cirrhosis, alcohol use, multifocal CVA, presumed COPD, recent hospitalization for AHRF in setting seizures/status c/b VDRF s/p trach and decannulation 12/20 followed by PCCM  Otherwise pertinent review of systems is negative.  Past Medical History:  Diagnosis Date   Alcohol abuse    Allergy    Anemia    Anxiety    Arthritis    Blind left eye    Cataract, right eye    Congenital blindness    LEFT EYE   Depression    Glaucoma, bilateral    Hepatic cirrhosis ([facility] [facility])    Hepatitis    HEP C treated in 2016   HTN (hypertension)    Seizures ([place])    3 years ago     Family History  Problem Relation Age of Onset   Hearing loss Other    Kidney disease Neg Hx    Colon cancer Neg Hx    Esophageal cancer Neg Hx    Rectal cancer Neg Hx    Stomach cancer Neg Hx      Past Surgical History:  Procedure Laterality Date   CATARACT EXTRACTION Left    GLAUCOMA SURGERY     Patient reports 10 or eleven surgeries for Glaucoma    Social History   Socioeconomic History   Marital status: Single    Spouse name: Not on file   Number of children: Not on file   Years of education: Not on file   Highest education level: Not on file  Occupational History   Not on file  Tobacco Use   Smoking status: Former    Packs/day: 0.25    Types: Cigarettes    Quit date: 08/24/2021    Years since quitting: 0.4   Smokeless tobacco: Never  Vaping Use   Vaping Use: Never used  Substance and Sexual Activity   Alcohol use: Yes    Alcohol/week: 5.0 standard drinks    Types: 5 Cans of beer per week    Comment: twice a month   Drug use: No   Sexual activity: Not Currently  Other Topics Concern   Not on file  Social  History Narrative   Not on file   Social Determinants of Health   Financial Resource Strain: Not on file  Food Insecurity: Not on file  Transportation Needs: Not on file  Physical Activity: Not on file  Stress: Not on file  Social Connections: Not on file  Intimate Partner Violence: Not on file     No Known Allergies   Outpatient Medications Prior to Visit  Medication Sig Dispense Refill   acetaminophen (TYLENOL) 325 MG tablet Take 2 tablets (650 mg total) by mouth every 4 (four) hours as needed for fever or mild pain.     amLODipine (NORVASC) 10 MG tablet Take 1 tablet (10 mg total) by mouth daily. 90 tablet 1   arformoterol (BROVANA) 15 MCG/2ML NEBU Take 2 mLs (15 mcg total) by nebulization 2 (two) times daily. 120 mL    ascorbic acid (VITAMIN C) 500 MG tablet Take 500 mg by mouth daily.     aspirin 81 MG chewable tablet Chew 1 tablet (81 mg total) by mouth daily. 30 tablet 3   atropine 1 % ophthalmic solution Place 1 drop  into the left eye 2 (two) times daily. 15 mL 2   busPIRone (BUSPAR) 15 MG tablet TAKE ONE TABLET BY MOUTH THREE TIMES A DAY (Patient taking differently: Take 15 mg by mouth 3 (three) times daily.) 90 tablet 0   carbamide peroxide (DEBROX) 6.5 % OTIC solution 3 drops every 8 (eight) hours as needed (Wax build-up).     carvedilol (COREG) 12.5 MG tablet Take 1 tablet (12.5 mg total) by mouth 2 (two) times daily with a meal.     Cholecalciferol (VITAMIN D) 50 MCG (2000 UT) CAPS Take 2,000 Units by mouth daily.     collagenase (SANTYL) ointment Apply topically 2 (two) times daily. (Patient not taking: Reported on 01/18/2022) 15 g 0   docusate sodium (COLACE) 100 MG capsule Take 1 capsule (100 mg total) by mouth daily as needed for mild constipation. 10 capsule 0   dorzolamide-timolol (COSOPT) 22.3-6.8 MG/ML ophthalmic solution Place 1 drop into the right eye 2 (two) times daily.     fiber (NUTRISOURCE FIBER) PACK packet Take 1 packet by mouth 2 (two) times daily.  (Patient not taking: Reported on 06/06/4314)     folic acid (FOLVITE) 1 MG tablet Take 1 tablet (1 mg total) by mouth daily.     guaiFENesin (MUCINEX) 600 MG 12 hr tablet Take 2 tablets (1,200 mg total) by mouth 2 (two) times daily. (Patient taking differently: Take 400 mg by mouth 3 (three) times daily.)     insulin aspart (NOVOLOG) 100 UNIT/ML injection Inject 0-15 Units into the skin 3 (three) times daily with meals. Do NOT hold insulin if patient is NPO. Moderate Scale. (Patient not taking: Reported on 01/18/2022) 10 mL 11   insulin lispro (HUMALOG) 100 UNIT/ML KwikPen Inject 4-10 Units into the skin 3 (three) times daily. Inject as per sliding scale If 200-250=4 units 251-300=6 Units 301-350= 8 units 351-400= 10 units     ipratropium-albuterol (DUONEB) 0.5-2.5 (3) MG/3ML SOLN Take 3 mLs by nebulization every 4 (four) hours as needed. 360 mL    lacosamide (VIMPAT) 200 MG TABS tablet Take 1 tablet (200 mg total) by mouth 2 (two) times daily. 60 tablet    latanoprost (XALATAN) 0.005 % ophthalmic solution Place 1 drop into the right eye at bedtime.     levETIRAcetam (KEPPRA) 100 MG/ML solution Take 5 mLs (500 mg total) by mouth 2 (two) times daily. 473 mL 12   loperamide (IMODIUM) 2 MG capsule Take 2 capsules (4 mg total) by mouth as needed for diarrhea or loose stools. 30 capsule 0   loratadine (CLARITIN) 10 MG tablet Take 10 mg by mouth daily.     Multiple Vitamin (MULTIVITAMIN WITH MINERALS) TABS tablet Take 1 tablet by mouth daily.     nicotine (NICODERM CQ - DOSED IN MG/24 HOURS) 14 mg/24hr patch Place 1 patch (14 mg total) onto the skin daily. (Patient not taking: Reported on 01/18/2022) 28 patch 0   pantoprazole (PROTONIX) 40 MG tablet Take 1 tablet (40 mg total) by mouth daily.     polyethylene glycol (MIRALAX / GLYCOLAX) 17 g packet Place 17 g into feeding tube daily as needed for moderate constipation. 14 each 0   PROTEIN PO Take 30 mLs by mouth 2 (two) times daily. Supplement      revefenacin (YUPELRI) 175 MCG/3ML nebulizer solution Take 3 mLs (175 mcg total) by nebulization daily. (Patient not taking: Reported on 01/18/2022) 90 mL    saccharomyces boulardii (FLORASTOR) 250 MG capsule Take 1 capsule (250 mg total)  by mouth 2 (two) times daily.     tacrolimus (PROTOPIC) 0.1 % ointment APPLY TO FACE NIGHTLY (Patient taking differently: Apply 1 application topically at bedtime. Apply to face) 60 g 1   thiamine 100 MG tablet Take 1 tablet (100 mg total) by mouth daily.     Tiotropium Bromide Monohydrate (SPIRIVA RESPIMAT) 2.5 MCG/ACT AERS Inhale 2 puffs into the lungs daily.     traMADol (ULTRAM) 50 MG tablet Take 50 mg by mouth 2 (two) times daily.     triamcinolone (KENALOG) 0.025 % ointment APPLY ONE APPLICATION TOPICALLY TWICE A DAY (Patient taking differently: Apply 1 application topically 2 (two) times daily.) 30 g 0   No facility-administered medications prior to visit.       Objective:   Physical Exam:  General appearance: 71 y.o., male, NAD, conversant  Eyes: anicteric sclerae; PERRL, tracking appropriately HENT: NCAT; MMM Neck: Trachea midline; no lymphadenopathy, no JVD Lungs: CTAB, no crackles, no wheeze, with normal respiratory effort CV: RRR, no murmur  Abdomen: Soft, non-tender; non-distended, BS present  Extremities: No peripheral edema, warm Skin: Normal turgor and texture; no rash Psych: Appropriate affect Neuro: Alert and oriented to person and place, no focal deficit     There were no vitals filed for this visit.   on *** LPM *** RA BMI Readings from Last 3 Encounters:  01/16/22 38.31 kg/m  01/09/22 38.31 kg/m  12/09/21 38.31 kg/m   Wt Readings from Last 3 Encounters:  12/09/21 266 lb 15.6 oz (121.1 kg)  09/26/21 180 lb (81.6 kg)  05/12/21 175 lb 9.6 oz (79.7 kg)     CBC    Component Value Date/Time   WBC 4.6 12/03/2021 0237   RBC 3.96 (L) 12/03/2021 0237   HGB 11.1 (L) 12/03/2021 0237   HGB 12.9 (L) 05/12/2021 0921   HCT  34.1 (L) 12/03/2021 0237   HCT 38.7 05/12/2021 0921   PLT 265 12/03/2021 0237   PLT 295 05/12/2021 0921   MCV 86.1 12/03/2021 0237   MCV 86 05/12/2021 0921   MCH 28.0 12/03/2021 0237   MCHC 32.6 12/03/2021 0237   RDW 16.1 (H) 12/03/2021 0237   RDW 15.8 (H) 05/12/2021 0921   LYMPHSABS 3.0 11/27/2021 0831   MONOABS 1.1 (H) 11/27/2021 0831   EOSABS 0.2 11/27/2021 0831   BASOSABS 0.0 11/27/2021 0831    Eos 100-200  Chest Imaging: CXR 12/06/21 reviewed by me unremarkable  Pulmonary Functions Testing Results: No flowsheet data found.  Echocardiogram:   TTE 11/03/21: Left ventricular ejection fraction, by estimation, is 60 to 65%. The left ventricle has normal function. The left ventricle has no regional wall motion abnormalities. There is mild left ventricular hypertrophy. Left ventricular diastolic parameters are consistent with Grade I diastolic dysfunction (impaired relaxation). 1. Right ventricular systolic function is normal. The right ventricular size is normal. Tricuspid regurgitation signal is inadequate for assessing PA pressure. 2.Possible PFO by doppler. 3.  4. No evidence of mitral valve regurgitation. 5. Aortic valve regurgitation is not visualized. The inferior vena cava is normal in size with greater than 50% respiratory variability, suggesting right atrial pressure of 3 mmHg.  Heart Catheterization: ***    Assessment & Plan:    Plan:      Maryjane Hurter, MD [facility] 01/22/2022 1:23 PM

## 2022-01-23 ENCOUNTER — Telehealth: Payer: Self-pay

## 2022-01-23 ENCOUNTER — Institutional Professional Consult (permissible substitution): Payer: Medicare Other | Admitting: Student

## 2022-01-23 NOTE — Telephone Encounter (Signed)
Patrice Paradise and gave her the ok to start on 01/24/2022

## 2022-01-23 NOTE — Telephone Encounter (Signed)
Called and gave verbal order to start OT 01/24/2022

## 2022-01-24 ENCOUNTER — Telehealth: Payer: Self-pay | Admitting: Nurse Practitioner

## 2022-01-24 DIAGNOSIS — J9601 Acute respiratory failure with hypoxia: Secondary | ICD-10-CM | POA: Diagnosis not present

## 2022-01-24 DIAGNOSIS — I10 Essential (primary) hypertension: Secondary | ICD-10-CM | POA: Diagnosis not present

## 2022-01-24 DIAGNOSIS — E1151 Type 2 diabetes mellitus with diabetic peripheral angiopathy without gangrene: Secondary | ICD-10-CM | POA: Diagnosis not present

## 2022-01-24 DIAGNOSIS — F32A Depression, unspecified: Secondary | ICD-10-CM | POA: Diagnosis not present

## 2022-01-24 DIAGNOSIS — J40 Bronchitis, not specified as acute or chronic: Secondary | ICD-10-CM | POA: Diagnosis not present

## 2022-01-24 NOTE — Telephone Encounter (Signed)
Lele from [facility], called in to inform PCP that pt started services as of today. For disease process teaching, once a week for nine weeks.Lele stated pt has a wound on his left heel.  Stated pt does not have any of his medications at this time, he does not have transportation, and he is working with social services.

## 2022-01-25 NOTE — Telephone Encounter (Signed)
Noted  

## 2022-01-26 ENCOUNTER — Telehealth: Payer: Self-pay

## 2022-01-26 NOTE — Telephone Encounter (Signed)
Reason for CRM: [facility] with [facility] stated they are not going to be able to see the patient. Per Jeffrey Mullen the pt does not answer the phone or come to the door. Cb# [phone]

## 2022-01-31 NOTE — Progress Notes (Deleted)
Patient ID: Jeffrey Mullen, male   DOB: 03-10-1951, 71 y.o.   MRN: [id number]  After hospitalization 10/23/2021-12/14/2021.  Seen by vein and vascular 01/09/2022.  Seen by ortho 01/16/2022(see A/P both below.   From discharge summary Recommendations for Outpatient Follow-up:  Patient will need to follow-up with neurology at the stroke clinic/Dr. Leonie Man.  Please call and arrange an outpatient appointment for 1 to 2 weeks after discharge to your facility.  [phone] Ambulatory referral has been sent to [facility] pulmonology group for outpatient follow-up.  Patient recently decannulated from having a tracheostomy tube and PFTs were recommended by the inpatient pulmonologist. Patient will need to follow-up with orthopedic group after discharge to your facility.  An ambulatory referral has been sent. Patient has known PVD and continues to have right lower extremity claudication with ambulation.  Ambulatory referral sent to vascular surgery. He will discharge to [facility] in [place] to the skilled nursing facility Patient has had issues with transaminitis which appears to have been worsened recently by side effects from Bactrim.  Patient will need follow-up LFTs as well as electrolyte panel in 2 weeks after arrival to your facility On date of discharge at recommendation of orthopedic team patient has been started on scheduled Naprosyn for total of 14 days     Discharge Diagnoses:  Active Problems:   Status epilepticus (HCC)   Acute respiratory failure with hypercapnia (HCC)   Acute encephalopathy   Cryptogenic stroke (HCC)   Pressure injury of skin   AKI (acute kidney injury) ([place])   Seizure (HCC)   Acute respiratory failure with hypoxia (HCC)   Status post tracheostomy ([place])   Hypokalemia   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)   Oropharyngeal dysphagia   Transaminitis   Tracheostomy care ([place])   Tracheobronchitis   Decreased range of motion of right wrist   Right  calf pain     Discharge Condition: Stable   Diet recommendation: Soft diet with thin liquids        Filed Weights    11/17/21 0408 11/18/21 0423 12/09/21 0407  Weight: 77.3 kg 73.7 kg 121.1 kg      History of present illness:  71 y/o M with past medical history of hypertension, glaucoma who presented to [facility] ER on 10/31 via EMS with reports of altered mental status.  Given  right sided gaze, altered and left sided weakness, code stoke called. The patient was taken emergently to CT for imaging which was negative for LVO.  While in the [facility], he had a witnessed seizure.  PCCM called for ICU admission. Chart review shows he was seen in the ER on 10/4 after passing out at the [employer].  Physical exam was negative at the time.  Labs negative.  No further imaging obtained.    Hospital Course:  Acute multifocal bilateral embolic stroke TTE: No embolic source was identified.  TEE was attempted but could not be performed successfully. LDL 66.  HbA1c 6.0.   Continue aspirin -statin on hold secondary to transaminitis Noted to be quite deconditioned.    Acute right wrist pain 2/2 age-indeterminate fracture No obvious injury but clearly has decreased range of motion especially with flexion and flexion elicits a pain response X-ray wrist demonstrates age-indeterminate fracture-fragment lateral to the lunate and inferior to the triquetrum Orthopedics consultation Toradol IV x1 dose given 12/19.  Therapeutic service recommending starting Naprosyn 500 mg BID 10 days.  This was initiated on date of discharge   Right calf pain/Known  PVD Venous duplex unremarkable except for incidental finding of known SFA occlusion/significant stenosis with reconstitution at collaterals. Ambulatory referral has been sent to vascular surgery for outpatient follow-up   Alcoholic liver cirrhosis PCP notes document history of alcohol abuse and known cirrhosis LFTS variable but typically normal to only mildly  elevated w/ normal TB Past two readings AST 70-80 range and ALT in the 150s-commend repeat LFTs 2 weeks after arrival to facility Statin and and Bactrim discontinued   Status epilepticus Resolved.   Continue Keppra and lacosamide.     Acute metabolic encephalopathy Resolved   Sepsis likely secondary to tracheitis Trach cultures were positive for MSSA and completed all antibiotics by 11/28 Repeat sputum culture on 12 /1 MSSA -due to resistance pattern Bactrim initiated but dc'd on D # 6 (12/12) 2/2 transaminitis 12/15 PCCM documented patient w/ increased secretions requiring more frequent suctioning and suspected another episode of acute tracheobronchitis.  Doxycycline given x 3 days per pulmonary medicine Has subsequently been decannulated   Tracheostomy dependent Decannulated as of 12/20   Dysphagia Stable on soft diet with thin liquids   Left cephalic vein superficial thrombophlebitis/left arm extravasation injury (dorsal surface) Status post phentolamine and nitroglycerin application.   Continue local wound /hydrotherapy noting significant improvement in appearance of the wound as of 12/7 Hydrotherapy discontinued on 12/14-see wound care orders   Hypernatremia/high volume stool output Rectal tube discontinued 12/13 Sodium 129 on 12/11   Physical deconditioning SNF for rehab   Essential hypertension/grade 2 diastolic dysfunction Continue amlodipine, carvedilol  Anemia of critical illness Hemoglobin 10.7 on 11/28   Hypokalemia Resolved with potassium 4.2 as of 11/29  Acute kidney injury due to contrast-induced ATN Resolved.   Current creatinine 0.86  Nontraumatic rhabdomyolysis Resolved   Goals of care Former significant other requested that she no longer wanted to be the surrogate decision maker.  Previously his biological daughter has also mentioned that she does not want to be the primary decision maker.  Social worker to help conduct search for alternative  family members/next of kin. After discussions with critical care medicine and with two-physician documentation patient's CODE STATUS was changed to DNR.  See note from 11/27.   Procedures: EEG Echocardiogram a bubble study Core track Tracheostomy Lumbar puncture in interventional radiology Venous duplex right lower extremity    Vein and vascular A/P 01/09/2022 71 y.o. male with atherosclerosis of native arteries of bilateral lower extremities causing right lower extremity ischemic rest pain.   Recommend the following which can slow the progression of atherosclerosis and reduce the risk of major adverse cardiac / limb events:  Complete cessation from all tobacco products. Blood glucose control with goal A1c < 7%. Blood pressure control with goal blood pressure < 140/90 mmHg. Lipid reduction therapy with goal LDL-C <100 mg/dL (<70 if symptomatic from PAD).  Aspirin 81mg  PO QD.  Atorvastatin 40-80mg  PO QD (or other "high intensity" statin therapy).   Plan right lower extremity angiogram from left common femoral artery approach in cath lab 01/19/22.   Assessment & Plan(ortho 01/16/2022: Visit Diagnoses:  1. Pain in right wrist   2. Avulsion fracture of right wrist       Plan: Discussed with patient and the nurse from the facility that he because he has no pain in his right wrist, I would not offer any form of immobilization or other treatment.  He has reasonable range of motion of the wrist without pain.  He is actually unsure why he is seeing me here today.  Regarding the left wrist, he has a wound going from the distal aspect of the dorsal forearm across the wrist crease to the dorsal aspect of the hand.  There is been treated with wound care at the facility.  There is no exposed tendon or other deeper structure.  Wound is healing without complication.  We will continue wound care per the facility.  He can see me back as needed.

## 2022-02-01 ENCOUNTER — Ambulatory Visit: Payer: Medicare Other | Admitting: Physician Assistant

## 2022-02-01 DIAGNOSIS — R7303 Prediabetes: Secondary | ICD-10-CM

## 2022-02-07 ENCOUNTER — Telehealth: Payer: Self-pay | Admitting: Nurse Practitioner

## 2022-02-07 DIAGNOSIS — M6281 Muscle weakness (generalized): Secondary | ICD-10-CM | POA: Diagnosis not present

## 2022-02-07 NOTE — Telephone Encounter (Unsigned)
Copied from [place] [id number]. Topic: General - Other >> Feb 07, 2022 11:27 AM [place], Apolonio Schneiders wrote: [place] with adult protective services had some questions regarding the pt, caller wanted to know if [name] was going to continue doing home health orders for the pt, please advise.

## 2022-02-13 ENCOUNTER — Inpatient Hospital Stay: Payer: Self-pay | Admitting: Neurology

## 2022-02-13 NOTE — Telephone Encounter (Signed)
TELE VISIT NEXT Tuesday> I am unsure why he requires home health

## 2022-02-14 ENCOUNTER — Telehealth: Payer: Self-pay

## 2022-02-14 NOTE — Telephone Encounter (Signed)
Called pt and number no longer in service. The other number stated he is no longer in the facility.

## 2022-02-19 ENCOUNTER — Ambulatory Visit: Payer: Medicare Other | Admitting: Dermatology

## 2022-02-20 NOTE — Telephone Encounter (Signed)
Laquita from social services, calling about next steps since t was discharged from [facility] and needs higher level of care

## 2022-02-22 ENCOUNTER — Telehealth: Payer: Self-pay

## 2022-02-22 NOTE — Telephone Encounter (Signed)
Pt is needing referral to home health due to [facility] form not being able to assist patient. ? ?One referral is placed please call Lakita back at number provided. ?

## 2022-02-22 NOTE — Telephone Encounter (Signed)
Please call the number listed below for social services. Find out what they need and let Opal Sidles know. dept of social serv [phone]  ?

## 2022-02-22 NOTE — Telephone Encounter (Signed)
I called Lakita/ DSS - APS caseworker. She explained that the patient was hospitalized and discharged to a facility and then recently discharged home.  She stated that [facility] saw him once and determined that he needs a higher level of care and she would like to know if he can be referred to another home health agency.  I explained that we need to determine if home health is really appropriate or does he truly  need a higher level of care.  Home Health services are short term and there is no guarantee that another agency would accept a referral.  PCS might not be able to provide enough service to allow him to remain safely at home.  ? ?Marsh Dolly said that he seems to have made a complete turnaround from when he was in the hospital, he is doing much better, ambulating with a rollator.  I told her that he will need to be seen by his PCP prior to making any referrals. He has not been seen by PCP since 04/2021.  Marsh Dolly stated that she will contact him and instruct him to call this office to schedule an appointment.   ? ?She inquired about transportation for him and I said he can contact [facility] Medicare and DSS and schedule rides to medical appointments.  ?

## 2022-02-22 NOTE — Telephone Encounter (Signed)
Called and left a vm to return call to the office. ?

## 2022-02-22 NOTE — Telephone Encounter (Signed)
Called and spoke to social service going to give Jeffrey Mullen the number to help. ?

## 2022-02-23 NOTE — Telephone Encounter (Signed)
Noted  

## 2022-02-23 NOTE — Telephone Encounter (Signed)
Thanks JANE!! ?

## 2022-03-12 ENCOUNTER — Telehealth: Payer: Self-pay | Admitting: Nurse Practitioner

## 2022-03-12 DIAGNOSIS — G8929 Other chronic pain: Secondary | ICD-10-CM

## 2022-03-13 NOTE — Telephone Encounter (Signed)
Discontinued 12/14/21 ?Requested Prescriptions  ?Pending Prescriptions Disp Refills  ?? ibuprofen (ADVIL) 800 MG tablet [Pharmacy Med Name: IBUPROFEN 800MG TAB] 60 tablet 0  ?  Sig: TAKE ONE TABLET BY MOUTH EVERY 8 HOURS AS NEEDED  ?  ? Analgesics:  NSAIDS Failed - 03/12/2022 10:14 AM  ?  ?  Failed - Manual Review: Labs are only required if the patient has taken medication for more than 8 weeks.  ?  ?  Failed - HGB in normal range and within 360 days  ?  Hemoglobin  ?Date Value Ref Range Status  ?12/03/2021 11.1 (L) 13.0 - 17.0 g/dL Final  ?05/12/2021 12.9 (L) 13.0 - 17.7 g/dL Final  ?   ?  ?  Failed - HCT in normal range and within 360 days  ?  HCT  ?Date Value Ref Range Status  ?12/03/2021 34.1 (L) 39.0 - 52.0 % Final  ? ?Hematocrit  ?Date Value Ref Range Status  ?05/12/2021 38.7 37.5 - 51.0 % Final  ?   ?  ?  Passed - Cr in normal range and within 360 days  ?  Creat  ?Date Value Ref Range Status  ?12/09/2014 1.01 0.50 - 1.35 mg/dL Final  ? ?Creatinine, Ser  ?Date Value Ref Range Status  ?12/03/2021 0.79 0.61 - 1.24 mg/dL Final  ? ?Creatinine, Urine  ?Date Value Ref Range Status  ?10/28/2021 193.33 mg/dL Final  ?  Comment:  ?  Performed at [facility], [place] [address], [place], [place] [address]  ?   ?  ?  Passed - PLT in normal range and within 360 days  ?  Platelets  ?Date Value Ref Range Status  ?12/03/2021 265 150 - 400 K/uL Final  ?05/12/2021 295 150 - 450 x10E3/uL Final  ?   ?  ?  Passed - eGFR is 30 or above and within 360 days  ?  GFR calc Af Amer  ?Date Value Ref Range Status  ?12/07/2020 80 >59 mL/min/1.73 Final  ?  Comment:  ?  **In accordance with recommendations from the NKF-ASN Task force,** ?  [facility] is in the process of updating its eGFR calculation to the ?  2021 CKD-EPI creatinine equation that estimates kidney function ?  without a race variable. ?  ? ?GFR, Estimated  ?Date Value Ref Range Status  ?12/03/2021 >60 >60 mL/min Final  ?  Comment:  ?  (NOTE) ?Calculated using the CKD-EPI  Creatinine Equation (2021) ?  ? ?eGFR  ?Date Value Ref Range Status  ?05/12/2021 77 >59 mL/min/1.73 Final  ?   ?  ?  Passed - Patient is not pregnant  ?  ?  Passed - Valid encounter within last 12 months  ?  Recent Outpatient Visits   ?      ? 10 months ago Benign essential HTN  ? [place], Vernia Buff, NP  ? 1 year ago Essential hypertension  ? [place], Vernia Buff, NP  ? 1 year ago Squamous papilloma  ? [place] Jodi Marble, MD  ? 1 year ago Essential hypertension  ? [place], Vernia Buff, NP  ? 1 year ago Essential hypertension  ? [place] [name], NP  ?  ?  ?Future Appointments   ?        ? In 2 weeks Thereasa Solo [facility] Bilis [facility]  ?  ? ?  ?  ?  ? ? ?

## 2022-03-21 ENCOUNTER — Other Ambulatory Visit: Payer: Self-pay | Admitting: Nurse Practitioner

## 2022-03-21 MED ORDER — IBUPROFEN 800 MG PO TABS: 800.0000 mg | ORAL_TABLET | Freq: Three times a day (TID) | ORAL | 0 refills | Status: DC | PRN

## 2022-03-21 NOTE — Telephone Encounter (Signed)
Pt called in to follow up on his refill request.  ?

## 2022-03-29 ENCOUNTER — Ambulatory Visit: Payer: Medicare Other | Admitting: Physician Assistant

## 2022-03-29 DIAGNOSIS — E1165 Type 2 diabetes mellitus with hyperglycemia: Secondary | ICD-10-CM

## 2022-04-04 ENCOUNTER — Telehealth: Payer: Self-pay

## 2022-04-04 ENCOUNTER — Ambulatory Visit: Payer: Medicare Other | Attending: Physician Assistant | Admitting: Physician Assistant

## 2022-04-04 VITALS — BP 137/71 | HR 79 | Resp 16 | Wt 153.8 lb

## 2022-04-04 DIAGNOSIS — I10 Essential (primary) hypertension: Secondary | ICD-10-CM

## 2022-04-04 DIAGNOSIS — T8089XD Other complications following infusion, transfusion and therapeutic injection, subsequent encounter: Secondary | ICD-10-CM | POA: Diagnosis not present

## 2022-04-04 DIAGNOSIS — Z09 Encounter for follow-up examination after completed treatment for conditions other than malignant neoplasm: Secondary | ICD-10-CM | POA: Diagnosis not present

## 2022-04-04 DIAGNOSIS — R7401 Elevation of levels of liver transaminase levels: Secondary | ICD-10-CM | POA: Diagnosis not present

## 2022-04-04 DIAGNOSIS — R569 Unspecified convulsions: Secondary | ICD-10-CM

## 2022-04-04 DIAGNOSIS — R531 Weakness: Secondary | ICD-10-CM | POA: Diagnosis not present

## 2022-04-04 DIAGNOSIS — F419 Anxiety disorder, unspecified: Secondary | ICD-10-CM

## 2022-04-04 DIAGNOSIS — I96 Gangrene, not elsewhere classified: Secondary | ICD-10-CM | POA: Diagnosis not present

## 2022-04-04 DIAGNOSIS — E785 Hyperlipidemia, unspecified: Secondary | ICD-10-CM

## 2022-04-04 DIAGNOSIS — E1165 Type 2 diabetes mellitus with hyperglycemia: Secondary | ICD-10-CM | POA: Diagnosis not present

## 2022-04-04 LAB — POCT GLYCOSYLATED HEMOGLOBIN (HGB A1C): HbA1c, POC (controlled diabetic range): 6 % (ref 0.0–7.0)

## 2022-04-04 LAB — GLUCOSE, POCT (MANUAL RESULT ENTRY): POC Glucose: 139 mg/dl — AB (ref 70–99)

## 2022-04-04 MED ORDER — AMLODIPINE BESYLATE 10 MG PO TABS: 10.0000 mg | ORAL_TABLET | Freq: Every day | ORAL | 1 refills | Status: DC

## 2022-04-04 MED ORDER — CARVEDILOL 12.5 MG PO TABS: 12.5000 mg | ORAL_TABLET | Freq: Two times a day (BID) | ORAL | 3 refills | Status: DC

## 2022-04-04 MED ORDER — LEVETIRACETAM 100 MG/ML PO SOLN: 500.0000 mg | Freq: Two times a day (BID) | ORAL | 12 refills | Status: DC

## 2022-04-04 MED ORDER — COLLAGENASE 250 UNIT/GM EX OINT: TOPICAL_OINTMENT | Freq: Two times a day (BID) | CUTANEOUS | 2 refills | Status: DC

## 2022-04-04 MED ORDER — PANTOPRAZOLE SODIUM 40 MG PO TBEC: 40.0000 mg | DELAYED_RELEASE_TABLET | Freq: Every day | ORAL | 0 refills | Status: DC

## 2022-04-04 MED ORDER — BUSPIRONE HCL 15 MG PO TABS: 15.0000 mg | ORAL_TABLET | Freq: Three times a day (TID) | ORAL | 3 refills | Status: DC

## 2022-04-04 NOTE — Progress Notes (Signed)
Patient ID: Jeffrey Mullen, male   DOB: July 25, 1951, 71 y.o.   MRN: [id number] ? ? ?Jeffrey Mullen, is a 71 y.o. male ? ?[id number] ? ?[id number] ? ?DOB - [birth year] ? ?Chief Complaint  ?Patient presents with  ? Diabetes  ? Hypertension  ?    ? ?Subjective:  ? ?Jeffrey Mullen is a 71 y.o. male here today for a follow up visit after having a Stroke October 2022.  Released from [facility] Jan 15, 2022.  Currently he is taking: ?Lisinopril/hct 20/25; amlodipine 10, atorvatatin 40, buspar ?This is not his discharge medication list.  He was supposed to STOP lisinopril/HCT and atorvastatin.  He is still supposed to be taking amlodipine and buspar.  In addition, he is supposed to be taking carvedilol and keppra and using collagenase on his arm-he never picked any of these up.   ? ?He is not aware of having had any seizure activity.  He has missed multiple f/up appts including one here with his PCP Jeffrey Mullen.   ? ?He is requiring help at home getting in and out of the shower.  He still has not gained his strength back of his upper or lower extremities.  Sometimes he feels as though he doesn't have enough strength to get to his rolling walker.  ? ?IV infiltration site on L arm is healing slowly.   ? ?He is currently diet controlled with diabetes.   ? ? ? ?No problems updated. ? ?ALLERGIES: ?No Known Allergies ? ?PAST MEDICAL HISTORY: ?Past Medical History:  ?Diagnosis Date  ? Alcohol abuse   ? Allergy   ? Anemia   ? Anxiety   ? Arthritis   ? Blind left eye   ? Cataract, right eye   ? 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 Depression   ? Glaucoma, bilateral   ? Hepatic cirrhosis ([place])   ? Hepatitis   ? HEP C treated in 2016  ? HTN (hypertension)   ? Seizures ([place])   ? 3 years ago  ? ? ?MEDICATIONS AT HOME: ?Prior to Admission medications   ?Medication Sig Start Date End Date Taking? Authorizing Provider  ?acetaminophen (TYLENOL) 325 MG tablet Take 2 tablets (650 mg total) by mouth every 4 (four) hours as  needed for fever or mild pain. ?Patient not taking: Reported on 04/04/2022 12/14/21   Jeffrey Parr, NP  ?amLODipine (NORVASC) 10 MG tablet Take 1 tablet (10 mg total) by mouth daily. 04/04/22   Jeffrey Donovan, PA-C  ?arformoterol (BROVANA) 15 MCG/2ML NEBU Take 2 mLs (15 mcg total) by nebulization 2 (two) times daily. ?Patient not taking: Reported on 04/04/2022 12/14/21   Jeffrey Parr, NP  ?ascorbic acid (VITAMIN C) 500 MG tablet Take 500 mg by mouth daily. ?Patient not taking: Reported on 04/04/2022    [provider]  ?aspirin 81 MG chewable tablet Chew 1 tablet (81 mg total) by mouth daily. ?Patient not taking: Reported on 04/04/2022 12/15/21   Jeffrey Parr, NP  ?atropine 1 % ophthalmic solution Place 1 drop into the left eye 2 (two) times daily. ?Patient not taking: Reported on 04/04/2022 03/25/19   Jeffrey Stain, MD  ?busPIRone (BUSPAR) 15 MG tablet Take 1 tablet (15 mg total) by mouth 3 (three) times daily. 04/04/22   Jeffrey Donovan, PA-C  ?carbamide peroxide (DEBROX) 6.5 % OTIC solution 3 drops every 8 (eight) hours as needed (Wax build-up). ?Patient not taking: Reported on 04/04/2022    [provider]  ?carvedilol (COREG) 12.5 MG tablet Take 1 tablet (12.5 mg total) by mouth 2 (two) times daily with a meal. 04/04/22   Jeffrey Donovan, PA-C  ?Cholecalciferol (VITAMIN D) 50 MCG (2000 UT) CAPS Take 2,000 Units by mouth daily. ?Patient not taking: Reported on 04/04/2022    [provider]  ?collagenase (SANTYL) 250 UNIT/GM ointment Apply topically 2 (two) times daily. 04/04/22   Jeffrey Donovan, PA-C  ?docusate sodium (COLACE) 100 MG capsule Take 1 capsule (100 mg total) by mouth daily as needed for mild constipation. ?Patient not taking: Reported on 04/04/2022 12/14/21   Jeffrey Parr, NP  ?dorzolamide-timolol (COSOPT) 22.3-6.8 MG/ML ophthalmic solution Place 1 drop into the right eye 2 (two) times daily. ?Patient not taking: Reported on 04/04/2022    [provider]  ?fiber (NUTRISOURCE FIBER) PACK packet Take 1 packet by mouth 2 (two) times daily. ?Patient not taking: Reported on 01/18/2022 12/14/21   Jeffrey Parr, NP  ?folic acid (FOLVITE) 1 MG tablet Take 1 tablet (1 mg total) by mouth daily. ?Patient not taking: Reported on 04/04/2022 12/14/21   Jeffrey Parr, NP  ?guaiFENesin (MUCINEX) 600 MG 12 hr tablet Take 2 tablets (1,200 mg total) by mouth 2 (two) times daily. ?Patient not taking: Reported on 04/04/2022 12/14/21   Jeffrey Parr, NP  ?ibuprofen (ADVIL) 800 MG tablet Take 1 tablet (800 mg total) by mouth every 8 (eight) hours as needed. ?Patient not taking: Reported on 04/04/2022 03/21/22   Jeffrey Pounds, NP  ?ipratropium-albuterol (DUONEB) 0.5-2.5 (3) MG/3ML SOLN Take 3 mLs by nebulization every 4 (four) hours as needed. ?Patient not taking: Reported on 04/04/2022 12/14/21   Jeffrey Parr, NP  ?lacosamide (VIMPAT) 200 MG TABS tablet Take 1 tablet (200 mg total) by mouth 2 (two) times daily. ?Patient not taking: Reported on 04/04/2022 12/14/21   Jeffrey Parr, NP  ?latanoprost (XALATAN) 0.005 % ophthalmic solution Place 1 drop into the right eye at bedtime. ?Patient not taking: Reported on 04/04/2022    [provider]  ?levETIRAcetam (KEPPRA) 100 MG/ML solution Take 5 mLs (500 mg total) by mouth 2 (two) times daily. 04/04/22   Jeffrey Donovan, PA-C  ?loperamide (IMODIUM) 2 MG capsule Take 2 capsules (4 mg total) by mouth as needed for diarrhea or loose stools. ?Patient not taking: Reported on 04/04/2022 12/14/21   Jeffrey Parr, NP  ?loratadine (CLARITIN) 10 MG tablet Take 10 mg by mouth daily. ?Patient not taking: Reported on 04/04/2022    [provider]  ?Multiple Vitamin (MULTIVITAMIN WITH MINERALS) TABS tablet Take 1 tablet by mouth daily. ?Patient not taking: Reported on 04/04/2022 12/14/21   Jeffrey Parr, NP  ?nicotine (NICODERM CQ - DOSED IN MG/24 HOURS) 14 mg/24hr patch Place 1 patch (14 mg total) onto the skin  daily. ?Patient not taking: Reported on 01/18/2022 12/14/21   Jeffrey Parr, NP  ?pantoprazole (PROTONIX) 40 MG tablet Take 1 tablet (40 mg total) by mouth daily. 04/04/22   Jeffrey Donovan, PA-C  ?polyethylene glycol (MIRALAX / GLYCOLAX) 17 g packet Place 17 g into feeding tube daily as needed for moderate constipation. ?Patient not taking: Reported on 04/04/2022 12/14/21   Jeffrey Parr, NP  ?PROTEIN PO Take 30 mLs by mouth 2 (two) times daily. Supplement ?Patient not taking: Reported on 04/04/2022    [provider]  ?revefenacin (YUPELRI) 175 MCG/3ML nebulizer solution Take 3 mLs (175 mcg total) by nebulization daily. ?Patient not taking:  Reported on 01/18/2022 12/15/21   Jeffrey Parr, NP  ?saccharomyces boulardii (FLORASTOR) 250 MG capsule Take 1 capsule (250 mg total) by mouth 2 (two) times daily. ?Patient not taking: Reported on 04/04/2022 12/14/21   Jeffrey Parr, NP  ?tacrolimus (PROTOPIC) 0.1 % ointment APPLY TO FACE NIGHTLY ?Patient not taking: Reported on 04/04/2022 10/09/21   Lavonna Monarch, MD  ?thiamine 100 MG tablet Take 1 tablet (100 mg total) by mouth daily. ?Patient not taking: Reported on 04/04/2022 12/14/21   Jeffrey Parr, NP  ?Tiotropium Bromide Monohydrate (SPIRIVA RESPIMAT) 2.5 MCG/ACT AERS Inhale 2 puffs into the lungs daily. ?Patient not taking: Reported on 04/04/2022    [provider]  ?traMADol (ULTRAM) 50 MG tablet Take 50 mg by mouth 2 (two) times daily. ?Patient not taking: Reported on 04/04/2022    [provider]  ?triamcinolone (KENALOG) 0.025 % ointment APPLY ONE APPLICATION TOPICALLY TWICE A DAY ?Patient not taking: Reported on 04/04/2022 09/29/20   Jeffrey Pounds, NP  ? ? ?ROS: ?Neg HEENT ?Neg resp ?Neg cardiac ?Neg GI ?Neg GU ?Neg MS ?Neg psych ?Neg neuro ? ?Objective:  ? ?Vitals:  ? 04/04/22 1044  ?BP: 137/71  ?Pulse: 79  ?Resp: 16  ?SpO2: 96%  ?Weight: 153 lb 12.8 oz (69.8 kg)  ? ?Exam ?General appearance : Awake, alert, not in any  distress. Speech Clear. Not toxic looking;  appears frail and in poor health ?HEENT: Atraumatic and Normocephalic ?Neck: Supple, no JVD. No cervical lymphadenopathy.  ?Chest: Good air entry bilaterally, CTAB.  No rales

## 2022-04-04 NOTE — Telephone Encounter (Signed)
Met with the patient when he was in the clinic today.He explained that he lives alone and a friend comes to check on his multiple times a week. He needs assistance with bathing and ADLs and is in agreement to sending a referral for PCS.  That referral was then faxed to [facility].  ? ?He is interested in home PT to assist with developing a home exercise program.  He has no preference of home health agencies. I told him that I will contact multiple agencies but there is no guarantee that an agency will be able to accept the referral.  He said he understood and if he is not able to get home health therapy, he would like to be referred for outpatient therapy.  ? ?He stated that he contacts his insurance [employer] and Medicaid to arrange rides to medical appointments.  ?He receives about $900/month in retirement and $83/month food stamps.  ? ?He also explained that when he was discharged from the rehab facility the home health agency that he was referred to did not feel that he should be home and he needed another level of care and at that time an APS referral was made.  He said that his APS caseworker is Festus Holts # [phone].  ?

## 2022-04-05 ENCOUNTER — Telehealth: Payer: Self-pay | Admitting: Nurse Practitioner

## 2022-04-05 ENCOUNTER — Telehealth: Payer: Self-pay

## 2022-04-05 LAB — COMPREHENSIVE METABOLIC PANEL
ALT: 10 IU/L (ref 0–44)
AST: 16 IU/L (ref 0–40)
Albumin/Globulin Ratio: 1.3 (ref 1.2–2.2)
Albumin: 4.6 g/dL (ref 3.8–4.8)
Alkaline Phosphatase: 120 IU/L (ref 44–121)
BUN/Creatinine Ratio: 10 (ref 10–24)
BUN: 10 mg/dL (ref 8–27)
Bilirubin Total: 0.4 mg/dL (ref 0.0–1.2)
CO2: 27 mmol/L (ref 20–29)
Calcium: 10.6 mg/dL — ABNORMAL HIGH (ref 8.6–10.2)
Chloride: 101 mmol/L (ref 96–106)
Creatinine, Ser: 1.04 mg/dL (ref 0.76–1.27)
Globulin, Total: 3.5 g/dL (ref 1.5–4.5)
Glucose: 127 mg/dL — ABNORMAL HIGH (ref 70–99)
Potassium: 4.4 mmol/L (ref 3.5–5.2)
Sodium: 143 mmol/L (ref 134–144)
Total Protein: 8.1 g/dL (ref 6.0–8.5)
eGFR: 78 mL/min/{1.73_m2} (ref >59)

## 2022-04-05 LAB — CBC WITH DIFFERENTIAL/PLATELET
Basophils Absolute: 0 10*3/uL (ref 0.0–0.2)
Basos: 0 %
EOS (ABSOLUTE): 0 10*3/uL (ref 0.0–0.4)
Eos: 1 %
Hematocrit: 43.5 % (ref 37.5–51.0)
Hemoglobin: 13.5 g/dL (ref 13.0–17.7)
Immature Grans (Abs): 0 10*3/uL (ref 0.0–0.1)
Immature Granulocytes: 0 %
Lymphocytes Absolute: 2 10*3/uL (ref 0.7–3.1)
Lymphs: 22 %
MCH: 24.3 pg — ABNORMAL LOW (ref 26.6–33.0)
MCHC: 31 g/dL — ABNORMAL LOW (ref 31.5–35.7)
MCV: 78 fL — ABNORMAL LOW (ref 79–97)
Monocytes Absolute: 0.7 10*3/uL (ref 0.1–0.9)
Monocytes: 8 %
Neutrophils Absolute: 6 10*3/uL (ref 1.4–7.0)
Neutrophils: 69 %
Platelets: 312 10*3/uL (ref 150–450)
RBC: 5.56 x10E6/uL (ref 4.14–5.80)
RDW: 16 % — ABNORMAL HIGH (ref 11.6–15.4)
WBC: 8.7 10*3/uL (ref 3.4–10.8)

## 2022-04-05 LAB — LIPID PANEL
Chol/HDL Ratio: 2.5 ratio (ref 0.0–5.0)
Cholesterol, Total: 149 mg/dL (ref 100–199)
HDL: 60 mg/dL (ref >39)
LDL Chol Calc (NIH): 69 mg/dL (ref 0–99)
Triglycerides: 111 mg/dL (ref 0–149)
VLDL Cholesterol Cal: 20 mg/dL (ref 5–40)

## 2022-04-05 NOTE — Telephone Encounter (Signed)
Call placed to [facility], [place], who said they are not able to accept the home health  referral. ? ?Call placed to [facility], spoke to [facility] who requested that the referral be faxed for review. Referral faxed to # [phone] ? ?Call placed to [facility], receptionist requested the referral be faxed for review. They do accept [facility] Medicare. Referral then faxed to # [phone]. ?

## 2022-04-05 NOTE — Telephone Encounter (Signed)
Called [phone] left VM to call back ?

## 2022-04-05 NOTE — Telephone Encounter (Signed)
Marsh Dolly calling from [place] is following up on referral for home Health. Lakita plans on closing the case with the pt. And would like to ensure that the pt has [place] before the case is closed. ?CB- [phone] ?

## 2022-04-05 NOTE — Telephone Encounter (Signed)
Call returned from [place] , made ware of referral in place ?

## 2022-04-09 ENCOUNTER — Ambulatory Visit: Payer: Self-pay | Admitting: *Deleted

## 2022-04-09 ENCOUNTER — Telehealth: Payer: Self-pay | Admitting: Pharmacist

## 2022-04-09 NOTE — Telephone Encounter (Signed)
I had a nurse triage nurse sent to me but I cannot access that message. Routing information as a telephone note to PA McClung's nurse for clarification.  ? ?Note ?  ?Reason for Disposition ? [1] Caller has URGENT medicine question about med that PCP or specialist prescribed AND [2] triager unable to answer question ? ?Answer Assessment - Initial Assessment Questions ?1. NAME of MEDICATION: "What medicine are you calling about?" ?    Collagenase 250 units/GM ointment ?2. QUESTION: "What is your question?" (e.g., double dose of medicine, side effect) ?    Which wound do I put it on.   The instructions don't say.    ?I have 2 wounds.  One on my heel and one on my left hand. ?3. PRESCRIBING HCP: "Who prescribed it?" Reason: if prescribed by specialist, call should be referred to that group. ?    Freeman Caldron, PA ?4. SYMPTOMS: "Do you have any symptoms?" ?    N/A ?5. SEVERITY: If symptoms are present, ask "Are they mild, moderate or severe?" ?    N/A ?6. PREGNANCY:  "Is there any chance that you are pregnant?" "When was your last menstrual period?" ?    N/A ? ?Protocols used: Medication Question Call-A-AH  ?  ? ?
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
## 2022-04-09 NOTE — Telephone Encounter (Signed)
Reason for Disposition ? [1] Caller has URGENT medicine question about med that PCP or specialist prescribed AND [2] triager unable to answer question ? ?Answer Assessment - Initial Assessment Questions ?1. NAME of MEDICATION: "What medicine are you calling about?" ?    Collagenase 250 units/GM ointment ?2. QUESTION: "What is your question?" (e.g., double dose of medicine, side effect) ?    Which wound do I put it on.   The instructions don't say.    ?I have 2 wounds.  One on my heel and one on my left hand. ?3. PRESCRIBING HCP: "Who prescribed it?" Reason: if prescribed by specialist, call should be referred to that group. ?    Freeman Caldron, PA ?4. SYMPTOMS: "Do you have any symptoms?" ?    N/A ?5. SEVERITY: If symptoms are present, ask "Are they mild, moderate or severe?" ?    N/A ?6. PREGNANCY:  "Is there any chance that you are pregnant?" "When was your last menstrual period?" ?    N/A ? ?Protocols used: Medication Question Call-A-AH ? ?

## 2022-04-09 NOTE — Telephone Encounter (Signed)
I have not seen the wound to determine if he needs santyl and how long the PA wanted him to use it. Please forward to her for recommendations on Wednesday.  ?

## 2022-04-10 NOTE — Telephone Encounter (Signed)
I called [facility], spoke to [place] who said that the referral is still pending review. ? ?Call placed to [facility], spoke to [facility] and they declined referral due to staffing.  ? ?Referral faxed to [facility] for review # [phone]. ? ?I called Angie Coley/ [place] inquiring if they are in network with [facility] Medicare and if they have staffing to accept the referral. Message left with call back requested. The referral was also faxed for review # [phone].  ? ?Call placed to [facility] , spoke to [facility] who requested referral be faxed for review.  Referral then faxed to # [phone] ? ?

## 2022-04-10 NOTE — Telephone Encounter (Signed)
Spoke to patient.  ?Aware that we awaiting on the clarification from provider. Will call back when notified.  ? ?Patient verbalized understanding.  ?

## 2022-04-11 NOTE — Telephone Encounter (Signed)
Call received from Tammy/ [facility] stating that they are not able to accept the referral ?

## 2022-04-11 NOTE — Telephone Encounter (Signed)
Patient advised he could use Rx for would on left arm and heal, per Provider Thereasa Solo, PA-C ? ?Advised to follow up if he did not see improvement ( black in color,tissue too soft, oozing, odor or weeping) especially since he has DM medical condition.  ?Advise not to use on good tissue.  ? ?Patient verbalized understanding.  ?

## 2022-04-11 NOTE — Telephone Encounter (Signed)
Message received from [facility] stating that they have accepted the referral and will be contacting the patient within 48 hours.  ? ?Call placed to [facility], spoke to [place] and cancelled the referral. ? ?Call placed to [facility], spoke to [facility] and cancelled the referral. ? ?Attempted to contact the patient to inform him that [facility] will be contacting him within the next 48 hours to set up an initial assessment.  Message left with call back requested to this CM.  ?

## 2022-04-12 ENCOUNTER — Telehealth: Payer: Self-pay | Admitting: Nurse Practitioner

## 2022-04-12 DIAGNOSIS — E1169 Type 2 diabetes mellitus with other specified complication: Secondary | ICD-10-CM | POA: Diagnosis not present

## 2022-04-12 NOTE — Telephone Encounter (Signed)
Home Health Verbal Orders - Caller/Agency: [facility] ?Callback Number: [phone] ?Requesting Skilled Nursing  ?Frequency: 1 wk 4 ? ?

## 2022-04-12 NOTE — Telephone Encounter (Signed)
Attempted again to contact the patient to inform him that [facility] will be contacting him within the next 48 hours to set up an initial assessment.  Message left with call back requested to this CM.  ?

## 2022-04-13 ENCOUNTER — Encounter: Payer: Self-pay | Admitting: Nurse Practitioner

## 2022-04-13 ENCOUNTER — Ambulatory Visit: Payer: Medicare Other | Attending: Nurse Practitioner | Admitting: Nurse Practitioner

## 2022-04-13 VITALS — BP 113/68 | HR 76 | Wt 155.8 lb

## 2022-04-13 DIAGNOSIS — G8929 Other chronic pain: Secondary | ICD-10-CM | POA: Diagnosis not present

## 2022-04-13 DIAGNOSIS — Z8673 Personal history of transient ischemic attack (TIA), and cerebral infarction without residual deficits: Secondary | ICD-10-CM | POA: Diagnosis not present

## 2022-04-13 DIAGNOSIS — M79672 Pain in left foot: Secondary | ICD-10-CM | POA: Diagnosis not present

## 2022-04-13 DIAGNOSIS — R7303 Prediabetes: Secondary | ICD-10-CM

## 2022-04-13 MED ORDER — IBUPROFEN 800 MG PO TABS: 800.0000 mg | ORAL_TABLET | Freq: Three times a day (TID) | ORAL | 0 refills | Status: DC | PRN

## 2022-04-13 NOTE — Patient Instructions (Signed)
Garvin Fila, MD. Schedule an appointment as soon as possible for a visit in 1 month(s).   ?Specialties: Neurology, Radiology ?Why: stroke clinic ?Contact information: ?[address] ?[address] ?[place] [address] ?[phone]  ? ? ?PODIATRY ?[place]: ?[address], [place] [address]. Main: [phone]. ?

## 2022-04-13 NOTE — Telephone Encounter (Signed)
done

## 2022-04-13 NOTE — Progress Notes (Signed)
? ?Assessment & Plan:  ?[name] was seen today for wound check. ? ?Diagnoses and all orders for this visit: ? ?Chronic heel pain, left ?-     Ambulatory referral to Podiatry ? ?History of recent stroke ?-     Ambulatory referral to Neurology ?Refer to DR. 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 ? ?Prediabetes ?-     Ambulatory referral to Podiatry ? ? ? ?Patient has been counseled on age-appropriate routine health concerns for screening and prevention. These are reviewed and up-to-date. Referrals have been placed accordingly. Immunizations are up-to-date or declined.    ?Subjective:  ? ?Chief Complaint  ?Patient presents with  ? Wound Check  ? ?HPI ?Jeffrey Mullen 71 y.o. male presents to office today with complaints of left heel pain made worse with standing or weight bearing. Diabetes is well controlled at this time. He has a history of pressure ulcer of the left heel and was previously receiving wound care in a skilled facility in December.  He has since been discharged. Today the wound appears healed  however as he continues having pain we will have him follow up with podiatry for this.  ?Lab Results  ?Component Value Date  ? HGBA1C 6.0 04/04/2022  ?  ? ? ? ?Review of Systems  ?Constitutional:  Negative for fever, malaise/fatigue and weight loss.  ?HENT: Negative.  Negative for nosebleeds.   ?Eyes:  Positive for blurred vision (cataracts). Negative for double vision and photophobia.  ?Respiratory: Negative.  Negative for cough and shortness of breath.   ?Cardiovascular: Negative.  Negative for chest pain, palpitations and leg swelling.  ?Gastrointestinal: Negative.  Negative for heartburn, nausea and vomiting.  ?Musculoskeletal:  Positive for back pain. Negative for myalgias.  ?Skin:   ?     SEE HPI  ?Neurological:  Positive for weakness (using RW). Negative for dizziness, focal weakness, seizures and headaches.  ?Psychiatric/Behavioral: Negative.  Negative for suicidal ideas.   ? ?Past Medical History:   ?Diagnosis Date  ? Alcohol abuse   ? Allergy   ? Anemia   ? Anxiety   ? Arthritis   ? Blind left eye   ? Cataract, right eye   ? 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 Depression   ? Glaucoma, bilateral   ? Hepatic cirrhosis ([place])   ? Hepatitis   ? HEP C treated in 2016  ? HTN (hypertension)   ? Seizures ([place])   ? 3 years ago  ? ? ?Past Surgical History:  ?Procedure Laterality Date  ? CATARACT EXTRACTION Left   ? GLAUCOMA SURGERY    ? Patient reports 10 or eleven surgeries for Glaucoma  ? ? ?Family History  ?Problem Relation Age of Onset  ? Hearing loss Other   ? Kidney disease Neg Hx   ? Colon cancer Neg Hx   ? Esophageal cancer Neg Hx   ? Rectal cancer Neg Hx   ? Stomach cancer Neg Hx   ? ? ?Social History Reviewed with no changes to be made today.  ? ?Outpatient Medications Prior to Visit  ?Medication Sig Dispense Refill  ? acetaminophen (TYLENOL) 325 MG tablet Take 2 tablets (650 mg total) by mouth every 4 (four) hours as needed for fever or mild pain. (Patient not taking: Reported on 04/04/2022)    ? amLODipine (NORVASC) 10 MG tablet Take 1 tablet (10 mg total) by mouth daily. 90 tablet 1  ? ascorbic acid (VITAMIN C) 500 MG tablet Take 500 mg by mouth  daily. (Patient not taking: Reported on 04/04/2022)    ? aspirin 81 MG chewable tablet Chew 1 tablet (81 mg total) by mouth daily. (Patient not taking: Reported on 04/04/2022) 30 tablet 3  ? atropine 1 % ophthalmic solution Place 1 drop into the left eye 2 (two) times daily. (Patient not taking: Reported on 04/04/2022) 15 mL 2  ? busPIRone (BUSPAR) 15 MG tablet Take 1 tablet (15 mg total) by mouth 3 (three) times daily. 90 tablet 3  ? carvedilol (COREG) 12.5 MG tablet Take 1 tablet (12.5 mg total) by mouth 2 (two) times daily with a meal. 60 tablet 3  ? Cholecalciferol (VITAMIN D) 50 MCG (2000 UT) CAPS Take 2,000 Units by mouth daily. (Patient not taking: Reported on 04/04/2022)    ? dorzolamide-timolol (COSOPT) 22.3-6.8 MG/ML ophthalmic solution Place 1 drop into the  right eye 2 (two) times daily. (Patient not taking: Reported on 04/04/2022)    ? fiber (NUTRISOURCE FIBER) PACK packet Take 1 packet by mouth 2 (two) times daily. (Patient not taking: Reported on 01/18/2022)    ? folic acid (FOLVITE) 1 MG tablet Take 1 tablet (1 mg total) by mouth daily. (Patient not taking: Reported on 04/04/2022)    ? ipratropium-albuterol (DUONEB) 0.5-2.5 (3) MG/3ML SOLN Take 3 mLs by nebulization every 4 (four) hours as needed. (Patient not taking: Reported on 04/04/2022) 360 mL   ? lacosamide (VIMPAT) 200 MG TABS tablet Take 1 tablet (200 mg total) by mouth 2 (two) times daily. (Patient not taking: Reported on 04/04/2022) 60 tablet   ? latanoprost (XALATAN) 0.005 % ophthalmic solution Place 1 drop into the right eye at bedtime. (Patient not taking: Reported on 04/04/2022)    ? levETIRAcetam (KEPPRA) 100 MG/ML solution Take 5 mLs (500 mg total) by mouth 2 (two) times daily. 473 mL 12  ? Multiple Vitamin (MULTIVITAMIN WITH MINERALS) TABS tablet Take 1 tablet by mouth daily. (Patient not taking: Reported on 04/04/2022)    ? nicotine (NICODERM CQ - DOSED IN MG/24 HOURS) 14 mg/24hr patch Place 1 patch (14 mg total) onto the skin daily. (Patient not taking: Reported on 01/18/2022) 28 patch 0  ? pantoprazole (PROTONIX) 40 MG tablet Take 1 tablet (40 mg total) by mouth daily. 90 tablet 0  ? PROTEIN PO Take 30 mLs by mouth 2 (two) times daily. Supplement (Patient not taking: Reported on 04/04/2022)    ? tacrolimus (PROTOPIC) 0.1 % ointment APPLY TO FACE NIGHTLY (Patient not taking: Reported on 04/04/2022) 60 g 1  ? thiamine 100 MG tablet Take 1 tablet (100 mg total) by mouth daily. (Patient not taking: Reported on 04/04/2022)    ? Tiotropium Bromide Monohydrate (SPIRIVA RESPIMAT) 2.5 MCG/ACT AERS Inhale 2 puffs into the lungs daily. (Patient not taking: Reported on 04/04/2022)    ? traMADol (ULTRAM) 50 MG tablet Take 50 mg by mouth 2 (two) times daily. (Patient not taking: Reported on 04/04/2022)    ? arformoterol  (BROVANA) 15 MCG/2ML NEBU Take 2 mLs (15 mcg total) by nebulization 2 (two) times daily. (Patient not taking: Reported on 04/04/2022) 120 mL   ? carbamide peroxide (DEBROX) 6.5 % OTIC solution 3 drops every 8 (eight) hours as needed (Wax build-up). (Patient not taking: Reported on 04/04/2022)    ? collagenase (SANTYL) 250 UNIT/GM ointment Apply topically 2 (two) times daily. 15 g 2  ? docusate sodium (COLACE) 100 MG capsule Take 1 capsule (100 mg total) by mouth daily as needed for mild constipation. (Patient not taking: Reported on  04/04/2022) 10 capsule 0  ? guaiFENesin (MUCINEX) 600 MG 12 hr tablet Take 2 tablets (1,200 mg total) by mouth 2 (two) times daily. (Patient not taking: Reported on 04/04/2022)    ? ibuprofen (ADVIL) 800 MG tablet Take 1 tablet (800 mg total) by mouth every 8 (eight) hours as needed. (Patient not taking: Reported on 04/04/2022) 60 tablet 0  ? loperamide (IMODIUM) 2 MG capsule Take 2 capsules (4 mg total) by mouth as needed for diarrhea or loose stools. (Patient not taking: Reported on 04/04/2022) 30 capsule 0  ? loratadine (CLARITIN) 10 MG tablet Take 10 mg by mouth daily. (Patient not taking: Reported on 04/04/2022)    ? polyethylene glycol (MIRALAX / GLYCOLAX) 17 g packet Place 17 g into feeding tube daily as needed for moderate constipation. (Patient not taking: Reported on 04/04/2022) 14 each 0  ? revefenacin (YUPELRI) 175 MCG/3ML nebulizer solution Take 3 mLs (175 mcg total) by nebulization daily. (Patient not taking: Reported on 01/18/2022) 90 mL   ? saccharomyces boulardii (FLORASTOR) 250 MG capsule Take 1 capsule (250 mg total) by mouth 2 (two) times daily. (Patient not taking: Reported on 04/04/2022)    ? triamcinolone (KENALOG) 0.025 % ointment APPLY ONE APPLICATION TOPICALLY TWICE A DAY (Patient not taking: Reported on 04/04/2022) 30 g 0  ? ?No facility-administered medications prior to visit.  ? ? ?No Known Allergies ? ?   ?Objective:  ?  ?BP 113/68   Pulse 76   Wt 155 lb 12.8 oz  (70.7 kg)   SpO2 98%   BMI 22.35 kg/m?  ?Wt Readings from Last 3 Encounters:  ?04/13/22 155 lb 12.8 oz (70.7 kg)  ?04/04/22 153 lb 12.8 oz (69.8 kg)  ?12/09/21 266 lb 15.6 oz (121.1 kg)  ? ? ?Physical Exam ?Vitals

## 2022-04-13 NOTE — Telephone Encounter (Signed)
Verbal orders approved/given for [facility] ?

## 2022-04-14 DIAGNOSIS — E1169 Type 2 diabetes mellitus with other specified complication: Secondary | ICD-10-CM | POA: Diagnosis not present

## 2022-04-20 DIAGNOSIS — E1169 Type 2 diabetes mellitus with other specified complication: Secondary | ICD-10-CM | POA: Diagnosis not present

## 2022-04-21 DIAGNOSIS — E1169 Type 2 diabetes mellitus with other specified complication: Secondary | ICD-10-CM | POA: Diagnosis not present

## 2022-04-23 DIAGNOSIS — E1169 Type 2 diabetes mellitus with other specified complication: Secondary | ICD-10-CM | POA: Diagnosis not present

## 2022-04-25 DIAGNOSIS — E1169 Type 2 diabetes mellitus with other specified complication: Secondary | ICD-10-CM | POA: Diagnosis not present

## 2022-05-01 ENCOUNTER — Ambulatory Visit (INDEPENDENT_AMBULATORY_CARE_PROVIDER_SITE_OTHER): Payer: Self-pay | Admitting: Podiatry

## 2022-05-01 DIAGNOSIS — Z91199 Patient's noncompliance with other medical treatment and regimen due to unspecified reason: Secondary | ICD-10-CM

## 2022-05-01 NOTE — Progress Notes (Signed)
Patient was no-show for appointment today 

## 2022-05-04 DIAGNOSIS — E1165 Type 2 diabetes mellitus with hyperglycemia: Secondary | ICD-10-CM | POA: Diagnosis not present

## 2022-05-04 DIAGNOSIS — R569 Unspecified convulsions: Secondary | ICD-10-CM | POA: Diagnosis not present

## 2022-05-04 DIAGNOSIS — Z7982 Long term (current) use of aspirin: Secondary | ICD-10-CM | POA: Diagnosis not present

## 2022-05-04 DIAGNOSIS — I10 Essential (primary) hypertension: Secondary | ICD-10-CM | POA: Diagnosis not present

## 2022-05-04 DIAGNOSIS — Z8673 Personal history of transient ischemic attack (TIA), and cerebral infarction without residual deficits: Secondary | ICD-10-CM | POA: Diagnosis not present

## 2022-05-04 DIAGNOSIS — E1136 Type 2 diabetes mellitus with diabetic cataract: Secondary | ICD-10-CM | POA: Diagnosis not present

## 2022-05-04 DIAGNOSIS — H409 Unspecified glaucoma: Secondary | ICD-10-CM | POA: Diagnosis not present

## 2022-05-04 DIAGNOSIS — L89620 Pressure ulcer of left heel, unstageable: Secondary | ICD-10-CM | POA: Diagnosis not present

## 2022-05-04 DIAGNOSIS — Z791 Long term (current) use of non-steroidal anti-inflammatories (NSAID): Secondary | ICD-10-CM | POA: Diagnosis not present

## 2022-05-04 DIAGNOSIS — M1990 Unspecified osteoarthritis, unspecified site: Secondary | ICD-10-CM | POA: Diagnosis not present

## 2022-05-04 DIAGNOSIS — H5462 Unqualified visual loss, left eye, normal vision right eye: Secondary | ICD-10-CM | POA: Diagnosis not present

## 2022-05-04 DIAGNOSIS — D649 Anemia, unspecified: Secondary | ICD-10-CM | POA: Diagnosis not present

## 2022-05-04 DIAGNOSIS — E785 Hyperlipidemia, unspecified: Secondary | ICD-10-CM | POA: Diagnosis not present

## 2022-05-04 DIAGNOSIS — J309 Allergic rhinitis, unspecified: Secondary | ICD-10-CM | POA: Diagnosis not present

## 2022-05-04 DIAGNOSIS — F32A Depression, unspecified: Secondary | ICD-10-CM | POA: Diagnosis not present

## 2022-05-04 DIAGNOSIS — Z9181 History of falling: Secondary | ICD-10-CM | POA: Diagnosis not present

## 2022-05-11 DIAGNOSIS — H5462 Unqualified visual loss, left eye, normal vision right eye: Secondary | ICD-10-CM | POA: Diagnosis not present

## 2022-05-11 DIAGNOSIS — H409 Unspecified glaucoma: Secondary | ICD-10-CM | POA: Diagnosis not present

## 2022-05-11 DIAGNOSIS — R569 Unspecified convulsions: Secondary | ICD-10-CM | POA: Diagnosis not present

## 2022-05-11 DIAGNOSIS — F32A Depression, unspecified: Secondary | ICD-10-CM | POA: Diagnosis not present

## 2022-05-11 DIAGNOSIS — E1165 Type 2 diabetes mellitus with hyperglycemia: Secondary | ICD-10-CM | POA: Diagnosis not present

## 2022-05-11 DIAGNOSIS — M1990 Unspecified osteoarthritis, unspecified site: Secondary | ICD-10-CM | POA: Diagnosis not present

## 2022-05-11 DIAGNOSIS — Z9181 History of falling: Secondary | ICD-10-CM | POA: Diagnosis not present

## 2022-05-11 DIAGNOSIS — Z8673 Personal history of transient ischemic attack (TIA), and cerebral infarction without residual deficits: Secondary | ICD-10-CM | POA: Diagnosis not present

## 2022-05-11 DIAGNOSIS — D649 Anemia, unspecified: Secondary | ICD-10-CM | POA: Diagnosis not present

## 2022-05-11 DIAGNOSIS — E1136 Type 2 diabetes mellitus with diabetic cataract: Secondary | ICD-10-CM | POA: Diagnosis not present

## 2022-05-11 DIAGNOSIS — Z7982 Long term (current) use of aspirin: Secondary | ICD-10-CM | POA: Diagnosis not present

## 2022-05-11 DIAGNOSIS — I10 Essential (primary) hypertension: Secondary | ICD-10-CM | POA: Diagnosis not present

## 2022-05-11 DIAGNOSIS — L89620 Pressure ulcer of left heel, unstageable: Secondary | ICD-10-CM | POA: Diagnosis not present

## 2022-05-11 DIAGNOSIS — Z791 Long term (current) use of non-steroidal anti-inflammatories (NSAID): Secondary | ICD-10-CM | POA: Diagnosis not present

## 2022-05-11 DIAGNOSIS — J309 Allergic rhinitis, unspecified: Secondary | ICD-10-CM | POA: Diagnosis not present

## 2022-05-11 DIAGNOSIS — E785 Hyperlipidemia, unspecified: Secondary | ICD-10-CM | POA: Diagnosis not present

## 2022-05-15 ENCOUNTER — Telehealth: Payer: Self-pay

## 2022-05-15 NOTE — Telephone Encounter (Signed)
I called Jeffrey Mullen, spoke to [place] who said that the patient's PCS assessment was done on 04/20/2022 and they are now just waiting for him to choose an agency to provide the services.

## 2022-05-17 ENCOUNTER — Ambulatory Visit: Payer: Medicare Other | Admitting: Podiatry

## 2022-05-24 DIAGNOSIS — E785 Hyperlipidemia, unspecified: Secondary | ICD-10-CM | POA: Diagnosis not present

## 2022-05-24 DIAGNOSIS — E1136 Type 2 diabetes mellitus with diabetic cataract: Secondary | ICD-10-CM | POA: Diagnosis not present

## 2022-05-24 DIAGNOSIS — F32A Depression, unspecified: Secondary | ICD-10-CM | POA: Diagnosis not present

## 2022-05-24 DIAGNOSIS — D649 Anemia, unspecified: Secondary | ICD-10-CM | POA: Diagnosis not present

## 2022-05-24 DIAGNOSIS — E1165 Type 2 diabetes mellitus with hyperglycemia: Secondary | ICD-10-CM | POA: Diagnosis not present

## 2022-05-24 DIAGNOSIS — J309 Allergic rhinitis, unspecified: Secondary | ICD-10-CM | POA: Diagnosis not present

## 2022-05-24 DIAGNOSIS — R569 Unspecified convulsions: Secondary | ICD-10-CM | POA: Diagnosis not present

## 2022-05-24 DIAGNOSIS — Z8673 Personal history of transient ischemic attack (TIA), and cerebral infarction without residual deficits: Secondary | ICD-10-CM | POA: Diagnosis not present

## 2022-05-24 DIAGNOSIS — Z791 Long term (current) use of non-steroidal anti-inflammatories (NSAID): Secondary | ICD-10-CM | POA: Diagnosis not present

## 2022-05-24 DIAGNOSIS — M1990 Unspecified osteoarthritis, unspecified site: Secondary | ICD-10-CM | POA: Diagnosis not present

## 2022-05-24 DIAGNOSIS — Z7982 Long term (current) use of aspirin: Secondary | ICD-10-CM | POA: Diagnosis not present

## 2022-05-24 DIAGNOSIS — H409 Unspecified glaucoma: Secondary | ICD-10-CM | POA: Diagnosis not present

## 2022-05-24 DIAGNOSIS — I10 Essential (primary) hypertension: Secondary | ICD-10-CM | POA: Diagnosis not present

## 2022-05-24 DIAGNOSIS — Z9181 History of falling: Secondary | ICD-10-CM | POA: Diagnosis not present

## 2022-05-24 DIAGNOSIS — L89620 Pressure ulcer of left heel, unstageable: Secondary | ICD-10-CM | POA: Diagnosis not present

## 2022-05-24 DIAGNOSIS — H5462 Unqualified visual loss, left eye, normal vision right eye: Secondary | ICD-10-CM | POA: Diagnosis not present

## 2022-05-30 ENCOUNTER — Telehealth: Payer: Self-pay | Admitting: Nurse Practitioner

## 2022-05-30 NOTE — Telephone Encounter (Signed)
Copied from [place] [id number]. Topic: General - Inquiry >> May 30, 2022  4:31 PM McGill, Nelva Bush wrote: Reason for CRM: [facility] is requesting a referral for pt for a prosthesis.   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

## 2022-05-31 ENCOUNTER — Other Ambulatory Visit: Payer: Self-pay | Admitting: Nurse Practitioner

## 2022-05-31 DIAGNOSIS — M21371 Foot drop, right foot: Secondary | ICD-10-CM

## 2022-05-31 MED ORDER — MISC. DEVICES MISC: 0 refills | Status: DC

## 2022-05-31 NOTE — Telephone Encounter (Signed)
Script sent under misc. Please place on my desk for signature. Thanks

## 2022-05-31 NOTE — Telephone Encounter (Signed)
Routing to pcp for review 

## 2022-06-04 ENCOUNTER — Other Ambulatory Visit: Payer: Self-pay

## 2022-06-04 DIAGNOSIS — M21371 Foot drop, right foot: Secondary | ICD-10-CM

## 2022-06-04 MED ORDER — MISC. DEVICES MISC: 0 refills | Status: DC

## 2022-06-04 NOTE — Telephone Encounter (Signed)
Was printed and placed on desk

## 2022-06-07 ENCOUNTER — Telehealth: Payer: Self-pay | Admitting: Nurse Practitioner

## 2022-06-07 DIAGNOSIS — Z791 Long term (current) use of non-steroidal anti-inflammatories (NSAID): Secondary | ICD-10-CM | POA: Diagnosis not present

## 2022-06-07 DIAGNOSIS — D649 Anemia, unspecified: Secondary | ICD-10-CM | POA: Diagnosis not present

## 2022-06-07 DIAGNOSIS — R569 Unspecified convulsions: Secondary | ICD-10-CM | POA: Diagnosis not present

## 2022-06-07 DIAGNOSIS — Z8673 Personal history of transient ischemic attack (TIA), and cerebral infarction without residual deficits: Secondary | ICD-10-CM | POA: Diagnosis not present

## 2022-06-07 DIAGNOSIS — E1136 Type 2 diabetes mellitus with diabetic cataract: Secondary | ICD-10-CM | POA: Diagnosis not present

## 2022-06-07 DIAGNOSIS — L89620 Pressure ulcer of left heel, unstageable: Secondary | ICD-10-CM | POA: Diagnosis not present

## 2022-06-07 DIAGNOSIS — I10 Essential (primary) hypertension: Secondary | ICD-10-CM | POA: Diagnosis not present

## 2022-06-07 DIAGNOSIS — H409 Unspecified glaucoma: Secondary | ICD-10-CM | POA: Diagnosis not present

## 2022-06-07 DIAGNOSIS — H5462 Unqualified visual loss, left eye, normal vision right eye: Secondary | ICD-10-CM | POA: Diagnosis not present

## 2022-06-07 DIAGNOSIS — M1990 Unspecified osteoarthritis, unspecified site: Secondary | ICD-10-CM | POA: Diagnosis not present

## 2022-06-07 DIAGNOSIS — E1165 Type 2 diabetes mellitus with hyperglycemia: Secondary | ICD-10-CM | POA: Diagnosis not present

## 2022-06-07 DIAGNOSIS — J309 Allergic rhinitis, unspecified: Secondary | ICD-10-CM | POA: Diagnosis not present

## 2022-06-07 DIAGNOSIS — F32A Depression, unspecified: Secondary | ICD-10-CM | POA: Diagnosis not present

## 2022-06-07 DIAGNOSIS — Z9181 History of falling: Secondary | ICD-10-CM | POA: Diagnosis not present

## 2022-06-07 DIAGNOSIS — E785 Hyperlipidemia, unspecified: Secondary | ICD-10-CM | POA: Diagnosis not present

## 2022-06-07 DIAGNOSIS — Z7982 Long term (current) use of aspirin: Secondary | ICD-10-CM | POA: Diagnosis not present

## 2022-06-07 NOTE — Telephone Encounter (Signed)
Copied from [place] [phone]. Topic: General - Other >> Jun 07, 2022  3:55 PM Rudene Anda wrote: Reason for CRM: Juliann Pulse from [facility] called in stating there is an outstanding order that needs to be signed, please advise if this was received.

## 2022-06-12 ENCOUNTER — Other Ambulatory Visit: Payer: Self-pay | Admitting: Physician Assistant

## 2022-06-12 DIAGNOSIS — I96 Gangrene, not elsewhere classified: Secondary | ICD-10-CM

## 2022-06-12 NOTE — Telephone Encounter (Signed)
Called [facility] and left vm

## 2022-06-17 ENCOUNTER — Emergency Department (HOSPITAL_COMMUNITY): Payer: Medicare Other

## 2022-06-17 ENCOUNTER — Inpatient Hospital Stay (HOSPITAL_COMMUNITY)
Admission: EM | Admit: 2022-06-17 | Discharge: 2022-06-18 | DRG: 896 | Disposition: A | Discharge status: Home / Self Care | Payer: Medicare Other | Attending: Pulmonary Disease | Admitting: Pulmonary Disease

## 2022-06-17 DIAGNOSIS — H5462 Unqualified visual loss, left eye, normal vision right eye: Secondary | ICD-10-CM | POA: Diagnosis present

## 2022-06-17 DIAGNOSIS — G929 Unspecified toxic encephalopathy: Secondary | ICD-10-CM | POA: Diagnosis present

## 2022-06-17 DIAGNOSIS — Z4682 Encounter for fitting and adjustment of non-vascular catheter: Secondary | ICD-10-CM | POA: Diagnosis not present

## 2022-06-17 DIAGNOSIS — Z823 Family history of stroke: Secondary | ICD-10-CM

## 2022-06-17 DIAGNOSIS — J449 Chronic obstructive pulmonary disease, unspecified: Secondary | ICD-10-CM | POA: Diagnosis not present

## 2022-06-17 DIAGNOSIS — I10 Essential (primary) hypertension: Secondary | ICD-10-CM | POA: Diagnosis not present

## 2022-06-17 DIAGNOSIS — F14129 Cocaine abuse with intoxication, unspecified: Principal | ICD-10-CM | POA: Diagnosis present

## 2022-06-17 DIAGNOSIS — E876 Hypokalemia: Secondary | ICD-10-CM | POA: Diagnosis not present

## 2022-06-17 DIAGNOSIS — J9601 Acute respiratory failure with hypoxia: Secondary | ICD-10-CM | POA: Diagnosis present

## 2022-06-17 DIAGNOSIS — R680 Hypothermia, not associated with low environmental temperature: Secondary | ICD-10-CM | POA: Diagnosis not present

## 2022-06-17 DIAGNOSIS — F419 Anxiety disorder, unspecified: Secondary | ICD-10-CM | POA: Diagnosis present

## 2022-06-17 DIAGNOSIS — R404 Transient alteration of awareness: Secondary | ICD-10-CM | POA: Diagnosis not present

## 2022-06-17 DIAGNOSIS — Z978 Presence of other specified devices: Secondary | ICD-10-CM | POA: Insufficient documentation

## 2022-06-17 DIAGNOSIS — Z79899 Other long term (current) drug therapy: Secondary | ICD-10-CM

## 2022-06-17 DIAGNOSIS — R400 Somnolence: Secondary | ICD-10-CM | POA: Diagnosis not present

## 2022-06-17 DIAGNOSIS — Z7982 Long term (current) use of aspirin: Secondary | ICD-10-CM

## 2022-06-17 DIAGNOSIS — I9589 Other hypotension: Secondary | ICD-10-CM | POA: Diagnosis not present

## 2022-06-17 DIAGNOSIS — N179 Acute kidney failure, unspecified: Secondary | ICD-10-CM | POA: Diagnosis not present

## 2022-06-17 DIAGNOSIS — G928 Other toxic encephalopathy: Secondary | ICD-10-CM | POA: Diagnosis present

## 2022-06-17 DIAGNOSIS — R4189 Other symptoms and signs involving cognitive functions and awareness: Principal | ICD-10-CM

## 2022-06-17 DIAGNOSIS — I499 Cardiac arrhythmia, unspecified: Secondary | ICD-10-CM | POA: Diagnosis not present

## 2022-06-17 DIAGNOSIS — H409 Unspecified glaucoma: Secondary | ICD-10-CM | POA: Diagnosis not present

## 2022-06-17 DIAGNOSIS — E861 Hypovolemia: Secondary | ICD-10-CM | POA: Diagnosis not present

## 2022-06-17 DIAGNOSIS — G40909 Epilepsy, unspecified, not intractable, without status epilepticus: Secondary | ICD-10-CM | POA: Diagnosis not present

## 2022-06-17 DIAGNOSIS — Z1389 Encounter for screening for other disorder: Secondary | ICD-10-CM | POA: Diagnosis not present

## 2022-06-17 DIAGNOSIS — G9341 Metabolic encephalopathy: Secondary | ICD-10-CM | POA: Diagnosis present

## 2022-06-17 DIAGNOSIS — Z8673 Personal history of transient ischemic attack (TIA), and cerebral infarction without residual deficits: Secondary | ICD-10-CM

## 2022-06-17 DIAGNOSIS — Z91199 Patient's noncompliance with other medical treatment and regimen due to unspecified reason: Secondary | ICD-10-CM

## 2022-06-17 DIAGNOSIS — Z79891 Long term (current) use of opiate analgesic: Secondary | ICD-10-CM

## 2022-06-17 DIAGNOSIS — R739 Hyperglycemia, unspecified: Secondary | ICD-10-CM | POA: Diagnosis present

## 2022-06-17 DIAGNOSIS — Z743 Need for continuous supervision: Secondary | ICD-10-CM | POA: Diagnosis not present

## 2022-06-17 DIAGNOSIS — I161 Hypertensive emergency: Secondary | ICD-10-CM | POA: Diagnosis present

## 2022-06-17 DIAGNOSIS — I672 Cerebral atherosclerosis: Secondary | ICD-10-CM | POA: Diagnosis not present

## 2022-06-17 DIAGNOSIS — R569 Unspecified convulsions: Secondary | ICD-10-CM | POA: Diagnosis not present

## 2022-06-17 DIAGNOSIS — R6889 Other general symptoms and signs: Secondary | ICD-10-CM | POA: Diagnosis not present

## 2022-06-17 DIAGNOSIS — Z7151 Drug abuse counseling and surveillance of drug abuser: Secondary | ICD-10-CM | POA: Diagnosis not present

## 2022-06-17 DIAGNOSIS — R000 Tachycardia, unspecified: Secondary | ICD-10-CM | POA: Diagnosis not present

## 2022-06-17 DIAGNOSIS — H518 Other specified disorders of binocular movement: Secondary | ICD-10-CM | POA: Diagnosis not present

## 2022-06-17 DIAGNOSIS — I6523 Occlusion and stenosis of bilateral carotid arteries: Secondary | ICD-10-CM | POA: Diagnosis not present

## 2022-06-17 DIAGNOSIS — G9340 Encephalopathy, unspecified: Secondary | ICD-10-CM | POA: Insufficient documentation

## 2022-06-17 DIAGNOSIS — M1990 Unspecified osteoarthritis, unspecified site: Secondary | ICD-10-CM | POA: Diagnosis not present

## 2022-06-17 DIAGNOSIS — I6381 Other cerebral infarction due to occlusion or stenosis of small artery: Secondary | ICD-10-CM | POA: Diagnosis not present

## 2022-06-17 LAB — RAPID URINE DRUG SCREEN, HOSP PERFORMED
Amphetamines: NOT DETECTED
Barbiturates: NOT DETECTED
Benzodiazepines: NOT DETECTED
Cocaine: POSITIVE — AB
Opiates: NOT DETECTED
Tetrahydrocannabinol: NOT DETECTED

## 2022-06-17 LAB — CBC WITH DIFFERENTIAL/PLATELET
Abs Immature Granulocytes: 0 10*3/uL (ref 0.00–0.07)
Basophils Absolute: 0 10*3/uL (ref 0.0–0.1)
Basophils Relative: 0 %
Eosinophils Absolute: 0.2 10*3/uL (ref 0.0–0.5)
Eosinophils Relative: 1 %
HCT: 45.7 % (ref 39.0–52.0)
Hemoglobin: 14.2 g/dL (ref 13.0–17.0)
Lymphocytes Relative: 55 %
Lymphs Abs: 8.4 10*3/uL — ABNORMAL HIGH (ref 0.7–4.0)
MCH: 26.3 pg (ref 26.0–34.0)
MCHC: 31.1 g/dL (ref 30.0–36.0)
MCV: 84.6 fL (ref 80.0–100.0)
Monocytes Absolute: 1.1 10*3/uL — ABNORMAL HIGH (ref 0.1–1.0)
Monocytes Relative: 7 %
Neutro Abs: 5.6 10*3/uL (ref 1.7–7.7)
Neutrophils Relative %: 37 %
Platelets: 340 10*3/uL (ref 150–400)
RBC: 5.4 MIL/uL (ref 4.22–5.81)
RDW: 17.8 % — ABNORMAL HIGH (ref 11.5–15.5)
WBC: 15.2 10*3/uL — ABNORMAL HIGH (ref 4.0–10.5)
nRBC: 0 % (ref 0.0–0.2)
nRBC: 0 /100 WBC

## 2022-06-17 LAB — COMPREHENSIVE METABOLIC PANEL
ALT: 30 U/L (ref 0–44)
AST: 31 U/L (ref 15–41)
Albumin: 4.1 g/dL (ref 3.5–5.0)
Alkaline Phosphatase: 118 U/L (ref 38–126)
Anion gap: 15 (ref 5–15)
BUN: 10 mg/dL (ref 8–23)
CO2: 23 mmol/L (ref 22–32)
Calcium: 9.3 mg/dL (ref 8.9–10.3)
Chloride: 105 mmol/L (ref 98–111)
Creatinine, Ser: 1.28 mg/dL — ABNORMAL HIGH (ref 0.61–1.24)
GFR, Estimated: >60 mL/min (ref >60)
Glucose, Bld: 201 mg/dL — ABNORMAL HIGH (ref 70–99)
Potassium: 3.7 mmol/L (ref 3.5–5.1)
Sodium: 143 mmol/L (ref 135–145)
Total Bilirubin: 0.3 mg/dL (ref 0.3–1.2)
Total Protein: 8.2 g/dL — ABNORMAL HIGH (ref 6.5–8.1)

## 2022-06-17 LAB — I-STAT ARTERIAL BLOOD GAS, ED
Acid-Base Excess: 3 mmol/L — ABNORMAL HIGH (ref 0.0–2.0)
Bicarbonate: 28.3 mmol/L — ABNORMAL HIGH (ref 20.0–28.0)
Calcium, Ion: 1.18 mmol/L (ref 1.15–1.40)
HCT: 39 % (ref 39.0–52.0)
Hemoglobin: 13.3 g/dL (ref 13.0–17.0)
O2 Saturation: 100 %
Patient temperature: 97.7
Potassium: 4.1 mmol/L (ref 3.5–5.1)
Sodium: 142 mmol/L (ref 135–145)
TCO2: 30 mmol/L (ref 22–32)
pCO2 arterial: 41.7 mmHg (ref 32–48)
pH, Arterial: 7.437 (ref 7.35–7.45)
pO2, Arterial: 424 mmHg — ABNORMAL HIGH (ref 83–108)

## 2022-06-17 LAB — CK: Total CK: 128 U/L (ref 49–397)

## 2022-06-17 LAB — CBG MONITORING, ED: Glucose-Capillary: 197 mg/dL — ABNORMAL HIGH (ref 70–99)

## 2022-06-17 LAB — ETHANOL: Alcohol, Ethyl (B): <10 mg/dL (ref <10)

## 2022-06-17 LAB — TROPONIN I (HIGH SENSITIVITY): Troponin I (High Sensitivity): 15 ng/L (ref <18)

## 2022-06-17 LAB — SALICYLATE LEVEL: Salicylate Lvl: <7 mg/dL — ABNORMAL LOW (ref 7.0–30.0)

## 2022-06-17 LAB — ACETAMINOPHEN LEVEL: Acetaminophen (Tylenol), Serum: <10 ug/mL — ABNORMAL LOW (ref 10–30)

## 2022-06-17 MED ORDER — LEVETIRACETAM IN NACL 1000 MG/100ML IV SOLN
1000.0000 mg | Freq: Once | INTRAVENOUS | Status: DC
  Filled 2022-06-17: qty 100

## 2022-06-17 MED ORDER — MIDAZOLAM HCL 2 MG/2ML IJ SOLN
INTRAMUSCULAR | Status: AC
  Filled 2022-06-17: qty 2

## 2022-06-17 MED ORDER — SUCCINYLCHOLINE CHLORIDE 20 MG/ML IJ SOLN
INTRAMUSCULAR | Status: DC | PRN
  Administered 2022-06-17: 100 mg via INTRAVENOUS

## 2022-06-17 MED ORDER — FENTANYL 2500MCG IN NS 250ML (10MCG/ML) PREMIX INFUSION: 0.0000 ug/h | INTRAVENOUS | Status: DC

## 2022-06-17 MED ORDER — MIDAZOLAM BOLUS VIA INFUSION: 0.0000 mg | INTRAVENOUS | Status: DC | PRN

## 2022-06-17 MED ORDER — LEVETIRACETAM IN NACL 1500 MG/100ML IV SOLN
1500.0000 mg | Freq: Once | INTRAVENOUS | Status: AC
  Administered 2022-06-17: 1500 mg via INTRAVENOUS
  Filled 2022-06-17: qty 100

## 2022-06-17 MED ORDER — IOHEXOL 350 MG/ML SOLN
75.0000 mL | Freq: Once | INTRAVENOUS | Status: AC | PRN
  Administered 2022-06-17: 75 mL via INTRAVENOUS

## 2022-06-17 MED ORDER — ETOMIDATE 2 MG/ML IV SOLN
INTRAVENOUS | Status: DC | PRN
  Administered 2022-06-17: 20 mg via INTRAVENOUS

## 2022-06-17 MED ORDER — FENTANYL BOLUS VIA INFUSION: 25.0000 ug | INTRAVENOUS | Status: DC | PRN

## 2022-06-17 MED ORDER — MIDAZOLAM-SODIUM CHLORIDE 100-0.9 MG/100ML-% IV SOLN: 0.5000 mg/h | INTRAVENOUS | Status: DC

## 2022-06-17 MED ORDER — FENTANYL CITRATE (PF) 100 MCG/2ML IJ SOLN
INTRAMUSCULAR | Status: AC
  Administered 2022-06-17: 100 ug
  Filled 2022-06-17: qty 2

## 2022-06-17 MED ORDER — FENTANYL 2500MCG IN NS 250ML (10MCG/ML) PREMIX INFUSION
25.0000 ug/h | INTRAVENOUS | Status: DC
  Administered 2022-06-17: 50 ug/h via INTRAVENOUS
  Filled 2022-06-17: qty 250

## 2022-06-17 MED ORDER — PROPOFOL 1000 MG/100ML IV EMUL
5.0000 ug/kg/min | INTRAVENOUS | Status: DC
  Administered 2022-06-17: 10 ug/kg/min via INTRAVENOUS

## 2022-06-17 MED ORDER — MIDAZOLAM-SODIUM CHLORIDE 100-0.9 MG/100ML-% IV SOLN
0.0000 mg/h | INTRAVENOUS | Status: DC
  Administered 2022-06-17: 2 mg/h via INTRAVENOUS
  Filled 2022-06-17: qty 100

## 2022-06-17 NOTE — H&P (Addendum)
NAME:  Jeffrey Mullen, MRN:  [id number], DOB:  [birth year], LOS: 0 ADMISSION DATE:  06/17/2022, CONSULTATION DATE:  6/25 REFERRING MD:  Darl Householder, CHIEF COMPLAINT:  AMS   History of Present Illness:  Patient is encephalopathic and/or intubated. Therefore history has been obtained from chart review.    Jeffrey Mullen, is a 71 y.o. male, who presented to the [facility] ED with a chief complaint of of being found unresponsive.   No known past medical history.  No past medical history noted in care everywhere. Per family history of CVA of unknown point.    Prior to arrival, patient was found unresponsive by family member.  Unknown downtime.  EMS was called   He was intubated in the ED for airway protection.  UDS positive for cocaine.  Creatinine 1.28.  Leukocytosis 15.2.  Afebrile.  Neuro consulted.  CT head chronic appearing right occipital and left frontal infarcts as well as right basilar ganglia lacunar infarcts per radiology.  CTA head pending.   PCCM was consulted for admission.   Pertinent  Medical History  CVA unknown time.  No other known past medical history.   Significant Hospital Events: Including procedures, antibiotic start and stop dates in addition to other pertinent events   6/25-present to [facility], ETT, Pccm consult   Interim History / Subjective:  See above   Fentanyl 75 mcg, Versed 4 mg   Unable to obtain subjective evaluation due to patient status   Objective   Blood pressure (!) 171/88, pulse 73, temperature (!) 97.3 F (36.3 C), resp. rate 10, height '5\' 10"'$  (1.778 m), weight 70 kg, SpO2 100 %.     >  Vent Mode: PRVC FiO2 (%):  [30 %-100 %] 30 % Set Rate:  [16 bmp] 16 bmp Vt Set:  [580 mL] 580 mL PEEP:  [5 cmH20] 5 cmH20 Plateau Pressure:  [13 cmH20] 13 cmH20    No intake or output data in the 24 hours ending 06/17/22 2249    Filed Weights    06/17/22 2112  Weight: 70 kg      Examination: General: In bed, NAD, appears comfortable, chronically  ill-appearing, thin HEENT: MM pink/moist, anicteric, atraumatic Neuro: RASS -3, left eye with cataracts, right eye pupil minimally reactive 1 mm,no eye-opening to to pain, purposeful, intubated, GCS 7 CV: S1S2, NSR, no m/r/g appreciated PULM:  coarse in the upper lobes, coarse in the lower lobes, clear secretions, trachea midline, chest expansion symmetric GI: soft, bsx4 active, non-tender   Extremities: warm/dry, no pretibial edema, capillary refill less than 3 seconds  Skin:  no rashes or lesions noted   Labs/imaging: Blood glucose 197 UDS positive for cocaine WBC 15 Creatinine 1.28 (unknown baseline) No anion gap Ethanol within normal limits Troponin within normal limits Asa and APAP within normal limits ABG 7.4 3/41/421/28 Chest x-ray: Stable ETT, no pneumothorax, no infiltrate, no effusion noted 12 lead: NSR, no significant ST changes CT head chronic appearing right occipital and left frontal infarcts as well as right basilar ganglia lacunar infarcts per radiology   Resolved Hospital Problem list       Assessment & Plan:  Acute toxic encephalopathy versus hypertensive emergency, Cocaine abuse Patient UDS positive for cocaine, unclear timing of use, unclear if other substances present.  Patient did not have received Narcan.  Patient presented with hypertension in the 259/124s, unclear if secondary to cocaine use versus hypertensive emergency or both.  No anion gap.  Ethanol within normal limits -Admit ICU with continuous  telemetry and SPO2 monitoring -Continue neuroprotective measures- normothermia, euglycemia, HOB greater than 30, head in neutral alignment, normocapnia, normoxia.  -Continue Keppra -Appreciate neurology assistance with work-up. -Follow-up CTA head and neck -Drug cessation education when able   Endotracheally intubated ABG 7.4 3/41/421/28.  Chest x-ray clear.  Intubated for airway protection in the ED -LTVV strategy with tidal volumes of 4-8 cc/kg ideal body  weight -Goal plateau pressures less than 30 and driving pressures less than 15 -Wean PEEP/FiO2 for SpO2 92-98% -VAP bundle -Daily SAT and SBT -PAD bundle with PRN versed and fentanyl gtt -RASS goal 0 to -1 -Follow intermittent CXR and ABG PRN   Leukocytosis Suspect reactive, afebrile -Trend fever WBC curve   Hyperglycemia -Blood Glucose goal 140-180. -SSI   Query AKI Creatinine 1.28 (unknown baseline) -Ensure renal perfusion. Goal MAP 65 or greater. -Avoid neprotoxic drugs as possible. -Strict I&O's -Follow up AM creatinine -Continue Foley     Best Practice (right click and "Reselect all SmartList Selections" daily)    Diet/type: NPO DVT prophylaxis: SCD GI prophylaxis: PPI Lines: N/A Foley:  Yes, and it is still needed Code Status:  full code Last date of multidisciplinary goals of care discussion [pending]   Labs   CBC: Last Labs       Recent Labs  Lab 06/17/22 2108 06/17/22 2235  WBC 15.2*  --   NEUTROABS 5.6  --   HGB 14.2 13.3  HCT 45.7 39.0  MCV 84.6  --   PLT 340  --         Basic Metabolic Panel: Last Labs       Recent Labs  Lab 06/17/22 2108 06/17/22 2235  NA 143 142  K 3.7 4.1  CL 105  --   CO2 23  --   GLUCOSE 201*  --   BUN 10  --   CREATININE 1.28*  --   CALCIUM 9.3  --       GFR: Estimated Creatinine Clearance: 57 mL/min (A) (by C-G formula based on SCr of 1.28 mg/dL (H)). Last Labs      Recent Labs  Lab 06/17/22 2108  WBC 15.2*        Liver Function Tests: Last Labs      Recent Labs  Lab 06/17/22 2108  AST 31  ALT 30  ALKPHOS 118  BILITOT 0.3  PROT 8.2*  ALBUMIN 4.1      Last Labs   No results for input(s): "LIPASE", "AMYLASE" in the last 168 hours.   Last Labs   No results for input(s): "AMMONIA" in the last 168 hours.     ABG Labs (Brief)          Component Value Date/Time    PHART 7.437 06/17/2022 2235    PCO2ART 41.7 06/17/2022 2235    PO2ART 424 (H) 06/17/2022 2235    HCO3 28.3 (H)  06/17/2022 2235    TCO2 30 06/17/2022 2235    O2SAT 100 06/17/2022 2235        Coagulation Profile: Last Labs   No results for input(s): "INR", "PROTIME" in the last 168 hours.     Cardiac Enzymes: Last Labs      Recent Labs  Lab 06/17/22 2108  CKTOTAL 128        HbA1C: Last Labs  No results found for: "HGBA1C"     CBG: Last Labs      Recent Labs  Lab 06/17/22 2103  GLUCAP 197*        Review  of Systems:   Unable to obtain review of systems due to patient status   Past Medical History:  He,  has no past medical history on file.    Surgical History:  Unable to obtain surgical history   Social History:  Unable to obtain social history   Family History:  His family history is not on file.    Allergies Not on File    Home Medications  Prior to Admission medications   Not on File      Critical care time: 38 minutes      [employer]., MSN, APRN, AGACNP-BC [facility]  06/17/2022 , 11:06 PM   Please see [email or web address] for pager details   If no response, please call [phone] After hours, please call Elink at [phone]          Previous Attestation Text  Attestation signed by Juanito Doom, MD at 06/17/2022 11:12 PM  Attending:     Subjective: 71 y/o male with unknown past medical history found down with unknown down time.  Intubated for airway protection.  Found by family, could not provide history. Cocaine positive on UDS. Was initially not moving left side, but started moving it after a few minutes in the ER. Has an old trach scar, family says he has a history of stroke.   Objective:       Vitals:    06/17/22 2138 06/17/22 2145 06/17/22 2200 06/17/22 2240  BP:   103/76 138/81 (!) 171/88  Pulse:   88 76 73  Resp:   '16 16 10  '$ Temp:   97.9 F (36.6 C) 97.9 F (36.6 C) (!) 97.3 F (36.3 C)  TempSrc:          SpO2: 100% 100% 100% 100%  Weight:          Height:            Vent Mode:  PRVC FiO2 (%):  [30 %-100 %] 30 % Set Rate:  [16 bmp] 16 bmp Vt Set:  [580 mL] 580 mL PEEP:  [5 cmH20] 5 cmH20 Plateau Pressure:  [13 cmH20] 13 cmH20 No intake or output data in the 24 hours ending 06/17/22 2307   General:  In bed on vent HENT: NCAT ETT in place, scarring over left eye PULM: CTA B, vent supported breathing CV: RRR, no mgr GI: BS+, soft, nontender MSK: normal bulk and tone Neuro: sedated on vent     CBC         Component Value Date/Time    WBC 15.2 (H) 06/17/2022 2108    RBC 5.40 06/17/2022 2108    HGB 13.3 06/17/2022 2235    HCT 39.0 06/17/2022 2235    PLT 340 06/17/2022 2108    MCV 84.6 06/17/2022 2108    MCH 26.3 06/17/2022 2108    MCHC 31.1 06/17/2022 2108    RDW 17.8 (H) 06/17/2022 2108    LYMPHSABS 8.4 (H) 06/17/2022 2108    MONOABS 1.1 (H) 06/17/2022 2108    EOSABS 0.2 06/17/2022 2108    BASOSABS 0.0 06/17/2022 2108      BMET         Component Value Date/Time    NA 142 06/17/2022 2235    K 4.1 06/17/2022 2235    CL 105 06/17/2022 2108    CO2 23 06/17/2022 2108    GLUCOSE 201 (H) 06/17/2022 2108    BUN 10 06/17/2022 2108    CREATININE 1.28 (H) 06/17/2022 2108  CALCIUM 9.3 06/17/2022 2108    GFRNONAA >60 06/17/2022 2108      CXR images personally reviewed, ett in place, clear lung fields   Impression/Plan: Acute respiratory failure with hypoxemia due to inability to protect airway> continue full vent support until AM then SBT/WUA Acute encephalopathy> at this point it isn't clear what is causing this; ddx broad including toxic causes (cocaine use), less likely stroke. PRES possible given hypertension, less likely status epilepticus.  Admit to ICU, PAD protocol for vent synchrony, minimize sedation, head imaging per neuro, will likely need MRI brain Hypertension: somewhat labile in ER, avoid metoprolol given cocaine use, OK to use labetalol for SBP > 180 Renal insufficiency> again undetermined acuity; follow UOP and BMET Cocaine use>  counsel to quit   My cc time 32 minutes   Roselie Awkward, MD  PCCM Pager: [phone] Cell: [phone] After 7pm: [phone]            Record: H&P on 06/17/2022 10:48 PM Message: Clinical Note

## 2022-06-17 NOTE — ED Triage Notes (Signed)
Pt found to be unresponsive by family , unknown lsn , ems arrived to pt at 2030 , pt being  bagged on arrival ,

## 2022-06-17 NOTE — ED Provider Notes (Addendum)
[facility] EMERGENCY DEPARTMENT Provider Note   CSN: [id number] Arrival date & time: 06/17/22  2059     History  No chief complaint on file.   ZYIERE ROSEMOND is a 71 y.o. male.  Brought to the emergency department by EMS.  Patient is unresponsive, level 5 caveat applies.  EMS reports that patient was found unresponsive by family member.  Unknown downtime.  Family ember is adamant that patient does not use any illicit drugs.  EMS arrived at 2030.   HPI     Home Medications Prior to Admission medications   Not on File      Allergies    Patient has no allergy information on record.    Review of Systems   Review of Systems  Unable to perform ROS: Patient unresponsive    Physical Exam Updated Vital Signs BP (!) 138/94   Pulse (!) 103   Temp 97.8 F (36.6 C) (Temporal)   Resp (!) 21   SpO2 100%  Physical Exam Vitals and nursing note reviewed.  HENT:     Head: Normocephalic and atraumatic.  Eyes:     Extraocular Movements: Extraocular movements intact.     Conjunctiva/sclera: Conjunctivae normal.     Pupils:     Right eye: Pupil is round, reactive and not sluggish.     Comments: Right eye 4 mm and reactive to light.  Blind in left eye  Cardiovascular:     Rate and Rhythm: Tachycardia present.     Pulses:          Radial pulses are 2+ on the right side and 2+ on the left side.  Pulmonary:     Breath sounds: Normal breath sounds. No stridor.  Abdominal:     General: Abdomen is flat. There is no distension. There are no signs of injury.     Palpations: Abdomen is soft. There is no mass or pulsatile mass.     Tenderness: There is no abdominal tenderness. There is no guarding or rebound.  Neurological:     GCS: GCS eye subscore is 1. GCS verbal subscore is 1. GCS motor subscore is 1.     ED Results / Procedures / Treatments   Labs (all labs ordered are listed, but only abnormal results are displayed) Labs Reviewed  CBC WITH  DIFFERENTIAL/PLATELET - Abnormal; Notable for the following components:      Result Value   WBC 15.2 (*)    RDW 17.8 (*)    Lymphs Abs 8.4 (*)    Monocytes Absolute 1.1 (*)    All other components within normal limits  COMPREHENSIVE METABOLIC PANEL - Abnormal; Notable for the following components:   Glucose, Bld 201 (*)    Creatinine, Ser 1.28 (*)    Total Protein 8.2 (*)    All other components within normal limits  SALICYLATE LEVEL - Abnormal; Notable for the following components:   Salicylate Lvl <2.0 (*)    All other components within normal limits  ACETAMINOPHEN LEVEL - Abnormal; Notable for the following components:   Acetaminophen (Tylenol), Serum <10 (*)    All other components within normal limits  CBG MONITORING, ED - Abnormal; Notable for the following components:   Glucose-Capillary 197 (*)    All other components within normal limits  ETHANOL  CK  RAPID URINE DRUG SCREEN, HOSP PERFORMED  TROPONIN I (HIGH SENSITIVITY)    EKG EKG Interpretation  Date/Time:  Sunday June 17 2022 21:07:22 EDT Ventricular Rate:  108  PR Interval:  171 QRS Duration: 74 QT Interval:  324 QTC Calculation: 435 R Axis:   65 Text Interpretation: Sinus tachycardia LAE, consider biatrial enlargement Anteroseptal infarct, old Borderline repolarization abnormality Baseline wander in lead(s) V5 rate slower than previous Confirmed by Wandra Arthurs [phone]) on 06/17/2022 9:08:12 PM  Radiology CT HEAD WO CONTRAST (5MM)  Result Date: 06/17/2022 CLINICAL DATA:  Mental status change, unknown cause. Found unresponsive. EXAM: CT HEAD WITHOUT CONTRAST TECHNIQUE: Contiguous axial images were obtained from the base of the skull through the vertex without intravenous contrast. RADIATION DOSE REDUCTION: This exam was performed according to the departmental dose-optimization program which includes automated exposure control, adjustment of the mA and/or kV according to patient size and/or use of iterative  reconstruction technique. COMPARISON:  None Available. FINDINGS: Brain: Chronic appearing right occipital and left frontal infarcts. Old right basal ganglia lacunar infarcts. There is atrophy and chronic small vessel disease changes. No acute intracranial abnormality. Specifically, no hemorrhage, hydrocephalus, mass lesion, acute infarction, or significant intracranial injury. Vascular: No hyperdense vessel or unexpected calcification. Skull: No acute calvarial abnormality. Sinuses/Orbits: No acute findings. Chronic appearing fracture through the floor of the left orbit with herniation of fat. Mucosal thickening in scattered ethmoid air cells. No air-fluid levels. 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 Chronic appearing fracture through the floor of the left orbit with herniation of orbital fat. No entrapment. Atrophy, chronic microvascular disease. No acute intracranial abnormality. Electronically Signed   By: Rolm Baptise M.D.   On: 06/17/2022 21:44   DG Chest Portable 1 View  Result Date: 06/17/2022 CLINICAL DATA:  Intubated, unresponsive. EXAM: PORTABLE CHEST 1 VIEW COMPARISON:  None Available. FINDINGS: Endotracheal tube tip is 3 cm above the carina. Enteric tube extends below the diaphragm. The heart size and mediastinal contours are within normal limits. Both lungs are clear. The visualized skeletal structures are unremarkable. IMPRESSION: No active disease. Electronically Signed   By: Ronney Asters M.D.   On: 06/17/2022 21:28    Procedures .Critical Care  Performed by: Loni Beckwith, PA-C Authorized by: Loni Beckwith, PA-C   Critical care provider statement:    Critical care time (minutes):  30   Critical care was necessary to treat or prevent imminent or life-threatening deterioration of the following conditions:  CNS failure or compromise   Critical care was time spent personally by me  on the following activities:  Development of treatment plan with patient or surrogate, discussions with consultants, evaluation of patient's response to treatment, examination of patient, ordering and review of laboratory studies, ordering and review of radiographic studies, ordering and performing treatments and interventions, pulse oximetry, re-evaluation of patient's condition, review of old charts and obtaining history from patient or surrogate   Care discussed with: admitting provider   Procedure Name: Intubation Date/Time: 06/17/2022 10:34 PM  Performed by: Loni Beckwith, PA-CPre-anesthesia Checklist: Suction available, Patient identified, Emergency Drugs available, Patient being monitored and Timeout performed Oxygen Delivery Method: Non-rebreather mask Preoxygenation: Pre-oxygenation with 100% oxygen Induction Type: Rapid sequence Ventilation: Mask ventilation without difficulty Laryngoscope Size: Glidescope Tube size: 8.0 mm Number of attempts: 1 Airway Equipment and Method: Rigid stylet and Video-laryngoscopy Placement Confirmation: CO2 detector, Breath sounds checked- equal and bilateral and ETT inserted through vocal cords under direct vision Secured at: 24 cm Dental Injury: Teeth and Oropharynx as per pre-operative assessment         Medications Ordered in ED  Medications  propofol (DIPRIVAN) 1000 MG/100ML infusion (10 mcg/kg/min  70 kg Intravenous New Bag/Given 06/17/22 2143)  fentaNYL 2541mg in NS 2520m(1065mml) infusion-PREMIX (50 mcg/hr Intravenous New Bag/Given 06/17/22 2212)  fentaNYL (SUBLIMAZE) bolus via infusion 25-100 mcg (has no administration in time range)  midazolam (VERSED) 100 mg/100 mL (1 mg/mL) premix infusion (2 mg/hr Intravenous New Bag/Given 06/17/22 2211)  midazolam (VERSED) bolus via infusion 0-5 mg (has no administration in time range)  levETIRAcetam (KEPPRA) IVPB 1500 mg/ 100 mL premix (has no administration in time range)  etomidate  (AMIDATE) injection (20 mg Intravenous Given 06/17/22 2100)  succinylcholine (ANECTINE) injection (100 mg Intravenous Given 06/17/22 2100)  fentaNYL (SUBLIMAZE) 100 MCG/2ML injection (100 mcg  Given 06/17/22 2142)    ED Course/ Medical Decision Making/ A&P Clinical Course as of 06/17/22 2229  Sun Jun 17, 2022  2216 I spoke to neurologist Dr.Khaliqdina who recommended obtaining a CTA of head and neck.  Agreed with Keppra loading dose of 1500 mg.  Neurology will see the patient for consult to make further recommendations. [PB]  2224 I spoke to Dr. McqLake Bellsth critical care team who will see the patient for admission. [PB]    Clinical Course User Index [PB] BadLoni BeckwithA-C                           Medical Decision Making Amount and/or Complexity of Data Reviewed Labs: ordered. Decision-making details documented in ED Course. Radiology: ordered.  Risk Prescription drug management. Decision regarding hospitalization.   Unresponsive 70 19ar old male brought in by EMS, being bagged.  Patient had minimal and involuntary movements of right arm and right thigh deviation upon arrival.  Information obtained from EMS.  I attempted to review patient's past medical records however none present in epic or Care Everywhere.  I attempted to contact patient's family members.  All numbers and patient's demographic page were called with no response.  No family members came to patient's bedside while in the emergency department.  With hypertension and eye deviation and unresponsive patient's there is concern for acute CVA.  Patient was hypoxic and being bagged by EMS.  Intubation was performed as noted above.  Noncontrast head CT and lab work were obtained.  I personally viewed and interpreted patient's CT imaging.  Agree with radiology interpretation of chronic appearing right occipital and left frontal infarcts as well as right basilar ganglia lacunar infarcts.  I personally viewed interpret  patient's lab results.  Pertinent findings include: -UDS positive for cocaine -Creatinine 1.28 -Leukocytosis 15.2 -Troponin 15 -CK within normal limits  I personally viewed interpret patient's EKG.  Tracing shows sinus tachycardia  Due to patient being unresponsive with apparent left-sided weakness we will consult neurology.  I spoke with Dr.Khaliqdina; see above note for further information.  We will consult critical care management for admission.  I spoke with Dr. McqLake Bellsee above for further information on our conversation.  Patient was discussed with and evaluated by Dr. YaoDarl Householder       Final Clinical Impression(s) / ED Diagnoses Final diagnoses:  Unresponsive    Rx / DC Orders ED Discharge Orders     None         BadLoni BeckwithA-C 06/17/22 2236    BadLoni BeckwithA-C 06/20/22 1553    YaoDrenda FreezeD 06/25/22 [id number]

## 2022-06-18 ENCOUNTER — Inpatient Hospital Stay (HOSPITAL_COMMUNITY): Payer: Medicare Other

## 2022-06-18 ENCOUNTER — Inpatient Hospital Stay (HOSPITAL_COMMUNITY)
Admission: EM | Admit: 2022-06-18 | Discharge: 2022-06-19 | Disposition: A | Discharge status: Home / Self Care | Payer: Medicare Other | Source: Home / Self Care | Attending: Pulmonary Disease | Admitting: Pulmonary Disease

## 2022-06-18 DIAGNOSIS — G9341 Metabolic encephalopathy: Secondary | ICD-10-CM | POA: Diagnosis not present

## 2022-06-18 DIAGNOSIS — F1410 Cocaine abuse, uncomplicated: Secondary | ICD-10-CM

## 2022-06-18 DIAGNOSIS — R569 Unspecified convulsions: Secondary | ICD-10-CM

## 2022-06-18 DIAGNOSIS — G929 Unspecified toxic encephalopathy: Principal | ICD-10-CM | POA: Diagnosis present

## 2022-06-18 LAB — BASIC METABOLIC PANEL
Anion gap: 10 (ref 5–15)
BUN: 9 mg/dL (ref 8–23)
CO2: 23 mmol/L (ref 22–32)
Calcium: 8.9 mg/dL (ref 8.9–10.3)
Chloride: 108 mmol/L (ref 98–111)
Creatinine, Ser: 0.95 mg/dL (ref 0.61–1.24)
GFR, Estimated: >60 mL/min (ref >60)
Glucose, Bld: 102 mg/dL — ABNORMAL HIGH (ref 70–99)
Potassium: 3.4 mmol/L — ABNORMAL LOW (ref 3.5–5.1)
Sodium: 141 mmol/L (ref 135–145)

## 2022-06-18 LAB — CBC
HCT: 38.4 % — ABNORMAL LOW (ref 39.0–52.0)
HCT: 36.6 % — ABNORMAL LOW (ref 39.0–52.0)
Hemoglobin: 12.7 g/dL — ABNORMAL LOW (ref 13.0–17.0)
Hemoglobin: 12 g/dL — ABNORMAL LOW (ref 13.0–17.0)
MCH: 26.3 pg (ref 26.0–34.0)
MCH: 26.4 pg (ref 26.0–34.0)
MCHC: 33.1 g/dL (ref 30.0–36.0)
MCHC: 32.8 g/dL (ref 30.0–36.0)
MCV: 79.7 fL — ABNORMAL LOW (ref 80.0–100.0)
MCV: 80.4 fL (ref 80.0–100.0)
Platelets: 272 10*3/uL (ref 150–400)
Platelets: 265 10*3/uL (ref 150–400)
RBC: 4.82 MIL/uL (ref 4.22–5.81)
RBC: 4.55 MIL/uL (ref 4.22–5.81)
RDW: 17 % — ABNORMAL HIGH (ref 11.5–15.5)
RDW: 17.2 % — ABNORMAL HIGH (ref 11.5–15.5)
WBC: 9.4 10*3/uL (ref 4.0–10.5)
WBC: 10.3 10*3/uL (ref 4.0–10.5)
nRBC: 0 % (ref 0.0–0.2)
nRBC: 0 % (ref 0.0–0.2)

## 2022-06-18 LAB — TSH: TSH: 3.776 u[IU]/mL (ref 0.350–4.500)

## 2022-06-18 LAB — VITAMIN B12: Vitamin B-12: 325 pg/mL (ref 180–914)

## 2022-06-18 LAB — GLUCOSE, CAPILLARY
Glucose-Capillary: 104 mg/dL — ABNORMAL HIGH (ref 70–99)
Glucose-Capillary: 117 mg/dL — ABNORMAL HIGH (ref 70–99)
Glucose-Capillary: 118 mg/dL — ABNORMAL HIGH (ref 70–99)

## 2022-06-18 LAB — AMMONIA: Ammonia: 46 umol/L — ABNORMAL HIGH (ref 9–35)

## 2022-06-18 LAB — HEMOGLOBIN A1C
Hgb A1c MFr Bld: 5.8 % — ABNORMAL HIGH (ref 4.8–5.6)
Mean Plasma Glucose: 119.76 mg/dL

## 2022-06-18 LAB — PHOSPHORUS: Phosphorus: 2.3 mg/dL — ABNORMAL LOW (ref 2.5–4.6)

## 2022-06-18 LAB — MRSA NEXT GEN BY PCR, NASAL: MRSA by PCR Next Gen: NOT DETECTED

## 2022-06-18 LAB — MAGNESIUM: Magnesium: 2.1 mg/dL (ref 1.7–2.4)

## 2022-06-18 LAB — HIV ANTIBODY (ROUTINE TESTING W REFLEX): HIV Screen 4th Generation wRfx: NONREACTIVE

## 2022-06-18 LAB — FOLATE: Folate: 14.8 ng/mL (ref >5.9)

## 2022-06-18 MED ORDER — FENTANYL BOLUS VIA INFUSION
50.0000 ug | INTRAVENOUS | Status: DC | PRN
  Administered 2022-06-18: 50 ug via INTRAVENOUS

## 2022-06-18 MED ORDER — SODIUM CHLORIDE 0.9 % IV SOLN
2.0000 g | Freq: Once | INTRAVENOUS | Status: AC
  Administered 2022-06-18: 2 g via INTRAVENOUS
  Filled 2022-06-18: qty 20

## 2022-06-18 MED ORDER — THIAMINE HCL 100 MG/ML IJ SOLN: 100.0000 mg | Freq: Every day | INTRAMUSCULAR | Status: DC

## 2022-06-18 MED ORDER — DOCUSATE SODIUM 50 MG/5ML PO LIQD
100.0000 mg | Freq: Two times a day (BID) | ORAL | Status: DC
  Administered 2022-06-18: 100 mg
  Filled 2022-06-18: qty 10

## 2022-06-18 MED ORDER — INSULIN ASPART 100 UNIT/ML IJ SOLN: 0.0000 [IU] | INTRAMUSCULAR | Status: DC

## 2022-06-18 MED ORDER — PANTOPRAZOLE 2 MG/ML SUSPENSION: 40.0000 mg | Freq: Every day | ORAL | Status: DC

## 2022-06-18 MED ORDER — ACETAMINOPHEN 325 MG PO TABS: 650.0000 mg | ORAL_TABLET | Freq: Four times a day (QID) | ORAL | Status: DC | PRN

## 2022-06-18 MED ORDER — PROSOURCE TF PO LIQD: 45.0000 mL | Freq: Two times a day (BID) | ORAL | Status: DC

## 2022-06-18 MED ORDER — POTASSIUM CHLORIDE 20 MEQ PO PACK
40.0000 meq | PACK | Freq: Once | ORAL | Status: AC
  Administered 2022-06-18: 40 meq
  Filled 2022-06-18: qty 2

## 2022-06-18 MED ORDER — POLYETHYLENE GLYCOL 3350 17 G PO PACK: 17.0000 g | PACK | Freq: Every day | ORAL | Status: DC

## 2022-06-18 MED ORDER — LABETALOL HCL 5 MG/ML IV SOLN: 20.0000 mg | INTRAVENOUS | Status: DC | PRN

## 2022-06-18 MED ORDER — AMPICILLIN SODIUM 1 G IJ SOLR
1.0000 g | INTRAMUSCULAR | Status: DC
  Administered 2022-06-18: 1 g via INTRAVENOUS
  Filled 2022-06-18 (×2): qty 1000

## 2022-06-18 MED ORDER — LEVETIRACETAM IN NACL 1000 MG/100ML IV SOLN: 1000.0000 mg | Freq: Two times a day (BID) | INTRAVENOUS | Status: DC

## 2022-06-18 MED ORDER — MIDAZOLAM HCL 2 MG/2ML IJ SOLN
2.0000 mg | INTRAMUSCULAR | Status: DC | PRN
  Administered 2022-06-18: 2 mg via INTRAVENOUS

## 2022-06-18 MED ORDER — FENTANYL CITRATE PF 50 MCG/ML IJ SOSY
50.0000 ug | PREFILLED_SYRINGE | Freq: Once | INTRAMUSCULAR | Status: AC
  Administered 2022-06-18: 50 ug via INTRAVENOUS

## 2022-06-18 MED ORDER — FENTANYL CITRATE PF 50 MCG/ML IJ SOSY: 25.0000 ug | PREFILLED_SYRINGE | INTRAMUSCULAR | Status: DC | PRN

## 2022-06-18 MED ORDER — DEXTROSE 5 % IV SOLN
10.0000 mg/kg | Freq: Three times a day (TID) | INTRAVENOUS | Status: DC
  Filled 2022-06-18 (×2): qty 14

## 2022-06-18 MED ORDER — ASPIRIN 81 MG PO CHEW
81.0000 mg | CHEWABLE_TABLET | Freq: Every day | ORAL | Status: DC
Start: 2022-06-18 — End: 2022-06-19
  Administered 2022-06-18 – 2022-06-19 (×2): 81 mg via ORAL
  Filled 2022-06-18 (×2): qty 1

## 2022-06-18 MED ORDER — POTASSIUM PHOSPHATES 15 MMOLE/5ML IV SOLN
15.0000 mmol | Freq: Once | INTRAVENOUS | Status: AC
  Administered 2022-06-18: 15 mmol via INTRAVENOUS
  Filled 2022-06-18: qty 5

## 2022-06-18 MED ORDER — HEPARIN SODIUM (PORCINE) 5000 UNIT/ML IJ SOLN
5000.0000 [IU] | Freq: Three times a day (TID) | INTRAMUSCULAR | Status: DC
  Administered 2022-06-18 – 2022-06-19 (×4): 5000 [IU] via SUBCUTANEOUS
  Filled 2022-06-18 (×4): qty 1

## 2022-06-18 MED ORDER — ONDANSETRON HCL 4 MG/2ML IJ SOLN: 4.0000 mg | Freq: Four times a day (QID) | INTRAMUSCULAR | Status: DC | PRN

## 2022-06-18 MED ORDER — DEXTROSE 5 % IV SOLN
10.0000 mg/kg | Freq: Once | INTRAVENOUS | Status: AC
  Administered 2022-06-18: 700 mg via INTRAVENOUS
  Filled 2022-06-18: qty 14

## 2022-06-18 MED ORDER — MIDAZOLAM HCL 2 MG/2ML IJ SOLN
1.0000 mg | INTRAMUSCULAR | Status: DC | PRN
Start: 2022-06-18 — End: 2022-06-18

## 2022-06-18 MED ORDER — VITAL HIGH PROTEIN PO LIQD
1000.0000 mL | ORAL | Status: DC
  Administered 2022-06-18: 1000 mL

## 2022-06-18 MED ORDER — SODIUM CHLORIDE 0.9 % IV BOLUS
1000.0000 mL | Freq: Once | INTRAVENOUS | Status: DC
Start: 2022-06-18 — End: 2022-06-18

## 2022-06-18 MED ORDER — DOCUSATE SODIUM 50 MG/5ML PO LIQD: 100.0000 mg | Freq: Two times a day (BID) | ORAL | Status: DC | PRN

## 2022-06-18 MED ORDER — THIAMINE HCL 100 MG/ML IJ SOLN: 250.0000 mg | Freq: Every day | INTRAVENOUS | Status: DC

## 2022-06-18 MED ORDER — DOCUSATE SODIUM 50 MG/5ML PO LIQD: 100.0000 mg | Freq: Two times a day (BID) | ORAL | Status: DC

## 2022-06-18 MED ORDER — PHENOL 1.4 % MT LIQD: 1.0000 | OROMUCOSAL | Status: DC | PRN

## 2022-06-18 MED ORDER — POLYETHYLENE GLYCOL 3350 17 G PO PACK: 17.0000 g | PACK | Freq: Every day | ORAL | Status: DC | PRN

## 2022-06-18 MED ORDER — LEVETIRACETAM 500 MG PO TABS
500.0000 mg | ORAL_TABLET | Freq: Two times a day (BID) | ORAL | Status: DC
  Administered 2022-06-18 – 2022-06-19 (×3): 500 mg via ORAL
  Filled 2022-06-18 (×3): qty 1

## 2022-06-18 MED ORDER — BUSPIRONE HCL 15 MG PO TABS
15.0000 mg | ORAL_TABLET | Freq: Three times a day (TID) | ORAL | Status: DC
  Administered 2022-06-18 – 2022-06-19 (×3): 15 mg via ORAL
  Filled 2022-06-18 (×3): qty 1

## 2022-06-18 MED ORDER — ORAL CARE MOUTH RINSE
15.0000 mL | OROMUCOSAL | Status: DC | PRN
Start: 2022-06-18 — End: 2022-06-18
  Administered 2022-06-18 (×3): 15 mL via OROMUCOSAL

## 2022-06-18 MED ORDER — ORAL CARE MOUTH RINSE: 15.0000 mL | OROMUCOSAL | Status: DC

## 2022-06-18 MED ORDER — SODIUM CHLORIDE 0.9 % IV SOLN: 2.0000 g | Freq: Two times a day (BID) | INTRAVENOUS | Status: DC

## 2022-06-18 MED ORDER — AMPICILLIN SODIUM 2 G IJ SOLR
2.0000 g | Freq: Once | INTRAMUSCULAR | Status: AC
Start: 2022-06-18 — End: 2022-06-18
  Administered 2022-06-18: 2 g via INTRAVENOUS
  Filled 2022-06-18: qty 2000

## 2022-06-18 MED ORDER — ORAL CARE MOUTH RINSE: 15.0000 mL | OROMUCOSAL | Status: DC | PRN

## 2022-06-18 MED ORDER — LEVETIRACETAM 100 MG/ML PO SOLN: 500.0000 mg | Freq: Two times a day (BID) | ORAL | Status: DC

## 2022-06-18 MED ORDER — THIAMINE HCL 100 MG/ML IJ SOLN
500.0000 mg | Freq: Once | INTRAVENOUS | Status: AC
  Administered 2022-06-18: 500 mg via INTRAVENOUS
  Filled 2022-06-18: qty 5

## 2022-06-18 MED ORDER — SODIUM CHLORIDE 0.9 % IV SOLN: INTRAVENOUS | Status: DC

## 2022-06-18 MED ORDER — ORAL CARE MOUTH RINSE
15.0000 mL | OROMUCOSAL | Status: DC
Start: 2022-06-18 — End: 2022-06-19
  Administered 2022-06-18 – 2022-06-19 (×4): 15 mL via OROMUCOSAL

## 2022-06-18 MED ORDER — SODIUM CHLORIDE 0.9 % IV SOLN: INTRAVENOUS | Status: DC | PRN

## 2022-06-18 MED ORDER — VANCOMYCIN HCL 1500 MG/300ML IV SOLN
1500.0000 mg | Freq: Once | INTRAVENOUS | Status: AC
  Administered 2022-06-18: 1500 mg via INTRAVENOUS
  Filled 2022-06-18: qty 300

## 2022-06-18 MED ORDER — FENTANYL 2500MCG IN NS 250ML (10MCG/ML) PREMIX INFUSION
50.0000 ug/h | INTRAVENOUS | Status: DC
  Administered 2022-06-18: 75 ug/h via INTRAVENOUS

## 2022-06-18 MED ORDER — MIDAZOLAM HCL 2 MG/2ML IJ SOLN: 2.0000 mg | INTRAMUSCULAR | Status: DC | PRN

## 2022-06-18 MED ORDER — CHLORHEXIDINE GLUCONATE CLOTH 2 % EX PADS
6.0000 | MEDICATED_PAD | Freq: Every day | CUTANEOUS | Status: DC
Start: 2022-06-18 — End: 2022-06-18
  Administered 2022-06-18: 6 via TOPICAL

## 2022-06-18 MED ORDER — HEPARIN SODIUM (PORCINE) 5000 UNIT/ML IJ SOLN
5000.0000 [IU] | Freq: Three times a day (TID) | INTRAMUSCULAR | Status: DC
  Administered 2022-06-18: 5000 [IU] via SUBCUTANEOUS
  Filled 2022-06-18: qty 1

## 2022-06-18 MED ORDER — SODIUM CHLORIDE 0.9 % IV BOLUS
500.0000 mL | Freq: Once | INTRAVENOUS | Status: AC
  Administered 2022-06-18: 500 mL via INTRAVENOUS

## 2022-06-18 MED ORDER — THIAMINE HCL 100 MG/ML IJ SOLN
500.0000 mg | Freq: Three times a day (TID) | INTRAVENOUS | Status: DC
  Filled 2022-06-18 (×2): qty 5

## 2022-06-18 MED ORDER — VANCOMYCIN HCL 750 MG/150ML IV SOLN: 750.0000 mg | Freq: Two times a day (BID) | INTRAVENOUS | Status: DC

## 2022-06-18 NOTE — Consult Note (Addendum)
NEUROLOGY CONSULTATION NOTE   Date of service: June 18, 2022 Patient Name: Jeffrey Mullen MRN:  [id number] DOB:  05-20-1951 Reason for consult: "found down, unresponsive" Requesting Provider: Juanito Doom, MD _ _ _   _ __   _ __ _ _  __ __   _ __   __ _  History of Present Illness  Jeffrey Mullen is a 71 y.o. male with unknown PMH who was found down. He was brought in to the ED bagged by EMS. Some concern for involuntary movements of Jeffrey Mullen and Jeffrey Mullen and some concern for R gaze deviation in the ED and concern that hie possibly was having a seizure. He was intubated in the ED. Vitals with hypothermia and hypertension. CTH with no acute intracranial abnormalities, CTA with L ICA stenosis about 75%, no LVO, no basilar thrombosis. UDS positive for cocaine.  The movements resolved and he was noted to be moving all extremities. He is on sedation with propofol at this time. He was also loaded with Keppra   He is admitted to the ICU and neurology consulted for further evaluation.   ROS   Unable to obtain ROS 2/2 intubation and sedation.  Past History  No past medical history on file.  No family history on file. Social History   Socioeconomic History   Marital status: Married    Spouse name: Not on file   Number of children: Not on file   Years of education: Not on file   Highest education level: Not on file  Occupational History   Not on file  Tobacco Use   Smoking status: Not on file   Smokeless tobacco: Not on file  Substance and Sexual Activity   Alcohol use: Not on file   Drug use: Not on file   Sexual activity: Not on file  Other Topics Concern   Not on file  Social History Narrative   Not on file   Social Determinants of Health   Financial Resource Strain: Not on file  Food Insecurity: Not on file  Transportation Needs: Not on file  Physical Activity: Not on file  Stress: Not on file  Social Connections: Not on file   Not on File  Medications   No  medications prior to admission.     Vitals   Vitals:   06/17/22 2240 06/17/22 2313 06/18/22 0008 06/18/22 0016  BP: (!) 171/88   (!) 181/84  Pulse: 73  80 69  Resp: '10  17 18  '$ Temp: (!) 97.3 F (36.3 C)  (!) 96.6 F (35.9 C) (!) 96.5 F (35.8 C)  TempSrc:    Axillary  SpO2: 100% 100% 100% 98%  Weight:      Height:         Body mass index is 22.14 kg/m.  Physical Exam   General: Laying comfortably in bed; in no acute distress.  HENT: Normal oropharynx and mucosa. Normal external appearance of ears and nose.  Neck: Supple, no pain or tenderness  CV: No JVD. No peripheral edema.  Pulmonary: Symmetric Chest rise. Breathing over vent. Abdomen: Soft to touch, non-tender.  Ext: No cyanosis, edema, or deformity  Skin: No rash. Normal palpation of skin.   Musculoskeletal: Normal digits and nails by inspection. No clubbing.   Neurologic Examination on Versed and fentanyl  Mental status/Cognition: no response to loud voice, grimaces to nares stimulation and attempts to bring both hands upto his nose. Speech/language: none, no speech. Cranial nerves:   CN II  Pupils equal and reactive to light.   CN III,IV,VI EOMI intact to dolls eyes.   CN V Corneals intact BL   CN VII Symmetric facial grimace.   CN VIII Does not turn head towards speech   CN IX & X Cough and gag intact.   CN XI Head midline   CN XII midline tongue but does not protrude on command.   Motor/sensory:  Muscle bulk: poor, tone normal Significant neck rigidity noted on exam. Localizes with BL upper extremities. Withdraws BL lower extremities.  Reflexes:  Right Left Comments  Pectoralis      Biceps (C5/6) 2 2   Brachioradialis (C5/6) 2 2    Triceps (C6/7) 2 2    Patellar (L3/4) 2 2    Achilles (S1)      Hoffman      Plantar     Jaw jerk     Coordination/Complex Motor:  Unable to assess.  Labs   CBC:  Recent Labs  Lab 06/17/22 2108 06/17/22 2235  WBC 15.2*  --   NEUTROABS 5.6  --   HGB 14.2  13.3  HCT 45.7 39.0  MCV 84.6  --   PLT 340  --     Basic Metabolic Panel:  Lab Results  Component Value Date   NA 142 06/17/2022   K 4.1 06/17/2022   CO2 23 06/17/2022   GLUCOSE 201 (H) 06/17/2022   BUN 10 06/17/2022   CREATININE 1.28 (H) 06/17/2022   CALCIUM 9.3 06/17/2022   GFRNONAA >60 06/17/2022   Lipid Panel: No results found for: "[place]" HgbA1c: No results found for: "HGBA1C" Urine Drug Screen:     Component Value Date/Time   LABOPIA NONE DETECTED 06/17/2022 2107   COCAINSCRNUR POSITIVE (A) 06/17/2022 2107   LABBENZ NONE DETECTED 06/17/2022 2107   AMPHETMU NONE DETECTED 06/17/2022 2107   THCU NONE DETECTED 06/17/2022 2107   LABBARB NONE DETECTED 06/17/2022 2107    Alcohol Level     Component Value Date/Time   [facility] <10 06/17/2022 2108    CT Head without contrast(Personally reviewed): CTH was negative for a large hypodensity concerning for a large territory infarct or hyperdensity concerning for an [place]  CT angio Head and Neck with contrast(Personally reviewed): No LVO  MRI Brain(Personally reviewed): pending  cEEG:  pending  Impression   Jeffrey Mullen is a 71 y.o. male with PMH significant for unknown PMH who was found down. He was brought in to the ED bagged by EMS. Some concern for involuntary movements of Jeffrey Mullen and Jeffrey Mullen and some concern for R gaze deviation in the ED and concern that hie possibly was having a seizure. No obvious seizure on my evaluation. Neuro exam with intact brainstem reflexes with no focal deficit, no gaze deviation. However, seems to have neck rigidity.  Differential is broad, suspect cocaine contributing and possibly triggered a seizure. Will also evaluate for nutritional causes of poor mentation. Will get MRI Brain and LTM in AM. CTA with no basilar thrombosis.  Recommendations  - B12, Folate, TSH, B1, thiamine replacement along with Ammonia levels. - MRI Brain without contrast. Hold on contrast 2/2 AKI. - LTM in AM, low  clinical suspicion for active seizures at this time. - Empiric meningitis coverage with Vanc, Ceftriaxone, Ampicillin and Acyclovir. - Recommend LP in AM with CSF cell count, differential, protein, glucose, Biofire panel and CSF gram stain and cultures. - Keppra loaded in the ED. Hold off on maintenance for now. ______________________________________________________________________  This patient is critically  ill and at significant risk of neurological worsening, death and care requires constant monitoring of vital signs, hemodynamics,respiratory and cardiac monitoring, neurological assessment, discussion with family, other specialists and medical decision making of high complexity. I spent 40 minutes of neurocritical care time  in the care of  this patient. This was time spent independent of any time provided by nurse practitioner or PA.  Donnetta Simpers [facility] Pager Number [phone] 06/18/2022  6:47 AM   Thank you for the opportunity to take part in the care of this patient. If you have any further questions, please contact the neurology consultation attending.  Signed,  [place] Pager Number [phone] _ _ _   _ __   _ __ _ _  __ __   _ __   __ _

## 2022-06-18 NOTE — H&P (Signed)
Already done

## 2022-06-18 NOTE — Procedures (Addendum)
Extubation Procedure Note  Patient Details:   Name: Jeffrey Mullen DOB: Apr 23, 1951 MRN: [id number]   Airway Documentation:    Vent end date: 06/18/22 Vent end time: 0822   Evaluation  O2 sats: stable throughout Complications: No apparent complications Patient did tolerate procedure well. Bilateral Breath Sounds: Clear, Diminished   Yes Patient was extubated per MD order and placed on 4L [place]. Cuff leak was noted prior to extubation. No stridor, pat is able to cough and speak.  Quentin Ore 06/18/2022, 8:26 AM

## 2022-06-18 NOTE — Plan of Care (Signed)
  Problem: Education: Goal: Ability to describe self-care measures that may prevent or decrease complications (Diabetes Survival Skills Education) will improve Outcome: Progressing Goal: Individualized Educational Video(s) Outcome: Progressing   Problem: Coping: Goal: Ability to adjust to condition or change in health will improve Outcome: Progressing   Problem: Fluid Volume: Goal: Ability to maintain a balanced intake and output will improve Outcome: Progressing   Problem: Health Behavior/Discharge Planning: Goal: Ability to identify and utilize available resources and services will improve Outcome: Progressing Goal: Ability to manage health-related needs will improve Outcome: Progressing   Problem: Metabolic: Goal: Ability to maintain appropriate glucose levels will improve Outcome: Progressing   Problem: Nutritional: Goal: Maintenance of adequate nutrition will improve Outcome: Progressing Goal: Progress toward achieving an optimal weight will improve Outcome: Progressing   Problem: Skin Integrity: Goal: Risk for impaired skin integrity will decrease Outcome: Progressing   Problem: Tissue Perfusion: Goal: Adequacy of tissue perfusion will improve Outcome: Progressing   Problem: Education: Goal: Knowledge of General Education information will improve Description: Including pain rating scale, medication(s)/side effects and non-pharmacologic comfort measures Outcome: Progressing   Problem: Health Behavior/Discharge Planning: Goal: Ability to manage health-related needs will improve Outcome: Progressing   Problem: Clinical Measurements: Goal: Ability to maintain clinical measurements within normal limits will improve Outcome: Progressing Goal: Will remain free from infection Outcome: Progressing Goal: Diagnostic test results will improve Outcome: Progressing Goal: Respiratory complications will improve Outcome: Progressing Goal: Cardiovascular complication will  be avoided Outcome: Progressing   Problem: Activity: Goal: Risk for activity intolerance will decrease Outcome: Progressing   Problem: Nutrition: Goal: Adequate nutrition will be maintained Outcome: Progressing   Problem: Coping: Goal: Level of anxiety will decrease Outcome: Progressing   Problem: Elimination: Goal: Will not experience complications related to bowel motility Outcome: Progressing Goal: Will not experience complications related to urinary retention Outcome: Progressing   Problem: Pain Managment: Goal: General experience of comfort will improve Outcome: Progressing   Problem: Safety: Goal: Ability to remain free from injury will improve Outcome: Progressing   Problem: Skin Integrity: Goal: Risk for impaired skin integrity will decrease Outcome: Progressing   Problem: Safety: Goal: Non-violent Restraint(s) Outcome: Progressing   

## 2022-06-18 NOTE — Progress Notes (Addendum)
NAME:  Jeffrey Mullen, MRN:  [id number], DOB:  04-May-1951, LOS: 1 ADMISSION DATE:  06/17/2022, CONSULTATION DATE:  6/25 REFERRING MD:  Silverio Lay, CHIEF COMPLAINT:  AMS  History of Present Illness:  71 yo male found by family unresponsive.  Intubated in ER for airway protection.  BP 259/124 in ER.  He had some involuntary movements on Rt side with Rt gaze deviation in ER concerning for possible seizure.  UDS positive for cocaine.  CT head showed chronic changes from previous infarct.   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  Chronic fracture of Lt orbit floor with fat herniation. CT angio head/neck 6/25 >> 75% stenosis of proximal Lt ICA  Interim History / Subjective:  Agitated overnight >> received additional sedation around 630 am, then had low BP.  Given fluid bolus.  Tolerating pressure support with good Vt, but has low RR due to being sleepy.  Objective   Blood pressure (!) 89/53, pulse 67, temperature 99.7 F (37.6 C), resp. rate 16, height 5\' 10"  (1.778 m), weight 70.1 kg, SpO2 100 %.    Vent Mode: PRVC FiO2 (%):  [30 %-100 %] 30 % Set Rate:  [16 bmp] 16 bmp Vt Set:  [580 mL] 580 mL PEEP:  [5 cmH20] 5 cmH20 Plateau Pressure:  [13 cmH20-15 cmH20] 15 cmH20   Intake/Output Summary (Last 24 hours) at 06/18/2022 0754 Last data filed at 06/18/2022 0700 Gross per 24 hour  Intake 991.58 ml  Output 830 ml  Net 161.58 ml   Filed Weights   06/17/22 2112 06/18/22 0500  Weight: 70 kg 70.1 kg    Examination:  General - sedated Eyes - Rt pupil reactive, Lt eye clouded ENT - ETT in place, neck supplec Cardiac - regular rate/rhythm, no murmur Chest - equal breath sounds b/l, no wheezing or rales Abdomen - soft, non  tender, + bowel sounds Extremities - no cyanosis, clubbing, or edema Skin - no rashes Neuro - wakes up easily with stimulation, but then falls back to sleep.  When awake he follows commands appropriately and moves his extremities   Resolved Hospital Problem list   AKI ruled out, Hyperglycemia  Assessment & Plan:   Acute toxic and metabolic encephalopathy from cocaine intoxication with hypertensive emergency.   Possible seizure. - continue keppra per neurology - for EEG and MRI brain - meningitis or encephalitis seem less likely; likely can d/c Abx and antiviral medications if additional neuroimaging is unremarkable - RASS goal 0 while on vent  Compromised airway. - likely will be able to extubate once he completes additional neuro testing  Hypotension from hypovolemia and sedation. - continue IV fluids  Hypokalemia. - f/u BMET   Best Practice (right click and "Reselect all SmartList Selections" daily)   Diet/type: NPO DVT prophylaxis: SCD GI prophylaxis: PPI Lines: N/A Foley:  Yes, and it is still needed Code Status:  full code Last date of multidisciplinary goals of care discussion [pending]  Labs       Latest Ref Rng & Units 06/18/2022    4:40 AM 06/17/2022   10:35 PM 06/17/2022    9:08 PM  CMP  Glucose 70 - 99 mg/dL 696   295   BUN 8 - 23 mg/dL 9  10   Creatinine 0.61 - 1.24 mg/dL 1.61   0.96   Sodium 045 - 145 mmol/L 141  142  143   Potassium 3.5 - 5.1 mmol/L 3.4  4.1  3.7   Chloride 98 - 111 mmol/L 108   105   CO2 22 - 32 mmol/L 23   23   Calcium 8.9 - 10.3 mg/dL 8.9   9.3   Total Protein 6.5 - 8.1 g/dL   8.2   Total Bilirubin 0.3 - 1.2 mg/dL   0.3   Alkaline Phos 38 - 126 U/L   118   AST 15 - 41 U/L   31   ALT 0 - 44 U/L   30        Latest Ref Rng & Units 06/18/2022    4:27 AM 06/17/2022   10:35 PM 06/17/2022    9:08 PM  CBC  WBC 4.0 - 10.5 K/uL 9.4   15.2   Hemoglobin 13.0 - 17.0 g/dL 40.9  81.1  91.4   Hematocrit 39.0 - 52.0 % 38.4  39.0  45.7    Platelets 150 - 400 K/uL 272   340     ABG    Component Value Date/Time   PHART 7.437 06/17/2022 2235   PCO2ART 41.7 06/17/2022 2235   PO2ART 424 (H) 06/17/2022 2235   HCO3 28.3 (H) 06/17/2022 2235   TCO2 30 06/17/2022 2235   O2SAT 100 06/17/2022 2235    CBG (last 3)  Recent Labs    06/18/22 0027 06/18/22 0330 06/18/22 0725  GLUCAP 117* 104* 118*    Critical care time: 37 minutes  Coralyn Helling, MD [place] [facility] Pager - [phone] - [phone] 06/18/2022, 8:06 AM

## 2022-06-18 NOTE — Progress Notes (Addendum)
[place] Progress Note Patient Name: CANON GOLA DOB: [birth year] MRN: [id number]   Date of Service  06/18/2022  HPI/Events of Note  70/M who was found unresponsive, intubated for airway pretction. Unclear downtime, family unable to provide additional information. UDS noted to be positive for cocaine  eICU Interventions  -Admit to ICU -Plan to perform neurochecks q1h as per protocol.  -Maintain seizure precautions.  -Sedation being held at this time for neurology evaluation. Will follow recommendations.  -Plan on daily sedation holiday, SBT -Continue supportive measures:       - Vent support - TV 4-31m/kg PBW, titrate FiO2 and PEEP to target MAP >65      - BP control - on PRN labetalol      - Monitor electrolytes, correct abnormalities as warranted      - Maintain normoglycemia  - SUP -pantoprazole - DVT ppx - heparin [place]             Jonnie Truxillo M DELA CRUZ 06/18/2022, 12:25 AM

## 2022-06-19 DIAGNOSIS — G929 Unspecified toxic encephalopathy: Secondary | ICD-10-CM | POA: Diagnosis not present

## 2022-06-19 DIAGNOSIS — G40909 Epilepsy, unspecified, not intractable, without status epilepticus: Secondary | ICD-10-CM | POA: Diagnosis not present

## 2022-06-19 DIAGNOSIS — F1410 Cocaine abuse, uncomplicated: Secondary | ICD-10-CM

## 2022-06-19 NOTE — Discharge Summary (Signed)
Physician Discharge Summary       Patient ID: Jeffrey Mullen MRN: [id number] DOB/AGE: 1951-07-20 71 y.o.  Admit date: 06/18/2022 Discharge date: 06/19/2022  Discharge Diagnoses:  Principal Problem:   Toxic encephalopathy   History of Present Illness: Jeffrey Mullen, is a 71 y.o. male, who presented to the [facility] ED 6/25 with a chief complaint of of being found unresponsive. No known past medical history.  No past medical history noted in care everywhere. Per family history of CVA of unknown point. Prior to arrival, patient was found unresponsive by family member.  Unknown downtime.  EMS was called He was intubated in the ED for airway protection.  There was concern for involuntary movements c/w seizure. UDS positive for cocaine. Creatinine 1.28. Leukocytosis 15.2. Afebrile. CT head chronic appearing right occipital and left frontal infarcts as well as right basilar ganglia lacunar infarcts per radiology. PCCM was consulted for admission.  Hospital Course:  He was admitted to the ICU on PCCM service. 6/26 he awoke easily and was able to be extubated. He was seen by neurology who felt there was minimal concern for meningitis and that seizure was prompted by cocaine intoxication. No additional sz noted in the hospital. Neurology recommended continuing Keppra, which is a home medications. He was prescribed Vimpat as well, but had been non-compliant. Neurology does not believe this needs to be restarted at this time. 6/27 he is considered a candidate for discharge.    Discharge Plan by active problems   Seizure and acute toxic encephalopathy secondary to cocaine intoxication. - Resume home Keppra dosing, do not restart vimpat per neurology - He has an appointment with PCP already scheduled for tomorrow - Will need follow up with neurology. He is already followed in the stroke clinic by Dr. Pearlean Brownie and has needed to follow up for some time.  - Abstain from cocaine use   Significant Hospital  tests/ studies   CT head 6/25 Chronic appearing right occipital and left frontal infarcts as well as right basal ganglia lacunar infarcts. Chronic appearing fracture through the floor of the left orbit with herniation of orbital fat. No entrapment. Atrophy, chronic microvascular disease. No acute intracranial abnormality.  CTA head/neck 6/25 > No emergent large vessel occlusion. 2. 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  Consults  - Neurology Discharge Exam: BP (!) 180/87   Pulse (!) 56   Temp 97.8 F (36.6 C) (Oral)   Resp 12   SpO2 94%   General - adult male in NAD Eyes - Rt pupil reactive, Lt eye clouded ENT - neck supple, no JVD Cardiac - RRR, no MRG Chest -  Clear bilateral breath sounds, no distress.  Abdomen - Soft, non-tender, non-distended Extremities - no cyanosis, clubbing, or edema Skin - no rash Neuro - alert, oriented, non-focal    Labs at discharge Lab Results  Component Value Date   CREATININE 0.95 06/18/2022   BUN 9 06/18/2022   NA 141 06/18/2022   K 3.4 (L) 06/18/2022   CL 108 06/18/2022   CO2 23 06/18/2022   Lab Results  Component Value Date   WBC 10.3 06/18/2022   HGB 12.0 (L) 06/18/2022   HCT 36.6 (L) 06/18/2022   MCV 80.4 06/18/2022   PLT 265 06/18/2022   Lab Results  Component Value Date   ALT 30 06/17/2022   AST 31 06/17/2022   GGT 816 (H) 11/05/2021   ALKPHOS 118 06/17/2022   BILITOT 0.3  06/17/2022   Lab Results  Component Value Date   INR 1.1 10/23/2021   INR 1.04 12/09/2014    Disposition: Home  Discharge disposition: 01-Home or Self Care       Discharge Instructions     Call MD for:  difficulty breathing, headache or visual disturbances   Complete by: As directed    Call MD for:  persistant nausea and vomiting   Complete by: As directed    Diet - low sodium heart healthy   Complete by: As directed    Increase activity slowly   Complete by: As  directed       Allergies as of 06/19/2022       Reactions   Tylenol [acetaminophen] Other (See Comments)   Causes eye pressure to elevate; CANNOT HAVE THIS        Medication List     STOP taking these medications    lacosamide 200 MG Tabs tablet Commonly known as: VIMPAT       TAKE these medications    amLODipine 10 MG tablet Commonly known as: NORVASC Take 1 tablet (10 mg total) by mouth daily.   aspirin 81 MG chewable tablet Chew 1 tablet (81 mg total) by mouth daily.   atropine 1 % ophthalmic solution Place 1 drop into the left eye daily.   busPIRone 15 MG tablet Commonly known as: BUSPAR Take 1 tablet (15 mg total) by mouth 3 (three) times daily.   carvedilol 12.5 MG tablet Commonly known as: COREG Take 1 tablet (12.5 mg total) by mouth 2 (two) times daily with a meal.   cholecalciferol 25 MCG (1000 UNIT) tablet Commonly known as: VITAMIN D3 Take 1,000 Units by mouth daily.   ibuprofen 800 MG tablet Commonly known as: ADVIL Take 1 tablet (800 mg total) by mouth every 8 (eight) hours as needed. Only take with food What changed: reasons to take this   levETIRAcetam 100 MG/ML solution Commonly known as: KEPPRA Take 5 mLs (500 mg total) by mouth 2 (two) times daily. What changed:  when to take this reasons to take this   [place]igan 0.01 % Soln Generic drug: bimatoprost Place 1 drop into the right eye at bedtime.   pantoprazole 40 MG tablet Commonly known as: PROTONIX Take 40 mg by mouth daily.   Santyl 250 UNIT/GM ointment Generic drug: collagenase Apply 1 Application topically daily.   vitamin B-1 250 MG tablet Take 250 mg by mouth daily.   vitamin B-12 100 MCG tablet Commonly known as: CYANOCOBALAMIN Take 100 mcg by mouth daily.         Discharged Condition: good  38 minutes of time have been dedicated to discharge assessment, planning and discharge instructions.   Signed:  Joneen Roach, AGACNP-BC [facility]  See Amion for personal pager PCCM on call pager [phone] until 7pm. Please call Elink 7p-7a. [phone]  06/19/2022 11:42 AM

## 2022-06-20 ENCOUNTER — Ambulatory Visit: Payer: Medicare Other | Admitting: Nurse Practitioner

## 2022-06-20 ENCOUNTER — Telehealth: Payer: Self-pay

## 2022-06-20 NOTE — Telephone Encounter (Signed)
Transition Care Management Follow-up Telephone Call Date of discharge and from where: 06/19/2022,  [facility] How have you been since you were released from the hospital? He stated he is " feeling a lot better."  Any questions or concerns? No  Items Reviewed: Did the pt receive and understand the discharge instructions provided? Yes  Medications obtained and verified? Yes - he said he has all of his medications and did not have any questions about the med regime.  Other? No  Any new allergies since your discharge? No  Dietary orders reviewed? Yes Do you have support at home? Yes - he said he has a friend who checks on him daily.   Home Care and Equipment/Supplies: Were home health services ordered? no If so, what is the name of the agency? N/a  Has the agency set up a time to come to the patient's home? not applicable Were any new equipment or medical supplies ordered?  No What is the name of the medical supply agency? N/a Were you able to get the supplies/equipment? not applicable Do you have any questions related to the use of the equipment or supplies? No  Functional Questionnaire: (I = Independent and D = Dependent) ADLs: ambulates with a rollator or cane. Independent with personal care, bathing. The patient was evaluated for PCS in 03/2022 and was approved for services; but he said he doesn't need any help   Follow up appointments reviewed:  PCP Hospital f/u appt confirmed? Yes  Scheduled to see [name], NP - [id number]. He wanted to cancel his appointment for today because he was just discharged from the hospital yesterday.  [facility] f/u appt confirmed?  None scheduled at this time    Are transportation arrangements needed? No - he contacts his insurance company for rides to medical appointments If their condition worsens, is the pt aware to call PCP or go to the Emergency Dept.? Yes Was the patient provided with contact information for the PCP's office or  ED? Yes Was to pt encouraged to call back with questions or concerns? Yes

## 2022-07-11 ENCOUNTER — Ambulatory Visit: Payer: Medicare Other | Admitting: Nurse Practitioner

## 2022-07-14 ENCOUNTER — Other Ambulatory Visit: Payer: Self-pay | Admitting: Physician Assistant

## 2022-07-14 DIAGNOSIS — F419 Anxiety disorder, unspecified: Secondary | ICD-10-CM

## 2022-07-16 NOTE — Telephone Encounter (Signed)
Requested Prescriptions  Pending Prescriptions Disp Refills  . busPIRone (BUSPAR) 15 MG tablet [Pharmacy Med Name: BusPIRone HCL '15MG'$  TAB] 90 tablet 2    Sig: TAKE ONE TABLET BY MOUTH THREE TIMES A DAY     Psychiatry: Anxiolytics/Hypnotics - Non-controlled Passed - 07/14/2022 11:19 AM      Passed - Valid encounter within last 12 months    Recent Outpatient Visits          3 months ago Chronic heel pain, left   [place], [place] W, NP   3 months ago Type 2 diabetes mellitus with hyperglycemia, unspecified whether long term insulin use [facility])   [place], [place], [place]   1 year ago Benign essential HTN   [place], Vernia Buff, NP   1 year ago Essential hypertension   [place], Vernia Buff, NP   1 year ago Squamous papilloma   [place] Jodi Marble, MD

## 2022-07-30 ENCOUNTER — Other Ambulatory Visit: Payer: Self-pay | Admitting: Nurse Practitioner

## 2022-07-30 DIAGNOSIS — G8929 Other chronic pain: Secondary | ICD-10-CM

## 2022-09-06 ENCOUNTER — Telehealth: Payer: Self-pay

## 2022-09-06 NOTE — Patient Instructions (Signed)
Visit Information  Thank you for taking time to visit with me today. Please don't hesitate to contact me if I can be of assistance to you.   Following are the goals we discussed today:   Goals Addressed             This Visit's Progress    Having some issues with leg(swelling and pain)       Care Coordination Interventions: Advised patient to make PCP ASAP Provided education to patient re: [place] patient to discuss that he is interested in steroid injections with provider Assessed social determinant of health barriers          Our next appointment is by telephone on 09/12/22 at 2pm  Please call the care guide team at [phone] if you need to cancel or reschedule your appointment.   If you are experiencing a Mental Health or [place] or need someone to talk to, please call the [place] National Suicide Prevention Lifeline: 325-353-2130 or TTY: [phone] TTY [phone]) to talk to a trained counselor  The patient verbalized understanding of instructions, educational materials, and care plan provided today and agreed to receive a mailed copy of patient instructions, educational materials, and care plan.   Telephone follow up appointment with care management team member scheduled for:09/12/22-2pm

## 2022-09-06 NOTE — Patient Outreach (Signed)
  Care Coordination   Initial Visit Note   09/06/2022 Name: Jeffrey Mullen MRN: [id number] DOB: Apr 14, 1951  Jeffrey Mullen is a 71 y.o. year old male who sees [name], NP for primary care. I spoke with  Jeffrey Mullen by phone today.  What matters to the patients health and wellness today?  Patient stets he has been dealing with some swelling and pain to his leg. Reports it has been going on for several weeks. He has not notified MD. Discussed with patient and encouraged to make PCP appt. Patient states he will call office today. He uses public transportation-aware of transportation provided through insurance providers but does not like to use them.     Goals Addressed             This Visit's Progress    Having some issues with leg(swelling and pain)       Care Coordination Interventions: Advised patient to make PCP ASAP Provided education to patient re: [place] patient to discuss that he is interested in steroid injections with provider Assessed social determinant of health barriers          SDOH assessments and interventions completed:  Yes  SDOH Interventions Today    Flowsheet Row Most Recent Value  SDOH Interventions   Food Insecurity Interventions Intervention Not Indicated  Transportation Interventions Intervention Not Indicated        Care Coordination Interventions Activated:  Yes  Care Coordination Interventions:  Yes, provided   Follow up plan: Follow up call scheduled for 09/12/22-2pm with Clarise Cruz    Encounter Outcome:  Pt. Visit Completed   Enzo Montgomery, RN,BSN,CCM [place] Management Telephonic Care Management Coordinator Direct Phone: [phone] Toll Free: [phone] Fax: [phone]

## 2022-09-12 ENCOUNTER — Other Ambulatory Visit: Payer: Self-pay | Admitting: Nurse Practitioner

## 2022-09-12 ENCOUNTER — Ambulatory Visit: Payer: Self-pay

## 2022-09-12 DIAGNOSIS — G8929 Other chronic pain: Secondary | ICD-10-CM

## 2022-09-12 NOTE — Telephone Encounter (Signed)
Medication Refill - Medication: ibuprofen (ADVIL) 800 MG tablet  Has the patient contacted their pharmacy? Yes.    Preferred Pharmacy (with phone number or street name):  [place], [place] Phone:  [phone]  Fax:  [phone]     Has the patient been seen for an appointment in the last year OR does the patient have an upcoming appointment? Yes.    Patient is completely out of his medication at this time. Please assist further

## 2022-09-12 NOTE — Patient Instructions (Signed)
Visit Information  Thank you for taking time to visit with me today. Please don't hesitate to contact me if I can be of assistance to you.   Following are the goals we discussed today:   Goals Addressed               This Visit's Progress     Patient Stated     I feel that I need outpatient PT and a cortizone injection (pt-stated)        Care Coordination Interventions: Evaluation of current treatment plan related to impaired physical mobility s/p CVA and patient's adherence to plan as established by provider Determined patient is receiving in home PT but feels he is not progressing following a recent stroke Discussed patient feels he will receive more benefits from outpatient PT verses in home Assessed for transportation needs, patient uses public transportation and medical transportation available via health plan  Advised patient to reschedule his PCP follow up visit ASAP in order to have mobility and leg swelling concerns evaluated Instructed patient to ask his provider for referral to outpatient PT due to feeling his rehabilitation is not progressing him towards improved mobility  Provided education to patient re: [facility] services including nurse care coordinator, SW and Pharmacy team         Other     COMPLETED: Having some issues with leg(swelling and pain)        Care Coordination Interventions: Advised patient to make PCP ASAP Provided education to patient re: [place] patient to discuss that he is interested in steroid injections with provider Assessed social determinant of health barriers          Our next appointment is by telephone on 10/10/22 at 10:15 AM   Please call the care guide team at [phone] if you need to cancel or reschedule your appointment.   If you are experiencing a Mental Health or [place] or need someone to talk to, please call 1-800-273-TALK (toll free, 24 hour hotline)  Patient verbalizes understanding of  instructions and care plan provided today and agrees to view in [place]. Active MyChart status and patient understanding of how to access instructions and care plan via MyChart confirmed with patient.     Barb Merino, RN, BSN, CCM Care Management Coordinator [place] Management Direct Phone: [phone]

## 2022-09-12 NOTE — Patient Outreach (Signed)
  Care Coordination   Initial Visit Note   09/12/2022 Name: Jeffrey Mullen MRN: [id number] DOB: 1951-10-26  Jeffrey Mullen is a 71 y.o. year old male who sees [name], NP for primary care. I spoke with  [name] by phone today.  What matters to the patients health and wellness today?  Patient would like to improve his physical mobility.     Goals Addressed               This Visit's Progress     Patient Stated     I feel that I need outpatient PT and a cortizone injection (pt-stated)        Care Coordination Interventions: Evaluation of current treatment plan related to impaired physical mobility s/p CVA and patient's adherence to plan as established by provider Determined patient is receiving in home PT but feels he is not progressing following a recent stroke Discussed patient feels he will receive more benefits from outpatient PT verses in home Assessed for transportation needs, patient uses public transportation and medical transportation available via health plan  Advised patient to reschedule his PCP follow up visit ASAP in order to have mobility and leg swelling concerns evaluated Instructed patient to ask his provider for referral to outpatient PT due to feeling his rehabilitation is not progressing him towards improved mobility  Provided education to patient re: [facility] services including nurse care coordinator, SW and Pharmacy team         Other     COMPLETED: Having some issues with leg(swelling and pain)        Care Coordination Interventions: Advised patient to make PCP ASAP Provided education to patient re: [place] patient to discuss that he is interested in steroid injections with provider Assessed social determinant of health barriers          SDOH assessments and interventions completed:  Yes  SDOH Interventions Today    Flowsheet Row Most Recent Value  SDOH Interventions   Transportation Interventions Intervention  Not Indicated        Care Coordination Interventions Activated:  Yes  Care Coordination Interventions:  Yes, provided   Follow up plan: Follow up call scheduled for 10/10/22 '@10'$ :15 AM    Encounter Outcome:  Pt. Visit Completed

## 2022-09-13 MED ORDER — IBUPROFEN 800 MG PO TABS: ORAL_TABLET | ORAL | 0 refills | Status: DC

## 2022-09-13 NOTE — Telephone Encounter (Signed)
Requested medication (s) are due for refill today: yes  Requested medication (s) are on the active medication list: yes  Last refill:  07/31/22 #60/0  Future visit scheduled: yes 09/21/22  Notes to clinic:  pt meets protocol and has scheduled appt for 09/21/22. Please review if ok for refill prior to appt.      Requested Prescriptions  Pending Prescriptions Disp Refills   ibuprofen (ADVIL) 800 MG tablet 60 tablet 0    Sig: TAKE 1 TABLET (800 MG TOTAL) BY MOUTH EVERY EIGHT (EIGHT) HOURS AS NEEDED. ONLY TAKE WITH FOOD     Analgesics:  NSAIDS Failed - 09/12/2022  5:37 PM      Failed - Manual Review: Labs are only required if the patient has taken medication for more than 8 weeks.      Failed - HGB in normal range and within 360 days    Hemoglobin  Date Value Ref Range Status  06/18/2022 12.0 (L) 13.0 - 17.0 g/dL Final  04/04/2022 13.5 13.0 - 17.7 g/dL Final         Failed - HCT in normal range and within 360 days    HCT  Date Value Ref Range Status  06/18/2022 36.6 (L) 39.0 - 52.0 % Final   Hematocrit  Date Value Ref Range Status  04/04/2022 43.5 37.5 - 51.0 % Final         Passed - Cr in normal range and within 360 days    Creat  Date Value Ref Range Status  12/09/2014 1.01 0.50 - 1.35 mg/dL Final   Creatinine, Ser  Date Value Ref Range Status  06/18/2022 0.95 0.61 - 1.24 mg/dL Final   Creatinine, Urine  Date Value Ref Range Status  10/28/2021 193.33 mg/dL Final    Comment:    Performed at [facility], [place] [address] [place], [place] [address]         Passed - PLT in normal range and within 360 days    Platelets  Date Value Ref Range Status  06/18/2022 265 150 - 400 K/uL Final  04/04/2022 312 150 - 450 x10E3/uL Final         Passed - eGFR is 30 or above and within 360 days    GFR calc Af Amer  Date Value Ref Range Status  12/07/2020 80 >59 mL/min/1.73 Final    Comment:    **In accordance with recommendations from the NKF-ASN Task force,**   [facility]  is in the process of updating its eGFR calculation to the   2021 CKD-EPI creatinine equation that estimates kidney function   without a race variable.    GFR, Estimated  Date Value Ref Range Status  06/18/2022 >60 >60 mL/min Final    Comment:    (NOTE) Calculated using the CKD-EPI Creatinine Equation (2021)    eGFR  Date Value Ref Range Status  04/04/2022 78 >59 mL/min/1.73 Final         Passed - Patient is not pregnant      Passed - Valid encounter within last 12 months    Recent Outpatient Visits           5 months ago Chronic heel pain, left   [place], [place] W, NP   5 months ago Type 2 diabetes mellitus with hyperglycemia, unspecified whether long term insulin use [facility])   [place], [place]   1 year ago Benign essential HTN  [place] [name], NP   1 year ago Essential hypertension   [place], Zelda W, NP   1 year ago Squamous papilloma   [place] Jodi Marble, MD       Future Appointments             In 1 week [name], NP [place]

## 2022-09-21 ENCOUNTER — Telehealth: Payer: Medicare Other | Admitting: Nurse Practitioner

## 2022-09-21 ENCOUNTER — Ambulatory Visit: Payer: Self-pay

## 2022-09-21 NOTE — Telephone Encounter (Signed)
  Chief Complaint: leg pain Symptoms: bilat leg pain, 8/10, R worse than L  Frequency: ongoing for RLE but LLE started yesterday Pertinent Negatives: NA Disposition: '[]'$ ED /'[]'$ Urgent Care (no appt availability in office) / '[x]'$ Appointment(In office/virtual)/ '[]'$  [place] [facility]/ '[]'$ Home Care/ '[]'$ Refused Recommended Disposition /'[]'$ [facility]/ '[]'$  Follow-up with PCP Additional Notes: pt was calling to cancel appt today at 210 with Zelda, NP d/t unable to walk very far d/t leg pain. Pt's CVA in Nov 2022 caused ongoing leg pain in RLE but now LLE has started. Advised pt if he canceled next appt wasn't until Nov. I was able to change pt's appt to a telephone visit and advised to be near his phone around appt time. Pt verbalized understanding.   Reason for Disposition  [1] SEVERE pain (e.g., excruciating, unable to do any normal activities) AND [2] not improved after 2 hours of pain medicine  Answer Assessment - Initial Assessment Questions 1. ONSET: "When did the pain start?"      Several months  2. LOCATION: "Where is the pain located?"      Both legs but L leg just starting yesterday 3. PAIN: "How bad is the pain?"    (Scale 1-10; or mild, moderate, severe)   -  MILD (1-3): doesn't interfere with normal activities    -  MODERATE (4-7): interferes with normal activities (e.g., work or school) or awakens from sleep, limping    -  SEVERE (8-10): excruciating pain, unable to do any normal activities, unable to walk     8/10, R leg ongoing but L leg new problem  Protocols used: Leg Pain-A-AH

## 2022-10-01 ENCOUNTER — Other Ambulatory Visit: Payer: Self-pay | Admitting: Physician Assistant

## 2022-10-01 DIAGNOSIS — F419 Anxiety disorder, unspecified: Secondary | ICD-10-CM

## 2022-10-01 DIAGNOSIS — I10 Essential (primary) hypertension: Secondary | ICD-10-CM

## 2022-10-10 ENCOUNTER — Ambulatory Visit: Payer: Self-pay

## 2022-10-10 NOTE — Patient Instructions (Signed)
Visit Information  Thank you for taking time to visit with me today. Please don't hesitate to contact me if I can be of assistance to you.   Following are the goals we discussed today:   Goals Addressed               This Visit's Progress     Patient Stated     I feel that I need outpatient PT and a cortizone injection (pt-stated)        Care Coordination Interventions: Evaluation of current treatment plan related to impaired physical mobility s/p CVA and patient's adherence to plan as established by provider Determined patient completed in home PT, he reports no change in his mobility at this time Determined patient cancelled his PCP follow up due to having more pain on the day he was due to f/u with PCP Determined patient prefers a face to face visit and plans to reschedule this PCP f/u in the near future to discuss his pain and impaired physical mobility Encouraged patient to continue to follow his HEP as directed by PT and to reschedule his PCP office visit ASAP         Our next appointment is by telephone on 12/10/22 at 11:00 AM  Please call the care guide team at [phone] if you need to cancel or reschedule your appointment.   If you are experiencing a Mental Health or [place] or need someone to talk to, please call 1-800-273-TALK (toll free, 24 hour hotline) go to [facility] [address], [place] [phone])  Patient verbalizes understanding of instructions and care plan provided today and agrees to view in [place]. Active MyChart status and patient understanding of how to access instructions and care plan via MyChart confirmed with patient.     Barb Merino, RN, BSN, CCM Care Management Coordinator [facility] Care Management Direct Phone: [phone]

## 2022-10-10 NOTE — Patient Outreach (Signed)
  Care Coordination   Follow Up Visit Note   10/10/2022 Name: STOKELY JEANCHARLES MRN: [id number] DOB: [birth year]  FORDYCE LEPAK is a 71 y.o. year old male who sees [name], NP for primary care. I spoke with  [name] by phone today.  What matters to the patients health and wellness today?  Patient will call his PCP to reschedule his office visit for evaluation of persistent pain and impaired physical mobility.     Goals Addressed               This Visit's Progress     Patient Stated     I feel that I need outpatient PT and a cortizone injection (pt-stated)        Care Coordination Interventions: Evaluation of current treatment plan related to impaired physical mobility s/p CVA and patient's adherence to plan as established by provider Determined patient completed in home PT, he reports no change in his mobility at this time Determined patient cancelled his PCP follow up due to having more pain on the day he was due to f/u with PCP Determined patient prefers a face to face visit and plans to reschedule this PCP f/u in the near future to discuss his pain and impaired physical mobility Encouraged patient to continue to follow his HEP as directed by PT and to reschedule his PCP office visit ASAP         SDOH assessments and interventions completed:  Yes  SDOH Interventions Today    Flowsheet Row Most Recent Value  SDOH Interventions   Transportation Interventions Intervention Not Indicated        Care Coordination Interventions Activated:  Yes  Care Coordination Interventions:  Yes, provided   Follow up plan: Follow up call scheduled for 12/10/22 '@11'$ :00 AM    Encounter Outcome:  Pt. Visit Completed

## 2022-11-06 ENCOUNTER — Other Ambulatory Visit: Payer: Self-pay | Admitting: Physician Assistant

## 2022-11-06 DIAGNOSIS — F419 Anxiety disorder, unspecified: Secondary | ICD-10-CM

## 2022-11-09 ENCOUNTER — Other Ambulatory Visit: Payer: Self-pay | Admitting: Family Medicine

## 2022-11-09 ENCOUNTER — Other Ambulatory Visit: Payer: Self-pay | Admitting: Physician Assistant

## 2022-11-09 DIAGNOSIS — I10 Essential (primary) hypertension: Secondary | ICD-10-CM

## 2022-11-09 DIAGNOSIS — G8929 Other chronic pain: Secondary | ICD-10-CM

## 2022-11-21 ENCOUNTER — Other Ambulatory Visit: Payer: Self-pay | Admitting: Physician Assistant

## 2022-11-21 ENCOUNTER — Other Ambulatory Visit: Payer: Self-pay | Admitting: Family Medicine

## 2022-11-21 DIAGNOSIS — I10 Essential (primary) hypertension: Secondary | ICD-10-CM

## 2022-11-21 DIAGNOSIS — F419 Anxiety disorder, unspecified: Secondary | ICD-10-CM

## 2022-11-21 DIAGNOSIS — G8929 Other chronic pain: Secondary | ICD-10-CM

## 2022-12-10 ENCOUNTER — Ambulatory Visit: Payer: Self-pay

## 2022-12-10 NOTE — Patient Outreach (Signed)
  Care Coordination   12/10/2022 Name: Jeffrey Mullen MRN: [id number] DOB: 02-16-1951   Care Coordination Outreach Attempts:  An unsuccessful telephone outreach was attempted for a scheduled appointment today.  Follow Up Plan:  Additional outreach attempts will be made to offer the patient care coordination information and services.   Encounter Outcome:  No Answer   Care Coordination Interventions:  No, not indicated    Barb Merino, RN, BSN, CCM Care Management Coordinator [facility] Care Management Direct Phone: [phone]

## 2022-12-27 NOTE — Progress Notes (Signed)
Rescheduled 01/09/23  [place]  Direct Dial: [phone]

## 2023-01-09 ENCOUNTER — Ambulatory Visit: Payer: Self-pay

## 2023-01-09 NOTE — Patient Outreach (Signed)
  Care Coordination   01/09/2023 Name: MERVILLE HIJAZI MRN: [id number] DOB: 1951-04-06   Care Coordination Outreach Attempts:  An unsuccessful telephone outreach was attempted for a scheduled appointment today.  Follow Up Plan:  Additional outreach attempts will be made to offer the patient care coordination information and services.   Encounter Outcome:  No Answer   Care Coordination Interventions:  No, not indicated    Barb Merino, RN, BSN, CCM Care Management Coordinator [facility] Care Management  Direct Phone: [phone]

## 2023-01-23 NOTE — Progress Notes (Signed)
Rescheduled 02/05/23 at 2:30 PM   [place]: [phone]

## 2023-02-05 ENCOUNTER — Ambulatory Visit: Payer: Self-pay

## 2023-02-05 NOTE — Patient Outreach (Signed)
  Care Coordination   02/05/2023 Name: ANOOP HEMMER MRN: [id number] DOB: [birth year]   Care Coordination Outreach Attempts:  An unsuccessful telephone outreach was attempted for a scheduled appointment today.  Follow Up Plan:  No further outreach attempts will be made at this time. We have been unable to contact the patient to offer or enroll patient in care coordination services  Encounter Outcome:  No Answer   Care Coordination Interventions:  No, not indicated    Barb Merino, RN, BSN, CCM Care Management Coordinator [place] Management  Direct Phone: [phone]

## 2023-02-25 ENCOUNTER — Telehealth: Payer: Self-pay | Admitting: Nurse Practitioner

## 2023-02-25 NOTE — Telephone Encounter (Signed)
Called patient to schedule Medicare Annual Wellness Visit (AWV). Left message for patient to call back and schedule Medicare Annual Wellness Visit (AWV).  Last date of AWV:  due awvi 11/24/19 per [place]    If any questions, please contact me at [phone].  Thank you ,  [place] Boards AWV direct phone # [phone]

## 2023-03-10 ENCOUNTER — Emergency Department (HOSPITAL_COMMUNITY)
Admission: EM | Admit: 2023-03-10 | Discharge: 2023-03-10 | Disposition: A | Discharge status: Home / Self Care | Payer: 59 | Attending: Emergency Medicine | Admitting: Emergency Medicine

## 2023-03-10 DIAGNOSIS — R569 Unspecified convulsions: Secondary | ICD-10-CM | POA: Insufficient documentation

## 2023-03-10 DIAGNOSIS — Z743 Need for continuous supervision: Secondary | ICD-10-CM | POA: Diagnosis not present

## 2023-03-10 DIAGNOSIS — Z7982 Long term (current) use of aspirin: Secondary | ICD-10-CM | POA: Diagnosis not present

## 2023-03-10 DIAGNOSIS — R6889 Other general symptoms and signs: Secondary | ICD-10-CM | POA: Diagnosis not present

## 2023-03-10 DIAGNOSIS — R404 Transient alteration of awareness: Secondary | ICD-10-CM | POA: Diagnosis not present

## 2023-03-10 DIAGNOSIS — I499 Cardiac arrhythmia, unspecified: Secondary | ICD-10-CM | POA: Diagnosis not present

## 2023-03-10 DIAGNOSIS — R000 Tachycardia, unspecified: Secondary | ICD-10-CM | POA: Diagnosis not present

## 2023-03-10 LAB — CBC WITH DIFFERENTIAL/PLATELET
Abs Immature Granulocytes: 0.1 10*3/uL — ABNORMAL HIGH (ref 0.00–0.07)
Basophils Absolute: 0 10*3/uL (ref 0.0–0.1)
Basophils Relative: 1 %
Eosinophils Absolute: 0.1 10*3/uL (ref 0.0–0.5)
Eosinophils Relative: 1 %
HCT: 44 % (ref 39.0–52.0)
Hemoglobin: 14.1 g/dL (ref 13.0–17.0)
Immature Granulocytes: 1 %
Lymphocytes Relative: 46 %
Lymphs Abs: 3.5 10*3/uL (ref 0.7–4.0)
MCH: 28.1 pg (ref 26.0–34.0)
MCHC: 32 g/dL (ref 30.0–36.0)
MCV: 87.6 fL (ref 80.0–100.0)
Monocytes Absolute: 0.6 10*3/uL (ref 0.1–1.0)
Monocytes Relative: 8 %
Neutro Abs: 3.3 10*3/uL (ref 1.7–7.7)
Neutrophils Relative %: 43 %
Platelets: 278 10*3/uL (ref 150–400)
RBC: 5.02 MIL/uL (ref 4.22–5.81)
RDW: 14.7 % (ref 11.5–15.5)
WBC: 7.6 10*3/uL (ref 4.0–10.5)
nRBC: 0 % (ref 0.0–0.2)

## 2023-03-10 LAB — COMPREHENSIVE METABOLIC PANEL
ALT: 27 U/L (ref 0–44)
AST: 39 U/L (ref 15–41)
Albumin: 3.9 g/dL (ref 3.5–5.0)
Alkaline Phosphatase: 91 U/L (ref 38–126)
Anion gap: 19 — ABNORMAL HIGH (ref 5–15)
BUN: 9 mg/dL (ref 8–23)
CO2: 20 mmol/L — ABNORMAL LOW (ref 22–32)
Calcium: 9 mg/dL (ref 8.9–10.3)
Chloride: 102 mmol/L (ref 98–111)
Creatinine, Ser: 1.29 mg/dL — ABNORMAL HIGH (ref 0.61–1.24)
GFR, Estimated: >60 mL/min (ref >60)
Glucose, Bld: 173 mg/dL — ABNORMAL HIGH (ref 70–99)
Potassium: 4.4 mmol/L (ref 3.5–5.1)
Sodium: 141 mmol/L (ref 135–145)
Total Bilirubin: 0.3 mg/dL (ref 0.3–1.2)
Total Protein: 7.5 g/dL (ref 6.5–8.1)

## 2023-03-10 LAB — LACTIC ACID, PLASMA
Lactic Acid, Venous: >9 mmol/L (ref 0.5–1.9)
Lactic Acid, Venous: 3.2 mmol/L (ref 0.5–1.9)
Lactic Acid, Venous: 2.1 mmol/L (ref 0.5–1.9)

## 2023-03-10 LAB — ETHANOL: Alcohol, Ethyl (B): <10 mg/dL (ref <10)

## 2023-03-10 LAB — CBG MONITORING, ED: Glucose-Capillary: 196 mg/dL — ABNORMAL HIGH (ref 70–99)

## 2023-03-10 MED ORDER — SODIUM CHLORIDE 0.9 % IV BOLUS
1000.0000 mL | Freq: Once | INTRAVENOUS | Status: AC
  Administered 2023-03-10: 1000 mL via INTRAVENOUS

## 2023-03-10 MED ORDER — KETOROLAC TROMETHAMINE 15 MG/ML IJ SOLN
15.0000 mg | Freq: Once | INTRAMUSCULAR | Status: AC
  Administered 2023-03-10: 15 mg via INTRAVENOUS
  Filled 2023-03-10: qty 1

## 2023-03-10 MED ORDER — LEVETIRACETAM IN NACL 1000 MG/100ML IV SOLN
1000.0000 mg | Freq: Two times a day (BID) | INTRAVENOUS | Status: DC
  Administered 2023-03-10: 1000 mg via INTRAVENOUS
  Filled 2023-03-10: qty 100

## 2023-03-10 NOTE — ED Triage Notes (Signed)
10-73min seizure episode at home. Pt has a hx of seizure and on keppra but has not been compliant with meds. O2 90% on RA, CBG 130, bp was 220/100.

## 2023-03-10 NOTE — Discharge Instructions (Signed)
It is very important to take your seizure medication as instructed.

## 2023-03-10 NOTE — ED Provider Notes (Signed)
[facility] Provider Note   CSN: [id number] Arrival date & time: 03/10/23  1532     History  Chief Complaint  Patient presents with   Seizures    Jeffrey Mullen is a 72 y.o. male.  HPI Patient presents after witnessed seizure.  He arrives via EMS. According to bystanders the patient has not been compliant with medications, was found to have 10-15 minutes of seizure-like activity on a bed, no trauma. EMS reports the patient was postictal, hypertensive, tachycardic, borderline hypoxic on arrival. The patient himself did not provide any depth of today's event, with minimal stimuli does answer questions briefly, denies pain, discomfort, dyspnea. Level 5 caveat secondary to acuity of condition.  Patient with multiple medical issues and    Home Medications Prior to Admission medications   Medication Sig Start Date End Date Taking? Authorizing Provider  amLODipine (NORVASC) 10 MG tablet TAKE ONE TABLET BY MOUTH DAILY 10/01/22   Charlott Rakes, MD  aspirin 81 MG chewable tablet Chew 1 tablet (81 mg total) by mouth daily. 12/15/21   Samella Parr, NP  atropine 1 % ophthalmic solution Place 1 drop into the left eye daily.    [provider]  busPIRone (BUSPAR) 15 MG tablet TAKE ONE TABLET BY MOUTH THREE TIMES A DAY 10/01/22   Charlott Rakes, MD  carvedilol (COREG) 12.5 MG tablet Take 1 tablet (12.5 mg total) by mouth 2 (two) times daily with a meal. Patient not taking: Reported on 06/18/2022 04/04/22   Argentina Donovan, PA-C  cholecalciferol (VITAMIN D3) 25 MCG (1000 UNIT) tablet Take 1,000 Units by mouth daily.    [provider]  ibuprofen (ADVIL) 800 MG tablet TAKE 1 TABLET (800 MG TOTAL) BY MOUTH EVERY EIGHT (EIGHT) HOURS AS NEEDED. ONLY TAKE WITH FOOD 09/13/22   Charlott Rakes, MD  levETIRAcetam (KEPPRA) 100 MG/ML solution Take 5 mLs (500 mg total) by mouth 2 (two) times daily. Patient taking differently: Take 500 mg  by mouth 2 (two) times daily as needed (seizure). 04/04/22   McClung, Dionne Bucy, PA-C  LUMIGAN 0.01 % SOLN Place 1 drop into the right eye at bedtime. 06/01/22   [provider]  pantoprazole (PROTONIX) 40 MG tablet Take 40 mg by mouth daily.    [provider]  SANTYL 250 UNIT/GM ointment Apply 1 Application topically daily. 06/01/22   [provider]  Thiamine HCl (VITAMIN B-1) 250 MG tablet Take 250 mg by mouth daily.    [provider]  vitamin B-12 (CYANOCOBALAMIN) 100 MCG tablet Take 100 mcg by mouth daily.    [provider]      Allergies    Tylenol [acetaminophen]    Review of Systems   Review of Systems  Unable to perform ROS: Acuity of condition    Physical Exam Updated Vital Signs BP (!) 143/75   Pulse 66   Temp 98.5 F (36.9 C) (Oral)   Resp 13   SpO2 98%  Physical Exam Vitals and nursing note reviewed.  Constitutional:      General: He is not in acute distress.    Appearance: He is well-developed.     Comments: Unwell appearing adult male withdrawn, but responds to straightforward questions minimal stimuli.  HENT:     Head: Normocephalic and atraumatic.  Eyes:     Conjunctiva/sclera: Conjunctivae normal.  Cardiovascular:     Rate and Rhythm: Regular rhythm. Tachycardia present.  Pulmonary:     Effort: Pulmonary  effort is normal. No respiratory distress.     Breath sounds: No stridor.  Abdominal:     General: There is no distension.  Skin:    General: Skin is warm and dry.  Neurological:     Mental Status: He is oriented to person, place, and time.     Comments: Patient moves all extremities spontaneously, answers questions appropriately, briefly after minimal stimuli.  On arrival patient has findings consistent with postictal phase.  Psychiatric:     Comments: Withdrawn     ED Results / Procedures / Treatments   Labs (all labs ordered are listed, but only abnormal results are displayed) Labs Reviewed   COMPREHENSIVE METABOLIC PANEL - Abnormal; Notable for the following components:      Result Value   CO2 20 (*)    Glucose, Bld 173 (*)    Creatinine, Ser 1.29 (*)    Anion gap 19 (*)    All other components within normal limits  CBC WITH DIFFERENTIAL/PLATELET - Abnormal; Notable for the following components:   Abs Immature Granulocytes 0.10 (*)    All other components within normal limits  LACTIC ACID, PLASMA - Abnormal; Notable for the following components:   Lactic Acid, Venous >9.0 (*)    All other components within normal limits  LACTIC ACID, PLASMA - Abnormal; Notable for the following components:   Lactic Acid, Venous 3.2 (*)    All other components within normal limits  CBG MONITORING, ED - Abnormal; Notable for the following components:   Glucose-Capillary 196 (*)    All other components within normal limits  ETHANOL  LACTIC ACID, PLASMA  LACTIC ACID, PLASMA    EKG EKG Interpretation  Date/Time:  Sunday March 10 2023 15:45:39 EDT Ventricular Rate:  135 PR Interval:  163 QRS Duration: 77 QT Interval:  291 QTC Calculation: 437 R Axis:   57 Text Interpretation: Sinus tachycardia Probable left atrial enlargement Anteroseptal infarct, old Abnormal ECG Confirmed by Carmin Muskrat [phone]) on 03/10/2023 4:15:07 PM  Radiology No results found.  Procedures Procedures    Medications Ordered in ED Medications  levETIRAcetam (KEPPRA) IVPB 1000 mg/100 mL premix (0 mg Intravenous Stopped 03/10/23 1640)  sodium chloride 0.9 % bolus 1,000 mL (0 mLs Intravenous Stopped 03/10/23 1744)  ketorolac (TORADOL) 15 MG/ML injection 15 mg (15 mg Intravenous Given 03/10/23 1643)  sodium chloride 0.9 % bolus 1,000 mL (0 mLs Intravenous Stopped 03/10/23 2214)    ED Course/ Medical Decision Making/ A&P                             Medical Decision Making Alcohol abuse, tobacco abuse, cirrhosis, seizures presents after witnessed seizure. Concern for medication noncompliance, versus  progression of disease versus other etiology such as intoxication or electrolyte abnormalities. Patient received loading dose of Keppra, fluids, was on continuous cardiac monitoring, labs sent.  Cardiac 125 sinus tach abnormal Pulse ox 97% 2 L nasal cannula abnormal   Amount and/or Complexity of Data Reviewed Independent Historian: EMS External Data Reviewed: notes.    Details: History as above Labs: ordered. Decision-making details documented in ED Course. ECG/medicine tests: ordered and independent interpretation performed. Decision-making details documented in ED Course.  Risk Prescription drug management.   10:46 PM Patient awake, alert, has had a sandwich, is in no distress, is hemodynamically unremarkable.  His initial lactic acidosis has improved markedly, and he has received fluids, Keppra loading, has had no additional seizure-like activity. No evidence  for trauma, and the patient's reported history of medication noncompliance is suspicious etiology for today's episodes.  Given his improvement here, absence of ongoing seizure activity, absence of other evidence for concurrent infection or other phenomenon patient discharged in stable condition.         Final Clinical Impression(s) / ED Diagnoses Final diagnoses:  Seizure [facility])    Rx / [place] Orders ED Discharge Orders     None         Carmin Muskrat, MD 03/10/23 2246

## 2023-03-10 NOTE — ED Notes (Signed)
O2 at 83% on RA. O2 at 4LPM initiated [place]. O2 at 98% on current settings.

## 2023-09-09 ENCOUNTER — Emergency Department (HOSPITAL_COMMUNITY): Payer: 59

## 2023-09-09 ENCOUNTER — Emergency Department (HOSPITAL_COMMUNITY)
Admission: EM | Admit: 2023-09-09 | Discharge: 2023-09-09 | Disposition: A | Discharge status: Home / Self Care | Payer: 59 | Attending: Emergency Medicine | Admitting: Emergency Medicine

## 2023-09-09 DIAGNOSIS — Z8673 Personal history of transient ischemic attack (TIA), and cerebral infarction without residual deficits: Secondary | ICD-10-CM | POA: Diagnosis not present

## 2023-09-09 DIAGNOSIS — F1410 Cocaine abuse, uncomplicated: Secondary | ICD-10-CM | POA: Insufficient documentation

## 2023-09-09 DIAGNOSIS — Z7982 Long term (current) use of aspirin: Secondary | ICD-10-CM | POA: Diagnosis not present

## 2023-09-09 DIAGNOSIS — R0989 Other specified symptoms and signs involving the circulatory and respiratory systems: Secondary | ICD-10-CM | POA: Diagnosis not present

## 2023-09-09 DIAGNOSIS — R6889 Other general symptoms and signs: Secondary | ICD-10-CM | POA: Diagnosis not present

## 2023-09-09 DIAGNOSIS — I6523 Occlusion and stenosis of bilateral carotid arteries: Secondary | ICD-10-CM | POA: Diagnosis not present

## 2023-09-09 DIAGNOSIS — I10 Essential (primary) hypertension: Secondary | ICD-10-CM | POA: Diagnosis not present

## 2023-09-09 DIAGNOSIS — R569 Unspecified convulsions: Secondary | ICD-10-CM | POA: Diagnosis not present

## 2023-09-09 DIAGNOSIS — R410 Disorientation, unspecified: Secondary | ICD-10-CM | POA: Diagnosis not present

## 2023-09-09 DIAGNOSIS — S199XXA Unspecified injury of neck, initial encounter: Secondary | ICD-10-CM | POA: Diagnosis not present

## 2023-09-09 DIAGNOSIS — Z743 Need for continuous supervision: Secondary | ICD-10-CM | POA: Diagnosis not present

## 2023-09-09 DIAGNOSIS — W19XXXA Unspecified fall, initial encounter: Secondary | ICD-10-CM | POA: Diagnosis not present

## 2023-09-09 DIAGNOSIS — S0990XA Unspecified injury of head, initial encounter: Secondary | ICD-10-CM | POA: Diagnosis not present

## 2023-09-09 DIAGNOSIS — R0602 Shortness of breath: Secondary | ICD-10-CM | POA: Diagnosis not present

## 2023-09-09 LAB — URINALYSIS, W/ REFLEX TO CULTURE (INFECTION SUSPECTED)
Bacteria, UA: NONE SEEN
Bilirubin Urine: NEGATIVE
Glucose, UA: NEGATIVE mg/dL
Ketones, ur: NEGATIVE mg/dL
Leukocytes,Ua: NEGATIVE
Nitrite: NEGATIVE
Protein, ur: 30 mg/dL — AB
Specific Gravity, Urine: 1.012 (ref 1.005–1.030)
pH: 5 (ref 5.0–8.0)

## 2023-09-09 LAB — I-STAT CHEM 8, ED
BUN: 6 mg/dL — ABNORMAL LOW (ref 8–23)
Calcium, Ion: 1.13 mmol/L — ABNORMAL LOW (ref 1.15–1.40)
Chloride: 105 mmol/L (ref 98–111)
Creatinine, Ser: 1.1 mg/dL (ref 0.61–1.24)
Glucose, Bld: 133 mg/dL — ABNORMAL HIGH (ref 70–99)
HCT: 46 % (ref 39.0–52.0)
Hemoglobin: 15.6 g/dL (ref 13.0–17.0)
Potassium: 4 mmol/L (ref 3.5–5.1)
Sodium: 143 mmol/L (ref 135–145)
TCO2: 24 mmol/L (ref 22–32)

## 2023-09-09 LAB — TROPONIN I (HIGH SENSITIVITY)
Troponin I (High Sensitivity): 22 ng/L — ABNORMAL HIGH (ref <18)
Troponin I (High Sensitivity): 28 ng/L — ABNORMAL HIGH (ref <18)

## 2023-09-09 LAB — COMPREHENSIVE METABOLIC PANEL
ALT: 39 U/L (ref 0–44)
AST: 38 U/L (ref 15–41)
Albumin: 4 g/dL (ref 3.5–5.0)
Alkaline Phosphatase: 108 U/L (ref 38–126)
Anion gap: 12 (ref 5–15)
BUN: 6 mg/dL — ABNORMAL LOW (ref 8–23)
CO2: 25 mmol/L (ref 22–32)
Calcium: 8.9 mg/dL (ref 8.9–10.3)
Chloride: 106 mmol/L (ref 98–111)
Creatinine, Ser: 1.21 mg/dL (ref 0.61–1.24)
GFR, Estimated: >60 mL/min (ref >60)
Glucose, Bld: 137 mg/dL — ABNORMAL HIGH (ref 70–99)
Potassium: 4 mmol/L (ref 3.5–5.1)
Sodium: 143 mmol/L (ref 135–145)
Total Bilirubin: 0.5 mg/dL (ref 0.3–1.2)
Total Protein: 7.9 g/dL (ref 6.5–8.1)

## 2023-09-09 LAB — CBC WITH DIFFERENTIAL/PLATELET
Abs Immature Granulocytes: 0.04 10*3/uL (ref 0.00–0.07)
Basophils Absolute: 0 10*3/uL (ref 0.0–0.1)
Basophils Relative: 0 %
Eosinophils Absolute: 0 10*3/uL (ref 0.0–0.5)
Eosinophils Relative: 0 %
HCT: 43 % (ref 39.0–52.0)
Hemoglobin: 14 g/dL (ref 13.0–17.0)
Immature Granulocytes: 1 %
Lymphocytes Relative: 10 %
Lymphs Abs: 0.5 10*3/uL — ABNORMAL LOW (ref 0.7–4.0)
MCH: 27.8 pg (ref 26.0–34.0)
MCHC: 32.6 g/dL (ref 30.0–36.0)
MCV: 85.3 fL (ref 80.0–100.0)
Monocytes Absolute: 0.4 10*3/uL (ref 0.1–1.0)
Monocytes Relative: 7 %
Neutro Abs: 4.5 10*3/uL (ref 1.7–7.7)
Neutrophils Relative %: 82 %
Platelets: 217 10*3/uL (ref 150–400)
RBC: 5.04 MIL/uL (ref 4.22–5.81)
RDW: 14.5 % (ref 11.5–15.5)
WBC: 5.4 10*3/uL (ref 4.0–10.5)
nRBC: 0 % (ref 0.0–0.2)

## 2023-09-09 LAB — RAPID URINE DRUG SCREEN, HOSP PERFORMED
Amphetamines: NOT DETECTED
Barbiturates: NOT DETECTED
Benzodiazepines: NOT DETECTED
Cocaine: POSITIVE — AB
Opiates: NOT DETECTED
Tetrahydrocannabinol: NOT DETECTED

## 2023-09-09 LAB — PROTIME-INR
INR: 1.1 (ref 0.8–1.2)
Prothrombin Time: 14 s (ref 11.4–15.2)

## 2023-09-09 LAB — ETHANOL: Alcohol, Ethyl (B): <10 mg/dL (ref <10)

## 2023-09-09 MED ORDER — LORAZEPAM 2 MG/ML IJ SOLN: 1.0000 mg | Freq: Once | INTRAMUSCULAR | Status: AC

## 2023-09-09 MED ORDER — LEVETIRACETAM IN NACL 1500 MG/100ML IV SOLN
1500.0000 mg | Freq: Once | INTRAVENOUS | Status: AC
  Administered 2023-09-09: 1500 mg via INTRAVENOUS
  Filled 2023-09-09: qty 100

## 2023-09-09 MED ORDER — IBUPROFEN 400 MG PO TABS
600.0000 mg | ORAL_TABLET | Freq: Once | ORAL | Status: AC
  Administered 2023-09-09: 600 mg via ORAL
  Filled 2023-09-09: qty 1

## 2023-09-09 MED ORDER — LORAZEPAM 2 MG/ML IJ SOLN
INTRAMUSCULAR | Status: AC
  Administered 2023-09-09: 1 mg via INTRAVENOUS
  Filled 2023-09-09: qty 1

## 2023-09-09 NOTE — Discharge Instructions (Addendum)
  Return for any problem.  Please make sure you take your antiseizure medications as previously prescribed.  Do not use cocaine.

## 2023-09-09 NOTE — ED Notes (Signed)
PT back from Ct.

## 2023-09-09 NOTE — ED Notes (Signed)
Pt given snack bag and drink.

## 2023-09-09 NOTE — ED Notes (Signed)
EM provider notified of BP.

## 2023-09-09 NOTE — ED Notes (Signed)
EM provider at bedside.

## 2023-09-09 NOTE — ED Triage Notes (Addendum)
Pt BIB EMS from home after neighbor heard a loud thud/ him falling. Fire found pt in bathroom on ground. He is on thinners, last seizure in March. 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 Pt is A&Ox4 with mumbled speech and reports headache since ambulance ride. Pt endorses regular EtOH consumption. Last cocaine use was 6 months ago.

## 2023-09-09 NOTE — ED Notes (Addendum)
This RN reviewed discharge instructions with patient. He verbalized understanding and denied any further questions. Pt insisting he had shoes upon arrival, though EMS did not drop shoes off. This RN spoke with [facility] about missing shoes. None were found on rig. Pt wheeled to exit and provided with bus pass to get home.

## 2023-09-09 NOTE — ED Provider Notes (Signed)
[place] EMERGENCY DEPARTMENT AT [facility] Provider Note   CSN: [id number] Arrival date & time: 09/09/23  0741     History  Chief Complaint  Patient presents with   Fall    thinners    Jeffrey Mullen is a 72 y.o. male.  72 year old male with prior medical history as detailed below presents for evaluation. Patient arrives from home with EMS transport. Patient's neighbor apparently heard the patient fall (through the wall). On arrival of EMS, the patient was found in suspected post-ictal state. Mentation improved during transport.  Patient reportedly is compliant with previously prescribed anti- seizure medication - Patient cannot recall taking his Keppra today. Patient denies any specific complaint on arrival. Patient denies any recent cocaine use.  The history is provided by the patient, the EMS personnel and medical records.  Fall       Home Medications Prior to Admission medications   Medication Sig Start Date End Date Taking? Authorizing Provider  amLODipine (NORVASC) 10 MG tablet TAKE ONE TABLET BY MOUTH DAILY 10/01/22   Hoy Register, MD  aspirin 81 MG chewable tablet Chew 1 tablet (81 mg total) by mouth daily. 12/15/21   Russella Dar, NP  atropine 1 % ophthalmic solution Place 1 drop into the left eye daily.    [provider]  busPIRone (BUSPAR) 15 MG tablet TAKE ONE TABLET BY MOUTH THREE TIMES A DAY 10/01/22   Hoy Register, MD  carvedilol (COREG) 12.5 MG tablet Take 1 tablet (12.5 mg total) by mouth 2 (two) times daily with a meal. Patient not taking: Reported on 06/18/2022 04/04/22   Anders Simmonds, PA-C  cholecalciferol (VITAMIN D3) 25 MCG (1000 UNIT) tablet Take 1,000 Units by mouth daily.    [provider]  ibuprofen (ADVIL) 800 MG tablet TAKE 1 TABLET (800 MG TOTAL) BY MOUTH EVERY EIGHT (EIGHT) HOURS AS NEEDED. ONLY TAKE WITH FOOD 09/13/22   Hoy Register, MD  levETIRAcetam (KEPPRA) 100 MG/ML solution Take 5 mLs (500 mg  total) by mouth 2 (two) times daily. Patient taking differently: Take 500 mg by mouth 2 (two) times daily as needed (seizure). 04/04/22   McClung, Marzella Schlein, PA-C  LUMIGAN 0.01 % SOLN Place 1 drop into the right eye at bedtime. 06/01/22   [provider]  pantoprazole (PROTONIX) 40 MG tablet Take 40 mg by mouth daily.    [provider]  SANTYL 250 UNIT/GM ointment Apply 1 Application topically daily. 06/01/22   [provider]  Thiamine HCl (VITAMIN B-1) 250 MG tablet Take 250 mg by mouth daily.    [provider]  vitamin B-12 (CYANOCOBALAMIN) 100 MCG tablet Take 100 mcg by mouth daily.    [provider]      Allergies    Tylenol [acetaminophen]    Review of Systems   Review of Systems  All other systems reviewed and are negative.   Physical Exam Updated Vital Signs Pulse 81   Temp 99 F (37.2 C) (Oral)   Resp 15   Ht 5\' 10"  (1.778 m)   Wt 81.6 kg   SpO2 96%   BMI 25.83 kg/m  Physical Exam Vitals and nursing note reviewed.  Constitutional:      General: He is not in acute distress.    Appearance: Normal appearance. He is well-developed.  HENT:     Head: Normocephalic and atraumatic.  Eyes:     Conjunctiva/sclera: Conjunctivae normal.     Pupils: Pupils are equal, round, and  reactive to light.     Comments: Left eye with chronic clouding/scarring noted  Cardiovascular:     Rate and Rhythm: Normal rate and regular rhythm.     Heart sounds: Normal heart sounds.  Pulmonary:     Effort: Pulmonary effort is normal. No respiratory distress.     Breath sounds: Normal breath sounds.  Abdominal:     General: There is no distension.     Palpations: Abdomen is soft.     Tenderness: There is no abdominal tenderness.  Musculoskeletal:        General: No deformity. Normal range of motion.     Cervical back: Normal range of motion and neck supple.  Skin:    General: Skin is warm and dry.  Neurological:     General: No focal deficit  present.     Mental Status: He is alert and oriented to person, place, and time. Mental status is at baseline.     Cranial Nerves: No cranial nerve deficit.     Sensory: No sensory deficit.     Motor: No weakness.     Coordination: Coordination normal.     ED Results / Procedures / Treatments   Labs (all labs ordered are listed, but only abnormal results are displayed) Labs Reviewed  CBC WITH DIFFERENTIAL/PLATELET - Abnormal; Notable for the following components:      Result Value   Lymphs Abs 0.5 (*)    All other components within normal limits  I-STAT CHEM 8, ED - Abnormal; Notable for the following components:   BUN 6 (*)    Glucose, Bld 133 (*)    Calcium, Ion 1.13 (*)    All other components within normal limits  PROTIME-INR  ETHANOL  COMPREHENSIVE METABOLIC PANEL  RAPID URINE DRUG SCREEN, HOSP PERFORMED  URINALYSIS, W/ REFLEX TO CULTURE (INFECTION SUSPECTED)  LEVETIRACETAM LEVEL  TROPONIN I (HIGH SENSITIVITY)    EKG EKG Interpretation Date/Time:  Monday September 09 2023 07:52:09 EDT Ventricular Rate:  88 PR Interval:  179 QRS Duration:  76 QT Interval:  397 QTC Calculation: 481 R Axis:   48  Text Interpretation: Sinus rhythm Probable left atrial enlargement Abnormal T, consider ischemia, lateral leads Confirmed by Kristine Royal [phone]) on 09/09/2023 7:58:26 AM  Radiology DG Chest Port 1 View  Result Date: 09/09/2023 CLINICAL DATA:  Shortness of breath. EXAM: PORTABLE CHEST 1 VIEW COMPARISON:  12/06/2021. FINDINGS: Low lung volume. Bilateral lung fields are clear. Bilateral costophrenic angles are clear. Normal cardio-mediastinal silhouette. No acute osseous abnormalities. The soft tissues are within normal limits. IMPRESSION: No active disease. Electronically Signed   By: Jules Schick M.D.   On: 09/09/2023 08:16    Procedures Procedures    Medications Ordered in ED Medications  LORazepam (ATIVAN) injection 1 mg (1 mg Intravenous Given 09/09/23 0755)   levETIRAcetam (KEPPRA) IVPB 1500 mg/ 100 mL premix (0 mg Intravenous Stopped 09/09/23 0831)    ED Course/ Medical Decision Making/ A&P                                 Medical Decision Making Amount and/or Complexity of Data Reviewed Labs: ordered. Radiology: ordered.  Risk Prescription drug management.    Medical Screen Complete  This patient presented to the ED with complaint of suspected seizure.  This complaint involves an extensive number of treatment options. The initial differential diagnosis includes, but is not limited to, seizure, drug abuse, metabolic abnormality,  etc.   This presentation is: Acute, Chronic, Self-Limited, Previously Undiagnosed, Uncertain Prognosis, Complicated, Systemic Symptoms, and Threat to Life/Bodily Function  Patient with presentation consistent with possible breakthrough seizure activity. Patient with postictal state after apparent fall. Patient's seizure was not witnessed. However, patient with rapidly improving mentation suggestive of breakthrough seizure.  Patient did report compliance with Keppra. Patient denied recent cocaine abuse.  Urine toxicology screen was positive for cocaine.  Patient observed in ED and improved after administration of IV Keppra and Ativan.   Patient desires discharge home.    Importance of close follow up stressed.   Patient reports that he has plenty of Keppra at home  Additional history obtained:  External records from outside sources obtained and reviewed including prior ED visits and prior Inpatient records.    Lab Tests:  I ordered and personally interpreted labs.  The pertinent results include:  cbc cmp urine tox trop ua   Imaging Studies ordered:  I ordered imaging studies including ct head ct cspine cxr  I independently visualized and interpreted obtained imaging which showed nad I agree with the radiologist interpretation.   Cardiac Monitoring:  The patient was maintained on a cardiac  monitor.  I personally viewed and interpreted the cardiac monitor which showed an underlying rhythm of: nsr   Medicines ordered:  I ordered medication including ativan, keppra  for seizure prophylaxis  Reevaluation of the patient after these medicines showed that the patient: resolved    Problem List / ED Course:  Suspected breakthrough seizure, cocaine abuse   Reevaluation:  After the interventions noted above, I reevaluated the patient and found that they have: resolved  Disposition:  After consideration of the diagnostic results and the patients response to treatment, I feel that the patent would benefit from close outpatient follow up.    CRITICAL CARE Performed by: Wynetta Fines   Total critical care time: 30 minutes  Critical care time was exclusive of separately billable procedures and treating other patients.  Critical care was necessary to treat or prevent imminent or life-threatening deterioration.  Critical care was time spent personally by me on the following activities: development of treatment plan with patient and/or surrogate as well as nursing, discussions with consultants, evaluation of patient's response to treatment, examination of patient, obtaining history from patient or surrogate, ordering and performing treatments and interventions, ordering and review of laboratory studies, ordering and review of radiographic studies, pulse oximetry and re-evaluation of patient's condition.         Final Clinical Impression(s) / ED Diagnoses Final diagnoses:  Seizure (HCC)  Cocaine abuse [facility])    Rx / DC Orders ED Discharge Orders     None         Wynetta Fines, MD 09/09/23 2022

## 2023-10-10 ENCOUNTER — Encounter (HOSPITAL_COMMUNITY): Payer: Self-pay

## 2023-10-10 ENCOUNTER — Emergency Department (HOSPITAL_COMMUNITY): Payer: 59

## 2023-10-10 ENCOUNTER — Inpatient Hospital Stay (HOSPITAL_COMMUNITY)
Admission: EM | Admit: 2023-10-10 | Discharge: 2023-10-13 | DRG: 101 | Disposition: A | Discharge status: Home / Self Care | Payer: 59 | Attending: Internal Medicine | Admitting: Internal Medicine

## 2023-10-10 DIAGNOSIS — Z87891 Personal history of nicotine dependence: Secondary | ICD-10-CM

## 2023-10-10 DIAGNOSIS — Z87820 Personal history of traumatic brain injury: Secondary | ICD-10-CM

## 2023-10-10 DIAGNOSIS — Z7151 Drug abuse counseling and surveillance of drug abuser: Secondary | ICD-10-CM

## 2023-10-10 DIAGNOSIS — K7030 Alcoholic cirrhosis of liver without ascites: Secondary | ICD-10-CM | POA: Diagnosis present

## 2023-10-10 DIAGNOSIS — G9389 Other specified disorders of brain: Secondary | ICD-10-CM | POA: Diagnosis not present

## 2023-10-10 DIAGNOSIS — Z91148 Patient's other noncompliance with medication regimen for other reason: Secondary | ICD-10-CM | POA: Diagnosis not present

## 2023-10-10 DIAGNOSIS — I6523 Occlusion and stenosis of bilateral carotid arteries: Secondary | ICD-10-CM | POA: Diagnosis not present

## 2023-10-10 DIAGNOSIS — Z471 Aftercare following joint replacement surgery: Secondary | ICD-10-CM | POA: Diagnosis not present

## 2023-10-10 DIAGNOSIS — T426X6A Underdosing of other antiepileptic and sedative-hypnotic drugs, initial encounter: Secondary | ICD-10-CM | POA: Diagnosis present

## 2023-10-10 DIAGNOSIS — Z79899 Other long term (current) drug therapy: Secondary | ICD-10-CM

## 2023-10-10 DIAGNOSIS — I69312 Visuospatial deficit and spatial neglect following cerebral infarction: Secondary | ICD-10-CM

## 2023-10-10 DIAGNOSIS — Z66 Do not resuscitate: Secondary | ICD-10-CM | POA: Diagnosis present

## 2023-10-10 DIAGNOSIS — R569 Unspecified convulsions: Principal | ICD-10-CM

## 2023-10-10 DIAGNOSIS — G40901 Epilepsy, unspecified, not intractable, with status epilepticus: Secondary | ICD-10-CM | POA: Diagnosis present

## 2023-10-10 DIAGNOSIS — I651 Occlusion and stenosis of basilar artery: Secondary | ICD-10-CM | POA: Diagnosis not present

## 2023-10-10 DIAGNOSIS — F1410 Cocaine abuse, uncomplicated: Secondary | ICD-10-CM | POA: Diagnosis present

## 2023-10-10 DIAGNOSIS — E876 Hypokalemia: Secondary | ICD-10-CM | POA: Diagnosis present

## 2023-10-10 DIAGNOSIS — F1010 Alcohol abuse, uncomplicated: Secondary | ICD-10-CM | POA: Diagnosis present

## 2023-10-10 DIAGNOSIS — R531 Weakness: Secondary | ICD-10-CM | POA: Diagnosis not present

## 2023-10-10 DIAGNOSIS — Z886 Allergy status to analgesic agent status: Secondary | ICD-10-CM

## 2023-10-10 DIAGNOSIS — I69354 Hemiplegia and hemiparesis following cerebral infarction affecting left non-dominant side: Secondary | ICD-10-CM

## 2023-10-10 DIAGNOSIS — K7682 Hepatic encephalopathy: Secondary | ICD-10-CM | POA: Diagnosis present

## 2023-10-10 DIAGNOSIS — I10 Essential (primary) hypertension: Secondary | ICD-10-CM | POA: Diagnosis present

## 2023-10-10 DIAGNOSIS — B182 Chronic viral hepatitis C: Secondary | ICD-10-CM | POA: Diagnosis present

## 2023-10-10 DIAGNOSIS — H409 Unspecified glaucoma: Secondary | ICD-10-CM | POA: Diagnosis present

## 2023-10-10 DIAGNOSIS — H5462 Unqualified visual loss, left eye, normal vision right eye: Secondary | ICD-10-CM | POA: Diagnosis present

## 2023-10-10 DIAGNOSIS — Z7141 Alcohol abuse counseling and surveillance of alcoholic: Secondary | ICD-10-CM

## 2023-10-10 DIAGNOSIS — R404 Transient alteration of awareness: Secondary | ICD-10-CM | POA: Diagnosis not present

## 2023-10-10 DIAGNOSIS — Z8673 Personal history of transient ischemic attack (TIA), and cerebral infarction without residual deficits: Secondary | ICD-10-CM | POA: Diagnosis not present

## 2023-10-10 DIAGNOSIS — I6381 Other cerebral infarction due to occlusion or stenosis of small artery: Secondary | ICD-10-CM | POA: Diagnosis not present

## 2023-10-10 DIAGNOSIS — R000 Tachycardia, unspecified: Secondary | ICD-10-CM | POA: Diagnosis not present

## 2023-10-10 DIAGNOSIS — R6889 Other general symptoms and signs: Secondary | ICD-10-CM | POA: Diagnosis not present

## 2023-10-10 DIAGNOSIS — Z743 Need for continuous supervision: Secondary | ICD-10-CM | POA: Diagnosis not present

## 2023-10-10 LAB — PROTIME-INR
INR: 1.1 (ref 0.8–1.2)
Prothrombin Time: 14.2 s (ref 11.4–15.2)

## 2023-10-10 LAB — DIFFERENTIAL
Abs Immature Granulocytes: 0.03 10*3/uL (ref 0.00–0.07)
Basophils Absolute: 0 10*3/uL (ref 0.0–0.1)
Basophils Relative: 1 %
Eosinophils Absolute: 0 10*3/uL (ref 0.0–0.5)
Eosinophils Relative: 0 %
Immature Granulocytes: 0 %
Lymphocytes Relative: 38 %
Lymphs Abs: 3.1 10*3/uL (ref 0.7–4.0)
Monocytes Absolute: 0.7 10*3/uL (ref 0.1–1.0)
Monocytes Relative: 9 %
Neutro Abs: 4.3 10*3/uL (ref 1.7–7.7)
Neutrophils Relative %: 52 %

## 2023-10-10 LAB — I-STAT CHEM 8, ED
BUN: 10 mg/dL (ref 8–23)
Calcium, Ion: 1.21 mmol/L (ref 1.15–1.40)
Chloride: 106 mmol/L (ref 98–111)
Creatinine, Ser: 0.9 mg/dL (ref 0.61–1.24)
Glucose, Bld: 121 mg/dL — ABNORMAL HIGH (ref 70–99)
HCT: 50 % (ref 39.0–52.0)
Hemoglobin: 17 g/dL (ref 13.0–17.0)
Potassium: 4.1 mmol/L (ref 3.5–5.1)
Sodium: 142 mmol/L (ref 135–145)
TCO2: 18 mmol/L — ABNORMAL LOW (ref 22–32)

## 2023-10-10 LAB — CBC
HCT: 48.7 % (ref 39.0–52.0)
Hemoglobin: 15.3 g/dL (ref 13.0–17.0)
MCH: 28 pg (ref 26.0–34.0)
MCHC: 31.4 g/dL (ref 30.0–36.0)
MCV: 89.2 fL (ref 80.0–100.0)
Platelets: 260 10*3/uL (ref 150–400)
RBC: 5.46 MIL/uL (ref 4.22–5.81)
RDW: 14.1 % (ref 11.5–15.5)
WBC: 8.2 10*3/uL (ref 4.0–10.5)
nRBC: 0 % (ref 0.0–0.2)

## 2023-10-10 LAB — CBG MONITORING, ED: Glucose-Capillary: 104 mg/dL — ABNORMAL HIGH (ref 70–99)

## 2023-10-10 LAB — COMPREHENSIVE METABOLIC PANEL
ALT: 32 U/L (ref 0–44)
AST: 41 U/L (ref 15–41)
Albumin: 4.4 g/dL (ref 3.5–5.0)
Alkaline Phosphatase: 104 U/L (ref 38–126)
Anion gap: 19 — ABNORMAL HIGH (ref 5–15)
BUN: 9 mg/dL (ref 8–23)
CO2: 17 mmol/L — ABNORMAL LOW (ref 22–32)
Calcium: 9.6 mg/dL (ref 8.9–10.3)
Chloride: 104 mmol/L (ref 98–111)
Creatinine, Ser: 1.07 mg/dL (ref 0.61–1.24)
GFR, Estimated: >60 mL/min (ref >60)
Glucose, Bld: 124 mg/dL — ABNORMAL HIGH (ref 70–99)
Potassium: 4 mmol/L (ref 3.5–5.1)
Sodium: 140 mmol/L (ref 135–145)
Total Bilirubin: 0.3 mg/dL (ref 0.3–1.2)
Total Protein: 8.2 g/dL — ABNORMAL HIGH (ref 6.5–8.1)

## 2023-10-10 LAB — ETHANOL: Alcohol, Ethyl (B): <10 mg/dL (ref <10)

## 2023-10-10 LAB — APTT: aPTT: 29 s (ref 24–36)

## 2023-10-10 LAB — AMMONIA: Ammonia: 105 umol/L — ABNORMAL HIGH (ref 9–35)

## 2023-10-10 MED ORDER — IBUPROFEN 800 MG PO TABS
800.0000 mg | ORAL_TABLET | Freq: Once | ORAL | Status: AC
  Administered 2023-10-10: 800 mg via ORAL
  Filled 2023-10-10: qty 1

## 2023-10-10 MED ORDER — LORAZEPAM 2 MG/ML IJ SOLN
INTRAMUSCULAR | Status: AC
  Administered 2023-10-10: 1 mg via INTRAVENOUS
  Filled 2023-10-10: qty 1

## 2023-10-10 MED ORDER — LORAZEPAM 2 MG/ML IJ SOLN: 1.0000 mg | Freq: Once | INTRAMUSCULAR | Status: AC

## 2023-10-10 MED ORDER — SODIUM CHLORIDE 0.9% FLUSH
3.0000 mL | Freq: Once | INTRAVENOUS | Status: AC
  Administered 2023-10-10: 3 mL via INTRAVENOUS

## 2023-10-10 MED ORDER — LEVETIRACETAM IN NACL 1000 MG/100ML IV SOLN
2000.0000 mg | Freq: Once | INTRAVENOUS | Status: AC
  Administered 2023-10-10: 2000 mg via INTRAVENOUS
  Filled 2023-10-10: qty 200

## 2023-10-10 MED ORDER — IOHEXOL 350 MG/ML SOLN
40.0000 mL | Freq: Once | INTRAVENOUS | Status: AC | PRN
  Administered 2023-10-10: 40 mL via INTRAVENOUS

## 2023-10-10 MED ORDER — THIAMINE HCL 100 MG/ML IJ SOLN
100.0000 mg | Freq: Once | INTRAMUSCULAR | Status: AC
  Administered 2023-10-10: 100 mg via INTRAVENOUS
  Filled 2023-10-10: qty 2

## 2023-10-10 MED ORDER — IOHEXOL 350 MG/ML SOLN
75.0000 mL | Freq: Once | INTRAVENOUS | Status: AC | PRN
  Administered 2023-10-10: 75 mL via INTRAVENOUS

## 2023-10-10 MED ORDER — LACTULOSE 10 GM/15ML PO SOLN
30.0000 g | Freq: Three times a day (TID) | ORAL | Status: DC
  Administered 2023-10-10 – 2023-10-13 (×9): 30 g via ORAL
  Filled 2023-10-10 (×9): qty 60

## 2023-10-10 MED ORDER — LEVETIRACETAM IN NACL 1000 MG/100ML IV SOLN
1000.0000 mg | Freq: Once | INTRAVENOUS | Status: DC
  Filled 2023-10-10: qty 100

## 2023-10-10 NOTE — ED Provider Notes (Signed)
[place] EMERGENCY DEPARTMENT AT [facility] Provider Note   CSN: [id number] Arrival date & time: 10/10/23  2100  An emergency department physician performed an initial assessment on this suspected stroke patient at 2101.  History  Chief Complaint  Patient presents with   Code Stroke   Seizures    Jeffrey Mullen is a 72 y.o. 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  Patient's roommate reports new paralysis at 745 tonight.  Fire and EMS on scene and witnessed 2 seizures with left-sided jerking.  EMS reports seizure lasted for 2 minutes.  No meds administered on arrival, no IV access able to be obtained.  Third seizure on arrival to the ED, witnessed by this provider, patient with tonic-clonic jerking of the left arm, left leg, patient is noncompliant with Keppra, cannot remember when the last time he used it.  He endorses drinking around 5 beers per day but is only had 1 beer today reportedly.   Seizures      Home Medications Prior to Admission medications   Medication Sig Start Date End Date Taking? Authorizing Provider  atropine 1 % ophthalmic solution Place 1 drop into both eyes in the morning and at bedtime.   Yes [provider]  levETIRAcetam (KEPPRA) 100 MG/ML solution Take 5 mLs (500 mg total) by mouth 2 (two) times daily. Patient taking differently: Take 500 mg by mouth 2 (two) times daily as needed (seizure). 04/04/22  Yes Anders Simmonds, PA-C      Allergies    Tylenol [acetaminophen]    Review of Systems   Review of Systems  Neurological:  Positive for seizures.  All other systems reviewed and are negative.   Physical Exam Updated Vital Signs BP (!)  190/89   Pulse 96   Resp 17   SpO2 97%  Physical Exam Vitals and nursing note reviewed.  Constitutional:      General: He is not in acute distress.    Appearance: He is ill-appearing.     Comments: Chronically ill-appearing  HENT:     Head: Normocephalic and atraumatic.  Eyes:     General:        Right eye: No discharge.        Left eye: No discharge.  Cardiovascular:     Rate and Rhythm: Normal rate and regular rhythm.     Heart sounds: No murmur heard.    No friction rub. No gallop.  Pulmonary:     Effort: Pulmonary effort is normal.     Breath sounds: Normal breath sounds.  Abdominal:     General: Bowel sounds are normal.     Palpations: Abdomen is soft.  Skin:    General: Skin is warm and dry.     Capillary Refill: Capillary refill takes less than 2 seconds.  Neurological:     Comments: On arrival with concern for paralysis, left greater than right weakness.  He has some inability to feel touch on the left, has difficulty moving left limbs spontaneously without significant effort.  He has left-sided visual neglect, rightward gaze.  Endorses equal touch bilaterally.  On arrival was having tonic-clonic activity of the left side only.  Was still following commands spontaneously during tonic-clonic activity  Psychiatric:  Mood and Affect: Mood normal.        Behavior: Behavior normal.     ED Results / Procedures / Treatments   Labs (all labs ordered are listed, but only abnormal results are displayed) Labs Reviewed  COMPREHENSIVE METABOLIC PANEL - Abnormal; Notable for the following components:      Result Value   CO2 17 (*)    Glucose, Bld 124 (*)    Total Protein 8.2 (*)    Anion gap 19 (*)    All other components within normal limits  AMMONIA - Abnormal; Notable for the following components:   Ammonia 105 (*)    All other components within normal limits  I-STAT CHEM 8, ED - Abnormal; Notable for the following components:   Glucose, Bld 121 (*)    TCO2  18 (*)    All other components within normal limits  CBG MONITORING, ED - Abnormal; Notable for the following components:   Glucose-Capillary 104 (*)    All other components within normal limits  PROTIME-INR  APTT  CBC  DIFFERENTIAL  ETHANOL  LEVETIRACETAM LEVEL    EKG None  Radiology CT ANGIO HEAD NECK W WO CM W PERF (CODE STROKE)  Result Date: 10/10/2023 CLINICAL DATA:  Left-sided weakness, history of seizures EXAM: CT ANGIOGRAPHY HEAD AND NECK CT PERFUSION BRAIN TECHNIQUE: Multidetector CT imaging of the head and neck was performed using the standard protocol during bolus administration of intravenous contrast. Multiplanar CT image reconstructions and MIPs were obtained to evaluate the vascular anatomy. Carotid stenosis measurements (when applicable) are obtained utilizing NASCET criteria, using the distal internal carotid diameter as the denominator. Multiphase CT imaging of the brain was performed following IV bolus contrast injection. Subsequent parametric perfusion maps were calculated using RAPID software. RADIATION DOSE REDUCTION: This exam was performed according to the departmental dose-optimization program which includes automated exposure control, adjustment of the mA and/or kV according to patient size and/or use of iterative reconstruction technique. CONTRAST:  75mL OMNIPAQUE IOHEXOL 350 MG/ML SOLN; 40mL OMNIPAQUE IOHEXOL 350 MG/ML SOLN COMPARISON:  10/23/2021 CTA head and neck, correlation is also made with 10/10/2023 CT head FINDINGS: CT HEAD FINDINGS For noncontrast findings, please see same day CT head. CTA NECK FINDINGS Aortic arch: Two-vessel arch with a common origin of the brachiocephalic and left common carotid arteries. Imaged portion shows no evidence of aneurysm or dissection. No significant stenosis of the major arch vessel origins. Aortic atherosclerosis. Right carotid system: No evidence of dissection, occlusion, or hemodynamically significant stenosis (greater than  50%). Atherosclerotic disease at the bifurcation and in the proximal ICA is not hemodynamically significant. Left carotid system: 70% stenosis in the proximal left ICA (series 7, image 227). No evidence of dissection. Vertebral arteries: Left dominant system. Severe stenosis in the proximal right V1 (series 7, image 299), which is new from the prior exam. The vertebral arteries are otherwise patent to the skull base without significant stenosis. Skeleton: No acute osseous abnormality. Degenerative changes in the cervical spine. Other neck: No acute finding. Upper chest: No focal pulmonary opacity or pleural effusion. Review of the MIP images confirms the above findings CTA HEAD FINDINGS Anterior circulation: Both internal carotid arteries are patent to the termini, with diffuse dense calcifications, that cause severe stenosis in the distal cavernous ICA bilaterally. A1 segments patent. Normal anterior communicating artery. Anterior cerebral arteries are patent to their distal aspects without significant stenosis. Multifocal mild stenosis in the MCAs bilaterally (series 7 images 87, 96, and 109, for example). No  high-grade stenosis. Posterior circulation: Vertebral arteries patent to the vertebrobasilar junction with severe stenosis in the right V4 (series 7, image 152 and 196), similar to prior. Posterior inferior cerebellar arteries patent proximally. Basilar patent to its distal aspect with moderate to severe stenosis distally, which is new from the prior exam (series 7, image 111 and series 8, image 145). Superior cerebellar arteries patent proximally. Patent P1 segments, hypoplastic on the left. Near fetal origin of the left PCA from the left posterior communicating artery. Mild-to-moderate stenosis in the proximal right P2 (series 7, image 112) and moderate stenosis in a left P3 branch (series 7, image 107), similar prior. Venous sinuses: As permitted by contrast timing, patent. Anatomic variants: Near fetal  origin of the left PCA. No evidence of aneurysm or vascular malformation. Review of the MIP images confirms the above findings CT Brain Perfusion Findings: ASPECTS: 10 CBF (<30%) Volume: 0mL Perfusion (Tmax>6.0s) volume: 6mL Mismatch Volume: 6mL Infarction Location:No infarct core, area of possible ischemic penumbra in the right PCA territory in the medial right parietal lobe. IMPRESSION: 1. No intracranial large vessel occlusion. 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 2. Severe stenosis in the proximal right V1, which is new from the prior exam. Severe stenosis in the right V4, which is similar to the prior exam. 3. 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 4. Mild-to-moderate stenosis in the proximal right P2 and moderate stenosis in a left P3 branch, similar prior. 5. 70% stenosis in the proximal left ICA. 6. No infarct core. Area of possible ischemic penumbra in the right PCA territory in the medial right parietal lobe. 7. Aortic atherosclerosis. Aortic Atherosclerosis (ICD10-I70.0). Imaging results were communicated on 10/10/2023 at 10:07 pm to provider Dr. Derry Lory via secure text paging. Electronically Signed   By: Wiliam Ke M.D.   On: 10/10/2023 22:07   CT CEREBRAL PERFUSION W CONTRAST  Result Date: 10/10/2023 CLINICAL DATA:  Left-sided weakness, history of seizures EXAM: CT ANGIOGRAPHY HEAD AND NECK CT PERFUSION BRAIN TECHNIQUE: Multidetector CT imaging of the head and neck was performed using the standard protocol during bolus administration of intravenous contrast. Multiplanar CT image reconstructions and MIPs were obtained to evaluate the vascular anatomy. Carotid stenosis measurements (when applicable) are obtained utilizing NASCET criteria, using the distal internal carotid diameter as the denominator. Multiphase CT imaging of the brain was performed following IV bolus contrast injection. Subsequent parametric perfusion maps  were calculated using RAPID software. RADIATION DOSE REDUCTION: This exam was performed according to the departmental dose-optimization program which includes automated exposure control, adjustment of the mA and/or kV according to patient size and/or use of iterative reconstruction technique. CONTRAST:  75mL OMNIPAQUE IOHEXOL 350 MG/ML SOLN; 40mL OMNIPAQUE IOHEXOL 350 MG/ML SOLN COMPARISON:  10/23/2021 CTA head and neck, correlation is also made with 10/10/2023 CT head FINDINGS: CT HEAD FINDINGS For noncontrast findings, please see same day CT head. CTA NECK FINDINGS Aortic arch: Two-vessel arch with a common origin of the brachiocephalic and left common carotid arteries. Imaged portion shows no evidence of aneurysm or dissection. No significant stenosis of the major arch vessel origins. Aortic atherosclerosis. Right carotid system: No evidence of dissection, occlusion, or hemodynamically significant stenosis (greater than 50%). Atherosclerotic disease at the bifurcation and in the proximal ICA is not hemodynamically significant. Left carotid system: 70% stenosis in the proximal left ICA (series 7, image 227). No evidence of dissection. Vertebral arteries: Left dominant system. Severe stenosis in the  proximal right V1 (series 7, image 299), which is new from the prior exam. The vertebral arteries are otherwise patent to the skull base without significant stenosis. Skeleton: No acute osseous abnormality. Degenerative changes in the cervical spine. Other neck: No acute finding. Upper chest: No focal pulmonary opacity or pleural effusion. Review of the MIP images confirms the above findings CTA HEAD FINDINGS Anterior circulation: Both internal carotid arteries are patent to the termini, with diffuse dense calcifications, that cause severe stenosis in the distal cavernous ICA bilaterally. A1 segments patent. Normal anterior communicating artery. Anterior cerebral arteries are patent to their distal aspects without  significant stenosis. Multifocal mild stenosis in the MCAs bilaterally (series 7 images 87, 96, and 109, for example). No high-grade stenosis. Posterior circulation: Vertebral arteries patent to the vertebrobasilar junction with severe stenosis in the right V4 (series 7, image 152 and 196), similar to prior. Posterior inferior cerebellar arteries patent proximally. Basilar patent to its distal aspect with moderate to severe stenosis distally, which is new from the prior exam (series 7, image 111 and series 8, image 145). Superior cerebellar arteries patent proximally. Patent P1 segments, hypoplastic on the left. Near fetal origin of the left PCA from the left posterior communicating artery. Mild-to-moderate stenosis in the proximal right P2 (series 7, image 112) and moderate stenosis in a left P3 branch (series 7, image 107), similar prior. Venous sinuses: As permitted by contrast timing, patent. Anatomic variants: Near fetal origin of the left PCA. No evidence of aneurysm or vascular malformation. Review of the MIP images confirms the above findings CT Brain Perfusion Findings: ASPECTS: 10 CBF (<30%) Volume: 0mL Perfusion (Tmax>6.0s) volume: 6mL Mismatch Volume: 6mL Infarction Location:No infarct core, area of possible ischemic penumbra in the right PCA territory in the medial right parietal lobe. IMPRESSION: 1. No intracranial large vessel occlusion. 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 2. Severe stenosis in the proximal right V1, which is new from the prior exam. Severe stenosis in the right V4, which is similar to the prior exam. 3. 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 4. Mild-to-moderate stenosis in the proximal right P2 and moderate stenosis in a left P3 branch, similar prior. 5. 70% stenosis in the proximal left ICA. 6. No infarct core. Area of possible ischemic penumbra in the right PCA territory in the medial right parietal lobe. 7.  Aortic atherosclerosis. Aortic Atherosclerosis (ICD10-I70.0). Imaging results were communicated on 10/10/2023 at 10:07 pm to provider Dr. Derry Lory via secure text paging. Electronically Signed   By: Wiliam Ke M.D.   On: 10/10/2023 22:07   CT HEAD CODE STROKE WO CONTRAST  Result Date: 10/10/2023 CLINICAL DATA:  Code stroke.  Left-sided weakness, seizures EXAM: CT HEAD WITHOUT CONTRAST TECHNIQUE: Contiguous axial images were obtained from the base of the skull through the vertex without intravenous contrast. RADIATION DOSE REDUCTION: This exam was performed according to the departmental dose-optimization program which includes automated exposure control, adjustment of the mA and/or kV according to patient size and/or use of iterative reconstruction technique. COMPARISON:  09/09/2023 FINDINGS: Brain: Hemorrhage, mass, mass effect, or midline shift. No hydrocephalus or extra-axial collection. 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 Redemonstrated bilateral basal ganglia lacunar infarcts. Vascular: No hyperdense vessel. Skull: Negative for fracture or focal lesion. 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 Clear paranasal sinuses. No acute finding in the orbits. Other: The mastoid air cells are well aerated. ASPECTS (  [place] Stroke Program Early CT Score) - Ganglionic level infarction (caudate, lentiform nuclei, internal capsule, insula, M1-M3 cortex): 7 - Supraganglionic infarction (M4-M6 cortex): 3 Total score (0-10 with 10 being normal): 10 IMPRESSION: No acute intracranial hemorrhage or evidence of acute territorial infarct. ASPECTS is 10. Imaging results were communicated on 10/10/2023 at 9:30 pm to provider Dr. Derry Lory via secure text paging. Electronically Signed   By: Wiliam Ke M.D.   On: 10/10/2023 21:30    Procedures .Critical Care  Performed by: Olene Floss, PA-C Authorized by: Olene Floss, PA-C   Critical  care provider statement:    Critical care time (minutes):  35   Critical care was necessary to treat or prevent imminent or life-threatening deterioration of the following conditions:  CNS failure or compromise (code stroke activated, TNK considered)   Critical care was time spent personally by me on the following activities:  Development of treatment plan with patient or surrogate, discussions with consultants, evaluation of patient's response to treatment, examination of patient, ordering and review of laboratory studies, ordering and review of radiographic studies, ordering and performing treatments and interventions, pulse oximetry, re-evaluation of patient's condition and review of old charts   Care discussed with: admitting provider       Medications Ordered in ED Medications  levETIRAcetam (KEPPRA) IVPB 1000 mg/100 mL premix (2,000 mg Intravenous New Bag/Given 10/10/23 2242)  lactulose (CHRONULAC) 10 GM/15ML solution 30 g (has no administration in time range)  ibuprofen (ADVIL) tablet 800 mg (has no administration in time range)  sodium chloride flush (NS) 0.9 % injection 3 mL (3 mLs Intravenous Given 10/10/23 2242)  thiamine (VITAMIN B1) injection 100 mg (100 mg Intravenous Given 10/10/23 2242)  iohexol (OMNIPAQUE) 350 MG/ML injection 75 mL (75 mLs Intravenous Contrast Given 10/10/23 2133)  LORazepam (ATIVAN) injection 1 mg (1 mg Intravenous Given 10/10/23 2105)  iohexol (OMNIPAQUE) 350 MG/ML injection 40 mL (40 mLs Intravenous Contrast Given 10/10/23 2158)    ED Course/ Medical Decision Making/ A&P                                 Medical Decision Making Amount and/or Complexity of Data Reviewed Labs: ordered. Radiology: ordered.  Risk Prescription drug management.   This patient is a 72 y.o. male who presents to the ED for concern of weakness, possible seizure-like activity, this involves an extensive number of treatment options, and is a complaint that carries with it a  high risk of complications and morbidity. The emergent differential diagnosis prior to evaluation includes, but is not limited to,  CVA, spinal cord injury, ACS, arrhythmia, syncope, orthostatic hypotension, sepsis, hypoglycemia, hypoxia, electrolyte disturbance, endocrine disorder, anemia, environmental exposure, polypharmacy  High clinical consideration for active stroke in progress, versus complex seizure given his presentation on arrival. This is not an exhaustive differential.   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 Pertinent results include: reviewed labwork, imaging from previous hospital admissions, multiple previous seizures in the ED  Physical Exam: Physical exam performed. The pertinent findings include: On arrival with concern for paralysis, left greater than right weakness.  He has some inability to feel touch on the left, has difficulty moving left limbs spontaneously without significant effort.  He has left-sided visual neglect, rightward  gaze.  Endorses equal touch bilaterally.  On arrival was having tonic-clonic activity of the left side only.  Was still following commands spontaneously during tonic-clonic activity   Lab Tests: I ordered, and personally interpreted labs.  The pertinent results include:  cbc unremarkable, bmp notable for anion gap 19, bicarb deficit co2 17, ammonia 105, negative ethanol level.   Imaging Studies: I ordered imaging studies including CT angio, cerebral perfusion, code stroke without contrast. I independently visualized and interpreted imaging which showed no acute stroke, 1. No intracranial large vessel occlusion. Severe stenosis in the  distal cavernous ICA bilaterally.  2. Severe stenosis in the  proximal right V1, which is new from the  prior exam. Severe stenosis in the right V4, which is similar to the  prior exam.  3. 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  4. Mild-to-moderate stenosis in the proximal right P2 and moderate  stenosis in a left P3 branch, similar prior.  5. 70% stenosis in the proximal left ICA.  6. No infarct core. Area of possible ischemic penumbra in the right  PCA territory in the medial right parietal lobe.    . I agree with the radiologist interpretation.   Cardiac Monitoring:  The patient was maintained on a cardiac monitor.  My attending physician Dr. Wallace Cullens viewed and interpreted the cardiac monitored which showed an underlying rhythm of: sinus tachycardia, minimal st depression, inferior leads. I agree with this interpretation.   Medications: I ordered medication including thiamine, Ativan, Keppra for seizure in progress, seizure loading, suspected possible thiamine deficiency from history of severe alcohol abuse, lactulose for hyperammonemia   Consultations Obtained: I requested consultation with the neurologist, spoke with Dr. Derry Lory, who will consult on this case, made recommendations for seizure treatment as above, and recommends hospital admission,  spoke with Dr. Margo Aye and discussed lab and imaging findings as well as pertinent plan - they recommend: admission as above. EEG continuous until the morning, keppra, MR in the morning if any persistent left sided weakness   Disposition: After consideration of the diagnostic results and the patients response to treatment, I feel that patient needs admission as above .   I discussed this case with my attending physician Dr. Wallace Cullens who cosigned this note including patient's presenting symptoms, physical exam, and planned diagnostics and interventions. Attending physician stated agreement with plan or made changes to plan which were implemented.    Final  Clinical Impression(s) / ED Diagnoses Final diagnoses:  Seizure [facility])    Rx / DC Orders ED Discharge Orders     None         Olene Floss, PA-C 10/10/23 2256    Franne Forts, DO 10/11/23 0205
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
## 2023-10-10 NOTE — Consult Note (Addendum)
NEUROLOGY CONSULT NOTE   Date of service: October 10, 2023 Patient Name: Jeffrey Mullen MRN:  [id number] DOB:  [birth year] Chief Complaint: "seizure, left sided weakness" Requesting Provider: Franne Forts, DO  History of Present Illness  GUNNAR HEREFORD is a 72 y.o. 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  In the ED, a code stroke was activated for Left sided weakness out of proportion to wakefulness.  On my evaluation, he knows he is in the hospital, has no recollection of what brought him on. Remembers being brought in by ambulance. He is not taking his seizure medications.  LKW: 1930 Modified rankin score: 2-Slight disability-UNABLE to perform all activities but does not need assistance IV Thrombolysis: not offered, low suspicion for stroke based on imaging and improving clinical exam. EVT: not offered, no LVO.  NIHSS components Score: Comment  1a Level of Conscious 0[x]  1[]  2[]  3[]      1b LOC Questions 0[]  1[x]  2[]       1c LOC Commands 0[x]  1[]  2[]       2 Best Gaze 0[]  1[]  2[x]       3 Visual 0[]  1[]  2[x]  3[]      4 Facial Palsy 0[]  1[]  2[x]  3[]      5a Motor Arm - left 0[]  1[]  2[x]  3[]  4[]  UN[]    5b Motor Arm - Right 0[x]  1[]  2[]  3[]  4[]  UN[]    6a Motor Leg - Left 0[]  1[]  2[x]  3[]  4[]  UN[]    6b Motor Leg - Right 0[]  1[x]  2[]  3[]  4[]  UN[]    7 Limb Ataxia 0[x]  1[]  2[]  3[]  UN[]     8 Sensory 0[]  1[]  2[x]  UN[]      9 Best Language 0[x]  1[]  2[]  3[]      10 Dysarthria 0[x]  1[]  2[]  UN[]      11 Extinct. and Inattention 0[x]  1[]  2[]       TOTAL: 14      ROS  nable to ascertain due to midl confusion.  Past History   Past Medical History:   Diagnosis Date   Alcohol abuse    Allergy    Anemia    Anxiety    Arthritis    Blind left eye    Cataract, right eye    Congenital blindness    LEFT EYE   Depression    Glaucoma, bilateral    Hepatic cirrhosis (HCC)    Hepatitis    HEP C treated in 2016   HTN (hypertension)    Seizures (HCC)    3 years ago    Past Surgical History:  Procedure Laterality Date   CATARACT EXTRACTION Left    GLAUCOMA SURGERY     Patient reports 10 or eleven surgeries for Glaucoma    Family History: Family History  Problem Relation Age of Onset   Hearing loss Other    Kidney disease Neg Hx    Colon cancer Neg Hx    Esophageal cancer Neg Hx    Rectal cancer Neg Hx    Stomach cancer Neg Hx     Social History  reports that he quit smoking about 2 years ago. His smoking use included cigarettes. He has never used smokeless tobacco. He reports  current alcohol use of about 5.0 standard drinks of alcohol per week. He reports that he does not use drugs.  Allergies  Allergen Reactions   Tylenol [Acetaminophen] Other (See Comments)    Causes eye pressure to elevate; CANNOT HAVE THIS    Medications   Current Facility-Administered Medications:    levETIRAcetam (KEPPRA) IVPB 1000 mg/100 mL premix, 1,000 mg, Intravenous, Once, Prosperi, Christian H, PA-C   LORazepam (ATIVAN) injection 1 mg, 1 mg, Intravenous, Once, Prosperi, Christian H, PA-C   sodium chloride flush (NS) 0.9 % injection 3 mL, 3 mL, Intravenous, Once, Prosperi, Christian H, PA-C   thiamine (VITAMIN B1) injection 100 mg, 100 mg, Intravenous, Once, Prosperi, Christian H, PA-C  Current Outpatient Medications:    carvedilol (COREG) 12.5 MG tablet, Take 1 tablet (12.5 mg total) by mouth 2 (two) times daily with a meal., Disp: 60 tablet, Rfl: 3   amLODipine (NORVASC) 10 MG tablet, TAKE ONE TABLET BY MOUTH DAILY, Disp: 30 tablet, Rfl: 0   aspirin 81 MG chewable tablet, Chew 1 tablet (81 mg total) by mouth daily., Disp: 30 tablet, Rfl:  3   atropine 1 % ophthalmic solution, Place 1 drop into the left eye daily., Disp: , Rfl:    busPIRone (BUSPAR) 15 MG tablet, TAKE ONE TABLET BY MOUTH THREE TIMES A DAY, Disp: 90 tablet, Rfl: 0   cholecalciferol (VITAMIN D3) 25 MCG (1000 UNIT) tablet, Take 1,000 Units by mouth daily., Disp: , Rfl:    ibuprofen (ADVIL) 800 MG tablet, TAKE 1 TABLET (800 MG TOTAL) BY MOUTH EVERY EIGHT (EIGHT) HOURS AS NEEDED. ONLY TAKE WITH FOOD, Disp: 42 tablet, Rfl: 0   levETIRAcetam (KEPPRA) 100 MG/ML solution, Take 5 mLs (500 mg total) by mouth 2 (two) times daily. (Patient taking differently: Take 500 mg by mouth 2 (two) times daily as needed (seizure).), Disp: 473 mL, Rfl: 12   LUMIGAN 0.01 % SOLN, Place 1 drop into the right eye at bedtime., Disp: , Rfl:    pantoprazole (PROTONIX) 40 MG tablet, Take 40 mg by mouth daily., Disp: , Rfl:    SANTYL 250 UNIT/GM ointment, Apply 1 Application topically daily., Disp: , Rfl:    Thiamine HCl (VITAMIN B-1) 250 MG tablet, Take 250 mg by mouth daily., Disp: , Rfl:    vitamin B-12 (CYANOCOBALAMIN) 100 MCG tablet, Take 100 mcg by mouth daily., Disp: , Rfl:   Vitals   Vitals:   10/10/23 2115  BP: (!) 190/89  Pulse: 96  Resp: 17  SpO2: 97%    There is no height or weight on file to calculate BMI.  Physical Exam   Constitutional: Appears dishelved, and unkempt. Psych: Affect appropriate to situation.  Eyes: No scleral injection.  HENT: No OP obstruction.  Head: Normocephalic.  Cardiovascular: Normal rate and regular rhythm.  Respiratory: Effort normal, non-labored breathing.  GI: Soft.  No distension. There is no tenderness.  Skin: WDI.   Neurologic Examination  Mental status/Cognition: Alert, oriented to self, place, month and year, good attention.  Speech/language: dysarthric speech, fluent, comprehension intact, object naming intact, repetition intact. Cranial nerves:   CN II L eye corneal opacified, R eye pupil is round and reactive to light, L  hemianopsia.   CN III,IV,VI R gaze preference, does not cross midline.   CN V L face sensory deficit   CN VII L facial droop   CN VIII normal hearing to speech   CN IX & X normal palatal elevation, no uvular deviation   CN XI  5/5 head turn and 5/5 shoulder shrug bilaterally   CN XII midline tongue protrusion   Motor:  Muscle bulk: poor, tone normal Mvmt Root Nerve  Muscle Right Left Comments  SA C5/6 Ax Deltoid     EF C5/6 Mc Biceps 5 3 Lue fluctuating weakness. Initially, unable to move the L Arm and later noted to be 3/5  EE C6/7/8 Rad Triceps 5 3   WF C6/7 Med FCR     WE C7/8 PIN ECU     F Ab C8/T1 U ADM/FDI 5 3   HF L1/2/3 Fem Illopsoas 5 4   KE L2/3/4 Fem Quad 5    DF L4/5 D Peron Tib Ant 5 4+   PF S1/2 Tibial Grc/Sol 5 4+    Sensation:  Light touch Absent to touch on the left.   Pin prick    Temperature    Vibration   Proprioception    Coordination/Complex Motor:  - Finger to Nose intact on the right, unable to do with LUE. - Heel to shin unable to do - Rapid alternating movement unable to do with left upper extremity. - Gait: deferred.   Labs   CBC:  Recent Labs  Lab 10/10/23 2104 10/10/23 2109  WBC 8.2  --   NEUTROABS 4.3  --   HGB 15.3 17.0  HCT 48.7 50.0  MCV 89.2  --   PLT 260  --     Basic Metabolic Panel:  Lab Results  Component Value Date   NA 142 10/10/2023   K 4.1 10/10/2023   CO2 25 09/09/2023   GLUCOSE 121 (H) 10/10/2023   BUN 10 10/10/2023   CREATININE 0.90 10/10/2023   CALCIUM 8.9 09/09/2023   GFRNONAA >60 09/09/2023   GFRAA 80 12/07/2020   Lipid Panel:  Lab Results  Component Value Date   LDLCALC 69 04/04/2022   HgbA1c:  Lab Results  Component Value Date   HGBA1C 5.8 (H) 06/18/2022   Urine Drug Screen:     Component Value Date/Time   LABOPIA NONE DETECTED 09/09/2023 1300   COCAINSCRNUR POSITIVE (A) 09/09/2023 1300   LABBENZ NONE DETECTED 09/09/2023 1300   AMPHETMU NONE DETECTED 09/09/2023 1300   THCU NONE DETECTED  09/09/2023 1300   LABBARB NONE DETECTED 09/09/2023 1300    Alcohol Level     Component Value Date/Time   ETH <10 10/10/2023 2107   INR  Lab Results  Component Value Date   INR 1.1 10/10/2023   APTT  Lab Results  Component Value Date   APTT 29 10/10/2023   AED levels:  Lab Results  Component Value Date   PHENYTOIN 27.4 (H) 10/25/2021   LEVETIRACETA 2.9 (L) 09/09/2023   AMMONIA of 105.  CT Head without contrast(Personally reviewed): CTH was negative for a large hypodensity concerning for a large territory infarct or hyperdensity concerning for an ICH  CT angio Head and Neck with contrast(Personally reviewed): No LVO  CT perfusion: Possible high right parietal penumbra of 6ml, unlikely to explain his deficit  MRI Brain: pending  cEEG:  pending  Impression   DMITRIY GAIR is a 72 y.o. 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  Here, he has L hemiplegia, hemisensory  deficit with L hemianopasia and R gaze deviation. He is awake, alert and communicating. Typically, I see significant somnolence in the post ictal period and therefore was worked up as potential acute stroke. However, with negative CT Head, no LVO on CT angio and no significant mismatch on CTP and some mild improvement in exam, suspect this was likely a seizure with post ictal left sided todds.  Given his non compliance with AEDs and prior subclinical seizures noted on LTM EEG, will hook him up to LTM to monitor for subclinical seizures.  Recommendations  - LTM Eeg, if negative for subclinical seizures in AM, can be unhooked. - loaded with Keppra 3grams and start Keppra 500mg  BID - Ativan 1-2mg  PRN for  seizure lasting more than 3 mins. - Seizure precautions with seizure pads. - MRI Brain w/o contrast if he is still weak on the left in AM. - we will continue to follow along. - no driving for 6 months. Has to be seizure free before he can resume driving. - treat hyperammonemia with lactulose TID  - CIWA precautions. ______________________________________________________________________  This patient is critically ill and at significant risk of neurological worsening, death and care requires constant monitoring of vital signs, hemodynamics,respiratory and cardiac monitoring, neurological assessment, discussion with family, other specialists and medical decision making of high complexity. I spent 70 minutes of neurocritical care time  in the care of  this patient. This was time spent independent of any time provided by nurse practitioner or PA.  Erick Blinks [facility] 10/10/2023  10:21 PM  Signed,  Erick Blinks [facility]

## 2023-10-10 NOTE — Code Documentation (Signed)
Responded to Code Stroke called on pt already in the ED for L sided weakness and seizures. Code Stroke was called at 2101 for L sided weakness, LSN-1930. NIH-14 CT head-negative for acute changes, CTA/CTP-no LVO. TNK not given-improving symptoms, low suspicion for stroke. Plan: seizure w/o, MRI in AM if L side still weak.

## 2023-10-11 ENCOUNTER — Inpatient Hospital Stay (HOSPITAL_COMMUNITY): Payer: 59

## 2023-10-11 DIAGNOSIS — R569 Unspecified convulsions: Secondary | ICD-10-CM | POA: Diagnosis not present

## 2023-10-11 LAB — COMPREHENSIVE METABOLIC PANEL
ALT: 34 U/L (ref 0–44)
AST: 38 U/L (ref 15–41)
Albumin: 3.6 g/dL (ref 3.5–5.0)
Alkaline Phosphatase: 92 U/L (ref 38–126)
Anion gap: 11 (ref 5–15)
BUN: 6 mg/dL — ABNORMAL LOW (ref 8–23)
CO2: 24 mmol/L (ref 22–32)
Calcium: 9.4 mg/dL (ref 8.9–10.3)
Chloride: 105 mmol/L (ref 98–111)
Creatinine, Ser: 0.92 mg/dL (ref 0.61–1.24)
GFR, Estimated: >60 mL/min (ref >60)
Glucose, Bld: 125 mg/dL — ABNORMAL HIGH (ref 70–99)
Potassium: 3.3 mmol/L — ABNORMAL LOW (ref 3.5–5.1)
Sodium: 140 mmol/L (ref 135–145)
Total Bilirubin: 0.6 mg/dL (ref 0.3–1.2)
Total Protein: 6.9 g/dL (ref 6.5–8.1)

## 2023-10-11 LAB — CBC
HCT: 38.9 % — ABNORMAL LOW (ref 39.0–52.0)
Hemoglobin: 13.2 g/dL (ref 13.0–17.0)
MCH: 27.5 pg (ref 26.0–34.0)
MCHC: 33.9 g/dL (ref 30.0–36.0)
MCV: 81 fL (ref 80.0–100.0)
Platelets: 243 10*3/uL (ref 150–400)
RBC: 4.8 MIL/uL (ref 4.22–5.81)
RDW: 13.9 % (ref 11.5–15.5)
WBC: 7.3 10*3/uL (ref 4.0–10.5)
nRBC: 0 % (ref 0.0–0.2)

## 2023-10-11 LAB — LACTIC ACID, PLASMA
Lactic Acid, Venous: 1.9 mmol/L (ref 0.5–1.9)
Lactic Acid, Venous: 2.1 mmol/L (ref 0.5–1.9)

## 2023-10-11 LAB — PHOSPHORUS: Phosphorus: 3.3 mg/dL (ref 2.5–4.6)

## 2023-10-11 LAB — MAGNESIUM: Magnesium: 2 mg/dL (ref 1.7–2.4)

## 2023-10-11 LAB — HIV ANTIBODY (ROUTINE TESTING W REFLEX): HIV Screen 4th Generation wRfx: NONREACTIVE

## 2023-10-11 LAB — AMMONIA: Ammonia: 49 umol/L — ABNORMAL HIGH (ref 9–35)

## 2023-10-11 MED ORDER — MIDAZOLAM HCL 2 MG/2ML IJ SOLN: 2.0000 mg | Freq: Once | INTRAMUSCULAR | Status: DC

## 2023-10-11 MED ORDER — LEVETIRACETAM 500 MG PO TABS
500.0000 mg | ORAL_TABLET | Freq: Two times a day (BID) | ORAL | Status: DC
  Administered 2023-10-11 – 2023-10-13 (×6): 500 mg via ORAL
  Filled 2023-10-11 (×7): qty 1

## 2023-10-11 MED ORDER — LORAZEPAM 0.5 MG PO TABS: 0.5000 mg | ORAL_TABLET | Freq: Four times a day (QID) | ORAL | Status: DC | PRN

## 2023-10-11 MED ORDER — THIAMINE MONONITRATE 100 MG PO TABS
100.0000 mg | ORAL_TABLET | Freq: Every day | ORAL | Status: DC
  Administered 2023-10-11 – 2023-10-13 (×3): 100 mg via ORAL
  Filled 2023-10-11 (×3): qty 1

## 2023-10-11 MED ORDER — ATROPINE SULFATE 1 % OP SOLN
1.0000 [drp] | Freq: Three times a day (TID) | OPHTHALMIC | Status: DC
  Administered 2023-10-11 – 2023-10-13 (×7): 1 [drp] via OPHTHALMIC
  Filled 2023-10-11: qty 2

## 2023-10-11 MED ORDER — IBUPROFEN 200 MG PO TABS: 400.0000 mg | ORAL_TABLET | Freq: Four times a day (QID) | ORAL | Status: DC | PRN

## 2023-10-11 MED ORDER — PROCHLORPERAZINE EDISYLATE 10 MG/2ML IJ SOLN: 5.0000 mg | Freq: Four times a day (QID) | INTRAMUSCULAR | Status: DC | PRN

## 2023-10-11 MED ORDER — FOLIC ACID 1 MG PO TABS
1.0000 mg | ORAL_TABLET | Freq: Every day | ORAL | Status: DC
  Administered 2023-10-11 – 2023-10-13 (×3): 1 mg via ORAL
  Filled 2023-10-11 (×3): qty 1

## 2023-10-11 MED ORDER — HYDRALAZINE HCL 20 MG/ML IJ SOLN: 5.0000 mg | Freq: Four times a day (QID) | INTRAMUSCULAR | Status: DC | PRN

## 2023-10-11 MED ORDER — POLYETHYLENE GLYCOL 3350 17 G PO PACK: 17.0000 g | PACK | Freq: Every day | ORAL | Status: DC | PRN

## 2023-10-11 MED ORDER — LORAZEPAM 2 MG/ML IJ SOLN: 1.0000 mg | Freq: Four times a day (QID) | INTRAMUSCULAR | Status: DC | PRN

## 2023-10-11 MED ORDER — THIAMINE HCL 100 MG/ML IJ SOLN: 100.0000 mg | Freq: Every day | INTRAMUSCULAR | Status: DC

## 2023-10-11 MED ORDER — ADULT MULTIVITAMIN W/MINERALS CH
1.0000 | ORAL_TABLET | Freq: Every day | ORAL | Status: DC
  Administered 2023-10-11 – 2023-10-13 (×3): 1 via ORAL
  Filled 2023-10-11 (×3): qty 1

## 2023-10-11 MED ORDER — LORAZEPAM 2 MG/ML IJ SOLN
1.0000 mg | INTRAMUSCULAR | Status: AC
  Administered 2023-10-11: 1 mg via INTRAVENOUS
  Filled 2023-10-11: qty 1

## 2023-10-11 MED ORDER — LORAZEPAM 1 MG PO TABS: 1.0000 mg | ORAL_TABLET | ORAL | Status: DC | PRN

## 2023-10-11 MED ORDER — MELATONIN 5 MG PO TABS: 5.0000 mg | ORAL_TABLET | Freq: Every evening | ORAL | Status: DC | PRN

## 2023-10-11 MED ORDER — ENOXAPARIN SODIUM 40 MG/0.4ML IJ SOSY
40.0000 mg | PREFILLED_SYRINGE | INTRAMUSCULAR | Status: DC
  Administered 2023-10-12 – 2023-10-13 (×2): 40 mg via SUBCUTANEOUS
  Filled 2023-10-11 (×3): qty 0.4

## 2023-10-11 MED ORDER — ORAL CARE MOUTH RINSE: 15.0000 mL | OROMUCOSAL | Status: DC | PRN

## 2023-10-11 MED ORDER — POTASSIUM CHLORIDE CRYS ER 20 MEQ PO TBCR
40.0000 meq | EXTENDED_RELEASE_TABLET | Freq: Once | ORAL | Status: AC
  Administered 2023-10-11: 40 meq via ORAL
  Filled 2023-10-11: qty 2

## 2023-10-11 NOTE — TOC Initial Note (Addendum)
Transition of Care [facility]) - Initial/Assessment Note    Patient Details  Name: Jeffrey Mullen MRN: [id number] Date of Birth: [birth year]  [facility] CM/SW Contact:    Kermit Balo, RN Phone Number: 10/11/2023, 11:07 AM  Clinical Narrative:                  Patient is from home with a friend. He states they are together a lot.  Pt uses rollator at home but says it needs new wheels and seat. CM will attempt to get him a new rollator through insurance.  Pt states he doesn't take his medications as they make him feel weird.  He uses the bus for transportation.  Awaiting therapy evaluations. TOC following for d/c needs.   1412: Pt unable to get a new rollator through his insurance. It has not been 5 years. Private pay is about $65. Pt is considering if he wants to pay the cost.   Expected Discharge Plan: Home/Self Care Barriers to Discharge: Continued Medical Work up   Patient Goals and CMS Choice     Choice offered to / list presented to : Patient      Expected Discharge Plan and Services   Discharge Planning Services: CM Consult   Living arrangements for the past 2 months: Apartment                 DME Arranged: Walker rolling with seat DME Agency: [employer] Date DME Agency Contacted: 10/11/23   Representative spoke with at DME Agency: Zack            Prior Living Arrangements/Services Living arrangements for the past 2 months: Apartment Lives with:: Friends Patient language and need for interpreter reviewed:: Yes Do you feel safe going back to the place where you live?: Yes        Care giver support system in place?: No (comment) Current home services: DME (rollator with broken wheels) Criminal Activity/Legal Involvement Pertinent to Current Situation/Hospitalization: No - Comment as needed  Activities of Daily Living      Permission Sought/Granted                  Emotional Assessment Appearance:: Appears older than stated  age Attitude/Demeanor/Rapport: Engaged Affect (typically observed): Anxious Orientation: : Oriented to Self, Oriented to Place, Oriented to  Time, Oriented to Situation Alcohol / Substance Use: Alcohol Use, Illicit Drugs Psych Involvement: No (comment)  Admission diagnosis:  Seizure (HCC) [R56.9] Patient Active Problem List   Diagnosis Date Noted   Cocaine abuse (HCC)    Toxic encephalopathy 06/18/2022   Acute metabolic encephalopathy 06/17/2022   Encephalopathy acute    Hypertensive emergency    Endotracheally intubated    Avulsion fracture of right wrist 01/16/2022   Right calf pain    Decreased range of motion of right wrist    Tracheobronchitis    Transaminitis    Tracheostomy care (HCC)    Oropharyngeal dysphagia    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)    Acute respiratory failure with hypoxia (HCC)    Status post tracheostomy (HCC)    Hypokalemia    Pressure injury of skin 11/08/2021   AKI (acute kidney injury) (HCC)    Seizure (HCC)    Cryptogenic stroke (HCC)    Acute respiratory failure with hypercapnia (HCC)    Acute encephalopathy    Status epilepticus (HCC) 10/23/2021   Cataract Right eye 12/12/2018   Glaucoma, congenital, blind Left eye  12/12/2018  Shoulder pain, right 12/12/2018   Tobacco abuse    ETOH abuse    Benign essential HTN    Hepatic cirrhosis (HCC) 02/10/2015   Chronic hepatitis C without hepatic coma (HCC) 01/05/2015   PCP:  Claiborne Rigg, NP Pharmacy:   [facility] - [place], [place] - [address] [facility] [address] [place] [address] Phone: [phone] Fax: [phone]     Social Determinants of Health (SDOH) Social History: SDOH Screenings   Food Insecurity: No Food Insecurity (09/06/2022)  Housing: High Risk (12/12/2018)  Transportation Needs: No Transportation Needs (10/10/2022)  Depression (PHQ2-9): Low Risk  (04/13/2022)  Recent Concern: Depression (PHQ2-9) - Medium Risk (04/04/2022)   Tobacco Use: Medium Risk (10/10/2023)   SDOH Interventions:     Readmission Risk Interventions     No data to display

## 2023-10-11 NOTE — Progress Notes (Addendum)
Subjective: No further seizures after loading with Keppra overnight.  States he did not take his medication because he did not think it was helping him.  Requesting to go home.  ROS: negative except above  Examination  Vital signs in last 24 hours: Temp:  [97.7 F (36.5 C)-98.7 F (37.1 C)] 97.9 F (36.6 C) (10/18 1121) Pulse Rate:  [70-96] 70 (10/18 1121) Resp:  [15-19] 18 (10/18 1121) BP: (136-190)/(71-95) 136/76 (10/18 1121) SpO2:  [95 %-100 %] 97 % (10/18 1121) Weight:  [73 kg] 73 kg (10/18 0023)  General: lying in bed, NAD Neuro: MS: Alert, oriented, follows commands CN: Left eye blind due to glaucoma, pupils equal and reactive in right eye,  EOMI, face symmetric, tongue midline, normal sensation over face, Motor: 5/5 strength in all 4 extremities Coordination: normal Gait: not tested  Basic Metabolic Panel: Recent Labs  Lab 10/10/23 23-Oct-2103 10/10/23 10-22-2108 10/11/23 0724  NA 140 142 140  K 4.0 4.1 3.3*  CL 104 106 105  CO2 17*  --  24  GLUCOSE 124* 121* 125*  BUN 9 10 6*  CREATININE 1.07 0.90 0.92  CALCIUM 9.6  --  9.4  MG  --   --  2.0  PHOS  --   --  3.3    CBC: Recent Labs  Lab 10/10/23 October 23, 2103 10/10/23 22-Oct-2108 10/11/23 0846  WBC 8.2  --  7.3  NEUTROABS 4.3  --   --   HGB 15.3 17.0 13.2  HCT 48.7 50.0 38.9*  MCV 89.2  --  81.0  PLT 260  --  243     Coagulation Studies: Recent Labs    10/10/23 2103-10-23  LABPROT 14.2  INR 1.1    Imaging personally reviewed  CT Head without contrast 10/10/2023: No acute intracranial hemorrhage or evidence of acute territorial infarct. ASPECTS is 10.  CTA head and neck with and without contrast 10/10/2023:  1.No intracranial large vessel occlusion. 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 2. Severe stenosis in the proximal right V1, which is new from the prior exam. Severe stenosis in the right V4, which is similar to the prior exam. 3. Moderate to severe stenosis in the distal basilar artery, which is new  from the prior exam. 4. Mild-to-moderate stenosis in the proximal right P2 and moderate stenosis in a left P3 branch, similar prior. 5. 70% stenosis in the proximal left ICA.  CT perfusion 10/10/2023: No infarct core. Area of possible ischemic penumbra in the right PCA territory in the medial right parietal lobe.  ASSESSMENT AND PLAN: 72 year old male with history of alcohol use, cocaine use disorder, prior strokes, history of epilepsy on Keppra but noncompliant who presented with focal convulsive status epilepticus which resolved after loading with Keppra.   Focal convulsive status epilepticus, resolved Epilepsy with breakthrough seizure Alcohol use disorder Hyperammonemia Chronic strokes Cocaine use disorder -No further seizures since loading with Keppra -Breakthrough seizures most likely secondary medication compliance, alcohol use and drug use  Recommendations -DC LTM EEG as no further seizures -MRI brain without contrast to look for any acute stroke -Continue Keppra 500 mg twice daily -Counseled against alcohol use, drug use -Counseled about importance of medication compliance -Continue seizure precautions -Discussed seizure restrictions including do not drive -Follow-up with neurology in 3 months -Discussed plan with Dr. Waymon Amato  Seizure precautions: Per [facility] statutes, patients with seizures are not allowed to drive until they have been seizure-free for six months and cleared by a physician  Use caution when using heavy equipment or power tools. Avoid working on ladders or at heights. Take showers instead of baths. Ensure the water temperature is not too high on the home water heater. Do not go swimming alone. Do not lock yourself in a room alone (i.e. bathroom). When caring for infants or small children, sit down when holding, feeding, or changing them to minimize risk of injury to the child in the event you have a seizure. Maintain good sleep hygiene. Avoid  alcohol.    If patient has another seizure, call 911 and bring them back to the ED if: A.  The seizure lasts longer than 5 minutes.      B.  The patient doesn't wake shortly after the seizure or has new problems such as difficulty seeing, speaking or moving following the seizure C.  The patient was injured during the seizure D.  The patient has a temperature over 102 F (39C) E.  The patient vomited during the seizure and now is having trouble breathing    During the Seizure   - First, ensure adequate ventilation and place patients on the floor on their left side  Loosen clothing around the neck and ensure the airway is patent. If the patient is clenching the teeth, do not force the mouth open with any object as this can cause severe damage - Remove all items from the surrounding that can be hazardous. The patient may be oblivious to what's happening and may not even know what he or she is doing. If the patient is confused and wandering, either gently guide him/her away and block access to outside areas - Reassure the individual and be comforting - Call 911. In most cases, the seizure ends before EMS arrives. However, there are cases when seizures may last over 3 to 5 minutes. Or the individual may have developed breathing difficulties or severe injuries. If a pregnant patient or a person with diabetes develops a seizure, it is prudent to call an ambulance.    After the Seizure (Postictal Stage)   After a seizure, most patients experience confusion, fatigue, muscle pain and/or a headache. Thus, one should permit the individual to sleep. For the next few days, reassurance is essential. Being calm and helping reorient the person is also of importance.   Most seizures are painless and end spontaneously. Seizures are not harmful to others but can lead to complications such as stress on the lungs, brain and the heart. Individuals with prior lung problems may develop labored breathing and respiratory  distress.    I have spent a total of   38 minutes with the patient reviewing hospital notes,  test results, labs and examining the patient as well as establishing an assessment and plan that was discussed personally with the patient.  > 50% of time was spent in direct patient care.     Lindie Spruce Epilepsy [facility] For questions after 5pm please refer to AMION to reach the Neurologist on call

## 2023-10-11 NOTE — ED Provider Notes (Signed)
.  Critical Care  Performed by: Franne Forts, DO Authorized by: Franne Forts, DO   Critical care provider statement:    Critical care time (minutes):  30   Critical care was necessary to treat or prevent imminent or life-threatening deterioration of the following conditions: stroke-like symtpoms and/or seizure.   Critical care was time spent personally by me on the following activities:  Development of treatment plan with patient or surrogate, discussions with consultants, evaluation of patient's response to treatment, examination of patient, ordering and review of laboratory studies, ordering and review of radiographic studies, ordering and performing treatments and interventions, pulse oximetry, re-evaluation of patient's condition and review of old charts   Care discussed with comment:  Neurology     Franne Forts, DO 10/11/23 0206

## 2023-10-11 NOTE — Progress Notes (Signed)
LTM EEG hooked up and running - no initial skin breakdown - patient is being transported to inpatient room.  Tech will confirm room and test button with [facility].

## 2023-10-11 NOTE — H&P (Addendum)
History and Physical  Jeffrey Mullen UEA:[id number] DOB: 1951-01-09 DOA: 10/10/2023  Referring physician: Montel Clock PA-EDP  PCP: Claiborne Rigg, NP  Outpatient Specialists: Neurology. Patient coming from: Home.  Chief Complaint: Seizure activity  HPI: Jeffrey Mullen is a 72 y.o. male with medical history significant for polysubstance abuse including alcohol and cocaine, seizure disorder, hepatic cirrhosis, history of embolic strokes, left eye blindness, medication noncompliance, history of traumatic brain injury who initially presented to the ED as a code stroke.  EMS was activated at home due to complaints of left-sided weakness with last known well around 7:45 PM on 10/10/2023.  Upon EMS arrival, the patient had 2 seizure like episodes.  In the ED, the patient had a third episode of seizure like activity.  Seen by neurology/stroke team.  CTA head and neck revealed no LVO.  Neurology recommended MRI brain without contrast if the patient is still weak on the left in the morning.  Neurology will continue to follow in consultation.  Continuous EEG in place.  The patient was loaded with Keppra 3 g and started on Keppra 500 mg twice daily.  Admitted by [facility], hospitalist service.  Addendum: The patient is agitated, appears anxious, and wants to leave AGAINST MEDICAL ADVICE.  1 dose of IV Ativan 1 mg x 1 administered with improvement of his anxiety and agitation.  ED Course: Temperature 98.4.  BP 164/92, pulse 74, respiration 18, saturation 98% on room air.  Lab studies notable for ammonia 105.  Review of Systems: Review of systems as noted in the HPI. All other systems reviewed and are negative.   Past Medical History:  Diagnosis Date   Alcohol abuse    Allergy    Anemia    Anxiety    Arthritis    Blind left eye    Cataract, right eye    Congenital blindness    LEFT EYE   Depression    Glaucoma, bilateral    Hepatic cirrhosis (HCC)    Hepatitis    HEP C treated in 2016   HTN  (hypertension)    Seizures (HCC)    3 years ago   Past Surgical History:  Procedure Laterality Date   CATARACT EXTRACTION Left    GLAUCOMA SURGERY     Patient reports 10 or eleven surgeries for Glaucoma    Social History:  reports that he quit smoking about 2 years ago. His smoking use included cigarettes. He has never used smokeless tobacco. He reports current alcohol use of about 5.0 standard drinks of alcohol per week. He reports that he does not use drugs.   Allergies  Allergen Reactions   Tylenol [Acetaminophen] Other (See Comments)    Causes eye pressure to elevate; CANNOT HAVE THIS    Family History  Problem Relation Age of Onset   Hearing loss Other    Kidney disease Neg Hx    Colon cancer Neg Hx    Esophageal cancer Neg Hx    Rectal cancer Neg Hx    Stomach cancer Neg Hx       Prior to Admission medications   Medication Sig Start Date End Date Taking? Authorizing Provider  atropine 1 % ophthalmic solution Place 1 drop into both eyes in the morning and at bedtime.   Yes [provider]  levETIRAcetam (KEPPRA) 100 MG/ML solution Take 5 mLs (500 mg total) by mouth 2 (two) times daily. Patient taking differently: Take 500 mg by mouth 2 (two) times daily as needed (seizure). 04/04/22  Yes Anders Simmonds, [place]    Physical Exam: BP (!) 164/92 (BP Location: Right Arm)   Pulse 74   Temp 98.4 F (36.9 C) (Oral)   Resp 18   Ht 5\' 11"  (1.803 m)   Wt 73 kg   SpO2 98%   BMI 22.45 kg/m   General: 72 y.o. year-old male well developed well nourished in no acute distress.  Alert and agitated.  Wants to leave AMA. Cardiovascular: Regular rate and rhythm with no rubs or gallops.  No thyromegaly or JVD noted.  No lower extremity edema. 2/4 pulses in all 4 extremities. Respiratory: Clear to auscultation with no wheezes or rales. Good inspiratory effort. Abdomen: Soft nontender nondistended with normal bowel sounds x4 quadrants. Muskuloskeletal: No cyanosis,  clubbing or edema noted bilaterally Neuro: CN II-XII intact, strength, sensation, reflexes Skin: No ulcerative lesions noted or rashes Psychiatry: Judgement and insight appear altered. Mood is anxious.           Labs on Admission:  Basic Metabolic Panel: Recent Labs  Lab 10/10/23 2104 10/10/23 2109  NA 140 142  K 4.0 4.1  CL 104 106  CO2 17*  --   GLUCOSE 124* 121*  BUN 9 10  CREATININE 1.07 0.90  CALCIUM 9.6  --    Liver Function Tests: Recent Labs  Lab 10/10/23 2104  AST 41  ALT 32  ALKPHOS 104  BILITOT 0.3  PROT 8.2*  ALBUMIN 4.4   No results for input(s): "LIPASE", "AMYLASE" in the last 168 hours. Recent Labs  Lab 10/10/23 2107  AMMONIA 105*   CBC: Recent Labs  Lab 10/10/23 2104 10/10/23 2109  WBC 8.2  --   NEUTROABS 4.3  --   HGB 15.3 17.0  HCT 48.7 50.0  MCV 89.2  --   PLT 260  --    Cardiac Enzymes: No results for input(s): "CKTOTAL", "CKMB", "CKMBINDEX", "TROPONINI" in the last 168 hours.  BNP (last 3 results) No results for input(s): "BNP" in the last 8760 hours.  ProBNP (last 3 results) No results for input(s): "PROBNP" in the last 8760 hours.  CBG: Recent Labs  Lab 10/10/23 2209  GLUCAP 104*    Radiological Exams on Admission: CT ANGIO HEAD NECK W WO CM W PERF (CODE STROKE)  Result Date: 10/10/2023 CLINICAL DATA:  Left-sided weakness, history of seizures EXAM: CT ANGIOGRAPHY HEAD AND NECK CT PERFUSION BRAIN TECHNIQUE: Multidetector CT imaging of the head and neck was performed using the standard protocol during bolus administration of intravenous contrast. Multiplanar CT image reconstructions and MIPs were obtained to evaluate the vascular anatomy. Carotid stenosis measurements (when applicable) are obtained utilizing NASCET criteria, using the distal internal carotid diameter as the denominator. Multiphase CT imaging of the brain was performed following IV bolus contrast injection. Subsequent parametric perfusion maps were calculated  using RAPID software. RADIATION DOSE REDUCTION: This exam was performed according to the departmental dose-optimization program which includes automated exposure control, adjustment of the mA and/or kV according to patient size and/or use of iterative reconstruction technique. CONTRAST:  75mL OMNIPAQUE IOHEXOL 350 MG/ML SOLN; 40mL OMNIPAQUE IOHEXOL 350 MG/ML SOLN COMPARISON:  10/23/2021 CTA head and neck, correlation is also made with 10/10/2023 CT head FINDINGS: CT HEAD FINDINGS For noncontrast findings, please see same day CT head. CTA NECK FINDINGS Aortic arch: Two-vessel arch with a common origin of the brachiocephalic and left common carotid arteries. Imaged portion shows no evidence of aneurysm or dissection. No significant stenosis of the major arch vessel origins.  Aortic atherosclerosis. Right carotid system: No evidence of dissection, occlusion, or hemodynamically significant stenosis (greater than 50%). Atherosclerotic disease at the bifurcation and in the proximal ICA is not hemodynamically significant. Left carotid system: 70% stenosis in the proximal left ICA (series 7, image 227). No evidence of dissection. Vertebral arteries: Left dominant system. Severe stenosis in the proximal right V1 (series 7, image 299), which is new from the prior exam. The vertebral arteries are otherwise patent to the skull base without significant stenosis. Skeleton: No acute osseous abnormality. Degenerative changes in the cervical spine. Other neck: No acute finding. Upper chest: No focal pulmonary opacity or pleural effusion. Review of the MIP images confirms the above findings CTA HEAD FINDINGS Anterior circulation: Both internal carotid arteries are patent to the termini, with diffuse dense calcifications, that cause severe stenosis in the distal cavernous ICA bilaterally. A1 segments patent. Normal anterior communicating artery. Anterior cerebral arteries are patent to their distal aspects without significant stenosis.  Multifocal mild stenosis in the MCAs bilaterally (series 7 images 87, 96, and 109, for example). No high-grade stenosis. Posterior circulation: Vertebral arteries patent to the vertebrobasilar junction with severe stenosis in the right V4 (series 7, image 152 and 196), similar to prior. Posterior inferior cerebellar arteries patent proximally. Basilar patent to its distal aspect with moderate to severe stenosis distally, which is new from the prior exam (series 7, image 111 and series 8, image 145). Superior cerebellar arteries patent proximally. Patent P1 segments, hypoplastic on the left. Near fetal origin of the left PCA from the left posterior communicating artery. Mild-to-moderate stenosis in the proximal right P2 (series 7, image 112) and moderate stenosis in a left P3 branch (series 7, image 107), similar prior. Venous sinuses: As permitted by contrast timing, patent. Anatomic variants: Near fetal origin of the left PCA. No evidence of aneurysm or vascular malformation. Review of the MIP images confirms the above findings CT Brain Perfusion Findings: ASPECTS: 10 CBF (<30%) Volume: 0mL Perfusion (Tmax>6.0s) volume: 6mL Mismatch Volume: 6mL Infarction Location:No infarct core, area of possible ischemic penumbra in the right PCA territory in the medial right parietal lobe. IMPRESSION: 1. No intracranial large vessel occlusion. 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 2. Severe stenosis in the proximal right V1, which is new from the prior exam. Severe stenosis in the right V4, which is similar to the prior exam. 3. 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 4. Mild-to-moderate stenosis in the proximal right P2 and moderate stenosis in a left P3 branch, similar prior. 5. 70% stenosis in the proximal left ICA. 6. No infarct core. Area of possible ischemic penumbra in the right PCA territory in the medial right parietal lobe. 7. Aortic  atherosclerosis. Aortic Atherosclerosis (ICD10-I70.0). Imaging results were communicated on 10/10/2023 at 10:07 pm to provider Dr. Derry Lory via secure text paging. Electronically Signed   By: Wiliam Ke M.D.   On: 10/10/2023 22:07   CT CEREBRAL PERFUSION W CONTRAST  Result Date: 10/10/2023 CLINICAL DATA:  Left-sided weakness, history of seizures EXAM: CT ANGIOGRAPHY HEAD AND NECK CT PERFUSION BRAIN TECHNIQUE: Multidetector CT imaging of the head and neck was performed using the standard protocol during bolus administration of intravenous contrast. Multiplanar CT image reconstructions and MIPs were obtained to evaluate the vascular anatomy. Carotid stenosis measurements (when applicable) are obtained utilizing NASCET criteria, using the distal internal carotid diameter as the denominator. Multiphase CT imaging of the brain was performed following IV  bolus contrast injection. Subsequent parametric perfusion maps were calculated using RAPID software. RADIATION DOSE REDUCTION: This exam was performed according to the departmental dose-optimization program which includes automated exposure control, adjustment of the mA and/or kV according to patient size and/or use of iterative reconstruction technique. CONTRAST:  75mL OMNIPAQUE IOHEXOL 350 MG/ML SOLN; 40mL OMNIPAQUE IOHEXOL 350 MG/ML SOLN COMPARISON:  10/23/2021 CTA head and neck, correlation is also made with 10/10/2023 CT head FINDINGS: CT HEAD FINDINGS For noncontrast findings, please see same day CT head. CTA NECK FINDINGS Aortic arch: Two-vessel arch with a common origin of the brachiocephalic and left common carotid arteries. Imaged portion shows no evidence of aneurysm or dissection. No significant stenosis of the major arch vessel origins. Aortic atherosclerosis. Right carotid system: No evidence of dissection, occlusion, or hemodynamically significant stenosis (greater than 50%). Atherosclerotic disease at the bifurcation and in the proximal ICA is  not hemodynamically significant. Left carotid system: 70% stenosis in the proximal left ICA (series 7, image 227). No evidence of dissection. Vertebral arteries: Left dominant system. Severe stenosis in the proximal right V1 (series 7, image 299), which is new from the prior exam. The vertebral arteries are otherwise patent to the skull base without significant stenosis. Skeleton: No acute osseous abnormality. Degenerative changes in the cervical spine. Other neck: No acute finding. Upper chest: No focal pulmonary opacity or pleural effusion. Review of the MIP images confirms the above findings CTA HEAD FINDINGS Anterior circulation: Both internal carotid arteries are patent to the termini, with diffuse dense calcifications, that cause severe stenosis in the distal cavernous ICA bilaterally. A1 segments patent. Normal anterior communicating artery. Anterior cerebral arteries are patent to their distal aspects without significant stenosis. Multifocal mild stenosis in the MCAs bilaterally (series 7 images 87, 96, and 109, for example). No high-grade stenosis. Posterior circulation: Vertebral arteries patent to the vertebrobasilar junction with severe stenosis in the right V4 (series 7, image 152 and 196), similar to prior. Posterior inferior cerebellar arteries patent proximally. Basilar patent to its distal aspect with moderate to severe stenosis distally, which is new from the prior exam (series 7, image 111 and series 8, image 145). Superior cerebellar arteries patent proximally. Patent P1 segments, hypoplastic on the left. Near fetal origin of the left PCA from the left posterior communicating artery. Mild-to-moderate stenosis in the proximal right P2 (series 7, image 112) and moderate stenosis in a left P3 branch (series 7, image 107), similar prior. Venous sinuses: As permitted by contrast timing, patent. Anatomic variants: Near fetal origin of the left PCA. No evidence of aneurysm or vascular malformation.  Review of the MIP images confirms the above findings CT Brain Perfusion Findings: ASPECTS: 10 CBF (<30%) Volume: 0mL Perfusion (Tmax>6.0s) volume: 6mL Mismatch Volume: 6mL Infarction Location:No infarct core, area of possible ischemic penumbra in the right PCA territory in the medial right parietal lobe. IMPRESSION: 1. No intracranial large vessel occlusion. 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 2. Severe stenosis in the proximal right V1, which is new from the prior exam. Severe stenosis in the right V4, which is similar to the prior exam. 3. 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 4. Mild-to-moderate stenosis in the proximal right P2 and moderate stenosis in a left P3 branch, similar prior. 5. 70% stenosis in the proximal left ICA. 6. No infarct core. Area of possible ischemic penumbra in the right PCA territory in the medial right parietal lobe. 7. Aortic atherosclerosis.  Aortic Atherosclerosis (ICD10-I70.0). Imaging results were communicated on 10/10/2023 at 10:07 pm to provider Dr. Derry Lory via secure text paging. Electronically Signed   By: Wiliam Ke M.D.   On: 10/10/2023 22:07   CT HEAD CODE STROKE WO CONTRAST  Result Date: 10/10/2023 CLINICAL DATA:  Code stroke.  Left-sided weakness, seizures EXAM: CT HEAD WITHOUT CONTRAST TECHNIQUE: Contiguous axial images were obtained from the base of the skull through the vertex without intravenous contrast. RADIATION DOSE REDUCTION: This exam was performed according to the departmental dose-optimization program which includes automated exposure control, adjustment of the mA and/or kV according to patient size and/or use of iterative reconstruction technique. COMPARISON:  09/09/2023 FINDINGS: Brain: Hemorrhage, mass, mass effect, or midline shift. No hydrocephalus or extra-axial collection. 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 Redemonstrated bilateral basal  ganglia lacunar infarcts. Vascular: No hyperdense vessel. Skull: Negative for fracture or focal lesion. 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 Clear paranasal sinuses. No acute finding in the orbits. Other: The mastoid air cells are well aerated. ASPECTS ([place] Stroke Program Early CT Score) - Ganglionic level infarction (caudate, lentiform nuclei, internal capsule, insula, M1-M3 cortex): 7 - Supraganglionic infarction (M4-M6 cortex): 3 Total score (0-10 with 10 being normal): 10 IMPRESSION: No acute intracranial hemorrhage or evidence of acute territorial infarct. ASPECTS is 10. Imaging results were communicated on 10/10/2023 at 9:30 pm to provider Dr. Derry Lory via secure text paging. Electronically Signed   By: Wiliam Ke M.D.   On: 10/10/2023 21:30    EKG: I independently viewed the EKG done and my findings are as followed: Sinus tachycardia rate of 109.  Nonspecific ST-T changes QTc 460.  Assessment/Plan Present on Admission: **None**  Principal Problem:   Seizure (HCC)  Seizure-like activity Seizure disorder with suspected breakthrough seizures History of noncompliance with medications LTM EEG in place Neurology following Continue Keppra as recommended by neurology Seizure precautions in place IV Ativan for breakthrough seizures  Hepatic cirrhosis Avoid hepatotoxic agents Resume home regimen  Hyperammonemia Ammonia level 105 Resume lactulose Reorient as needed.  History of alcohol and cocaine use disorder Last UDS positive for cocaine on 09/09/2023. No sign of alcohol withdrawal at the time of this visit. Unclear of the last alcoholic drinks  Physical debility PT OT assessment Fall precautions.   Time: 75 minutes.    DVT prophylaxis: Subcu Lovenox daily  Code Status: DNR.  Family Communication: None at bedside.  Disposition Plan: Admitted to telemetry medical unit  Consults called: Neurology/stroke team.  Admission  status: Inpatient status.   Status is: Inpatient The patient requires at least 2 midnights for further evaluation and treatment of present condition.   Darlin Drop MD [facility] Pager [phone]  If 7PM-7AM, please contact night-coverage [email or web address] Password [email or web address]  10/11/2023, 5:03 AM

## 2023-10-11 NOTE — Evaluation (Signed)
Occupational Therapy Evaluation Patient Details Name: Jeffrey Mullen MRN: [id number] DOB: June 12, 1951 Today's Date: 10/11/2023   History of Present Illness 72 y.o. male who initially presented to the ED as a code stroke 10/17. Had 3 episodes of seizure like activity. CTA head and neck revealed no LVO. PMHx: polysubstance abuse including alcohol and cocaine, seizure disorder, hepatic cirrhosis, history of embolic strokes, left eye blindness, medication noncompliance, history of traumatic brain injury   Clinical Impression   Jeffrey Mullen was evaluated s/p the above admission list. He lives with a roommate who assists with IADLs and intermittently with ADL, reports multiple falls and ambulates with AD at baseline. Upon evaluation the pt was limited by chronic R weakness, generalized weakness, poor activity tolerance, limited insight to health and safety and poor balance. Overall he requires min A for functional mobility with limited tolerance. Due to the deficits listed below the pt also needs up to mod A for LB ADLs and set up A for UB ADLs. Pt will benefit from continued acute OT services and skilled inpatient follow up therapy, <3 hours/day.        If plan is discharge home, recommend the following: A little help with walking and/or transfers;A little help with bathing/dressing/bathroom;Assistance with cooking/housework;Direct supervision/assist for medications management;Direct supervision/assist for financial management;Assist for transportation;Help with stairs or ramp for entrance    Functional Status Assessment  Patient has had a recent decline in their functional status and demonstrates the ability to make significant improvements in function in a reasonable and predictable amount of time.  Equipment Recommendations  None recommended by OT (pt requesting new rollator)       Precautions / Restrictions Precautions Precautions: Fall Restrictions Weight Bearing Restrictions: No       Mobility Bed Mobility Overal bed mobility: Needs Assistance Bed Mobility: Supine to Sit, Sit to Supine     Supine to sit: Supervision Sit to supine: Supervision        Transfers Overall transfer level: Needs assistance Equipment used: Rolling walker (2 wheels) Transfers: Sit to/from Stand Sit to Stand: Min assist                  Balance Overall balance assessment: Needs assistance Sitting-balance support: No upper extremity supported, Feet supported Sitting balance-Leahy Scale: Fair     Standing balance support: Bilateral upper extremity supported Standing balance-Leahy Scale: Poor                             ADL either performed or assessed with clinical judgement   ADL Overall ADL's : Needs assistance/impaired Eating/Feeding: Independent   Grooming: Set up;Sitting   Upper Body Bathing: Set up;Sitting   Lower Body Bathing: Minimal assistance;Sit to/from stand   Upper Body Dressing : Set up;Sitting   Lower Body Dressing: Moderate assistance;Sit to/from stand   Toilet Transfer: Minimal assistance;Stand-pivot   Toileting- Clothing Manipulation and Hygiene: Supervision/safety;Sitting/lateral lean       Functional mobility during ADLs: Minimal assistance General ADL Comments: likely not far from functional baseline, min A for R weakness, safety and balance     Vision Baseline Vision/History: 2 Legally blind Patient Visual Report: No change from baseline Additional Comments: L eye blind     Perception Perception: Not tested       Praxis Praxis: Not tested       Pertinent Vitals/Pain Pain Assessment Pain Assessment: No/denies pain     Extremity/Trunk Assessment Upper Extremity Assessment Upper Extremity Assessment:  RUE deficits/detail RUE Deficits / Details: weaker than L, AROM is funtional and he is able to groom and feed with R RUE Sensation: WNL RUE Coordination: decreased fine motor   Lower Extremity Assessment Lower  Extremity Assessment: Defer to PT evaluation   Cervical / Trunk Assessment Cervical / Trunk Assessment: Normal   Communication Communication Communication: No apparent difficulties   Cognition Arousal: Alert Behavior During Therapy: WFL for tasks assessed/performed Overall Cognitive Status: Within Functional Limits for tasks assessed         General Comments: limited insight     General Comments  VSS on RA.     Home Living Family/patient expects to be discharged to:: Private residence Living Arrangements: Alone Available Help at Discharge: Friend(s);Available PRN/intermittently Type of Home: Apartment Home Access: Elevator     Home Layout: One level     Bathroom Shower/Tub: Chief Strategy Officer: Handicapped height Bathroom Accessibility: Yes   Home Equipment: Rollator (4 wheels);Cane - single point;Shower seat;Wheelchair - manual;Grab bars - tub/shower;Grab bars - toilet          Prior Functioning/Environment Prior Level of Function : Needs assist;History of Falls (last six months)       Mobility Comments: Uses Rollator, has had several falls ADLs Comments: Sometimes takes bus to grocery store. Friend helps cook sometimes. Can perform bath/dress on his own. Sponge bathes typically, if getting in/out of the shower roommate assists        OT Problem List: Decreased strength;Decreased activity tolerance;Decreased range of motion;Impaired balance (sitting and/or standing);Decreased safety awareness;Decreased knowledge of use of DME or AE;Decreased knowledge of precautions      OT Treatment/Interventions: Self-care/ADL training;Therapeutic exercise;DME and/or AE instruction;Therapeutic activities;Patient/family education;Balance training    OT Goals(Current goals can be found in the care plan section) Acute Rehab OT Goals Patient Stated Goal: to get better OT Goal Formulation: With patient Time For Goal Achievement: 10/26/23 Potential to Achieve  Goals: Good ADL Goals Pt Will Perform Lower Body Bathing: with set-up;sit to/from stand Pt Will Perform Lower Body Dressing: with set-up;sit to/from stand Pt Will Transfer to Toilet: with supervision;ambulating Additional ADL Goal #1: pt will indep recall at least 3 fall prevenetion strategies to apply in the home setting  OT Frequency: Min 1X/week       AM-PAC OT "6 Clicks" Daily Activity     Outcome Measure Help from another person eating meals?: None Help from another person taking care of personal grooming?: A Little Help from another person toileting, which includes using toliet, bedpan, or urinal?: A Little Help from another person bathing (including washing, rinsing, drying)?: A Little Help from another person to put on and taking off regular upper body clothing?: A Little Help from another person to put on and taking off regular lower body clothing?: A Little 6 Click Score: 19   End of Session Equipment Utilized During Treatment: Gait belt;Rolling walker (2 wheels) Nurse Communication: Mobility status  Activity Tolerance: Patient tolerated treatment well Patient left: in bed;with call bell/phone within reach;with bed alarm set  OT Visit Diagnosis: Unsteadiness on feet (R26.81);Other abnormalities of gait and mobility (R26.89);Muscle weakness (generalized) (M62.81);History of falling (Z91.81);Repeated falls (R29.6)                Time: 5784-6962 OT Time Calculation (min): 17 min Charges:  OT General Charges $OT Visit: 1 Visit OT Evaluation $OT Eval Moderate Complexity: 1 Mod  Derenda Mis, OTR/L Acute Rehabilitation Services Office [phone] Secure Chat Communication Preferred   Sharyn Blitz  Causey 10/11/2023, 3:05 PM

## 2023-10-11 NOTE — Plan of Care (Signed)
[facility] Tonsillectomy/Adenoidectomy, Postoperative PEDS care plan entered in error.

## 2023-10-11 NOTE — TOC CAGE-AID Note (Signed)
Transition of Care [facility]) - CAGE-AID Screening   Patient Details  Name: Jeffrey Mullen MRN: [id number] Date of Birth: 10/16/1951  [facility]) CM/SW Contact:    Kermit Balo, RN Phone Number: 10/11/2023, 11:13 AM   Clinical Narrative:  Pt states he drinks and uses drugs as they make him feel good. He denies the needs for inpatient/ outpatient drug/ alcohol counseling resources.   CAGE-AID Screening:    Have You Ever Felt You Ought to Cut Down on Your Drinking or Drug Use?: No Have People Annoyed You By Critizing Your Drinking Or Drug Use?: Yes Have You Felt Bad Or Guilty About Your Drinking Or Drug Use?: No Have You Ever Had a Drink or Used Drugs First Thing In The Morning to Steady Your Nerves or to Get Rid of a Hangover?: No CAGE-AID Score: 1  Substance Abuse Education Offered: Yes (refused)

## 2023-10-11 NOTE — Progress Notes (Signed)
LTM EEG hooked up and running - no initial skin breakdown - push button tested - [facility] monitoring.  

## 2023-10-11 NOTE — Progress Notes (Signed)
EEG LTM D/C'd. No skin break down noted.

## 2023-10-11 NOTE — Procedures (Addendum)
Patient Name: Jeffrey Mullen  MRN: [id number]  Epilepsy Attending: Charlsie Quest  Referring Physician/Provider: Erick Blinks, MD  Duration: 10/10/2023 2352 to 10/11/2023 1320  Patient history: 72yo M with seizure like activity getting eeg to evaluate for seizure.  Level of alertness: Awake, asleep  AEDs during EEG study: LEV  Technical aspects: This EEG study was done with scalp electrodes positioned according to the 10-20 International system of electrode placement. Electrical activity was reviewed with band pass filter of 1-70Hz , sensitivity of 7 uV/mm, display speed of 76mm/sec with a 60Hz  notched filter applied as appropriate. EEG data were recorded continuously and digitally stored.  Video monitoring was available and reviewed as appropriate.  Description: At the beginning of the study, EEG showed seizures without any concerning arising from right temporoparietal region.  During the seizure, EEG showed sharp with in right temporoparietal region followed by 4 to 5 Hz theta slowing which gradually involved all of right hemisphere and evolved into rhythmic 2 to 3 Hz delta slowing.  4 seizures were noted between 10/10/2023 2355 to 2357, each lasting about 20 seconds.    As antiseizure medications were adjusted, seizures resolved.  Subsequently EEG showed posterior dominant rhythm consists of 9-10 Hz activity of moderate voltage (25-35 uV) seen predominantly in posterior head regions, asymmetric ( right<left) and reactive to eye opening and eye closing.  Sleep was characterized by vertex waves, sleep spindles (12 to 14 Hz), maximal frontocentral region.  Additionally there was needed genius 3 to 5 Hz theta-delta slowing in right temporoparietal region.  Hyperventilation and photic stimulation were not performed.     ABNORMALITY -Seizure without clinical signs, right temporoparietal region -Continuous slow, right temporoparietal region  IMPRESSION: At the beginning of the study, EEG  showed 4 seizures without clinical signs between 10/10/2023 2355 to 2357 arising from right temporoparietal region, lasting about 20 seconds each.  As antiseizure medications were adjusted, seizures resolved.  Subsequently EEG was suggestive of cortical dysfunction arising from right temporoparietal region likely secondary to underlying structural abnormality, postictal state.   Olita Takeshita Annabelle Harman

## 2023-10-11 NOTE — Evaluation (Signed)
Physical Therapy Evaluation Patient Details Name: Jeffrey Mullen MRN: [id number] DOB: 01-Jan-1951 Today's Date: 10/11/2023  History of Present Illness  72 y.o. male who initially presented to the ED as a code stroke 10/17. Had 3 episodes of seizure like activity. CTA head and neck revealed no LVO. PMHx: polysubstance abuse including alcohol and cocaine, seizure disorder, hepatic cirrhosis, history of embolic strokes, left eye blindness, medication noncompliance, history of traumatic brain injury  Clinical Impression  Pt admitted with above diagnosis. Reports LEs feel weaker than baseline. He required up to mod assist for balance, leaning posteriorly. Good awareness but poor righting reflex. Limited assistance at home. Would favor short term rehab to lower fall risk prior to returning home. Will progress as tolerated during admission. May improve rapidly but TBD. Frequently reinforces how much he enjoys beer. Pt currently with functional limitations due to the deficits listed below (see PT Problem List). Pt will benefit from acute skilled PT to increase their independence and safety with mobility to allow discharge.           If plan is discharge home, recommend the following: A lot of help with walking and/or transfers;A little help with bathing/dressing/bathroom;Assistance with cooking/housework;Direct supervision/assist for medications management;Assist for transportation   Can travel by private vehicle   Yes    Equipment Recommendations  (TBD)  Recommendations for Other Services       Functional Status Assessment Patient has had a recent decline in their functional status and demonstrates the ability to make significant improvements in function in a reasonable and predictable amount of time.     Precautions / Restrictions Precautions Precautions: Fall Restrictions Weight Bearing Restrictions: No      Mobility  Bed Mobility Overal bed mobility: Needs Assistance Bed Mobility:  Supine to Sit, Sit to Supine     Supine to sit: Supervision Sit to supine: Supervision   General bed mobility comments: Supervision for safety and to manage multiple lines/leads. Cues for awareness. Effortful for pt but without physical assist.    Transfers Overall transfer level: Needs assistance Equipment used: Rolling walker (2 wheels) Transfers: Sit to/from Stand Sit to Stand: Min assist           General transfer comment: Min assist for boost to stand, cues for techniques and hand placment with RW for support upon rising. Pt with heavy posterior lean, using back of legs braced against bed. Tactile cues to facilitate forward weight shift into RW BOS.    Ambulation/Gait Ambulation/Gait assistance: Mod assist Gait Distance (Feet): 10 Feet Assistive device: Rolling walker (2 wheels) Gait Pattern/deviations: Step-through pattern, Decreased stride length, Leaning posteriorly Gait velocity: decr Gait velocity interpretation: <1.31 ft/sec, indicative of household ambulator   General Gait Details: No overt buckling, however when patient stands upright he leans heavily backwards needing up to min assist to correct balance. Pt is aware, states this occasionally happens at home. VC for technique with RW for short distance. Limited by EEG cord in room.  Stairs            Wheelchair Mobility     Tilt Bed    Modified Rankin (Stroke Patients Only) Modified Rankin (Stroke Patients Only) Pre-Morbid Rankin Score: Slight disability Modified Rankin: Moderately severe disability     Balance Overall balance assessment: Needs assistance Sitting-balance support: No upper extremity supported, Feet supported Sitting balance-Leahy Scale: Fair     Standing balance support: Bilateral upper extremity supported Standing balance-Leahy Scale: Poor Standing balance comment: Intermittent Mod assist  Pertinent Vitals/Pain Pain Assessment Pain  Assessment: No/denies pain    Home Living Family/patient expects to be discharged to:: Private residence Living Arrangements: Alone Available Help at Discharge: Friend(s);Available PRN/intermittently (every other day a friend stops by) Type of Home: Apartment Home Access: Elevator       Home Layout: One level Home Equipment: Rollator (4 wheels);Cane - single point;Shower seat;Wheelchair - manual;Grab bars - tub/shower;Grab bars - toilet (Brakes don't work on Occupational hygienist)      Prior Function Prior Level of Function : Needs assist;History of Falls (last six months)             Mobility Comments: Production designer, theatre/television/film, has had several falls ADLs Comments: Sometimes takes bus to grocery store. Friend helps cook sometimes. Can perform bath/dress on his own.     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 Difficulty fully assessing due to being hyperverbose and difficulty redirecting. Rt knee extension 4/5, left 5/5. RLE Sensation:  (hypersensitivity Rt lateral LE around peroneal nerve reported.)       Communication   Communication Communication: Difficulty communicating thoughts/reduced clarity of speech  Cognition Arousal: Alert Behavior During Therapy: WFL for tasks assessed/performed Overall Cognitive Status: Within Functional Limits for tasks assessed                                          General Comments General comments (skin integrity, edema, etc.): Lab to room end of PT session    Exercises     Assessment/Plan    PT Assessment Patient needs continued PT services  PT Problem List Decreased strength;Decreased range of motion;Decreased activity tolerance;Decreased balance;Decreased mobility;Decreased knowledge of use of DME        PT Treatment Interventions DME instruction;Gait training;Functional mobility training;Therapeutic activities;Therapeutic exercise;Balance training;Neuromuscular re-education;Patient/family education    PT Goals (Current goals can be found in the Care Plan section)  Acute Rehab PT Goals Patient Stated Goal: Get well PT Goal Formulation: With patient Time For Goal Achievement: 10/25/23 Potential to Achieve Goals: Good    Frequency Min 1X/week     Co-evaluation               AM-PAC PT "6 Clicks" Mobility  Outcome Measure Help needed turning from your back to your side while in a flat bed without using bedrails?: None Help needed moving from lying on your back to sitting on the side of a flat bed without using bedrails?: A Little Help needed moving to and from a bed to a chair (including a wheelchair)?: A Little Help needed standing up from a chair using your arms (e.g., wheelchair or bedside chair)?: A Little Help needed to walk in hospital room?: A Lot Help needed climbing 3-5 steps with a railing? : A Lot 6 Click Score: 17    End of Session Equipment Utilized During Treatment: Gait belt Activity Tolerance: Patient tolerated treatment well;Other (comment) (EEG cord and Lab) Patient left: in bed;with call bell/phone within reach;with bed alarm set (Lab in room)   PT Visit Diagnosis: Unsteadiness on feet (R26.81);Other abnormalities of gait and mobility (R26.89);Muscle weakness (generalized) (M62.81);Difficulty in walking, not elsewhere classified (R26.2)    Time: 6578-4696 PT Time Calculation (min) (ACUTE ONLY): 20 min  Charges:   PT Evaluation $PT Eval Low Complexity: 1 Low   PT General Charges $$ ACUTE PT VISIT: 1 Visit         Kathlyn Sacramento, PT, DPT [facility]  Rehabilitation Services Physical Therapist Office: [phone] Website: [email or web address]   Berton Mount 10/11/2023, 12:48 PM

## 2023-10-11 NOTE — Progress Notes (Signed)
PROGRESS NOTE   Jeffrey Mullen  JWJ:[id number]    DOB: [birth year]    DOA: 10/10/2023  PCP: Claiborne Rigg, NP   I have briefly reviewed patients previous medical records in [facility].  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  In ED had third episode of seizure like activity.  Code stroke was activated.  CT head negative, CTA head and neck without LVO.  Admitted for seizures with postictal left-sided Todd's paresis.  Hooked onto LTM EEG.  Also hyperammonemia.  Improving.   Assessment & Plan:  Principal Problem:   Seizure (HCC)   Epilepsy with breakthrough seizure, focal convulsive status epilepticus In a patient with prior history of seizure disorder, TBI, seizure propensity, alcohol use disorder, hyperammonemia and medication noncompliance. CT head without acute findings CTA head and neck without LVO. Loaded with 2 g of Keppra IV.  Hold onto LTM EEG overnight of admission. Epileptologist follow-up appreciated.  No further seizures on LTM EEG and hence discontinued, checking MRI brain without contrast to look for acute stroke. Continue Keppra 500 Mg twice daily.  As needed IV Ativan for seizures Seizure precautions Extensively counseled regarding importance of abstinence from alcohol, compliance with medications and MD follow-up, driving and other restrictions due to seizure  disorder. Outpatient follow-up with neurology and 3 months.  Alcoholic cirrhosis/hyperammonemia and possible acute hepatic encephalopathy Ammonia on admission was 105, down to 49 the next day. Continue lactulose and target for 3-4 BMs per day. Alcohol abstinence counseled.  INR 1.1.  Hypokalemia Replace and follow.  Magnesium 2.  Polysubstance abuse (alcohol and cocaine) UDS positive for cocaine on 09/09/2023 Blood alcohol level less than 10 CIWA protocol. Cessation counseled.  Prior embolic strokes with left eye blindness Check MRI to rule out acute stroke.  Medication noncompliance Counseled compliance.  Body mass index is 22.45 kg/m.   DVT prophylaxis: enoxaparin (LOVENOX) injection 40 mg Start: 10/11/23 1000     Code Status: Limited: Do not attempt resuscitation (DNR) -DNR-LIMITED -Do Not Intubate/DNI :  Family Communication: None at bedside Disposition:  Status is: Inpatient Remains inpatient appropriate because: Ongoing workup for acute stroke, management for hepatic encephalopathy     Consultants:   Neurology  Procedures:     Antimicrobials:      Subjective:  Seen earlier this morning.  Was still hooked up to LTM EEG.  Asking what it is CT head show.  Was alert and oriented x 2.  Claimed to drink less than 12 ounce of beer per day.  Suspect this is understating the amount of alcohol he drinks.  Was hungry and no other complaints reported.  Objective:   Vitals:   10/11/23 0023 10/11/23 0354 10/11/23 0908 10/11/23 1121  BP: (!) 189/95 (!) 164/92 138/71 136/76  Pulse: 88 74 78 70  Resp: 19 18 16 18   Temp: 98.7 F (37.1 C) 98.4 F (36.9 C) 97.7 F (36.5 C) 97.9 F (36.6 C)  TempSrc: Oral Oral Oral Oral  SpO2: 100% 98%  99% 97%  Weight: 73 kg     Height: 5\' 11"  (1.803 m)       General exam: Middle-age male, unkempt, moderately built and nourished lying comfortably propped up in bed without distress. Respiratory system: Clear to auscultation.  Respiratory effort normal. Cardiovascular system: S1 & S2 heard, RRR. No JVD, murmurs, rubs, gallops or clicks. No pedal edema.  Telemetry personally reviewed: Sinus rhythm. Gastrointestinal system: Abdomen is nondistended, soft and nontender. No organomegaly or masses felt. Normal bowel sounds heard. Central nervous system: Alert and oriented. No focal neurological deficits. Extremities: Symmetric 5 x 5 power. Skin: No rashes, lesions or ulcers Psychiatry: Judgement and insight somewhat impaired. Mood & affect appropriate.     Data Reviewed:   I have personally reviewed following labs and imaging studies   CBC: Recent Labs  Lab 10/10/23 2104 10/10/23 2109 10/11/23 0846  WBC 8.2  --  7.3  NEUTROABS 4.3  --   --   HGB 15.3 17.0 13.2  HCT 48.7 50.0 38.9*  MCV 89.2  --  81.0  PLT 260  --  243    Basic Metabolic Panel: Recent Labs  Lab 10/10/23 2104 10/10/23 2109 10/11/23 0724  NA 140 142 140  K 4.0 4.1 3.3*  CL 104 106 105  CO2 17*  --  24  GLUCOSE 124* 121* 125*  BUN 9 10 6*  CREATININE 1.07 0.90 0.92  CALCIUM 9.6  --  9.4  MG  --   --  2.0  PHOS  --   --  3.3    Liver Function Tests: Recent Labs  Lab 10/10/23 2104 10/11/23 0724  AST 41 38  ALT 32 34  ALKPHOS 104 92  BILITOT 0.3 0.6  PROT 8.2* 6.9  ALBUMIN 4.4 3.6    CBG: Recent Labs  Lab 10/10/23 2209  GLUCAP 104*    Microbiology Studies:  No results found for this or any previous visit (from the past 240 hour(s)).  Radiology Studies:  Overnight EEG with video  Result Date: 10/11/2023 Charlsie Quest, MD     10/11/2023  9:45 AM Patient Name: Jeffrey Mullen MRN: [id number] Epilepsy Attending: Charlsie Quest Referring Physician/Provider: Erick Blinks, MD Duration: 10/10/2023 2352 to 10/11/2023 0915 Patient history: 72yo M with seizure like activity getting eeg to evaluate for seizure. Level of alertness: Awake, asleep AEDs during EEG study: LEV Technical aspects: This EEG study was  done with scalp electrodes positioned according to the 10-20 International system of electrode placement. Electrical activity was reviewed with band pass filter of 1-70Hz , sensitivity of 7 uV/mm, display speed of 75mm/sec with a 60Hz  notched filter applied as appropriate. EEG data were recorded continuously and digitally stored.  Video monitoring was available and reviewed as appropriate. Description: At the beginning of the study, EEG showed seizures without any concerning arising from right temporoparietal region.  During the seizure, EEG showed sharp with in right temporoparietal region followed by 4 to 5 Hz theta slowing which gradually involved all of right hemisphere and evolved into rhythmic 2 to 3 Hz delta slowing.  4 seizures were noted between 10/10/2023 2355 to 2357, each lasting about 20 seconds.  As antiseizure medications were adjusted, seizures resolved.  Subsequently EEG showed posterior dominant rhythm consists of 9-10 Hz activity of moderate voltage (25-35 uV) seen predominantly in posterior head regions, asymmetric ( right<left) and reactive to eye opening and eye closing.  Sleep was characterized by vertex waves, sleep spindles (12 to 14 Hz), maximal frontocentral  region.  Additionally there was needed genius 3 to 5 Hz theta-delta slowing in right temporoparietal region.  Hyperventilation and photic stimulation were not performed.   ABNORMALITY -Seizure without clinical signs, right temporoparietal region -Continuous slow, right temporoparietal region IMPRESSION: At the beginning of the study, EEG showed 4 seizures without clinical signs between 10/10/2023 2355 to 2357 arising from right temporoparietal region, lasting about 20 seconds each.  As antiseizure medications were adjusted, seizures resolved.  Subsequently EEG was suggestive of cortical dysfunction arising from right temporoparietal region likely secondary to underlying structural abnormality, postictal state. Priyanka Annabelle Harman   CT  ANGIO HEAD NECK W WO CM W PERF (CODE STROKE)  Result Date: 10/10/2023 CLINICAL DATA:  Left-sided weakness, history of seizures EXAM: CT ANGIOGRAPHY HEAD AND NECK CT PERFUSION BRAIN TECHNIQUE: Multidetector CT imaging of the head and neck was performed using the standard protocol during bolus administration of intravenous contrast. Multiplanar CT image reconstructions and MIPs were obtained to evaluate the vascular anatomy. Carotid stenosis measurements (when applicable) are obtained utilizing NASCET criteria, using the distal internal carotid diameter as the denominator. Multiphase CT imaging of the brain was performed following IV bolus contrast injection. Subsequent parametric perfusion maps were calculated using RAPID software. RADIATION DOSE REDUCTION: This exam was performed according to the departmental dose-optimization program which includes automated exposure control, adjustment of the mA and/or kV according to patient size and/or use of iterative reconstruction technique. CONTRAST:  75mL OMNIPAQUE IOHEXOL 350 MG/ML SOLN; 40mL OMNIPAQUE IOHEXOL 350 MG/ML SOLN COMPARISON:  10/23/2021 CTA head and neck, correlation is also made with 10/10/2023 CT head FINDINGS: CT HEAD FINDINGS For noncontrast findings, please see same day CT head. CTA NECK FINDINGS Aortic arch: Two-vessel arch with a common origin of the brachiocephalic and left common carotid arteries. Imaged portion shows no evidence of aneurysm or dissection. No significant stenosis of the major arch vessel origins. Aortic atherosclerosis. Right carotid system: No evidence of dissection, occlusion, or hemodynamically significant stenosis (greater than 50%). Atherosclerotic disease at the bifurcation and in the proximal ICA is not hemodynamically significant. Left carotid system: 70% stenosis in the proximal left ICA (series 7, image 227). No evidence of dissection. Vertebral arteries: Left dominant system. Severe stenosis in the proximal right V1  (series 7, image 299), which is new from the prior exam. The vertebral arteries are otherwise patent to the skull base without significant stenosis. Skeleton: No acute osseous abnormality. Degenerative changes in the cervical spine. Other neck: No acute finding. Upper chest: No focal pulmonary opacity or pleural effusion. Review of the MIP images confirms the above findings CTA HEAD FINDINGS Anterior circulation: Both internal carotid arteries are patent to the termini, with diffuse dense calcifications, that cause severe stenosis in the distal cavernous ICA bilaterally. A1 segments patent. Normal anterior communicating artery. Anterior cerebral arteries are patent to their distal aspects without significant stenosis. Multifocal mild stenosis in the MCAs bilaterally (series 7 images 87, 96, and 109, for example). No high-grade stenosis. Posterior circulation: Vertebral arteries patent to the vertebrobasilar junction with severe stenosis in the right V4 (series 7, image 152 and 196), similar to prior. Posterior inferior cerebellar arteries patent proximally. Basilar patent to its distal aspect with moderate to severe stenosis distally, which is new from the prior exam (series 7, image 111 and series 8, image 145). Superior cerebellar arteries patent proximally. Patent P1 segments, hypoplastic on the left. Near fetal origin of the left PCA from the left posterior communicating artery. Mild-to-moderate stenosis in the proximal right  P2 (series 7, image 112) and moderate stenosis in a left P3 branch (series 7, image 107), similar prior. Venous sinuses: As permitted by contrast timing, patent. Anatomic variants: Near fetal origin of the left PCA. No evidence of aneurysm or vascular malformation. Review of the MIP images confirms the above findings CT Brain Perfusion Findings: ASPECTS: 10 CBF (<30%) Volume: 0mL Perfusion (Tmax>6.0s) volume: 6mL Mismatch Volume: 6mL Infarction Location:No infarct core, area of possible  ischemic penumbra in the right PCA territory in the medial right parietal lobe. IMPRESSION: 1. No intracranial large vessel occlusion. 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 2. Severe stenosis in the proximal right V1, which is new from the prior exam. Severe stenosis in the right V4, which is similar to the prior exam. 3. 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 4. Mild-to-moderate stenosis in the proximal right P2 and moderate stenosis in a left P3 branch, similar prior. 5. 70% stenosis in the proximal left ICA. 6. No infarct core. Area of possible ischemic penumbra in the right PCA territory in the medial right parietal lobe. 7. Aortic atherosclerosis. Aortic Atherosclerosis (ICD10-I70.0). Imaging results were communicated on 10/10/2023 at 10:07 pm to provider Dr. Derry Lory via secure text paging. Electronically Signed   By: Wiliam Ke M.D.   On: 10/10/2023 22:07   CT CEREBRAL PERFUSION W CONTRAST  Result Date: 10/10/2023 CLINICAL DATA:  Left-sided weakness, history of seizures EXAM: CT ANGIOGRAPHY HEAD AND NECK CT PERFUSION BRAIN TECHNIQUE: Multidetector CT imaging of the head and neck was performed using the standard protocol during bolus administration of intravenous contrast. Multiplanar CT image reconstructions and MIPs were obtained to evaluate the vascular anatomy. Carotid stenosis measurements (when applicable) are obtained utilizing NASCET criteria, using the distal internal carotid diameter as the denominator. Multiphase CT imaging of the brain was performed following IV bolus contrast injection. Subsequent parametric perfusion maps were calculated using RAPID software. RADIATION DOSE REDUCTION: This exam was performed according to the departmental dose-optimization program which includes automated exposure control, adjustment of the mA and/or kV according to patient size and/or use of iterative reconstruction technique.  CONTRAST:  75mL OMNIPAQUE IOHEXOL 350 MG/ML SOLN; 40mL OMNIPAQUE IOHEXOL 350 MG/ML SOLN COMPARISON:  10/23/2021 CTA head and neck, correlation is also made with 10/10/2023 CT head FINDINGS: CT HEAD FINDINGS For noncontrast findings, please see same day CT head. CTA NECK FINDINGS Aortic arch: Two-vessel arch with a common origin of the brachiocephalic and left common carotid arteries. Imaged portion shows no evidence of aneurysm or dissection. No significant stenosis of the major arch vessel origins. Aortic atherosclerosis. Right carotid system: No evidence of dissection, occlusion, or hemodynamically significant stenosis (greater than 50%). Atherosclerotic disease at the bifurcation and in the proximal ICA is not hemodynamically significant. Left carotid system: 70% stenosis in the proximal left ICA (series 7, image 227). No evidence of dissection. Vertebral arteries: Left dominant system. Severe stenosis in the proximal right V1 (series 7, image 299), which is new from the prior exam. The vertebral arteries are otherwise patent to the skull base without significant stenosis. Skeleton: No acute osseous abnormality. Degenerative changes in the cervical spine. Other neck: No acute finding. Upper chest: No focal pulmonary opacity or pleural effusion. Review of the MIP images confirms the above findings CTA HEAD FINDINGS Anterior circulation: Both internal carotid arteries are patent to the termini, with diffuse dense calcifications, that cause severe stenosis in the distal cavernous ICA bilaterally. A1 segments patent.  Normal anterior communicating artery. Anterior cerebral arteries are patent to their distal aspects without significant stenosis. Multifocal mild stenosis in the MCAs bilaterally (series 7 images 87, 96, and 109, for example). No high-grade stenosis. Posterior circulation: Vertebral arteries patent to the vertebrobasilar junction with severe stenosis in the right V4 (series 7, image 152 and 196), similar  to prior. Posterior inferior cerebellar arteries patent proximally. Basilar patent to its distal aspect with moderate to severe stenosis distally, which is new from the prior exam (series 7, image 111 and series 8, image 145). Superior cerebellar arteries patent proximally. Patent P1 segments, hypoplastic on the left. Near fetal origin of the left PCA from the left posterior communicating artery. Mild-to-moderate stenosis in the proximal right P2 (series 7, image 112) and moderate stenosis in a left P3 branch (series 7, image 107), similar prior. Venous sinuses: As permitted by contrast timing, patent. Anatomic variants: Near fetal origin of the left PCA. No evidence of aneurysm or vascular malformation. Review of the MIP images confirms the above findings CT Brain Perfusion Findings: ASPECTS: 10 CBF (<30%) Volume: 0mL Perfusion (Tmax>6.0s) volume: 6mL Mismatch Volume: 6mL Infarction Location:No infarct core, area of possible ischemic penumbra in the right PCA territory in the medial right parietal lobe. IMPRESSION: 1. No intracranial large vessel occlusion. 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 2. Severe stenosis in the proximal right V1, which is new from the prior exam. Severe stenosis in the right V4, which is similar to the prior exam. 3. 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 4. Mild-to-moderate stenosis in the proximal right P2 and moderate stenosis in a left P3 branch, similar prior. 5. 70% stenosis in the proximal left ICA. 6. No infarct core. Area of possible ischemic penumbra in the right PCA territory in the medial right parietal lobe. 7. Aortic atherosclerosis. Aortic Atherosclerosis (ICD10-I70.0). Imaging results were communicated on 10/10/2023 at 10:07 pm to provider Dr. Derry Lory via secure text paging. Electronically Signed   By: Wiliam Ke M.D.   On: 10/10/2023 22:07   CT HEAD CODE STROKE WO CONTRAST  Result Date:  10/10/2023 CLINICAL DATA:  Code stroke.  Left-sided weakness, seizures EXAM: CT HEAD WITHOUT CONTRAST TECHNIQUE: Contiguous axial images were obtained from the base of the skull through the vertex without intravenous contrast. RADIATION DOSE REDUCTION: This exam was performed according to the departmental dose-optimization program which includes automated exposure control, adjustment of the mA and/or kV according to patient size and/or use of iterative reconstruction technique. COMPARISON:  09/09/2023 FINDINGS: Brain: Hemorrhage, mass, mass effect, or midline shift. No hydrocephalus or extra-axial collection. 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 Redemonstrated bilateral basal ganglia lacunar infarcts. Vascular: No hyperdense vessel. Skull: Negative for fracture or focal lesion. 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 Clear paranasal sinuses. No acute finding in the orbits. Other: The mastoid air cells are well aerated. ASPECTS ([place] Stroke Program Early CT Score) - Ganglionic level infarction (caudate, lentiform nuclei, internal capsule, insula, M1-M3 cortex): 7 - Supraganglionic infarction (M4-M6 cortex): 3 Total score (0-10 with 10 being normal): 10 IMPRESSION: No acute intracranial hemorrhage or evidence of acute territorial infarct. ASPECTS is 10. Imaging results were communicated on 10/10/2023 at 9:30 pm to provider Dr. Derry Lory via secure text paging. Electronically Signed   By: Wiliam Ke M.D.   On: 10/10/2023 21:30    Scheduled Meds:    atropine  1 drop Both Eyes  TID   enoxaparin (LOVENOX) injection  40 mg Subcutaneous Q24H   lactulose  30 g Oral TID   levETIRAcetam  500 mg Oral BID   midazolam  2 mg Intravenous Once    Continuous Infusions:     LOS: 1 day     Marcellus Scott, MD,  FACP, [facility], [facility], [facility]   [facility] [place]      To contact the attending provider between  7A-7P or the covering provider during after hours 7P-7A, please log into the web site [email or web address] and access using universal [place] password for that web site. If you do not have the password, please call the hospital operator.  10/11/2023, 1:23 PM

## 2023-10-11 NOTE — Progress Notes (Addendum)
Patient off the unit for MRI 1750 Patient back to the unit

## 2023-10-12 DIAGNOSIS — R569 Unspecified convulsions: Secondary | ICD-10-CM | POA: Diagnosis not present

## 2023-10-12 LAB — BASIC METABOLIC PANEL
Anion gap: 15 (ref 5–15)
BUN: 6 mg/dL — ABNORMAL LOW (ref 8–23)
CO2: 24 mmol/L (ref 22–32)
Calcium: 9.8 mg/dL (ref 8.9–10.3)
Chloride: 102 mmol/L (ref 98–111)
Creatinine, Ser: 1.02 mg/dL (ref 0.61–1.24)
GFR, Estimated: >60 mL/min (ref >60)
Glucose, Bld: 98 mg/dL (ref 70–99)
Potassium: 3.7 mmol/L (ref 3.5–5.1)
Sodium: 141 mmol/L (ref 135–145)

## 2023-10-12 NOTE — Plan of Care (Signed)

## 2023-10-12 NOTE — Plan of Care (Signed)
  Problem: Education: Goal: Knowledge of General Education information will improve Description: Including pain rating scale, medication(s)/side effects and non-pharmacologic comfort measures Outcome: Progressing   Problem: Clinical Measurements: Goal: Respiratory complications will improve Outcome: Progressing Goal: Cardiovascular complication will be avoided Outcome: Progressing   Problem: Activity: Goal: Risk for activity intolerance will decrease Outcome: Progressing   Problem: Nutrition: Goal: Adequate nutrition will be maintained Outcome: Progressing   Problem: Coping: Goal: Level of anxiety will decrease Outcome: Progressing   Problem: Elimination: Goal: Will not experience complications related to bowel motility Outcome: Progressing Goal: Will not experience complications related to urinary retention Outcome: Progressing   Problem: Skin Integrity: Goal: Risk for impaired skin integrity will decrease Outcome: Progressing

## 2023-10-12 NOTE — Plan of Care (Signed)
Was asked to f/u brain imaging  MRI brain with no evidence of acute infarction or structural abnormality.  Rest of the plan as in Dr. Candi Leash note.  Relayed to Dr. Waymon Amato  Please call with questions PRN  -- Milon Dikes, MD Neurologist [facility]

## 2023-10-12 NOTE — Progress Notes (Signed)
PROGRESS NOTE   Jeffrey Mullen  NUU:[id number]    DOB: 1951/11/08    DOA: 10/10/2023  PCP: Claiborne Rigg, NP   I have briefly reviewed patients previous medical records in [facility].  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  In ED had third episode of seizure like activity.  Code stroke was activated.  CT head negative, CTA head and neck without LVO.  Admitted for seizures with postictal left-sided Todd's paresis.  Hooked onto LTM EEG.  Also hyperammonemia.  Improving.   Assessment & Plan:  Principal Problem:   Seizure (HCC)   Epilepsy with breakthrough seizure, focal convulsive status epilepticus In a patient with prior history of seizure disorder, TBI, seizure propensity, alcohol use disorder, hyperammonemia and medication noncompliance. CT head without acute findings CTA head and neck without LVO. Loaded with 2 g of Keppra IV.  LTM EEG without seizures, discontinued. MRI brain without acute stroke. Continue Keppra 500 Mg twice daily.  As needed IV Ativan for seizures Seizure precautions Extensively counseled regarding importance of abstinence from alcohol, compliance with medications and MD follow-up, driving and other restrictions due to seizure disorder. Outpatient follow-up with neurology and 3 months. Neurology follow-up appreciated.  Discussed with Dr. Wilford Corner.  Cleared for DC  from neurology standpoint.  Alcoholic cirrhosis/hyperammonemia and possible acute hepatic encephalopathy Ammonia on admission was 105, down to 49 the next day. Continue lactulose and target for 3-4 BMs per day.  Last BM was 10/18. Alcohol abstinence counseled.  INR 1.1. Appears coherent.  Hypokalemia Replaced.  Magnesium 2.  Polysubstance abuse (alcohol and cocaine) UDS positive for cocaine on 09/09/2023 Blood alcohol level less than 10 CIWA scores low, 0-1. Cessation counseled.  Prior embolic strokes with left eye blindness MRI brain without acute stroke.  Medication noncompliance Counseled compliance.  Body mass index is 22.45 kg/m.   DVT prophylaxis: enoxaparin (LOVENOX) injection 40 mg Start: 10/11/23 1000     Code Status: Limited: Do not attempt resuscitation (DNR) -DNR-LIMITED -Do Not Intubate/DNI :  Family Communication: None at bedside Disposition:  From medical standpoint, stable for DC but only ambulated 10 feet with PT yesterday and they had recommended SNF.  Will await their repeat follow-up to determine disposition.     Consultants:   Neurology  Procedures:     Antimicrobials:      Subjective:  Denies complaints.  "I am okay".  Asking "what did my MRI show?".  Informed him of the results.  Objective:   Vitals:   10/11/23 2324 10/12/23 0349 10/12/23 0852 10/12/23 1243  BP: (!) 143/83 (!) 152/87 (!) 151/72 139/70  Pulse: (!) 59 60 (!) 59 65  Resp: 16 16 18 18   Temp: 98.3 F (36.8 C) (!) 97.5 F (36.4 C) 97.8 F (36.6 C) 97.8 F (36.6 C)  TempSrc: Oral Axillary Oral Oral  SpO2: 98% 98% 97% 96%  Weight:      Height:        General exam: Middle-age male, unkempt, moderately  built and nourished lying comfortably propped up in bed without distress. Respiratory system: Clear to auscultation. Respiratory effort normal. Cardiovascular system: S1 & S2 heard, RRR. No JVD, murmurs, rubs, gallops or clicks. No pedal edema.  Telemetry personally  reviewed: Sinus rhythm.  Discontinued telemetry. Gastrointestinal system: Abdomen is nondistended, soft and nontender. No organomegaly or masses felt. Normal bowel sounds heard. Central nervous system: Alert and oriented. No focal neurological deficits. Extremities: Symmetric 5 x 5 power. Skin: No rashes, lesions or ulcers Psychiatry: Judgement and insight appears intact. Mood & affect appropriate.     Data Reviewed:   I have personally reviewed following labs and imaging studies   CBC: Recent Labs  Lab 10/10/23 2104 10/10/23 2109 10/11/23 0846  WBC 8.2  --  7.3  NEUTROABS 4.3  --   --   HGB 15.3 17.0 13.2  HCT 48.7 50.0 38.9*  MCV 89.2  --  81.0  PLT 260  --  243    Basic Metabolic Panel: Recent Labs  Lab 10/10/23 2104 10/10/23 2109 10/11/23 0724 10/12/23 0529  NA 140 142 140 141  K 4.0 4.1 3.3* 3.7  CL 104 106 105 102  CO2 17*  --  24 24  GLUCOSE 124* 121* 125* 98  BUN 9 10 6* 6*  CREATININE 1.07 0.90 0.92 1.02  CALCIUM 9.6  --  9.4 9.8  MG  --   --  2.0  --   PHOS  --   --  3.3  --     Liver Function Tests: Recent Labs  Lab 10/10/23 2104 10/11/23 0724  AST 41 38  ALT 32 34  ALKPHOS 104 92  BILITOT 0.3 0.6  PROT 8.2* 6.9  ALBUMIN 4.4 3.6    CBG: Recent Labs  Lab 10/10/23 2209  GLUCAP 104*    Microbiology Studies:  No results found for this or any previous visit (from the past 240 hour(s)).  Radiology Studies:  MR BRAIN WO CONTRAST  Result Date: 10/12/2023 CLINICAL DATA:  Stroke, follow-up EXAM: MRI HEAD WITHOUT CONTRAST TECHNIQUE: Multiplanar, multiecho pulse sequences of the brain and surrounding structures were obtained without intravenous contrast. COMPARISON:  11/03/2021 MRI head, correlation is also made with CT head 10/10/2023 FINDINGS: Brain: No restricted diffusion to suggest acute or subacute infarct. No acute hemorrhage, mass, mass effect, or midline shift. No hydrocephalus or extra-axial collection. Normal pituitary and  craniocervical junction. Encephalomalacia in the right occipital lobe and scattered lacunar infarcts in the bilateral basal ganglia, left thalamus, left pons, and periventricular white matter, many of which are the sequela of the acute infarcts noted on 11/12/2021. Vascular: Normal arterial flow voids. Skull and upper cervical spine: Normal marrow signal. Sinuses/Orbits: Clear paranasal sinuses. Left staphyloma. Status post bilateral lens replacements. Sequela of remote left orbital blowout fracture. Other: The mastoid air cells are well aerated. IMPRESSION: No acute intracranial process. No evidence of acute or subacute infarct. Electronically Signed   By: Wiliam Ke M.D.   On: 10/12/2023 01:14   Overnight EEG with video  Result Date: 10/11/2023 Charlsie Quest, MD     10/11/2023  5:21 PM Patient Name: Jeffrey Mullen MRN: [id number] Epilepsy Attending: Charlsie Quest Referring Physician/Provider: Erick Blinks, MD Duration: 10/10/2023 2352 to 10/11/2023 1320 Patient history: 72yo M with seizure like activity getting eeg to evaluate for seizure. Level of alertness: Awake, asleep AEDs during EEG study: LEV Technical aspects: This EEG study was done with scalp electrodes positioned according to the 10-20 International system of  electrode placement. Electrical activity was reviewed with band pass filter of 1-70Hz , sensitivity of 7 uV/mm, display speed of 63mm/sec with a 60Hz  notched filter applied as appropriate. EEG data were recorded continuously and digitally stored.  Video monitoring was available and reviewed as appropriate. Description: At the beginning of the study, EEG showed seizures without any concerning arising from right temporoparietal region.  During the seizure, EEG showed sharp with in right temporoparietal region followed by 4 to 5 Hz theta slowing which gradually involved all of right hemisphere and evolved into rhythmic 2 to 3 Hz delta slowing.  4 seizures were noted between  10/10/2023 2355 to 2357, each lasting about 20 seconds.  As antiseizure medications were adjusted, seizures resolved.  Subsequently EEG showed posterior dominant rhythm consists of 9-10 Hz activity of moderate voltage (25-35 uV) seen predominantly in posterior head regions, asymmetric ( right<left) and reactive to eye opening and eye closing.  Sleep was characterized by vertex waves, sleep spindles (12 to 14 Hz), maximal frontocentral region.  Additionally there was needed genius 3 to 5 Hz theta-delta slowing in right temporoparietal region.  Hyperventilation and photic stimulation were not performed.   ABNORMALITY -Seizure without clinical signs, right temporoparietal region -Continuous slow, right temporoparietal region IMPRESSION: At the beginning of the study, EEG showed 4 seizures without clinical signs between 10/10/2023 2355 to 2357 arising from right temporoparietal region, lasting about 20 seconds each.  As antiseizure medications were adjusted, seizures resolved.  Subsequently EEG was suggestive of cortical dysfunction arising from right temporoparietal region likely secondary to underlying structural abnormality, postictal state. Priyanka Annabelle Harman   CT ANGIO HEAD NECK W WO CM W PERF (CODE STROKE)  Result Date: 10/10/2023 CLINICAL DATA:  Left-sided weakness, history of seizures EXAM: CT ANGIOGRAPHY HEAD AND NECK CT PERFUSION BRAIN TECHNIQUE: Multidetector CT imaging of the head and neck was performed using the standard protocol during bolus administration of intravenous contrast. Multiplanar CT image reconstructions and MIPs were obtained to evaluate the vascular anatomy. Carotid stenosis measurements (when applicable) are obtained utilizing NASCET criteria, using the distal internal carotid diameter as the denominator. Multiphase CT imaging of the brain was performed following IV bolus contrast injection. Subsequent parametric perfusion maps were calculated using RAPID software. RADIATION DOSE  REDUCTION: This exam was performed according to the departmental dose-optimization program which includes automated exposure control, adjustment of the mA and/or kV according to patient size and/or use of iterative reconstruction technique. CONTRAST:  75mL OMNIPAQUE IOHEXOL 350 MG/ML SOLN; 40mL OMNIPAQUE IOHEXOL 350 MG/ML SOLN COMPARISON:  10/23/2021 CTA head and neck, correlation is also made with 10/10/2023 CT head FINDINGS: CT HEAD FINDINGS For noncontrast findings, please see same day CT head. CTA NECK FINDINGS Aortic arch: Two-vessel arch with a common origin of the brachiocephalic and left common carotid arteries. Imaged portion shows no evidence of aneurysm or dissection. No significant stenosis of the major arch vessel origins. Aortic atherosclerosis. Right carotid system: No evidence of dissection, occlusion, or hemodynamically significant stenosis (greater than 50%). Atherosclerotic disease at the bifurcation and in the proximal ICA is not hemodynamically significant. Left carotid system: 70% stenosis in the proximal left ICA (series 7, image 227). No evidence of dissection. Vertebral arteries: Left dominant system. Severe stenosis in the proximal right V1 (series 7, image 299), which is new from the prior exam. The vertebral arteries are otherwise patent to the skull base without significant stenosis. Skeleton: No acute osseous abnormality. Degenerative changes in the cervical spine. Other neck: No acute finding. Upper chest:  No focal pulmonary opacity or pleural effusion. Review of the MIP images confirms the above findings CTA HEAD FINDINGS Anterior circulation: Both internal carotid arteries are patent to the termini, with diffuse dense calcifications, that cause severe stenosis in the distal cavernous ICA bilaterally. A1 segments patent. Normal anterior communicating artery. Anterior cerebral arteries are patent to their distal aspects without significant stenosis. Multifocal mild stenosis in the MCAs  bilaterally (series 7 images 87, 96, and 109, for example). No high-grade stenosis. Posterior circulation: Vertebral arteries patent to the vertebrobasilar junction with severe stenosis in the right V4 (series 7, image 152 and 196), similar to prior. Posterior inferior cerebellar arteries patent proximally. Basilar patent to its distal aspect with moderate to severe stenosis distally, which is new from the prior exam (series 7, image 111 and series 8, image 145). Superior cerebellar arteries patent proximally. Patent P1 segments, hypoplastic on the left. Near fetal origin of the left PCA from the left posterior communicating artery. Mild-to-moderate stenosis in the proximal right P2 (series 7, image 112) and moderate stenosis in a left P3 branch (series 7, image 107), similar prior. Venous sinuses: As permitted by contrast timing, patent. Anatomic variants: Near fetal origin of the left PCA. No evidence of aneurysm or vascular malformation. Review of the MIP images confirms the above findings CT Brain Perfusion Findings: ASPECTS: 10 CBF (<30%) Volume: 0mL Perfusion (Tmax>6.0s) volume: 6mL Mismatch Volume: 6mL Infarction Location:No infarct core, area of possible ischemic penumbra in the right PCA territory in the medial right parietal lobe. IMPRESSION: 1. No intracranial large vessel occlusion. 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 2. Severe stenosis in the proximal right V1, which is new from the prior exam. Severe stenosis in the right V4, which is similar to the prior exam. 3. 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 4. Mild-to-moderate stenosis in the proximal right P2 and moderate stenosis in a left P3 branch, similar prior. 5. 70% stenosis in the proximal left ICA. 6. No infarct core. Area of possible ischemic penumbra in the right PCA territory in the medial right parietal lobe. 7. Aortic atherosclerosis. Aortic Atherosclerosis  (ICD10-I70.0). Imaging results were communicated on 10/10/2023 at 10:07 pm to provider Dr. Derry Lory via secure text paging. Electronically Signed   By: Wiliam Ke M.D.   On: 10/10/2023 22:07   CT CEREBRAL PERFUSION W CONTRAST  Result Date: 10/10/2023 CLINICAL DATA:  Left-sided weakness, history of seizures EXAM: CT ANGIOGRAPHY HEAD AND NECK CT PERFUSION BRAIN TECHNIQUE: Multidetector CT imaging of the head and neck was performed using the standard protocol during bolus administration of intravenous contrast. Multiplanar CT image reconstructions and MIPs were obtained to evaluate the vascular anatomy. Carotid stenosis measurements (when applicable) are obtained utilizing NASCET criteria, using the distal internal carotid diameter as the denominator. Multiphase CT imaging of the brain was performed following IV bolus contrast injection. Subsequent parametric perfusion maps were calculated using RAPID software. RADIATION DOSE REDUCTION: This exam was performed according to the departmental dose-optimization program which includes automated exposure control, adjustment of the mA and/or kV according to patient size and/or use of iterative reconstruction technique. CONTRAST:  75mL OMNIPAQUE IOHEXOL 350 MG/ML SOLN; 40mL OMNIPAQUE IOHEXOL 350 MG/ML SOLN COMPARISON:  10/23/2021 CTA head and neck, correlation is also made with 10/10/2023 CT head FINDINGS: CT HEAD FINDINGS For noncontrast findings, please see same day CT head. CTA NECK FINDINGS Aortic arch: Two-vessel arch with a common origin of the brachiocephalic and  left common carotid arteries. Imaged portion shows no evidence of aneurysm or dissection. No significant stenosis of the major arch vessel origins. Aortic atherosclerosis. Right carotid system: No evidence of dissection, occlusion, or hemodynamically significant stenosis (greater than 50%). Atherosclerotic disease at the bifurcation and in the proximal ICA is not hemodynamically significant. Left  carotid system: 70% stenosis in the proximal left ICA (series 7, image 227). No evidence of dissection. Vertebral arteries: Left dominant system. Severe stenosis in the proximal right V1 (series 7, image 299), which is new from the prior exam. The vertebral arteries are otherwise patent to the skull base without significant stenosis. Skeleton: No acute osseous abnormality. Degenerative changes in the cervical spine. Other neck: No acute finding. Upper chest: No focal pulmonary opacity or pleural effusion. Review of the MIP images confirms the above findings CTA HEAD FINDINGS Anterior circulation: Both internal carotid arteries are patent to the termini, with diffuse dense calcifications, that cause severe stenosis in the distal cavernous ICA bilaterally. A1 segments patent. Normal anterior communicating artery. Anterior cerebral arteries are patent to their distal aspects without significant stenosis. Multifocal mild stenosis in the MCAs bilaterally (series 7 images 87, 96, and 109, for example). No high-grade stenosis. Posterior circulation: Vertebral arteries patent to the vertebrobasilar junction with severe stenosis in the right V4 (series 7, image 152 and 196), similar to prior. Posterior inferior cerebellar arteries patent proximally. Basilar patent to its distal aspect with moderate to severe stenosis distally, which is new from the prior exam (series 7, image 111 and series 8, image 145). Superior cerebellar arteries patent proximally. Patent P1 segments, hypoplastic on the left. Near fetal origin of the left PCA from the left posterior communicating artery. Mild-to-moderate stenosis in the proximal right P2 (series 7, image 112) and moderate stenosis in a left P3 branch (series 7, image 107), similar prior. Venous sinuses: As permitted by contrast timing, patent. Anatomic variants: Near fetal origin of the left PCA. No evidence of aneurysm or vascular malformation. Review of the MIP images confirms the  above findings CT Brain Perfusion Findings: ASPECTS: 10 CBF (<30%) Volume: 0mL Perfusion (Tmax>6.0s) volume: 6mL Mismatch Volume: 6mL Infarction Location:No infarct core, area of possible ischemic penumbra in the right PCA territory in the medial right parietal lobe. IMPRESSION: 1. No intracranial large vessel occlusion. 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 2. Severe stenosis in the proximal right V1, which is new from the prior exam. Severe stenosis in the right V4, which is similar to the prior exam. 3. 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 4. Mild-to-moderate stenosis in the proximal right P2 and moderate stenosis in a left P3 branch, similar prior. 5. 70% stenosis in the proximal left ICA. 6. No infarct core. Area of possible ischemic penumbra in the right PCA territory in the medial right parietal lobe. 7. Aortic atherosclerosis. Aortic Atherosclerosis (ICD10-I70.0). Imaging results were communicated on 10/10/2023 at 10:07 pm to provider Dr. Derry Lory via secure text paging. Electronically Signed   By: Wiliam Ke M.D.   On: 10/10/2023 22:07   CT HEAD CODE STROKE WO CONTRAST  Result Date: 10/10/2023 CLINICAL DATA:  Code stroke.  Left-sided weakness, seizures EXAM: CT HEAD WITHOUT CONTRAST TECHNIQUE: Contiguous axial images were obtained from the base of the skull through the vertex without intravenous contrast. RADIATION DOSE REDUCTION: This exam was performed according to the departmental dose-optimization program which includes automated exposure control, adjustment of the mA and/or kV according  to patient size and/or use of iterative reconstruction technique. COMPARISON:  09/09/2023 FINDINGS: Brain: Hemorrhage, mass, mass effect, or midline shift. No hydrocephalus or extra-axial collection. 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 Redemonstrated bilateral basal ganglia lacunar infarcts. Vascular: No  hyperdense vessel. Skull: Negative for fracture or focal lesion. 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 Clear paranasal sinuses. No acute finding in the orbits. Other: The mastoid air cells are well aerated. ASPECTS ([place] Stroke Program Early CT Score) - Ganglionic level infarction (caudate, lentiform nuclei, internal capsule, insula, M1-M3 cortex): 7 - Supraganglionic infarction (M4-M6 cortex): 3 Total score (0-10 with 10 being normal): 10 IMPRESSION: No acute intracranial hemorrhage or evidence of acute territorial infarct. ASPECTS is 10. Imaging results were communicated on 10/10/2023 at 9:30 pm to provider Dr. Derry Lory via secure text paging. Electronically Signed   By: Wiliam Ke M.D.   On: 10/10/2023 21:30    Scheduled Meds:    atropine  1 drop Both Eyes TID   enoxaparin (LOVENOX) injection  40 mg Subcutaneous Q24H   folic acid  1 mg Oral Daily   lactulose  30 g Oral TID   levETIRAcetam  500 mg Oral BID   midazolam  2 mg Intravenous Once   multivitamin with minerals  1 tablet Oral Daily   thiamine  100 mg Oral Daily   Or   thiamine  100 mg Intravenous Daily    Continuous Infusions:     LOS: 2 days     Marcellus Scott, MD,  FACP, [facility], [facility], [facility]   [facility] [place]      To contact the attending provider between 7A-7P or the covering provider during after hours 7P-7A, please log into the web site [email or web address] and access using universal [place] password for that web site. If you do not have the password, please call the hospital operator.  10/12/2023, 3:21 PM

## 2023-10-13 DIAGNOSIS — R569 Unspecified convulsions: Secondary | ICD-10-CM | POA: Diagnosis not present

## 2023-10-13 MED ORDER — LEVETIRACETAM 500 MG PO TABS: 500.0000 mg | ORAL_TABLET | Freq: Two times a day (BID) | ORAL | 2 refills | Status: AC

## 2023-10-13 MED ORDER — ADULT MULTIVITAMIN W/MINERALS CH: 1.0000 | ORAL_TABLET | Freq: Every day | ORAL | Status: AC

## 2023-10-13 MED ORDER — VITAMIN B-1 100 MG PO TABS: 100.0000 mg | ORAL_TABLET | Freq: Every day | ORAL | 0 refills | Status: AC

## 2023-10-13 MED ORDER — LACTULOSE 10 GM/15ML PO SOLN: 20.0000 g | Freq: Three times a day (TID) | ORAL | 2 refills | Status: AC

## 2023-10-13 MED ORDER — FOLIC ACID 1 MG PO TABS: 1.0000 mg | ORAL_TABLET | Freq: Every day | ORAL | 0 refills | Status: AC

## 2023-10-13 NOTE — Discharge Instructions (Signed)

## 2023-10-13 NOTE — Progress Notes (Signed)
Physical Therapy Treatment Patient Details Name: Jeffrey Mullen MRN: [id number] DOB: March 16, 1951 Today's Date: 10/13/2023   History of Present Illness 72 y.o. male who initially presented to the ED as a code stroke 10/17. Had 3 episodes of seizure like activity. CTA head and neck revealed no LVO. PMHx: polysubstance abuse including alcohol and cocaine, seizure disorder, hepatic cirrhosis, history of embolic strokes, left eye blindness, medication noncompliance, history of traumatic brain injury    PT Comments  Pt is making progress toward his goals today, working with OT on entry. Pt is currently supervision for bed mobility and contact guard for transfer and ambulation with RW. Pt reports that he feels like he is close to his baseline but admits fear that he will have a seizure while he is alone. Pt would like to work on weakness in his R LE but does not want to go to SNF at this time. Pt agreeable to HHPT.  Pt also reports that his Rollator brakes are not working. Informed pt that he has gotten Rollator in last 5 years and insurance will not cover. New Rollator would be $65. Pt politely declines and says he can use the RW he has if he needs it.   Pt likely to discharge home this afternoon.    If plan is discharge home, recommend the following: A lot of help with walking and/or transfers;A little help with bathing/dressing/bathroom;Assistance with cooking/housework;Direct supervision/assist for medications management;Assist for transportation   Can travel by private vehicle     Yes  Equipment Recommendations  Rollator (4 wheels) (needs new Rollator, but does not want to pay $65)       Precautions / Restrictions Precautions Precautions: Fall Restrictions Weight Bearing Restrictions: No     Mobility  Bed Mobility Overal bed mobility: Needs Assistance Bed Mobility: Supine to Sit, Sit to Supine     Supine to sit: Supervision Sit to supine: Supervision   General bed mobility  comments: Supervision for safety    Transfers Overall transfer level: Needs assistance Equipment used: Rolling walker (2 wheels) Transfers: Sit to/from Stand Sit to Stand: Contact guard assist           General transfer comment: CGA for safety, vc for handplacement for power up from bed and use of handrail to power up from low toilet surface.    Ambulation/Gait Ambulation/Gait assistance: Contact guard assist Gait Distance (Feet): 150 Feet Assistive device: Rolling walker (2 wheels) Gait Pattern/deviations: Step-through pattern, Decreased stride length, Leaning posteriorly Gait velocity: decr Gait velocity interpretation: 1.31 - 2.62 ft/sec, indicative of limited community ambulator   General Gait Details: light contact guard assist for safety, vc for way finding, mild instability but no overt LoB      Modified Rankin (Stroke Patients Only) Modified Rankin (Stroke Patients Only) Pre-Morbid Rankin Score: Slight disability Modified Rankin: Moderately severe disability     Balance Overall balance assessment: Needs assistance Sitting-balance support: No upper extremity supported, Feet supported Sitting balance-Leahy Scale: Fair     Standing balance support: Bilateral upper extremity supported Standing balance-Leahy Scale: Poor Standing balance comment: Intermittent Mod assist                            Cognition Arousal: Alert Behavior During Therapy: WFL for tasks assessed/performed Overall Cognitive Status: Within Functional Limits for tasks assessed  General Comments: limited insight        Exercises      General Comments General comments (skin integrity, edema, etc.): VSS on      Pertinent Vitals/Pain Pain Assessment Pain Assessment: No/denies pain     PT Goals (current goals can now be found in the care plan section) Acute Rehab PT Goals Patient Stated Goal: Get well PT Goal Formulation:  With patient Time For Goal Achievement: 10/25/23 Potential to Achieve Goals: Good Progress towards PT goals: Progressing toward goals    Frequency    Min 1X/week       AM-PAC PT "6 Clicks" Mobility   Outcome Measure  Help needed turning from your back to your side while in a flat bed without using bedrails?: None Help needed moving from lying on your back to sitting on the side of a flat bed without using bedrails?: A Little Help needed moving to and from a bed to a chair (including a wheelchair)?: A Little Help needed standing up from a chair using your arms (e.g., wheelchair or bedside chair)?: A Little Help needed to walk in hospital room?: A Lot Help needed climbing 3-5 steps with a railing? : A Lot 6 Click Score: 17    End of Session Equipment Utilized During Treatment: Gait belt Activity Tolerance: Patient tolerated treatment well Patient left: with call bell/phone within reach;in chair;with chair alarm set   PT Visit Diagnosis: Unsteadiness on feet (R26.81);Other abnormalities of gait and mobility (R26.89);Muscle weakness (generalized) (M62.81);Difficulty in walking, not elsewhere classified (R26.2)     Time: 1610-9604 PT Time Calculation (min) (ACUTE ONLY): 16 min  Charges:    $Gait Training: 8-22 mins PT General Charges $$ ACUTE PT VISIT: 1 Visit                     Aime Carreras B. Beverely Risen PT, DPT Acute Rehabilitation Services Please use secure chat or  Call Office [phone]    Elon Alas Fleet 10/13/2023, 10:12 AM

## 2023-10-13 NOTE — Discharge Summary (Addendum)
Physician Discharge Summary  Jeffrey Mullen WGN:[id number] DOB: [birth year]  PCP: Claiborne Rigg, NP  Admitted from: Home Discharged to: Home  Admit date: 10/10/2023 Discharge date: 10/13/2023  Recommendations for Outpatient Follow-up:    Follow-up Information     Claiborne Rigg, NP. Schedule an appointment as soon as possible for a visit in 1 week(s).   Specialty: Nurse Practitioner Why: To be seen with repeat labs (CBC & CMP). Contact information: [address] [place] [address] [phone]                  Home Health: Home Health Orders (From admission, onward)     Start     Ordered   10/13/23 1411  Home Health  At discharge       Question:  To provide the following care/treatments  Answer:  PT   10/13/23 1413             Equipment/Devices:     Durable Medical Equipment  (From admission, onward)           Start     Ordered   10/11/23 1110  For home use only DME 4 wheeled rolling walker with seat  Once       Question:  Patient needs a walker to treat with the following condition  Answer:  Weakness   10/11/23 1109             Discharge Condition: Improved and stable.   Code Status: Limited: Do not attempt resuscitation (DNR) -DNR-LIMITED -Do Not Intubate/DNI  Diet recommendation:  Discharge Diet Orders (From admission, onward)     Start     Ordered   10/13/23 0000  Diet - low sodium heart healthy        10/13/23 1413             Discharge Diagnoses:  Principal Problem:   Seizure 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  In ED had third episode of seizure like activity.  Code stroke was activated.  CT head negative, CTA head and neck without LVO.  Admitted for seizures with postictal left-sided Todd's paresis.  Hooked onto LTM EEG.  Also hyperammonemia.   Improved and medically stable for DC on 10/20 but unfortunately the only pharmacy that he uses is closed today and hence he cannot pick up his prescriptions until tomorrow.  Thereby patient will discharge on 10/21 after getting his morning dose of Keppra. Addendum: TOC coordinated with hospital pharmacy and patient will be provided with 2 days supply of Keppra to bridge him until he is able to pick up his medications from the outside pharmacy.     Assessment & Plan:    Epilepsy with breakthrough seizure, focal convulsive status epilepticus In a patient with prior history of seizure disorder, TBI, seizure propensity, alcohol use disorder, hyperammonemia and medication noncompliance. CT head without acute findings CTA head and neck without LVO. Loaded with 2 g of Keppra IV.  LTM EEG appeared to show initial seizures but then none after Keppra load.  Discontinued by neurology.  MRI brain without acute stroke. Continue Keppra 500 Mg twice daily.  As needed IV Ativan for seizures Seizure precautions including driving restrictions have been counseled to patient by multiple providers. Extensively counseled regarding importance of abstinence from alcohol, compliance with medications and MD follow-up, driving and other restrictions due to seizure disorder. Outpatient follow-up with neurology in 3 months.  Ambulatory referral to neurology sent. Neurology follow-up appreciated.  Discussed with Dr. Wilford Corner.  Cleared for DC from neurology standpoint.   Alcoholic cirrhosis/hyperammonemia and possible acute hepatic encephalopathy Ammonia on  admission was 105, down to 49 the next day. Continue lactulose and target for 3-4 BMs per day.  Last BM was 10/19. Alcohol abstinence counseled.  INR 1.1. Appears coherent.   Hypokalemia Replaced.  Magnesium 2.   Polysubstance abuse (alcohol and cocaine) UDS positive for cocaine on 09/09/2023 Blood alcohol level less than 10 CIWA scores low, 0-1. Cessation counseled.   Prior embolic strokes with left eye blindness MRI brain without acute stroke.   Medication noncompliance Counseled compliance.   Body mass index is 22.45 kg/m.     Consultants:   Neurology   Procedures:     Discharge Instructions  Discharge Instructions     Ambulatory referral to Neurology   Complete by: As directed    An appointment is requested in approximately: 3 months.   Call MD for:   Complete by: As directed    Recurrent seizures.   Call MD for:  difficulty breathing, headache or visual disturbances   Complete by: As directed    Call MD for:  extreme fatigue   Complete by: As directed    Call MD for:  persistant dizziness or light-headedness   Complete by: As directed    Call MD for:  persistant nausea and vomiting   Complete by: As directed    Call MD for:  severe uncontrolled pain   Complete by: As directed    Call MD for:  temperature >100.4   Complete by: As directed    Diet - low sodium heart healthy   Complete by: As directed    Driving Restrictions   Complete by: As directed    Per [facility] statutes, patients with seizures are not allowed to drive until they have been seizure-free for six months.  Use caution when using heavy equipment or power tools. Avoid working on ladders or at heights. Take showers instead of baths. Ensure the water temperature is not too high on the home water heater. Do not go swimming alone. Do not lock yourself in a room alone (i.e. bathroom). When caring for infants or small children, sit down when holding, feeding, or changing them to minimize  risk of injury to the child in the event you have a seizure. To reduce risk of seizures, maintain good sleep hygiene avoid alcohol and illicit drug use, take all anti-seizure medications as prescribed.   If patient has another seizure, call 911 and bring them back to the ED if: A.  The seizure lasts longer than 5 minutes.      B.  The patient doesn't wake shortly after the seizure or has new problems such as difficulty seeing, speaking or moving following the seizure C.  The patient was injured during the seizure D.  The patient has a temperature over 102 F (39C) E.  The patient vomited during the seizure and now is having trouble breathing   Increase activity slowly   Complete by: As directed  Medication List     STOP taking these medications    levETIRAcetam 100 MG/ML solution Commonly known as: KEPPRA Replaced by: levETIRAcetam 500 MG tablet       TAKE these medications    atropine 1 % ophthalmic solution Place 1 drop into both eyes in the morning and at bedtime.   folic acid 1 MG tablet Commonly known as: FOLVITE Take 1 tablet (1 mg total) by mouth daily. Start taking on: October 14, 2023   lactulose 10 GM/15ML solution Commonly known as: CHRONULAC Take 30 mLs (20 g total) by mouth 3 (three) times daily. Titrate dose to achieve 3 bowel movements per day.   levETIRAcetam 500 MG tablet Commonly known as: KEPPRA Take 1 tablet (500 mg total) by mouth 2 (two) times daily. Replaces: levETIRAcetam 100 MG/ML solution   multivitamin with minerals Tabs tablet Take 1 tablet by mouth daily. Start taking on: October 14, 2023   thiamine 100 MG tablet Commonly known as: Vitamin B-1 Take 1 tablet (100 mg total) by mouth daily. Start taking on: October 14, 2023       Allergies  Allergen Reactions   Tylenol [Acetaminophen] Other (See Comments)    Causes eye pressure to elevate; CANNOT HAVE THIS      Procedures/Studies: MR BRAIN WO CONTRAST  Result Date:  10/12/2023 CLINICAL DATA:  Stroke, follow-up EXAM: MRI HEAD WITHOUT CONTRAST TECHNIQUE: Multiplanar, multiecho pulse sequences of the brain and surrounding structures were obtained without intravenous contrast. COMPARISON:  11/03/2021 MRI head, correlation is also made with CT head 10/10/2023 FINDINGS: Brain: No restricted diffusion to suggest acute or subacute infarct. No acute hemorrhage, mass, mass effect, or midline shift. No hydrocephalus or extra-axial collection. Normal pituitary and craniocervical junction. Encephalomalacia in the right occipital lobe and scattered lacunar infarcts in the bilateral basal ganglia, left thalamus, left pons, and periventricular white matter, many of which are the sequela of the acute infarcts noted on 11/12/2021. Vascular: Normal arterial flow voids. Skull and upper cervical spine: Normal marrow signal. Sinuses/Orbits: Clear paranasal sinuses. Left staphyloma. Status post bilateral lens replacements. Sequela of remote left orbital blowout fracture. Other: The mastoid air cells are well aerated. IMPRESSION: No acute intracranial process. No evidence of acute or subacute infarct. Electronically Signed   By: Wiliam Ke M.D.   On: 10/12/2023 01:14   Overnight EEG with video  Result Date: 10/11/2023 Charlsie Quest, MD     10/11/2023  5:21 PM Patient Name: Jeffrey Mullen MRN: [id number] Epilepsy Attending: Charlsie Quest Referring Physician/Provider: Erick Blinks, MD Duration: 10/10/2023 2352 to 10/11/2023 1320 Patient history: 72yo M with seizure like activity getting eeg to evaluate for seizure. Level of alertness: Awake, asleep AEDs during EEG study: LEV Technical aspects: This EEG study was done with scalp electrodes positioned according to the 10-20 International system of electrode placement. Electrical activity was reviewed with band pass filter of 1-70Hz , sensitivity of 7 uV/mm, display speed of 25mm/sec with a 60Hz  notched filter applied as appropriate.  EEG data were recorded continuously and digitally stored.  Video monitoring was available and reviewed as appropriate. Description: At the beginning of the study, EEG showed seizures without any concerning arising from right temporoparietal region.  During the seizure, EEG showed sharp with in right temporoparietal region followed by 4 to 5 Hz theta slowing which gradually involved all of right hemisphere and evolved into rhythmic 2 to 3 Hz delta slowing.  4 seizures were noted between 10/10/2023 2355 to 2357, each lasting about 20  seconds.  As antiseizure medications were adjusted, seizures resolved.  Subsequently EEG showed posterior dominant rhythm consists of 9-10 Hz activity of moderate voltage (25-35 uV) seen predominantly in posterior head regions, asymmetric ( right<left) and reactive to eye opening and eye closing.  Sleep was characterized by vertex waves, sleep spindles (12 to 14 Hz), maximal frontocentral region.  Additionally there was needed genius 3 to 5 Hz theta-delta slowing in right temporoparietal region.  Hyperventilation and photic stimulation were not performed.   ABNORMALITY -Seizure without clinical signs, right temporoparietal region -Continuous slow, right temporoparietal region IMPRESSION: At the beginning of the study, EEG showed 4 seizures without clinical signs between 10/10/2023 2355 to 2357 arising from right temporoparietal region, lasting about 20 seconds each.  As antiseizure medications were adjusted, seizures resolved.  Subsequently EEG was suggestive of cortical dysfunction arising from right temporoparietal region likely secondary to underlying structural abnormality, postictal state. Priyanka Annabelle Harman   CT ANGIO HEAD NECK W WO CM W PERF (CODE STROKE)  Result Date: 10/10/2023 CLINICAL DATA:  Left-sided weakness, history of seizures EXAM: CT ANGIOGRAPHY HEAD AND NECK CT PERFUSION BRAIN TECHNIQUE: Multidetector CT imaging of the head and neck was performed using the standard  protocol during bolus administration of intravenous contrast. Multiplanar CT image reconstructions and MIPs were obtained to evaluate the vascular anatomy. Carotid stenosis measurements (when applicable) are obtained utilizing NASCET criteria, using the distal internal carotid diameter as the denominator. Multiphase CT imaging of the brain was performed following IV bolus contrast injection. Subsequent parametric perfusion maps were calculated using RAPID software. RADIATION DOSE REDUCTION: This exam was performed according to the departmental dose-optimization program which includes automated exposure control, adjustment of the mA and/or kV according to patient size and/or use of iterative reconstruction technique. CONTRAST:  75mL OMNIPAQUE IOHEXOL 350 MG/ML SOLN; 40mL OMNIPAQUE IOHEXOL 350 MG/ML SOLN COMPARISON:  10/23/2021 CTA head and neck, correlation is also made with 10/10/2023 CT head FINDINGS: CT HEAD FINDINGS For noncontrast findings, please see same day CT head. CTA NECK FINDINGS Aortic arch: Two-vessel arch with a common origin of the brachiocephalic and left common carotid arteries. Imaged portion shows no evidence of aneurysm or dissection. No significant stenosis of the major arch vessel origins. Aortic atherosclerosis. Right carotid system: No evidence of dissection, occlusion, or hemodynamically significant stenosis (greater than 50%). Atherosclerotic disease at the bifurcation and in the proximal ICA is not hemodynamically significant. Left carotid system: 70% stenosis in the proximal left ICA (series 7, image 227). No evidence of dissection. Vertebral arteries: Left dominant system. Severe stenosis in the proximal right V1 (series 7, image 299), which is new from the prior exam. The vertebral arteries are otherwise patent to the skull base without significant stenosis. Skeleton: No acute osseous abnormality. Degenerative changes in the cervical spine. Other neck: No acute finding. Upper chest: No  focal pulmonary opacity or pleural effusion. Review of the MIP images confirms the above findings CTA HEAD FINDINGS Anterior circulation: Both internal carotid arteries are patent to the termini, with diffuse dense calcifications, that cause severe stenosis in the distal cavernous ICA bilaterally. A1 segments patent. Normal anterior communicating artery. Anterior cerebral arteries are patent to their distal aspects without significant stenosis. Multifocal mild stenosis in the MCAs bilaterally (series 7 images 87, 96, and 109, for example). No high-grade stenosis. Posterior circulation: Vertebral arteries patent to the vertebrobasilar junction with severe stenosis in the right V4 (series 7, image 152 and 196), similar to prior. Posterior inferior cerebellar arteries patent proximally. Basilar  patent to its distal aspect with moderate to severe stenosis distally, which is new from the prior exam (series 7, image 111 and series 8, image 145). Superior cerebellar arteries patent proximally. Patent P1 segments, hypoplastic on the left. Near fetal origin of the left PCA from the left posterior communicating artery. Mild-to-moderate stenosis in the proximal right P2 (series 7, image 112) and moderate stenosis in a left P3 branch (series 7, image 107), similar prior. Venous sinuses: As permitted by contrast timing, patent. Anatomic variants: Near fetal origin of the left PCA. No evidence of aneurysm or vascular malformation. Review of the MIP images confirms the above findings CT Brain Perfusion Findings: ASPECTS: 10 CBF (<30%) Volume: 0mL Perfusion (Tmax>6.0s) volume: 6mL Mismatch Volume: 6mL Infarction Location:No infarct core, area of possible ischemic penumbra in the right PCA territory in the medial right parietal lobe. IMPRESSION: 1. No intracranial large vessel occlusion. 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 2. Severe stenosis in the proximal right V1, which is new from the prior exam. Severe  stenosis in the right V4, which is similar to the prior exam. 3. 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 4. Mild-to-moderate stenosis in the proximal right P2 and moderate stenosis in a left P3 branch, similar prior. 5. 70% stenosis in the proximal left ICA. 6. No infarct core. Area of possible ischemic penumbra in the right PCA territory in the medial right parietal lobe. 7. Aortic atherosclerosis. Aortic Atherosclerosis (ICD10-I70.0). Imaging results were communicated on 10/10/2023 at 10:07 pm to provider Dr. Derry Lory via secure text paging. Electronically Signed   By: Wiliam Ke M.D.   On: 10/10/2023 22:07   CT CEREBRAL PERFUSION W CONTRAST  Result Date: 10/10/2023 CLINICAL DATA:  Left-sided weakness, history of seizures EXAM: CT ANGIOGRAPHY HEAD AND NECK CT PERFUSION BRAIN TECHNIQUE: Multidetector CT imaging of the head and neck was performed using the standard protocol during bolus administration of intravenous contrast. Multiplanar CT image reconstructions and MIPs were obtained to evaluate the vascular anatomy. Carotid stenosis measurements (when applicable) are obtained utilizing NASCET criteria, using the distal internal carotid diameter as the denominator. Multiphase CT imaging of the brain was performed following IV bolus contrast injection. Subsequent parametric perfusion maps were calculated using RAPID software. RADIATION DOSE REDUCTION: This exam was performed according to the departmental dose-optimization program which includes automated exposure control, adjustment of the mA and/or kV according to patient size and/or use of iterative reconstruction technique. CONTRAST:  75mL OMNIPAQUE IOHEXOL 350 MG/ML SOLN; 40mL OMNIPAQUE IOHEXOL 350 MG/ML SOLN COMPARISON:  10/23/2021 CTA head and neck, correlation is also made with 10/10/2023 CT head FINDINGS: CT HEAD FINDINGS For noncontrast findings, please see same day CT head. CTA NECK FINDINGS  Aortic arch: Two-vessel arch with a common origin of the brachiocephalic and left common carotid arteries. Imaged portion shows no evidence of aneurysm or dissection. No significant stenosis of the major arch vessel origins. Aortic atherosclerosis. Right carotid system: No evidence of dissection, occlusion, or hemodynamically significant stenosis (greater than 50%). Atherosclerotic disease at the bifurcation and in the proximal ICA is not hemodynamically significant. Left carotid system: 70% stenosis in the proximal left ICA (series 7, image 227). No evidence of dissection. Vertebral arteries: Left dominant system. Severe stenosis in the proximal right V1 (series 7, image 299), which is new from the prior exam. The vertebral arteries are otherwise patent to the skull base without significant stenosis. Skeleton: No acute osseous abnormality. Degenerative changes  in the cervical spine. Other neck: No acute finding. Upper chest: No focal pulmonary opacity or pleural effusion. Review of the MIP images confirms the above findings CTA HEAD FINDINGS Anterior circulation: Both internal carotid arteries are patent to the termini, with diffuse dense calcifications, that cause severe stenosis in the distal cavernous ICA bilaterally. A1 segments patent. Normal anterior communicating artery. Anterior cerebral arteries are patent to their distal aspects without significant stenosis. Multifocal mild stenosis in the MCAs bilaterally (series 7 images 87, 96, and 109, for example). No high-grade stenosis. Posterior circulation: Vertebral arteries patent to the vertebrobasilar junction with severe stenosis in the right V4 (series 7, image 152 and 196), similar to prior. Posterior inferior cerebellar arteries patent proximally. Basilar patent to its distal aspect with moderate to severe stenosis distally, which is new from the prior exam (series 7, image 111 and series 8, image 145). Superior cerebellar arteries patent proximally.  Patent P1 segments, hypoplastic on the left. Near fetal origin of the left PCA from the left posterior communicating artery. Mild-to-moderate stenosis in the proximal right P2 (series 7, image 112) and moderate stenosis in a left P3 branch (series 7, image 107), similar prior. Venous sinuses: As permitted by contrast timing, patent. Anatomic variants: Near fetal origin of the left PCA. No evidence of aneurysm or vascular malformation. Review of the MIP images confirms the above findings CT Brain Perfusion Findings: ASPECTS: 10 CBF (<30%) Volume: 0mL Perfusion (Tmax>6.0s) volume: 6mL Mismatch Volume: 6mL Infarction Location:No infarct core, area of possible ischemic penumbra in the right PCA territory in the medial right parietal lobe. IMPRESSION: 1. No intracranial large vessel occlusion. 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 2. Severe stenosis in the proximal right V1, which is new from the prior exam. Severe stenosis in the right V4, which is similar to the prior exam. 3. 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 4. Mild-to-moderate stenosis in the proximal right P2 and moderate stenosis in a left P3 branch, similar prior. 5. 70% stenosis in the proximal left ICA. 6. No infarct core. Area of possible ischemic penumbra in the right PCA territory in the medial right parietal lobe. 7. Aortic atherosclerosis. Aortic Atherosclerosis (ICD10-I70.0). Imaging results were communicated on 10/10/2023 at 10:07 pm to provider Dr. Derry Lory via secure text paging. Electronically Signed   By: Wiliam Ke M.D.   On: 10/10/2023 22:07   CT HEAD CODE STROKE WO CONTRAST  Result Date: 10/10/2023 CLINICAL DATA:  Code stroke.  Left-sided weakness, seizures EXAM: CT HEAD WITHOUT CONTRAST TECHNIQUE: Contiguous axial images were obtained from the base of the skull through the vertex without intravenous contrast. RADIATION DOSE REDUCTION: This exam was performed  according to the departmental dose-optimization program which includes automated exposure control, adjustment of the mA and/or kV according to patient size and/or use of iterative reconstruction technique. COMPARISON:  09/09/2023 FINDINGS: Brain: Hemorrhage, mass, mass effect, or midline shift. No hydrocephalus or extra-axial collection. 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 Redemonstrated bilateral basal ganglia lacunar infarcts. Vascular: No hyperdense vessel. Skull: Negative for fracture or focal lesion. 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 Clear paranasal sinuses. No acute finding in the orbits. Other: The mastoid air cells are well aerated. ASPECTS [facility] Stroke Program Early CT Score) - Ganglionic level infarction (caudate, lentiform nuclei, internal capsule, insula, M1-M3 cortex): 7 - Supraganglionic infarction (M4-M6 cortex): 3 Total score (0-10 with 10 being normal): 10 IMPRESSION:  No acute intracranial hemorrhage or evidence of acute territorial infarct. ASPECTS is 10. Imaging results were communicated on 10/10/2023 at 9:30 pm to provider Dr. Derry Lory via secure text paging. Electronically Signed   By: Wiliam Ke M.D.   On: 10/10/2023 21:30      Subjective: Denies complaints.  Ready to go home.  Aware that his pharmacy is closed and hence he cannot pick up his seizure medications today.  Says that he has no other pharmacy that he uses.  Plans to return home with his friend.  Not interested in SNF even if he was offered.  Discharge Exam:  Vitals:   10/13/23 0034 10/13/23 0330 10/13/23 0830 10/13/23 1153  BP: (!) 150/83 (!) 147/74 (!) 148/74 132/76  Pulse: 66 62 62 71  Resp: 16 16 17 18   Temp: 98.6 F (37 C) 97.8 F (36.6 C) 98 F (36.7 C) 97.9 F (36.6 C)  TempSrc: Oral Oral Oral Oral  SpO2: 98% 98% 98% 97%  Weight:      Height:        General exam: Middle-age male, unkempt, moderately built and nourished seen  ambulating steadily using a rolling walker with PT assistance and supervision. Respiratory system: Clear to auscultation. Respiratory effort normal. Cardiovascular system: S1 & S2 heard, RRR. No JVD, murmurs, rubs, gallops or clicks. No pedal edema.  Telemetry personally reviewed: Sinus rhythm.  Discontinued telemetry. Gastrointestinal system: Abdomen is nondistended, soft and nontender. No organomegaly or masses felt. Normal bowel sounds heard. Central nervous system: Alert and oriented. No focal neurological deficits. Extremities: Symmetric 5 x 5 power. Skin: No rashes, lesions or ulcers Psychiatry: Judgement and insight appears intact. Mood & affect appropriate.     The results of significant diagnostics from this hospitalization (including imaging, microbiology, ancillary and laboratory) are listed below for reference.     Microbiology: No results found for this or any previous visit (from the past 240 hour(s)).   Labs: CBC: Recent Labs  Lab 10/10/23 2104 10/10/23 2109 10/11/23 0846  WBC 8.2  --  7.3  NEUTROABS 4.3  --   --   HGB 15.3 17.0 13.2  HCT 48.7 50.0 38.9*  MCV 89.2  --  81.0  PLT 260  --  243    Basic Metabolic Panel: Recent Labs  Lab 10/10/23 2104 10/10/23 2109 10/11/23 0724 10/12/23 0529  NA 140 142 140 141  K 4.0 4.1 3.3* 3.7  CL 104 106 105 102  CO2 17*  --  24 24  GLUCOSE 124* 121* 125* 98  BUN 9 10 6* 6*  CREATININE 1.07 0.90 0.92 1.02  CALCIUM 9.6  --  9.4 9.8  MG  --   --  2.0  --   PHOS  --   --  3.3  --     Liver Function Tests: Recent Labs  Lab 10/10/23 2104 10/11/23 0724  AST 41 38  ALT 32 34  ALKPHOS 104 92  BILITOT 0.3 0.6  PROT 8.2* 6.9  ALBUMIN 4.4 3.6    CBG: Recent Labs  Lab 10/10/23 2209  GLUCAP 104*     Time coordinating discharge: 35 minutes  SIGNED:  Marcellus Scott, MD,  FACP, [facility], [facility], [facility]   [facility] Advisor [place]     To contact the attending provider between  7A-7P or the covering provider during after hours 7P-7A, please log into the web site [email or web address] and access using universal [place] password for that web site. If you do  not have the password, please call the hospital operator.

## 2023-10-13 NOTE — Progress Notes (Signed)
Occupational Therapy Treatment Patient Details Name: Jeffrey Mullen MRN: [id number] DOB: 23-May-1951 Today's Date: 10/13/2023   History of present illness 72 y.o. male who initially presented to the ED as a code stroke 10/17. Had 3 episodes of seizure like activity. CTA head and neck revealed no LVO. PMHx: polysubstance abuse including alcohol and cocaine, seizure disorder, hepatic cirrhosis, history of embolic strokes, left eye blindness, medication noncompliance, history of traumatic brain injury   OT comments  Pt is at baseline at this time. Able to demonstrate sink level grooming, transfers with RW, post-toilet peri care at supervision/GCA. Pt verbalized that he feels like he is back at baseline. R hand is dominant hand and remains weaker, but functional. OT updated dc plan to home with no follow up. OT will continue to follow acutely.        If plan is discharge home, recommend the following:  A little help with bathing/dressing/bathroom;Assistance with cooking/housework;Direct supervision/assist for medications management;Direct supervision/assist for financial management;Assist for transportation;Help with stairs or ramp for entrance   Equipment Recommendations  None recommended by OT    Recommendations for Other Services      Precautions / Restrictions Precautions Precautions: Fall Restrictions Weight Bearing Restrictions: No       Mobility Bed Mobility Overal bed mobility: Needs Assistance Bed Mobility: Supine to Sit     Supine to sit: Supervision     General bed mobility comments: Supervision for safety    Transfers Overall transfer level: Needs assistance Equipment used: Rolling walker (2 wheels) Transfers: Sit to/from Stand Sit to Stand: Contact guard assist           General transfer comment: CGA for safety, vc for handplacement for power up from bed and use of handrail to power up from low toilet surface.     Balance Overall balance assessment:  Needs assistance Sitting-balance support: No upper extremity supported, Feet supported Sitting balance-Leahy Scale: Fair     Standing balance support: Bilateral upper extremity supported Standing balance-Leahy Scale: Poor Standing balance comment: Intermittent Mod assist                           ADL either performed or assessed with clinical judgement   ADL Overall ADL's : Needs assistance/impaired     Grooming: Wash/dry face;Wash/dry hands;Oral care;Supervision/safety;Standing Grooming Details (indicate cue type and reason): sink level, able to find all objects                 Toilet Transfer: Supervision/safety;Ambulation;Rolling walker (2 wheels);Regular Toilet;Grab bars Toilet Transfer Details (indicate cue type and reason): into bathroom Toileting- Clothing Manipulation and Hygiene: Supervision/safety;Sitting/lateral lean Toileting - Clothing Manipulation Details (indicate cue type and reason): front and rear     Functional mobility during ADLs: Contact guard assist;Rolling walker (2 wheels)      Extremity/Trunk Assessment              Vision   Additional Comments: L eye blind   Perception     Praxis      Cognition Arousal: Alert Behavior During Therapy: WFL for tasks assessed/performed Overall Cognitive Status: Within Functional Limits for tasks assessed                                 General Comments: limited insight        Exercises      Shoulder Instructions  General Comments VSS on RA    Pertinent Vitals/ Pain       Pain Assessment Pain Assessment: No/denies pain  Home Living                                          Prior Functioning/Environment              Frequency  Min 1X/week        Progress Toward Goals  OT Goals(current goals can now be found in the care plan section)  Progress towards OT goals: Progressing toward goals  Acute Rehab OT Goals Patient Stated  Goal: get home, get better OT Goal Formulation: With patient Time For Goal Achievement: 10/26/23 Potential to Achieve Goals: Good  Plan      Co-evaluation                 AM-PAC OT "6 Clicks" Daily Activity     Outcome Measure   Help from another person eating meals?: None Help from another person taking care of personal grooming?: A Little Help from another person toileting, which includes using toliet, bedpan, or urinal?: A Little Help from another person bathing (including washing, rinsing, drying)?: A Little Help from another person to put on and taking off regular upper body clothing?: A Little Help from another person to put on and taking off regular lower body clothing?: A Little 6 Click Score: 19    End of Session Equipment Utilized During Treatment: Gait belt;Rolling walker (2 wheels)  OT Visit Diagnosis: Unsteadiness on feet (R26.81);Other abnormalities of gait and mobility (R26.89);Muscle weakness (generalized) (M62.81);History of falling (Z91.81);Repeated falls (R29.6)   Activity Tolerance Patient tolerated treatment well   Patient Left in chair;with call bell/phone within reach;with chair alarm set   Nurse Communication Mobility status        Time: 5621-3086 OT Time Calculation (min): 24 min  Charges: OT General Charges $OT Visit: 1 Visit OT Treatments $Self Care/Home Management : 23-37 mins  Nyoka Cowden OTR/L Acute Rehabilitation Services Office: [phone]  Evern Bio [facility] 10/13/2023, 10:20 AM

## 2023-10-13 NOTE — TOC Transition Note (Signed)
Transition of Care [facility]) - CM/SW Discharge Note   Patient Details  Name: Jeffrey Mullen MRN: [id number] Date of Birth: 08-Aug-1951  [facility]) CM/SW Contact:  Lawerance Sabal, RN Phone Number: 10/13/2023, 2:58 PM   Clinical Narrative:     Unable to set up [facility] services due to payor source, a referral has been made for outpatient therapies.  Cab voucher tubed to unit.  Could not reach patient re rollator  Final next level of care: Home/Self Care Barriers to Discharge: No Barriers Identified   Patient Goals and CMS Choice   Choice offered to / list presented to : Patient  Discharge Placement                         Discharge Plan and Services Additional resources added to the After Visit Summary for     Discharge Planning Services: CM Consult            DME Arranged: Walker rolling with seat DME Agency: [employer] Date DME Agency Contacted: 10/11/23   Representative spoke with at DME Agency: Timothy Lasso            Social Determinants of Health (SDOH) Interventions SDOH Screenings   Food Insecurity: No Food Insecurity (09/06/2022)  Housing: High Risk (12/12/2018)  Transportation Needs: No Transportation Needs (10/10/2022)  Depression (PHQ2-9): Low Risk  (04/13/2022)  Recent Concern: Depression (PHQ2-9) - Medium Risk (04/04/2022)  Tobacco Use: Medium Risk (10/10/2023)     Readmission Risk Interventions     No data to display

## 2023-10-14 ENCOUNTER — Telehealth: Payer: Self-pay

## 2023-10-14 LAB — LEVETIRACETAM LEVEL: Levetiracetam Lvl: 34.7 ug/mL (ref 10.0–40.0)

## 2023-10-14 NOTE — Transitions of Care (Post Inpatient/ED Visit) (Signed)
   10/14/2023  Name: JEREMMY BLAESING MRN: [id number] DOB: 1951-06-06  Today's TOC FU Call Status: Today's TOC FU Call Status:: Unsuccessful Call (1st Attempt) Unsuccessful Call (1st Attempt) Date: 10/14/23  Attempted to reach the patient regarding the most recent Inpatient/ED visit.  Follow Up Plan: Additional outreach attempts will be made to reach the patient to complete the Transitions of Care (Post Inpatient/ED visit) call.   Alyse Low, RN, BA, [facility], CRRN [facility] Coordinator, Transition of Care Ph # [phone]

## 2023-10-15 ENCOUNTER — Telehealth: Payer: Self-pay

## 2023-10-15 NOTE — Transitions of Care (Post Inpatient/ED Visit) (Signed)
   10/15/2023  Name: STANDFORD SNOWBALL MRN: [id number] DOB: August 02, 1951  Today's TOC FU Call Status: Today's TOC FU Call Status:: Unsuccessful Call (2nd Attempt) Unsuccessful Call (2nd Attempt) Date: 10/15/23  Attempted to reach the patient regarding the most recent Inpatient/ED visit.  Follow Up Plan: Additional outreach attempts will be made to reach the patient to complete the Transitions of Care (Post Inpatient/ED visit) call.   Alyse Low, RN, BA, [facility], CRRN [facility] Coordinator, Transition of Care Ph # [phone]

## 2023-10-16 ENCOUNTER — Telehealth: Payer: Self-pay

## 2023-10-16 NOTE — Transitions of Care (Post Inpatient/ED Visit) (Signed)
   10/16/2023  Name: Jeffrey Mullen MRN: [id number] DOB: 04/10/1951  Today's TOC FU Call Status: Today's TOC FU Call Status:: Unsuccessful Call (3rd Attempt) Unsuccessful Call (3rd Attempt) Date: 10/16/23  Attempted to reach the patient regarding the most recent Inpatient/ED visit.  Follow Up Plan: No further outreach attempts will be made at this time. We have been unable to contact the patient.  Alyse Low, RN, BA, [facility], CRRN [facility] Coordinator, Transition of Care Ph # [phone]

## 2024-01-20 ENCOUNTER — Encounter (HOSPITAL_COMMUNITY): Payer: Self-pay | Admitting: Emergency Medicine

## 2024-01-20 ENCOUNTER — Emergency Department (HOSPITAL_COMMUNITY)
Admission: EM | Admit: 2024-01-20 | Discharge: 2024-01-21 | Disposition: A | Discharge status: Home / Self Care | Payer: 59 | Attending: Emergency Medicine | Admitting: Emergency Medicine

## 2024-01-20 ENCOUNTER — Other Ambulatory Visit: Payer: Self-pay

## 2024-01-20 ENCOUNTER — Emergency Department (HOSPITAL_COMMUNITY): Payer: 59

## 2024-01-20 DIAGNOSIS — Z79899 Other long term (current) drug therapy: Secondary | ICD-10-CM | POA: Insufficient documentation

## 2024-01-20 DIAGNOSIS — G40909 Epilepsy, unspecified, not intractable, without status epilepticus: Secondary | ICD-10-CM | POA: Insufficient documentation

## 2024-01-20 DIAGNOSIS — R531 Weakness: Secondary | ICD-10-CM | POA: Diagnosis not present

## 2024-01-20 DIAGNOSIS — R6889 Other general symptoms and signs: Secondary | ICD-10-CM | POA: Diagnosis not present

## 2024-01-20 DIAGNOSIS — I10 Essential (primary) hypertension: Secondary | ICD-10-CM | POA: Insufficient documentation

## 2024-01-20 DIAGNOSIS — I252 Old myocardial infarction: Secondary | ICD-10-CM | POA: Insufficient documentation

## 2024-01-20 DIAGNOSIS — R569 Unspecified convulsions: Secondary | ICD-10-CM | POA: Diagnosis not present

## 2024-01-20 DIAGNOSIS — R9431 Abnormal electrocardiogram [ECG] [EKG]: Secondary | ICD-10-CM | POA: Insufficient documentation

## 2024-01-20 DIAGNOSIS — Z743 Need for continuous supervision: Secondary | ICD-10-CM | POA: Diagnosis not present

## 2024-01-20 DIAGNOSIS — Z7401 Bed confinement status: Secondary | ICD-10-CM | POA: Diagnosis not present

## 2024-01-20 DIAGNOSIS — K7460 Unspecified cirrhosis of liver: Secondary | ICD-10-CM | POA: Diagnosis not present

## 2024-01-20 LAB — COMPREHENSIVE METABOLIC PANEL
ALT: 50 U/L — ABNORMAL HIGH (ref 0–44)
AST: 49 U/L — ABNORMAL HIGH (ref 15–41)
Albumin: 4.5 g/dL (ref 3.5–5.0)
Alkaline Phosphatase: 96 U/L (ref 38–126)
Anion gap: 11 (ref 5–15)
BUN: 11 mg/dL (ref 8–23)
CO2: 21 mmol/L — ABNORMAL LOW (ref 22–32)
Calcium: 9.2 mg/dL (ref 8.9–10.3)
Chloride: 104 mmol/L (ref 98–111)
Creatinine, Ser: 1.18 mg/dL (ref 0.61–1.24)
GFR, Estimated: >60 mL/min (ref >60)
Glucose, Bld: 108 mg/dL — ABNORMAL HIGH (ref 70–99)
Potassium: 4.5 mmol/L (ref 3.5–5.1)
Sodium: 136 mmol/L (ref 135–145)
Total Bilirubin: 0.6 mg/dL (ref 0.0–1.2)
Total Protein: 8.4 g/dL — ABNORMAL HIGH (ref 6.5–8.1)

## 2024-01-20 LAB — CBC WITH DIFFERENTIAL/PLATELET
Abs Immature Granulocytes: 0.02 10*3/uL (ref 0.00–0.07)
Basophils Absolute: 0 10*3/uL (ref 0.0–0.1)
Basophils Relative: 0 %
Eosinophils Absolute: 0 10*3/uL (ref 0.0–0.5)
Eosinophils Relative: 0 %
HCT: 44.5 % (ref 39.0–52.0)
Hemoglobin: 14.8 g/dL (ref 13.0–17.0)
Immature Granulocytes: 0 %
Lymphocytes Relative: 9 %
Lymphs Abs: 0.9 10*3/uL (ref 0.7–4.0)
MCH: 28.3 pg (ref 26.0–34.0)
MCHC: 33.3 g/dL (ref 30.0–36.0)
MCV: 85.1 fL (ref 80.0–100.0)
Monocytes Absolute: 0.8 10*3/uL (ref 0.1–1.0)
Monocytes Relative: 8 %
Neutro Abs: 7.6 10*3/uL (ref 1.7–7.7)
Neutrophils Relative %: 83 %
Platelets: 107 10*3/uL — ABNORMAL LOW (ref 150–400)
RBC: 5.23 MIL/uL (ref 4.22–5.81)
RDW: 14.2 % (ref 11.5–15.5)
WBC: 9.2 10*3/uL (ref 4.0–10.5)
nRBC: 0 % (ref 0.0–0.2)

## 2024-01-20 LAB — ETHANOL: Alcohol, Ethyl (B): <10 mg/dL (ref <10)

## 2024-01-20 LAB — CBG MONITORING, ED
Glucose-Capillary: 66 mg/dL — ABNORMAL LOW (ref 70–99)
Glucose-Capillary: 116 mg/dL — ABNORMAL HIGH (ref 70–99)

## 2024-01-20 LAB — MAGNESIUM: Magnesium: 2.4 mg/dL (ref 1.7–2.4)

## 2024-01-20 LAB — PROTIME-INR
INR: 1 (ref 0.8–1.2)
Prothrombin Time: 13.4 s (ref 11.4–15.2)

## 2024-01-20 MED ORDER — IBUPROFEN 800 MG PO TABS
800.0000 mg | ORAL_TABLET | Freq: Once | ORAL | Status: AC
  Administered 2024-01-20: 800 mg via ORAL
  Filled 2024-01-20: qty 1

## 2024-01-20 MED ORDER — LEVETIRACETAM IN NACL 1000 MG/100ML IV SOLN
1000.0000 mg | Freq: Once | INTRAVENOUS | Status: AC
  Administered 2024-01-20: 1000 mg via INTRAVENOUS
  Filled 2024-01-20: qty 100

## 2024-01-20 NOTE — ED Notes (Signed)
Patient resting in bed breathing eyes closed

## 2024-01-20 NOTE — ED Notes (Signed)
Dead bed bug found in EMS stretcher after moving patient in Wheelchair.

## 2024-01-20 NOTE — ED Notes (Signed)
Patient BS 66. Orange juice provided to patient as witnessed by Provider Fayrene Helper

## 2024-01-20 NOTE — Discharge Instructions (Signed)
Your blood sugar is low today, please eat and drink appropriately.  Please make sure to take your Keppra as prescribed and follow-up closely with your neurologist for further care.

## 2024-01-20 NOTE — ED Notes (Signed)
Pt needs to be showered ASAP and all belongings bagged in red bio bags per charge. Bed bug is caught in UA container in back of wheelchair.

## 2024-01-20 NOTE — ED Notes (Signed)
PTAR called for patient to be transported back home

## 2024-01-20 NOTE — ED Notes (Signed)
EKG completed

## 2024-01-20 NOTE — ED Provider Notes (Signed)
[place] EMERGENCY DEPARTMENT AT [facility] Provider Note   CSN: [id number] Arrival date & time: 01/20/24  1835     History  Chief Complaint  Patient presents with   Seizures    Jeffrey Mullen is a 73 y.o. male.  The history is provided by the patient, the EMS personnel and medical records. No language interpreter was used.  Seizures    73 year old male with significant history of seizure on Keppra, alcohol abuse, liver cirrhosis, anxiety, anemia, depression, blindness, hypertension, brought here via EMS from home with concerns of seizure.  According to family patient was on the floor on a blanket running around and they were worried that he may have had a seizure episode.  Patient however states that he did not have a seizure and he was just feeling a bit dizzy.  He denies any significant pain except some throbbing headache on the right side.  Denies any nausea vomiting or diarrhea no chest pain no shortness of breath no urinary symptoms no fever or chills.  He reports last alcohol use was 4 days ago.  Denies feeling tremulous.  Staff noted that patient has bedbugs when they were changing him out.  Home Medications Prior to Admission medications   Medication Sig Start Date End Date Taking? Authorizing Provider  atropine 1 % ophthalmic solution Place 1 drop into both eyes in the morning and at bedtime.    [provider]  folic acid (FOLVITE) 1 MG tablet Take 1 tablet (1 mg total) by mouth daily. 10/14/23   Hongalgi, Maximino Greenland, MD  lactulose (CHRONULAC) 10 GM/15ML solution Take 30 mLs (20 g total) by mouth 3 (three) times daily. Titrate dose to achieve 3 bowel movements per day. 10/13/23   Hongalgi, Maximino Greenland, MD  levETIRAcetam (KEPPRA) 500 MG tablet Take 1 tablet (500 mg total) by mouth 2 (two) times daily. 10/13/23   Hongalgi, Maximino Greenland, MD  Multiple Vitamin (MULTIVITAMIN WITH MINERALS) TABS tablet Take 1 tablet by mouth daily. 10/14/23   Hongalgi, Maximino Greenland, MD   thiamine (VITAMIN B-1) 100 MG tablet Take 1 tablet (100 mg total) by mouth daily. 10/14/23   Hongalgi, Maximino Greenland, MD      Allergies    Tylenol [acetaminophen]    Review of Systems   Review of Systems  Neurological:  Positive for seizures.  All other systems reviewed and are negative.   Physical Exam Updated Vital Signs BP (!) 155/89 (BP Location: Left Arm)   Pulse 65   Temp 98.2 F (36.8 C) (Oral)   Resp 15   Ht 5\' 11"  (1.803 m)   Wt 73 kg   SpO2 96%   BMI 22.45 kg/m  Physical Exam Vitals and nursing note reviewed.  Constitutional:      General: He is not in acute distress.    Appearance: He is well-developed.     Comments: Elderly male laying in bed appears to be in no acute discomfort.  HENT:     Head: Atraumatic.  Eyes:     Conjunctiva/sclera: Conjunctivae normal.     Comments: Left eye with opaque lens, chronic from blindness.  Neck:     Comments: No signs of scalp tenderness no cervical spine tenderness.  Neck with full range of motion. Cardiovascular:     Rate and Rhythm: Normal rate and regular rhythm.     Pulses: Normal pulses.     Heart sounds: Normal heart sounds.  Pulmonary:     Effort: Pulmonary effort is  normal.     Breath sounds: Normal breath sounds.  Abdominal:     Palpations: Abdomen is soft.  Musculoskeletal:     Cervical back: Normal range of motion and neck supple.     Comments: Able to move all 4 extremities with decreased effort left upper and lower extremity compared to right  Skin:    Findings: No rash.  Neurological:     Mental Status: He is alert and oriented to person, place, and time.  Psychiatric:        Mood and Affect: Mood normal.     ED Results / Procedures / Treatments   Labs (all labs ordered are listed, but only abnormal results are displayed) Labs Reviewed  COMPREHENSIVE METABOLIC PANEL - Abnormal; Notable for the following components:      Result Value   CO2 21 (*)    Glucose, Bld 108 (*)    Total Protein 8.4 (*)     AST 49 (*)    ALT 50 (*)    All other components within normal limits  CBC WITH DIFFERENTIAL/PLATELET - Abnormal; Notable for the following components:   Platelets 107 (*)    All other components within normal limits  CBG MONITORING, ED - Abnormal; Notable for the following components:   Glucose-Capillary 66 (*)    All other components within normal limits  ETHANOL  MAGNESIUM  PROTIME-INR  CBC WITH DIFFERENTIAL/PLATELET  RAPID URINE DRUG SCREEN, HOSP PERFORMED  URINALYSIS, ROUTINE W REFLEX MICROSCOPIC  LEVETIRACETAM LEVEL  AMMONIA    EKG None ED ECG REPORT   Date: 01/20/2024  Rate: 64  Rhythm: normal sinus rhythm  QRS Axis: normal  Intervals: PR prolonged  ST/T Wave abnormalities: nonspecific ST changes  Conduction Disutrbances:none  Narrative Interpretation:   Old EKG Reviewed: unchanged  I have personally reviewed the EKG tracing and agree with the computerized printout as noted.   Radiology No results found.  Procedures Procedures    Medications Ordered in ED Medications  levETIRAcetam (KEPPRA) IVPB 1000 mg/100 mL premix (has no administration in time range)  ibuprofen (ADVIL) tablet 800 mg (800 mg Oral Given 01/20/24 2036)    ED Course/ Medical Decision Making/ A&P                                 Medical Decision Making Amount and/or Complexity of Data Reviewed Labs: ordered.  Risk Prescription drug management.   BP (!) 155/89 (BP Location: Left Arm)   Pulse 65   Temp 98.2 F (36.8 C) (Oral)   Resp 15   Ht 5\' 11"  (1.803 m)   Wt 73 kg   SpO2 96%   BMI 22.45 kg/m   61:37 PM 73 year old male with significant history of seizure on Keppra, alcohol abuse, liver cirrhosis, anxiety, anemia, depression, blindness, hypertension, brought here via EMS from home with concerns of seizure.  According to family patient was on the floor on a blanket running around and they were worried that he may have had a seizure episode.  Patient however states that he  did not have a seizure and he was just feeling a bit dizzy.  He denies any significant pain except some throbbing headache on the right side.  Denies any nausea vomiting or diarrhea no chest pain no shortness of breath no urinary symptoms no fever or chills.  He reports last alcohol use was 4 days ago.  Denies feeling tremulous.  Staff noted that  patient has bedbugs when they were changing him out.  Exam overall reassuring, patient is alert and oriented x 4.  He is in no acute discomfort.  He seems to have slightly decreased strength on the left upper and lower extremity compared to right.  He also has signs of bedbugs.  He does not have any signs of trauma.  Vitals are notable for elevated blood pressure of 155/89.  Patient is afebrile no hypoxia.  -Labs ordered, independently viewed and interpreted by me.  Labs remarkable for reassuring labs. CBG 66, improves with orange juice.  Repeat CBG 116 -The patient was maintained on a cardiac monitor.  I personally viewed and interpreted the cardiac monitored which showed an underlying rhythm of: NSR -Imaging including head CT and brain MRI considered but low suspicion for space occupying lesion or acute stroke -This patient presents to the ED for concern of AMS, this involves an extensive number of treatment options, and is a complaint that carries with it a high risk of complications and morbidity.  The differential diagnosis includes seizure, alcohol withdrawal, hypoglycemia, infection -Co morbidities that complicate the patient evaluation includes seizure, alcohol abuse, liver cirrhosis -Treatment includes keppra -Reevaluation of the patient after these medicines showed that the patient improved -PCP office notes or outside notes reviewed -Discussion with attending Dr. Rhunette Croft -Escalation to admission/observation considered: patients feels much better, is comfortable with discharge, and will follow up with PCP -Prescription medication considered,  patient comfortable with resuming his keppra. -Social Determinant of Health considered which includes tobacco use.    10:02 PM Labs overall reassuring.  Patient is mentating appropriately.  Patient received a loading dose of Keppra here but otherwise I have low suspicion for new stroke, seizure, or other acute emergent medical condition.  Patient is stable for discharge and outpatient follow-up with neurology recommended.         Final Clinical Impression(s) / ED Diagnoses Final diagnoses:  Seizure-like activity [facility])    Rx / DC Orders ED Discharge Orders     None         Fayrene Helper, PA-C 01/20/24 2203    Derwood Kaplan, MD 01/21/24 1556

## 2024-01-20 NOTE — ED Triage Notes (Incomplete)
Patient BIB EMS from home c/o unwitness seizures. Per report family was found rolled in his blanket in the floor. EMS stated patient is a/ox4 upon arrival. Per family patient non compliant with medication.  BP 164/80 HR 70 RR 20 O2sat 95%on RA CBG 142

## 2024-01-20 NOTE — ED Notes (Signed)
PTAR here to transport patient back home

## 2024-01-20 NOTE — ED Provider Triage Note (Signed)
Emergency Medicine Provider Triage Evaluation Note  Jeffrey Mullen , a 72 y.o. male  was evaluated in triage.  Pt complains of unwitnessed seizure.  According to family patient was on the floor as blanket rolling around however patient states that he he did not have a seizure and only had nausea.  Patient does not remember these events.  Patient does have history of noncompliance with his Keppra.  Patient denies any injury to the head or neck or any pain.  Patient denies any abdominal pain, emesis, fevers, dysuria..  Review of Systems  Positive:  Negative:   Physical Exam  BP (!) 155/89 (BP Location: Left Arm)   Pulse 65   Temp 98.2 F (36.8 C) (Oral)   Resp 15   Ht 5\' 11"  (1.803 m)   Wt 73 kg   SpO2 96%   BMI 22.45 kg/m  Gen:   Awake, no distress   Resp:  Normal effort  MSK:   Moves extremities without difficulty  Other:    Medical Decision Making  Medically screening exam initiated at 7:08 PM.  Appropriate orders placed.  Carlisle Beers was informed that the remainder of the evaluation will be completed by another provider, this initial triage assessment does not replace that evaluation, and the importance of remaining in the ED until their evaluation is complete.  Workup initiated, patient denied any seizure however given EMS states that family found him having seizure we will get seizure workup, patient stable this time.   Netta Corrigan, PA-C 01/20/24 1910

## 2024-01-21 DIAGNOSIS — G40909 Epilepsy, unspecified, not intractable, without status epilepticus: Secondary | ICD-10-CM | POA: Diagnosis not present

## 2024-01-21 DIAGNOSIS — Z7401 Bed confinement status: Secondary | ICD-10-CM | POA: Diagnosis not present

## 2024-01-21 DIAGNOSIS — R531 Weakness: Secondary | ICD-10-CM | POA: Diagnosis not present

## 2024-01-21 DIAGNOSIS — Z743 Need for continuous supervision: Secondary | ICD-10-CM | POA: Diagnosis not present

## 2024-01-21 NOTE — ED Notes (Signed)
Pt dressed and VS updated. Pt had no complaints. Waiting for PTAR.

## 2024-01-21 NOTE — ED Notes (Signed)
PTAR called and advised in the next 30-45 minutes.

## 2024-01-21 NOTE — ED Notes (Addendum)
Patient brought back to ER by PTAR. Per PTAR Patient thought his keys were in his bag and they were not there when he got home. Patient has no key to get into his house at this time.  Per PTAR  they knocked on the door pretty hard and no response. PTART stated a TV was on and loud.

## 2024-01-21 NOTE — ED Notes (Signed)
Called housing authority and it went to VM. I will call again shortly. Pt stated he can't find his keys and no one is in town to get him in the house. Pt next of kin contacted and she stated she did not have access to his apartment. He stated the only one that would be able to let him in is the apartment Production designer, theatre/television/film.

## 2024-01-21 NOTE — ED Notes (Signed)
VM left for patients apartment manager.

## 2024-01-22 LAB — LEVETIRACETAM LEVEL: Levetiracetam Lvl: <2 ug/mL — ABNORMAL LOW (ref 10.0–40.0)

## 2024-05-02 DIAGNOSIS — R404 Transient alteration of awareness: Secondary | ICD-10-CM | POA: Diagnosis not present

## 2024-05-02 DIAGNOSIS — I499 Cardiac arrhythmia, unspecified: Secondary | ICD-10-CM | POA: Diagnosis not present

## 2024-05-02 DIAGNOSIS — R6889 Other general symptoms and signs: Secondary | ICD-10-CM | POA: Diagnosis not present

## 2024-05-02 DIAGNOSIS — R569 Unspecified convulsions: Secondary | ICD-10-CM | POA: Diagnosis not present

## 2024-05-03 ENCOUNTER — Emergency Department (HOSPITAL_COMMUNITY)
Admission: EM | Admit: 2024-05-03 | Discharge: 2024-05-03 | Disposition: A | Discharge status: Home / Self Care | Attending: Emergency Medicine | Admitting: Emergency Medicine

## 2024-05-03 ENCOUNTER — Encounter (HOSPITAL_COMMUNITY): Payer: Self-pay

## 2024-05-03 ENCOUNTER — Other Ambulatory Visit: Payer: Self-pay

## 2024-05-03 DIAGNOSIS — R569 Unspecified convulsions: Secondary | ICD-10-CM | POA: Diagnosis not present

## 2024-05-03 DIAGNOSIS — I10 Essential (primary) hypertension: Secondary | ICD-10-CM | POA: Diagnosis not present

## 2024-05-03 LAB — CBC WITH DIFFERENTIAL/PLATELET
Abs Immature Granulocytes: 0.03 10*3/uL (ref 0.00–0.07)
Basophils Absolute: 0 10*3/uL (ref 0.0–0.1)
Basophils Relative: 0 %
Eosinophils Absolute: 0.1 10*3/uL (ref 0.0–0.5)
Eosinophils Relative: 1 %
HCT: 41.6 % (ref 39.0–52.0)
Hemoglobin: 13.6 g/dL (ref 13.0–17.0)
Immature Granulocytes: 1 %
Lymphocytes Relative: 39 %
Lymphs Abs: 2.1 10*3/uL (ref 0.7–4.0)
MCH: 28.6 pg (ref 26.0–34.0)
MCHC: 32.7 g/dL (ref 30.0–36.0)
MCV: 87.6 fL (ref 80.0–100.0)
Monocytes Absolute: 0.5 10*3/uL (ref 0.1–1.0)
Monocytes Relative: 10 %
Neutro Abs: 2.6 10*3/uL (ref 1.7–7.7)
Neutrophils Relative %: 49 %
Platelets: 234 10*3/uL (ref 150–400)
RBC: 4.75 MIL/uL (ref 4.22–5.81)
RDW: 14.4 % (ref 11.5–15.5)
WBC: 5.4 10*3/uL (ref 4.0–10.5)
nRBC: 0 % (ref 0.0–0.2)

## 2024-05-03 LAB — BASIC METABOLIC PANEL WITH GFR
Anion gap: 12 (ref 5–15)
BUN: 9 mg/dL (ref 8–23)
CO2: 26 mmol/L (ref 22–32)
Calcium: 9.2 mg/dL (ref 8.9–10.3)
Chloride: 103 mmol/L (ref 98–111)
Creatinine, Ser: 1.27 mg/dL — ABNORMAL HIGH (ref 0.61–1.24)
GFR, Estimated: >60 mL/min (ref >60)
Glucose, Bld: 111 mg/dL — ABNORMAL HIGH (ref 70–99)
Potassium: 4.1 mmol/L (ref 3.5–5.1)
Sodium: 141 mmol/L (ref 135–145)

## 2024-05-03 LAB — TROPONIN I (HIGH SENSITIVITY): Troponin I (High Sensitivity): 15 ng/L (ref <18)

## 2024-05-03 LAB — ETHANOL: Alcohol, Ethyl (B): <15 mg/dL (ref <15)

## 2024-05-03 MED ORDER — LEVETIRACETAM 500 MG PO TABS
500.0000 mg | ORAL_TABLET | Freq: Once | ORAL | Status: AC
  Administered 2024-05-03: 500 mg via ORAL
  Filled 2024-05-03: qty 1

## 2024-05-03 NOTE — ED Triage Notes (Signed)
 PT arrived Via EMS. PT had seizure like activity per his roommate. PT states he takes keppra  but hasn't taken it today. PT is alert and talking PT states he remembers having a seizure and states he did not urinate on himself. PT VSS besides BP elevated to 190/159 PT states he has a HX of HTN PT sattes he did not tak his BP meds.

## 2024-05-03 NOTE — Discharge Instructions (Signed)
You have been seen in the emergency department today for a seizure.  Your workup today including labs are within normal limits.  Please follow up with your doctor/neurologist as soon as possible regarding today's emergency department visit and your likely seizure. ° °As we have discussed it is very important that you do not drive until you have been seen and cleared by your neurologist. ° °Please drink plenty of fluids, get plenty of sleep and avoid any alcohol or drug use °Please return to the emergency department if you have any further seizures which do not respond to medications, or for any other symptoms per se concerning for yourself. ° °

## 2024-05-03 NOTE — ED Provider Notes (Signed)
 Emergency Department Provider Note   I have reviewed the triage vital signs and the nursing notes.   HISTORY  Chief Complaint Seizures   HPI Jeffrey Mullen is a 73 y.o. male with history of seizures and poor med compliance presents to the emergency department with breakthrough seizure witnessed by his roommate.  Patient had a fairly brief generalized tonic-clonic seizure.  He has since returned to his mental status baseline.  No medication given by EMS.  He was noted to be hypertensive and states that over the past couple days he has not taken any of his seizure or hypertension medications. Denies any tongue pain, shoulder pain, or other symptoms at this time. He continues to drink EtOH including a beer earlier this evening. Denies stopping or cutting back significantly.    Past Medical History:  Diagnosis Date   Alcohol abuse    Allergy    Anemia    Anxiety    Arthritis    Blind left eye    Cataract, right eye    Congenital blindness    LEFT EYE   Depression    Glaucoma, bilateral    Hepatic cirrhosis (HCC)    Hepatitis    HEP C treated in 2016   HTN (hypertension)    Seizures (HCC)    3 years ago    Review of Systems  Constitutional: No fever/chills Cardiovascular: Denies chest pain. Respiratory: Denies shortness of breath. Gastrointestinal: No abdominal pain.  No nausea, no vomiting.  Skin: Negative for rash. Neurological: Negative for headaches. Positive breakthrough seizure.   ____________________________________________   PHYSICAL EXAM:  VITAL SIGNS: ED Triage Vitals [05/03/24 0036]  Encounter Vitals Group     BP (!) 190/159     Pulse Rate (!) 58     Resp 20     Temp 98.3 F (36.8 C)     SpO2 99 %     Weight 170 lb (77.1 kg)     Height 5\' 11"  (1.803 m)   Constitutional: Alert and oriented. Well appearing and in no acute distress. Eyes: Conjunctivae are normal. Opacification of the left eye (baseline).  Head: Atraumatic. Nose: No  congestion/rhinnorhea. Mouth/Throat: Mucous membranes are moist.  Oropharynx non-erythematous. No tongue injury.  Neck: No stridor.   Cardiovascular: Normal rate, regular rhythm. Good peripheral circulation. Grossly normal heart sounds.   Respiratory: Normal respiratory effort.  No retractions. Lungs CTAB. Gastrointestinal: Soft and nontender. No distention.  Musculoskeletal: No lower extremity tenderness nor edema. No gross deformities of extremities. Normal ROM of the bilateral shoulders.  Neurologic:  Normal speech and language. No gross focal neurologic deficits are appreciated.  Skin:  Skin is warm, dry and intact. No rash noted.  ____________________________________________   LABS (all labs ordered are listed, but only abnormal results are displayed)  Labs Reviewed  BASIC METABOLIC PANEL WITH GFR - Abnormal; Notable for the following components:      Result Value   Glucose, Bld 111 (*)    Creatinine, Ser 1.27 (*)    All other components within normal limits  CBC WITH DIFFERENTIAL/PLATELET  ETHANOL  TROPONIN I (HIGH SENSITIVITY)   ____________________________________________  EKG   EKG Interpretation Date/Time:  Sunday May 03 2024 00:33:09 EDT Ventricular Rate:  55 PR Interval:  179 QRS Duration:  78 QT Interval:  434 QTC Calculation: 416 R Axis:   59  Text Interpretation: Sinus rhythm Anteroseptal infarct, old T wave changes noted. New from prior. Confirmed by Abby Hocking [phone]) on 05/03/2024 12:41:22 AM  ____________________________________________   PROCEDURES  Procedure(s) performed:   Procedures  None  ____________________________________________   INITIAL IMPRESSION / ASSESSMENT AND PLAN / ED COURSE  Pertinent labs & imaging results that were available during my care of the patient were reviewed by me and considered in my medical decision making (see chart for details).   This patient is Presenting for Evaluation of seizure activity, which  does require a range of treatment options, and is a complaint that involves a high risk of morbidity and mortality.  The Differential Diagnoses include provoked breakthrough seizure, status epilepticus, EtOH withdrawal, etc.  Critical Interventions-    Medications  levETIRAcetam  (KEPPRA ) tablet 500 mg (500 mg Oral Given 05/03/24 0114)    Reassessment after intervention: no AMS of additional seizures.   Clinical Laboratory Tests Ordered, included EtOH negative. Troponin normal. No AKI. No anemia.   Radiologic Tests: Patient with [name] history of seizure and med non-compliance. No prolonged post-ictal period, trauma, or other findings to strongly support obtaining neuro imaging.   Cardiac Monitor Tracing which shows NSR.    Social Determinants of Health Risk positive EtOH use.   Medical Decision Making: Summary:  Patient with breakthrough seizure. He has missed keppra  dosing the last 2 days. No evidence on my exam to strongly suspect EtOH withdrawal. He is awake, alert, and conversational. Plan for screening labs, PO keppra , and likely d/c if workup is reassuring. EKG shows new T wave inversions compared to prior. No active CP or other anginal equivalent on history but will obtain a single troponin in this setting.   Reevaluation with update and discussion with patient. Labs are reassuring. Observed in the ED without deterioration of mental status. No additional seizures. Plan for d/c.   Patient's presentation is most consistent with acute presentation with potential threat to life or bodily function.   Disposition: discharge  ____________________________________________  FINAL CLINICAL IMPRESSION(S) / ED DIAGNOSES  Final diagnoses:  Seizure (HCC)    Note:  This document was prepared using Dragon voice recognition software and may include unintentional dictation errors.  Abby Hocking, MD, [facility]    Shterna Laramee, Shereen Dike, MD 05/03/24 [phone]

## 2024-10-22 ENCOUNTER — Telehealth: Payer: Self-pay

## 2024-10-22 NOTE — Telephone Encounter (Signed)
 Called patient to get an appt with PCP scheduled. No answer, no VM available.

## 2024-11-05 ENCOUNTER — Telehealth: Payer: Self-pay

## 2024-11-05 NOTE — Telephone Encounter (Signed)
 Called patient to make an appt. No VM.
